# Patient Record
Sex: Female | Born: 1945
Health system: Southern US, Community
[De-identification: ages and names within clinical notes are randomized; demographics above are authoritative.]

## PROBLEM LIST (undated history)

## (undated) DIAGNOSIS — I69391 Dysphagia following cerebral infarction: Secondary | ICD-10-CM

## (undated) DIAGNOSIS — E876 Hypokalemia: Secondary | ICD-10-CM

## (undated) DIAGNOSIS — I10 Essential (primary) hypertension: Secondary | ICD-10-CM

## (undated) DIAGNOSIS — I4891 Unspecified atrial fibrillation: Secondary | ICD-10-CM

## (undated) DIAGNOSIS — M1990 Unspecified osteoarthritis, unspecified site: Secondary | ICD-10-CM

## (undated) DIAGNOSIS — I69398 Other sequelae of cerebral infarction: Secondary | ICD-10-CM

## (undated) DIAGNOSIS — R269 Unspecified abnormalities of gait and mobility: Secondary | ICD-10-CM

## (undated) DIAGNOSIS — G35 Multiple sclerosis: Secondary | ICD-10-CM

## (undated) DIAGNOSIS — Z86718 Personal history of other venous thrombosis and embolism: Secondary | ICD-10-CM

## (undated) DIAGNOSIS — M8580 Other specified disorders of bone density and structure, unspecified site: Secondary | ICD-10-CM

## (undated) DIAGNOSIS — G8191 Hemiplegia, unspecified affecting right dominant side: Secondary | ICD-10-CM

## (undated) DIAGNOSIS — I119 Hypertensive heart disease without heart failure: Secondary | ICD-10-CM

## (undated) DIAGNOSIS — J9601 Acute respiratory failure with hypoxia: Secondary | ICD-10-CM

## (undated) DIAGNOSIS — M5030 Other cervical disc degeneration, unspecified cervical region: Secondary | ICD-10-CM

## (undated) DIAGNOSIS — Z9861 Coronary angioplasty status: Secondary | ICD-10-CM

## (undated) DIAGNOSIS — D649 Anemia, unspecified: Secondary | ICD-10-CM

## (undated) DIAGNOSIS — E7800 Pure hypercholesterolemia, unspecified: Secondary | ICD-10-CM

## (undated) DIAGNOSIS — R0989 Other specified symptoms and signs involving the circulatory and respiratory systems: Secondary | ICD-10-CM

## (undated) DIAGNOSIS — I480 Paroxysmal atrial fibrillation: Secondary | ICD-10-CM

## (undated) DIAGNOSIS — I639 Cerebral infarction, unspecified: Secondary | ICD-10-CM

## (undated) DIAGNOSIS — I2510 Atherosclerotic heart disease of native coronary artery without angina pectoris: Secondary | ICD-10-CM

## (undated) DIAGNOSIS — D72829 Elevated white blood cell count, unspecified: Secondary | ICD-10-CM

## (undated) DIAGNOSIS — K635 Polyp of colon: Secondary | ICD-10-CM

## (undated) HISTORY — DX: Unspecified atrial fibrillation: I48.91

## (undated) HISTORY — PX: WRIST SURGERY: SHX841

## (undated) HISTORY — DX: Personal history of other venous thrombosis and embolism: Z86.718

## (undated) HISTORY — DX: Other specified disorders of bone density and structure, unspecified site: M85.80

## (undated) HISTORY — DX: Polyp of colon: K63.5

## (undated) HISTORY — DX: Essential (primary) hypertension: I10

## (undated) HISTORY — DX: Other cervical disc degeneration, unspecified cervical region: M50.30

## (undated) HISTORY — DX: Other specified symptoms and signs involving the circulatory and respiratory systems: R09.89

## (undated) HISTORY — DX: Elevated white blood cell count, unspecified: D72.829

## (undated) HISTORY — DX: Unspecified osteoarthritis, unspecified site: M19.90

## (undated) HISTORY — DX: Anemia, unspecified: D64.9

## (undated) HISTORY — DX: Acute respiratory failure with hypoxia: J96.01

## (undated) HISTORY — DX: Unspecified abnormalities of gait and mobility: R26.9

## (undated) HISTORY — DX: Other sequelae of cerebral infarction: I69.398

## (undated) HISTORY — DX: Hypokalemia: E87.6

## (undated) HISTORY — PX: NECK SURGERY: SHX720

## (undated) HISTORY — DX: Dysphagia following cerebral infarction: I69.391

## (undated) HISTORY — DX: Hemiplegia, unspecified affecting right dominant side: G81.91

---

## 1998-10-17 ENCOUNTER — Other Ambulatory Visit: Admission: RE | Admit: 1998-10-17 | Discharge: 1998-10-17 | Payer: Self-pay | Admitting: Obstetrics and Gynecology

## 1999-08-03 DIAGNOSIS — I2510 Atherosclerotic heart disease of native coronary artery without angina pectoris: Secondary | ICD-10-CM

## 1999-08-03 HISTORY — DX: Atherosclerotic heart disease of native coronary artery without angina pectoris: I25.10

## 1999-11-01 HISTORY — PX: CORONARY ANGIOPLASTY WITH STENT PLACEMENT: SHX49

## 1999-11-29 ENCOUNTER — Encounter: Payer: Self-pay | Admitting: Emergency Medicine

## 1999-11-29 ENCOUNTER — Emergency Department (HOSPITAL_COMMUNITY): Admission: EM | Admit: 1999-11-29 | Discharge: 1999-11-29 | Payer: Self-pay | Admitting: Emergency Medicine

## 2000-03-17 ENCOUNTER — Other Ambulatory Visit: Admission: RE | Admit: 2000-03-17 | Discharge: 2000-03-17 | Payer: Self-pay | Admitting: Obstetrics and Gynecology

## 2001-01-30 ENCOUNTER — Other Ambulatory Visit: Admission: RE | Admit: 2001-01-30 | Discharge: 2001-01-30 | Payer: Self-pay | Admitting: Obstetrics and Gynecology

## 2001-04-14 ENCOUNTER — Encounter (INDEPENDENT_AMBULATORY_CARE_PROVIDER_SITE_OTHER): Payer: Self-pay | Admitting: Specialist

## 2001-04-14 ENCOUNTER — Ambulatory Visit (HOSPITAL_COMMUNITY): Admission: RE | Admit: 2001-04-14 | Discharge: 2001-04-14 | Payer: Self-pay | Admitting: Gastroenterology

## 2003-12-03 ENCOUNTER — Other Ambulatory Visit: Admission: RE | Admit: 2003-12-03 | Discharge: 2003-12-03 | Payer: Self-pay | Admitting: Internal Medicine

## 2005-02-05 ENCOUNTER — Emergency Department (HOSPITAL_COMMUNITY): Admission: EM | Admit: 2005-02-05 | Discharge: 2005-02-05 | Payer: Self-pay | Admitting: Emergency Medicine

## 2006-01-10 ENCOUNTER — Other Ambulatory Visit: Admission: RE | Admit: 2006-01-10 | Discharge: 2006-01-10 | Payer: Self-pay | Admitting: Internal Medicine

## 2006-07-22 ENCOUNTER — Ambulatory Visit (HOSPITAL_COMMUNITY): Admission: RE | Admit: 2006-07-22 | Discharge: 2006-07-22 | Payer: Self-pay | Admitting: *Deleted

## 2009-03-19 ENCOUNTER — Ambulatory Visit (HOSPITAL_COMMUNITY): Admission: RE | Admit: 2009-03-19 | Discharge: 2009-03-19 | Payer: Self-pay | Admitting: Internal Medicine

## 2012-04-26 ENCOUNTER — Encounter (HOSPITAL_COMMUNITY): Payer: Self-pay | Admitting: Family Medicine

## 2012-04-26 ENCOUNTER — Emergency Department (HOSPITAL_COMMUNITY): Payer: Medicare Other

## 2012-04-26 ENCOUNTER — Emergency Department (HOSPITAL_COMMUNITY)
Admission: EM | Admit: 2012-04-26 | Discharge: 2012-04-26 | Disposition: A | Discharge status: Home / Self Care | Payer: Medicare Other | Attending: Emergency Medicine | Admitting: Emergency Medicine

## 2012-04-26 ENCOUNTER — Encounter (HOSPITAL_COMMUNITY): Payer: Self-pay | Admitting: Emergency Medicine

## 2012-04-26 ENCOUNTER — Emergency Department (INDEPENDENT_AMBULATORY_CARE_PROVIDER_SITE_OTHER)
Admission: EM | Admit: 2012-04-26 | Discharge: 2012-04-26 | Disposition: A | Discharge status: Nursing Home (Includes ICF) | Payer: Medicare Other | Source: Home / Self Care | Attending: Family Medicine | Admitting: Family Medicine

## 2012-04-26 DIAGNOSIS — M5030 Other cervical disc degeneration, unspecified cervical region: Secondary | ICD-10-CM | POA: Insufficient documentation

## 2012-04-26 DIAGNOSIS — R079 Chest pain, unspecified: Secondary | ICD-10-CM | POA: Insufficient documentation

## 2012-04-26 DIAGNOSIS — S301XXA Contusion of abdominal wall, initial encounter: Secondary | ICD-10-CM | POA: Insufficient documentation

## 2012-04-26 DIAGNOSIS — R51 Headache: Secondary | ICD-10-CM | POA: Insufficient documentation

## 2012-04-26 DIAGNOSIS — G35 Multiple sclerosis: Secondary | ICD-10-CM | POA: Insufficient documentation

## 2012-04-26 DIAGNOSIS — Z981 Arthrodesis status: Secondary | ICD-10-CM | POA: Insufficient documentation

## 2012-04-26 DIAGNOSIS — R0602 Shortness of breath: Secondary | ICD-10-CM

## 2012-04-26 DIAGNOSIS — M25569 Pain in unspecified knee: Secondary | ICD-10-CM | POA: Insufficient documentation

## 2012-04-26 DIAGNOSIS — S20219A Contusion of unspecified front wall of thorax, initial encounter: Secondary | ICD-10-CM

## 2012-04-26 DIAGNOSIS — Z79899 Other long term (current) drug therapy: Secondary | ICD-10-CM | POA: Insufficient documentation

## 2012-04-26 HISTORY — DX: Multiple sclerosis: G35

## 2012-04-26 HISTORY — DX: Pure hypercholesterolemia, unspecified: E78.00

## 2012-04-26 LAB — COMPREHENSIVE METABOLIC PANEL
AST: 35 U/L (ref 0–37)
Albumin: 4.3 g/dL (ref 3.5–5.2)
BUN: 19 mg/dL (ref 6–23)
Calcium: 10.3 mg/dL (ref 8.4–10.5)
Chloride: 100 mEq/L (ref 96–112)
Creatinine, Ser: 0.78 mg/dL (ref 0.50–1.10)
Total Bilirubin: 0.3 mg/dL (ref 0.3–1.2)
Total Protein: 7.5 g/dL (ref 6.0–8.3)

## 2012-04-26 LAB — TROPONIN I: Troponin I: <0.3 ng/mL (ref <0.30)

## 2012-04-26 LAB — CBC WITH DIFFERENTIAL/PLATELET
Basophils Absolute: 0 10*3/uL (ref 0.0–0.1)
Basophils Relative: 0 % (ref 0–1)
Eosinophils Absolute: 0.1 10*3/uL (ref 0.0–0.7)
Eosinophils Relative: 1 % (ref 0–5)
HCT: 36.5 % (ref 36.0–46.0)
Hemoglobin: 12.5 g/dL (ref 12.0–15.0)
MCH: 32.2 pg (ref 26.0–34.0)
MCHC: 34.2 g/dL (ref 30.0–36.0)
MCV: 94.1 fL (ref 78.0–100.0)
Monocytes Absolute: 0.6 10*3/uL (ref 0.1–1.0)
Monocytes Relative: 9 % (ref 3–12)
Neutro Abs: 3.8 10*3/uL (ref 1.7–7.7)
RDW: 12.7 % (ref 11.5–15.5)
WBC: 6.4 10*3/uL (ref 4.0–10.5)

## 2012-04-26 MED ORDER — IOHEXOL 300 MG/ML  SOLN
100.0000 mL | Freq: Once | INTRAMUSCULAR | Status: AC | PRN
  Administered 2012-04-26: 100 mL via INTRAVENOUS

## 2012-04-26 MED ORDER — CETIRIZINE-PSEUDOEPHEDRINE ER 5-120 MG PO TB12: 1.0000 | ORAL_TABLET | Freq: Every day | ORAL | Status: DC

## 2012-04-26 MED ORDER — TRAMADOL HCL 50 MG PO TABS: 50.0000 mg | ORAL_TABLET | Freq: Four times a day (QID) | ORAL | Status: DC | PRN
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
## 2012-04-26 NOTE — ED Notes (Signed)
Family at bedside. 

## 2012-04-26 NOTE — ED Notes (Signed)
Brought husband -Kaitlin [name] from lobby to patient's room.
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
## 2012-04-27 NOTE — ED Provider Notes (Signed)
Medical screening examination/treatment/procedure(s) were performed by non-physician practitioner and as supervising physician I was immediately available for consultation/collaboration.   Shah Insley, MD 04/27/12 1419 

## 2013-11-07 DIAGNOSIS — I2510 Atherosclerotic heart disease of native coronary artery without angina pectoris: Secondary | ICD-10-CM | POA: Insufficient documentation

## 2015-11-03 ENCOUNTER — Other Ambulatory Visit: Payer: Self-pay | Admitting: Internal Medicine

## 2015-11-03 DIAGNOSIS — R748 Abnormal levels of other serum enzymes: Secondary | ICD-10-CM

## 2015-11-11 ENCOUNTER — Ambulatory Visit
Admission: RE | Admit: 2015-11-11 | Discharge: 2015-11-11 | Disposition: A | Discharge status: Home / Self Care | Payer: Medicare Other | Source: Ambulatory Visit | Attending: Internal Medicine | Admitting: Internal Medicine

## 2015-11-11 DIAGNOSIS — R748 Abnormal levels of other serum enzymes: Secondary | ICD-10-CM

## 2017-01-30 DIAGNOSIS — I119 Hypertensive heart disease without heart failure: Secondary | ICD-10-CM

## 2017-01-30 DIAGNOSIS — I480 Paroxysmal atrial fibrillation: Secondary | ICD-10-CM

## 2017-01-30 DIAGNOSIS — I639 Cerebral infarction, unspecified: Secondary | ICD-10-CM

## 2017-01-30 HISTORY — DX: Cerebral infarction, unspecified: I63.9

## 2017-01-30 HISTORY — DX: Hypertensive heart disease without heart failure: I11.9

## 2017-01-30 HISTORY — DX: Paroxysmal atrial fibrillation: I48.0

## 2017-02-14 ENCOUNTER — Emergency Department (HOSPITAL_COMMUNITY): Payer: Medicare Other

## 2017-02-14 ENCOUNTER — Encounter (HOSPITAL_COMMUNITY)
Admission: EM | Disposition: A | Discharge status: Rehabilitation Facility | Payer: Self-pay | Source: Home / Self Care | Attending: Neurology

## 2017-02-14 ENCOUNTER — Encounter (HOSPITAL_COMMUNITY): Payer: Self-pay

## 2017-02-14 ENCOUNTER — Emergency Department (HOSPITAL_COMMUNITY): Payer: Medicare Other | Admitting: Anesthesiology

## 2017-02-14 ENCOUNTER — Inpatient Hospital Stay (HOSPITAL_COMMUNITY)
Admission: EM | Admit: 2017-02-14 | Discharge: 2017-02-22 | DRG: 023 | Disposition: A | Discharge status: Rehabilitation Facility | Payer: Medicare Other | Attending: Neurology | Admitting: Neurology

## 2017-02-14 DIAGNOSIS — Z7982 Long term (current) use of aspirin: Secondary | ICD-10-CM

## 2017-02-14 DIAGNOSIS — I639 Cerebral infarction, unspecified: Secondary | ICD-10-CM | POA: Diagnosis present

## 2017-02-14 DIAGNOSIS — R0989 Other specified symptoms and signs involving the circulatory and respiratory systems: Secondary | ICD-10-CM

## 2017-02-14 DIAGNOSIS — Z978 Presence of other specified devices: Secondary | ICD-10-CM

## 2017-02-14 DIAGNOSIS — I480 Paroxysmal atrial fibrillation: Secondary | ICD-10-CM

## 2017-02-14 DIAGNOSIS — I4891 Unspecified atrial fibrillation: Secondary | ICD-10-CM | POA: Diagnosis not present

## 2017-02-14 DIAGNOSIS — I63512 Cerebral infarction due to unspecified occlusion or stenosis of left middle cerebral artery: Secondary | ICD-10-CM | POA: Diagnosis present

## 2017-02-14 DIAGNOSIS — E785 Hyperlipidemia, unspecified: Secondary | ICD-10-CM

## 2017-02-14 DIAGNOSIS — I69353 Hemiplegia and hemiparesis following cerebral infarction affecting right non-dominant side: Secondary | ICD-10-CM | POA: Diagnosis not present

## 2017-02-14 DIAGNOSIS — I2510 Atherosclerotic heart disease of native coronary artery without angina pectoris: Secondary | ICD-10-CM | POA: Diagnosis present

## 2017-02-14 DIAGNOSIS — M7120 Synovial cyst of popliteal space [Baker], unspecified knee: Secondary | ICD-10-CM | POA: Diagnosis present

## 2017-02-14 DIAGNOSIS — R269 Unspecified abnormalities of gait and mobility: Secondary | ICD-10-CM | POA: Diagnosis present

## 2017-02-14 DIAGNOSIS — G4700 Insomnia, unspecified: Secondary | ICD-10-CM | POA: Diagnosis present

## 2017-02-14 DIAGNOSIS — I69391 Dysphagia following cerebral infarction: Secondary | ICD-10-CM | POA: Diagnosis not present

## 2017-02-14 DIAGNOSIS — Z880 Allergy status to penicillin: Secondary | ICD-10-CM

## 2017-02-14 DIAGNOSIS — I119 Hypertensive heart disease without heart failure: Secondary | ICD-10-CM | POA: Diagnosis present

## 2017-02-14 DIAGNOSIS — I63232 Cerebral infarction due to unspecified occlusion or stenosis of left carotid arteries: Secondary | ICD-10-CM | POA: Diagnosis not present

## 2017-02-14 DIAGNOSIS — E782 Mixed hyperlipidemia: Secondary | ICD-10-CM | POA: Diagnosis not present

## 2017-02-14 DIAGNOSIS — Y848 Other medical procedures as the cause of abnormal reaction of the patient, or of later complication, without mention of misadventure at the time of the procedure: Secondary | ICD-10-CM | POA: Diagnosis present

## 2017-02-14 DIAGNOSIS — R7989 Other specified abnormal findings of blood chemistry: Secondary | ICD-10-CM | POA: Diagnosis not present

## 2017-02-14 DIAGNOSIS — Y92230 Patient room in hospital as the place of occurrence of the external cause: Secondary | ICD-10-CM | POA: Diagnosis not present

## 2017-02-14 DIAGNOSIS — J432 Centrilobular emphysema: Secondary | ICD-10-CM | POA: Diagnosis present

## 2017-02-14 DIAGNOSIS — R4789 Other speech disturbances: Secondary | ICD-10-CM | POA: Diagnosis present

## 2017-02-14 DIAGNOSIS — E874 Mixed disorder of acid-base balance: Secondary | ICD-10-CM | POA: Diagnosis present

## 2017-02-14 DIAGNOSIS — R001 Bradycardia, unspecified: Secondary | ICD-10-CM | POA: Diagnosis present

## 2017-02-14 DIAGNOSIS — I959 Hypotension, unspecified: Secondary | ICD-10-CM | POA: Diagnosis not present

## 2017-02-14 DIAGNOSIS — G8191 Hemiplegia, unspecified affecting right dominant side: Secondary | ICD-10-CM | POA: Diagnosis present

## 2017-02-14 DIAGNOSIS — R413 Other amnesia: Secondary | ICD-10-CM | POA: Diagnosis present

## 2017-02-14 DIAGNOSIS — I700 Atherosclerosis of aorta: Secondary | ICD-10-CM | POA: Diagnosis present

## 2017-02-14 DIAGNOSIS — D62 Acute posthemorrhagic anemia: Secondary | ICD-10-CM | POA: Diagnosis not present

## 2017-02-14 DIAGNOSIS — J9601 Acute respiratory failure with hypoxia: Secondary | ICD-10-CM | POA: Diagnosis not present

## 2017-02-14 DIAGNOSIS — I69919 Unspecified symptoms and signs involving cognitive functions following unspecified cerebrovascular disease: Secondary | ICD-10-CM | POA: Diagnosis not present

## 2017-02-14 DIAGNOSIS — I6300 Cerebral infarction due to thrombosis of unspecified precerebral artery: Secondary | ICD-10-CM | POA: Diagnosis not present

## 2017-02-14 DIAGNOSIS — I341 Nonrheumatic mitral (valve) prolapse: Secondary | ICD-10-CM | POA: Diagnosis not present

## 2017-02-14 DIAGNOSIS — E876 Hypokalemia: Secondary | ICD-10-CM | POA: Diagnosis present

## 2017-02-14 DIAGNOSIS — I350 Nonrheumatic aortic (valve) stenosis: Secondary | ICD-10-CM | POA: Diagnosis present

## 2017-02-14 DIAGNOSIS — Z955 Presence of coronary angioplasty implant and graft: Secondary | ICD-10-CM | POA: Diagnosis not present

## 2017-02-14 DIAGNOSIS — R4701 Aphasia: Secondary | ICD-10-CM | POA: Diagnosis present

## 2017-02-14 DIAGNOSIS — I63032 Cerebral infarction due to thrombosis of left carotid artery: Secondary | ICD-10-CM | POA: Diagnosis not present

## 2017-02-14 DIAGNOSIS — R2689 Other abnormalities of gait and mobility: Secondary | ICD-10-CM | POA: Diagnosis not present

## 2017-02-14 DIAGNOSIS — J690 Pneumonitis due to inhalation of food and vomit: Secondary | ICD-10-CM | POA: Diagnosis present

## 2017-02-14 DIAGNOSIS — I63412 Cerebral infarction due to embolism of left middle cerebral artery: Secondary | ICD-10-CM | POA: Diagnosis not present

## 2017-02-14 DIAGNOSIS — I69320 Aphasia following cerebral infarction: Secondary | ICD-10-CM | POA: Diagnosis not present

## 2017-02-14 DIAGNOSIS — D72829 Elevated white blood cell count, unspecified: Secondary | ICD-10-CM | POA: Diagnosis not present

## 2017-02-14 DIAGNOSIS — R29712 NIHSS score 12: Secondary | ICD-10-CM | POA: Diagnosis present

## 2017-02-14 DIAGNOSIS — R471 Dysarthria and anarthria: Secondary | ICD-10-CM | POA: Diagnosis present

## 2017-02-14 DIAGNOSIS — G35 Multiple sclerosis: Secondary | ICD-10-CM | POA: Diagnosis present

## 2017-02-14 DIAGNOSIS — I6522 Occlusion and stenosis of left carotid artery: Secondary | ICD-10-CM | POA: Diagnosis present

## 2017-02-14 DIAGNOSIS — R1312 Dysphagia, oropharyngeal phase: Secondary | ICD-10-CM | POA: Diagnosis present

## 2017-02-14 DIAGNOSIS — Z79899 Other long term (current) drug therapy: Secondary | ICD-10-CM

## 2017-02-14 DIAGNOSIS — I69398 Other sequelae of cerebral infarction: Secondary | ICD-10-CM | POA: Diagnosis not present

## 2017-02-14 DIAGNOSIS — R5383 Other fatigue: Secondary | ICD-10-CM | POA: Diagnosis present

## 2017-02-14 DIAGNOSIS — T7840XA Allergy, unspecified, initial encounter: Secondary | ICD-10-CM | POA: Diagnosis not present

## 2017-02-14 DIAGNOSIS — T45515A Adverse effect of anticoagulants, initial encounter: Secondary | ICD-10-CM | POA: Diagnosis not present

## 2017-02-14 DIAGNOSIS — I69322 Dysarthria following cerebral infarction: Secondary | ICD-10-CM | POA: Diagnosis not present

## 2017-02-14 DIAGNOSIS — J9600 Acute respiratory failure, unspecified whether with hypoxia or hypercapnia: Secondary | ICD-10-CM | POA: Diagnosis not present

## 2017-02-14 DIAGNOSIS — Q251 Coarctation of aorta: Secondary | ICD-10-CM

## 2017-02-14 DIAGNOSIS — I25110 Atherosclerotic heart disease of native coronary artery with unstable angina pectoris: Secondary | ICD-10-CM | POA: Diagnosis not present

## 2017-02-14 HISTORY — PX: RADIOLOGY WITH ANESTHESIA: SHX6223

## 2017-02-14 HISTORY — DX: Coronary angioplasty status: Z98.61

## 2017-02-14 HISTORY — DX: Atherosclerotic heart disease of native coronary artery without angina pectoris: I25.10

## 2017-02-14 HISTORY — DX: Hypertensive heart disease without heart failure: I11.9

## 2017-02-14 HISTORY — DX: Paroxysmal atrial fibrillation: I48.0

## 2017-02-14 HISTORY — DX: Cerebral infarction, unspecified: I63.9

## 2017-02-14 LAB — I-STAT CHEM 8, ED
BUN: 26 mg/dL — ABNORMAL HIGH (ref 6–20)
CALCIUM ION: 1.07 mmol/L — AB (ref 1.15–1.40)
CHLORIDE: 107 mmol/L (ref 101–111)
CREATININE: 1 mg/dL (ref 0.44–1.00)
GLUCOSE: 94 mg/dL (ref 65–99)
HCT: 37 % (ref 36.0–46.0)
Hemoglobin: 12.6 g/dL (ref 12.0–15.0)
Potassium: 4 mmol/L (ref 3.5–5.1)
Sodium: 139 mmol/L (ref 135–145)
TCO2: 20 mmol/L (ref 0–100)

## 2017-02-14 LAB — RAPID URINE DRUG SCREEN, HOSP PERFORMED
Amphetamines: NOT DETECTED
Barbiturates: NOT DETECTED
Benzodiazepines: NOT DETECTED
Cocaine: NOT DETECTED
Opiates: NOT DETECTED
Tetrahydrocannabinol: NOT DETECTED

## 2017-02-14 LAB — URINALYSIS, ROUTINE W REFLEX MICROSCOPIC
BILIRUBIN URINE: NEGATIVE
Glucose, UA: NEGATIVE mg/dL
Hgb urine dipstick: NEGATIVE
Ketones, ur: NEGATIVE mg/dL
Leukocytes, UA: NEGATIVE
NITRITE: NEGATIVE
PROTEIN: NEGATIVE mg/dL
SPECIFIC GRAVITY, URINE: 1.003 — AB (ref 1.005–1.030)
pH: 6 (ref 5.0–8.0)

## 2017-02-14 LAB — CBC
HEMATOCRIT: 37.3 % (ref 36.0–46.0)
HEMOGLOBIN: 12.2 g/dL (ref 12.0–15.0)
MCH: 31.6 pg (ref 26.0–34.0)
MCHC: 32.7 g/dL (ref 30.0–36.0)
MCV: 96.6 fL (ref 78.0–100.0)
Platelets: 203 10*3/uL (ref 150–400)
RBC: 3.86 MIL/uL — AB (ref 3.87–5.11)
RDW: 13.1 % (ref 11.5–15.5)
WBC: 7.6 10*3/uL (ref 4.0–10.5)

## 2017-02-14 LAB — COMPREHENSIVE METABOLIC PANEL
ALK PHOS: 58 U/L (ref 38–126)
ALT: 20 U/L (ref 14–54)
AST: 25 U/L (ref 15–41)
Albumin: 4 g/dL (ref 3.5–5.0)
Anion gap: 11 (ref 5–15)
BILIRUBIN TOTAL: 0.4 mg/dL (ref 0.3–1.2)
BUN: 23 mg/dL — AB (ref 6–20)
CALCIUM: 9.1 mg/dL (ref 8.9–10.3)
CO2: 20 mmol/L — ABNORMAL LOW (ref 22–32)
CREATININE: 0.84 mg/dL (ref 0.44–1.00)
Chloride: 107 mmol/L (ref 101–111)
Glucose, Bld: 96 mg/dL (ref 65–99)
Potassium: 4 mmol/L (ref 3.5–5.1)
Sodium: 138 mmol/L (ref 135–145)
Total Protein: 6.7 g/dL (ref 6.5–8.1)

## 2017-02-14 LAB — DIFFERENTIAL
Basophils Absolute: 0 10*3/uL (ref 0.0–0.1)
Basophils Relative: 0 %
Eosinophils Absolute: 0.2 10*3/uL (ref 0.0–0.7)
Eosinophils Relative: 3 %
LYMPHS ABS: 2.4 10*3/uL (ref 0.7–4.0)
LYMPHS PCT: 31 %
MONO ABS: 0.6 10*3/uL (ref 0.1–1.0)
MONOS PCT: 7 %
NEUTROS ABS: 4.4 10*3/uL (ref 1.7–7.7)
Neutrophils Relative %: 59 %

## 2017-02-14 LAB — PROTIME-INR
INR: 0.94
Prothrombin Time: 12.6 seconds (ref 11.4–15.2)

## 2017-02-14 LAB — I-STAT TROPONIN, ED: TROPONIN I, POC: 0.02 ng/mL (ref 0.00–0.08)

## 2017-02-14 LAB — ETHANOL: ALCOHOL ETHYL (B): 152 mg/dL — AB (ref <5)

## 2017-02-14 LAB — APTT: aPTT: 25 seconds (ref 24–36)

## 2017-02-14 SURGERY — RADIOLOGY WITH ANESTHESIA
Anesthesia: General

## 2017-02-14 MED ORDER — ROCURONIUM BROMIDE 100 MG/10ML IV SOLN
INTRAVENOUS | Status: DC | PRN
  Administered 2017-02-14: 40 mg via INTRAVENOUS
  Administered 2017-02-15: 25 mg via INTRAVENOUS
  Administered 2017-02-15: 10 mg via INTRAVENOUS

## 2017-02-14 MED ORDER — IOPAMIDOL (ISOVUE-370) INJECTION 76%
INTRAVENOUS | Status: AC
  Administered 2017-02-14: 50 mL
  Filled 2017-02-14: qty 50

## 2017-02-14 MED ORDER — IOPAMIDOL (ISOVUE-300) INJECTION 61%
INTRAVENOUS | Status: AC
  Administered 2017-02-15: 150 mL
  Filled 2017-02-14: qty 300

## 2017-02-14 MED ORDER — VENLAFAXINE HCL ER 75 MG PO CP24
75.0000 mg | ORAL_CAPSULE | Freq: Every day | ORAL | Status: DC
  Administered 2017-02-15 – 2017-02-22 (×7): 75 mg via ORAL
  Filled 2017-02-14 (×9): qty 1

## 2017-02-14 MED ORDER — ACETAMINOPHEN 325 MG PO TABS: 650.0000 mg | ORAL_TABLET | Freq: Four times a day (QID) | ORAL | Status: DC | PRN

## 2017-02-14 MED ORDER — GABAPENTIN 300 MG PO CAPS: 300.0000 mg | ORAL_CAPSULE | ORAL | Status: DC | PRN

## 2017-02-14 MED ORDER — PROSIGHT PO TABS
1.0000 | ORAL_TABLET | Freq: Two times a day (BID) | ORAL | Status: DC
  Administered 2017-02-15 – 2017-02-22 (×13): 1 via ORAL
  Filled 2017-02-14 (×15): qty 1

## 2017-02-14 MED ORDER — SODIUM CHLORIDE 0.9 % IV SOLN
INTRAVENOUS | Status: DC | PRN
  Administered 2017-02-14 – 2017-02-15 (×3): via INTRAVENOUS

## 2017-02-14 MED ORDER — EPHEDRINE SULFATE 50 MG/ML IJ SOLN
INTRAMUSCULAR | Status: DC | PRN
  Administered 2017-02-14 (×2): 15 mg via INTRAVENOUS
  Administered 2017-02-15 (×2): 10 mg via INTRAVENOUS

## 2017-02-14 MED ORDER — HEPARIN SODIUM (PORCINE) 1000 UNIT/ML IJ SOLN
INTRAMUSCULAR | Status: DC | PRN
  Administered 2017-02-14: 1000 [IU] via INTRAVENOUS

## 2017-02-14 MED ORDER — LIDOCAINE HCL (CARDIAC) 20 MG/ML IV SOLN
INTRAVENOUS | Status: DC | PRN
  Administered 2017-02-14: 100 mg via INTRATRACHEAL

## 2017-02-14 MED ORDER — CLOPIDOGREL BISULFATE 300 MG PO TABS
ORAL_TABLET | ORAL | Status: AC
  Administered 2017-02-14: 300 mg
  Filled 2017-02-14: qty 1

## 2017-02-14 MED ORDER — FENTANYL CITRATE (PF) 100 MCG/2ML IJ SOLN
INTRAMUSCULAR | Status: DC | PRN
  Administered 2017-02-14 – 2017-02-15 (×2): 100 ug via INTRAVENOUS

## 2017-02-14 MED ORDER — CLOPIDOGREL BISULFATE 300 MG PO TABS
300.0000 mg | ORAL_TABLET | Freq: Once | ORAL | Status: AC
  Administered 2017-02-14: 300 mg

## 2017-02-14 MED ORDER — FLUOXETINE HCL 10 MG PO CAPS
10.0000 mg | ORAL_CAPSULE | Freq: Every day | ORAL | Status: DC
  Administered 2017-02-15 – 2017-02-22 (×6): 10 mg via ORAL
  Filled 2017-02-14 (×9): qty 1

## 2017-02-14 MED ORDER — SENNOSIDES-DOCUSATE SODIUM 8.6-50 MG PO TABS
1.0000 | ORAL_TABLET | Freq: Every evening | ORAL | Status: DC | PRN
  Filled 2017-02-14: qty 1

## 2017-02-14 MED ORDER — ASPIRIN 325 MG PO TABS
325.0000 mg | ORAL_TABLET | Freq: Once | ORAL | Status: AC
  Administered 2017-02-14: 325 mg

## 2017-02-14 MED ORDER — PROPOFOL 10 MG/ML IV BOLUS
INTRAVENOUS | Status: DC | PRN
  Administered 2017-02-14: 160 mg via INTRAVENOUS

## 2017-02-14 MED ORDER — SODIUM CHLORIDE 0.9 % IV SOLN
INTRAVENOUS | Status: AC
  Administered 2017-02-15: 04:00:00 via INTRAVENOUS

## 2017-02-14 MED ORDER — SODIUM CHLORIDE 0.9 % IV SOLN
INTRAVENOUS | Status: DC | PRN
  Administered 2017-02-14: 1000 mg via INTRAVENOUS

## 2017-02-14 MED ORDER — VANCOMYCIN HCL IN DEXTROSE 1-5 GM/200ML-% IV SOLN
INTRAVENOUS | Status: AC
  Filled 2017-02-14: qty 200

## 2017-02-14 MED ORDER — CALCIUM CITRATE-VITAMIN D 315-200 MG-UNIT PO TABS: 1.0000 | ORAL_TABLET | Freq: Every day | ORAL | Status: DC

## 2017-02-14 MED ORDER — ENOXAPARIN SODIUM 40 MG/0.4ML ~~LOC~~ SOLN
40.0000 mg | SUBCUTANEOUS | Status: DC
  Administered 2017-02-16 – 2017-02-19 (×4): 40 mg via SUBCUTANEOUS
  Filled 2017-02-14 (×4): qty 0.4

## 2017-02-14 MED ORDER — STROKE: EARLY STAGES OF RECOVERY BOOK
Freq: Once | Status: AC
  Administered 2017-02-15: 05:00:00
  Filled 2017-02-14: qty 1

## 2017-02-14 MED ORDER — EPTIFIBATIDE 20 MG/10ML IV SOLN
INTRAVENOUS | Status: AC
  Administered 2017-02-14: 1.8 mg
  Administered 2017-02-15: 0.8 ug
  Administered 2017-02-15: 1.8 ug
  Administered 2017-02-15: 1.8 mg
  Administered 2017-02-15: 0.9 mg
  Filled 2017-02-14: qty 10

## 2017-02-14 MED ORDER — SUCCINYLCHOLINE 20MG/ML (10ML) SYRINGE FOR MEDFUSION PUMP - OPTIME
INTRAMUSCULAR | Status: DC | PRN
  Administered 2017-02-14: 120 mg via INTRAVENOUS

## 2017-02-14 MED ORDER — NITROGLYCERIN 1 MG/10 ML FOR IR/CATH LAB
INTRA_ARTERIAL | Status: AC
  Administered 2017-02-15 (×2): 25 ug
  Filled 2017-02-14: qty 10

## 2017-02-14 MED ORDER — PRAVASTATIN SODIUM 10 MG PO TABS: 10.0000 mg | ORAL_TABLET | Freq: Every day | ORAL | Status: DC

## 2017-02-14 MED ORDER — ASPIRIN 325 MG PO TABS
ORAL_TABLET | ORAL | Status: AC
  Administered 2017-02-14: 325 mg
  Filled 2017-02-14: qty 1

## 2017-02-14 MED ORDER — PHENYLEPHRINE HCL 10 MG/ML IJ SOLN
INTRAVENOUS | Status: DC | PRN
  Administered 2017-02-14: 50 ug/min via INTRAVENOUS

## 2017-02-14 NOTE — Anesthesia Preprocedure Evaluation (Signed)
Anesthesia Evaluation  Patient identified by MRN, date of birth, ID band Patient awake    Reviewed: Allergy & Precautions, NPO status , Patient's Chart, lab work & pertinent test results  History of Anesthesia Complications Negative for: history of anesthetic complications  Airway Mallampati: III  TM Distance: >3 FB     Dental  (+) Poor Dentition   Pulmonary neg pulmonary ROS,    Pulmonary exam normal        Cardiovascular + CAD and + Cardiac Stents  Normal cardiovascular exam     Neuro/Psych Multiple Sclerosis CVA negative psych ROS   GI/Hepatic negative GI ROS, Neg liver ROS,   Endo/Other  negative endocrine ROS  Renal/GU negative Renal ROS  negative genitourinary   Musculoskeletal negative musculoskeletal ROS (+)   Abdominal   Peds negative pediatric ROS (+)  Hematology negative hematology ROS (+)   Anesthesia Other Findings   Reproductive/Obstetrics negative OB ROS                             Anesthesia Physical Anesthesia Plan  ASA: IV and emergent  Anesthesia Plan: General   Post-op Pain Management:    Induction: Intravenous, Rapid sequence and Cricoid pressure planned  PONV Risk Score and Plan: 3 and Ondansetron, Dexamethasone and Treatment may vary due to age or medical condition  Airway Management Planned: Oral ETT  Additional Equipment: Arterial line  Intra-op Plan:   Post-operative Plan: Possible Post-op intubation/ventilation  Informed Consent: I have reviewed the patients History and Physical, chart, labs and discussed the procedure including the risks, benefits and alternatives for the proposed anesthesia with the patient or authorized representative who has indicated his/her understanding and acceptance.     Plan Discussed with: CRNA and Anesthesiologist  Anesthesia Plan Comments:         Anesthesia Quick Evaluation

## 2017-02-14 NOTE — ED Provider Notes (Signed)
[facility]-EMERGENCY DEPT Provider Note   CSN: [id number] Arrival date & time: 02/14/17  2024     History   Chief Complaint Chief Complaint  Patient presents with  . Code Stroke    HPI Kaitlin [name] is a 71 y.o. female.   Patient's 71 year old female with past medical history significant for multiple sclerosis and cholesterol. Patient's setting today with stuttering symptoms. Patient started 11 AM had difficulty speaking. This somewhat resolved. Patient had some weakness the right-hand husband helped her into the shower. Patient ate dinner normally, but then started having right sided weakness that was increasing and sporadic difficulty speaking.  HPI  Past Medical History:  Diagnosis Date  . High cholesterol   . Multiple sclerosis (HCC)     There are no active problems to display for this patient.   Past Surgical History:  Procedure Laterality Date  . CORONARY ANGIOPLASTY WITH STENT PLACEMENT    . NECK SURGERY    . WRIST SURGERY      OB History    No data available       Home Medications    Prior to Admission medications   Medication Sig Start Date End Date Taking? Authorizing Provider  aspirin 325 MG tablet Take 325 mg by mouth daily.    [provider]  FLUoxetine (PROZAC) 10 MG capsule Take 10 mg by mouth daily.    [provider]  interferon beta-1b (BETASERON) 0.3 MG injection Inject 0.25 mg into the skin every other day.    [provider]  pravastatin (PRAVACHOL) 20 MG tablet Take 20 mg by mouth daily.    [provider]  traMADol (ULTRAM) 50 MG tablet Take 1 tablet (50 mg total) by mouth every 6 (six) hours as needed for pain. 04/26/12   Ivonne Andrew, PA-C  venlafaxine XR (EFFEXOR-XR) 75 MG 24 hr capsule Take 75 mg by mouth daily.    [provider]    Family History No family history on file.  Social History Social History  Substance Use Topics  . Smoking status: Never Smoker  . Smokeless tobacco: Never  Used  . Alcohol use 1.8 oz/week    3 Glasses of wine per week     Allergies   Penicillins   Review of Systems Review of Systems  Constitutional: Negative for activity change.  Respiratory: Negative for shortness of breath.   Cardiovascular: Negative for chest pain.  Gastrointestinal: Negative for abdominal pain.  Neurological: Positive for speech difficulty, weakness and light-headedness.  All other systems reviewed and are negative.    Physical Exam Updated Vital Signs BP (!) 180/75 (BP Location: Right Arm)   Pulse 77   Temp 98.1 F (36.7 C) (Oral)   Resp 16   Ht 5\' 2"  (1.575 m)   Wt 56.2 kg (124 lb)   SpO2 98%   BMI 22.68 kg/m   Physical Exam  Constitutional: She is oriented to person, place, and time. She appears well-developed and well-nourished.  HENT:  Head: Normocephalic and atraumatic.  Eyes: Right eye exhibits no discharge.  Cardiovascular: Normal rate, regular rhythm and normal heart sounds.   No murmur heard. Pulmonary/Chest: Effort normal and breath sounds normal. She has no wheezes. She has no rales.  Abdominal: Soft. She exhibits no distension. There is no tenderness.  Neurological: She is oriented to person, place, and time.  Some speech slowing. Right pronator drift.  Skin: Skin is warm and dry. She is not diaphoretic.  Psychiatric: She has a normal mood and  affect.  Nursing note and vitals reviewed.    ED Treatments / Results  Labs (all labs ordered are listed, but only abnormal results are displayed) Labs Reviewed  ETHANOL - Abnormal; Notable for the following:       Result Value   Alcohol, Ethyl (B) 152 (*)    All other components within normal limits  CBC - Abnormal; Notable for the following:    RBC 3.86 (*)    All other components within normal limits  COMPREHENSIVE METABOLIC PANEL - Abnormal; Notable for the following:    CO2 20 (*)    BUN 23 (*)    All other components within normal limits  I-STAT CHEM 8, ED - Abnormal; Notable  for the following:    BUN 26 (*)    Calcium, Ion 1.07 (*)    All other components within normal limits  PROTIME-INR  APTT  DIFFERENTIAL  RAPID URINE DRUG SCREEN, HOSP PERFORMED  URINALYSIS, ROUTINE W REFLEX MICROSCOPIC  I-STAT TROPOININ, ED    EKG  EKG Interpretation  Date/Time:  Monday February 14 2017 21:09:45 EDT Ventricular Rate:  80 PR Interval:    QRS Duration: 93 QT Interval:  391 QTC Calculation: 451 R Axis:   31 Text Interpretation:  Sinus rhythm Probable left atrial enlargement RSR' in V1 or V2, right VCD or RVH Repol abnrm suggests ischemia, anterolateral lateral T wave inversions. Confirmed by Bary Castilla ([id number]) on 02/14/2017 9:14:13 PM       Radiology Ct Angio Head W Or Wo Contrast  Result Date: 02/14/2017 CLINICAL DATA:  Code stroke. 71 y/o F; slurred speech and right-sided weakness. EXAM: CT ANGIOGRAPHY HEAD AND NECK TECHNIQUE: Multidetector CT imaging of the head and neck was performed using the standard protocol during bolus administration of intravenous contrast. Multiplanar CT image reconstructions and MIPs were obtained to evaluate the vascular anatomy. Carotid stenosis measurements (when applicable) are obtained utilizing NASCET criteria, using the distal internal carotid diameter as the denominator. CONTRAST:  50 cc Isovue 370 COMPARISON:  04/26/2012 CT head FINDINGS: CT HEAD FINDINGS Brain: Focal loss of gray-white differentiation in the left insula (series 5, image 30) hypoattenuation within left posterior putamen. Stable background of moderate chronic microvascular ischemic changes the brain and mild brain parenchymal volume loss. No evidence for intracranial hemorrhage, hydrocephalus, or mass effect. Vascular: Left distal M1 hyperdensity (series 5, image 28). Skull: Normal. Negative for fracture or focal lesion. Sinuses/Orbits: No acute finding. Other: None. ASPECTS [facility] Stroke Program Early CT Score) - Ganglionic level infarction (caudate, lentiform  nuclei, internal capsule, insula, M1-M3 cortex): 5 - Supraganglionic infarction (M4-M6 cortex): 3 Total score (0-10 with 10 being normal): 8 CTA NECK FINDINGS Aortic arch: Standard branching. Imaged portion shows no evidence of aneurysm or dissection. No significant stenosis of the major arch vessel origins. Moderate calcific atherosclerosis. Right carotid system: No evidence of dissection, stenosis (50% or greater) or occlusion. Mild calcific atherosclerosis of the bifurcation. 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 Patent left external carotid artery, likely via collateralization. Vertebral arteries: Right dominant. No evidence of dissection, stenosis (50% or greater) or occlusion. Skeleton: C4 through C7 anterior fusion and discectomy. Hardware appears intact without apparent hardware related complication. Multilevel prominent facet arthrosis. No high-grade bony canal stenosis. Other neck: Negative. Upper chest: Moderate centrilobular emphysema and mild biapical pleuroparenchymal scarring. Review of the MIP images confirms the above findings CTA HEAD FINDINGS Anterior circulation: Occlusion of the internal carotid artery  to the distal cavernous segment. Paraclinoid and terminal left ICA segments are patent as is the very proximal left M1 likely due to blood flow via a diminutive left posterior communicating artery. Left mid and distal M1 occlusion. Poor left MCA distribution collateralization. Left A1 occlusion. Patent bilateral A2 and symmetric ACA circulation. The left distal ACA is likely perfused via the anterior communicating artery. Patent right MCA. Posterior circulation: No significant stenosis, proximal occlusion, aneurysm, or vascular malformation. The segments of mild stenosis in the left V4 segment. Venous sinuses: As permitted by contrast timing, patent. Anatomic variants: Bilateral posterior and anterior communicating artery  are present. Delayed phase: No abnormal intracranial enhancement. Review of the MIP images confirms the above findings IMPRESSION: CT head: 1. Focal hypodensity in left insula and left posterior putamen compatible with infarction. 2. Hyperdensity and distal left M1. 3. ASPECTS is 8 CTA neck: 1. Occlusion of the left common and internal carotid artery from the arch to the distal cavernous segment. 2. Patent right common carotid system and vertebral arteries without dissection, aneurysm, or significant stenosis. 3. Moderate calcific atherosclerosis of the aortic arch 4. Centrilobular emphysema of lung apices. CTA head: 1. Patent paraclinoid and terminal segments of left internal carotid artery and minimal flow in proximal left M1 likely retrograde via a tiny left posterior communicating artery. 2. Occlusion of mid and distal left M1 with poor left MCA collateralization. 3. Left A1 occlusion. Bilateral A2 are patent with the left likely perfused via the anterior communicating artery. Symmetric distal ACA circulation. 4. Patent right MCA and posterior circulation. 5. No intracranial aneurysm identified. These results were called by telephone at the time of interpretation on 02/14/2017 at 8:56 pm to Dr. Doug Sou ; [facility] , who verbally acknowledged these results. Electronically Signed   By: Mitzi Hansen M.D.   On: 02/14/2017 21:08   Ct Angio Neck W Or Wo Contrast  Result Date: 02/14/2017 CLINICAL DATA:  Code stroke. 71 y/o F; slurred speech and right-sided weakness. EXAM: CT ANGIOGRAPHY HEAD AND NECK TECHNIQUE: Multidetector CT imaging of the head and neck was performed using the standard protocol during bolus administration of intravenous contrast. Multiplanar CT image reconstructions and MIPs were obtained to evaluate the vascular anatomy. Carotid stenosis measurements (when applicable) are obtained utilizing NASCET criteria, using the distal internal carotid diameter as the denominator.  CONTRAST:  50 cc Isovue 370 COMPARISON:  04/26/2012 CT head FINDINGS: CT HEAD FINDINGS Brain: Focal loss of gray-white differentiation in the left insula (series 5, image 30) hypoattenuation within left posterior putamen. Stable background of moderate chronic microvascular ischemic changes the brain and mild brain parenchymal volume loss. No evidence for intracranial hemorrhage, hydrocephalus, or mass effect. Vascular: Left distal M1 hyperdensity (series 5, image 28). Skull: Normal. Negative for fracture or focal lesion. Sinuses/Orbits: No acute finding. Other: None. ASPECTS [facility] Stroke Program Early CT Score) - Ganglionic level infarction (caudate, lentiform nuclei, internal capsule, insula, M1-M3 cortex): 5 - Supraganglionic infarction (M4-M6 cortex): 3 Total score (0-10 with 10 being normal): 8 CTA NECK FINDINGS Aortic arch: Standard branching. Imaged portion shows no evidence of aneurysm or dissection. No significant stenosis of the major arch vessel origins. Moderate calcific atherosclerosis. Right carotid system: No evidence of dissection, stenosis (50% or greater) or occlusion. Mild calcific atherosclerosis of the bifurcation. 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 Patent left external carotid artery, likely via collateralization. Vertebral arteries: Right dominant. No evidence of dissection,  stenosis (50% or greater) or occlusion. Skeleton: C4 through C7 anterior fusion and discectomy. Hardware appears intact without apparent hardware related complication. Multilevel prominent facet arthrosis. No high-grade bony canal stenosis. Other neck: Negative. Upper chest: Moderate centrilobular emphysema and mild biapical pleuroparenchymal scarring. Review of the MIP images confirms the above findings CTA HEAD FINDINGS Anterior circulation: Occlusion of the internal carotid artery to the distal cavernous segment. Paraclinoid and terminal left  ICA segments are patent as is the very proximal left M1 likely due to blood flow via a diminutive left posterior communicating artery. Left mid and distal M1 occlusion. Poor left MCA distribution collateralization. Left A1 occlusion. Patent bilateral A2 and symmetric ACA circulation. The left distal ACA is likely perfused via the anterior communicating artery. Patent right MCA. Posterior circulation: No significant stenosis, proximal occlusion, aneurysm, or vascular malformation. The segments of mild stenosis in the left V4 segment. Venous sinuses: As permitted by contrast timing, patent. Anatomic variants: Bilateral posterior and anterior communicating artery are present. Delayed phase: No abnormal intracranial enhancement. Review of the MIP images confirms the above findings IMPRESSION: CT head: 1. Focal hypodensity in left insula and left posterior putamen compatible with infarction. 2. Hyperdensity and distal left M1. 3. ASPECTS is 8 CTA neck: 1. Occlusion of the left common and internal carotid artery from the arch to the distal cavernous segment. 2. Patent right common carotid system and vertebral arteries without dissection, aneurysm, or significant stenosis. 3. Moderate calcific atherosclerosis of the aortic arch 4. Centrilobular emphysema of lung apices. CTA head: 1. Patent paraclinoid and terminal segments of left internal carotid artery and minimal flow in proximal left M1 likely retrograde via a tiny left posterior communicating artery. 2. Occlusion of mid and distal left M1 with poor left MCA collateralization. 3. Left A1 occlusion. Bilateral A2 are patent with the left likely perfused via the anterior communicating artery. Symmetric distal ACA circulation. 4. Patent right MCA and posterior circulation. 5. No intracranial aneurysm identified. These results were called by telephone at the time of interpretation on 02/14/2017 at 8:56 pm to Dr. Doug Sou ; [facility] , who verbally acknowledged these  results. Electronically Signed   By: Mitzi Hansen M.D.   On: 02/14/2017 21:08   Ct Head Code Stroke W/o Cm  Result Date: 02/14/2017 CLINICAL DATA:  Code stroke. 71 y/o F; slurred speech and right-sided weakness. EXAM: CT ANGIOGRAPHY HEAD AND NECK TECHNIQUE: Multidetector CT imaging of the head and neck was performed using the standard protocol during bolus administration of intravenous contrast. Multiplanar CT image reconstructions and MIPs were obtained to evaluate the vascular anatomy. Carotid stenosis measurements (when applicable) are obtained utilizing NASCET criteria, using the distal internal carotid diameter as the denominator. CONTRAST:  50 cc Isovue 370 COMPARISON:  04/26/2012 CT head FINDINGS: CT HEAD FINDINGS Brain: Focal loss of gray-white differentiation in the left insula (series 5, image 30) hypoattenuation within left posterior putamen. Stable background of moderate chronic microvascular ischemic changes the brain and mild brain parenchymal volume loss. No evidence for intracranial hemorrhage, hydrocephalus, or mass effect. Vascular: Left distal M1 hyperdensity (series 5, image 28). Skull: Normal. Negative for fracture or focal lesion. Sinuses/Orbits: No acute finding. Other: None. ASPECTS [facility] Stroke Program Early CT Score) - Ganglionic level infarction (caudate, lentiform nuclei, internal capsule, insula, M1-M3 cortex): 5 - Supraganglionic infarction (M4-M6 cortex): 3 Total score (0-10 with 10 being normal): 8 CTA NECK FINDINGS Aortic arch: Standard branching. Imaged portion shows no evidence of aneurysm or dissection. No significant  stenosis of the major arch vessel origins. Moderate calcific atherosclerosis. Right carotid system: No evidence of dissection, stenosis (50% or greater) or occlusion. Mild calcific atherosclerosis of the bifurcation. 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 Patent left external  carotid artery, likely via collateralization. Vertebral arteries: Right dominant. No evidence of dissection, stenosis (50% or greater) or occlusion. Skeleton: C4 through C7 anterior fusion and discectomy. Hardware appears intact without apparent hardware related complication. Multilevel prominent facet arthrosis. No high-grade bony canal stenosis. Other neck: Negative. Upper chest: Moderate centrilobular emphysema and mild biapical pleuroparenchymal scarring. Review of the MIP images confirms the above findings CTA HEAD FINDINGS Anterior circulation: Occlusion of the internal carotid artery to the distal cavernous segment. Paraclinoid and terminal left ICA segments are patent as is the very proximal left M1 likely due to blood flow via a diminutive left posterior communicating artery. Left mid and distal M1 occlusion. Poor left MCA distribution collateralization. Left A1 occlusion. Patent bilateral A2 and symmetric ACA circulation. The left distal ACA is likely perfused via the anterior communicating artery. Patent right MCA. Posterior circulation: No significant stenosis, proximal occlusion, aneurysm, or vascular malformation. The segments of mild stenosis in the left V4 segment. Venous sinuses: As permitted by contrast timing, patent. Anatomic variants: Bilateral posterior and anterior communicating artery are present. Delayed phase: No abnormal intracranial enhancement. Review of the MIP images confirms the above findings IMPRESSION: CT head: 1. Focal hypodensity in left insula and left posterior putamen compatible with infarction. 2. Hyperdensity and distal left M1. 3. ASPECTS is 8 CTA neck: 1. Occlusion of the left common and internal carotid artery from the arch to the distal cavernous segment. 2. Patent right common carotid system and vertebral arteries without dissection, aneurysm, or significant stenosis. 3. Moderate calcific atherosclerosis of the aortic arch 4. Centrilobular emphysema of lung apices. CTA  head: 1. Patent paraclinoid and terminal segments of left internal carotid artery and minimal flow in proximal left M1 likely retrograde via a tiny left posterior communicating artery. 2. Occlusion of mid and distal left M1 with poor left MCA collateralization. 3. Left A1 occlusion. Bilateral A2 are patent with the left likely perfused via the anterior communicating artery. Symmetric distal ACA circulation. 4. Patent right MCA and posterior circulation. 5. No intracranial aneurysm identified. These results were called by telephone at the time of interpretation on 02/14/2017 at 8:56 pm to Dr. Doug Sou ; [facility] , who verbally acknowledged these results. Electronically Signed   By: Mitzi Hansen M.D.   On: 02/14/2017 21:08    Procedures Procedures (including critical care time)  Medications Ordered in ED Medications  iopamidol (ISOVUE-370) 76 % injection (50 mLs  Contrast Given 02/14/17 2046)     Initial Impression / Assessment and Plan / ED Course  I have reviewed the triage vital signs and the nursing notes.  Pertinent labs & imaging results that were available during my care of the patient were reviewed by me and considered in my medical decision making (see chart for details).     Patient's 71 year old female with past medical history significant for multiple sclerosis and cholesterol presenting today with strokelike symptoms. CTA shows ICA occlusion exam into M1. We'll do CT perfusion. Neurology involved.  CT perfusion decided that IR will get involved.   CRITICAL CARE Performed by: Arlana Hove Total critical care time: 30 minutes Critical care time was exclusive of separately billable procedures and treating other patients.  Critical care was necessary to treat or prevent imminent or life-threatening deterioration. Critical care was time spent personally by me on the following activities: development of treatment plan with patient and/or surrogate as well as  nursing, discussions with consultants, evaluation of patient's response to treatment, examination of patient, obtaining history from patient or surrogate, ordering and performing treatments and interventions, ordering and review of laboratory studies, ordering and review of radiographic studies, pulse oximetry and re-evaluation of patient's condition.   Final Clinical Impressions(s) / ED Diagnoses   Final diagnoses:  Stroke (cerebrum) [facility])    New Prescriptions New Prescriptions   No medications on file     Abelino Derrick, MD 02/14/17 2302

## 2017-02-14 NOTE — ED Notes (Signed)
Husband at bedside reports they were working in the yard around 11 this morning and that the patient "got very hot and could not talk" he then reports she took a shower by herself and ate lunch and was talking at lunch. He reports she had a nap then around 1700 they had a few glasses of wine and he noticed she couldn't use her well and it "was hard for her to get up" around 1900 with weakness on the right side.

## 2017-02-14 NOTE — Anesthesia Procedure Notes (Signed)
Arterial Line Insertion Start/End7/16/2018 11:00 PM, 02/14/2017 11:08 PM Performed by: Molli Hazard, CRNA  Patient location: OR. Right, radial was placed Catheter size: 20 G Hand hygiene performed  and maximum sterile barriers used   Attempts: 1 Procedure performed without using ultrasound guided technique. Following insertion, dressing applied and Biopatch.

## 2017-02-14 NOTE — ED Notes (Signed)
Neuro at bedside speaking with patient about options for care

## 2017-02-14 NOTE — ED Notes (Signed)
Bilateral groins shaved.

## 2017-02-14 NOTE — Anesthesia Procedure Notes (Addendum)
Procedure Name: Intubation Date/Time: 02/14/2017 10:52 PM Performed by: Molli Hazard Pre-anesthesia Checklist: Patient identified, Emergency Drugs available, Suction available and Patient being monitored Patient Re-evaluated:Patient Re-evaluated prior to induction Oxygen Delivery Method: Circle system utilized Preoxygenation: Pre-oxygenation with 100% oxygen Induction Type: IV induction, Rapid sequence and Cricoid Pressure applied Laryngoscope Size: McGraph Grade View: Grade III Tube type: Subglottic suction tube Tube size: 7.5 mm Number of attempts: 3 Airway Equipment and Method: Stylet,  Bougie stylet and Video-laryngoscopy Placement Confirmation: ETT inserted through vocal cords under direct vision,  positive ETCO2 and breath sounds checked- equal and bilateral Secured at: 22 cm Tube secured with: Tape Dental Injury: Teeth and Oropharynx as per pre-operative assessment  Difficulty Due To: Difficulty was unanticipated and Difficult Airway- due to anterior larynx Future Recommendations: Recommend- induction with short-acting agent, and alternative techniques readily available Comments: DL x 1 by Toney Sang, CRNA with Hyacinth Meeker 2; able to visualize epiglottis only. Mask ventilation (maintaining cricoid pressure throughout). DL x 1 by Dr Krista Blue with Hyacinth Meeker 2 - esophageal intubation. Video laryngoscopy by Arlice Colt, CRNA, with McGraph - epiglottis and arytenoids visible - Bougie stylette used and trachea intubated. Pt stable throughout.

## 2017-02-14 NOTE — ED Notes (Signed)
ED Provider at bedside. 

## 2017-02-14 NOTE — ED Triage Notes (Signed)
Pt BIB [facility] from home where pt husband reports the pt got "hot today around 11 while working in the garden and her speech was off and she had to sit down". Husband reports she was able to take her own shower and her speech started to better.

## 2017-02-14 NOTE — H&P (Signed)
Admission H&P    Chief Complaint: Garbled speech  HPI: Kaitlin [name] is an 71 y.o. female with a 20 year history of MS who presented as a Code Stroke for right sided weakness and garbled speech. Time of symptom onset was 11 AM while working in the yard, at which point she began to feel very hot and could not speak. She took a shower and symptoms seemed almost completely to resolve per husband. Subsequently, she had a nap and then at about 1700 she and her husband had a few glasses of wine, at which point he noticed that she was having some difficulty using her right side and it "was hard for her to get up", the latter symptom noted at about 1900.  CT head showed focal hypodensity in left insula and left posterior putamen compatible with infarction with ASPECTS of 8. Also noted was hyperdensity of the distal left M1.  CTA neck showed the following: 1. Occlusion of the left common and internal carotid artery from the arch to the distal cavernous segment. 2. Patent right common carotid system and vertebral arteries without dissection, aneurysm, or significant stenosis. 3. Moderate calcific atherosclerosis of the aortic arch  CTA head showed the following: 1. Patent paraclinoid and terminal segments of left internal carotid artery and minimal flow in proximal left M1 likely retrograde via a tiny left posterior communicating artery. 2. Occlusion of mid and distal left M1 with poor left MCA collateralization. 3. Left A1 occlusion. Bilateral A2 are patent with the left likely perfused via the anterior communicating artery. Symmetric distal ACA circulation. 4. Patent right MCA and posterior circulation. 5. No intracranial aneurysm identified.  LSN: 11 AM tPA Given: No: Out of time window  Of note, she has not had an MS exacerbation in several years and was recently taken off of Betaseron.   Past Medical History:  Diagnosis Date  . High cholesterol   . Multiple sclerosis ([place])     Past Surgical  History:  Procedure Laterality Date  . CORONARY ANGIOPLASTY WITH STENT PLACEMENT    . NECK SURGERY    . WRIST SURGERY      No family history on file. Social History:  reports that she has never smoked. She has never used smokeless tobacco. She reports that she drinks about 1.8 oz of alcohol per week . She reports that she does not use drugs.  Allergies:  Allergies  Allergen Reactions  . Penicillins    Home medications: Current Meds  Medication Sig  . aspirin EC 81 MG tablet Take 81 mg by mouth daily.  . Calcium Citrate-Vitamin D (CALCIUM CITRATE + D PO) Take 1 tablet by mouth daily.  Marland Kitchen FLUoxetine (PROZAC) 10 MG capsule Take 10 mg by mouth daily.  Marland Kitchen gabapentin (NEURONTIN) 300 MG capsule Take 300 mg by mouth as needed.  . Multiple Vitamins-Minerals (PRESERVISION AREDS PO) Take 1 tablet by mouth 2 (two) times daily.  Marland Kitchen omega-3 acid ethyl esters (LOVAZA) 1 g capsule Take 1 g by mouth 2 (two) times daily. 1200 mg  . pravastatin (PRAVACHOL) 10 MG tablet Take 10 mg by mouth daily.   Marland Kitchen venlafaxine XR (EFFEXOR-XR) 75 MG 24 hr capsule Take 75 mg by mouth daily.    ROS: No headache, vision change, facial droop, chest pain, abdominal pain or limb pain. Other ROS as per HPI.  Physical Examination: Blood pressure (!) 152/56, pulse 73, temperature 98.1 F (36.7 C), temperature source Oral, resp. rate 17, height _0  (1.575 m), weight  56.2 kg (124 lb), SpO2 95 %.  HEENT-  [place]/AT  Lungs - Respirations unlabored Abdomen - Non-distended  Neurologic Examination: Mental Status: Alert and oriented to self, city and state, but not day of week or year. Occasionally with nonsensical speech output, alternating with clear replies to questions. Repetition intact. Naming for common objects intact. No dysarthria. Able to follow all commands.  Cranial Nerves: II:  Visual fields intact. PERRL.   III,IV, VI: No ptosis. EOMI. No nystagmus. Saccadic visual pursuits noted.  V,VII: Smile symmetric. Facial  temperature sensation normal bilaterally VIII: hearing intact to conversation IX,X: No hypophonia XI: bilateral shoulder shrug without asymmetry XII: midline tongue extension Motor: RUE: 4/5 with subtle pronator drift RLE: 4+/5 LUE and LLE 5/5 Sensory: Decreased temperature and FT sensation to RUE and RLE  Deep Tendon Reflexes:  Diffuse hyperreflexia, lower extremities worse than upper extremities. No asymmetry. Toes upgoing bilaterally.  Cerebellar: No ataxia with FNF bilaterally Gait: Deferred  Results for orders placed or performed during the hospital encounter of 02/14/17 (from the past 48 hour(s))  Ethanol     Status: Abnormal   Collection Time: 02/14/17  8:30 PM  Result Value Ref Range   Alcohol, Ethyl (B) 152 (H) <5 mg/dL    Comment:        LOWEST DETECTABLE LIMIT FOR SERUM ALCOHOL IS 5 mg/dL FOR MEDICAL PURPOSES ONLY   Protime-INR     Status: None   Collection Time: 02/14/17  8:30 PM  Result Value Ref Range   Prothrombin Time 12.6 11.4 - 15.2 seconds   INR 0.94   APTT     Status: None   Collection Time: 02/14/17  8:30 PM  Result Value Ref Range   aPTT 25 24 - 36 seconds  CBC     Status: Abnormal   Collection Time: 02/14/17  8:30 PM  Result Value Ref Range   WBC 7.6 4.0 - 10.5 K/uL   RBC 3.86 (L) 3.87 - 5.11 MIL/uL   Hemoglobin 12.2 12.0 - 15.0 g/dL   HCT 37.3 36.0 - 46.0 %   MCV 96.6 78.0 - 100.0 fL   MCH 31.6 26.0 - 34.0 pg   MCHC 32.7 30.0 - 36.0 g/dL   RDW 13.1 11.5 - 15.5 %   Platelets 203 150 - 400 K/uL  Differential     Status: None   Collection Time: 02/14/17  8:30 PM  Result Value Ref Range   Neutrophils Relative % 59 %   Neutro Abs 4.4 1.7 - 7.7 K/uL   Lymphocytes Relative 31 %   Lymphs Abs 2.4 0.7 - 4.0 K/uL   Monocytes Relative 7 %   Monocytes Absolute 0.6 0.1 - 1.0 K/uL   Eosinophils Relative 3 %   Eosinophils Absolute 0.2 0.0 - 0.7 K/uL   Basophils Relative 0 %   Basophils Absolute 0.0 0.0 - 0.1 K/uL  Comprehensive metabolic panel      Status: Abnormal   Collection Time: 02/14/17  8:30 PM  Result Value Ref Range   Sodium 138 135 - 145 mmol/L   Potassium 4.0 3.5 - 5.1 mmol/L   Chloride 107 101 - 111 mmol/L   CO2 20 (L) 22 - 32 mmol/L   Glucose, Bld 96 65 - 99 mg/dL   BUN 23 (H) 6 - 20 mg/dL   Creatinine, Ser 0.84 0.44 - 1.00 mg/dL   Calcium 9.1 8.9 - 10.3 mg/dL   Total Protein 6.7 6.5 - 8.1 g/dL   Albumin 4.0 3.5 - 5.0  g/dL   AST 25 15 - 41 U/L   ALT 20 14 - 54 U/L   Alkaline Phosphatase 58 38 - 126 U/L   Total Bilirubin 0.4 0.3 - 1.2 mg/dL   GFR calc non Af Amer >60 >60 mL/min   GFR calc Af Amer >60 >60 mL/min    Comment: (NOTE) The eGFR has been calculated using the CKD EPI equation. This calculation has not been validated in all clinical situations. eGFR's persistently <60 mL/min signify possible Chronic Kidney Disease.    Anion gap 11 5 - 15  I-stat troponin, ED (not at [facility], [facility])     Status: None   Collection Time: 02/14/17  8:33 PM  Result Value Ref Range   Troponin i, poc 0.02 0.00 - 0.08 ng/mL   Comment 3            Comment: Due to the release kinetics of cTnI, a negative result within the first hours of the onset of symptoms does not rule out myocardial infarction with certainty. If myocardial infarction is still suspected, repeat the test at appropriate intervals.   I-Stat Chem 8, ED  (not at [facility], [facility])     Status: Abnormal   Collection Time: 02/14/17  8:34 PM  Result Value Ref Range   Sodium 139 135 - 145 mmol/L   Potassium 4.0 3.5 - 5.1 mmol/L   Chloride 107 101 - 111 mmol/L   BUN 26 (H) 6 - 20 mg/dL   Creatinine, Ser 1.00 0.44 - 1.00 mg/dL   Glucose, Bld 94 65 - 99 mg/dL   Calcium, Ion 1.07 (L) 1.15 - 1.40 mmol/L   TCO2 20 0 - 100 mmol/L   Hemoglobin 12.6 12.0 - 15.0 g/dL   HCT 37.0 36.0 - 46.0 %  Urine rapid drug screen (hosp performed)not at [facility]     Status: None   Collection Time: 02/14/17  9:24 PM  Result Value Ref Range   Opiates NONE DETECTED NONE DETECTED   Cocaine NONE  DETECTED NONE DETECTED   Benzodiazepines NONE DETECTED NONE DETECTED   Amphetamines NONE DETECTED NONE DETECTED   Tetrahydrocannabinol NONE DETECTED NONE DETECTED   Barbiturates NONE DETECTED NONE DETECTED    Comment:        DRUG SCREEN FOR MEDICAL PURPOSES ONLY.  IF CONFIRMATION IS NEEDED FOR ANY PURPOSE, NOTIFY LAB WITHIN 5 DAYS.        LOWEST DETECTABLE LIMITS FOR URINE DRUG SCREEN Drug Class       Cutoff (ng/mL) Amphetamine      1000 Barbiturate      200 Benzodiazepine   510 Tricyclics       258 Opiates          300 Cocaine          300 THC              50   Urinalysis, Routine w reflex microscopic     Status: Abnormal   Collection Time: 02/14/17  9:24 PM  Result Value Ref Range   Color, Urine COLORLESS (A) YELLOW   APPearance CLEAR CLEAR   Specific Gravity, Urine 1.003 (L) 1.005 - 1.030   pH 6.0 5.0 - 8.0   Glucose, UA NEGATIVE NEGATIVE mg/dL   Hgb urine dipstick NEGATIVE NEGATIVE   Bilirubin Urine NEGATIVE NEGATIVE   Ketones, ur NEGATIVE NEGATIVE mg/dL   Protein, ur NEGATIVE NEGATIVE mg/dL   Nitrite NEGATIVE NEGATIVE   Leukocytes, UA NEGATIVE NEGATIVE   Ct Angio Head W Or Wo  Contrast  Result Date: 02/14/2017 CLINICAL DATA:  Code stroke. 71 y/o F; slurred speech and right-sided weakness. EXAM: CT ANGIOGRAPHY HEAD AND NECK TECHNIQUE: Multidetector CT imaging of the head and neck was performed using the standard protocol during bolus administration of intravenous contrast. Multiplanar CT image reconstructions and MIPs were obtained to evaluate the vascular anatomy. Carotid stenosis measurements (when applicable) are obtained utilizing NASCET criteria, using the distal internal carotid diameter as the denominator. CONTRAST:  50 cc Isovue 370 COMPARISON:  04/26/2012 CT head FINDINGS: CT HEAD FINDINGS Brain: Focal loss of gray-white differentiation in the left insula (series 5, image 30) hypoattenuation within left posterior putamen. Stable background of moderate chronic  microvascular ischemic changes the brain and mild brain parenchymal volume loss. No evidence for intracranial hemorrhage, hydrocephalus, or mass effect. Vascular: Left distal M1 hyperdensity (series 5, image 28). Skull: Normal. Negative for fracture or focal lesion. Sinuses/Orbits: No acute finding. Other: None. ASPECTS [facility] Stroke Program Early CT Score) - Ganglionic level infarction (caudate, lentiform nuclei, internal capsule, insula, M1-M3 cortex): 5 - Supraganglionic infarction (M4-M6 cortex): 3 Total score (0-10 with 10 being normal): 8 CTA NECK FINDINGS Aortic arch: Standard branching. Imaged portion shows no evidence of aneurysm or dissection. No significant stenosis of the major arch vessel origins. Moderate calcific atherosclerosis. Right carotid system: No evidence of dissection, stenosis (50% or greater) or occlusion. Mild calcific atherosclerosis of the bifurcation. 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 Patent left external carotid artery, likely via collateralization. Vertebral arteries: Right dominant. No evidence of dissection, stenosis (50% or greater) or occlusion. Skeleton: C4 through C7 anterior fusion and discectomy. Hardware appears intact without apparent hardware related complication. Multilevel prominent facet arthrosis. No high-grade bony canal stenosis. Other neck: Negative. Upper chest: Moderate centrilobular emphysema and mild biapical pleuroparenchymal scarring. Review of the MIP images confirms the above findings CTA HEAD FINDINGS Anterior circulation: Occlusion of the internal carotid artery to the distal cavernous segment. Paraclinoid and terminal left ICA segments are patent as is the very proximal left M1 likely due to blood flow via a diminutive left posterior communicating artery. Left mid and distal M1 occlusion. Poor left MCA distribution collateralization. Left A1 occlusion. Patent bilateral A2 and  symmetric ACA circulation. The left distal ACA is likely perfused via the anterior communicating artery. Patent right MCA. Posterior circulation: No significant stenosis, proximal occlusion, aneurysm, or vascular malformation. The segments of mild stenosis in the left V4 segment. Venous sinuses: As permitted by contrast timing, patent. Anatomic variants: Bilateral posterior and anterior communicating artery are present. Delayed phase: No abnormal intracranial enhancement. Review of the MIP images confirms the above findings IMPRESSION: CT head: 1. Focal hypodensity in left insula and left posterior putamen compatible with infarction. 2. Hyperdensity and distal left M1. 3. ASPECTS is 8 CTA neck: 1. Occlusion of the left common and internal carotid artery from the arch to the distal cavernous segment. 2. Patent right common carotid system and vertebral arteries without dissection, aneurysm, or significant stenosis. 3. Moderate calcific atherosclerosis of the aortic arch 4. Centrilobular emphysema of lung apices. CTA head: 1. Patent paraclinoid and terminal segments of left internal carotid artery and minimal flow in proximal left M1 likely retrograde via a tiny left posterior communicating artery. 2. Occlusion of mid and distal left M1 with poor left MCA collateralization. 3. Left A1 occlusion. Bilateral A2 are patent with the left likely perfused via the anterior communicating artery. Symmetric distal ACA  circulation. 4. Patent right MCA and posterior circulation. 5. No intracranial aneurysm identified. These results were called by telephone at the time of interpretation on 02/14/2017 at 8:56 pm to Dr. Orlie Dakin ; Yovanna Cogan [facility] , who verbally acknowledged these results. Electronically Signed   By: Kristine Garbe M.D.   On: 02/14/2017 21:08   Ct Angio Neck W Or Wo Contrast  Result Date: 02/14/2017 CLINICAL DATA:  Code stroke. 71 y/o F; slurred speech and right-sided weakness. EXAM: CT ANGIOGRAPHY HEAD  AND NECK TECHNIQUE: Multidetector CT imaging of the head and neck was performed using the standard protocol during bolus administration of intravenous contrast. Multiplanar CT image reconstructions and MIPs were obtained to evaluate the vascular anatomy. Carotid stenosis measurements (when applicable) are obtained utilizing NASCET criteria, using the distal internal carotid diameter as the denominator. CONTRAST:  50 cc Isovue 370 COMPARISON:  04/26/2012 CT head FINDINGS: CT HEAD FINDINGS Brain: Focal loss of gray-white differentiation in the left insula (series 5, image 30) hypoattenuation within left posterior putamen. Stable background of moderate chronic microvascular ischemic changes the brain and mild brain parenchymal volume loss. No evidence for intracranial hemorrhage, hydrocephalus, or mass effect. Vascular: Left distal M1 hyperdensity (series 5, image 28). Skull: Normal. Negative for fracture or focal lesion. Sinuses/Orbits: No acute finding. Other: None. ASPECTS [facility] Stroke Program Early CT Score) - Ganglionic level infarction (caudate, lentiform nuclei, internal capsule, insula, M1-M3 cortex): 5 - Supraganglionic infarction (M4-M6 cortex): 3 Total score (0-10 with 10 being normal): 8 CTA NECK FINDINGS Aortic arch: Standard branching. Imaged portion shows no evidence of aneurysm or dissection. No significant stenosis of the major arch vessel origins. Moderate calcific atherosclerosis. Right carotid system: No evidence of dissection, stenosis (50% or greater) or occlusion. Mild calcific atherosclerosis of the bifurcation. 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 Patent left external carotid artery, likely via collateralization. Vertebral arteries: Right dominant. No evidence of dissection, stenosis (50% or greater) or occlusion. Skeleton: C4 through C7 anterior fusion and discectomy. Hardware appears intact without apparent hardware  related complication. Multilevel prominent facet arthrosis. No high-grade bony canal stenosis. Other neck: Negative. Upper chest: Moderate centrilobular emphysema and mild biapical pleuroparenchymal scarring. Review of the MIP images confirms the above findings CTA HEAD FINDINGS Anterior circulation: Occlusion of the internal carotid artery to the distal cavernous segment. Paraclinoid and terminal left ICA segments are patent as is the very proximal left M1 likely due to blood flow via a diminutive left posterior communicating artery. Left mid and distal M1 occlusion. Poor left MCA distribution collateralization. Left A1 occlusion. Patent bilateral A2 and symmetric ACA circulation. The left distal ACA is likely perfused via the anterior communicating artery. Patent right MCA. Posterior circulation: No significant stenosis, proximal occlusion, aneurysm, or vascular malformation. The segments of mild stenosis in the left V4 segment. Venous sinuses: As permitted by contrast timing, patent. Anatomic variants: Bilateral posterior and anterior communicating artery are present. Delayed phase: No abnormal intracranial enhancement. Review of the MIP images confirms the above findings IMPRESSION: CT head: 1. Focal hypodensity in left insula and left posterior putamen compatible with infarction. 2. Hyperdensity and distal left M1. 3. ASPECTS is 8 CTA neck: 1. Occlusion of the left common and internal carotid artery from the arch to the distal cavernous segment. 2. Patent right common carotid system and vertebral arteries without dissection, aneurysm, or significant stenosis. 3. Moderate calcific atherosclerosis of the aortic arch 4. Centrilobular emphysema of lung  apices. CTA head: 1. Patent paraclinoid and terminal segments of left internal carotid artery and minimal flow in proximal left M1 likely retrograde via a tiny left posterior communicating artery. 2. Occlusion of mid and distal left M1 with poor left MCA  collateralization. 3. Left A1 occlusion. Bilateral A2 are patent with the left likely perfused via the anterior communicating artery. Symmetric distal ACA circulation. 4. Patent right MCA and posterior circulation. 5. No intracranial aneurysm identified. These results were called by telephone at the time of interpretation on 02/14/2017 at 8:56 pm to Dr. Orlie Dakin ; Zani Kyllonen [facility] , who verbally acknowledged these results. Electronically Signed   By: Kristine Garbe M.D.   On: 02/14/2017 21:08   Ct Cerebral Perfusion W Contrast  Result Date: 02/14/2017 CLINICAL DATA:  71 y/o  F; CT perfusion due to stroke. EXAM: CT PERFUSION BRAIN TECHNIQUE: Multiphase CT imaging of the brain was performed following IV bolus contrast injection. Subsequent parametric perfusion maps were calculated using RAPID software. CONTRAST:  50 cc Isovue 370 COMPARISON:  CT angiogram head and neck dated 02/14/2017 FINDINGS: CT Brain Perfusion Findings: CBF (<30%) Volume: 83m Perfusion (Tmax>6.0s) volume: 340mMismatch Volume: 3319mnfarction Location:Left MCA distribution IMPRESSION: 1. Left MCA distribution perfusion anomaly. Ischemic penumbra of 33 cc calculated by mismatch volume. 2. 0 cc infarct core (CBF <30%) by perfusion. This does not included the infarct core volume of the ASPECT 8 infarct on non contrast CT of the head involving the part of the left insula and putamen as visible infarct on noncontrast CT can be pseudo normalized on perfusion. These results were called by telephone at the time of interpretation on 02/14/2017 at 10:01 pm to Dr. ERIKerney Elbewho verbally acknowledged these results. Electronically Signed   By: LanKristine GarbeD.   On: 02/14/2017 22:01   Ct Head Code Stroke W/o Cm  Result Date: 02/14/2017 CLINICAL DATA:  Code stroke. [age] F; slurred speech and right-sided weakness. EXAM: CT ANGIOGRAPHY HEAD AND NECK TECHNIQUE: Multidetector CT imaging of the head and neck was performed using  the standard protocol during bolus administration of intravenous contrast. Multiplanar CT image reconstructions and MIPs were obtained to evaluate the vascular anatomy. Carotid stenosis measurements (when applicable) are obtained utilizing NASCET criteria, using the distal internal carotid diameter as the denominator. CONTRAST:  50 cc Isovue 370 COMPARISON:  04/26/2012 CT head FINDINGS: CT HEAD FINDINGS Brain: Focal loss of gray-white differentiation in the left insula (series 5, image 30) hypoattenuation within left posterior putamen. Stable background of moderate chronic microvascular ischemic changes the brain and mild brain parenchymal volume loss. No evidence for intracranial hemorrhage, hydrocephalus, or mass effect. Vascular: Left distal M1 hyperdensity (series 5, image 28). Skull: Normal. Negative for fracture or focal lesion. Sinuses/Orbits: No acute finding. Other: None. AS[facility]S [facility]) - Ganglionic level infarction (caudate, lentiform nuclei, internal capsule, insula, M1-M3 cortex): 5 - Supraganglionic infarction (M4-M6 cortex): 3 Total score (0-10 with 10 being normal): 8 CTA NECK FINDINGS Aortic arch: Standard branching. Imaged portion shows no evidence of aneurysm or dissection. No significant stenosis of the major arch vessel origins. Moderate calcific atherosclerosis. Right carotid system: No evidence of dissection, stenosis (50% or greater) or occlusion. Mild calcific atherosclerosis of the bifurcation. 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 Patent left external carotid artery, likely via collateralization. Vertebral arteries: Right dominant. No evidence of dissection, stenosis (50% or greater) or occlusion.  Skeleton: C4 through C7 anterior fusion and discectomy. Hardware appears intact without apparent hardware related complication. Multilevel prominent facet arthrosis. No high-grade bony canal  stenosis. Other neck: Negative. Upper chest: Moderate centrilobular emphysema and mild biapical pleuroparenchymal scarring. Review of the MIP images confirms the above findings CTA HEAD FINDINGS Anterior circulation: Occlusion of the internal carotid artery to the distal cavernous segment. Paraclinoid and terminal left ICA segments are patent as is the very proximal left M1 likely due to blood flow via a diminutive left posterior communicating artery. Left mid and distal M1 occlusion. Poor left MCA distribution collateralization. Left A1 occlusion. Patent bilateral A2 and symmetric ACA circulation. The left distal ACA is likely perfused via the anterior communicating artery. Patent right MCA. Posterior circulation: No significant stenosis, proximal occlusion, aneurysm, or vascular malformation. The segments of mild stenosis in the left V4 segment. Venous sinuses: As permitted by contrast timing, patent. Anatomic variants: Bilateral posterior and anterior communicating artery are present. Delayed phase: No abnormal intracranial enhancement. Review of the MIP images confirms the above findings IMPRESSION: CT head: 1. Focal hypodensity in left insula and left posterior putamen compatible with infarction. 2. Hyperdensity and distal left M1. 3. ASPECTS is 8 CTA neck: 1. Occlusion of the left common and internal carotid artery from the arch to the distal cavernous segment. 2. Patent right common carotid system and vertebral arteries without dissection, aneurysm, or significant stenosis. 3. Moderate calcific atherosclerosis of the aortic arch 4. Centrilobular emphysema of lung apices. CTA head: 1. Patent paraclinoid and terminal segments of left internal carotid artery and minimal flow in proximal left M1 likely retrograde via a tiny left posterior communicating artery. 2. Occlusion of mid and distal left M1 with poor left MCA collateralization. 3. Left A1 occlusion. Bilateral A2 are patent with the left likely perfused  via the anterior communicating artery. Symmetric distal ACA circulation. 4. Patent right MCA and posterior circulation. 5. No intracranial aneurysm identified. These results were called by telephone at the time of interpretation on 02/14/2017 at 8:56 pm to Dr. Orlie Dakin ; Phyn[facility] Horton [facility] , who verbally acknowledged these results. Electronically Signed   By: Kristine Garbe M.D.   On: 02/14/2017 21:08    Assessment: 71 y.o. female with acute left MCA stroke secondary to left CCA/ICA and proximal MCA occlusion 1. Not a candidate for tPA as she is out of time window. CT perfusion study shows viable penumbra. ASPECTS is 8. 2. Classifiable as having failed ASA monotherapy 3. History of MS.   Plan: 1. She is an interventional candidate. Discussed with Dr. Estanislado Pandy 2. Will admit to Neurology service after endovascular procedure 3. HgbA1c, fasting lipid panel 4. MRI of the brain without contrast 5. PT consult, OT consult, Speech consult 6. Echocardiogram 7. Possible ASA/Plavix DAPT after procedure 8. Telemetry monitoring 9. Switch pravastatin to atorvastatin 40 mg po qd. Obtain CK level.  10. BP monitoring. Not on an antihypertensive at home.   Electronically signed: Dr. Kerney Elbe 02/14/2017, 10:55 PM

## 2017-02-15 ENCOUNTER — Encounter (HOSPITAL_COMMUNITY): Payer: Self-pay | Admitting: Radiology

## 2017-02-15 ENCOUNTER — Inpatient Hospital Stay (HOSPITAL_COMMUNITY): Payer: Medicare Other

## 2017-02-15 ENCOUNTER — Other Ambulatory Visit (HOSPITAL_COMMUNITY): Payer: Self-pay

## 2017-02-15 DIAGNOSIS — I959 Hypotension, unspecified: Secondary | ICD-10-CM

## 2017-02-15 DIAGNOSIS — E785 Hyperlipidemia, unspecified: Secondary | ICD-10-CM

## 2017-02-15 DIAGNOSIS — G35 Multiple sclerosis: Secondary | ICD-10-CM

## 2017-02-15 HISTORY — PX: IR PERCUTANEOUS ART THROMBECTOMY/INFUSION INTRACRANIAL INC DIAG ANGIO: IMG6087

## 2017-02-15 HISTORY — PX: IR INTRAVSC STENT CERV CAROTID W/O EMB-PROT MOD SED INC ANGIO: IMG2304

## 2017-02-15 HISTORY — PX: IR ANGIO VERTEBRAL SEL SUBCLAVIAN INNOMINATE UNI R MOD SED: IMG5365

## 2017-02-15 HISTORY — PX: IR ANGIO INTRA EXTRACRAN SEL COM CAROTID INNOMINATE UNI R MOD SED: IMG5359

## 2017-02-15 LAB — CBC WITH DIFFERENTIAL/PLATELET
Basophils Absolute: 0 10*3/uL (ref 0.0–0.1)
Basophils Relative: 0 %
EOS PCT: 0 %
Eosinophils Absolute: 0 10*3/uL (ref 0.0–0.7)
HCT: 29.9 % — ABNORMAL LOW (ref 36.0–46.0)
Hemoglobin: 9.8 g/dL — ABNORMAL LOW (ref 12.0–15.0)
LYMPHS ABS: 1.3 10*3/uL (ref 0.7–4.0)
LYMPHS PCT: 12 %
MCH: 31.8 pg (ref 26.0–34.0)
MCHC: 32.8 g/dL (ref 30.0–36.0)
MCV: 97.1 fL (ref 78.0–100.0)
MONO ABS: 0.4 10*3/uL (ref 0.1–1.0)
Monocytes Relative: 4 %
NEUTROS ABS: 8.9 10*3/uL — AB (ref 1.7–7.7)
Neutrophils Relative %: 84 %
PLATELETS: 218 10*3/uL (ref 150–400)
RBC: 3.08 MIL/uL — AB (ref 3.87–5.11)
RDW: 14 % (ref 11.5–15.5)
WBC: 10.6 10*3/uL — ABNORMAL HIGH (ref 4.0–10.5)

## 2017-02-15 LAB — POCT I-STAT 3, ART BLOOD GAS (G3+)
Acid-base deficit: 8 mmol/L — ABNORMAL HIGH (ref 0.0–2.0)
Bicarbonate: 19.3 mmol/L — ABNORMAL LOW (ref 20.0–28.0)
O2 SAT: 99 %
PCO2 ART: 44.1 mmHg (ref 32.0–48.0)
PO2 ART: 192 mmHg — AB (ref 83.0–108.0)
Patient temperature: 36.3
TCO2: 21 mmol/L (ref 0–100)
pH, Arterial: 7.245 — ABNORMAL LOW (ref 7.350–7.450)

## 2017-02-15 LAB — MRSA PCR SCREENING: MRSA by PCR: NEGATIVE

## 2017-02-15 LAB — PLATELET INHIBITION P2Y12: Platelet Function  P2Y12: 336 [PRU] (ref 194–418)

## 2017-02-15 LAB — LIPID PANEL
CHOLESTEROL: 173 mg/dL (ref 0–200)
HDL: 70 mg/dL (ref >40)
LDL Cholesterol: 74 mg/dL (ref 0–99)
TRIGLYCERIDES: 145 mg/dL (ref <150)
Total CHOL/HDL Ratio: 2.5 RATIO
VLDL: 29 mg/dL (ref 0–40)

## 2017-02-15 LAB — TROPONIN I
TROPONIN I: 0.03 ng/mL — AB (ref <0.03)
TROPONIN I: 0.03 ng/mL — AB (ref <0.03)
Troponin I: <0.03 ng/mL (ref <0.03)

## 2017-02-15 LAB — CK: Total CK: 99 U/L (ref 38–234)

## 2017-02-15 MED ORDER — FENTANYL 2500MCG IN NS 250ML (10MCG/ML) PREMIX INFUSION
25.0000 ug/h | INTRAVENOUS | Status: DC
  Administered 2017-02-15: 50 ug/h via INTRAVENOUS
  Filled 2017-02-15: qty 250

## 2017-02-15 MED ORDER — PROPOFOL 500 MG/50ML IV EMUL
INTRAVENOUS | Status: DC | PRN
  Administered 2017-02-15: 25 ug/kg/min via INTRAVENOUS

## 2017-02-15 MED ORDER — MIDAZOLAM HCL 2 MG/2ML IJ SOLN
1.0000 mg | INTRAMUSCULAR | Status: DC | PRN
  Administered 2017-02-17 – 2017-02-20 (×5): 1 mg via INTRAVENOUS
  Filled 2017-02-15 (×4): qty 2

## 2017-02-15 MED ORDER — CLOPIDOGREL BISULFATE 75 MG PO TABS
75.0000 mg | ORAL_TABLET | Freq: Every day | ORAL | Status: DC
  Administered 2017-02-15 – 2017-02-16 (×2): 75 mg via ORAL
  Filled 2017-02-15 (×2): qty 1

## 2017-02-15 MED ORDER — FENTANYL CITRATE (PF) 100 MCG/2ML IJ SOLN: 50.0000 ug | Freq: Once | INTRAMUSCULAR | Status: DC

## 2017-02-15 MED ORDER — EPTIFIBATIDE 20 MG/10ML IV SOLN
INTRAVENOUS | Status: AC
  Filled 2017-02-15: qty 10

## 2017-02-15 MED ORDER — ATORVASTATIN CALCIUM 40 MG PO TABS
40.0000 mg | ORAL_TABLET | Freq: Every day | ORAL | Status: DC
  Administered 2017-02-15 – 2017-02-21 (×6): 40 mg via ORAL
  Filled 2017-02-15 (×6): qty 1

## 2017-02-15 MED ORDER — SODIUM CHLORIDE 0.9 % IV SOLN
INTRAVENOUS | Status: DC
  Administered 2017-02-15 – 2017-02-21 (×4): via INTRAVENOUS

## 2017-02-15 MED ORDER — CHLORHEXIDINE GLUCONATE 0.12% ORAL RINSE (MEDLINE KIT)
15.0000 mL | Freq: Two times a day (BID) | OROMUCOSAL | Status: DC
  Administered 2017-02-15 – 2017-02-22 (×12): 15 mL via OROMUCOSAL

## 2017-02-15 MED ORDER — ORAL CARE MOUTH RINSE
15.0000 mL | Freq: Four times a day (QID) | OROMUCOSAL | Status: DC
  Administered 2017-02-15 – 2017-02-17 (×9): 15 mL via OROMUCOSAL

## 2017-02-15 MED ORDER — ACETAMINOPHEN 650 MG RE SUPP: 650.0000 mg | RECTAL | Status: DC | PRN

## 2017-02-15 MED ORDER — CLEVIDIPINE BUTYRATE 0.5 MG/ML IV EMUL: 0.0000 mg/h | INTRAVENOUS | Status: DC

## 2017-02-15 MED ORDER — ACETAMINOPHEN 160 MG/5ML PO SOLN
650.0000 mg | ORAL | Status: DC | PRN
  Administered 2017-02-15: 650 mg
  Filled 2017-02-15: qty 20.3

## 2017-02-15 MED ORDER — CALCIUM CARBONATE-VITAMIN D 500-200 MG-UNIT PO TABS
1.0000 | ORAL_TABLET | Freq: Every day | ORAL | Status: DC
  Administered 2017-02-15 – 2017-02-22 (×7): 1 via ORAL
  Filled 2017-02-15 (×8): qty 1

## 2017-02-15 MED ORDER — ATROPINE SULFATE 0.4 MG/ML IJ SOLN
INTRAMUSCULAR | Status: DC | PRN
  Administered 2017-02-15: .4 mg via INTRAVENOUS

## 2017-02-15 MED ORDER — ACETAMINOPHEN 325 MG PO TABS: 650.0000 mg | ORAL_TABLET | ORAL | Status: DC | PRN

## 2017-02-15 MED ORDER — PANTOPRAZOLE SODIUM 40 MG IV SOLR
40.0000 mg | Freq: Every day | INTRAVENOUS | Status: DC
  Administered 2017-02-15 – 2017-02-16 (×2): 40 mg via INTRAVENOUS
  Filled 2017-02-15 (×2): qty 40

## 2017-02-15 MED ORDER — MIDAZOLAM HCL 2 MG/2ML IJ SOLN
1.0000 mg | INTRAMUSCULAR | Status: AC | PRN
  Administered 2017-02-17 (×3): 1 mg via INTRAVENOUS
  Filled 2017-02-15 (×5): qty 2

## 2017-02-15 MED ORDER — FENTANYL BOLUS VIA INFUSION
25.0000 ug | INTRAVENOUS | Status: DC | PRN
  Administered 2017-02-15 (×3): 25 ug via INTRAVENOUS
  Filled 2017-02-15: qty 25

## 2017-02-15 MED ORDER — SODIUM CHLORIDE 0.9 % IV SOLN
0.0000 ug/min | INTRAVENOUS | Status: DC
  Administered 2017-02-15: 80 ug/min via INTRAVENOUS
  Administered 2017-02-15: 35 ug/min via INTRAVENOUS
  Administered 2017-02-15: 40 ug/min via INTRAVENOUS
  Administered 2017-02-15: 70 ug/min via INTRAVENOUS
  Administered 2017-02-15: 50 ug/min via INTRAVENOUS
  Administered 2017-02-15: 70 ug/min via INTRAVENOUS
  Administered 2017-02-15: 60 ug/min via INTRAVENOUS
  Administered 2017-02-16: 15 ug/min via INTRAVENOUS
  Filled 2017-02-15 (×2): qty 1
  Filled 2017-02-15: qty 10
  Filled 2017-02-15 (×4): qty 1

## 2017-02-15 MED ORDER — ASPIRIN 325 MG PO TABS
325.0000 mg | ORAL_TABLET | Freq: Every day | ORAL | Status: DC
  Administered 2017-02-15 – 2017-02-16 (×2): 325 mg via ORAL
  Filled 2017-02-15 (×2): qty 1

## 2017-02-15 MED ORDER — ONDANSETRON HCL 4 MG/2ML IJ SOLN: 4.0000 mg | Freq: Four times a day (QID) | INTRAMUSCULAR | Status: DC | PRN

## 2017-02-15 MED ORDER — LABETALOL HCL 5 MG/ML IV SOLN
INTRAVENOUS | Status: DC | PRN
  Administered 2017-02-15 (×2): 10 mg via INTRAVENOUS

## 2017-02-15 NOTE — Consult Note (Signed)
**Note Kaitlin via Obfuscation** PULMONARY / CRITICAL CARE MEDICINE   Name: Kaitlin [name] MRN: [id number] DOB: 1945/09/26    ADMISSION DATE:  02/14/2017 CONSULTATION DATE:  02/15/17  REFERRING MD:  Otelia Limes  CHIEF COMPLAINT:  AMS  HISTORY OF PRESENT ILLNESS:  Pt is encephelopathic; therefore, this HPI is obtained from chart review. Kaitlin [name] is a 71 y.o. F with PMH as below.  She presented to [facility] ED 7/17 with right sided weakness and aphasia.  She was working in the yard with husband around 11 am the morning prior and got very hot and was not talking normally.  She later took a shower and ate lunch and acted normal for the most part.  Later she was unable to get up and had right sided weakness.  She was brought to [facility] where CT demonstrated left MCA occlusion.  She was intubated and was taken to IR where she had b/l common carotid arteriograms and right verterbral arteriogram with complete revascularization of occluded left MCA and stent assisted angioplasty of symptomatic acute occlusion of prox left ICA.  She returned to the ICU on the vent and PCCM was asked to assist with vent management.  PAST MEDICAL HISTORY :  She  has a past medical history of High cholesterol and Multiple sclerosis (HCC).  PAST SURGICAL HISTORY: She  has a past surgical history that includes Coronary angioplasty with stent; Wrist surgery; and Neck surgery.  Allergies  Allergen Reactions  . Penicillins     No current facility-administered medications on file prior to encounter.    Current Outpatient Prescriptions on File Prior to Encounter  Medication Sig  . FLUoxetine (PROZAC) 10 MG capsule Take 10 mg by mouth daily.  . pravastatin (PRAVACHOL) 10 MG tablet Take 10 mg by mouth daily.   Marland Kitchen venlafaxine XR (EFFEXOR-XR) 75 MG 24 hr capsule Take 75 mg by mouth daily.  . traMADol (ULTRAM) 50 MG tablet Take 1 tablet (50 mg total) by mouth every 6 (six) hours as needed for pain. (Patient not taking: Reported on 02/14/2017)    FAMILY HISTORY:   Her has no family status information on file.    SOCIAL HISTORY: She  reports that she has never smoked. She has never used smokeless tobacco. She reports that she drinks about 1.8 oz of alcohol per week . She reports that she does not use drugs.  REVIEW OF SYSTEMS:   Unable to obtain as pt is encephalopathic.  SUBJECTIVE:  On vent, unresponsive.  VITAL SIGNS: BP 137/66   Pulse 70   Temp 98.1 F (36.7 C) (Oral)   Resp 20   Ht 5\' 2"  (1.575 m)   Wt 56.2 kg (124 lb)   SpO2 94%   BMI 22.68 kg/m   HEMODYNAMICS:    VENTILATOR SETTINGS:    INTAKE / OUTPUT: No intake/output data recorded.  PHYSICAL EXAMINATION: General:  Laying in bed on vent, in NAD Neuro:  Sedated, withdraws to painful stimuli HEENT:  [place], AT. ETT in place. Cardiovascular:  RRR, no murmurs Lungs:  CTAB, normal effort on vent Abdomen:  Soft, nontender, nondistended, + bowel sounds Musculoskeletal:  Normal bulk and tone, moving all limbs. No LE edema Skin:  Warm and dry.   LABS:  BMET  Recent Labs Lab 02/14/17 2030 02/14/17 2034  NA 138 139  K 4.0 4.0  CL 107 107  CO2 20*  --   BUN 23* 26*  CREATININE 0.84 1.00  GLUCOSE 96 94    Electrolytes  Recent Labs Lab 02/14/17 2030  CALCIUM 9.1    CBC  Recent Labs Lab 02/14/17 2030 02/14/17 2034  WBC 7.6  --   HGB 12.2 12.6  HCT 37.3 37.0  PLT 203  --     Coag's  Recent Labs Lab 02/14/17 2030  APTT 25  INR 0.94    Sepsis Markers No results for input(s): LATICACIDVEN, PROCALCITON, O2SATVEN in the last 168 hours.  ABG No results for input(s): PHART, PCO2ART, PO2ART in the last 168 hours.  Liver Enzymes  Recent Labs Lab 02/14/17 2030  AST 25  ALT 20  ALKPHOS 58  BILITOT 0.4  ALBUMIN 4.0    Cardiac Enzymes No results for input(s): TROPONINI, PROBNP in the last 168 hours.  Glucose No results for input(s): GLUCAP in the last 168 hours.  Imaging Ct Angio Head W Or Wo Contrast  Result Date: 02/14/2017 CLINICAL  DATA:  Code stroke. 71 y/o F; slurred speech and right-sided weakness. EXAM: CT ANGIOGRAPHY HEAD AND NECK TECHNIQUE: Multidetector CT imaging of the head and neck was performed using the standard protocol during bolus administration of intravenous contrast. Multiplanar CT image reconstructions and MIPs were obtained to evaluate the vascular anatomy. Carotid stenosis measurements (when applicable) are obtained utilizing NASCET criteria, using the distal internal carotid diameter as the denominator. CONTRAST:  50 cc Isovue 370 COMPARISON:  04/26/2012 CT head FINDINGS: CT HEAD FINDINGS Brain: Focal loss of gray-white differentiation in the left insula (series 5, image 30) hypoattenuation within left posterior putamen. Stable background of moderate chronic microvascular ischemic changes the brain and mild brain parenchymal volume loss. No evidence for intracranial hemorrhage, hydrocephalus, or mass effect. Vascular: Left distal M1 hyperdensity (series 5, image 28). Skull: Normal. Negative for fracture or focal lesion. Sinuses/Orbits: No acute finding. Other: None. ASPECTS [facility] Stroke Program Early CT Score) - Ganglionic level infarction (caudate, lentiform nuclei, internal capsule, insula, M1-M3 cortex): 5 - Supraganglionic infarction (M4-M6 cortex): 3 Total score (0-10 with 10 being normal): 8 CTA NECK FINDINGS Aortic arch: Standard branching. Imaged portion shows no evidence of aneurysm or dissection. No significant stenosis of the major arch vessel origins. Moderate calcific atherosclerosis. Right carotid system: No evidence of dissection, stenosis (50% or greater) or occlusion. Mild calcific atherosclerosis of the bifurcation. 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 Patent left external carotid artery, likely via collateralization. Vertebral arteries: Right dominant. No evidence of dissection, stenosis (50% or greater) or occlusion.  Skeleton: C4 through C7 anterior fusion and discectomy. Hardware appears intact without apparent hardware related complication. Multilevel prominent facet arthrosis. No high-grade bony canal stenosis. Other neck: Negative. Upper chest: Moderate centrilobular emphysema and mild biapical pleuroparenchymal scarring. Review of the MIP images confirms the above findings CTA HEAD FINDINGS Anterior circulation: Occlusion of the internal carotid artery to the distal cavernous segment. Paraclinoid and terminal left ICA segments are patent as is the very proximal left M1 likely due to blood flow via a diminutive left posterior communicating artery. Left mid and distal M1 occlusion. Poor left MCA distribution collateralization. Left A1 occlusion. Patent bilateral A2 and symmetric ACA circulation. The left distal ACA is likely perfused via the anterior communicating artery. Patent right MCA. Posterior circulation: No significant stenosis, proximal occlusion, aneurysm, or vascular malformation. The segments of mild stenosis in the left V4 segment. Venous sinuses: As permitted by contrast timing, patent. Anatomic variants: Bilateral posterior and anterior communicating artery are present. Delayed phase: No abnormal intracranial enhancement. Review of the MIP images confirms  the above findings IMPRESSION: CT head: 1. Focal hypodensity in left insula and left posterior putamen compatible with infarction. 2. Hyperdensity and distal left M1. 3. ASPECTS is 8 CTA neck: 1. Occlusion of the left common and internal carotid artery from the arch to the distal cavernous segment. 2. Patent right common carotid system and vertebral arteries without dissection, aneurysm, or significant stenosis. 3. Moderate calcific atherosclerosis of the aortic arch 4. Centrilobular emphysema of lung apices. CTA head: 1. Patent paraclinoid and terminal segments of left internal carotid artery and minimal flow in proximal left M1 likely retrograde via a tiny  left posterior communicating artery. 2. Occlusion of mid and distal left M1 with poor left MCA collateralization. 3. Left A1 occlusion. Bilateral A2 are patent with the left likely perfused via the anterior communicating artery. Symmetric distal ACA circulation. 4. Patent right MCA and posterior circulation. 5. No intracranial aneurysm identified. These results were called by telephone at the time of interpretation on 02/14/2017 at 8:56 pm to Dr. Doug Sou ; [facility] , who verbally acknowledged these results. Electronically Signed   By: Mitzi Hansen M.D.   On: 02/14/2017 21:08   Ct Angio Neck W Or Wo Contrast  Result Date: 02/14/2017 CLINICAL DATA:  Code stroke. 71 y/o F; slurred speech and right-sided weakness. EXAM: CT ANGIOGRAPHY HEAD AND NECK TECHNIQUE: Multidetector CT imaging of the head and neck was performed using the standard protocol during bolus administration of intravenous contrast. Multiplanar CT image reconstructions and MIPs were obtained to evaluate the vascular anatomy. Carotid stenosis measurements (when applicable) are obtained utilizing NASCET criteria, using the distal internal carotid diameter as the denominator. CONTRAST:  50 cc Isovue 370 COMPARISON:  04/26/2012 CT head FINDINGS: CT HEAD FINDINGS Brain: Focal loss of gray-white differentiation in the left insula (series 5, image 30) hypoattenuation within left posterior putamen. Stable background of moderate chronic microvascular ischemic changes the brain and mild brain parenchymal volume loss. No evidence for intracranial hemorrhage, hydrocephalus, or mass effect. Vascular: Left distal M1 hyperdensity (series 5, image 28). Skull: Normal. Negative for fracture or focal lesion. Sinuses/Orbits: No acute finding. Other: None. ASPECTS [facility] Stroke Program Early CT Score) - Ganglionic level infarction (caudate, lentiform nuclei, internal capsule, insula, M1-M3 cortex): 5 - Supraganglionic infarction (M4-M6 cortex): 3  Total score (0-10 with 10 being normal): 8 CTA NECK FINDINGS Aortic arch: Standard branching. Imaged portion shows no evidence of aneurysm or dissection. No significant stenosis of the major arch vessel origins. Moderate calcific atherosclerosis. Right carotid system: No evidence of dissection, stenosis (50% or greater) or occlusion. Mild calcific atherosclerosis of the bifurcation. 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 Patent left external carotid artery, likely via collateralization. Vertebral arteries: Right dominant. No evidence of dissection, stenosis (50% or greater) or occlusion. Skeleton: C4 through C7 anterior fusion and discectomy. Hardware appears intact without apparent hardware related complication. Multilevel prominent facet arthrosis. No high-grade bony canal stenosis. Other neck: Negative. Upper chest: Moderate centrilobular emphysema and mild biapical pleuroparenchymal scarring. Review of the MIP images confirms the above findings CTA HEAD FINDINGS Anterior circulation: Occlusion of the internal carotid artery to the distal cavernous segment. Paraclinoid and terminal left ICA segments are patent as is the very proximal left M1 likely due to blood flow via a diminutive left posterior communicating artery. Left mid and distal M1 occlusion. Poor left MCA distribution collateralization. Left A1 occlusion. Patent bilateral A2 and symmetric ACA circulation. The left  distal ACA is likely perfused via the anterior communicating artery. Patent right MCA. Posterior circulation: No significant stenosis, proximal occlusion, aneurysm, or vascular malformation. The segments of mild stenosis in the left V4 segment. Venous sinuses: As permitted by contrast timing, patent. Anatomic variants: Bilateral posterior and anterior communicating artery are present. Delayed phase: No abnormal intracranial enhancement. Review of the MIP images confirms  the above findings IMPRESSION: CT head: 1. Focal hypodensity in left insula and left posterior putamen compatible with infarction. 2. Hyperdensity and distal left M1. 3. ASPECTS is 8 CTA neck: 1. Occlusion of the left common and internal carotid artery from the arch to the distal cavernous segment. 2. Patent right common carotid system and vertebral arteries without dissection, aneurysm, or significant stenosis. 3. Moderate calcific atherosclerosis of the aortic arch 4. Centrilobular emphysema of lung apices. CTA head: 1. Patent paraclinoid and terminal segments of left internal carotid artery and minimal flow in proximal left M1 likely retrograde via a tiny left posterior communicating artery. 2. Occlusion of mid and distal left M1 with poor left MCA collateralization. 3. Left A1 occlusion. Bilateral A2 are patent with the left likely perfused via the anterior communicating artery. Symmetric distal ACA circulation. 4. Patent right MCA and posterior circulation. 5. No intracranial aneurysm identified. These results were called by telephone at the time of interpretation on 02/14/2017 at 8:56 pm to Dr. Doug Sou ; [facility] , who verbally acknowledged these results. Electronically Signed   By: Mitzi Hansen M.D.   On: 02/14/2017 21:08   Ct Cerebral Perfusion W Contrast  Result Date: 02/14/2017 CLINICAL DATA:  71 y/o  F; CT perfusion due to stroke. EXAM: CT PERFUSION BRAIN TECHNIQUE: Multiphase CT imaging of the brain was performed following IV bolus contrast injection. Subsequent parametric perfusion maps were calculated using RAPID software. CONTRAST:  50 cc Isovue 370 COMPARISON:  CT angiogram head and neck dated 02/14/2017 FINDINGS: CT Brain Perfusion Findings: CBF (<30%) Volume: 0mL Perfusion (Tmax>6.0s) volume: 33mL Mismatch Volume: 33mL Infarction Location:Left MCA distribution IMPRESSION: 1. Left MCA distribution perfusion anomaly. Ischemic penumbra of 33 cc calculated by mismatch volume.  2. 0 cc infarct core (CBF <30%) by perfusion. This does not included the infarct core volume of the ASPECT 8 infarct on non contrast CT of the head involving the part of the left insula and putamen as visible infarct on noncontrast CT can be pseudo normalized on perfusion. These results were called by telephone at the time of interpretation on 02/14/2017 at 10:01 pm to Dr. Caryl Pina , who verbally acknowledged these results. Electronically Signed   By: Mitzi Hansen M.D.   On: 02/14/2017 22:01   Ct Head Code Stroke W/o Cm  Result Date: 02/14/2017 CLINICAL DATA:  Code stroke. 71 y/o F; slurred speech and right-sided weakness. EXAM: CT ANGIOGRAPHY HEAD AND NECK TECHNIQUE: Multidetector CT imaging of the head and neck was performed using the standard protocol during bolus administration of intravenous contrast. Multiplanar CT image reconstructions and MIPs were obtained to evaluate the vascular anatomy. Carotid stenosis measurements (when applicable) are obtained utilizing NASCET criteria, using the distal internal carotid diameter as the denominator. CONTRAST:  50 cc Isovue 370 COMPARISON:  04/26/2012 CT head FINDINGS: CT HEAD FINDINGS Brain: Focal loss of gray-white differentiation in the left insula (series 5, image 30) hypoattenuation within left posterior putamen. Stable background of moderate chronic microvascular ischemic changes the brain and mild brain parenchymal volume loss. No evidence for intracranial hemorrhage, hydrocephalus, or mass effect. Vascular: Left distal  M1 hyperdensity (series 5, image 28). Skull: Normal. Negative for fracture or focal lesion. Sinuses/Orbits: No acute finding. Other: None. ASPECTS [facility] Stroke Program Early CT Score) - Ganglionic level infarction (caudate, lentiform nuclei, internal capsule, insula, M1-M3 cortex): 5 - Supraganglionic infarction (M4-M6 cortex): 3 Total score (0-10 with 10 being normal): 8 CTA NECK FINDINGS Aortic arch: Standard branching.  Imaged portion shows no evidence of aneurysm or dissection. No significant stenosis of the major arch vessel origins. Moderate calcific atherosclerosis. Right carotid system: No evidence of dissection, stenosis (50% or greater) or occlusion. Mild calcific atherosclerosis of the bifurcation. 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 Patent left external carotid artery, likely via collateralization. Vertebral arteries: Right dominant. No evidence of dissection, stenosis (50% or greater) or occlusion. Skeleton: C4 through C7 anterior fusion and discectomy. Hardware appears intact without apparent hardware related complication. Multilevel prominent facet arthrosis. No high-grade bony canal stenosis. Other neck: Negative. Upper chest: Moderate centrilobular emphysema and mild biapical pleuroparenchymal scarring. Review of the MIP images confirms the above findings CTA HEAD FINDINGS Anterior circulation: Occlusion of the internal carotid artery to the distal cavernous segment. Paraclinoid and terminal left ICA segments are patent as is the very proximal left M1 likely due to blood flow via a diminutive left posterior communicating artery. Left mid and distal M1 occlusion. Poor left MCA distribution collateralization. Left A1 occlusion. Patent bilateral A2 and symmetric ACA circulation. The left distal ACA is likely perfused via the anterior communicating artery. Patent right MCA. Posterior circulation: No significant stenosis, proximal occlusion, aneurysm, or vascular malformation. The segments of mild stenosis in the left V4 segment. Venous sinuses: As permitted by contrast timing, patent. Anatomic variants: Bilateral posterior and anterior communicating artery are present. Delayed phase: No abnormal intracranial enhancement. Review of the MIP images confirms the above findings IMPRESSION: CT head: 1. Focal hypodensity in left insula and left posterior  putamen compatible with infarction. 2. Hyperdensity and distal left M1. 3. ASPECTS is 8 CTA neck: 1. Occlusion of the left common and internal carotid artery from the arch to the distal cavernous segment. 2. Patent right common carotid system and vertebral arteries without dissection, aneurysm, or significant stenosis. 3. Moderate calcific atherosclerosis of the aortic arch 4. Centrilobular emphysema of lung apices. CTA head: 1. Patent paraclinoid and terminal segments of left internal carotid artery and minimal flow in proximal left M1 likely retrograde via a tiny left posterior communicating artery. 2. Occlusion of mid and distal left M1 with poor left MCA collateralization. 3. Left A1 occlusion. Bilateral A2 are patent with the left likely perfused via the anterior communicating artery. Symmetric distal ACA circulation. 4. Patent right MCA and posterior circulation. 5. No intracranial aneurysm identified. These results were called by telephone at the time of interpretation on 02/14/2017 at 8:56 pm to Dr. Doug Sou ; [facility] , who verbally acknowledged these results. Electronically Signed   By: Mitzi Hansen M.D.   On: 02/14/2017 21:08     STUDIES:  CTA head 7/17 > focal hypodensity in left insula and left posterior putamen.  Left MCA perfusion anomaly.  CT head 7/17 > contrast staining vs blood products left basal ganglia to left temporal lobe. CT head 7/18 >   CULTURES: None.  ANTIBIOTICS: None.  SIGNIFICANT EVENTS: 7/17 > admit.  Taken to IR for b/l common carotid arteriograms and right verterbral arteriogram with complete revascularization of occluded left MCA and stent assisted angioplasty of  symptomatic acute occlusion of prox left ICA.  LINES/TUBES: ETT 7/17 >   DISCUSSION: 71 y.o. F admitted with left MCA stroke, taken to IR for revascularization then returned to ICU on vent.   ASSESSMENT / PLAN:  PULMONARY A: Acute respiratory failure  P:   Mechanical  ventilation SBT in am Follow CXR  CARDIOVASCULAR A:  S/p arteriogram with L ICA stent placement P:  Telemetry Monitor CBC post procedure  RENAL A:   No acute issues P:   Monitor on BMP   GASTROINTESTINAL A:   GI ppx P:   PPI  HEMATOLOGIC A:   DVT ppx P:  lovenox  INFECTIOUS A:   No acute issues   ENDOCRINE A:   No acute issues     NEUROLOGIC A:   Acute L MCA stroke S/p b/l CCA and R vertebral arteriogram with L ICA stent P:   RASS goal: 0, -1 Fentanyl gtt PRN versed Neurology stroke team following Continue ASA, plavix, atorvastatin  FAMILY  - Updates: family updated at bedside  - Inter-disciplinary family meet or Palliative Care meeting due by:  02/21/17  Leland Her, DO PGY-2, [facility] 02/15/2017 3:04 AM   Attending Addendum: I personally examined this patient and agree with plan as detailed above. Acute L-MCA CVA s/p arteriogram with revascularization and stent. Keep SBP 120-140 per NeuroSurg. Continue phenylephrine as needed. ABG shows mixed respiratory and metabolic acidosis. Increased RR on vent from 16 to 20. Looks pre-renal on labs likely the cause for the metabolic acidosis, should improve with IVF's. Check lactate. CXR on my review shows ETT a little high; will advance 1cm. On my exam, lungs CTA b/l. Heart RRR no m/r/g. 2-PIV's, Aline, Sheath. Patient awakens to voice but falls easily back to sleep. On fentanyl gtt for sedation.   40 min critical care time  Milana Obey, MD Pulmonary & Critical Care Medicine

## 2017-02-15 NOTE — Transfer of Care (Signed)
Immediate Anesthesia Transfer of Care Note  Patient: Kaitlin [name]  Procedure(s) Performed: Procedure(s): RADIOLOGY WITH ANESTHESIA (N/A)  Patient Location: NICU  Anesthesia Type:General  Level of Consciousness: Patient remains intubated per anesthesia plan  Airway & Oxygen Therapy: Patient placed on Ventilator (see vital sign flow sheet for setting)  Post-op Assessment: Report given to RN and Post -op Vital signs reviewed and stable  Post vital signs: Reviewed and stable  Last Vitals:  Vitals:   02/14/17 2200 02/15/17 0227  BP: 115/69 137/66  Pulse: 70   Resp: 20   Temp:      Last Pain:  Vitals:   02/14/17 2230  TempSrc:   PainSc: 0-No pain         Complications: No apparent anesthesia complications

## 2017-02-15 NOTE — Progress Notes (Signed)
SLP Cancellation Note  Patient Details Name: Kaitlin [name] MRN: [id number] DOB: 09/08/1945   Cancelled treatment:       Reason Eval/Treat Not Completed: Patient not medically ready, remains intubated this morning. Will f/u as able.   Maxcine Ham 02/15/2017, 8:38 AM  Maxcine Ham, M.A. CCC-SLP [phone]

## 2017-02-15 NOTE — Progress Notes (Signed)
Pt urine output 225 for 8.5 hours. elink notified. rn told to reassess in 24hrs. Will continue to monitor

## 2017-02-15 NOTE — Progress Notes (Signed)
PCCM INTERVAL PROGRESS NOTE  Called to bedside by staff RN to evaluate bradycardia.   Currently sinus brady on tele. Not on precedex or propofol. No beta blockers on board. HR was 70s on admit.   EKG done. - sinus brady with TWI in anterolateral leads. TWI is consistent with EKG from yesterday, but is new from EKG in 2013.  Plan: Cycle troponin.   Joneen Roach, AGACNP-BC [facility] Pulmonology/Critical Care Pager [phone] or [phone]  02/15/2017 8:30 AM

## 2017-02-15 NOTE — Progress Notes (Addendum)
Removed right 27fr sheath at 16:44.  Manual pressure held and V-Pad applied. Hemostasis achieved at 17:30 .  Groin site reviewed with RN Ashok Cordia).  No complications.

## 2017-02-15 NOTE — Progress Notes (Signed)
Pt. Was transported to MRI & back to 4N21 without any complications.

## 2017-02-15 NOTE — Progress Notes (Signed)
E Link MD notified of troponin 0.03.

## 2017-02-15 NOTE — Progress Notes (Signed)
Referring Physician(s): Dr Jerel Shepherd  Supervising Physician: Julieanne Cotton  Patient Status:  [facility] - In-pt  Chief Complaint:  CVA  Subjective:  bilateral common carotid arteriograms and Rt Vertebral arteriogram followed by complete revascularization of occluded Lt MCA  With x 1 pass with 36mm x 40 mm Solitaire FR retrieval device with TICI 3 restoration. S/P  Stent assisted angioplasty of  symptomatic acute  occlusion of prox Lt ICA And 8.1 mg of IA Integrelin  Intubated Opens eyes to [place] Moving left arm and B toes   Allergies: Penicillins  Medications: Prior to Admission medications   Medication Sig Start Date End Date Taking? Authorizing Provider  aspirin EC 81 MG tablet Take 81 mg by mouth daily.   Yes [provider]  Calcium Citrate-Vitamin D (CALCIUM CITRATE + D PO) Take 1 tablet by mouth daily.   Yes [provider]  FLUoxetine (PROZAC) 10 MG capsule Take 10 mg by mouth daily.   Yes [provider]  gabapentin (NEURONTIN) 300 MG capsule Take 300 mg by mouth as needed. 11/19/16  Yes [provider]  Multiple Vitamins-Minerals (PRESERVISION AREDS PO) Take 1 tablet by mouth 2 (two) times daily.   Yes [provider]  omega-3 acid ethyl esters (LOVAZA) 1 g capsule Take 1 g by mouth 2 (two) times daily. 1200 mg   Yes [provider]  pravastatin (PRAVACHOL) 10 MG tablet Take 10 mg by mouth daily.    Yes [provider]  venlafaxine XR (EFFEXOR-XR) 75 MG 24 hr capsule Take 75 mg by mouth daily.   Yes [provider]  traMADol (ULTRAM) 50 MG tablet Take 1 tablet (50 mg total) by mouth every 6 (six) hours as needed for pain. Patient not taking: Reported on 02/14/2017 04/26/12   Ivonne Andrew, PA-C     Vital Signs: BP (!) 96/36   Pulse (!) 47   Temp 99.1 F (37.3 C)   Resp 20   Ht 5\' 2"  (1.575 m)   Wt 124 lb (56.2 kg)   SpO2 99%   BMI 22.68 kg/m   Physical Exam  Constitutional:  intubated    Cardiovascular: Normal rate.   Pulmonary/Chest:  vent  Abdominal: Soft.  Neurological:  Seems to try to focus on [place] in room  Skin: Skin is warm.  Groin - +sheath intact right foot + pulses  Nursing note and vitals reviewed.   Imaging: Ct Angio Head W Or Wo Contrast  Result Date: 02/14/2017 CLINICAL DATA:  Code stroke. 71 y/o F; slurred speech and right-sided weakness. EXAM: CT ANGIOGRAPHY HEAD AND NECK TECHNIQUE: Multidetector CT imaging of the head and neck was performed using the standard protocol during bolus administration of intravenous contrast. Multiplanar CT image reconstructions and MIPs were obtained to evaluate the vascular anatomy. Carotid stenosis measurements (when applicable) are obtained utilizing NASCET criteria, using the distal internal carotid diameter as the denominator. CONTRAST:  50 cc Isovue 370 COMPARISON:  04/26/2012 CT head FINDINGS: CT HEAD FINDINGS Brain: Focal loss of gray-white differentiation in the left insula (series 5, image 30) hypoattenuation within left posterior putamen. Stable background of moderate chronic microvascular ischemic changes the brain and mild brain parenchymal volume loss. No evidence for intracranial hemorrhage, hydrocephalus, or mass effect. Vascular: Left distal M1 hyperdensity (series 5, image 28). Skull: Normal. Negative for fracture or focal lesion. Sinuses/Orbits: No acute finding. Other: None. ASPECTS [facility] Stroke Program Early CT Score) - Ganglionic level infarction (caudate, lentiform nuclei, internal capsule, insula, M1-M3  cortex): 5 - Supraganglionic infarction (M4-M6 cortex): 3 Total score (0-10 with 10 being normal): 8 CTA NECK FINDINGS Aortic arch: Standard branching. Imaged portion shows no evidence of aneurysm or dissection. No significant stenosis of the major arch vessel origins. Moderate calcific atherosclerosis. Right carotid system: No evidence of dissection, stenosis (50% or greater) or occlusion. Mild calcific  atherosclerosis of the bifurcation. 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 Patent left external carotid artery, likely via collateralization. Vertebral arteries: Right dominant. No evidence of dissection, stenosis (50% or greater) or occlusion. Skeleton: C4 through C7 anterior fusion and discectomy. Hardware appears intact without apparent hardware related complication. Multilevel prominent facet arthrosis. No high-grade bony canal stenosis. Other neck: Negative. Upper chest: Moderate centrilobular emphysema and mild biapical pleuroparenchymal scarring. Review of the MIP images confirms the above findings CTA HEAD FINDINGS Anterior circulation: Occlusion of the internal carotid artery to the distal cavernous segment. Paraclinoid and terminal left ICA segments are patent as is the very proximal left M1 likely due to blood flow via a diminutive left posterior communicating artery. Left mid and distal M1 occlusion. Poor left MCA distribution collateralization. Left A1 occlusion. Patent bilateral A2 and symmetric ACA circulation. The left distal ACA is likely perfused via the anterior communicating artery. Patent right MCA. Posterior circulation: No significant stenosis, proximal occlusion, aneurysm, or vascular malformation. The segments of mild stenosis in the left V4 segment. Venous sinuses: As permitted by contrast timing, patent. Anatomic variants: Bilateral posterior and anterior communicating artery are present. Delayed phase: No abnormal intracranial enhancement. Review of the MIP images confirms the above findings IMPRESSION: CT head: 1. Focal hypodensity in left insula and left posterior putamen compatible with infarction. 2. Hyperdensity and distal left M1. 3. ASPECTS is 8 CTA neck: 1. Occlusion of the left common and internal carotid artery from the arch to the distal cavernous segment. 2. Patent right common carotid system and  vertebral arteries without dissection, aneurysm, or significant stenosis. 3. Moderate calcific atherosclerosis of the aortic arch 4. Centrilobular emphysema of lung apices. CTA head: 1. Patent paraclinoid and terminal segments of left internal carotid artery and minimal flow in proximal left M1 likely retrograde via a tiny left posterior communicating artery. 2. Occlusion of mid and distal left M1 with poor left MCA collateralization. 3. Left A1 occlusion. Bilateral A2 are patent with the left likely perfused via the anterior communicating artery. Symmetric distal ACA circulation. 4. Patent right MCA and posterior circulation. 5. No intracranial aneurysm identified. These results were called by telephone at the time of interpretation on 02/14/2017 at 8:56 pm to Dr. Doug Sou ; [facility] , who verbally acknowledged these results. Electronically Signed   By: Mitzi Hansen M.D.   On: 02/14/2017 21:08   Ct Head Wo Contrast  Addendum Date: 02/15/2017   ADDENDUM REPORT: 02/15/2017 03:18 ADDENDUM: Acute findings discussed with and reconfirmed by Dr.Lindzen, Neurology on 02/15/2017 at 3:15 am. Electronically Signed   By: Awilda Metro M.D.   On: 02/15/2017 03:18   Result Date: 02/15/2017 CLINICAL DATA:  Follow-up code stroke. Status post revascularization of occluded LEFT middle cerebral artery and angioplasty of occluded proximal LEFT internal carotid artery. EXAM: CT HEAD WITHOUT CONTRAST TECHNIQUE: Contiguous axial images were obtained from the base of the skull through the vertex without intravenous contrast. COMPARISON:  CT HEAD February 14, 2017 FINDINGS: BRAIN: Abnormal contiguous density LEFT basal ganglia to anteromedial temporal lobe. No mass effect or midline  shift. Ventricles and sulci are overall normal for patient's age. Confluent supratentorial white matter hypodensities. Relative preservation of gray-white matter differentiation in areas not obscured by new density. No abnormal  extra-axial fluid collections. Basal cisterns are patent. VASCULAR: Moderate calcific atherosclerosis of the carotid siphons. SKULL: No skull fracture. Severe temporomandibular osteoarthrosis. No significant scalp soft tissue swelling. SINUSES/ORBITS: The mastoid air-cells and included paranasal sinuses are well-aerated.The included ocular globes and orbital contents are non-suspicious. Status post bilateral ocular lens implants. OTHER: Life-support lines in place. IMPRESSION: 1. Contrast staining versus blood products LEFT basal ganglia to LEFT temporal lobe. Recommend 6-12 hour follow-up CT. 2. Moderate to severe chronic small vessel ischemic disease. These results will be called to the ordering clinician or representative by the Radiologist Assistant, and communication documented in the [employer] Dashboard. Electronically Signed: By: Awilda Metro M.D. On: 02/15/2017 03:01   Ct Angio Neck W Or Wo Contrast  Result Date: 02/14/2017 CLINICAL DATA:  Code stroke. 71 y/o F; slurred speech and right-sided weakness. EXAM: CT ANGIOGRAPHY HEAD AND NECK TECHNIQUE: Multidetector CT imaging of the head and neck was performed using the standard protocol during bolus administration of intravenous contrast. Multiplanar CT image reconstructions and MIPs were obtained to evaluate the vascular anatomy. Carotid stenosis measurements (when applicable) are obtained utilizing NASCET criteria, using the distal internal carotid diameter as the denominator. CONTRAST:  50 cc Isovue 370 COMPARISON:  04/26/2012 CT head FINDINGS: CT HEAD FINDINGS Brain: Focal loss of gray-white differentiation in the left insula (series 5, image 30) hypoattenuation within left posterior putamen. Stable background of moderate chronic microvascular ischemic changes the brain and mild brain parenchymal volume loss. No evidence for intracranial hemorrhage, hydrocephalus, or mass effect. Vascular: Left distal M1 hyperdensity (series 5, image 28). Skull:  Normal. Negative for fracture or focal lesion. Sinuses/Orbits: No acute finding. Other: None. ASPECTS [facility] Stroke Program Early CT Score) - Ganglionic level infarction (caudate, lentiform nuclei, internal capsule, insula, M1-M3 cortex): 5 - Supraganglionic infarction (M4-M6 cortex): 3 Total score (0-10 with 10 being normal): 8 CTA NECK FINDINGS Aortic arch: Standard branching. Imaged portion shows no evidence of aneurysm or dissection. No significant stenosis of the major arch vessel origins. Moderate calcific atherosclerosis. Right carotid system: No evidence of dissection, stenosis (50% or greater) or occlusion. Mild calcific atherosclerosis of the bifurcation. 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 Patent left external carotid artery, likely via collateralization. Vertebral arteries: Right dominant. No evidence of dissection, stenosis (50% or greater) or occlusion. Skeleton: C4 through C7 anterior fusion and discectomy. Hardware appears intact without apparent hardware related complication. Multilevel prominent facet arthrosis. No high-grade bony canal stenosis. Other neck: Negative. Upper chest: Moderate centrilobular emphysema and mild biapical pleuroparenchymal scarring. Review of the MIP images confirms the above findings CTA HEAD FINDINGS Anterior circulation: Occlusion of the internal carotid artery to the distal cavernous segment. Paraclinoid and terminal left ICA segments are patent as is the very proximal left M1 likely due to blood flow via a diminutive left posterior communicating artery. Left mid and distal M1 occlusion. Poor left MCA distribution collateralization. Left A1 occlusion. Patent bilateral A2 and symmetric ACA circulation. The left distal ACA is likely perfused via the anterior communicating artery. Patent right MCA. Posterior circulation: No significant stenosis, proximal occlusion, aneurysm, or vascular  malformation. The segments of mild stenosis in the left V4 segment. Venous sinuses: As permitted by contrast timing, patent. Anatomic variants: Bilateral posterior and anterior communicating  artery are present. Delayed phase: No abnormal intracranial enhancement. Review of the MIP images confirms the above findings IMPRESSION: CT head: 1. Focal hypodensity in left insula and left posterior putamen compatible with infarction. 2. Hyperdensity and distal left M1. 3. ASPECTS is 8 CTA neck: 1. Occlusion of the left common and internal carotid artery from the arch to the distal cavernous segment. 2. Patent right common carotid system and vertebral arteries without dissection, aneurysm, or significant stenosis. 3. Moderate calcific atherosclerosis of the aortic arch 4. Centrilobular emphysema of lung apices. CTA head: 1. Patent paraclinoid and terminal segments of left internal carotid artery and minimal flow in proximal left M1 likely retrograde via a tiny left posterior communicating artery. 2. Occlusion of mid and distal left M1 with poor left MCA collateralization. 3. Left A1 occlusion. Bilateral A2 are patent with the left likely perfused via the anterior communicating artery. Symmetric distal ACA circulation. 4. Patent right MCA and posterior circulation. 5. No intracranial aneurysm identified. These results were called by telephone at the time of interpretation on 02/14/2017 at 8:56 pm to Dr. Doug Sou ; [facility] , who verbally acknowledged these results. Electronically Signed   By: Mitzi Hansen M.D.   On: 02/14/2017 21:08   Ct Cerebral Perfusion W Contrast  Result Date: 02/14/2017 CLINICAL DATA:  71 y/o  F; CT perfusion due to stroke. EXAM: CT PERFUSION BRAIN TECHNIQUE: Multiphase CT imaging of the brain was performed following IV bolus contrast injection. Subsequent parametric perfusion maps were calculated using RAPID software. CONTRAST:  50 cc Isovue 370 COMPARISON:  CT angiogram head and  neck dated 02/14/2017 FINDINGS: CT Brain Perfusion Findings: CBF (<30%) Volume: 0mL Perfusion (Tmax>6.0s) volume: 33mL Mismatch Volume: 33mL Infarction Location:Left MCA distribution IMPRESSION: 1. Left MCA distribution perfusion anomaly. Ischemic penumbra of 33 cc calculated by mismatch volume. 2. 0 cc infarct core (CBF <30%) by perfusion. This does not included the infarct core volume of the ASPECT 8 infarct on non contrast CT of the head involving the part of the left insula and putamen as visible infarct on noncontrast CT can be pseudo normalized on perfusion. These results were called by telephone at the time of interpretation on 02/14/2017 at 10:01 pm to Dr. Caryl Pina , who verbally acknowledged these results. Electronically Signed   By: Mitzi Hansen M.D.   On: 02/14/2017 22:01   Portable Chest Xray  Result Date: 02/15/2017 CLINICAL DATA:  Acute hypoxemic respiratory failure. Endotracheal tube placement. EXAM: PORTABLE CHEST 1 VIEW COMPARISON:  04/26/2012 CXR FINDINGS: The tip of an endotracheal tube is 5.1 cm above the carina in satisfactory position. A gastric tube is seen extending below the left hemidiaphragm with side port below the GE junction. Heart is top-normal in size with aortic atherosclerosis. Streaky atelectasis at the left lung base medially. No overt pulmonary edema. No pneumonic consolidation. ACDF of the lower cervical spine. IMPRESSION: 1. ACDF of the lower cervical spine. 2. Satisfactory support line and tube positions. 3. Atelectasis and/or scarring at the left lung base. 4. Aortic atherosclerosis. Electronically Signed   By: Tollie Eth M.D.   On: 02/15/2017 03:16   Ct Head Code Stroke W/o Cm  Result Date: 02/14/2017 CLINICAL DATA:  Code stroke. 71 y/o F; slurred speech and right-sided weakness. EXAM: CT ANGIOGRAPHY HEAD AND NECK TECHNIQUE: Multidetector CT imaging of the head and neck was performed using the standard protocol during bolus administration of  intravenous contrast. Multiplanar CT image reconstructions and MIPs were obtained to evaluate the vascular  anatomy. Carotid stenosis measurements (when applicable) are obtained utilizing NASCET criteria, using the distal internal carotid diameter as the denominator. CONTRAST:  50 cc Isovue 370 COMPARISON:  04/26/2012 CT head FINDINGS: CT HEAD FINDINGS Brain: Focal loss of gray-white differentiation in the left insula (series 5, image 30) hypoattenuation within left posterior putamen. Stable background of moderate chronic microvascular ischemic changes the brain and mild brain parenchymal volume loss. No evidence for intracranial hemorrhage, hydrocephalus, or mass effect. Vascular: Left distal M1 hyperdensity (series 5, image 28). Skull: Normal. Negative for fracture or focal lesion. Sinuses/Orbits: No acute finding. Other: None. ASPECTS [facility] Stroke Program Early CT Score) - Ganglionic level infarction (caudate, lentiform nuclei, internal capsule, insula, M1-M3 cortex): 5 - Supraganglionic infarction (M4-M6 cortex): 3 Total score (0-10 with 10 being normal): 8 CTA NECK FINDINGS Aortic arch: Standard branching. Imaged portion shows no evidence of aneurysm or dissection. No significant stenosis of the major arch vessel origins. Moderate calcific atherosclerosis. Right carotid system: No evidence of dissection, stenosis (50% or greater) or occlusion. Mild calcific atherosclerosis of the bifurcation. 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 Patent left external carotid artery, likely via collateralization. Vertebral arteries: Right dominant. No evidence of dissection, stenosis (50% or greater) or occlusion. Skeleton: C4 through C7 anterior fusion and discectomy. Hardware appears intact without apparent hardware related complication. Multilevel prominent facet arthrosis. No high-grade bony canal stenosis. Other neck: Negative. Upper chest: Moderate  centrilobular emphysema and mild biapical pleuroparenchymal scarring. Review of the MIP images confirms the above findings CTA HEAD FINDINGS Anterior circulation: Occlusion of the internal carotid artery to the distal cavernous segment. Paraclinoid and terminal left ICA segments are patent as is the very proximal left M1 likely due to blood flow via a diminutive left posterior communicating artery. Left mid and distal M1 occlusion. Poor left MCA distribution collateralization. Left A1 occlusion. Patent bilateral A2 and symmetric ACA circulation. The left distal ACA is likely perfused via the anterior communicating artery. Patent right MCA. Posterior circulation: No significant stenosis, proximal occlusion, aneurysm, or vascular malformation. The segments of mild stenosis in the left V4 segment. Venous sinuses: As permitted by contrast timing, patent. Anatomic variants: Bilateral posterior and anterior communicating artery are present. Delayed phase: No abnormal intracranial enhancement. Review of the MIP images confirms the above findings IMPRESSION: CT head: 1. Focal hypodensity in left insula and left posterior putamen compatible with infarction. 2. Hyperdensity and distal left M1. 3. ASPECTS is 8 CTA neck: 1. Occlusion of the left common and internal carotid artery from the arch to the distal cavernous segment. 2. Patent right common carotid system and vertebral arteries without dissection, aneurysm, or significant stenosis. 3. Moderate calcific atherosclerosis of the aortic arch 4. Centrilobular emphysema of lung apices. CTA head: 1. Patent paraclinoid and terminal segments of left internal carotid artery and minimal flow in proximal left M1 likely retrograde via a tiny left posterior communicating artery. 2. Occlusion of mid and distal left M1 with poor left MCA collateralization. 3. Left A1 occlusion. Bilateral A2 are patent with the left likely perfused via the anterior communicating artery. Symmetric distal  ACA circulation. 4. Patent right MCA and posterior circulation. 5. No intracranial aneurysm identified. These results were called by telephone at the time of interpretation on 02/14/2017 at 8:56 pm to Dr. Doug Sou ; [facility] , who verbally acknowledged these results. Electronically Signed   By: Mitzi Hansen M.D.   On: 02/14/2017 21:08  Labs:  CBC:  Recent Labs  02/14/17 2030 02/14/17 2034 02/15/17 0558  WBC 7.6  --  10.6*  HGB 12.2 12.6 9.8*  HCT 37.3 37.0 29.9*  PLT 203  --  218    COAGS:  Recent Labs  02/14/17 2030  INR 0.94  APTT 25    BMP:  Recent Labs  02/14/17 2030 02/14/17 2034  NA 138 139  K 4.0 4.0  CL 107 107  CO2 20*  --   GLUCOSE 96 94  BUN 23* 26*  CALCIUM 9.1  --   CREATININE 0.84 1.00  GFRNONAA >60  --   GFRAA >60  --     LIVER FUNCTION TESTS:  Recent Labs  02/14/17 2030  BILITOT 0.4  AST 25  ALT 20  ALKPHOS 58  PROT 6.7  ALBUMIN 4.0    Assessment and Plan:  CVA L MCA revascularization; L ICA angioplasty/stent Plan per Neuro Check P2y12 Right sheath for pull today Will follow   Electronically Signed: Lus Kriegel A, PA-C 02/15/2017, 10:42 AM   I spent a total of 15 Minutes at the the patient's bedside AND on the patient's hospital floor or unit, greater than 50% of which was counseling/coordinating care for CVA

## 2017-02-15 NOTE — Progress Notes (Signed)
PT Cancellation Note  Patient Details Name: Gabrelle Dority MRN: [id number] DOB: 07/22/1946   Cancelled Treatment:    Reason Eval/Treat Not Completed: Patient not medically ready. Pt currently on bedrest. Please update activity orders when appropriate to initiate PT. Thank you,   Marylynn Pearson 02/15/2017, 9:37 AM  Conni Slipper, PT, DPT Acute Rehabilitation Services Pager: [phone]

## 2017-02-15 NOTE — Procedures (Addendum)
S/P bilateral common carotid arteriograms and Rt Vertebral arteriogram followed by complete revascularization of occluded Lt MCA  With x 1 pass with 9mm x 40 mm Solitaire FR retrieval device with TICI 3 restoration. S/P  Stent assisted angioplasty of  symptomatic acute  occlusion of prox Lt ICA And 8.1 mg of IA Integrelin

## 2017-02-15 NOTE — Progress Notes (Signed)
OT Cancellation Note  Patient Details Name: Kaitlin [name] MRN: [id number] DOB: 12/05/1945   Cancelled Treatment:    Reason Eval/Treat Not Completed: Patient not medically ready. Pt on bedrest. Please update activity orders when appropriate to initiate OT. Thanks.   [facility] Clinton Dragone, OT/L  [phone] 02/15/2017 02/15/2017, 7:14 AM

## 2017-02-15 NOTE — Care Management Note (Signed)
Case Management Note  Patient Details  Name: Kaitlin [name] MRN: [id number] Date of Birth: 21-Oct-1945  Subjective/Objective:   Pt admitted on 02/14/17 with Rt MCA occlusion.  PTA, pt resided at home with spouse.                   Action/Plan: Pt currently remains intubated.  Will follow for discharge planning as pt progresses.    Expected Discharge Date:                  Expected Discharge Plan:  IP Rehab Facility  In-House Referral:     Discharge planning Services  CM Consult  Post Acute Care Choice:    Choice offered to:     DME Arranged:    DME Agency:     HH Arranged:    HH Agency:     Status of Service:  In process, will continue to follow  If discussed at Long Length of Stay Meetings, dates discussed:    Additional Comments:  Quintella Baton, RN, BSN  Trauma/Neuro ICU Case Manager [phone]

## 2017-02-15 NOTE — Progress Notes (Signed)
Patient ID: Kaitlin [name], female   DOB: 1945-11-01, 71 y.o.   MRN: [id number] Rt handed female. LSW at about 11 am when she became  Dysarthric and wealk on the Rt side.Improved thoght not completely as per husband.Worsened at 700pm. CT early ischemic changes in the lt perisylvian subcortical area.  CT brain No ICH. ASPECTS of 8.mrs score 0 CT perfusion CBF<30 % vol 19ml,Tmax >6s vol 35ml. Mismatch 71ml mismatch  Ratioinfinite.  findings reviewed with spouse. Option of endovascular revascularization  Under anesthesia to prevent further ischemic injury discussed with patients spouse. Procedure ,risks benefits ,alternatives reviewed. Risk of ICH of 10%,vent dependency,neurologic decline, Death and inability to revascularize all reviewed. Questions answered.Informed consent obtained for endovascular revascularization. S.[name].MD

## 2017-02-15 NOTE — Progress Notes (Signed)
STROKE TEAM PROGRESS NOTE   HISTORY OF PRESENT ILLNESS (per record) HPI: Kaitlin [name] is an 71 y.o. female with a 20 year history of MS, hyperlipidemia, and CAD with stent in 2000, who presented as a Code Stroke for right sided weakness and garbled speech. Time of symptom onset was 11 AM on 02/14/2017 while working in the yard, at which point she began to feel very hot and could not speak. She took a shower and symptoms seemed almost completely to resolve per husband. Subsequently, she had a nap and then at about 1700 she and her husband had a few glasses of wine, at which point he noticed that she was having some difficulty using her right side and it "was hard for her to get up", the latter symptom noted at about 1900.  CT head with focal hypodensity in left insula and left posterior putamen compatible with infarction with ASPECTS of 8. Also noted was hyperdensity of the distal left M1.  CTA neck with occluded left common and internal carotid artery from the arch to the distal cavernous segment.  CTA head with minimal flow in proximal left M1 likely retrograde via a tiny left PCA , occluded distal left M1 with poor left MCA collateralization, and left A1 occlusion with likely perfusion from ACA.  S/p thrombectomy on 02/14/2017 with good recannulization of L ICA and L M1 segment on MRA today.    LSN: 11 AM on 02/14/2017  Patient was not administered IV t-PA secondary to arriving outside of treatment window. She was admitted to the neuro ICU for further evaluation and treatment.   SUBJECTIVE (INTERVAL HISTORY) Her husband is at the bedside.  The patient is intubated and sedated.  The exam was conducted with sedation just turned off.  The patient barely opened her eyes to voice, and follow commands intermittently with frequent prompting.    OBJECTIVE Temp:  [96.3 F (35.7 C)-98.4 F (36.9 C)] 98.4 F (36.9 C) (07/17 0700) Pulse Rate:  [45-78] 45 (07/17 0700) Cardiac Rhythm: Sinus  bradycardia;Normal sinus rhythm (07/17 0300) Resp:  [15-22] 20 (07/17 0700) BP: (84-188)/(37-75) 124/41 (07/17 0700) SpO2:  [94 %-100 %] 100 % (07/17 0700) Arterial Line BP: (90-142)/(26-61) 141/39 (07/17 0700) FiO2 (%):  [40 %-60 %] 40 % (07/17 0325) Weight:  [56.2 kg (124 lb)-59.1 kg (130 lb 4.7 oz)] 56.2 kg (124 lb) (07/16 2100)  CBC:  Recent Labs Lab 02/14/17 2030 02/14/17 2034 02/15/17 0558  WBC 7.6  --  10.6*  NEUTROABS 4.4  --  8.9*  HGB 12.2 12.6 9.8*  HCT 37.3 37.0 29.9*  MCV 96.6  --  97.1  PLT 203  --  218    Basic Metabolic Panel:  Recent Labs Lab 02/14/17 2030 02/14/17 2034  NA 138 139  K 4.0 4.0  CL 107 107  CO2 20*  --   GLUCOSE 96 94  BUN 23* 26*  CREATININE 0.84 1.00  CALCIUM 9.1  --     Lipid Panel:    Component Value Date/Time   CHOL 173 02/15/2017 0420   TRIG 145 02/15/2017 0420   HDL 70 02/15/2017 0420   CHOLHDL 2.5 02/15/2017 0420   VLDL 29 02/15/2017 0420   LDLCALC 74 02/15/2017 0420   HgbA1c: No results found for: HGBA1C Urine Drug Screen:    Component Value Date/Time   LABOPIA NONE DETECTED 02/14/2017 2124   COCAINSCRNUR NONE DETECTED 02/14/2017 2124   LABBENZ NONE DETECTED 02/14/2017 2124   AMPHETMU NONE DETECTED 02/14/2017 2124  THCU NONE DETECTED 02/14/2017 2124   LABBARB NONE DETECTED 02/14/2017 2124    Alcohol Level     Component Value Date/Time   ETH 152 (H) 02/14/2017 2030    IMAGING I have personally reviewed the radiological images below and agree with the radiology interpretations.  Ct Angio Head/Neck W and Wo Contrast 02/14/2017 IMPRESSION: CT head: 1. Focal hypodensity in left insula and left posterior putamen compatible with infarction. 2. Hyperdensity and distal left M1. 3. ASPECTS is 8 CTA neck: 1. Occlusion of the left common and internal carotid artery from the arch to the distal cavernous segment. 2. Patent right common carotid system and vertebral arteries without dissection, aneurysm, or significant  stenosis. 3. Moderate calcific atherosclerosis of the aortic arch 4. Centrilobular emphysema of lung apices. CTA head: 1. Patent paraclinoid and terminal segments of left internal carotid artery and minimal flow in proximal left M1 likely retrograde via a tiny left posterior communicating artery. 2. Occlusion of mid and distal left M1 with poor left MCA collateralization. 3. Left A1 occlusion. Bilateral A2 are patent with the left likely perfused via the anterior communicating artery. Symmetric distal ACA circulation. 4. Patent right MCA and posterior circulation. 5. No intracranial aneurysm identified.   Ct Head Wo Contrast 02/15/2017 IMPRESSION: 1. Contrast staining versus blood products LEFT basal ganglia to LEFT temporal lobe. Recommend 6-12 hour follow-up CT. 2. Moderate to severe chronic small vessel ischemic disease. T  Mr Brain 45 Contrast Mr Maxine Glenn Head/brain Wo Cm 02/15/2017 IMPRESSION: 1. Acute confluent infarct involving the left putamen, caudate body, and corona radiata. 2 tiny cortical infarcts in the left MCA distribution. 2. Extensive chronic white matter disease, some combination of patient's multiple sclerosis and chronic microvascular ischemia. 3. Continued good patency of recently re- cannulized left ICA and M1 segment. 4. Mild left M1 segment irregularity. Moderate left M2 inferior division stenosis. 5. Moderate to advanced stenosis of the left V4 segment.   Ct Cerebral Perfusion W Contrast 02/14/2017 IMPRESSION: 1. Left MCA distribution perfusion anomaly. Ischemic penumbra of 33 cc calculated by mismatch volume. 2. 0 cc infarct core (CBF <30%) by perfusion. This does not included the infarct core volume of the ASPECT 8 infarct on non contrast CT of the head involving the part of the left insula and putamen as visible infarct on noncontrast CT can be pseudo normalized on perfusion.   Portable Chest Xray 02/15/2017 IMPRESSION: 1. ACDF of the lower cervical spine. 2. Satisfactory support  line and tube positions. 3. Atelectasis and/or scarring at the left lung base. 4. Aortic atherosclerosis.  Ct Head Code Stroke W/o Cm 02/14/2017 IMPRESSION: CT head: 1. Focal hypodensity in left insula and left posterior putamen compatible with infarction. 2. Hyperdensity and distal left M1. 3. ASPECTS is 8 CTA neck: 1. Occlusion of the left common and internal carotid artery from the arch to the distal cavernous segment. 2. Patent right common carotid system and vertebral arteries without dissection, aneurysm, or significant stenosis. 3. Moderate calcific atherosclerosis of the aortic arch 4. Centrilobular emphysema of lung apices. CTA head: 1. Patent paraclinoid and terminal segments of left internal carotid artery and minimal flow in proximal left M1 likely retrograde via a tiny left posterior communicating artery. 2. Occlusion of mid and distal left M1 with poor left MCA collateralization. 3. Left A1 occlusion. Bilateral A2 are patent with the left likely perfused via the anterior communicating artery. Symmetric distal ACA circulation. 4. Patent right MCA and posterior circulation. 5. No intracranial aneurysm identified.  2D Echo pending  LE venous doppler pending   PHYSICAL EXAM  Temp:  [96.3 F (35.7 C)-100.4 F (38 C)] 100 F (37.8 C) (07/17 2030) Pulse Rate:  [30-78] 53 (07/17 2030) Resp:  [12-22] 20 (07/17 2030) BP: (75-188)/(29-75) 108/50 (07/17 2030) SpO2:  [88 %-100 %] 100 % (07/17 2030) Arterial Line BP: (78-165)/(26-91) 143/43 (07/17 2030) FiO2 (%):  [40 %-60 %] 40 % (07/17 1946) Weight:  [124 lb (56.2 kg)] 124 lb (56.2 kg) (07/16 2100)  General - Well nourished, well developed, intubated on sedation, just turned off.  Ophthalmologic - Fundi not visualized due to noncooperation.  Cardiovascular - Regular rate and rhythm.  Neuro - intubated just off sedation. Barely opened eyes on voice, follows simple commands intermittently. Left gaze preference, barely cross midline.  Not blinking to visual threat bilaterally. PERRL. Facial symmetry not able to assess due to ET tube. RUE 0/5, RLE not tested due to femoral sheath. LUE follows commands and against gravity and localizing to pain, LLE withdraw to pain and able to do PF strongly. DTR 1+ and right positive babinski. Sensation, coordination and gait not tested.   ASSESSMENT/PLAN [name] is a 71 y.o. female with a  20 year history of MS, hyperlipidemia, and CAD with stent in 2000, who presented as a Code Stroke for right sided weakness and garbled speech. She did not receive IV t-PA due to arriving outside of the treatment window.   Stroke:  Acute MCA infarct involving the left putamen, caudate body, and corona radiata, as well as tiny cortical infarcts, due to left CCA and MCA occlusion, s/p mechanical thrombectomy and left ICA stenting embolic pattern, source unclear  Resultant  Intubated, right hemiplegia  CT head: Contrast staining versus blood products LEFT basal ganglia to LEFT temporal lobe.   CTA head and neck: left CCA origin occlusion, A1 and distal M1 occlusion  MRI head: Acute confluent infarct involving the left putamen, caudate body, and corona radiata, as well as tiny cortical infarcts in the left MCA distribution.    MRA head: Mild left M1 segment irregularity, moderate left M2 inferior division stenosis.   2D Echo  pending  LE venous doppler pending  LDL 74  HgbA1c pending  lovenox for VTE prophylaxis  Diet NPO time specified  aspirin 81 mg daily prior to admission, now on aspirin 325 mg daily and clopidogrel 75 mg daily  Patient counseled to be compliant with her antithrombotic medications  Ongoing aggressive stroke risk factor management  Therapy recommendations:  pending  Disposition:  Pending  Left CCA occlusion  Etiology unclear  Concerning for cardiac thrombus  S/p left ICA stent  TEE and loop after stabilization  Hypotension  Unstable  On  phenylephrine gtt  BP goal 120-140 post IR  Long-term BP goal normotensive  Hyperlipidemia  Home meds: none  LDL 74, goal < 70  Add atorvastatin 40mg  PO daily  Continue statin at discharge  Other Stroke Risk Factors  Advanced age  ETOH use, advised to drink no more than 1 drink(s) a day  Coronary artery disease  Other Active Problems  Sinus bradycardia: average pulse in 50s, T-wave inversion in anterolateral leads, troponin negative, echo pending  Multiple sclerosis: follows neurology in [place], was on Betaseron, no flare up, took off 11/2015 due to elevated LFT  Hospital day # 1  This patient is critically ill due to CCA occlusion, MCA occlusion, s/p mechanical thrombectomy and ICA stent and at significant risk of neurological worsening, death form  recurrent stroke, hemorrhagic conversion, seizure, cerebral edema. This patient's care requires constant monitoring of vital signs, hemodynamics, respiratory and cardiac monitoring, review of multiple databases, neurological assessment, discussion with family, other specialists and medical decision making of high complexity. I spent 45 minutes of neurocritical care time in the care of this patient. I had long discussion with husband at bedside, updated pt current condition, treatment plan and potential prognosis. He expressed understanding and appreciation.   Marvel Plan, MD PhD Stroke Neurology 02/15/2017 9:09 PM   To contact Stroke Continuity provider, please refer to [email or web address]. After hours, contact General Neurology

## 2017-02-15 NOTE — Progress Notes (Signed)
Patient transported on vent from CT to 4N-21 without complication.

## 2017-02-15 NOTE — Anesthesia Postprocedure Evaluation (Signed)
Anesthesia Post Note  Patient: Kaitlin [name]  Procedure(s) Performed: Procedure(s) (LRB): RADIOLOGY WITH ANESTHESIA (N/A)     Patient location during evaluation: SICU Anesthesia Type: General Level of consciousness: sedated Pain management: pain level controlled Vital Signs Assessment: post-procedure vital signs reviewed and stable Respiratory status: patient remains intubated per anesthesia plan Cardiovascular status: stable Anesthetic complications: no    Last Vitals:  Vitals:   02/15/17 0530 02/15/17 0600  BP: (!) 109/37 (!) 119/39  Pulse: (!) 47 (!) 45  Resp: 20 20  Temp: 36.8 C 36.9 C    Last Pain:  Vitals:   02/15/17 0530  TempSrc:   PainSc: 0-No pain    LLE Motor Response: Non-purposeful movement (02/15/17 0600)   RLE Motor Response: No movement to painful stimulus (02/15/17 0600)        Kareema Keitt DANIEL

## 2017-02-16 ENCOUNTER — Inpatient Hospital Stay (HOSPITAL_COMMUNITY): Payer: Medicare Other

## 2017-02-16 ENCOUNTER — Encounter (HOSPITAL_COMMUNITY): Payer: Self-pay | Admitting: Interventional Radiology

## 2017-02-16 DIAGNOSIS — I341 Nonrheumatic mitral (valve) prolapse: Secondary | ICD-10-CM

## 2017-02-16 DIAGNOSIS — J9600 Acute respiratory failure, unspecified whether with hypoxia or hypercapnia: Secondary | ICD-10-CM

## 2017-02-16 DIAGNOSIS — I63232 Cerebral infarction due to unspecified occlusion or stenosis of left carotid arteries: Secondary | ICD-10-CM

## 2017-02-16 LAB — CBC
HEMATOCRIT: 29.2 % — AB (ref 36.0–46.0)
Hemoglobin: 9.2 g/dL — ABNORMAL LOW (ref 12.0–15.0)
MCH: 31.6 pg (ref 26.0–34.0)
MCHC: 31.5 g/dL (ref 30.0–36.0)
MCV: 100.3 fL — AB (ref 78.0–100.0)
Platelets: 162 10*3/uL (ref 150–400)
RBC: 2.91 MIL/uL — AB (ref 3.87–5.11)
RDW: 14 % (ref 11.5–15.5)
WBC: 10 10*3/uL (ref 4.0–10.5)

## 2017-02-16 LAB — BASIC METABOLIC PANEL
Anion gap: 5 (ref 5–15)
BUN: 25 mg/dL — ABNORMAL HIGH (ref 6–20)
CALCIUM: 8.2 mg/dL — AB (ref 8.9–10.3)
CO2: 21 mmol/L — AB (ref 22–32)
CREATININE: 0.65 mg/dL (ref 0.44–1.00)
Chloride: 115 mmol/L — ABNORMAL HIGH (ref 101–111)
GFR calc Af Amer: >60 mL/min (ref >60)
GFR calc non Af Amer: >60 mL/min (ref >60)
GLUCOSE: 114 mg/dL — AB (ref 65–99)
Potassium: 4.3 mmol/L (ref 3.5–5.1)
Sodium: 141 mmol/L (ref 135–145)

## 2017-02-16 LAB — HEMOGLOBIN A1C
Hgb A1c MFr Bld: 5.2 % (ref 4.8–5.6)
Mean Plasma Glucose: 103 mg/dL

## 2017-02-16 LAB — PLATELET INHIBITION P2Y12: Platelet Function  P2Y12: 266 [PRU] (ref 194–418)

## 2017-02-16 LAB — ECHOCARDIOGRAM COMPLETE
Height: 62 in
WEIGHTICAEL: 1984 [oz_av]

## 2017-02-16 LAB — MAGNESIUM: Magnesium: 2.1 mg/dL (ref 1.7–2.4)

## 2017-02-16 MED ORDER — ASPIRIN EC 81 MG PO TBEC
81.0000 mg | DELAYED_RELEASE_TABLET | Freq: Every day | ORAL | Status: DC
  Administered 2017-02-17 – 2017-02-19 (×3): 81 mg via ORAL
  Filled 2017-02-16 (×4): qty 1

## 2017-02-16 MED ORDER — TICAGRELOR 90 MG PO TABS
90.0000 mg | ORAL_TABLET | Freq: Two times a day (BID) | ORAL | Status: DC
  Administered 2017-02-16 – 2017-02-19 (×6): 90 mg via ORAL
  Filled 2017-02-16 (×8): qty 1

## 2017-02-16 NOTE — Progress Notes (Signed)
At about 3:45, pt became severely agitated, pulling leads off, telling husband to take her home. She is actively trying to get out of bed. She is not redirectable. She is currently requiring a hands on safety attendant. Staffing office and MDs notified. Staffingf office unable to provide safety attendant at this time. Helen NT currently in room for pt safety.

## 2017-02-16 NOTE — Consult Note (Signed)
PULMONARY / CRITICAL CARE MEDICINE   Name: Kaitlin [name] MRN: [id number] DOB: Dec 06, 1945    ADMISSION DATE:  02/14/2017 CONSULTATION DATE:  02/15/17  REFERRING MD:  Otelia Limes  CHIEF COMPLAINT:  AMS rt review. BRIEF PATIENT SUMMARY:   Kaitlin [name] is a 71 y.o. F with PMH of  HLD and MS.  She presented to [facility] ED 7/17 with right sided weakness and aphasia.  She was working in the yard with husband around 11 am the morning prior and got very hot and was not talking normally.  She later took a shower and ate lunch and acted normal for the most part.  Later she was unable to get up and had right sided weakness.  She was brought to [facility] where CT demonstrated left MCA occlusion.  She was intubated and was taken to IR where she had b/l common carotid arteriograms and right verterbral arteriogram with complete revascularization of occluded left MCA and stent assisted angioplasty of symptomatic acute occlusion of prox left ICA.  She returned to the ICU on the vent and PCCM was asked to assist with vent management.  SUBJECTIVE:   Fentanyl gtt off at 0700, remains on Neo at 50mcg/min No acute events overnight  VITAL SIGNS: BP (!) 105/40 Comment: Titrating pressors prn; using artline as bp guide  Pulse (!) 47   Temp 99.3 F (37.4 C)   Resp 20   Ht 5\' 2"  (1.575 m)   Wt 124 lb (56.2 kg)   SpO2 100%   BMI 22.68 kg/m   HEMODYNAMICS:   VENTILATOR SETTINGS: Vent Mode: PRVC FiO2 (%):  [40 %] 40 % Set Rate:  [20 bmp] 20 bmp Vt Set:  [400 mL] 400 mL PEEP:  [5 cmH20] 5 cmH20 Plateau Pressure:  [14 cmH20-20 cmH20] 20 cmH20  INTAKE / OUTPUT: I/O last 3 completed shifts: In: 7649.5 [I.V.:7649.5] Out: 3980 [Urine:3330; Emesis/NG output:500; Blood:150]  PHYSICAL EXAMINATION: General:  Adult female lying in bed in NAD HEENT: [place]/AT, ETT/OGT, pupils 2/+/reactive Neuro: sleepy, awakens to voice, follows commands, MAE L> R CV: SR with occ PAC, s1s2 rrr, no m/r/g PULM: even/non-labored on MV,  lungs  bilaterally clear LF:YBOF, non-tender, bsx4 active  Extremities: warm/dry, no edema, right groin site wnl, 2+ dp Skin: no rashes or lesions  LABS:  BMET  Recent Labs Lab 02/14/17 2030 02/14/17 2034  NA 138 139  K 4.0 4.0  CL 107 107  CO2 20*  --   BUN 23* 26*  CREATININE 0.84 1.00  GLUCOSE 96 94    Electrolytes  Recent Labs Lab 02/14/17 2030  CALCIUM 9.1    CBC  Recent Labs Lab 02/14/17 2030 02/14/17 2034 02/15/17 0558  WBC 7.6  --  10.6*  HGB 12.2 12.6 9.8*  HCT 37.3 37.0 29.9*  PLT 203  --  218    Coag's  Recent Labs Lab 02/14/17 2030  APTT 25  INR 0.94    Sepsis Markers No results for input(s): LATICACIDVEN, PROCALCITON, O2SATVEN in the last 168 hours.  ABG  Recent Labs Lab 02/15/17 0322  PHART 7.245*  PCO2ART 44.1  PO2ART 192.0*    Liver Enzymes  Recent Labs Lab 02/14/17 2030  AST 25  ALT 20  ALKPHOS 58  BILITOT 0.4  ALBUMIN 4.0    Cardiac Enzymes  Recent Labs Lab 02/15/17 0800 02/15/17 1458 02/15/17 2015  TROPONINI <0.03 0.03* 0.03*    Glucose No results for input(s): GLUCAP in the last 168 hours.  Imaging Mr Brain Wo Contrast  Result Date: 02/15/2017 CLINICAL DATA:  Slurred speech and right-sided weakness EXAM: MRI HEAD WITHOUT CONTRAST MRA HEAD WITHOUT CONTRAST TECHNIQUE: Multiplanar, multiecho pulse sequences of the brain and surrounding structures were obtained without intravenous contrast. Angiographic images of the head were obtained using MRA technique without contrast. COMPARISON:  Head CT and CTA from earlier today FINDINGS: MRI HEAD FINDINGS Brain: Restricted diffusion in the left putamen, caudate body, and intervening corona radiata. At least 2 punctate cortical infarcts along the posterior left frontal convexity. No hemorrhage by MRI. No hydrocephalus or masslike findings. Extensive FLAIR hyperintensity in the cerebral white matter. Milder changes are seen in the pons. Vascular: Vascular findings described  below. Skull and upper cervical spine: Negative for marrow lesion Sinuses/Orbits: Bilateral cataract resection. MRA HEAD FINDINGS Recannulized left ICA which is smooth and widely patent. Mild vessel irregularity of the left M1 segment, but no flow limiting stenosis. There is a moderate stenosis involving the left inferior division M2 branch. Left MCA branches are less robustly opacified. Aplastic left A1 segment. No suspected superimposed acute A1 occlusion based on prior CTA. Right MCA branches are widely patent. Mildly dominant right vertebral artery. Mid left V4 segment stenosis, at least moderate. Basilar artery is smooth and widely patent. Small posterior communicating arteries are present. Both PCAs have symmetric robust flow. Incidental fenestration at the vertebrobasilar junction. IMPRESSION: 1. Acute confluent infarct involving the left putamen, caudate body, and corona radiata. 2 tiny cortical infarcts in the left MCA distribution. 2. Extensive chronic white matter disease, some combination of patient's multiple sclerosis and chronic microvascular ischemia. 3. Continued good patency of recently re- cannulized left ICA and M1 segment. 4. Mild left M1 segment irregularity. Moderate left M2 inferior division stenosis. 5. Moderate to advanced stenosis of the left V4 segment. Electronically Signed   By: Marnee Spring M.D.   On: 02/15/2017 14:45   Mr Maxine Glenn Head/brain UX Cm  Result Date: 02/15/2017 CLINICAL DATA:  Slurred speech and right-sided weakness EXAM: MRI HEAD WITHOUT CONTRAST MRA HEAD WITHOUT CONTRAST TECHNIQUE: Multiplanar, multiecho pulse sequences of the brain and surrounding structures were obtained without intravenous contrast. Angiographic images of the head were obtained using MRA technique without contrast. COMPARISON:  Head CT and CTA from earlier today FINDINGS: MRI HEAD FINDINGS Brain: Restricted diffusion in the left putamen, caudate body, and intervening corona radiata. At least 2  punctate cortical infarcts along the posterior left frontal convexity. No hemorrhage by MRI. No hydrocephalus or masslike findings. Extensive FLAIR hyperintensity in the cerebral white matter. Milder changes are seen in the pons. Vascular: Vascular findings described below. Skull and upper cervical spine: Negative for marrow lesion Sinuses/Orbits: Bilateral cataract resection. MRA HEAD FINDINGS Recannulized left ICA which is smooth and widely patent. Mild vessel irregularity of the left M1 segment, but no flow limiting stenosis. There is a moderate stenosis involving the left inferior division M2 branch. Left MCA branches are less robustly opacified. Aplastic left A1 segment. No suspected superimposed acute A1 occlusion based on prior CTA. Right MCA branches are widely patent. Mildly dominant right vertebral artery. Mid left V4 segment stenosis, at least moderate. Basilar artery is smooth and widely patent. Small posterior communicating arteries are present. Both PCAs have symmetric robust flow. Incidental fenestration at the vertebrobasilar junction. IMPRESSION: 1. Acute confluent infarct involving the left putamen, caudate body, and corona radiata. 2 tiny cortical infarcts in the left MCA distribution. 2. Extensive chronic white matter disease, some combination of patient's multiple sclerosis and chronic microvascular ischemia. 3. Continued good  patency of recently re- cannulized left ICA and M1 segment. 4. Mild left M1 segment irregularity. Moderate left M2 inferior division stenosis. 5. Moderate to advanced stenosis of the left V4 segment. Electronically Signed   By: Marnee Spring M.D.   On: 02/15/2017 14:45     STUDIES:  CTA head 7/17 > focal hypodensity in left insula and left posterior putamen.  Left MCA perfusion anomaly.  CT head 7/17 > contrast staining vs blood products left basal ganglia to left temporal lobe. MRI/MRA 7/17> acute confluent infarct involving left putamen, caudate body, and corona  radiata; 2 tiny cortical infarcts in the left MCA distribution; extensive chronic white matter disease, combination of patient's MS and chronic microvascular ischemia; continued good patency of recently re-cannulized left ICA and M1 segment; mild left M1 segment irregularity.  Moderate left M2 inferior division stenosis; mod to advanced stenosis of left V4 segment  CULTURES: None.  ANTIBIOTICS: None.  SIGNIFICANT EVENTS: 7/17 > admit.  Taken to IR for b/l common carotid arteriograms and right verterbral arteriogram with complete revascularization of occluded left MCA and stent assisted angioplasty of symptomatic acute occlusion of prox left ICA.  LINES/TUBES: ETT 7/17 >  OGT 7/17 > R groin sheath 7/17 >7/17 at 1644 Right Aline 7/17 >  DISCUSSION: 71 y.o. F admitted with left MCA stroke, taken to IR for revascularization and L ICA stent, then returned to ICU on vent.  ASSESSMENT / PLAN: NEUROLOGIC A:   Acute L MCA stroke S/p b/l CCA and R vertebral arteriogram with L ICA stent Hx MS P:   RASS goal: 0, -1 Fentanyl prn  PRN versed Daily WUA Management and further imaging per Neurology  Continue ASA, plavix, atorvastatin Continue Neurontin, prozac, effexor-XR  PULMONARY A: Intubated for airway protection  P:   PRVC 8cc/kg Wean FiO2/Peep for sats > 94% VAP measures CXR prn  SBT daily Still quite sleepy, hopeful to extubate later today  CARDIOVASCULAR A:  Bradycardia Prolonged QTc Hypotension- ?related to sedation Hx HLD - troponins neg, TWI in anterolateral leads 7/17 P:  Telemetry Neo for SBP goal 120-140 TTE pending  Avoid QTc prolonging meds - zofran d/c, may need to d/c prozac Monitor QTc  RENAL A:   No acute issues P:   NS at 75 ml/hr Trend BMP / urinary output Replace electrolytes as indicated Avoid nephrotoxic agents, ensure adequate renal perfusion  GASTROINTESTINAL A:   GI ppx P:   PPI for SUP If not extubated today, start  TF  HEMATOLOGIC A:   DVT ppx P:  lovenox and SCDs CBC today and trend CBC  INFECTIOUS A:   No acute issues P:  Monitor fever curve/ Trend CBC  ENDOCRINE A:   No acute issues   P:  Monitor on BMET   FAMILY  - Updates: Husband updated at bedside.    - Inter-disciplinary family meet or Palliative Care meeting due by:  02/21/17  CCT 40 mins  Posey Boyer, AGACNP-BC [facility] Pgr: [phone] or if no answer [phone] 02/16/2017, 7:53 AM  Attending Note:  I have examined patient, reviewed labs, studies and notes. I have discussed the case with B Simpson, and I agree with the data and plans as amended above. 71 year old woman admitted with a left MCA stroke. She went to interventional radiology for mechanical thrombectomy and left ICA stenting. She is currently tolerating pressure support and is comfortable with good lung volumes. She is awake and interacts, follows commands. She moves her left upper  extremity with good strength, right upper ext less so. Her is regular without murmur area lungs are clear to auscultation bilaterally. Abdomen is soft, nontender, positive bowel sounds. Right groin site is benign. We will plan to extubate this am, push pulm hygiene. Assess for ability to take PO. BP goal is SBP < 180. Independent critical care time is 35 minutes.   Levy Pupa, MD, PhD 02/16/2017, 12:41 PM [facility] [phone] or if no answer [phone]

## 2017-02-16 NOTE — Progress Notes (Signed)
PT Cancellation Note  Patient Details Name: Kaitlin [name] MRN: [id number] DOB: Feb 23, 1946   Cancelled Treatment:    Reason Eval/Treat Not Completed: Patient not medically ready. Pt currently on bedrest. Please update activity orders when appropriate to initiate PT.    Marylynn Pearson 02/16/2017, 7:41 AM   Conni Slipper, PT, DPT Acute Rehabilitation Services Pager: [phone]

## 2017-02-16 NOTE — Evaluation (Signed)
Speech Language Pathology Evaluation Patient Details Name: Kaitlin [name] MRN: [id number] DOB: 03-18-1946 Today's Date: 02/16/2017 Time: 4098-1191 SLP Time Calculation (min) (ACUTE ONLY): 15 min  Problem List:  Patient Active Problem List   Diagnosis Date Noted  . Stroke (cerebrum) (HCC) 02/14/2017   Past Medical History:  Past Medical History:  Diagnosis Date  . High cholesterol   . Multiple sclerosis (HCC)    Past Surgical History:  Past Surgical History:  Procedure Laterality Date  . CORONARY ANGIOPLASTY WITH STENT PLACEMENT    . IR ANGIO INTRA EXTRACRAN SEL COM CAROTID INNOMINATE UNI R MOD SED  02/15/2017  . IR ANGIO VERTEBRAL SEL SUBCLAVIAN INNOMINATE UNI R MOD SED  02/15/2017  . IR INTRAVSC STENT CERV CAROTID W/O EMB-PROT MOD SED INC ANGIO  02/15/2017  . IR PERCUTANEOUS ART THROMBECTOMY/INFUSION INTRACRANIAL INC DIAG ANGIO  02/15/2017  . NECK SURGERY    . RADIOLOGY WITH ANESTHESIA N/A 02/14/2017   Procedure: RADIOLOGY WITH ANESTHESIA;  Surgeon: Radiologist, Medication, MD;  Location: [facility] OR;  Service: Radiology;  Laterality: N/A;  . WRIST SURGERY     HPI:  Patient's 71 year old female with past medical history significant for multiple sclerosis and high cholesterol admitted with stuttering symptoms, somewhat resolved and right hand weakness. MRI showed acute confluent infarct involving the left putamen, caudate body, and corona radiata, tiny cortical infarcts in the left MCA distribution, extensive chronic white matter disease, some combination of patient's multiple sclerosis and chronic microvascular ischemia. Pt underwent revascularization and left ICA angioplasty stent 7/17. RN reports intubated total of 30 hours.    Assessment / Plan / Recommendation Clinical Impression  Pt demonstrates characteristics primarily of a transcortical motor aphasia with dysfluent speech, comprehension for basic and moderate level information mostly intact and repetition preserved for shorter  sentences. The bedside Western Aphasia battery incomplete due to pt's lethargy and difficulty remaining alert. She appeared to have several neologisms and dysnomia in combination with dysarthria marked by decreased intensity. Motor apraxia present during command following. Pt is an excellent candidate for inpatient rehab.          SLP Assessment  SLP Recommendation/Assessment: Patient needs continued Speech Lanaguage Pathology Services SLP Visit Diagnosis: Dysphagia, unspecified (R13.10)    Follow Up Recommendations  Inpatient Rehab    Frequency and Duration min 2x/week  2 weeks      SLP Evaluation Cognition  Overall Cognitive Status: Impaired/Different from baseline Arousal/Alertness: Lethargic Orientation Level:  (Will assess further) Attention: Sustained Sustained Attention: Impaired Sustained Attention Impairment: Verbal basic Memory:  (TBA) Awareness: Impaired Awareness Impairment: Anticipatory impairment;Emergent impairment Problem Solving: Impaired Problem Solving Impairment: Functional basic Executive Function: Self Monitoring;Self Correcting Safety/Judgment: Impaired       Comprehension  Auditory Comprehension Overall Auditory Comprehension: Impaired Yes/No Questions: Impaired Basic Immediate Environment Questions: 75-100% accurate (80%) Commands: Impaired Two Step Basic Commands: 50-74% accurate Interfering Components: Attention Visual Recognition/Discrimination Discrimination: Not tested Reading Comprehension Reading Status:  (TBA)    Expression Expression Primary Mode of Expression: Verbal Verbal Expression Overall Verbal Expression: Impaired Initiation: Impaired Level of Generative/Spontaneous Verbalization: Phrase Repetition:  (5/6 accurate on repetition subtest bedside WAB) Naming: Impairment Responsive: Not tested Confrontation: Impaired Convergent: 25-49% accurate Divergent: Not tested Verbal Errors: Not aware of errors;Neologisms Pragmatics:  Impairment Impairments: Eye contact Interfering Components: Attention Written Expression Dominant Hand: Left Written Expression:  (TBA)   Oral / Motor  Oral Motor/Sensory Function Overall Oral Motor/Sensory Function:  (evidence of apraxia- will assess further) Motor Speech Overall Motor Speech: Impaired Respiration: Impaired Level  of Impairment: Sentence Phonation: Breathy;Low vocal intensity Resonance: Within functional limits Articulation: Within functional limitis Intelligibility: Intelligibility reduced Word: 75-100% accurate Phrase: 50-74% accurate Conversation: 25-49% accurate Motor Planning: Witnin functional limits   GO                    Kaitlin [name] 02/16/2017, 3:57 PM  Kaitlin [name] Kaitlin [name] M.Ed [employer] [phone]

## 2017-02-16 NOTE — Evaluation (Signed)
Clinical/Bedside Swallow Evaluation Patient Details  Name: Kaitlin [name] MRN: [id number] Date of Birth: 06/25/1946  Today's Date: 02/16/2017 Time: SLP Start Time (ACUTE ONLY): 1440 SLP Stop Time (ACUTE ONLY): 1455 SLP Time Calculation (min) (ACUTE ONLY): 15 min  Past Medical History:  Past Medical History:  Diagnosis Date  . High cholesterol   . Multiple sclerosis (HCC)    Past Surgical History:  Past Surgical History:  Procedure Laterality Date  . CORONARY ANGIOPLASTY WITH STENT PLACEMENT    . NECK SURGERY    . RADIOLOGY WITH ANESTHESIA N/A 02/14/2017   Procedure: RADIOLOGY WITH ANESTHESIA;  Surgeon: Radiologist, Medication, MD;  Location: [facility] OR;  Service: Radiology;  Laterality: N/A;  . WRIST SURGERY     HPI:  Patient's 71 year old female with past medical history significant for multiple sclerosis and high cholesterol admitted with stuttering symptoms, somewhat resolved and right hand weakness. MRI showed acute confluent infarct involving the left putamen, caudate body, and corona radiata, tiny cortical infarcts in the left MCA distribution, extensive chronic white matter disease, some combination of patient's multiple sclerosis and chronic microvascular ischemia. Pt underwent revascularization and left ICA angioplasty stent 7/17. RN reports intubated total of 30 hours.    Assessment / Plan / Recommendation Clinical Impression  Pt exhibits indications of decreased airway protection, pharyngeal residue and delayed swallow initiation is suspected. Cup sip thin water resulted in immediate and strong cough x 2 trials. No cough with puree however recommend remain NPO. Vocal quality is breathy with decreased intensity most likely due to intubation however she has a left infarct also increasing aspiration risk. MBS plan for tomorrow.  SLP Visit Diagnosis: Dysphagia, unspecified (R13.10)    Aspiration Risk  Moderate aspiration risk    Diet Recommendation NPO        Other  Recommendations  Oral Care Recommendations: Oral care QID   Follow up Recommendations Inpatient Rehab      Frequency and Duration            Prognosis        Swallow Study   General HPI: Patient's 71 year old female with past medical history significant for multiple sclerosis and high cholesterol admitted with stuttering symptoms, somewhat resolved and right hand weakness. MRI showed acute confluent infarct involving the left putamen, caudate body, and corona radiata, tiny cortical infarcts in the left MCA distribution, extensive chronic white matter disease, some combination of patient's multiple sclerosis and chronic microvascular ischemia. Pt underwent revascularization and left ICA angioplasty stent 7/17. RN reports intubated total of 30 hours.  Type of Study: Bedside Swallow Evaluation Previous Swallow Assessment:  (none) Diet Prior to this Study: NPO Temperature Spikes Noted: Yes Respiratory Status: Room air History of Recent Intubation: Yes Length of Intubations (days):  (approx 1.5 days) Date extubated: 02/16/17 (around 11) Behavior/Cognition: Cooperative;Pleasant mood;Lethargic/Drowsy Oral Cavity Assessment: Dry Oral Care Completed by SLP: Yes Oral Cavity - Dentition: Adequate natural dentition Vision:  (?) Self-Feeding Abilities: Able to feed self;Needs assist;Needs set up Patient Positioning: Upright in chair Baseline Vocal Quality: Breathy;Low vocal intensity Volitional Cough:  (moderately strong)    Oral/Motor/Sensory Function Overall Oral Motor/Sensory Function:  (apraxia and sleepy-difficulty to fully assess)   Ice Chips Ice chips: Not tested   Thin Liquid Thin Liquid: Impaired Presentation: Cup Oral Phase Impairments:  (none) Oral Phase Functional Implications:  (none) Pharyngeal  Phase Impairments: Suspected delayed Swallow;Decreased hyoid-laryngeal movement;Cough - Immediate    Nectar Thick Nectar Thick Liquid: Not tested   Honey Thick Honey Thick Liquid: Not tested  Puree  Puree: Impaired Pharyngeal Phase Impairments: Multiple swallows;Suspected delayed Swallow   Solid   GO   Solid: Not tested        Royce Macadamia 02/16/2017,3:28 PM  Breck Coons Lonell Face.Ed [employer] [phone]

## 2017-02-16 NOTE — Progress Notes (Signed)
Rehab Admissions Coordinator Note:  Patient was screened by Trish Mage for appropriateness for an Inpatient Acute Rehab Consult.   Will await PT/OT evaluations and recommendations to determine functional levels and most appropriate venue of care.  Trish Mage 02/16/2017, 4:22 PM  I can be reached at [phone].

## 2017-02-16 NOTE — Progress Notes (Signed)
eLink Physician-Brief Progress Note Patient Name: Kaitlin [name] DOB: 08/16/1945 MRN: [id number]   Date of Service  02/16/2017  HPI/Events of Note  Dilirium - Patient trying to get out of bed. Bedside nurse requests a bedside sitter and [facility] restraint.   eICU Interventions  Will order:  1. Bedside sitter. 2. P[place]y [place].     Intervention Category Major Interventions: Delirium, psychosis, severe agitation - evaluation and management  Arcadia Gorgas Eugene 02/16/2017, 3:42 PM

## 2017-02-16 NOTE — Progress Notes (Signed)
OT Cancellation Note  Patient Details Name: Kaitlin [name] MRN: [id number] DOB: 05-21-1946   Cancelled Treatment:    Reason Eval/Treat Not Completed: Patient not medically ready. Pt on bedrest. Please update activity orders when appropriate to begin OT. thanks  [facility], OT/L  [phone] 02/16/2017 02/16/2017, 7:06 AM

## 2017-02-16 NOTE — Progress Notes (Signed)
Referring Physician(s): DR Jerel Shepherd  Supervising Physician: Julieanne Cotton  Patient Status:  [facility] - In-pt  Chief Complaint:  CVA  Subjective:  L MCA revascularization and L ICA angioplasty/stent 7/17 am Extubated; following most commands Mouthing some words--- no speech   Allergies: Penicillins  Medications: Prior to Admission medications   Medication Sig Start Date End Date Taking? Authorizing Provider  aspirin EC 81 MG tablet Take 81 mg by mouth daily.   Yes [provider]  Calcium Citrate-Vitamin D (CALCIUM CITRATE + D PO) Take 1 tablet by mouth daily.   Yes [provider]  FLUoxetine (PROZAC) 10 MG capsule Take 10 mg by mouth daily.   Yes [provider]  gabapentin (NEURONTIN) 300 MG capsule Take 300 mg by mouth as needed. 11/19/16  Yes [provider]  Multiple Vitamins-Minerals (PRESERVISION AREDS PO) Take 1 tablet by mouth 2 (two) times daily.   Yes [provider]  omega-3 acid ethyl esters (LOVAZA) 1 g capsule Take 1 g by mouth 2 (two) times daily. 1200 mg   Yes [provider]  pravastatin (PRAVACHOL) 10 MG tablet Take 10 mg by mouth daily.    Yes [provider]  venlafaxine XR (EFFEXOR-XR) 75 MG 24 hr capsule Take 75 mg by mouth daily.   Yes [provider]  traMADol (ULTRAM) 50 MG tablet Take 1 tablet (50 mg total) by mouth every 6 (six) hours as needed for pain. Patient not taking: Reported on 02/14/2017 04/26/12   Ivonne Andrew, PA-C     Vital Signs: BP (!) 105/40 Comment: Titrating pressors prn; using artline as bp guide  Pulse (!) 47   Temp 99.3 F (37.4 C)   Resp 20   Ht 5\' 2"  (1.575 m)   Wt 124 lb (56.2 kg)   SpO2 99%   BMI 22.68 kg/m   Physical Exam  Abdominal: Soft.  Musculoskeletal: Normal range of motion.  Neurological: She is alert.  Mouthing words- no audible speech Moves all 4s to command Right is weaker  Skin: Skin is warm and dry.  Psychiatric: She has a  normal mood and affect.  Nursing note and vitals reviewed. Rt groin is clean and dry; NT no bleeding  no hematoma Rt foot 2+ pulses  Imaging: Ct Angio Head W Or Wo Contrast  Result Date: 02/14/2017 CLINICAL DATA:  Code stroke. 71 y/o F; slurred speech and right-sided weakness. EXAM: CT ANGIOGRAPHY HEAD AND NECK TECHNIQUE: Multidetector CT imaging of the head and neck was performed using the standard protocol during bolus administration of intravenous contrast. Multiplanar CT image reconstructions and MIPs were obtained to evaluate the vascular anatomy. Carotid stenosis measurements (when applicable) are obtained utilizing NASCET criteria, using the distal internal carotid diameter as the denominator. CONTRAST:  50 cc Isovue 370 COMPARISON:  04/26/2012 CT head FINDINGS: CT HEAD FINDINGS Brain: Focal loss of gray-white differentiation in the left insula (series 5, image 30) hypoattenuation within left posterior putamen. Stable background of moderate chronic microvascular ischemic changes the brain and mild brain parenchymal volume loss. No evidence for intracranial hemorrhage, hydrocephalus, or mass effect. Vascular: Left distal M1 hyperdensity (series 5, image 28). Skull: Normal. Negative for fracture or focal lesion. Sinuses/Orbits: No acute finding. Other: None. ASPECTS [facility] Stroke Program Early CT Score) - Ganglionic level infarction (caudate, lentiform nuclei, internal capsule, insula, M1-M3 cortex): 5 - Supraganglionic infarction (M4-M6 cortex): 3 Total score (0-10 with 10 being normal): 8 CTA NECK FINDINGS Aortic arch: Standard branching. Imaged portion  shows no evidence of aneurysm or dissection. No significant stenosis of the major arch vessel origins. Moderate calcific atherosclerosis. Right carotid system: No evidence of dissection, stenosis (50% or greater) or occlusion. Mild calcific atherosclerosis of the bifurcation. Left carotid system: Complete occlusion of the left common and internal  carotid artery is from the aortic arch. Patent left external carotid artery, likely via collateralization. Vertebral arteries: Right dominant. No evidence of dissection, stenosis (50% or greater) or occlusion. Skeleton: C4 through C7 anterior fusion and discectomy. Hardware appears intact without apparent hardware related complication. Multilevel prominent facet arthrosis. No high-grade bony canal stenosis. Other neck: Negative. Upper chest: Moderate centrilobular emphysema and mild biapical pleuroparenchymal scarring. Review of the MIP images confirms the above findings CTA HEAD FINDINGS Anterior circulation: Occlusion of the internal carotid artery to the distal cavernous segment. Paraclinoid and terminal left ICA segments are patent as is the very proximal left M1 likely due to blood flow via a diminutive left posterior communicating artery. Left mid and distal M1 occlusion. Poor left MCA distribution collateralization. Left A1 occlusion. Patent bilateral A2 and symmetric ACA circulation. The left distal ACA is likely perfused via the anterior communicating artery. Patent right MCA. Posterior circulation: No significant stenosis, proximal occlusion, aneurysm, or vascular malformation. The segments of mild stenosis in the left V4 segment. Venous sinuses: As permitted by contrast timing, patent. Anatomic variants: Bilateral posterior and anterior communicating artery are present. Delayed phase: No abnormal intracranial enhancement. Review of the MIP images confirms the above findings IMPRESSION: CT head: 1. Focal hypodensity in left insula and left posterior putamen compatible with infarction. 2. Hyperdensity and distal left M1. 3. ASPECTS is 8 CTA neck: 1. Occlusion of the left common and internal carotid artery from the arch to the distal cavernous segment. 2. Patent right common carotid system and vertebral arteries without dissection, aneurysm, or significant stenosis. 3. Moderate calcific atherosclerosis of  the aortic arch 4. Centrilobular emphysema of lung apices. CTA head: 1. Patent paraclinoid and terminal segments of left internal carotid artery and minimal flow in proximal left M1 likely retrograde via a tiny left posterior communicating artery. 2. Occlusion of mid and distal left M1 with poor left MCA collateralization. 3. Left A1 occlusion. Bilateral A2 are patent with the left likely perfused via the anterior communicating artery. Symmetric distal ACA circulation. 4. Patent right MCA and posterior circulation. 5. No intracranial aneurysm identified. These results were called by telephone at the time of interpretation on 02/14/2017 at 8:56 pm to Dr. Doug Sou ; [facility] , who verbally acknowledged these results. Electronically Signed   By: Mitzi Hansen M.D.   On: 02/14/2017 21:08   Ct Head Wo Contrast  Addendum Date: 02/15/2017   ADDENDUM REPORT: 02/15/2017 03:18 ADDENDUM: Acute findings discussed with and reconfirmed by Dr.Lindzen, Neurology on 02/15/2017 at 3:15 am. Electronically Signed   By: Awilda Metro M.D.   On: 02/15/2017 03:18   Result Date: 02/15/2017 CLINICAL DATA:  Follow-up code stroke. Status post revascularization of occluded LEFT middle cerebral artery and angioplasty of occluded proximal LEFT internal carotid artery. EXAM: CT HEAD WITHOUT CONTRAST TECHNIQUE: Contiguous axial images were obtained from the base of the skull through the vertex without intravenous contrast. COMPARISON:  CT HEAD February 14, 2017 FINDINGS: BRAIN: Abnormal contiguous density LEFT basal ganglia to anteromedial temporal lobe. No mass effect or midline shift. Ventricles and sulci are overall normal for patient's age. Confluent supratentorial white matter hypodensities. Relative preservation of gray-white matter differentiation in areas not obscured  by new density. No abnormal extra-axial fluid collections. Basal cisterns are patent. VASCULAR: Moderate calcific atherosclerosis of the carotid  siphons. SKULL: No skull fracture. Severe temporomandibular osteoarthrosis. No significant scalp soft tissue swelling. SINUSES/ORBITS: The mastoid air-cells and included paranasal sinuses are well-aerated.The included ocular globes and orbital contents are non-suspicious. Status post bilateral ocular lens implants. OTHER: Life-support lines in place. IMPRESSION: 1. Contrast staining versus blood products LEFT basal ganglia to LEFT temporal lobe. Recommend 6-12 hour follow-up CT. 2. Moderate to severe chronic small vessel ischemic disease. These results will be called to the ordering clinician or representative by the Radiologist Assistant, and communication documented in the [employer] Dashboard. Electronically Signed: By: Awilda Metro M.D. On: 02/15/2017 03:01   Ct Angio Neck W Or Wo Contrast  Result Date: 02/14/2017 CLINICAL DATA:  Code stroke. 71 y/o F; slurred speech and right-sided weakness. EXAM: CT ANGIOGRAPHY HEAD AND NECK TECHNIQUE: Multidetector CT imaging of the head and neck was performed using the standard protocol during bolus administration of intravenous contrast. Multiplanar CT image reconstructions and MIPs were obtained to evaluate the vascular anatomy. Carotid stenosis measurements (when applicable) are obtained utilizing NASCET criteria, using the distal internal carotid diameter as the denominator. CONTRAST:  50 cc Isovue 370 COMPARISON:  04/26/2012 CT head FINDINGS: CT HEAD FINDINGS Brain: Focal loss of gray-white differentiation in the left insula (series 5, image 30) hypoattenuation within left posterior putamen. Stable background of moderate chronic microvascular ischemic changes the brain and mild brain parenchymal volume loss. No evidence for intracranial hemorrhage, hydrocephalus, or mass effect. Vascular: Left distal M1 hyperdensity (series 5, image 28). Skull: Normal. Negative for fracture or focal lesion. Sinuses/Orbits: No acute finding. Other: None. ASPECTS [facility] Stroke  Program Early CT Score) - Ganglionic level infarction (caudate, lentiform nuclei, internal capsule, insula, M1-M3 cortex): 5 - Supraganglionic infarction (M4-M6 cortex): 3 Total score (0-10 with 10 being normal): 8 CTA NECK FINDINGS Aortic arch: Standard branching. Imaged portion shows no evidence of aneurysm or dissection. No significant stenosis of the major arch vessel origins. Moderate calcific atherosclerosis. Right carotid system: No evidence of dissection, stenosis (50% or greater) or occlusion. Mild calcific atherosclerosis of the bifurcation. 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 Patent left external carotid artery, likely via collateralization. Vertebral arteries: Right dominant. No evidence of dissection, stenosis (50% or greater) or occlusion. Skeleton: C4 through C7 anterior fusion and discectomy. Hardware appears intact without apparent hardware related complication. Multilevel prominent facet arthrosis. No high-grade bony canal stenosis. Other neck: Negative. Upper chest: Moderate centrilobular emphysema and mild biapical pleuroparenchymal scarring. Review of the MIP images confirms the above findings CTA HEAD FINDINGS Anterior circulation: Occlusion of the internal carotid artery to the distal cavernous segment. Paraclinoid and terminal left ICA segments are patent as is the very proximal left M1 likely due to blood flow via a diminutive left posterior communicating artery. Left mid and distal M1 occlusion. Poor left MCA distribution collateralization. Left A1 occlusion. Patent bilateral A2 and symmetric ACA circulation. The left distal ACA is likely perfused via the anterior communicating artery. Patent right MCA. Posterior circulation: No significant stenosis, proximal occlusion, aneurysm, or vascular malformation. The segments of mild stenosis in the left V4 segment. Venous sinuses: As permitted by contrast timing, patent.  Anatomic variants: Bilateral posterior and anterior communicating artery are present. Delayed phase: No abnormal intracranial enhancement. Review of the MIP images confirms the above findings IMPRESSION: CT head: 1. Focal hypodensity in  left insula and left posterior putamen compatible with infarction. 2. Hyperdensity and distal left M1. 3. ASPECTS is 8 CTA neck: 1. Occlusion of the left common and internal carotid artery from the arch to the distal cavernous segment. 2. Patent right common carotid system and vertebral arteries without dissection, aneurysm, or significant stenosis. 3. Moderate calcific atherosclerosis of the aortic arch 4. Centrilobular emphysema of lung apices. CTA head: 1. Patent paraclinoid and terminal segments of left internal carotid artery and minimal flow in proximal left M1 likely retrograde via a tiny left posterior communicating artery. 2. Occlusion of mid and distal left M1 with poor left MCA collateralization. 3. Left A1 occlusion. Bilateral A2 are patent with the left likely perfused via the anterior communicating artery. Symmetric distal ACA circulation. 4. Patent right MCA and posterior circulation. 5. No intracranial aneurysm identified. These results were called by telephone at the time of interpretation on 02/14/2017 at 8:56 pm to Dr. Doug Sou ; [facility] , who verbally acknowledged these results. Electronically Signed   By: Mitzi Hansen M.D.   On: 02/14/2017 21:08   Mr Brain Wo Contrast  Result Date: 02/15/2017 CLINICAL DATA:  Slurred speech and right-sided weakness EXAM: MRI HEAD WITHOUT CONTRAST MRA HEAD WITHOUT CONTRAST TECHNIQUE: Multiplanar, multiecho pulse sequences of the brain and surrounding structures were obtained without intravenous contrast. Angiographic images of the head were obtained using MRA technique without contrast. COMPARISON:  Head CT and CTA from earlier today FINDINGS: MRI HEAD FINDINGS Brain: Restricted diffusion in the left  putamen, caudate body, and intervening corona radiata. At least 2 punctate cortical infarcts along the posterior left frontal convexity. No hemorrhage by MRI. No hydrocephalus or masslike findings. Extensive FLAIR hyperintensity in the cerebral white matter. Milder changes are seen in the pons. Vascular: Vascular findings described below. Skull and upper cervical spine: Negative for marrow lesion Sinuses/Orbits: Bilateral cataract resection. MRA HEAD FINDINGS Recannulized left ICA which is smooth and widely patent. Mild vessel irregularity of the left M1 segment, but no flow limiting stenosis. There is a moderate stenosis involving the left inferior division M2 branch. Left MCA branches are less robustly opacified. Aplastic left A1 segment. No suspected superimposed acute A1 occlusion based on prior CTA. Right MCA branches are widely patent. Mildly dominant right vertebral artery. Mid left V4 segment stenosis, at least moderate. Basilar artery is smooth and widely patent. Small posterior communicating arteries are present. Both PCAs have symmetric robust flow. Incidental fenestration at the vertebrobasilar junction. IMPRESSION: 1. Acute confluent infarct involving the left putamen, caudate body, and corona radiata. 2 tiny cortical infarcts in the left MCA distribution. 2. Extensive chronic white matter disease, some combination of patient's multiple sclerosis and chronic microvascular ischemia. 3. Continued good patency of recently re- cannulized left ICA and M1 segment. 4. Mild left M1 segment irregularity. Moderate left M2 inferior division stenosis. 5. Moderate to advanced stenosis of the left V4 segment. Electronically Signed   By: Marnee Spring M.D.   On: 02/15/2017 14:45   Ct Cerebral Perfusion W Contrast  Result Date: 02/14/2017 CLINICAL DATA:  71 y/o  F; CT perfusion due to stroke. EXAM: CT PERFUSION BRAIN TECHNIQUE: Multiphase CT imaging of the brain was performed following IV bolus contrast  injection. Subsequent parametric perfusion maps were calculated using RAPID software. CONTRAST:  50 cc Isovue 370 COMPARISON:  CT angiogram head and neck dated 02/14/2017 FINDINGS: CT Brain Perfusion Findings: CBF (<30%) Volume: 0mL Perfusion (Tmax>6.0s) volume: 33mL Mismatch Volume: 33mL Infarction Location:Left MCA distribution IMPRESSION:  1. Left MCA distribution perfusion anomaly. Ischemic penumbra of 33 cc calculated by mismatch volume. 2. 0 cc infarct core (CBF <30%) by perfusion. This does not included the infarct core volume of the ASPECT 8 infarct on non contrast CT of the head involving the part of the left insula and putamen as visible infarct on noncontrast CT can be pseudo normalized on perfusion. These results were called by telephone at the time of interpretation on 02/14/2017 at 10:01 pm to Dr. Caryl Pina , who verbally acknowledged these results. Electronically Signed   By: Mitzi Hansen M.D.   On: 02/14/2017 22:01   Dg Chest Port 1 View  Result Date: 02/16/2017 CLINICAL DATA:  Intubation . EXAM: PORTABLE CHEST 1 VIEW COMPARISON:  Prior exam same date.  Prior exam 02/15/2017. FINDINGS: Endotracheal tube and NG tube in stable position. Heart size stable. Mild basilar subsegmental atelectasis. No focal alveolar infiltrate. No pleural effusion or pneumothorax . Cervicothoracic spine fusion . IMPRESSION: 1. Lines and tubes in stable position. 2. Mild bibasilar subsegmental atelectasis.  No focal infiltrate. Electronically Signed   By: Maisie Fus  Register   On: 02/16/2017 09:38   Portable Chest Xray  Result Date: 02/16/2017 CLINICAL DATA:  Stroke. EXAM: PORTABLE CHEST 1 VIEW COMPARISON:  02/15/2017 FINDINGS: Endotracheal tube, NG tube in stable position. Heart size stable. Interim improvement of left base subsegmental atelectasis. No focal infiltrate. No pleural effusion or pneumothorax. Prior cervicothoracic spine fusion . IMPRESSION: 1. Lines and tubes in stable position. 2. Interim  improved aeration left lung base. No acute cardiopulmonary disease identified. Electronically Signed   By: Maisie Fus  Register   On: 02/16/2017 08:11   Portable Chest Xray  Result Date: 02/15/2017 CLINICAL DATA:  Acute hypoxemic respiratory failure. Endotracheal tube placement. EXAM: PORTABLE CHEST 1 VIEW COMPARISON:  04/26/2012 CXR FINDINGS: The tip of an endotracheal tube is 5.1 cm above the carina in satisfactory position. A gastric tube is seen extending below the left hemidiaphragm with side port below the GE junction. Heart is top-normal in size with aortic atherosclerosis. Streaky atelectasis at the left lung base medially. No overt pulmonary edema. No pneumonic consolidation. ACDF of the lower cervical spine. IMPRESSION: 1. ACDF of the lower cervical spine. 2. Satisfactory support line and tube positions. 3. Atelectasis and/or scarring at the left lung base. 4. Aortic atherosclerosis. Electronically Signed   By: Tollie Eth M.D.   On: 02/15/2017 03:16   Mr Maxine Glenn Head/brain ZO Cm  Result Date: 02/15/2017 CLINICAL DATA:  Slurred speech and right-sided weakness EXAM: MRI HEAD WITHOUT CONTRAST MRA HEAD WITHOUT CONTRAST TECHNIQUE: Multiplanar, multiecho pulse sequences of the brain and surrounding structures were obtained without intravenous contrast. Angiographic images of the head were obtained using MRA technique without contrast. COMPARISON:  Head CT and CTA from earlier today FINDINGS: MRI HEAD FINDINGS Brain: Restricted diffusion in the left putamen, caudate body, and intervening corona radiata. At least 2 punctate cortical infarcts along the posterior left frontal convexity. No hemorrhage by MRI. No hydrocephalus or masslike findings. Extensive FLAIR hyperintensity in the cerebral white matter. Milder changes are seen in the pons. Vascular: Vascular findings described below. Skull and upper cervical spine: Negative for marrow lesion Sinuses/Orbits: Bilateral cataract resection. MRA HEAD FINDINGS  Recannulized left ICA which is smooth and widely patent. Mild vessel irregularity of the left M1 segment, but no flow limiting stenosis. There is a moderate stenosis involving the left inferior division M2 branch. Left MCA branches are less robustly opacified. Aplastic left A1 segment. No suspected  superimposed acute A1 occlusion based on prior CTA. Right MCA branches are widely patent. Mildly dominant right vertebral artery. Mid left V4 segment stenosis, at least moderate. Basilar artery is smooth and widely patent. Small posterior communicating arteries are present. Both PCAs have symmetric robust flow. Incidental fenestration at the vertebrobasilar junction. IMPRESSION: 1. Acute confluent infarct involving the left putamen, caudate body, and corona radiata. 2 tiny cortical infarcts in the left MCA distribution. 2. Extensive chronic white matter disease, some combination of patient's multiple sclerosis and chronic microvascular ischemia. 3. Continued good patency of recently re- cannulized left ICA and M1 segment. 4. Mild left M1 segment irregularity. Moderate left M2 inferior division stenosis. 5. Moderate to advanced stenosis of the left V4 segment. Electronically Signed   By: Marnee Spring M.D.   On: 02/15/2017 14:45   Ct Head Code Stroke W/o Cm  Result Date: 02/14/2017 CLINICAL DATA:  Code stroke. 71 y/o F; slurred speech and right-sided weakness. EXAM: CT ANGIOGRAPHY HEAD AND NECK TECHNIQUE: Multidetector CT imaging of the head and neck was performed using the standard protocol during bolus administration of intravenous contrast. Multiplanar CT image reconstructions and MIPs were obtained to evaluate the vascular anatomy. Carotid stenosis measurements (when applicable) are obtained utilizing NASCET criteria, using the distal internal carotid diameter as the denominator. CONTRAST:  50 cc Isovue 370 COMPARISON:  04/26/2012 CT head FINDINGS: CT HEAD FINDINGS Brain: Focal loss of gray-white differentiation  in the left insula (series 5, image 30) hypoattenuation within left posterior putamen. Stable background of moderate chronic microvascular ischemic changes the brain and mild brain parenchymal volume loss. No evidence for intracranial hemorrhage, hydrocephalus, or mass effect. Vascular: Left distal M1 hyperdensity (series 5, image 28). Skull: Normal. Negative for fracture or focal lesion. Sinuses/Orbits: No acute finding. Other: None. ASPECTS [facility] Stroke Program Early CT Score) - Ganglionic level infarction (caudate, lentiform nuclei, internal capsule, insula, M1-M3 cortex): 5 - Supraganglionic infarction (M4-M6 cortex): 3 Total score (0-10 with 10 being normal): 8 CTA NECK FINDINGS Aortic arch: Standard branching. Imaged portion shows no evidence of aneurysm or dissection. No significant stenosis of the major arch vessel origins. Moderate calcific atherosclerosis. Right carotid system: No evidence of dissection, stenosis (50% or greater) or occlusion. Mild calcific atherosclerosis of the bifurcation. 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 Patent left external carotid artery, likely via collateralization. Vertebral arteries: Right dominant. No evidence of dissection, stenosis (50% or greater) or occlusion. Skeleton: C4 through C7 anterior fusion and discectomy. Hardware appears intact without apparent hardware related complication. Multilevel prominent facet arthrosis. No high-grade bony canal stenosis. Other neck: Negative. Upper chest: Moderate centrilobular emphysema and mild biapical pleuroparenchymal scarring. Review of the MIP images confirms the above findings CTA HEAD FINDINGS Anterior circulation: Occlusion of the internal carotid artery to the distal cavernous segment. Paraclinoid and terminal left ICA segments are patent as is the very proximal left M1 likely due to blood flow via a diminutive left posterior communicating artery.  Left mid and distal M1 occlusion. Poor left MCA distribution collateralization. Left A1 occlusion. Patent bilateral A2 and symmetric ACA circulation. The left distal ACA is likely perfused via the anterior communicating artery. Patent right MCA. Posterior circulation: No significant stenosis, proximal occlusion, aneurysm, or vascular malformation. The segments of mild stenosis in the left V4 segment. Venous sinuses: As permitted by contrast timing, patent. Anatomic variants: Bilateral posterior and anterior communicating artery are present. Delayed phase: No abnormal intracranial enhancement.  Review of the MIP images confirms the above findings IMPRESSION: CT head: 1. Focal hypodensity in left insula and left posterior putamen compatible with infarction. 2. Hyperdensity and distal left M1. 3. ASPECTS is 8 CTA neck: 1. Occlusion of the left common and internal carotid artery from the arch to the distal cavernous segment. 2. Patent right common carotid system and vertebral arteries without dissection, aneurysm, or significant stenosis. 3. Moderate calcific atherosclerosis of the aortic arch 4. Centrilobular emphysema of lung apices. CTA head: 1. Patent paraclinoid and terminal segments of left internal carotid artery and minimal flow in proximal left M1 likely retrograde via a tiny left posterior communicating artery. 2. Occlusion of mid and distal left M1 with poor left MCA collateralization. 3. Left A1 occlusion. Bilateral A2 are patent with the left likely perfused via the anterior communicating artery. Symmetric distal ACA circulation. 4. Patent right MCA and posterior circulation. 5. No intracranial aneurysm identified. These results were called by telephone at the time of interpretation on 02/14/2017 at 8:56 pm to Dr. Doug Sou ; [facility] , who verbally acknowledged these results. Electronically Signed   By: Mitzi Hansen M.D.   On: 02/14/2017 21:08    Labs:  CBC:  Recent Labs   02/14/17 2030 02/14/17 2034 02/15/17 0558 02/16/17 0830  WBC 7.6  --  10.6* 10.0  HGB 12.2 12.6 9.8* 9.2*  HCT 37.3 37.0 29.9* 29.2*  PLT 203  --  218 162    COAGS:  Recent Labs  02/14/17 2030  INR 0.94  APTT 25    BMP:  Recent Labs  02/14/17 2030 02/14/17 2034 02/16/17 0830  NA 138 139 141  K 4.0 4.0 4.3  CL 107 107 115*  CO2 20*  --  21*  GLUCOSE 96 94 114*  BUN 23* 26* 25*  CALCIUM 9.1  --  8.2*  CREATININE 0.84 1.00 0.65  GFRNONAA >60  --  >60  GFRAA >60  --  >60    LIVER FUNCTION TESTS:  Recent Labs  02/14/17 2030  BILITOT 0.4  AST 25  ALT 20  ALKPHOS 58  PROT 6.7  ALBUMIN 4.0    Assessment and Plan:  CVA  L MCA revasc and L ICA angioplasty/stent 7/17 Better daily Weaker on Rt Will follow Will need follow up with Dr Corliss Skains as OP IR will arrange and call pt with time and date  Electronically Signed: Otoniel Myhand A, PA-C 02/16/2017, 12:07 PM   I spent a total of 15 Minutes at the the patient's bedside AND on the patient's hospital floor or unit, greater than 50% of which was counseling/coordinating care for CVA; L MCA; L ICA pta/stent

## 2017-02-16 NOTE — Progress Notes (Signed)
  Echocardiogram 2D Echocardiogram has been performed.  Kaitlin [name] 02/16/2017, 12:29 PM

## 2017-02-16 NOTE — Progress Notes (Signed)
After discussion with SLP, Dr Corliss Skains and Dr Roda Shutters, will give Brillinta po crushed in puree tonight, re-evaluate swallow with MBS in AM.

## 2017-02-16 NOTE — Procedures (Signed)
Extubation Procedure Note  Patient Details:   Name: Kaitlin [name] DOB: [birth year] MRN: [id number]   Airway Documentation:     Evaluation  O2 sats: stable throughout Complications: No apparent complications Patient did tolerate procedure well. Bilateral Breath Sounds: Clear, Diminished, Rhonchi   Yes  Placed on 4l/min [place] Incentive spirometer instructed, will need assistance 529ml's  Newt Lukes 02/16/2017, 11:01 AM

## 2017-02-16 NOTE — Progress Notes (Signed)
STROKE TEAM PROGRESS NOTE   SUBJECTIVE (INTERVAL HISTORY) Her husband is at the bedside.  The patient is still intubated but off sedated.  Eyes widely open, follows commands. Plan to extubate today. patient blood pressure much improved, currently off Neo.     OBJECTIVE Temp:  [99.1 F (37.3 C)-100.4 F (38 C)] 100 F (37.8 C) (07/18 1200) Pulse Rate:  [30-86] 86 (07/18 1712) Cardiac Rhythm: Sinus bradycardia (07/18 0400) Resp:  [11-21] 14 (07/18 1712) BP: (76-149)/(34-66) 137/55 (07/18 1712) SpO2:  [88 %-100 %] 92 % (07/18 1712) Arterial Line BP: (66-154)/(36-137) 88/82 (07/18 1200) FiO2 (%):  [40 %] 40 % (07/18 0832)  CBC:  Recent Labs Lab 02/14/17 2030  02/15/17 0558 02/16/17 0830  WBC 7.6  --  10.6* 10.0  NEUTROABS 4.4  --  8.9*  --   HGB 12.2  < > 9.8* 9.2*  HCT 37.3  < > 29.9* 29.2*  MCV 96.6  --  97.1 100.3*  PLT 203  --  218 162  < > = values in this interval not displayed.  Basic Metabolic Panel:   Recent Labs Lab 02/14/17 2030 02/14/17 2034 02/16/17 0830  NA 138 139 141  K 4.0 4.0 4.3  CL 107 107 115*  CO2 20*  --  21*  GLUCOSE 96 94 114*  BUN 23* 26* 25*  CREATININE 0.84 1.00 0.65  CALCIUM 9.1  --  8.2*  MG  --   --  2.1    Lipid Panel:     Component Value Date/Time   CHOL 173 02/15/2017 0420   TRIG 145 02/15/2017 0420   HDL 70 02/15/2017 0420   CHOLHDL 2.5 02/15/2017 0420   VLDL 29 02/15/2017 0420   LDLCALC 74 02/15/2017 0420   HgbA1c:  Lab Results  Component Value Date   HGBA1C 5.2 02/15/2017   Urine Drug Screen:     Component Value Date/Time   LABOPIA NONE DETECTED 02/14/2017 2124   COCAINSCRNUR NONE DETECTED 02/14/2017 2124   LABBENZ NONE DETECTED 02/14/2017 2124   AMPHETMU NONE DETECTED 02/14/2017 2124   THCU NONE DETECTED 02/14/2017 2124   LABBARB NONE DETECTED 02/14/2017 2124    Alcohol Level     Component Value Date/Time   ETH 152 (H) 02/14/2017 2030    IMAGING I have personally reviewed the radiological images below  and agree with the radiology interpretations.  Ct Angio Head/Neck W and Wo Contrast 02/14/2017 IMPRESSION: CT head: 1. Focal hypodensity in left insula and left posterior putamen compatible with infarction. 2. Hyperdensity and distal left M1. 3. ASPECTS is 8 CTA neck: 1. Occlusion of the left common and internal carotid artery from the arch to the distal cavernous segment. 2. Patent right common carotid system and vertebral arteries without dissection, aneurysm, or significant stenosis. 3. Moderate calcific atherosclerosis of the aortic arch 4. Centrilobular emphysema of lung apices. CTA head: 1. Patent paraclinoid and terminal segments of left internal carotid artery and minimal flow in proximal left M1 likely retrograde via a tiny left posterior communicating artery. 2. Occlusion of mid and distal left M1 with poor left MCA collateralization. 3. Left A1 occlusion. Bilateral A2 are patent with the left likely perfused via the anterior communicating artery. Symmetric distal ACA circulation. 4. Patent right MCA and posterior circulation. 5. No intracranial aneurysm identified.   Ct Head Wo Contrast 02/15/2017 IMPRESSION: 1. Contrast staining versus blood products LEFT basal ganglia to LEFT temporal lobe. Recommend 6-12 hour follow-up CT. 2. Moderate to severe chronic small vessel  ischemic disease. T  Mr Brain 41 Contrast Mr Maxine Glenn Head/brain Wo Cm 02/15/2017 IMPRESSION: 1. Acute confluent infarct involving the left putamen, caudate body, and corona radiata. 2 tiny cortical infarcts in the left MCA distribution. 2. Extensive chronic white matter disease, some combination of patient's multiple sclerosis and chronic microvascular ischemia. 3. Continued good patency of recently re- cannulized left ICA and M1 segment. 4. Mild left M1 segment irregularity. Moderate left M2 inferior division stenosis. 5. Moderate to advanced stenosis of the left V4 segment.   Ct Cerebral Perfusion W Contrast 02/14/2017 IMPRESSION:  1. Left MCA distribution perfusion anomaly. Ischemic penumbra of 33 cc calculated by mismatch volume. 2. 0 cc infarct core (CBF <30%) by perfusion. This does not included the infarct core volume of the ASPECT 8 infarct on non contrast CT of the head involving the part of the left insula and putamen as visible infarct on noncontrast CT can be pseudo normalized on perfusion.   Portable Chest Xray 02/15/2017 IMPRESSION: 1. ACDF of the lower cervical spine. 2. Satisfactory support line and tube positions. 3. Atelectasis and/or scarring at the left lung base. 4. Aortic atherosclerosis.  Ct Head Code Stroke W/o Cm 02/14/2017 IMPRESSION: CT head: 1. Focal hypodensity in left insula and left posterior putamen compatible with infarction. 2. Hyperdensity and distal left M1. 3. ASPECTS is 8 CTA neck: 1. Occlusion of the left common and internal carotid artery from the arch to the distal cavernous segment. 2. Patent right common carotid system and vertebral arteries without dissection, aneurysm, or significant stenosis. 3. Moderate calcific atherosclerosis of the aortic arch 4. Centrilobular emphysema of lung apices. CTA head: 1. Patent paraclinoid and terminal segments of left internal carotid artery and minimal flow in proximal left M1 likely retrograde via a tiny left posterior communicating artery. 2. Occlusion of mid and distal left M1 with poor left MCA collateralization. 3. Left A1 occlusion. Bilateral A2 are patent with the left likely perfused via the anterior communicating artery. Symmetric distal ACA circulation. 4. Patent right MCA and posterior circulation. 5. No intracranial aneurysm identified.    TTE - Left ventricle: The cavity size was normal. There was moderate   concentric hypertrophy. Systolic function was vigorous. The   estimated ejection fraction was in the range of 65% to 70%. Wall   motion was normal; there were no regional wall motion   abnormalities. Doppler parameters are consistent with  abnormal   left ventricular relaxation (grade 1 diastolic dysfunction).   Doppler parameters are consistent with elevated ventricular   end-diastolic filling pressure. - Aortic valve: Trileaflet; mildly thickened, mildly calcified   leaflets. There was mild stenosis. Mean gradient (S): 11 mm Hg.   Peak gradient (S): 17 mm Hg. Valve area (VTI): 1.45 cm^2. Valve   area (Vmax): 1.73 cm^2. Valve area (Vmean): 1.44 cm^2. - Mitral valve: Calcified annulus. Mildly thickened leaflets .   There was trivial regurgitation. - Left atrium: The atrium was normal in size. - Right ventricle: The cavity size was normal. Wall thickness was   normal. Systolic function was normal. - Right atrium: The atrium was normal in size. - Tricuspid valve: There was trivial regurgitation. - Pulmonary arteries: Systolic pressure was within the normal   range. - Inferior vena cava: The vessel was normal in size. - Pericardium, extracardiac: There was no pericardial effusion. Impressions: - No cardiac source of emboli was indentified.  LE venous doppler pending   PHYSICAL EXAM  Temp:  [99.1 F (37.3 C)-100.4 F (38 C)] 100  F (37.8 C) (07/18 1200) Pulse Rate:  [30-86] 86 (07/18 1712) Resp:  [11-21] 14 (07/18 1712) BP: (76-149)/(34-66) 137/55 (07/18 1712) SpO2:  [88 %-100 %] 92 % (07/18 1712) Arterial Line BP: (66-154)/(36-137) 88/82 (07/18 1200) FiO2 (%):  [40 %] 40 % (07/18 0832)  General - Well nourished, well developed, intubated off sedation.  Ophthalmologic - Fundi not visualized due to noncooperation.  Cardiovascular - Regular rate and rhythm.  Neuro - intubated off sedation. Eyes open, follows simple commands centrally and peripherally. Left gaze preference, but able to cross midline. Blinking to visual threat bilaterally. PERRL. Facial symmetry not able to assess due to ET tube. RUE 0/5, RLE 2/5 on pain stimulation. LUE at least 4/5, LLE at least 3/5. DTR 1+ and right positive babinski. Sensation,  coordination and gait not tested.   ASSESSMENT/PLAN [name] is a 71 y.o. female with a  20 year history of MS, hyperlipidemia, and CAD with stent in 2000, who presented as a Code Stroke for right sided weakness and garbled speech. She did not receive IV t-PA due to arriving outside of the treatment window.   Stroke:  Acute MCA infarct involving the left putamen, caudate body, and corona radiata, as well as tiny cortical infarcts, due to left CCA and MCA occlusion, s/p mechanical thrombectomy and left ICA stenting embolic pattern, source unclear  Resultant  Intubated, right hemiplegia  CT head: Contrast staining versus blood products LEFT basal ganglia to LEFT temporal lobe.   CTA head and neck: left CCA origin occlusion, A1 and distal M1 occlusion  MRI head: Acute confluent infarct involving the left putamen, caudate body, and corona radiata, as well as tiny cortical infarcts in the left MCA distribution.    MRA head: Mild left M1 segment irregularity, moderate left M2 inferior division stenosis.   2D Echo  EF 65-70%  LE venous doppler pending  Consider TEE and loop recorder once patient stabilized  LDL 74  HgbA1c 5.2  lovenox for VTE prophylaxis Diet NPO time specified Except for: Ice Chips  aspirin 81 mg daily prior to admission, now on aspirin 325 mg daily and clopidogrel 75 mg daily. Due to high P2Y12 level, switch to aspirin 81 and brilinta 90 mg bid.  Patient counseled to be compliant with her antithrombotic medications  Ongoing aggressive stroke risk factor management  Therapy recommendations:  pending  Disposition:  Pending  Left CCA occlusion s/p thrombectomy  Etiology unclear  Concerning for cardiac thrombus  S/p left ICA stent  TEE and loop after stabilization  Hypotension  Improved  Off phenylephrine gtt Long-term BP goal normotensive  Hyperlipidemia  Home meds: none  LDL 74, goal < 70  Add atorvastatin 40mg  PO daily  Continue  statin at discharge  Other Stroke Risk Factors  Advanced age  ETOH use, advised to drink no more than 1 drink(s) a day  Coronary artery disease  Other Active Problems  Sinus bradycardia - resolved  Multiple sclerosis: follows neurology in [place], was on Betaseron, no flare up, took off 11/2015 due to elevated LFT  Hospital day # 2  This patient is critically ill due to CCA occlusion, MCA occlusion, s/p mechanical thrombectomy and ICA stent and at significant risk of neurological worsening, death form recurrent stroke, hemorrhagic conversion, seizure, cerebral edema. This patient's care requires constant monitoring of vital signs, hemodynamics, respiratory and cardiac monitoring, review of multiple databases, neurological assessment, discussion with family, other specialists and medical decision making of high complexity. I spent 35 minutes  of neurocritical care time in the care of this patient. I had long discussion with husband at bedside, updated pt current condition, treatment plan and potential prognosis. He expressed understanding and appreciation.   Marvel Plan, MD PhD Stroke Neurology 02/16/2017 5:47 PM   To contact Stroke Continuity provider, please refer to [email or web address]. After hours, contact General Neurology

## 2017-02-16 NOTE — Progress Notes (Signed)
Attempt to place waist belt caused increased in agitation at 1600. Was unable to place. Currently have bedside sitter for safety, discussed with oncoming RN Tammy Sours who will continue to monitor.

## 2017-02-17 ENCOUNTER — Inpatient Hospital Stay (HOSPITAL_COMMUNITY): Payer: Medicare Other

## 2017-02-17 DIAGNOSIS — E785 Hyperlipidemia, unspecified: Secondary | ICD-10-CM

## 2017-02-17 DIAGNOSIS — R1312 Dysphagia, oropharyngeal phase: Secondary | ICD-10-CM

## 2017-02-17 DIAGNOSIS — I480 Paroxysmal atrial fibrillation: Secondary | ICD-10-CM

## 2017-02-17 DIAGNOSIS — I4891 Unspecified atrial fibrillation: Secondary | ICD-10-CM

## 2017-02-17 DIAGNOSIS — E782 Mixed hyperlipidemia: Secondary | ICD-10-CM

## 2017-02-17 DIAGNOSIS — J9601 Acute respiratory failure with hypoxia: Secondary | ICD-10-CM

## 2017-02-17 DIAGNOSIS — I639 Cerebral infarction, unspecified: Secondary | ICD-10-CM

## 2017-02-17 HISTORY — DX: Unspecified atrial fibrillation: I48.91

## 2017-02-17 LAB — BASIC METABOLIC PANEL
ANION GAP: 8 (ref 5–15)
BUN: 17 mg/dL (ref 6–20)
CALCIUM: 8.5 mg/dL — AB (ref 8.9–10.3)
CO2: 21 mmol/L — AB (ref 22–32)
Chloride: 112 mmol/L — ABNORMAL HIGH (ref 101–111)
Creatinine, Ser: 0.62 mg/dL (ref 0.44–1.00)
GFR calc non Af Amer: >60 mL/min (ref >60)
Glucose, Bld: 107 mg/dL — ABNORMAL HIGH (ref 65–99)
Potassium: 3.3 mmol/L — ABNORMAL LOW (ref 3.5–5.1)
Sodium: 141 mmol/L (ref 135–145)

## 2017-02-17 LAB — CBC
HCT: 33.3 % — ABNORMAL LOW (ref 36.0–46.0)
HEMOGLOBIN: 10.4 g/dL — AB (ref 12.0–15.0)
MCH: 30.9 pg (ref 26.0–34.0)
MCHC: 31.2 g/dL (ref 30.0–36.0)
MCV: 98.8 fL (ref 78.0–100.0)
Platelets: 156 10*3/uL (ref 150–400)
RBC: 3.37 MIL/uL — AB (ref 3.87–5.11)
RDW: 13.6 % (ref 11.5–15.5)
WBC: 10.5 10*3/uL (ref 4.0–10.5)

## 2017-02-17 MED ORDER — AMIODARONE HCL IN DEXTROSE 360-4.14 MG/200ML-% IV SOLN: 30.0000 mg/h | INTRAVENOUS | Status: DC

## 2017-02-17 MED ORDER — ESMOLOL HCL-SODIUM CHLORIDE 2000 MG/100ML IV SOLN
25.0000 ug/kg/min | INTRAVENOUS | Status: DC
  Administered 2017-02-17: 25 ug/kg/min via INTRAVENOUS
  Administered 2017-02-17: 50 ug/kg/min via INTRAVENOUS
  Administered 2017-02-18 (×2): 100 ug/kg/min via INTRAVENOUS
  Filled 2017-02-17 (×4): qty 100

## 2017-02-17 MED ORDER — POTASSIUM CHLORIDE 20 MEQ PO PACK
20.0000 meq | PACK | Freq: Two times a day (BID) | ORAL | Status: AC
  Administered 2017-02-17 (×2): 20 meq via ORAL
  Filled 2017-02-17 (×2): qty 1

## 2017-02-17 MED ORDER — AMIODARONE HCL IN DEXTROSE 360-4.14 MG/200ML-% IV SOLN
60.0000 mg/h | INTRAVENOUS | Status: AC
  Administered 2017-02-17 (×2): 60 mg/h via INTRAVENOUS
  Filled 2017-02-17 (×2): qty 200

## 2017-02-17 MED ORDER — METOPROLOL TARTRATE 50 MG PO TABS
50.0000 mg | ORAL_TABLET | Freq: Two times a day (BID) | ORAL | Status: DC
  Administered 2017-02-17 – 2017-02-22 (×10): 50 mg via ORAL
  Filled 2017-02-17 (×10): qty 1

## 2017-02-17 MED ORDER — PANTOPRAZOLE SODIUM 40 MG PO TBEC
40.0000 mg | DELAYED_RELEASE_TABLET | Freq: Every day | ORAL | Status: DC
  Administered 2017-02-17 – 2017-02-22 (×6): 40 mg via ORAL
  Filled 2017-02-17 (×6): qty 1

## 2017-02-17 MED ORDER — POTASSIUM CHLORIDE 10 MEQ/50ML IV SOLN: 10.0000 meq | INTRAVENOUS | Status: DC

## 2017-02-17 MED ORDER — HYDRALAZINE HCL 20 MG/ML IJ SOLN
10.0000 mg | INTRAMUSCULAR | Status: DC | PRN
  Administered 2017-02-17 – 2017-02-21 (×4): 10 mg via INTRAVENOUS
  Filled 2017-02-17 (×4): qty 1

## 2017-02-17 NOTE — Progress Notes (Signed)
eLink Physician-Brief Progress Note Patient Name: Kaitlin [name] DOB: [birth year] MRN: [id number]   Date of Service  02/17/2017  HPI/Events of Note  afib with RVR 140's  eICU Interventions  Start amiodarone infusion     Intervention Category Major Interventions: Arrhythmia - evaluation and management  [name] 02/17/2017, 4:05 AM

## 2017-02-17 NOTE — Progress Notes (Signed)
Modified Barium Swallow Progress Note  Patient Details  Name: Kaitlin [name] MRN: [id number] Date of Birth: [birth year]  Today's Date: 02/17/2017  Modified Barium Swallow completed.  Full report located under Chart Review in the Imaging Section.  Brief recommendations include the following:  Clinical Impression  Pt exhibits min-mild oropharyngeal dysphagia. Decreased oral cohesion resulting in premature spill x 1 into the laryngeal vestibule with trace remaining on upper anterior wall post swallow. Flash laryngeal penetration with straw sip thin likely due to increased velocity and volume. Mild intermittent vallecular stasis cleared with spontaneous additional swalllow. Recommend Dys 2 texture given oral deficits/deconditioning, thin liquids, no straws, pills whole in puree, sit upright, small bites/sips. Will continue to follow for safety with recommendations.     Swallow Evaluation Recommendations       SLP Diet Recommendations: Dysphagia 2 (Fine chop) solids;Thin liquid   Liquid Administration via: Cup;No straw   Medication Administration: Whole meds with puree   Supervision: Patient able to self feed;Full supervision/cueing for compensatory strategies   Compensations: Slow rate;Small sips/bites;Minimize environmental distractions;Multiple dry swallows after each bite/sip   Postural Changes: Seated upright at 90 degrees   Oral Care Recommendations: Oral care BID        Royce Macadamia 02/17/2017,1:27 PM   Breck Coons Lonell Face.Ed [employer] [phone]

## 2017-02-17 NOTE — Progress Notes (Addendum)
**  Preliminary report by tech**  Bilateral lower extremity venous duplex completed. There is no evidence of deep or superficial vein thrombosis involving the right and left lower extremities. All visualized vessels appear patent and compressible.  Incidental findings are consistent with a Baker's Cyst measuring 1.2 cm high by 3.5 cm wide by 3.1 cm long on the right.  02/17/17 11:52 AM [name] RVT

## 2017-02-17 NOTE — Progress Notes (Signed)
PULMONARY / CRITICAL CARE MEDICINE   Name: Yuktha Kerchner MRN: [id number] DOB: Jan 14, 1946    ADMISSION DATE:  02/14/2017 CONSULTATION DATE:  02/15/17  REFERRING MD:  Otelia Limes  CHIEF COMPLAINT:  AMS  BRIEF PATIENT SUMMARY:   Kaitlin [name] is a 71 y.o. F with PMH of  HLD and MS.  She presented to [facility] ED 7/17 with right sided weakness and aphasia.  She was working in the yard with husband around 11 am the morning prior and got very hot and was not talking normally.  She later took a shower and ate lunch and acted normal for the most part.  Later she was unable to get up and had right sided weakness.  She was brought to [facility] where CT demonstrated left MCA occlusion.  She was intubated and was taken to IR where she had b/l common carotid arteriograms and right verterbral arteriogram with complete revascularization of occluded left MCA and stent assisted angioplasty of symptomatic acute occlusion of prox left ICA.  She returned to the ICU on the vent and PCCM was asked to assist with vent management.  SUBJECTIVE:   Extubated 7/18, off neo Per RN, Afib w/RVR early this morning with rate in 150's-amio and esmolol started Currently SR on amio at 60 mg/hr  and esmolol 40mcg/kg/min  Neuro status status, going for MBS now  VITAL SIGNS: BP 119/70   Pulse 81   Temp 98.6 F (37 C) (Oral)   Resp (!) 31   Ht 5\' 2"  (1.575 m)   Wt 131 lb 6.3 oz (59.6 kg)   SpO2 95%   BMI 24.03 kg/m   HEMODYNAMICS:   VENTILATOR SETTINGS:   INTAKE / OUTPUT: I/O last 3 completed shifts: In: 4177.2 [I.V.:4177.2] Out: 905 [Urine:405; Emesis/NG output:500]  PHYSICAL EXAMINATION: General:  Adult elderly female lying in bed in NAD HEENT: MM pink/moist, slight right facial droop, pupils 3/= Neuro: Alert, slow mentation, oriented to person, follows commands, minimally holds RUE and RLE to gravity, normal left strength CV: SR 73, rrr, no murmur PULM: even/non-labored, lungs bilaterally clear, diminished in bases, on 3L  [place] at 95% GI: soft, non-tender, bs active  Extremities: warm/dry, no edema, right groin site wnl Skin: no rashes or lesions, scattered bruising to BUEs   LABS:  BMET  Recent Labs Lab 02/14/17 2030 02/14/17 2034 02/16/17 0830 02/17/17 0232  NA 138 139 141 141  K 4.0 4.0 4.3 3.3*  CL 107 107 115* 112*  CO2 20*  --  21* 21*  BUN 23* 26* 25* 17  CREATININE 0.84 1.00 0.65 0.62  GLUCOSE 96 94 114* 107*    Electrolytes  Recent Labs Lab 02/14/17 2030 02/16/17 0830 02/17/17 0232  CALCIUM 9.1 8.2* 8.5*  MG  --  2.1  --     CBC  Recent Labs Lab 02/15/17 0558 02/16/17 0830 02/17/17 0232  WBC 10.6* 10.0 10.5  HGB 9.8* 9.2* 10.4*  HCT 29.9* 29.2* 33.3*  PLT 218 162 156    Coag's  Recent Labs Lab 02/14/17 2030  APTT 25  INR 0.94    Sepsis Markers No results for input(s): LATICACIDVEN, PROCALCITON, O2SATVEN in the last 168 hours.  ABG  Recent Labs Lab 02/15/17 0322  PHART 7.245*  PCO2ART 44.1  PO2ART 192.0*    Liver Enzymes  Recent Labs Lab 02/14/17 2030  AST 25  ALT 20  ALKPHOS 58  BILITOT 0.4  ALBUMIN 4.0    Cardiac Enzymes  Recent Labs Lab 02/15/17 0800 02/15/17 1458 02/15/17  2015  TROPONINI <0.03 0.03* 0.03*    Glucose No results for input(s): GLUCAP in the last 168 hours.  Imaging Dg Chest Port 1 View  Result Date: 02/16/2017 CLINICAL DATA:  Intubation . EXAM: PORTABLE CHEST 1 VIEW COMPARISON:  Prior exam same date.  Prior exam 02/15/2017. FINDINGS: Endotracheal tube and NG tube in stable position. Heart size stable. Mild basilar subsegmental atelectasis. No focal alveolar infiltrate. No pleural effusion or pneumothorax . Cervicothoracic spine fusion . IMPRESSION: 1. Lines and tubes in stable position. 2. Mild bibasilar subsegmental atelectasis.  No focal infiltrate. Electronically Signed   By: Maisie Fus  Register   On: 02/16/2017 09:38     STUDIES:  CTA head 7/17 > focal hypodensity in left insula and left posterior putamen.   Left MCA perfusion anomaly.  CT head 7/17 > contrast staining vs blood products left basal ganglia to left temporal lobe. MRI/MRA 7/17> acute confluent infarct involving left putamen, caudate body, and corona radiata; 2 tiny cortical infarcts in the left MCA distribution; extensive chronic white matter disease, combination of patient's MS and chronic microvascular ischemia; continued good patency of recently re-cannulized left ICA and M1 segment; mild left M1 segment irregularity.  Moderate left M2 inferior division stenosis; mod to advanced stenosis of left V4 segment TTE 7/18 >> LVEF 65-70%, moderate concentric hypertrophy, no RWI, G1DD, AV mildly thickened and calcified w/mild stenosis, MV calcified and thickened with trivial regurgitation, trivial TR, no cardiac source of emboli identified BLE venous duplex >>>  CULTURES: None.  ANTIBIOTICS: None.  SIGNIFICANT EVENTS: 7/17 > admit.  Taken to IR for b/l common carotid arteriograms and right verterbral arteriogram with complete revascularization of occluded left MCA and stent assisted angioplasty of symptomatic acute occlusion of prox left ICA.  LINES/TUBES: ETT 7/17 > 7/18 OGT 7/17 > 7/18 R groin sheath 7/17 >7/17 at 1644 Right Aline 7/17 > 7/18  DISCUSSION: 71 y.o. F admitted with left MCA stroke, taken to IR for revascularization and L ICA stent, then returned to ICU on vent.  ASSESSMENT / PLAN: NEUROLOGIC A:   Acute L MCA stroke S/p b/l CCA and R vertebral arteriogram with L ICA stent Hx MS Delirium  P:   Management and further imaging per Neurology  Continue ASA, plavix, atorvastatin Continue Neurontin, prozac, effexor-XR LE venous doppler pending  PULMONARY A: Intubated for airway protection - resolved, extubated 7/18 At risk for aspiration  P:   O2 prn for sats > 94% Pulmonary hygiene with IS, and PT when able MBS pending today   CARDIOVASCULAR A:  Afib w/RVR - now SR Bradycardia- resolved Prolonged  QTc Hypotension- resolved Hx HLD - neg TTE 7/18 for source of emboli, LVEF 65-70, mild AS P:  Telemetry SBP goal 120-140 Continue Amio Wean Esmolol Cardiology consulted Avoid QTc prolonging meds Monitor QTc  RENAL A:   Hypokalemia - voiding post foley removal  P:   NS KVO KCL 20 meq x 2  Trend BMP / urinary output Replace electrolytes as indicated Avoid nephrotoxic agents, ensure adequate renal perfusion  GASTROINTESTINAL A:   GI ppx P:   PPI for SUP MBS pending   HEMATOLOGIC A:   DVT ppx P:  lovenox and SCDs CBC today and trend CBC  INFECTIOUS A:   No acute issues P:  Monitor fever curve/ Trend CBC  ENDOCRINE A:   No acute issues   P:  Monitor on BMET   FAMILY  - Updates: Husband updated at bedside 7/19  - Inter-disciplinary family meet or  Palliative Care meeting due by:  02/21/17  CCT 40 mins  Posey Boyer, AGACNP-BC [facility] Pgr: [phone] or if no answer [phone] 02/17/2017, 9:23 AM   Attending Note:  I have examined patient, reviewed labs, studies and notes. I have discussed the case with B Simpson, and I agree with the data and plans as amended above. 71 yo woman with hx MS, CAD, hyperlipidemia. Admitted after L MCA stroke and IR revascularization L MCA, stent proximal L ICA. She was able to be extubated on 7/18, has tolerated well. She has had some delirium - suspect due to her neuro injury, ICU delirium, possibly also recent sedating meds. She developed A fib + RVR overnight last night, started amiodarone and then esmolol for rate management. She converted back to NSR and is in sinus now. Per her husband this would be new A fib. On my eval she is awake, has word finding difficulty, appears confused, is clearly disoriented. She moves the left, can barely move her R hand. Able to lift R leg. We will wean esmolol off quickly now that back in NSR. Continue the amiodarone for now and ask cardiology to see her. She had  been seen by Dr Dorena Cookey at [facility] but they will want to transition her care to [facility]. Also, she has been followed by a neurologist [place] for her [place]. Will want her subsequent neuro f/u in [place] as well. Independent critical care time is 35 minutes.   PCCM will sign off. Please call if we can assist.   Levy Pupa, MD, PhD 02/17/2017, 11:19 AM [facility] [phone] or if no answer [phone]

## 2017-02-17 NOTE — Progress Notes (Signed)
Referring Physician(s): Dr. Marvel Plan  Supervising Physician: Julieanne Cotton  Patient Status: [facility] - In-pt  Chief Complaint: CVA  Subjective: Patient awake and extubated, but still unable to do much of anything with her right side.  Allergies: Penicillins  Medications: Prior to Admission medications   Medication Sig Start Date End Date Taking? Authorizing Provider  aspirin EC 81 MG tablet Take 81 mg by mouth daily.   Yes [provider]  Calcium Citrate-Vitamin D (CALCIUM CITRATE + D PO) Take 1 tablet by mouth daily.   Yes [provider]  FLUoxetine (PROZAC) 10 MG capsule Take 10 mg by mouth daily.   Yes [provider]  gabapentin (NEURONTIN) 300 MG capsule Take 300 mg by mouth as needed. 11/19/16  Yes [provider]  Multiple Vitamins-Minerals (PRESERVISION AREDS PO) Take 1 tablet by mouth 2 (two) times daily.   Yes [provider]  omega-3 acid ethyl esters (LOVAZA) 1 g capsule Take 1 g by mouth 2 (two) times daily. 1200 mg   Yes [provider]  pravastatin (PRAVACHOL) 10 MG tablet Take 10 mg by mouth daily.    Yes [provider]  venlafaxine XR (EFFEXOR-XR) 75 MG 24 hr capsule Take 75 mg by mouth daily.   Yes [provider]  traMADol (ULTRAM) 50 MG tablet Take 1 tablet (50 mg total) by mouth every 6 (six) hours as needed for pain. Patient not taking: Reported on 02/14/2017 04/26/12   Ivonne Andrew, PA-C    Vital Signs: BP 119/70   Pulse 81   Temp 98.6 F (37 C) (Oral)   Resp (!) 31   Ht 5\' 2"  (1.575 m)   Wt 131 lb 6.3 oz (59.6 kg)   SpO2 95%   BMI 24.03 kg/m   Physical Exam: Neuro: good strength and coordination with left side.  Can not move her RUE and can barely lift her RLE.  Imaging: Ct Angio Head W Or Wo Contrast  Result Date: 02/14/2017 CLINICAL DATA:  Code stroke. 71 y/o F; slurred speech and right-sided weakness. EXAM: CT ANGIOGRAPHY HEAD AND NECK TECHNIQUE: Multidetector  CT imaging of the head and neck was performed using the standard protocol during bolus administration of intravenous contrast. Multiplanar CT image reconstructions and MIPs were obtained to evaluate the vascular anatomy. Carotid stenosis measurements (when applicable) are obtained utilizing NASCET criteria, using the distal internal carotid diameter as the denominator. CONTRAST:  50 cc Isovue 370 COMPARISON:  04/26/2012 CT head FINDINGS: CT HEAD FINDINGS Brain: Focal loss of gray-white differentiation in the left insula (series 5, image 30) hypoattenuation within left posterior putamen. Stable background of moderate chronic microvascular ischemic changes the brain and mild brain parenchymal volume loss. No evidence for intracranial hemorrhage, hydrocephalus, or mass effect. Vascular: Left distal M1 hyperdensity (series 5, image 28). Skull: Normal. Negative for fracture or focal lesion. Sinuses/Orbits: No acute finding. Other: None. ASPECTS [facility] Stroke Program Early CT Score) - Ganglionic level infarction (caudate, lentiform nuclei, internal capsule, insula, M1-M3 cortex): 5 - Supraganglionic infarction (M4-M6 cortex): 3 Total score (0-10 with 10 being normal): 8 CTA NECK FINDINGS Aortic arch: Standard branching. Imaged portion shows no evidence of aneurysm or dissection. No significant stenosis of the major arch vessel origins. Moderate calcific atherosclerosis. Right carotid system: No evidence of dissection, stenosis (50% or greater) or occlusion. Mild calcific atherosclerosis of the bifurcation. 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 Patent left external  carotid artery, likely via collateralization. Vertebral arteries: Right dominant. No evidence of dissection, stenosis (50% or greater) or occlusion. Skeleton: C4 through C7 anterior fusion and discectomy. Hardware appears intact without apparent hardware related complication. Multilevel  prominent facet arthrosis. No high-grade bony canal stenosis. Other neck: Negative. Upper chest: Moderate centrilobular emphysema and mild biapical pleuroparenchymal scarring. Review of the MIP images confirms the above findings CTA HEAD FINDINGS Anterior circulation: Occlusion of the internal carotid artery to the distal cavernous segment. Paraclinoid and terminal left ICA segments are patent as is the very proximal left M1 likely due to blood flow via a diminutive left posterior communicating artery. Left mid and distal M1 occlusion. Poor left MCA distribution collateralization. Left A1 occlusion. Patent bilateral A2 and symmetric ACA circulation. The left distal ACA is likely perfused via the anterior communicating artery. Patent right MCA. Posterior circulation: No significant stenosis, proximal occlusion, aneurysm, or vascular malformation. The segments of mild stenosis in the left V4 segment. Venous sinuses: As permitted by contrast timing, patent. Anatomic variants: Bilateral posterior and anterior communicating artery are present. Delayed phase: No abnormal intracranial enhancement. Review of the MIP images confirms the above findings IMPRESSION: CT head: 1. Focal hypodensity in left insula and left posterior putamen compatible with infarction. 2. Hyperdensity and distal left M1. 3. ASPECTS is 8 CTA neck: 1. Occlusion of the left common and internal carotid artery from the arch to the distal cavernous segment. 2. Patent right common carotid system and vertebral arteries without dissection, aneurysm, or significant stenosis. 3. Moderate calcific atherosclerosis of the aortic arch 4. Centrilobular emphysema of lung apices. CTA head: 1. Patent paraclinoid and terminal segments of left internal carotid artery and minimal flow in proximal left M1 likely retrograde via a tiny left posterior communicating artery. 2. Occlusion of mid and distal left M1 with poor left MCA collateralization. 3. Left A1 occlusion.  Bilateral A2 are patent with the left likely perfused via the anterior communicating artery. Symmetric distal ACA circulation. 4. Patent right MCA and posterior circulation. 5. No intracranial aneurysm identified. These results were called by telephone at the time of interpretation on 02/14/2017 at 8:56 pm to Dr. Doug Sou ; [facility] , who verbally acknowledged these results. Electronically Signed   By: Mitzi Hansen M.D.   On: 02/14/2017 21:08   Ct Head Wo Contrast  Addendum Date: 02/15/2017   ADDENDUM REPORT: 02/15/2017 03:18 ADDENDUM: Acute findings discussed with and reconfirmed by Dr.Lindzen, Neurology on 02/15/2017 at 3:15 am. Electronically Signed   By: Awilda Metro M.D.   On: 02/15/2017 03:18   Result Date: 02/15/2017 CLINICAL DATA:  Follow-up code stroke. Status post revascularization of occluded LEFT middle cerebral artery and angioplasty of occluded proximal LEFT internal carotid artery. EXAM: CT HEAD WITHOUT CONTRAST TECHNIQUE: Contiguous axial images were obtained from the base of the skull through the vertex without intravenous contrast. COMPARISON:  CT HEAD February 14, 2017 FINDINGS: BRAIN: Abnormal contiguous density LEFT basal ganglia to anteromedial temporal lobe. No mass effect or midline shift. Ventricles and sulci are overall normal for patient's age. Confluent supratentorial white matter hypodensities. Relative preservation of gray-white matter differentiation in areas not obscured by new density. No abnormal extra-axial fluid collections. Basal cisterns are patent. VASCULAR: Moderate calcific atherosclerosis of the carotid siphons. SKULL: No skull fracture. Severe temporomandibular osteoarthrosis. No significant scalp soft tissue swelling. SINUSES/ORBITS: The mastoid air-cells and included paranasal sinuses are well-aerated.The included ocular globes and orbital contents are non-suspicious. Status post bilateral ocular lens implants. OTHER: Life-support  lines in  place. IMPRESSION: 1. Contrast staining versus blood products LEFT basal ganglia to LEFT temporal lobe. Recommend 6-12 hour follow-up CT. 2. Moderate to severe chronic small vessel ischemic disease. These results will be called to the ordering clinician or representative by the Radiologist Assistant, and communication documented in the [employer] Dashboard. Electronically Signed: By: Awilda Metro M.D. On: 02/15/2017 03:01   Ct Angio Neck W Or Wo Contrast  Result Date: 02/14/2017 CLINICAL DATA:  Code stroke. 71 y/o F; slurred speech and right-sided weakness. EXAM: CT ANGIOGRAPHY HEAD AND NECK TECHNIQUE: Multidetector CT imaging of the head and neck was performed using the standard protocol during bolus administration of intravenous contrast. Multiplanar CT image reconstructions and MIPs were obtained to evaluate the vascular anatomy. Carotid stenosis measurements (when applicable) are obtained utilizing NASCET criteria, using the distal internal carotid diameter as the denominator. CONTRAST:  50 cc Isovue 370 COMPARISON:  04/26/2012 CT head FINDINGS: CT HEAD FINDINGS Brain: Focal loss of gray-white differentiation in the left insula (series 5, image 30) hypoattenuation within left posterior putamen. Stable background of moderate chronic microvascular ischemic changes the brain and mild brain parenchymal volume loss. No evidence for intracranial hemorrhage, hydrocephalus, or mass effect. Vascular: Left distal M1 hyperdensity (series 5, image 28). Skull: Normal. Negative for fracture or focal lesion. Sinuses/Orbits: No acute finding. Other: None. ASPECTS [facility] Stroke Program Early CT Score) - Ganglionic level infarction (caudate, lentiform nuclei, internal capsule, insula, M1-M3 cortex): 5 - Supraganglionic infarction (M4-M6 cortex): 3 Total score (0-10 with 10 being normal): 8 CTA NECK FINDINGS Aortic arch: Standard branching. Imaged portion shows no evidence of aneurysm or dissection. No significant stenosis  of the major arch vessel origins. Moderate calcific atherosclerosis. Right carotid system: No evidence of dissection, stenosis (50% or greater) or occlusion. Mild calcific atherosclerosis of the bifurcation. 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 Patent left external carotid artery, likely via collateralization. Vertebral arteries: Right dominant. No evidence of dissection, stenosis (50% or greater) or occlusion. Skeleton: C4 through C7 anterior fusion and discectomy. Hardware appears intact without apparent hardware related complication. Multilevel prominent facet arthrosis. No high-grade bony canal stenosis. Other neck: Negative. Upper chest: Moderate centrilobular emphysema and mild biapical pleuroparenchymal scarring. Review of the MIP images confirms the above findings CTA HEAD FINDINGS Anterior circulation: Occlusion of the internal carotid artery to the distal cavernous segment. Paraclinoid and terminal left ICA segments are patent as is the very proximal left M1 likely due to blood flow via a diminutive left posterior communicating artery. Left mid and distal M1 occlusion. Poor left MCA distribution collateralization. Left A1 occlusion. Patent bilateral A2 and symmetric ACA circulation. The left distal ACA is likely perfused via the anterior communicating artery. Patent right MCA. Posterior circulation: No significant stenosis, proximal occlusion, aneurysm, or vascular malformation. The segments of mild stenosis in the left V4 segment. Venous sinuses: As permitted by contrast timing, patent. Anatomic variants: Bilateral posterior and anterior communicating artery are present. Delayed phase: No abnormal intracranial enhancement. Review of the MIP images confirms the above findings IMPRESSION: CT head: 1. Focal hypodensity in left insula and left posterior putamen compatible with infarction. 2. Hyperdensity and distal left M1. 3. ASPECTS  is 8 CTA neck: 1. Occlusion of the left common and internal carotid artery from the arch to the distal cavernous segment. 2. Patent right common carotid system and vertebral arteries without dissection, aneurysm, or significant stenosis. 3. Moderate calcific atherosclerosis of  the aortic arch 4. Centrilobular emphysema of lung apices. CTA head: 1. Patent paraclinoid and terminal segments of left internal carotid artery and minimal flow in proximal left M1 likely retrograde via a tiny left posterior communicating artery. 2. Occlusion of mid and distal left M1 with poor left MCA collateralization. 3. Left A1 occlusion. Bilateral A2 are patent with the left likely perfused via the anterior communicating artery. Symmetric distal ACA circulation. 4. Patent right MCA and posterior circulation. 5. No intracranial aneurysm identified. These results were called by telephone at the time of interpretation on 02/14/2017 at 8:56 pm to Dr. Doug Sou ; [facility] , who verbally acknowledged these results. Electronically Signed   By: Mitzi Hansen M.D.   On: 02/14/2017 21:08   Mr Brain Wo Contrast  Result Date: 02/15/2017 CLINICAL DATA:  Slurred speech and right-sided weakness EXAM: MRI HEAD WITHOUT CONTRAST MRA HEAD WITHOUT CONTRAST TECHNIQUE: Multiplanar, multiecho pulse sequences of the brain and surrounding structures were obtained without intravenous contrast. Angiographic images of the head were obtained using MRA technique without contrast. COMPARISON:  Head CT and CTA from earlier today FINDINGS: MRI HEAD FINDINGS Brain: Restricted diffusion in the left putamen, caudate body, and intervening corona radiata. At least 2 punctate cortical infarcts along the posterior left frontal convexity. No hemorrhage by MRI. No hydrocephalus or masslike findings. Extensive FLAIR hyperintensity in the cerebral white matter. Milder changes are seen in the pons. Vascular: Vascular findings described below. Skull and upper  cervical spine: Negative for marrow lesion Sinuses/Orbits: Bilateral cataract resection. MRA HEAD FINDINGS Recannulized left ICA which is smooth and widely patent. Mild vessel irregularity of the left M1 segment, but no flow limiting stenosis. There is a moderate stenosis involving the left inferior division M2 branch. Left MCA branches are less robustly opacified. Aplastic left A1 segment. No suspected superimposed acute A1 occlusion based on prior CTA. Right MCA branches are widely patent. Mildly dominant right vertebral artery. Mid left V4 segment stenosis, at least moderate. Basilar artery is smooth and widely patent. Small posterior communicating arteries are present. Both PCAs have symmetric robust flow. Incidental fenestration at the vertebrobasilar junction. IMPRESSION: 1. Acute confluent infarct involving the left putamen, caudate body, and corona radiata. 2 tiny cortical infarcts in the left MCA distribution. 2. Extensive chronic white matter disease, some combination of patient's multiple sclerosis and chronic microvascular ischemia. 3. Continued good patency of recently re- cannulized left ICA and M1 segment. 4. Mild left M1 segment irregularity. Moderate left M2 inferior division stenosis. 5. Moderate to advanced stenosis of the left V4 segment. Electronically Signed   By: Marnee Spring M.D.   On: 02/15/2017 14:45   Ir Delsa Sale Stent Cerv Carotid W/o Emb-prot Mod Sed  Result Date: 02/16/2017 INDICATION: Stuttering speech progressing to expressive difficulty, mild confusion, and mild right-sided weakness. CT angiogram of the head and neck revealing occluded left common carotid artery, left internal carotid artery, supraclinoid left ICA and left middle cerebral artery. Hyperdense left MCA sign. EXAM: 1. EMERGENT LARGE VESSEL OCCLUSION THROMBOLYSIS (anterior CIRCULATION) COMPARISON:  CT angiogram of the head and neck of 02/14/2017. MEDICATIONS: Vancomycin 1 g IV was administered within 1 hour of the  procedure. ANESTHESIA/SEDATION: General anesthesia. CONTRAST:  Isovue 300 approximately 120 mL. FLUOROSCOPY TIME:  Fluoroscopy Time: 96 minutes 54 seconds (2646 mGy). COMPLICATIONS: None immediate. TECHNIQUE: Following a full explanation of the procedure along with the potential associated complications, an informed witnessed consent was obtained from the patient's husband. The risks of intracranial hemorrhage of 10%,  worsening neurological deficit, ventilator dependency, death and inability to revascularize were all reviewed in detail with the patient's husband. The patient was then put under general anesthesia by the Department of Anesthesiology at [facility]. The right groin was prepped and draped in the usual sterile fashion. Thereafter using modified Seldinger technique, transfemoral access into the right common femoral artery was obtained without difficulty. Over a 0.035 inch guidewire a 5 French Pinnacle sheath was inserted. Through this, and also over a 0.035 inch guidewire a 5 [place] JB 1 catheter was advanced to the aortic arch region and selectively positioned in the innominate artery, right common carotid artery and the leftCommon carotid artery. FINDINGS: The innominate artery injection demonstrates a focal approximately 50% stenosis of the right subclavian artery at its origin. Distal to this wide patency is seen of the right vertebral artery with moderate tortuosity. The right vertebral artery is seen to opacify normally to the cranial skull base to opacify the right vertebrobasilar junction proximally, and also the right posterior-inferior cerebral artery. The right common carotid arteriogram demonstrates common carotid artery to have mild caliber irregularity in its mid 1/3. The right external carotid artery at its origin and branches is seen to opacify normally. The right internal carotid artery at the bulb to the cranial skull base opacifies widely. The petrous, cavernous and supraclinoid  segments demonstrate wide patency. The right middle cerebral artery and the right anterior cerebral artery opacify into the capillary and venous phases. Also demonstrated is prompt opacification via the anterior communicating artery of the left anterior cerebral A2 segment with filling defect noted in the left anterior cerebral A1 segment distally. The delayed arterial phase demonstrates excellent collaterals from the pericallosal, and the callosal marginal branches of the left anterior cerebral arteries retrogradely opacifying the frontal parietal, parietooccipital regions and the perisylvian branches of the perisylvian triangle. A prominent area of hyperperfusion is seen involving the left posterior frontal parietal and frontotemporal subcortical regions. The left common carotid arteriogram demonstrates extremely slow ascent of contrast to a heavily calcified plaque extending from the left common carotid artery distally into the left external and internal carotid arteries. No distal reconstitution is noted beyond the thick calcified plaque. PROCEDURE: ENDOVASCULAR REVASCULARIZATION OF ACUTELY OCCLUDED SYMPTOMATIC LEFT COMMON CAROTID ARTERY DISTALLY EXTENDING INTO THE LEFT INTERNAL CAROTID ARTERY, THE LEFT INTERNAL CAROTID ARTERY SUPRACLINOID SEGMENT AND THE LEFT INTERNAL CAROTID ARTERY TERMINUS, AND OF THE LEFT MIDDLE CEREBRAL ARTERY PROXIMALLY WITH SOLITAIRE 4 MM X 40 MM FR RETRIEVAL DEVICE. The diagnostic JB 1 catheter in the left common carotid artery was exchanged over a 0.035 inch 300 cm Rosen exchange guidewire for a 55 cm 8 French Brite tip neurovascular sheath using biplane roadmap technique and constant fluoroscopic guidance. Good aspiration obtained from the hub of the 8 French neurovascular sheath which had its origin in the left common carotid artery. A gentle contrast injection demonstrated no evidence of spasms or dissections. This was then connected to continuous heparinized saline infusion. Over  the [employer] guidewire, an 8 [place] 85 cm FlowGate balloon guide catheter which had been prepped with 50% contrast and 50% heparinized saline infusion was advanced and positioned just proximal to the heavily calcified plaque in the left common carotid bifurcation. The guidewire was removed. Good aspiration obtained from the hub of the [facility] guide catheter. Again a gentle contrast injection demonstrated no evidence of spasms, dissections or of intraluminal filling defects. Approximately 1.8 mg of intra-arterial Integrilin was infused through the [facility] guide catheter at the  site of the proximal left internal carotid artery, at the site of the heavily calcified plaque. A rapid transit 2 tip microcatheter was then advanced over a 0.014 inch Softip Transend EX micro guidewire to the distal end of the [facility] guide catheter. The micro guidewire had a slight delay in the configuration to avoid dissections or inducing spasm. Thereafter, a torque device was then used to probe the occluded left internal carotid artery at the bulb with the micro guidewire with the help of the microcatheter. Multiple attempts were made using different wires without success. The micro catheter micro guidewire combination was removed. At this time, a 5 [place] JB 1 catheter was advanced over a 0.035 inch Roadrunner guidewire to the distal end of the [facility] guide catheter. With gentle manipulation, free access was obtained with the 0.035 inch Roadrunner guidewire. This was advanced to the distal vertical segment of the left internal carotid artery. The [place] JB 1 catheter was advanced distal to the occlusion of the left internal carotid artery. Aspiration of blood was obtained from the hub of the [facility] guide catheter. A gentle contrast injection demonstrated antegrade flow albeit slowly to the vertical segment without distal revascularization of the right internal carotid artery. At this time, the Roadrunner guidewire was  removed. The 5 [place] JB 1 catheter was then exchanged for a 0.014 inch Softip Transend 300 cm exchange micro guidewire the tip of which was positioned in the petrous segment of the left internal carotid artery. The distal tip of the exchange guidewire had a J configuration to avoid dissections or inducing spasm. The diagnostic JB 1 catheter was retrieved and removed. A control arteriogram performed through the 8 [place] FlowGate guide catheter in the left internal carotid artery origin now demonstrated a weak string sign angiographically in the proximal 1/3 of the left internal carotid artery. A 4 mm x 30 mm Viatrac 14 angioplasty balloon guide catheter was retrogradely flushed with heparinized saline infusion. Using the rapid exchange technique this was then advanced and positioned such that the proximal and the distal markers were adequate distance from the site of the acute occlusion of the left internal carotid artery proximally. At this time a slow inflation was then performed using a micro inflation syringe device via micro tubing. The balloon was inflated to 13 atmospheres achieving a 4.16 mm diameter. This was maintained for approximately 20 seconds. The balloon was then gently deflated and retrieved proximally ensuring no change in the position of the distal exchange micro guidewire in the petrous segment of the left internal carotid artery. A control arteriogram performed through the 8 [place] FlowGate guide catheter in the left internal carotid artery bulb now demonstrated antegrade flow into a decompressed left internal carotid artery and cervical segment and also distally. Control angiograms centered intracranially demonstrated opacification of the petrous, the cavernous segments and the proximal supraclinoid segments. There was no opacification of the left middle cerebral artery or the left anterior cerebral artery. Over the exchange micro guidewire, a Trevo ProVue microcatheter was then advanced to the  horizontal petrous segment of the left internal carotid artery. The exchange micro guidewire was then retrieved and removed. Good aspiration obtained from the hub of the microcatheter. Over a 0.014 inch Softip Synchro micro guidewire, the Trevo ProVue microcatheter was now advanced to the supraclinoid left ICA. The micro guidewire was then gently advanced into the occluded left middle cerebral artery to the M2 M3 region of the inferior division of the left middle cerebral artery followed by the  microcatheter. The guidewire was removed. Slow aspiration obtained from the hub of the microcatheter. This was retrieved gently slightly until free aspiration was obtained. A very gentle constant injection through the microcatheter demonstrated slow flow into the left MCA inferior division parietal branch. At this time, a 4 mm x 40 mm FR retrieval device was advanced in a coaxial manner and with constant heparinized saline infusion using biplane roadmap technique to the distal end of the microcatheter in the M2 M3 region of the inferior division of the left middle cerebral artery. The O ring on the delivery microcatheter was now loosened. With slight forward traction with the right hand on the delivery micro guidewire, with the left hand the delivery microcatheter was gently retrieved unsheathing the distal and the proximal portion of the retrieval device. The distal tip of the microcatheter was just proximal to the proximal portion of the retrieval device. A control arteriogram performed through the 8 [place] FlowGate guide catheter in the left internal carotid artery at the bulb demonstrates slow antegrade flow to the cranial skull base with achievement of a TICI 2b reperfusion. The balloon of the [facility] guide catheter was then inflated at the origin of the left internal carotid artery for proximal flow arrest. The proximal portion of the retrieval device was captured into the microcatheter. Thereafter the combination of  the retrieval device in the microcatheter was gently retrieved as constant aspiration was applied at the hub of the [facility] guide catheter with a 60 mL syringe. Aspiration was continued as the combination was retrieved and removed. The retrieval device was seen to have captured dark clot within its interstices. This was then retrieved and removed. Aspiration was continued as the balloon was deflated in the left internal carotid artery bulb region. Despite the deflated balloon, there continued to be lack of free aspiration. The FlowGate guide catheter was then retrieved gently more proximally until there was free aspiration of blood. Free back bleed was noted at the hub of the [employer] [place]. A control arteriogram performed through the [facility] guide catheter just proximal to the origin of the left internal carotid artery demonstrated near complete occlusion of the previously angioplastied site with trickle flow noted into and now the large left internal carotid artery proximally. However, slow flow was noted into the distal left internal carotid at the cranial skull base. Again axis through this near complete reocclusion was attempted again with the combination of the microcatheter and the micro guidewire without success. A 5 [place] JB 1 catheter was then advanced over a 0.035 inch Roadrunner guidewire to the distal end of the 8 [place] FlowGate guide catheter. Using gentle control of the 0.035 inch Roadrunner guidewire, access was obtained without difficulty through the re-occluded left internal carotid artery at the bulb with the JB 1 catheter being advanced distal to this. The 0.035 inch Roadrunner guidewire was removed. Good aspiration obtained from the hub of the JB 1 catheter. This in turn was then exchanged for a 0.014 inch Softip Transend 300 cm exchange micro guidewire to the distal end of which was now in the petrous portion of the left internal carotid artery. The 5 [place] JB 1 catheter was retrieved and  removed. A gentle control arteriogram performed through the 8 [place] FlowGate guide catheter demonstrated very slow flow through the nearly reoccluded left internal carotid artery at the bulb. This prompted the advancement of a 4 mm x 30 mm Viatrac 14 balloon angioplasty micro microcatheter which had been flushed retrogradely using the rapid  exchange technique and positioned at the site of the re- occlusion of the proximal left internal carotid artery at the bulb. A slow control inflation was now performed using the inflation syringe device via micro tubing. The balloon was inflated to 12 atmospheres achieving a diameter of over .014 atmospheres where it was maintained for approximately 20 seconds. The balloon was then gently deflated and retrieved and removed now with the distal end of the exchange wire maintained in the petrous segment. A control arteriogram performed through the 8 [place] FlowGate guide catheter in the left internal carotid artery at the bulb demonstrated now antegrade flow noted distally into the left internal carotid artery. Measurements were performed of the left internal carotid artery distally and proximal to the high-grade stenosis secondary to the thick heavily calcified plaque. A 6 mm-8 mm x 40 mm Xact stent delivery system was retrogradely prepped with heparinized saline infusion. Again using the rapid exchange technique, this was advanced to the distal end of the [facility] guide catheter. The stent delivery system was then advanced such that the distal and the proximal markers were optimally positioned. This stent was then deployed without difficulty. The delivery catheter was retrieved and removed as the distal end of the wire was maintained in the petrous segment. A control arteriogram performed through the 8 [place] FlowGate guide catheter now demonstrated plentiful distal flow of contrast through the stented segment and intracranially. Intracranially this demonstrated complete  revascularization of the left middle cerebral artery distribution. The hypoplastic left anterior cerebral A1 segment was noted. A post stent placement balloon angioplasty was then performed using a 5 mm x 30 mm Viatrac 14 balloon angioplasty catheter which was again advanced using the rapid exchange technique to the waist of the intra stent region. Again a slow inflation was performed to 13 atmospheres achieving a 5.34 mm diameter. This was done with a micro inflation syringe device via micro tubing. The balloon was left inflated for approximately 20 seconds. Upon deflation and proximal retrieval and removal, a control arteriogram performed through the [facility] guide catheter demonstrated excellent revascularization of the previously occluded left internal carotid artery at the bulb at the site of the heavily calcified plaque. There was spasm noted just distal to the device which responded to 20 mics of intra-arterial nitroglycerin. A control arteriogram performed at 15 and 30 minutes extra cranially and intracranially continued to demonstrate excellent flow through the stented segment and intracranially. Because of the mild filling defects noted at the site of the angioplasty of the left internal carotid artery, the patient was given additional intra-arterial Integrilin through the 8 [place] FlowGate guide catheter and another 6.3 mg were given with gradual clearance of any tiny filling defects at the site of the angioplasty within stent. Intracranially there continued to be excellent flow through the petrous, cavernous and supraclinoid segments. The left MCA continued to be widely patent. Only flash filling of the hypoplastic left anterior cerebral A1 segment was noted. There was no angiographic evidence of extravasation or mass-effect noted. The patient's hemodynamic status and neurological status remained stable throughout the procedure. No significant bradycardia was noted at the time of the angioplasty within  the stented segment of the left internal carotid artery proximally. The patient had been pretreated. Following the final control arteriogram, the exchange micro guidewire was then gently retrieved and removed. A final control arteriogram again continued to demonstrate excellent flow through the stented segment and also intracranially. The 8 [place] FlowGate guide catheter and the 8 [place] neurovascular sheath  were then retrieved into the abdominal aorta and exchanged over a J-tip guidewire for a 9 French Pinnacle sheath. This in turn was then connected to continuous heparinized saline infusion. The patient's right groin site continued be soft without evidence of a hematoma. The distal pulses remained 2+ in the dorsalis pedis bilaterally, and 1+ in the posterior tibials bilaterally postprocedure. The patient was then transferred to the CT scanner for postprocedural CT scan of the brain. IMPRESSION: Status post endovascular complete revascularization of occluded left middle cerebral artery with 1 pass with 4 mm x 40 mm Solitaire FR retrieval device achieving a TICI 3 reperfusion. 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 Patient loaded with the 650 mg of aspirin, and 300 mg of Plavix approximately 20 minutes prior to angioplasty of the left internal carotid artery proximally. PLAN: CT scan of the brain postprocedure. Electronically Signed   By: Julieanne Cotton M.D.   On: 02/15/2017 23:12   Ct Cerebral Perfusion W Contrast  Result Date: 02/14/2017 CLINICAL DATA:  71 y/o  F; CT perfusion due to stroke. EXAM: CT PERFUSION BRAIN TECHNIQUE: Multiphase CT imaging of the brain was performed following IV bolus contrast injection. Subsequent parametric perfusion maps were calculated using  RAPID software. CONTRAST:  50 cc Isovue 370 COMPARISON:  CT angiogram head and neck dated 02/14/2017 FINDINGS: CT Brain Perfusion Findings: CBF (<30%) Volume: 0mL Perfusion (Tmax>6.0s) volume: 33mL Mismatch Volume: 33mL Infarction Location:Left MCA distribution IMPRESSION: 1. Left MCA distribution perfusion anomaly. Ischemic penumbra of 33 cc calculated by mismatch volume. 2. 0 cc infarct core (CBF <30%) by perfusion. This does not included the infarct core volume of the ASPECT 8 infarct on non contrast CT of the head involving the part of the left insula and putamen as visible infarct on noncontrast CT can be pseudo normalized on perfusion. These results were called by telephone at the time of interpretation on 02/14/2017 at 10:01 pm to Dr. Caryl Pina , who verbally acknowledged these results. Electronically Signed   By: Mitzi Hansen M.D.   On: 02/14/2017 22:01   Dg Chest Port 1 View  Result Date: 02/16/2017 CLINICAL DATA:  Intubation . EXAM: PORTABLE CHEST 1 VIEW COMPARISON:  Prior exam same date.  Prior exam 02/15/2017. FINDINGS: Endotracheal tube and NG tube in stable position. Heart size stable. Mild basilar subsegmental atelectasis. No focal alveolar infiltrate. No pleural effusion or pneumothorax . Cervicothoracic spine fusion . IMPRESSION: 1. Lines and tubes in stable position. 2. Mild bibasilar subsegmental atelectasis.  No focal infiltrate. Electronically Signed   By: Maisie Fus  Register   On: 02/16/2017 09:38   Portable Chest Xray  Result Date: 02/16/2017 CLINICAL DATA:  Stroke. EXAM: PORTABLE CHEST 1 VIEW COMPARISON:  02/15/2017 FINDINGS: Endotracheal tube, NG tube in stable position. Heart size stable. Interim improvement of left base subsegmental atelectasis. No focal infiltrate. No pleural effusion or pneumothorax. Prior cervicothoracic spine fusion . IMPRESSION: 1. Lines and tubes in stable position. 2. Interim improved aeration left lung base. No acute cardiopulmonary disease  identified. Electronically Signed   By: Maisie Fus  Register   On: 02/16/2017 08:11   Portable Chest Xray  Result Date: 02/15/2017 CLINICAL DATA:  Acute hypoxemic respiratory failure. Endotracheal tube placement. EXAM: PORTABLE CHEST 1 VIEW COMPARISON:  04/26/2012 CXR FINDINGS: The tip of an endotracheal tube is 5.1  cm above the carina in satisfactory position. A gastric tube is seen extending below the left hemidiaphragm with side port below the GE junction. Heart is top-normal in size with aortic atherosclerosis. Streaky atelectasis at the left lung base medially. No overt pulmonary edema. No pneumonic consolidation. ACDF of the lower cervical spine. IMPRESSION: 1. ACDF of the lower cervical spine. 2. Satisfactory support line and tube positions. 3. Atelectasis and/or scarring at the left lung base. 4. Aortic atherosclerosis. Electronically Signed   By: Tollie Eth M.D.   On: 02/15/2017 03:16   Mr Maxine Glenn Head/brain ZO Cm  Result Date: 02/15/2017 CLINICAL DATA:  Slurred speech and right-sided weakness EXAM: MRI HEAD WITHOUT CONTRAST MRA HEAD WITHOUT CONTRAST TECHNIQUE: Multiplanar, multiecho pulse sequences of the brain and surrounding structures were obtained without intravenous contrast. Angiographic images of the head were obtained using MRA technique without contrast. COMPARISON:  Head CT and CTA from earlier today FINDINGS: MRI HEAD FINDINGS Brain: Restricted diffusion in the left putamen, caudate body, and intervening corona radiata. At least 2 punctate cortical infarcts along the posterior left frontal convexity. No hemorrhage by MRI. No hydrocephalus or masslike findings. Extensive FLAIR hyperintensity in the cerebral white matter. Milder changes are seen in the pons. Vascular: Vascular findings described below. Skull and upper cervical spine: Negative for marrow lesion Sinuses/Orbits: Bilateral cataract resection. MRA HEAD FINDINGS Recannulized left ICA which is smooth and widely patent. Mild vessel  irregularity of the left M1 segment, but no flow limiting stenosis. There is a moderate stenosis involving the left inferior division M2 branch. Left MCA branches are less robustly opacified. Aplastic left A1 segment. No suspected superimposed acute A1 occlusion based on prior CTA. Right MCA branches are widely patent. Mildly dominant right vertebral artery. Mid left V4 segment stenosis, at least moderate. Basilar artery is smooth and widely patent. Small posterior communicating arteries are present. Both PCAs have symmetric robust flow. Incidental fenestration at the vertebrobasilar junction. IMPRESSION: 1. Acute confluent infarct involving the left putamen, caudate body, and corona radiata. 2 tiny cortical infarcts in the left MCA distribution. 2. Extensive chronic white matter disease, some combination of patient's multiple sclerosis and chronic microvascular ischemia. 3. Continued good patency of recently re- cannulized left ICA and M1 segment. 4. Mild left M1 segment irregularity. Moderate left M2 inferior division stenosis. 5. Moderate to advanced stenosis of the left V4 segment. Electronically Signed   By: Marnee Spring M.D.   On: 02/15/2017 14:45   Ir Percutaneous Art Thrombectomy/infusion Intracranial Inc Diag Angio  Result Date: 02/16/2017 INDICATION: Stuttering speech progressing to expressive difficulty, mild confusion, and mild right-sided weakness. CT angiogram of the head and neck revealing occluded left common carotid artery, left internal carotid artery, supraclinoid left ICA and left middle cerebral artery. Hyperdense left MCA sign. EXAM: 1. EMERGENT LARGE VESSEL OCCLUSION THROMBOLYSIS (anterior CIRCULATION) COMPARISON:  CT angiogram of the head and neck of 02/14/2017. MEDICATIONS: Vancomycin 1 g IV was administered within 1 hour of the procedure. ANESTHESIA/SEDATION: General anesthesia. CONTRAST:  Isovue 300 approximately 120 mL. FLUOROSCOPY TIME:  Fluoroscopy Time: 96 minutes 54 seconds  (2646 mGy). COMPLICATIONS: None immediate. TECHNIQUE: Following a full explanation of the procedure along with the potential associated complications, an informed witnessed consent was obtained from the patient's husband. The risks of intracranial hemorrhage of 10%, worsening neurological deficit, ventilator dependency, death and inability to revascularize were all reviewed in detail with the patient's husband. The patient was then put under general anesthesia by the Department of Anesthesiology at [facility]. The right groin was prepped and draped in the usual sterile fashion. Thereafter using modified Seldinger technique, transfemoral access into the right common femoral artery was obtained without difficulty. Over a 0.035 inch guidewire a 5 French Pinnacle sheath was inserted. Through this, and also over a 0.035 inch guidewire a 5 [place] JB 1 catheter was advanced to the aortic arch region and selectively positioned in the innominate artery, right common carotid artery and the leftCommon carotid artery. FINDINGS: The innominate artery injection demonstrates a focal approximately 50% stenosis of the right subclavian artery at its origin. Distal to this wide patency is seen of the right vertebral artery with moderate tortuosity. The right vertebral artery is seen to opacify normally to the cranial skull base to opacify the right vertebrobasilar junction proximally, and also the right posterior-inferior cerebral artery. The right common carotid arteriogram demonstrates common carotid artery to have mild caliber irregularity in its mid 1/3. The right external carotid artery at its origin and branches is seen to opacify normally. The right internal carotid artery at the bulb to the cranial skull base opacifies widely. The petrous, cavernous and supraclinoid segments demonstrate wide patency. The right middle cerebral artery and the right anterior cerebral artery opacify into the capillary and venous phases.  Also demonstrated is prompt opacification via the anterior communicating artery of the left anterior cerebral A2 segment with filling defect noted in the left anterior cerebral A1 segment distally. The delayed arterial phase demonstrates excellent collaterals from the pericallosal, and the callosal marginal branches of the left anterior cerebral arteries retrogradely opacifying the frontal parietal, parietooccipital regions and the perisylvian branches of the perisylvian triangle. A prominent area of hyperperfusion is seen involving the left posterior frontal parietal and frontotemporal subcortical regions. The left common carotid arteriogram demonstrates extremely slow ascent of contrast to a heavily calcified plaque extending from the left common carotid artery distally into the left external and internal carotid arteries. No distal reconstitution is noted beyond the thick calcified plaque. PROCEDURE: ENDOVASCULAR REVASCULARIZATION OF ACUTELY OCCLUDED SYMPTOMATIC LEFT COMMON CAROTID ARTERY DISTALLY EXTENDING INTO THE LEFT INTERNAL CAROTID ARTERY, THE LEFT INTERNAL CAROTID ARTERY SUPRACLINOID SEGMENT AND THE LEFT INTERNAL CAROTID ARTERY TERMINUS, AND OF THE LEFT MIDDLE CEREBRAL ARTERY PROXIMALLY WITH SOLITAIRE 4 MM X 40 MM FR RETRIEVAL DEVICE. The diagnostic JB 1 catheter in the left common carotid artery was exchanged over a 0.035 inch 300 cm Rosen exchange guidewire for a 55 cm 8 [place] Brite tip neurovascular sheath using biplane roadmap technique and constant fluoroscopic guidance. Good aspiration obtained from the hub of the 8 [place] neurovascular sheath which had its origin in the left common carotid artery. A gentle contrast injection demonstrated no evidence of spasms or dissections. This was then connected to continuous heparinized saline infusion. Over the [employer] guidewire, an 8 [place] 85 cm FlowGate balloon guide catheter which had been prepped with 50% contrast and 50% heparinized saline  infusion was advanced and positioned just proximal to the heavily calcified plaque in the left common carotid bifurcation. The guidewire was removed. Good aspiration obtained from the hub of the [facility] guide catheter. Again a gentle contrast injection demonstrated no evidence of spasms, dissections or of intraluminal filling defects. Approximately 1.8 mg of intra-arterial Integrilin was infused through the [facility] guide catheter at the site of the proximal left internal carotid artery, at the site of the heavily calcified plaque. A rapid transit 2 tip microcatheter was then advanced over a 0.014 inch Softip Transend EX micro guidewire  to the distal end of the [facility] guide catheter. The micro guidewire had a slight delay in the configuration to avoid dissections or inducing spasm. Thereafter, a torque device was then used to probe the occluded left internal carotid artery at the bulb with the micro guidewire with the help of the microcatheter. Multiple attempts were made using different wires without success. The micro catheter micro guidewire combination was removed. At this time, a 5 [place] JB 1 catheter was advanced over a 0.035 inch Roadrunner guidewire to the distal end of the [facility] guide catheter. With gentle manipulation, free access was obtained with the 0.035 inch Roadrunner guidewire. This was advanced to the distal vertical segment of the left internal carotid artery. The [place] JB 1 catheter was advanced distal to the occlusion of the left internal carotid artery. Aspiration of blood was obtained from the hub of the [facility] guide catheter. A gentle contrast injection demonstrated antegrade flow albeit slowly to the vertical segment without distal revascularization of the right internal carotid artery. At this time, the Roadrunner guidewire was removed. The 5 [place] JB 1 catheter was then exchanged for a 0.014 inch Softip Transend 300 cm exchange micro guidewire the tip of which was positioned in  the petrous segment of the left internal carotid artery. The distal tip of the exchange guidewire had a J configuration to avoid dissections or inducing spasm. The diagnostic JB 1 catheter was retrieved and removed. A control arteriogram performed through the 8 [place] FlowGate guide catheter in the left internal carotid artery origin now demonstrated a weak string sign angiographically in the proximal 1/3 of the left internal carotid artery. A 4 mm x 30 mm Viatrac 14 angioplasty balloon guide catheter was retrogradely flushed with heparinized saline infusion. Using the rapid exchange technique this was then advanced and positioned such that the proximal and the distal markers were adequate distance from the site of the acute occlusion of the left internal carotid artery proximally. At this time a slow inflation was then performed using a micro inflation syringe device via micro tubing. The balloon was inflated to 13 atmospheres achieving a 4.16 mm diameter. This was maintained for approximately 20 seconds. The balloon was then gently deflated and retrieved proximally ensuring no change in the position of the distal exchange micro guidewire in the petrous segment of the left internal carotid artery. A control arteriogram performed through the 8 [place] FlowGate guide catheter in the left internal carotid artery bulb now demonstrated antegrade flow into a decompressed left internal carotid artery and cervical segment and also distally. Control angiograms centered intracranially demonstrated opacification of the petrous, the cavernous segments and the proximal supraclinoid segments. There was no opacification of the left middle cerebral artery or the left anterior cerebral artery. Over the exchange micro guidewire, a Trevo ProVue microcatheter was then advanced to the horizontal petrous segment of the left internal carotid artery. The exchange micro guidewire was then retrieved and removed. Good aspiration obtained from  the hub of the microcatheter. Over a 0.014 inch Softip Synchro micro guidewire, the Trevo ProVue microcatheter was now advanced to the supraclinoid left ICA. The micro guidewire was then gently advanced into the occluded left middle cerebral artery to the M2 M3 region of the inferior division of the left middle cerebral artery followed by the microcatheter. The guidewire was removed. Slow aspiration obtained from the hub of the microcatheter. This was retrieved gently slightly until free aspiration was obtained. A very gentle constant injection through the microcatheter demonstrated slow  flow into the left MCA inferior division parietal branch. At this time, a 4 mm x 40 mm FR retrieval device was advanced in a coaxial manner and with constant heparinized saline infusion using biplane roadmap technique to the distal end of the microcatheter in the M2 M3 region of the inferior division of the left middle cerebral artery. The O ring on the delivery microcatheter was now loosened. With slight forward traction with the right hand on the delivery micro guidewire, with the left hand the delivery microcatheter was gently retrieved unsheathing the distal and the proximal portion of the retrieval device. The distal tip of the microcatheter was just proximal to the proximal portion of the retrieval device. A control arteriogram performed through the 8 [place] FlowGate guide catheter in the left internal carotid artery at the bulb demonstrates slow antegrade flow to the cranial skull base with achievement of a TICI 2b reperfusion. The balloon of the [facility] guide catheter was then inflated at the origin of the left internal carotid artery for proximal flow arrest. The proximal portion of the retrieval device was captured into the microcatheter. Thereafter the combination of the retrieval device in the microcatheter was gently retrieved as constant aspiration was applied at the hub of the [facility] guide catheter with a 60 mL  syringe. Aspiration was continued as the combination was retrieved and removed. The retrieval device was seen to have captured dark clot within its interstices. This was then retrieved and removed. Aspiration was continued as the balloon was deflated in the left internal carotid artery bulb region. Despite the deflated balloon, there continued to be lack of free aspiration. The FlowGate guide catheter was then retrieved gently more proximally until there was free aspiration of blood. Free back bleed was noted at the hub of the [facility] [place]. A control arteriogram performed through the [facility] guide catheter just proximal to the origin of the left internal carotid artery demonstrated near complete occlusion of the previously angioplastied site with trickle flow noted into and now the large left internal carotid artery proximally. However, slow flow was noted into the distal left internal carotid at the cranial skull base. Again axis through this near complete reocclusion was attempted again with the combination of the microcatheter and the micro guidewire without success. A 5 [place] JB 1 catheter was then advanced over a 0.035 inch Roadrunner guidewire to the distal end of the 8 [place] FlowGate guide catheter. Using gentle control of the 0.035 inch Roadrunner guidewire, access was obtained without difficulty through the re-occluded left internal carotid artery at the bulb with the JB 1 catheter being advanced distal to this. The 0.035 inch Roadrunner guidewire was removed. Good aspiration obtained from the hub of the JB 1 catheter. This in turn was then exchanged for a 0.014 inch Softip Transend 300 cm exchange micro guidewire to the distal end of which was now in the petrous portion of the left internal carotid artery. The 5 [place] JB 1 catheter was retrieved and removed. A gentle control arteriogram performed through the 8 [place] FlowGate guide catheter demonstrated very slow flow through the nearly reoccluded left  internal carotid artery at the bulb. This prompted the advancement of a 4 mm x 30 mm Viatrac 14 balloon angioplasty micro microcatheter which had been flushed retrogradely using the rapid exchange technique and positioned at the site of the re- occlusion of the proximal left internal carotid artery at the bulb. A slow control inflation was now performed using the inflation syringe device via  micro tubing. The balloon was inflated to 12 atmospheres achieving a diameter of over .014 atmospheres where it was maintained for approximately 20 seconds. The balloon was then gently deflated and retrieved and removed now with the distal end of the exchange wire maintained in the petrous segment. A control arteriogram performed through the 8 [place] FlowGate guide catheter in the left internal carotid artery at the bulb demonstrated now antegrade flow noted distally into the left internal carotid artery. Measurements were performed of the left internal carotid artery distally and proximal to the high-grade stenosis secondary to the thick heavily calcified plaque. A 6 mm-8 mm x 40 mm Xact stent delivery system was retrogradely prepped with heparinized saline infusion. Again using the rapid exchange technique, this was advanced to the distal end of the [facility] guide catheter. The stent delivery system was then advanced such that the distal and the proximal markers were optimally positioned. This stent was then deployed without difficulty. The delivery catheter was retrieved and removed as the distal end of the wire was maintained in the petrous segment. A control arteriogram performed through the 8 [place] FlowGate guide catheter now demonstrated plentiful distal flow of contrast through the stented segment and intracranially. Intracranially this demonstrated complete revascularization of the left middle cerebral artery distribution. The hypoplastic left anterior cerebral A1 segment was noted. A post stent placement balloon  angioplasty was then performed using a 5 mm x 30 mm Viatrac 14 balloon angioplasty catheter which was again advanced using the rapid exchange technique to the waist of the intra stent region. Again a slow inflation was performed to 13 atmospheres achieving a 5.34 mm diameter. This was done with a micro inflation syringe device via micro tubing. The balloon was left inflated for approximately 20 seconds. Upon deflation and proximal retrieval and removal, a control arteriogram performed through the [facility] guide catheter demonstrated excellent revascularization of the previously occluded left internal carotid artery at the bulb at the site of the heavily calcified plaque. There was spasm noted just distal to the device which responded to 20 mics of intra-arterial nitroglycerin. A control arteriogram performed at 15 and 30 minutes extra cranially and intracranially continued to demonstrate excellent flow through the stented segment and intracranially. Because of the mild filling defects noted at the site of the angioplasty of the left internal carotid artery, the patient was given additional intra-arterial Integrilin through the 8 [place] FlowGate guide catheter and another 6.3 mg were given with gradual clearance of any tiny filling defects at the site of the angioplasty within stent. Intracranially there continued to be excellent flow through the petrous, cavernous and supraclinoid segments. The left MCA continued to be widely patent. Only flash filling of the hypoplastic left anterior cerebral A1 segment was noted. There was no angiographic evidence of extravasation or mass-effect noted. The patient's hemodynamic status and neurological status remained stable throughout the procedure. No significant bradycardia was noted at the time of the angioplasty within the stented segment of the left internal carotid artery proximally. The patient had been pretreated. Following the final control arteriogram, the exchange micro  guidewire was then gently retrieved and removed. A final control arteriogram again continued to demonstrate excellent flow through the stented segment and also intracranially. The 8 [place] FlowGate guide catheter and the 8 [place] neurovascular sheath were then retrieved into the abdominal aorta and exchanged over a J-tip guidewire for a 9 [place] Pinnacle sheath. This in turn was then connected to continuous heparinized saline infusion. The patient's right groin  site continued be soft without evidence of a hematoma. The distal pulses remained 2+ in the dorsalis pedis bilaterally, and 1+ in the posterior tibials bilaterally postprocedure. The patient was then transferred to the CT scanner for postprocedural CT scan of the brain. IMPRESSION: Status post endovascular complete revascularization of occluded left middle cerebral artery with 1 pass with 4 mm x 40 mm Solitaire FR retrieval device achieving a TICI 3 reperfusion. 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 Patient loaded with the 650 mg of aspirin, and 300 mg of Plavix approximately 20 minutes prior to angioplasty of the left internal carotid artery proximally. PLAN: CT scan of the brain postprocedure. Electronically Signed   By: Julieanne Cotton M.D.   On: 02/15/2017 23:12   Ct Head Code Stroke W/o Cm  Result Date: 02/14/2017 CLINICAL DATA:  Code stroke. 71 y/o F; slurred speech and right-sided weakness. EXAM: CT ANGIOGRAPHY HEAD AND NECK TECHNIQUE: Multidetector CT imaging of the head and neck was performed using the standard protocol during bolus administration of intravenous contrast. Multiplanar CT image reconstructions and MIPs were obtained to evaluate the vascular anatomy. Carotid stenosis measurements (when  applicable) are obtained utilizing NASCET criteria, using the distal internal carotid diameter as the denominator. CONTRAST:  50 cc Isovue 370 COMPARISON:  04/26/2012 CT head FINDINGS: CT HEAD FINDINGS Brain: Focal loss of gray-white differentiation in the left insula (series 5, image 30) hypoattenuation within left posterior putamen. Stable background of moderate chronic microvascular ischemic changes the brain and mild brain parenchymal volume loss. No evidence for intracranial hemorrhage, hydrocephalus, or mass effect. Vascular: Left distal M1 hyperdensity (series 5, image 28). Skull: Normal. Negative for fracture or focal lesion. Sinuses/Orbits: No acute finding. Other: None. ASPECTS [facility] Stroke Program Early CT Score) - Ganglionic level infarction (caudate, lentiform nuclei, internal capsule, insula, M1-M3 cortex): 5 - Supraganglionic infarction (M4-M6 cortex): 3 Total score (0-10 with 10 being normal): 8 CTA NECK FINDINGS Aortic arch: Standard branching. Imaged portion shows no evidence of aneurysm or dissection. No significant stenosis of the major arch vessel origins. Moderate calcific atherosclerosis. Right carotid system: No evidence of dissection, stenosis (50% or greater) or occlusion. Mild calcific atherosclerosis of the bifurcation. 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 Patent left external carotid artery, likely via collateralization. Vertebral arteries: Right dominant. No evidence of dissection, stenosis (50% or greater) or occlusion. Skeleton: C4 through C7 anterior fusion and discectomy. Hardware appears intact without apparent hardware related complication. Multilevel prominent facet arthrosis. No high-grade bony canal stenosis. Other neck: Negative. Upper chest: Moderate centrilobular emphysema and mild biapical pleuroparenchymal scarring. Review of the MIP images confirms the above findings CTA HEAD FINDINGS Anterior  circulation: Occlusion of the internal carotid artery to the distal cavernous segment. Paraclinoid and terminal left ICA segments are patent as is the very proximal left M1 likely due to blood flow via a diminutive left posterior communicating artery. Left mid and distal M1 occlusion. Poor left MCA distribution collateralization. Left A1 occlusion. Patent bilateral A2 and symmetric ACA circulation. The left distal ACA is likely perfused via the anterior communicating artery. Patent right MCA. Posterior circulation: No significant stenosis, proximal occlusion, aneurysm, or vascular malformation. The segments of mild stenosis in the left V4 segment. Venous sinuses: As permitted  by contrast timing, patent. Anatomic variants: Bilateral posterior and anterior communicating artery are present. Delayed phase: No abnormal intracranial enhancement. Review of the MIP images confirms the above findings IMPRESSION: CT head: 1. Focal hypodensity in left insula and left posterior putamen compatible with infarction. 2. Hyperdensity and distal left M1. 3. ASPECTS is 8 CTA neck: 1. Occlusion of the left common and internal carotid artery from the arch to the distal cavernous segment. 2. Patent right common carotid system and vertebral arteries without dissection, aneurysm, or significant stenosis. 3. Moderate calcific atherosclerosis of the aortic arch 4. Centrilobular emphysema of lung apices. CTA head: 1. Patent paraclinoid and terminal segments of left internal carotid artery and minimal flow in proximal left M1 likely retrograde via a tiny left posterior communicating artery. 2. Occlusion of mid and distal left M1 with poor left MCA collateralization. 3. Left A1 occlusion. Bilateral A2 are patent with the left likely perfused via the anterior communicating artery. Symmetric distal ACA circulation. 4. Patent right MCA and posterior circulation. 5. No intracranial aneurysm identified. These results were called by telephone at the  time of interpretation on 02/14/2017 at 8:56 pm to Dr. Doug Sou ; [facility] , who verbally acknowledged these results. Electronically Signed   By: Mitzi Hansen M.D.   On: 02/14/2017 21:08   Ir Angio Intra Extracran Sel Com Carotid Innominate Uni R Mod Sed  Result Date: 02/16/2017 INDICATION: Stuttering speech progressing to expressive difficulty, mild confusion, and mild right-sided weakness. CT angiogram of the head and neck revealing occluded left common carotid artery, left internal carotid artery, supraclinoid left ICA and left middle cerebral artery. Hyperdense left MCA sign. EXAM: 1. EMERGENT LARGE VESSEL OCCLUSION THROMBOLYSIS (anterior CIRCULATION) COMPARISON:  CT angiogram of the head and neck of 02/14/2017. MEDICATIONS: Vancomycin 1 g IV was administered within 1 hour of the procedure. ANESTHESIA/SEDATION: General anesthesia. CONTRAST:  Isovue 300 approximately 120 mL. FLUOROSCOPY TIME:  Fluoroscopy Time: 96 minutes 54 seconds (2646 mGy). COMPLICATIONS: None immediate. TECHNIQUE: Following a full explanation of the procedure along with the potential associated complications, an informed witnessed consent was obtained from the patient's husband. The risks of intracranial hemorrhage of 10%, worsening neurological deficit, ventilator dependency, death and inability to revascularize were all reviewed in detail with the patient's husband. The patient was then put under general anesthesia by the Department of Anesthesiology at [facility]. The right groin was prepped and draped in the usual sterile fashion. Thereafter using modified Seldinger technique, transfemoral access into the right common femoral artery was obtained without difficulty. Over a 0.035 inch guidewire a 5 [place] Pinnacle sheath was inserted. Through this, and also over a 0.035 inch guidewire a 5 [place] JB 1 catheter was advanced to the aortic arch region and selectively positioned in the innominate artery, right  common carotid artery and the leftCommon carotid artery. FINDINGS: The innominate artery injection demonstrates a focal approximately 50% stenosis of the right subclavian artery at its origin. Distal to this wide patency is seen of the right vertebral artery with moderate tortuosity. The right vertebral artery is seen to opacify normally to the cranial skull base to opacify the right vertebrobasilar junction proximally, and also the right posterior-inferior cerebral artery. The right common carotid arteriogram demonstrates common carotid artery to have mild caliber irregularity in its mid 1/3. The right external carotid artery at its origin and branches is seen to opacify normally. The right internal carotid artery at the bulb to the cranial skull base opacifies widely. The petrous,  cavernous and supraclinoid segments demonstrate wide patency. The right middle cerebral artery and the right anterior cerebral artery opacify into the capillary and venous phases. Also demonstrated is prompt opacification via the anterior communicating artery of the left anterior cerebral A2 segment with filling defect noted in the left anterior cerebral A1 segment distally. The delayed arterial phase demonstrates excellent collaterals from the pericallosal, and the callosal marginal branches of the left anterior cerebral arteries retrogradely opacifying the frontal parietal, parietooccipital regions and the perisylvian branches of the perisylvian triangle. A prominent area of hyperperfusion is seen involving the left posterior frontal parietal and frontotemporal subcortical regions. The left common carotid arteriogram demonstrates extremely slow ascent of contrast to a heavily calcified plaque extending from the left common carotid artery distally into the left external and internal carotid arteries. No distal reconstitution is noted beyond the thick calcified plaque. PROCEDURE: ENDOVASCULAR REVASCULARIZATION OF ACUTELY OCCLUDED  SYMPTOMATIC LEFT COMMON CAROTID ARTERY DISTALLY EXTENDING INTO THE LEFT INTERNAL CAROTID ARTERY, THE LEFT INTERNAL CAROTID ARTERY SUPRACLINOID SEGMENT AND THE LEFT INTERNAL CAROTID ARTERY TERMINUS, AND OF THE LEFT MIDDLE CEREBRAL ARTERY PROXIMALLY WITH SOLITAIRE 4 MM X 40 MM FR RETRIEVAL DEVICE. The diagnostic JB 1 catheter in the left common carotid artery was exchanged over a 0.035 inch 300 cm Rosen exchange guidewire for a 55 cm 8 [place] Brite tip neurovascular sheath using biplane roadmap technique and constant fluoroscopic guidance. Good aspiration obtained from the hub of the 8 [place] neurovascular sheath which had its origin in the left common carotid artery. A gentle contrast injection demonstrated no evidence of spasms or dissections. This was then connected to continuous heparinized saline infusion. Over the [employer] guidewire, an 8 [place] 85 cm FlowGate balloon guide catheter which had been prepped with 50% contrast and 50% heparinized saline infusion was advanced and positioned just proximal to the heavily calcified plaque in the left common carotid bifurcation. The guidewire was removed. Good aspiration obtained from the hub of the [facility] guide catheter. Again a gentle contrast injection demonstrated no evidence of spasms, dissections or of intraluminal filling defects. Approximately 1.8 mg of intra-arterial Integrilin was infused through the [facility] guide catheter at the site of the proximal left internal carotid artery, at the site of the heavily calcified plaque. A rapid transit 2 tip microcatheter was then advanced over a 0.014 inch Softip Transend EX micro guidewire to the distal end of the [facility] guide catheter. The micro guidewire had a slight delay in the configuration to avoid dissections or inducing spasm. Thereafter, a torque device was then used to probe the occluded left internal carotid artery at the bulb with the micro guidewire with the help of the microcatheter. Multiple  attempts were made using different wires without success. The micro catheter micro guidewire combination was removed. At this time, a 5 [place] JB 1 catheter was advanced over a 0.035 inch Roadrunner guidewire to the distal end of the [facility] guide catheter. With gentle manipulation, free access was obtained with the 0.035 inch Roadrunner guidewire. This was advanced to the distal vertical segment of the left internal carotid artery. The [place] JB 1 catheter was advanced distal to the occlusion of the left internal carotid artery. Aspiration of blood was obtained from the hub of the [facility] guide catheter. A gentle contrast injection demonstrated antegrade flow albeit slowly to the vertical segment without distal revascularization of the right internal carotid artery. At this time, the Roadrunner guidewire was removed. The 5 [place] JB 1 catheter was then exchanged for  a 0.014 inch Softip Transend 300 cm exchange micro guidewire the tip of which was positioned in the petrous segment of the left internal carotid artery. The distal tip of the exchange guidewire had a J configuration to avoid dissections or inducing spasm. The diagnostic JB 1 catheter was retrieved and removed. A control arteriogram performed through the 8 [place] FlowGate guide catheter in the left internal carotid artery origin now demonstrated a weak string sign angiographically in the proximal 1/3 of the left internal carotid artery. A 4 mm x 30 mm Viatrac 14 angioplasty balloon guide catheter was retrogradely flushed with heparinized saline infusion. Using the rapid exchange technique this was then advanced and positioned such that the proximal and the distal markers were adequate distance from the site of the acute occlusion of the left internal carotid artery proximally. At this time a slow inflation was then performed using a micro inflation syringe device via micro tubing. The balloon was inflated to 13 atmospheres achieving a 4.16 mm diameter.  This was maintained for approximately 20 seconds. The balloon was then gently deflated and retrieved proximally ensuring no change in the position of the distal exchange micro guidewire in the petrous segment of the left internal carotid artery. A control arteriogram performed through the 8 [place] FlowGate guide catheter in the left internal carotid artery bulb now demonstrated antegrade flow into a decompressed left internal carotid artery and cervical segment and also distally. Control angiograms centered intracranially demonstrated opacification of the petrous, the cavernous segments and the proximal supraclinoid segments. There was no opacification of the left middle cerebral artery or the left anterior cerebral artery. Over the exchange micro guidewire, a Trevo ProVue microcatheter was then advanced to the horizontal petrous segment of the left internal carotid artery. The exchange micro guidewire was then retrieved and removed. Good aspiration obtained from the hub of the microcatheter. Over a 0.014 inch Softip Synchro micro guidewire, the Trevo ProVue microcatheter was now advanced to the supraclinoid left ICA. The micro guidewire was then gently advanced into the occluded left middle cerebral artery to the M2 M3 region of the inferior division of the left middle cerebral artery followed by the microcatheter. The guidewire was removed. Slow aspiration obtained from the hub of the microcatheter. This was retrieved gently slightly until free aspiration was obtained. A very gentle constant injection through the microcatheter demonstrated slow flow into the left MCA inferior division parietal branch. At this time, a 4 mm x 40 mm FR retrieval device was advanced in a coaxial manner and with constant heparinized saline infusion using biplane roadmap technique to the distal end of the microcatheter in the M2 M3 region of the inferior division of the left middle cerebral artery. The O ring on the delivery  microcatheter was now loosened. With slight forward traction with the right hand on the delivery micro guidewire, with the left hand the delivery microcatheter was gently retrieved unsheathing the distal and the proximal portion of the retrieval device. The distal tip of the microcatheter was just proximal to the proximal portion of the retrieval device. A control arteriogram performed through the 8 [place] FlowGate guide catheter in the left internal carotid artery at the bulb demonstrates slow antegrade flow to the cranial skull base with achievement of a TICI 2b reperfusion. The balloon of the [employer] guide catheter was then inflated at the origin of the left internal carotid artery for proximal flow arrest. The proximal portion of the retrieval device was captured into the microcatheter. Thereafter the  combination of the retrieval device in the microcatheter was gently retrieved as constant aspiration was applied at the hub of the [facility] guide catheter with a 60 mL syringe. Aspiration was continued as the combination was retrieved and removed. The retrieval device was seen to have captured dark clot within its interstices. This was then retrieved and removed. Aspiration was continued as the balloon was deflated in the left internal carotid artery bulb region. Despite the deflated balloon, there continued to be lack of free aspiration. The FlowGate guide catheter was then retrieved gently more proximally until there was free aspiration of blood. Free back bleed was noted at the hub of the Tuohy [place]. A control arteriogram performed through the [facility] guide catheter just proximal to the origin of the left internal carotid artery demonstrated near complete occlusion of the previously angioplastied site with trickle flow noted into and now the large left internal carotid artery proximally. However, slow flow was noted into the distal left internal carotid at the cranial skull base. Again axis through this near  complete reocclusion was attempted again with the combination of the microcatheter and the micro guidewire without success. A 5 [place] JB 1 catheter was then advanced over a 0.035 inch Roadrunner guidewire to the distal end of the 8 [place] FlowGate guide catheter. Using gentle control of the 0.035 inch Roadrunner guidewire, access was obtained without difficulty through the re-occluded left internal carotid artery at the bulb with the JB 1 catheter being advanced distal to this. The 0.035 inch Roadrunner guidewire was removed. Good aspiration obtained from the hub of the JB 1 catheter. This in turn was then exchanged for a 0.014 inch Softip Transend 300 cm exchange micro guidewire to the distal end of which was now in the petrous portion of the left internal carotid artery. The 5 [place] JB 1 catheter was retrieved and removed. A gentle control arteriogram performed through the 8 [place] FlowGate guide catheter demonstrated very slow flow through the nearly reoccluded left internal carotid artery at the bulb. This prompted the advancement of a 4 mm x 30 mm Viatrac 14 balloon angioplasty micro microcatheter which had been flushed retrogradely using the rapid exchange technique and positioned at the site of the re- occlusion of the proximal left internal carotid artery at the bulb. A slow control inflation was now performed using the inflation syringe device via micro tubing. The balloon was inflated to 12 atmospheres achieving a diameter of over .014 atmospheres where it was maintained for approximately 20 seconds. The balloon was then gently deflated and retrieved and removed now with the distal end of the exchange wire maintained in the petrous segment. A control arteriogram performed through the 8 [place] FlowGate guide catheter in the left internal carotid artery at the bulb demonstrated now antegrade flow noted distally into the left internal carotid artery. Measurements were performed of the left internal carotid  artery distally and proximal to the high-grade stenosis secondary to the thick heavily calcified plaque. A 6 mm-8 mm x 40 mm Xact stent delivery system was retrogradely prepped with heparinized saline infusion. Again using the rapid exchange technique, this was advanced to the distal end of the [facility] guide catheter. The stent delivery system was then advanced such that the distal and the proximal markers were optimally positioned. This stent was then deployed without difficulty. The delivery catheter was retrieved and removed as the distal end of the wire was maintained in the petrous segment. A control arteriogram performed through the 8 [place] FlowGate  guide catheter now demonstrated plentiful distal flow of contrast through the stented segment and intracranially. Intracranially this demonstrated complete revascularization of the left middle cerebral artery distribution. The hypoplastic left anterior cerebral A1 segment was noted. A post stent placement balloon angioplasty was then performed using a 5 mm x 30 mm Viatrac 14 balloon angioplasty catheter which was again advanced using the rapid exchange technique to the waist of the intra stent region. Again a slow inflation was performed to 13 atmospheres achieving a 5.34 mm diameter. This was done with a micro inflation syringe device via micro tubing. The balloon was left inflated for approximately 20 seconds. Upon deflation and proximal retrieval and removal, a control arteriogram performed through the [facility] guide catheter demonstrated excellent revascularization of the previously occluded left internal carotid artery at the bulb at the site of the heavily calcified plaque. There was spasm noted just distal to the device which responded to 20 mics of intra-arterial nitroglycerin. A control arteriogram performed at 15 and 30 minutes extra cranially and intracranially continued to demonstrate excellent flow through the stented segment and intracranially.  Because of the mild filling defects noted at the site of the angioplasty of the left internal carotid artery, the patient was given additional intra-arterial Integrilin through the 8 [place] FlowGate guide catheter and another 6.3 mg were given with gradual clearance of any tiny filling defects at the site of the angioplasty within stent. Intracranially there continued to be excellent flow through the petrous, cavernous and supraclinoid segments. The left MCA continued to be widely patent. Only flash filling of the hypoplastic left anterior cerebral A1 segment was noted. There was no angiographic evidence of extravasation or mass-effect noted. The patient's hemodynamic status and neurological status remained stable throughout the procedure. No significant bradycardia was noted at the time of the angioplasty within the stented segment of the left internal carotid artery proximally. The patient had been pretreated. Following the final control arteriogram, the exchange micro guidewire was then gently retrieved and removed. A final control arteriogram again continued to demonstrate excellent flow through the stented segment and also intracranially. The 8 [place] FlowGate guide catheter and the 8 [place] neurovascular sheath were then retrieved into the abdominal aorta and exchanged over a J-tip guidewire for a 9 [place] Pinnacle sheath. This in turn was then connected to continuous heparinized saline infusion. The patient's right groin site continued be soft without evidence of a hematoma. The distal pulses remained 2+ in the dorsalis pedis bilaterally, and 1+ in the posterior tibials bilaterally postprocedure. The patient was then transferred to the CT scanner for postprocedural CT scan of the brain. IMPRESSION: Status post endovascular complete revascularization of occluded left middle cerebral artery with 1 pass with 4 mm x 40 mm Solitaire FR retrieval device achieving a TICI 3 reperfusion. 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 Patient loaded with the 650 mg of aspirin, and 300 mg of Plavix approximately 20 minutes prior to angioplasty of the left internal carotid artery proximally. PLAN: CT scan of the brain postprocedure. Electronically Signed   By: Julieanne Cotton M.D.   On: 02/15/2017 23:12   Ir Angio Vertebral Sel Subclavian Innominate Uni R Mod Sed  Result Date: 02/16/2017 INDICATION: Stuttering speech progressing to expressive difficulty, mild confusion, and mild right-sided weakness. CT angiogram of the  head and neck revealing occluded left common carotid artery, left internal carotid artery, supraclinoid left ICA and left middle cerebral artery. Hyperdense left MCA sign. EXAM: 1. EMERGENT LARGE VESSEL OCCLUSION THROMBOLYSIS (anterior CIRCULATION) COMPARISON:  CT angiogram of the head and neck of 02/14/2017. MEDICATIONS: Vancomycin 1 g IV was administered within 1 hour of the procedure. ANESTHESIA/SEDATION: General anesthesia. CONTRAST:  Isovue 300 approximately 120 mL. FLUOROSCOPY TIME:  Fluoroscopy Time: 96 minutes 54 seconds (2646 mGy). COMPLICATIONS: None immediate. TECHNIQUE: Following a full explanation of the procedure along with the potential associated complications, an informed witnessed consent was obtained from the patient's husband. The risks of intracranial hemorrhage of 10%, worsening neurological deficit, ventilator dependency, death and inability to revascularize were all reviewed in detail with the patient's husband. The patient was then put under general anesthesia by the Department of Anesthesiology at [facility]. The right groin was prepped and draped in the usual sterile fashion. Thereafter using modified Seldinger technique, transfemoral access  into the right common femoral artery was obtained without difficulty. Over a 0.035 inch guidewire a 5 [place] Pinnacle sheath was inserted. Through this, and also over a 0.035 inch guidewire a 5 [place] JB 1 catheter was advanced to the aortic arch region and selectively positioned in the innominate artery, right common carotid artery and the leftCommon carotid artery. FINDINGS: The innominate artery injection demonstrates a focal approximately 50% stenosis of the right subclavian artery at its origin. Distal to this wide patency is seen of the right vertebral artery with moderate tortuosity. The right vertebral artery is seen to opacify normally to the cranial skull base to opacify the right vertebrobasilar junction proximally, and also the right posterior-inferior cerebral artery. The right common carotid arteriogram demonstrates common carotid artery to have mild caliber irregularity in its mid 1/3. The right external carotid artery at its origin and branches is seen to opacify normally. The right internal carotid artery at the bulb to the cranial skull base opacifies widely. The petrous, cavernous and supraclinoid segments demonstrate wide patency. The right middle cerebral artery and the right anterior cerebral artery opacify into the capillary and venous phases. Also demonstrated is prompt opacification via the anterior communicating artery of the left anterior cerebral A2 segment with filling defect noted in the left anterior cerebral A1 segment distally. The delayed arterial phase demonstrates excellent collaterals from the pericallosal, and the callosal marginal branches of the left anterior cerebral arteries retrogradely opacifying the frontal parietal, parietooccipital regions and the perisylvian branches of the perisylvian triangle. A prominent area of hyperperfusion is seen involving the left posterior frontal parietal and frontotemporal subcortical regions. The left common carotid arteriogram  demonstrates extremely slow ascent of contrast to a heavily calcified plaque extending from the left common carotid artery distally into the left external and internal carotid arteries. No distal reconstitution is noted beyond the thick calcified plaque. PROCEDURE: ENDOVASCULAR REVASCULARIZATION OF ACUTELY OCCLUDED SYMPTOMATIC LEFT COMMON CAROTID ARTERY DISTALLY EXTENDING INTO THE LEFT INTERNAL CAROTID ARTERY, THE LEFT INTERNAL CAROTID ARTERY SUPRACLINOID SEGMENT AND THE LEFT INTERNAL CAROTID ARTERY TERMINUS, AND OF THE LEFT MIDDLE CEREBRAL ARTERY PROXIMALLY WITH SOLITAIRE 4 MM X 40 MM FR RETRIEVAL DEVICE. The diagnostic JB 1 catheter in the left common carotid artery was exchanged over a 0.035 inch 300 cm Rosen exchange guidewire for a 55 cm 8 French Brite tip neurovascular sheath using biplane roadmap technique and constant fluoroscopic guidance. Good aspiration obtained from the hub of the 8 [place] neurovascular sheath which had its origin in the  left common carotid artery. A gentle contrast injection demonstrated no evidence of spasms or dissections. This was then connected to continuous heparinized saline infusion. Over the [employer] guidewire, an 8 [place] 85 cm FlowGate balloon guide catheter which had been prepped with 50% contrast and 50% heparinized saline infusion was advanced and positioned just proximal to the heavily calcified plaque in the left common carotid bifurcation. The guidewire was removed. Good aspiration obtained from the hub of the [place] guide catheter. Again a gentle contrast injection demonstrated no evidence of spasms, dissections or of intraluminal filling defects. Approximately 1.8 mg of intra-arterial Integrilin was infused through the [facility] guide catheter at the site of the proximal left internal carotid artery, at the site of the heavily calcified plaque. A rapid transit 2 tip microcatheter was then advanced over a 0.014 inch Softip Transend EX micro guidewire to the  distal end of the [facility] guide catheter. The micro guidewire had a slight delay in the configuration to avoid dissections or inducing spasm. Thereafter, a torque device was then used to probe the occluded left internal carotid artery at the bulb with the micro guidewire with the help of the microcatheter. Multiple attempts were made using different wires without success. The micro catheter micro guidewire combination was removed. At this time, a 5 [place] JB 1 catheter was advanced over a 0.035 inch Roadrunner guidewire to the distal end of the [facility] guide catheter. With gentle manipulation, free access was obtained with the 0.035 inch Roadrunner guidewire. This was advanced to the distal vertical segment of the left internal carotid artery. The [place] JB 1 catheter was advanced distal to the occlusion of the left internal carotid artery. Aspiration of blood was obtained from the hub of the [facility] guide catheter. A gentle contrast injection demonstrated antegrade flow albeit slowly to the vertical segment without distal revascularization of the right internal carotid artery. At this time, the Roadrunner guidewire was removed. The 5 [place] JB 1 catheter was then exchanged for a 0.014 inch Softip Transend 300 cm exchange micro guidewire the tip of which was positioned in the petrous segment of the left internal carotid artery. The distal tip of the exchange guidewire had a J configuration to avoid dissections or inducing spasm. The diagnostic JB 1 catheter was retrieved and removed. A control arteriogram performed through the 8 [place] FlowGate guide catheter in the left internal carotid artery origin now demonstrated a weak string sign angiographically in the proximal 1/3 of the left internal carotid artery. A 4 mm x 30 mm Viatrac 14 angioplasty balloon guide catheter was retrogradely flushed with heparinized saline infusion. Using the rapid exchange technique this was then advanced and positioned such that the  proximal and the distal markers were adequate distance from the site of the acute occlusion of the left internal carotid artery proximally. At this time a slow inflation was then performed using a micro inflation syringe device via micro tubing. The balloon was inflated to 13 atmospheres achieving a 4.16 mm diameter. This was maintained for approximately 20 seconds. The balloon was then gently deflated and retrieved proximally ensuring no change in the position of the distal exchange micro guidewire in the petrous segment of the left internal carotid artery. A control arteriogram performed through the 8 [place] FlowGate guide catheter in the left internal carotid artery bulb now demonstrated antegrade flow into a decompressed left internal carotid artery and cervical segment and also distally. Control angiograms centered intracranially demonstrated opacification of the petrous, the cavernous segments and  the proximal supraclinoid segments. There was no opacification of the left middle cerebral artery or the left anterior cerebral artery. Over the exchange micro guidewire, a Trevo ProVue microcatheter was then advanced to the horizontal petrous segment of the left internal carotid artery. The exchange micro guidewire was then retrieved and removed. Good aspiration obtained from the hub of the microcatheter. Over a 0.014 inch Softip Synchro micro guidewire, the Trevo ProVue microcatheter was now advanced to the supraclinoid left ICA. The micro guidewire was then gently advanced into the occluded left middle cerebral artery to the M2 M3 region of the inferior division of the left middle cerebral artery followed by the microcatheter. The guidewire was removed. Slow aspiration obtained from the hub of the microcatheter. This was retrieved gently slightly until free aspiration was obtained. A very gentle constant injection through the microcatheter demonstrated slow flow into the left MCA inferior division parietal branch.  At this time, a 4 mm x 40 mm FR retrieval device was advanced in a coaxial manner and with constant heparinized saline infusion using biplane roadmap technique to the distal end of the microcatheter in the M2 M3 region of the inferior division of the left middle cerebral artery. The O ring on the delivery microcatheter was now loosened. With slight forward traction with the right hand on the delivery micro guidewire, with the left hand the delivery microcatheter was gently retrieved unsheathing the distal and the proximal portion of the retrieval device. The distal tip of the microcatheter was just proximal to the proximal portion of the retrieval device. A control arteriogram performed through the 8 [place] FlowGate guide catheter in the left internal carotid artery at the bulb demonstrates slow antegrade flow to the cranial skull base with achievement of a TICI 2b reperfusion. The balloon of the [facility] guide catheter was then inflated at the origin of the left internal carotid artery for proximal flow arrest. The proximal portion of the retrieval device was captured into the microcatheter. Thereafter the combination of the retrieval device in the microcatheter was gently retrieved as constant aspiration was applied at the hub of the [facility] guide catheter with a 60 mL syringe. Aspiration was continued as the combination was retrieved and removed. The retrieval device was seen to have captured dark clot within its interstices. This was then retrieved and removed. Aspiration was continued as the balloon was deflated in the left internal carotid artery bulb region. Despite the deflated balloon, there continued to be lack of free aspiration. The FlowGate guide catheter was then retrieved gently more proximally until there was free aspiration of blood. Free back bleed was noted at the hub of the Tuohy [place]. A control arteriogram performed through the [facility] guide catheter just proximal to the origin of the left  internal carotid artery demonstrated near complete occlusion of the previously angioplastied site with trickle flow noted into and now the large left internal carotid artery proximally. However, slow flow was noted into the distal left internal carotid at the cranial skull base. Again axis through this near complete reocclusion was attempted again with the combination of the microcatheter and the micro guidewire without success. A 5 [place] JB 1 catheter was then advanced over a 0.035 inch Roadrunner guidewire to the distal end of the 8 [place] FlowGate guide catheter. Using gentle control of the 0.035 inch Roadrunner guidewire, access was obtained without difficulty through the re-occluded left internal carotid artery at the bulb with the JB 1 catheter being advanced distal to this. The 0.035  inch Roadrunner guidewire was removed. Good aspiration obtained from the hub of the JB 1 catheter. This in turn was then exchanged for a 0.014 inch Softip Transend 300 cm exchange micro guidewire to the distal end of which was now in the petrous portion of the left internal carotid artery. The 5 [place] JB 1 catheter was retrieved and removed. A gentle control arteriogram performed through the 8 [place] FlowGate guide catheter demonstrated very slow flow through the nearly reoccluded left internal carotid artery at the bulb. This prompted the advancement of a 4 mm x 30 mm Viatrac 14 balloon angioplasty micro microcatheter which had been flushed retrogradely using the rapid exchange technique and positioned at the site of the re- occlusion of the proximal left internal carotid artery at the bulb. A slow control inflation was now performed using the inflation syringe device via micro tubing. The balloon was inflated to 12 atmospheres achieving a diameter of over .014 atmospheres where it was maintained for approximately 20 seconds. The balloon was then gently deflated and retrieved and removed now with the distal end of the exchange  wire maintained in the petrous segment. A control arteriogram performed through the 8 [place] FlowGate guide catheter in the left internal carotid artery at the bulb demonstrated now antegrade flow noted distally into the left internal carotid artery. Measurements were performed of the left internal carotid artery distally and proximal to the high-grade stenosis secondary to the thick heavily calcified plaque. A 6 mm-8 mm x 40 mm Xact stent delivery system was retrogradely prepped with heparinized saline infusion. Again using the rapid exchange technique, this was advanced to the distal end of the [facility] guide catheter. The stent delivery system was then advanced such that the distal and the proximal markers were optimally positioned. This stent was then deployed without difficulty. The delivery catheter was retrieved and removed as the distal end of the wire was maintained in the petrous segment. A control arteriogram performed through the 8 [place] FlowGate guide catheter now demonstrated plentiful distal flow of contrast through the stented segment and intracranially. Intracranially this demonstrated complete revascularization of the left middle cerebral artery distribution. The hypoplastic left anterior cerebral A1 segment was noted. A post stent placement balloon angioplasty was then performed using a 5 mm x 30 mm Viatrac 14 balloon angioplasty catheter which was again advanced using the rapid exchange technique to the waist of the intra stent region. Again a slow inflation was performed to 13 atmospheres achieving a 5.34 mm diameter. This was done with a micro inflation syringe device via micro tubing. The balloon was left inflated for approximately 20 seconds. Upon deflation and proximal retrieval and removal, a control arteriogram performed through the [facility] guide catheter demonstrated excellent revascularization of the previously occluded left internal carotid artery at the bulb at the site of the  heavily calcified plaque. There was spasm noted just distal to the device which responded to 20 mics of intra-arterial nitroglycerin. A control arteriogram performed at 15 and 30 minutes extra cranially and intracranially continued to demonstrate excellent flow through the stented segment and intracranially. Because of the mild filling defects noted at the site of the angioplasty of the left internal carotid artery, the patient was given additional intra-arterial Integrilin through the 8 [place] FlowGate guide catheter and another 6.3 mg were given with gradual clearance of any tiny filling defects at the site of the angioplasty within stent. Intracranially there continued to be excellent flow through the petrous, cavernous and supraclinoid segments. The  left MCA continued to be widely patent. Only flash filling of the hypoplastic left anterior cerebral A1 segment was noted. There was no angiographic evidence of extravasation or mass-effect noted. The patient's hemodynamic status and neurological status remained stable throughout the procedure. No significant bradycardia was noted at the time of the angioplasty within the stented segment of the left internal carotid artery proximally. The patient had been pretreated. Following the final control arteriogram, the exchange micro guidewire was then gently retrieved and removed. A final control arteriogram again continued to demonstrate excellent flow through the stented segment and also intracranially. The 8 [place] FlowGate guide catheter and the 8 [place] neurovascular sheath were then retrieved into the abdominal aorta and exchanged over a J-tip guidewire for a 9 [place] Pinnacle sheath. This in turn was then connected to continuous heparinized saline infusion. The patient's right groin site continued be soft without evidence of a hematoma. The distal pulses remained 2+ in the dorsalis pedis bilaterally, and 1+ in the posterior tibials bilaterally postprocedure. The  patient was then transferred to the CT scanner for postprocedural CT scan of the brain. IMPRESSION: Status post endovascular complete revascularization of occluded left middle cerebral artery with 1 pass with 4 mm x 40 mm Solitaire FR retrieval device achieving a TICI 3 reperfusion. 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 Patient loaded with the 650 mg of aspirin, and 300 mg of Plavix approximately 20 minutes prior to angioplasty of the left internal carotid artery proximally. PLAN: CT scan of the brain postprocedure. Electronically Signed   By: Julieanne Cotton M.D.   On: 02/15/2017 23:12    Labs:  CBC:  Recent Labs  02/14/17 2030 02/14/17 2034 02/15/17 0558 02/16/17 0830 02/17/17 0232  WBC 7.6  --  10.6* 10.0 10.5  HGB 12.2 12.6 9.8* 9.2* 10.4*  HCT 37.3 37.0 29.9* 29.2* 33.3*  PLT 203  --  218 162 156    COAGS:  Recent Labs  02/14/17 2030  INR 0.94  APTT 25    BMP:  Recent Labs  02/14/17 2030 02/14/17 2034 02/16/17 0830 02/17/17 0232  NA 138 139 141 141  K 4.0 4.0 4.3 3.3*  CL 107 107 115* 112*  CO2 20*  --  21* 21*  GLUCOSE 96 94 114* 107*  BUN 23* 26* 25* 17  CALCIUM 9.1  --  8.2* 8.5*  CREATININE 0.84 1.00 0.65 0.62  GFRNONAA >60  --  >60 >60  GFRAA >60  --  >60 >60    LIVER FUNCTION TESTS:  Recent Labs  02/14/17 2030  BILITOT 0.4  AST 25  ALT 20  ALKPHOS 58  PROT 6.7  ALBUMIN 4.0    Assessment and Plan: 1. L MCA revasc and L ICA angioplasty/stent 7/17  Patient still with minimal movement of right side of body. Pt was not responsive to plavix, so she has been switched to Brilinta which is being crushed and given to her right now.  She has an MBS ordered this am to further assess her swallow function.   She  will need to follow up with Dr. Corliss Skains a couple of weeks after discharge from the hospital. Will defer further care at this time to neurology.  Electronically Signed: Letha Cape 02/17/2017, 9:45 AM   I spent a total of 15 Minutes  at the the patient's bedside AND on the patient's hospital floor or unit, greater than 50% of which was counseling/coordinating care for CVA

## 2017-02-17 NOTE — Progress Notes (Signed)
RN attempted to give patient all oral meds. Patient spit out first and second medications and refused to take any further medications, even when crushed in applesauce. Patient's husband and RN encouraged pt but pt still refused.   13:30 RN attempted to offer med again but patient still refused  Will continue to assess. MD paged to make aware.

## 2017-02-17 NOTE — Progress Notes (Signed)
STROKE TEAM PROGRESS NOTE   SUBJECTIVE (INTERVAL HISTORY) Her husband is at the bedside. The pt was extubated yesterday and tolerating well. However, pt had afib RVR overnight and put on amiodarone and esmolol drip. Currently HR in control.     OBJECTIVE Temp:  [98.1 F (36.7 C)-99.5 F (37.5 C)] 99 F (37.2 C) (07/19 1108) Pulse Rate:  [34-201] 73 (07/19 1500) Cardiac Rhythm: Normal sinus rhythm (07/19 0800) Resp:  [14-41] 30 (07/19 1500) BP: (90-175)/(43-142) 122/67 (07/19 1500) SpO2:  [87 %-100 %] 98 % (07/19 1500) Weight:  [131 lb 6.3 oz (59.6 kg)] 131 lb 6.3 oz (59.6 kg) (07/18 2230)  CBC:  Recent Labs Lab 02/14/17 2030  02/15/17 0558 02/16/17 0830 02/17/17 0232  WBC 7.6  --  10.6* 10.0 10.5  NEUTROABS 4.4  --  8.9*  --   --   HGB 12.2  < > 9.8* 9.2* 10.4*  HCT 37.3  < > 29.9* 29.2* 33.3*  MCV 96.6  --  97.1 100.3* 98.8  PLT 203  --  218 162 156  < > = values in this interval not displayed.  Basic Metabolic Panel:   Recent Labs Lab 02/16/17 0830 02/17/17 0232  NA 141 141  K 4.3 3.3*  CL 115* 112*  CO2 21* 21*  GLUCOSE 114* 107*  BUN 25* 17  CREATININE 0.65 0.62  CALCIUM 8.2* 8.5*  MG 2.1  --     Lipid Panel:     Component Value Date/Time   CHOL 173 02/15/2017 0420   TRIG 145 02/15/2017 0420   HDL 70 02/15/2017 0420   CHOLHDL 2.5 02/15/2017 0420   VLDL 29 02/15/2017 0420   LDLCALC 74 02/15/2017 0420   HgbA1c:  Lab Results  Component Value Date   HGBA1C 5.2 02/15/2017   Urine Drug Screen:     Component Value Date/Time   LABOPIA NONE DETECTED 02/14/2017 2124   COCAINSCRNUR NONE DETECTED 02/14/2017 2124   LABBENZ NONE DETECTED 02/14/2017 2124   AMPHETMU NONE DETECTED 02/14/2017 2124   THCU NONE DETECTED 02/14/2017 2124   LABBARB NONE DETECTED 02/14/2017 2124    Alcohol Level     Component Value Date/Time   ETH 152 (H) 02/14/2017 2030    IMAGING I have personally reviewed the radiological images below and agree with the radiology  interpretations.  Ct Angio Head/Neck W and Wo Contrast 02/14/2017 IMPRESSION: CT head: 1. Focal hypodensity in left insula and left posterior putamen compatible with infarction. 2. Hyperdensity and distal left M1. 3. ASPECTS is 8 CTA neck: 1. Occlusion of the left common and internal carotid artery from the arch to the distal cavernous segment. 2. Patent right common carotid system and vertebral arteries without dissection, aneurysm, or significant stenosis. 3. Moderate calcific atherosclerosis of the aortic arch 4. Centrilobular emphysema of lung apices. CTA head: 1. Patent paraclinoid and terminal segments of left internal carotid artery and minimal flow in proximal left M1 likely retrograde via a tiny left posterior communicating artery. 2. Occlusion of mid and distal left M1 with poor left MCA collateralization. 3. Left A1 occlusion. Bilateral A2 are patent with the left likely perfused via the anterior communicating artery. Symmetric distal ACA circulation. 4. Patent right MCA and posterior circulation. 5. No intracranial aneurysm identified.   Ct Head Wo Contrast 02/15/2017 IMPRESSION: 1. Contrast staining versus blood products LEFT basal ganglia to LEFT temporal lobe. Recommend 6-12 hour follow-up CT. 2. Moderate to severe chronic small vessel ischemic disease. T  Mr Brain Wo Contrast  Mr Maxine Glenn Head/brain Wo Cm 02/15/2017 IMPRESSION: 1. Acute confluent infarct involving the left putamen, caudate body, and corona radiata. 2 tiny cortical infarcts in the left MCA distribution. 2. Extensive chronic white matter disease, some combination of patient's multiple sclerosis and chronic microvascular ischemia. 3. Continued good patency of recently re- cannulized left ICA and M1 segment. 4. Mild left M1 segment irregularity. Moderate left M2 inferior division stenosis. 5. Moderate to advanced stenosis of the left V4 segment.   Ct Cerebral Perfusion W Contrast 02/14/2017 IMPRESSION: 1. Left MCA distribution  perfusion anomaly. Ischemic penumbra of 33 cc calculated by mismatch volume. 2. 0 cc infarct core (CBF <30%) by perfusion. This does not included the infarct core volume of the ASPECT 8 infarct on non contrast CT of the head involving the part of the left insula and putamen as visible infarct on noncontrast CT can be pseudo normalized on perfusion.   Portable Chest Xray 02/15/2017 IMPRESSION: 1. ACDF of the lower cervical spine. 2. Satisfactory support line and tube positions. 3. Atelectasis and/or scarring at the left lung base. 4. Aortic atherosclerosis.  Ct Head Code Stroke W/o Cm 02/14/2017 IMPRESSION: CT head: 1. Focal hypodensity in left insula and left posterior putamen compatible with infarction. 2. Hyperdensity and distal left M1. 3. ASPECTS is 8 CTA neck: 1. Occlusion of the left common and internal carotid artery from the arch to the distal cavernous segment. 2. Patent right common carotid system and vertebral arteries without dissection, aneurysm, or significant stenosis. 3. Moderate calcific atherosclerosis of the aortic arch 4. Centrilobular emphysema of lung apices. CTA head: 1. Patent paraclinoid and terminal segments of left internal carotid artery and minimal flow in proximal left M1 likely retrograde via a tiny left posterior communicating artery. 2. Occlusion of mid and distal left M1 with poor left MCA collateralization. 3. Left A1 occlusion. Bilateral A2 are patent with the left likely perfused via the anterior communicating artery. Symmetric distal ACA circulation. 4. Patent right MCA and posterior circulation. 5. No intracranial aneurysm identified.    TTE - Left ventricle: The cavity size was normal. There was moderate   concentric hypertrophy. Systolic function was vigorous. The   estimated ejection fraction was in the range of 65% to 70%. Wall   motion was normal; there were no regional wall motion   abnormalities. Doppler parameters are consistent with abnormal   left  ventricular relaxation (grade 1 diastolic dysfunction).   Doppler parameters are consistent with elevated ventricular   end-diastolic filling pressure. - Aortic valve: Trileaflet; mildly thickened, mildly calcified   leaflets. There was mild stenosis. Mean gradient (S): 11 mm Hg.   Peak gradient (S): 17 mm Hg. Valve area (VTI): 1.45 cm^2. Valve   area (Vmax): 1.73 cm^2. Valve area (Vmean): 1.44 cm^2. - Mitral valve: Calcified annulus. Mildly thickened leaflets .   There was trivial regurgitation. - Left atrium: The atrium was normal in size. - Right ventricle: The cavity size was normal. Wall thickness was   normal. Systolic function was normal. - Right atrium: The atrium was normal in size. - Tricuspid valve: There was trivial regurgitation. - Pulmonary arteries: Systolic pressure was within the normal   range. - Inferior vena cava: The vessel was normal in size. - Pericardium, extracardiac: There was no pericardial effusion. Impressions: - No cardiac source of emboli was indentified.  LE venous doppler There is no evidence of deep or superficial vein thrombosis involving the right and left lower extremities. All visualized vessels appear patent and compressible.  Incidental findings are consistent with a Baker's Cyst measuring 1.2 cm high by 3.5 cm wide by 3.1 cm long on the right.   PHYSICAL EXAM  Temp:  [98.1 F (36.7 C)-99.5 F (37.5 C)] 99 F (37.2 C) (07/19 1108) Pulse Rate:  [34-201] 73 (07/19 1500) Resp:  [14-41] 30 (07/19 1500) BP: (90-175)/(43-142) 122/67 (07/19 1500) SpO2:  [87 %-100 %] 98 % (07/19 1500) Weight:  [131 lb 6.3 oz (59.6 kg)] 131 lb 6.3 oz (59.6 kg) (07/18 2230)  General - Well nourished, well developed, intubated off sedation.  Ophthalmologic - Fundi not visualized due to noncooperation.  Cardiovascular - Regular rate and rhythm.  Neuro - intubated off sedation. Eyes open, follows simple commands centrally and peripherally. Left gaze preference,  but able to cross midline. Blinking to visual threat bilaterally. PERRL. Facial symmetry not able to assess due to ET tube. RUE 0/5, RLE 2/5 on pain stimulation. LUE at least 4/5, LLE at least 3/5. DTR 1+ and right positive babinski. Sensation, coordination and gait not tested.   ASSESSMENT/PLAN [name] is a 71 y.o. female with a  20 year history of MS, hyperlipidemia, and CAD with stent in 2000, who presented as a Code Stroke for right sided weakness and garbled speech. She did not receive IV t-PA due to arriving outside of the treatment window.   Stroke:  Acute MCA infarct due to left CCA and MCA occlusion, s/p mechanical thrombectomy and left ICA stenting in the setting of newly diagnosed afib  Resultant  Intubated, right hemiplegia  CT head: Contrast staining versus blood products LEFT basal ganglia to LEFT temporal lobe.   CTA head and neck: left CCA origin occlusion, A1 and distal M1 occlusion  MRI head: Acute confluent infarct involving the left putamen, caudate body, and corona radiata, as well as tiny cortical infarcts in the left MCA distribution.    MRA head: Mild left M1 segment irregularity, moderate left M2 inferior division stenosis.   2D Echo  EF 65-70%  LE venous doppler no PFO but baker's cyst  LDL 74  HgbA1c 5.2  lovenox for VTE prophylaxis DIET DYS 2 Room service appropriate? Yes; Fluid consistency: Thin  aspirin 81 mg daily prior to admission, now on aspirin 81 and brilinta 90 mg bid.  Patient counseled to be compliant with her antithrombotic medications  Ongoing aggressive stroke risk factor management  Therapy recommendations:  pending  Disposition:  Pending  PAF with RVR  new diagnosis likely the cause for stroke  On amiodarone drip and esmolol drip  Cardiology consulted   Continue DAPT for now and will need to start anticoagulation in 5 days (over the weekend).   Left CCA occlusion s/p thrombectomy  Likely due to undiagnosed afib  PTA  S/p left ICA stent  Hypotension  Improved  Off phenylephrine gtt Long-term BP goal normotensive  Hyperlipidemia  Home meds: none  LDL 74, goal < 70  Add atorvastatin 40mg  PO daily  Continue statin at discharge  Dysphagia   dys 2 diet with thin liquid  Speech is following  Other Stroke Risk Factors  Advanced age  ETOH use, advised to drink no more than 1 drink(s) a day  Coronary artery disease  Other Active Problems  Sinus bradycardia - resolved  Multiple sclerosis: follows neurology in [place], was on Betaseron, no flare up, took off 11/2015 due to elevated LFT  Hospital day # 3  This patient is critically ill due to CCA occlusion, MCA occlusion, s/p mechanical thrombectomy  and ICA stent, afib RVR, dysphagia and at significant risk of neurological worsening, death form recurrent stroke, hemorrhagic conversion, seizure, cerebral edema, heart failure, aspiration pneumonia. This patient's care requires constant monitoring of vital signs, hemodynamics, respiratory and cardiac monitoring, review of multiple databases, neurological assessment, discussion with family, other specialists and medical decision making of high complexity. I spent 40 minutes of neurocritical care time in the care of this patient. I had long discussion with husband at bedside, updated pt current condition, treatment plan and potential prognosis. He expressed understanding and appreciation.   Marvel Plan, MD PhD Stroke Neurology 02/17/2017 3:22 PM   To contact Stroke Continuity provider, please refer to [email or web address]. After hours, contact General Neurology

## 2017-02-17 NOTE — Evaluation (Signed)
Physical Therapy Evaluation Patient Details Name: Kaitlin [name] MRN: [id number] DOB: 05-25-1946 Today's Date: 02/17/2017   History of Present Illness  Pt is a 71 y/o female with a PMH significant for multiple sclerosis. She presented to [facility] 7/17 with R-sided weakness and aphasia. CT revealed L MCA occlusion. She is now s/p R vertebral arteriogram with complete revascularization of occluded L MCA and stent assisted angioplasty of proximal L ICA occlusion. She was intubated 7/17-7/18.  Clinical Impression  Pt admitted with above diagnosis. Pt currently with functional limitations due to the deficits listed below (see PT Problem List). At the time of PT eval pt was able to tolerate transition to recliner chair with +2 assist for balance support and safety. Bed pad used for increased support under hips for transfer. Pt answering questions throughout with moderate accuracy. Pt's husband, Gabriel Rung entered after session began and provided home living and PLOF information. As pt was independent PTA feel CIR would be beneficial to maximize functional independence and decrease burden of care on family at eventual return home. Pt will benefit from skilled PT to increase their independence and safety with mobility to allow discharge to the venue listed below.       Follow Up Recommendations CIR;Supervision/Assistance - 24 hour    Equipment Recommendations   (TBD by next venue of care)    Recommendations for Other Services Rehab consult     Precautions / Restrictions Precautions Precautions: Fall Precaution Comments: R side inattention Restrictions Weight Bearing Restrictions: No      Mobility  Bed Mobility Overal bed mobility: Needs Assistance Bed Mobility: Supine to Sit     Supine to sit: Max assist;+2 for physical assistance;+2 for safety/equipment;HOB elevated     General bed mobility comments: +2 assist for all aspects of bed mobility. Bed pad used for assist. Psoterior support required to  maintain sitting balance.   Transfers Overall transfer level: Needs assistance Equipment used: 2 person hand held assist Transfers: Sit to/from [employer] Sit to Stand: Max assist;+2 physical assistance;+2 safety/equipment   Squat pivot transfers: Max assist;+2 physical assistance;+2 safety/equipment     General transfer comment: Bed pad used to assist in squat pivot bed>chair. R knee blocked for safety.   Ambulation/Gait             General Gait Details: Unable at this time  Stairs            Wheelchair Mobility    Modified Rankin (Stroke Patients Only) Modified Rankin (Stroke Patients Only) Pre-Morbid Rankin Score: No symptoms Modified Rankin: Severe disability     Balance Overall balance assessment: Needs assistance Sitting-balance support: Feet supported;No upper extremity supported Sitting balance-Leahy Scale: Zero Sitting balance - Comments: Max assist at times to maintain sitting balance Postural control: Posterior lean                                   Pertinent Vitals/Pain Pain Assessment: Faces Faces Pain Scale: Hurts little more Pain Location: General grimacing with movement Pain Descriptors / Indicators: Discomfort Pain Intervention(s): Limited activity within patient's tolerance;Monitored during session;Repositioned    Home Living Family/patient expects to be discharged to:: Inpatient rehab Living Arrangements: Spouse/significant other Available Help at Discharge: Family;Available 24 hours/day Type of Home: House Home Access: Stairs to enter Entrance Stairs-Rails: Right Entrance Stairs-Number of Steps: 4 Home Layout: Multi-level Home Equipment: Shower seat - built in      Prior Function Level  of Independence: Independent         Comments: Pt gardening the morning she was admitted     Hand Dominance   Dominant Hand: Left    Extremity/Trunk Assessment   Upper Extremity Assessment Upper  Extremity Assessment: RUE deficits/detail;Defer to OT evaluation    Lower Extremity Assessment Lower Extremity Assessment: RLE deficits/detail RLE Deficits / Details: Significant RLE weakness noted. Appeared that pt was attempting to move it but was not able to. Not able to participate in MMT or sensation testing.     Cervical / Trunk Assessment Cervical / Trunk Assessment: Other exceptions Cervical / Trunk Exceptions: forward head/rounded shoulder posture  Communication   Communication: Expressive difficulties  Cognition Arousal/Alertness: Awake/alert Behavior During Therapy: Flat affect Overall Cognitive Status: Impaired/Different from baseline Area of Impairment: Orientation;Attention;Memory;Following commands;Safety/judgement;Awareness;Problem solving                 Orientation Level: Disoriented to;Place;Time;Situation Current Attention Level: Sustained Memory: Decreased short-term memory;Decreased recall of precautions Following Commands: Follows one step commands inconsistently;Follows one step commands with increased time Safety/Judgement: Decreased awareness of safety;Decreased awareness of deficits Awareness: Intellectual Problem Solving: Slow processing;Decreased initiation;Difficulty sequencing;Requires verbal cues;Requires tactile cues        General Comments      Exercises     Assessment/Plan    PT Assessment Patient needs continued PT services  PT Problem List Decreased strength;Decreased range of motion;Decreased activity tolerance;Decreased balance;Decreased mobility;Decreased coordination;Decreased cognition;Decreased knowledge of use of DME;Decreased safety awareness;Decreased knowledge of precautions;Impaired tone       PT Treatment Interventions DME instruction;Gait training;Stair training;Functional mobility training;Therapeutic activities;Therapeutic exercise;Neuromuscular re-education;Patient/family education    PT Goals (Current goals can be  found in the Care Plan section)  Acute Rehab PT Goals Patient Stated Goal: Family goal is to return to PLOF PT Goal Formulation: With family Time For Goal Achievement: 03/03/17 Potential to Achieve Goals: Good    Frequency Min 3X/week   Barriers to discharge        Co-evaluation               AM-PAC PT "6 Clicks" Daily Activity  Outcome Measure Difficulty turning over in bed (including adjusting bedclothes, sheets and blankets)?: Total Difficulty moving from lying on back to sitting on the side of the bed? : Total Difficulty sitting down on and standing up from a chair with arms (e.g., wheelchair, bedside commode, etc,.)?: Total Help needed moving to and from a bed to chair (including a wheelchair)?: Total Help needed walking in hospital room?: Total Help needed climbing 3-5 steps with a railing? : Total 6 Click Score: 6    End of Session Equipment Utilized During Treatment: Gait belt;Oxygen Activity Tolerance: Patient limited by fatigue (weakness; cognition) Patient left: in chair;with call bell/phone within reach;with chair alarm set;with family/visitor present;with nursing/sitter in room Nurse Communication: Mobility status PT Visit Diagnosis: Unsteadiness on feet (R26.81);Hemiplegia and hemiparesis Hemiplegia - Right/Left: Right Hemiplegia - dominant/non-dominant: Non-dominant Hemiplegia - caused by: Cerebral infarction    Time: 1610-9604 PT Time Calculation (min) (ACUTE ONLY): 28 min   Charges:   PT Evaluation $PT Eval Moderate Complexity: 1 Procedure PT Treatments $Therapeutic Activity: 8-22 mins   PT G Codes:        Conni Slipper, PT, DPT Acute Rehabilitation Services Pager: [phone]   Marylynn Pearson 02/17/2017, 3:06 PM

## 2017-02-17 NOTE — Progress Notes (Signed)
OT Cancellation Note  Patient Details Name: Kaitlin [name] MRN: [id number] DOB: [birth year]   Cancelled Treatment:    Reason Eval/Treat Not Completed: Patient not medically ready (on bedrest)  [facility] [name], OT/L  [phone] 02/17/2017 02/17/2017, 7:08 AM

## 2017-02-17 NOTE — Consult Note (Signed)
Cardiology Consultation:   Patient ID: Kaitlin [name]; [id number]; 01-15-1946   Admit date: 02/14/2017 Date of Consult: 02/17/2017  Primary Care Provider: Prince Solian, MD Primary Cardiologist: Clovia Cuff, MD ([place]) Primary Electrophysiologist:  none   Patient Profile:   Kaitlin [name] is a 71 y.o. female with a hx of CAD, cardiac cath/PCI (Dr. Zadie Rhine, 4/12,01): 90% prox RCA tx with BX velocity stent, high cholesterol and multiple sclerosis.  The patient is being seen today for the evaluation of atrial fibrillation at the request of Dr. Erlinda Hong, J.   (neurology).  History of Present Illness:   Kaitlin [name] last saw her cardiologist in 2016 at [facility] for results of her echocardiogram. Her exercise stress echo demonstrated normal heart muscle function - no evidence of coronary ischemia. Her chest x-ray did not show any heart failure or infection.  Patient brought to the ER (7/16) with symptoms of stuttering and weakness in right hand that progressed into right sided weakness. The symptoms started at 11am. CTA brain shows ICA occlusion into M1 she underwent an endovascular procedure resulting in complete revascularization of the occluded Lt MCA. Troponin mildly elevated at 0.03. Started on Brilinta (7/18) by Dr. Tommie Raymond,   Today around 4 am the patient developed atrial fibrillation with RVR rates 140s, eLink Dr. Mortimer Fries started Amiodarone infusion. She was extubated today and esmolol was also started later in the morning for continued rates in 150s. She converted to sinus rhythm with rates at 60. The patient has had some agitation and has also intermittently been refusing medications and spitting them out.  EKG shows rapid atrial fibrillation from 2am-7am with high rates. Reviewing prior EKGs she had bradycardia rates 40-50s on 7/17.  Blood pressure 122/67, pulse 73, temperature 99 F (37.2 C), temperature source Oral, resp. rate (!) 30, height 5' 2"  (1.575 m), weight  131 lb 6.3 oz (59.6 kg), SpO2 98 %.   Past Medical History:  Diagnosis Date  . High cholesterol   . Multiple sclerosis ([place])     Past Surgical History:  Procedure Laterality Date  . CORONARY ANGIOPLASTY WITH STENT PLACEMENT    . IR ANGIO INTRA EXTRACRAN SEL COM CAROTID INNOMINATE UNI R MOD SED  02/15/2017  . IR ANGIO VERTEBRAL SEL SUBCLAVIAN INNOMINATE UNI R MOD SED  02/15/2017  . IR INTRAVSC STENT CERV CAROTID W/O EMB-PROT MOD SED INC ANGIO  02/15/2017  . IR PERCUTANEOUS ART THROMBECTOMY/INFUSION INTRACRANIAL INC DIAG ANGIO  02/15/2017  . NECK SURGERY    . RADIOLOGY WITH ANESTHESIA N/A 02/14/2017   Procedure: RADIOLOGY WITH ANESTHESIA;  Surgeon: Radiologist, Medication, MD;  Location: [facility];  Service: Radiology;  Laterality: N/A;  . WRIST SURGERY       Inpatient Medications: Scheduled Meds: . aspirin EC  81 mg Oral Q breakfast  . atorvastatin  40 mg Oral q1800  . calcium-vitamin D  1 tablet Oral Daily  . chlorhexidine gluconate (MEDLINE KIT)  15 mL Mouth Rinse BID  . enoxaparin (LOVENOX) injection  40 mg Subcutaneous Q24H  . fentaNYL (SUBLIMAZE) injection  50 mcg Intravenous Once  . FLUoxetine  10 mg Oral Daily  . mouth rinse  15 mL Mouth Rinse QID  . multivitamin  1 tablet Oral BID  . pantoprazole  40 mg Oral Daily  . potassium chloride  20 mEq Oral BID  . ticagrelor  90 mg Oral BID  . venlafaxine XR  75 mg Oral Daily   Continuous Infusions: . sodium chloride 50 mL/hr at 02/17/17 1700  .  amiodarone 30 mg/hr (02/17/17 1700)  . clevidipine    . esmolol 25.168 mcg/kg/min (02/17/17 1700)  . fentaNYL infusion INTRAVENOUS Stopped (02/16/17 0800)  . phenylephrine (NEO-SYNEPHRINE) Adult infusion Stopped (02/16/17 0800)   PRN Meds: acetaminophen **OR** acetaminophen (TYLENOL) oral liquid 160 mg/5 mL **OR** acetaminophen, fentaNYL, gabapentin, midazolam, senna-docusate  Allergies:    Allergies  Allergen Reactions  . Penicillins     Social History:   Social History    Social History  . Marital status: Married    Spouse name: N/A  . Number of children: N/A  . Years of education: N/A   Occupational History  . Not on file.   Social History Main Topics  . Smoking status: Never Smoker  . Smokeless tobacco: Never Used  . Alcohol use 1.8 oz/week    3 Glasses of wine per week  . Drug use: No  . Sexual activity: Not on file   Other Topics Concern  . Not on file   Social History Narrative  . No narrative on file      Family History:   Unable to assess family history, the patient is awake but answering questions inappropriately    ROS:  Please see the history of present illness.  Unable to obtain full ROS due to altered mental status     Physical Exam/Data:   Vitals:   02/17/17 1600 02/17/17 1603 02/17/17 1630 02/17/17 1700  BP: (!) 171/77  (!) 176/74 (!) 163/78  Pulse: 74  73 79  Resp: (!) 21  (!) 25 (!) 24  Temp:  99 F (37.2 C)    TempSrc:  Axillary    SpO2: 98%  97% 94%  Weight:      Height:        Intake/Output Summary (Last 24 hours) at 02/17/17 1722 Last data filed at 02/17/17 1700  Gross per 24 hour  Intake          2559.93 ml  Output              125 ml  Net          2434.93 ml   Filed Weights   02/14/17 2000 02/14/17 2100 02/16/17 2230  Weight: 130 lb 4.7 oz (59.1 kg) 124 lb (56.2 kg) 131 lb 6.3 oz (59.6 kg)   Body mass index is 24.03 kg/m.  General: Well developed, appears flushed in the face Head: Normocephalic, atraumatic, Neck:  JVD not elevated. Lungs: Breathing is unlabored. Heart: RRR with S1 S2. No murmurs, rubs, or gallops appreciated. Abdomen: Soft, non-tender, non-distended with normoactive bowel sounds. Extremities: No clubbing or cyanosis. No edema.  Neuro: Alert, not oriented. She has a stutter with marked right side arm and facial weakness. Psych:  Flat affect, has been agitated-- requiring sitter  EKG:  The EKG was personally reviewed and demonstrates atrial fibrillation with RVR, rates  130s  Relevant CV Studies: TTE  02/16/17   - Left ventricle: The cavity size was normal. There was moderate concentric hypertrophy. Systolic function was vigorous. The estimated ejection fraction was in the range of 65% to 70%. Wall motion was normal; there were no regional wall motion abnormalities. Doppler parameters are consistent with abnormal left ventricular relaxation (grade 1 diastolic dysfunction). Doppler parameters are consistent with elevated ventricular end-diastolic filling pressure. - Aortic valve: Trileaflet; mildly thickened, mildly calcified leaflets. There was mild stenosis. Mean gradient (S): 11 mm Hg. Peak gradient (S): 17 mm Hg. Valve area (VTI): 1.45 cm^2. Valve area (Vmax): 1.73 cm^2. Valve  area (Vmean): 1.44 cm^2. - Mitral valve: Calcified annulus. Mildly thickened leaflets . There was trivial regurgitation. - Left atrium: The atrium was normal in size. - Right ventricle: The cavity size was normal. Wall thickness was normal. Systolic function was normal. - Right atrium: The atrium was normal in size. - Tricuspid valve: There was trivial regurgitation. - Pulmonary arteries: Systolic pressure was within the normal range. - Inferior vena cava: The vessel was normal in size. - Pericardium, extracardiac: There was no pericardial effusion. Impressions: - No cardiac source of emboli was indentified.  Laboratory Data:  Chemistry  Recent Labs Lab 02/14/17 2030 02/14/17 2034 02/16/17 0830 02/17/17 0232  NA 138 139 141 141  K 4.0 4.0 4.3 3.3*  CL 107 107 115* 112*  CO2 20*  --  21* 21*  GLUCOSE 96 94 114* 107*  BUN 23* 26* 25* 17  CREATININE 0.84 1.00 0.65 0.62  CALCIUM 9.1  --  8.2* 8.5*  GFRNONAA >60  --  >60 >60  GFRAA >60  --  >60 >60  ANIONGAP 11  --  5 8     Recent Labs Lab 02/14/17 2030  PROT 6.7  ALBUMIN 4.0  AST 25  ALT 20  ALKPHOS 58  BILITOT 0.4   Hematology  Recent Labs Lab 02/15/17 0558  02/16/17 0830 02/17/17 0232  WBC 10.6* 10.0 10.5  RBC 3.08* 2.91* 3.37*  HGB 9.8* 9.2* 10.4*  HCT 29.9* 29.2* 33.3*  MCV 97.1 100.3* 98.8  MCH 31.8 31.6 30.9  MCHC 32.8 31.5 31.2  RDW 14.0 14.0 13.6  PLT 218 162 156   Cardiac Enzymes  Recent Labs Lab 02/15/17 0800 02/15/17 1458 02/15/17 2015  TROPONINI <0.03 0.03* 0.03*     Recent Labs Lab 02/14/17 2033  TROPIPOC 0.02    BNPNo results for input(s): BNP, PROBNP in the last 168 hours.  DDimer No results for input(s): DDIMER in the last 168 hours.  Radiology/Studies:  Ct Angio Head W Or Wo Contrast Result Date: 02/14/2017  IMPRESSION: CT head: 1. Focal hypodensity in left insula and left posterior putamen compatible with infarction. 2. Hyperdensity and distal left M1. 3. ASPECTS is 8 CTA neck: 1. Occlusion of the left common and internal carotid artery from the arch to the distal cavernous segment. 2. Patent right common carotid system and vertebral arteries without dissection, aneurysm, or significant stenosis. 3. Moderate calcific atherosclerosis of the aortic arch 4. Centrilobular emphysema of lung apices. CTA head: 1. Patent paraclinoid and terminal segments of left internal carotid artery and minimal flow in proximal left M1 likely retrograde via a tiny left posterior communicating artery. 2. Occlusion of mid and distal left M1 with poor left MCA collateralization. 3. Left A1 occlusion. Bilateral A2 are patent with the left likely perfused via the anterior communicating artery. Symmetric distal ACA circulation. 4. Patent right MCA and posterior circulation. 5. No intracranial aneurysm identified. These results were called by telephone at the time of interpretation on 02/14/2017 at 8:56 pm to Dr. Orlie Dakin ; [facility] , who verbally acknowledged these results. Electronically Signed   By: Kristine Garbe M.D.   On: 02/14/2017 21:08   Ct Head Wo Contrast Addendum Date: 02/15/2017   ADDENDUM REPORT: 02/15/2017 03:18  ADDENDUM: Acute findings discussed with and reconfirmed by Dr.Lindzen, Neurology on 02/15/2017 at 3:15 am. Electronically Signed   By: Elon Alas M.D.   On: 02/15/2017 03:18  Result Date: 02/15/2017 IMPRESSION: 1. Contrast staining versus blood products LEFT basal ganglia to LEFT  temporal lobe. Recommend 6-12 hour follow-up CT. 2. Moderate to severe chronic small vessel ischemic disease. These results will be called to the ordering clinician or representative by the Radiologist Assistant, and communication documented in the [facility] Dashboard. Electronically Signed: By: Elon Alas M.D. On: 02/15/2017 03:01   Ct Angio Neck W Or Wo Contrast Result Date: 02/14/2017 IMPRESSION: CT head: 1. Focal hypodensity in left insula and left posterior putamen compatible with infarction. 2. Hyperdensity and distal left M1. 3. ASPECTS is 8 CTA neck: 1. Occlusion of the left common and internal carotid artery from the arch to the distal cavernous segment. 2. Patent right common carotid system and vertebral arteries without dissection, aneurysm, or significant stenosis. 3. Moderate calcific atherosclerosis of the aortic arch 4. Centrilobular emphysema of lung apices. CTA head: 1. Patent paraclinoid and terminal segments of left internal carotid artery and minimal flow in proximal left M1 likely retrograde via a tiny left posterior communicating artery. 2. Occlusion of mid and distal left M1 with poor left MCA collateralization. 3. Left A1 occlusion. Bilateral A2 are patent with the left likely perfused via the anterior communicating artery. Symmetric distal ACA circulation. 4. Patent right MCA and posterior circulation. 5. No intracranial aneurysm identified. These results were called by telephone at the time of interpretation on 02/14/2017 at 8:56 pm to Dr. Orlie Dakin ; [facility] , who verbally acknowledged these results. Electronically Signed   By: Kristine Garbe M.D.   On: 02/14/2017 21:08   Mr  Brain Wo Contrast Result Date: 02/15/2017 IMPRESSION: 1. Acute confluent infarct involving the left putamen, caudate body, and corona radiata. 2 tiny cortical infarcts in the left MCA distribution. 2. Extensive chronic white matter disease, some combination of patient's multiple sclerosis and chronic microvascular ischemia. 3. Continued good patency of recently re- cannulized left ICA and M1 segment. 4. Mild left M1 segment irregularity. Moderate left M2 inferior division stenosis. 5. Moderate to advanced stenosis of the left V4 segment. Electronically Signed   By: Monte Fantasia M.D.   On: 02/15/2017 14:45   Ir Sherre Lain Stent Cerv Carotid W/o Emb-prot Mod Sed Result Date: 02/16/2017 IIMPRESSION: Status post endovascular complete revascularization of occluded left middle cerebral artery with 1 pass with 4 mm x 40 mm Solitaire FR retrieval device achieving a TICI 3 reperfusion. 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 Patient loaded with the 650 mg of aspirin, and 300 mg of Plavix approximately 20 minutes prior to angioplasty of the left internal carotid artery proximally. PLAN: CT scan of the brain postprocedure. Electronically Signed   By: Luanne Bras M.D.   On: 02/15/2017 23:12   Ct Cerebral Perfusion W Contrast Result Date: 02/14/2017  IMPRESSION: 1. Left MCA distribution perfusion anomaly. Ischemic penumbra of 33 cc calculated by mismatch volume. 2. 0 cc infarct core (CBF <30%) by perfusion. This does not included the infarct core volume of the ASPECT 8 infarct on non contrast CT of the head involving the part of the left insula and putamen as visible infarct on noncontrast CT can be pseudo normalized on perfusion. These results were called by telephone at the  time of interpretation on 02/14/2017 at 10:01 pm to Dr. Kerney Elbe , who verbally acknowledged these results. Electronically Signed   By: Kristine Garbe M.D.   On: 02/14/2017  22:01   Dg Chest Port 1 View Result Date: 02/16/2017  IMPRESSION: 1. Lines and tubes in stable position. 2. Mild bibasilar subsegmental atelectasis.  No focal infiltrate. Electronically Signed   By: Marcello Moores  Register   On: 02/16/2017 09:38   Portable Chest Xray Result Date: 02/16/2017  IMPRESSION: 1. Lines and tubes in stable position. 2. Interim improved aeration left lung base. No acute cardiopulmonary disease identified. Electronically Signed   By: Marcello Moores  Register   On: 02/16/2017 08:11   Portable Chest Xray Result Date: 02/15/2017 MPRESSION: 1. ACDF of the lower cervical spine. 2. Satisfactory support line and tube positions. 3. Atelectasis and/or scarring at the left lung base. 4. Aortic atherosclerosis. Electronically Signed   By: Ashley Royalty M.D.   On: 02/15/2017 03:16   Dg Swallowing Func-speech Pathology Result Date: 02/17/2017 CLINICAL IMPRESSIONS 02/17/2017 Clinical Impression Pt exhibits min-mild oropharyngeal dysphagia. Decreased oral cohesion resulting in premature spill x 1 into the laryngeal vestibule with trace remaining on upper anterior wall post swallow. Flash laryngeal penetration with straw sip thin likely due to increased velocity and volume. Mild intermittent vallecular stasis cleared with spontaneous additional swalllow. Recommend Dys 2 texture given oral deficits/deconditioning, thin liquids, no straws, pills whole in puree, sit upright, small bites/sips. Will continue to follow for safety with recommendations.     Mr Jodene Nam Head/brain BV Cm Result Date: 02/15/2017  IMPRESSION: 1. Acute confluent infarct involving the left putamen, caudate body, and corona radiata. 2 tiny cortical infarcts in the left MCA distribution. 2. Extensive chronic white matter disease, some combination of patient's  multiple sclerosis and chronic microvascular ischemia. 3. Continued good patency of recently re- cannulized left ICA and M1 segment. 4. Mild left M1 segment irregularity. Moderate left M2 inferior division stenosis. 5. Moderate to advanced stenosis of the left V4 segment. Electronically Signed   By: Monte Fantasia M.D.   On: 02/15/2017 14:45   Ir Percutaneous Art Thrombectomy/infusion Intracranial Inc Diag Angio Result Date: 02/16/2017  IMPRESSION: Status post endovascular complete revascularization of occluded left middle cerebral artery with 1 pass with 4 mm x 40 mm Solitaire FR retrieval device achieving a TICI 3 reperfusion. 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 Patient loaded with the 650 mg of aspirin, and 300 mg of Plavix approximately 20 minutes prior to angioplasty of the left internal carotid artery proximally. PLAN: CT scan of the brain postprocedure. Electronically Signed   By: Luanne Bras M.D.   On: 02/15/2017 23:12   Ct Head Code Stroke W/o Cm Result Date: 02/14/2017  IMPRESSION: CT head: 1. Focal hypodensity in left insula and left posterior putamen compatible with infarction. 2. Hyperdensity and distal left M1. 3. ASPECTS is 8 CTA neck: 1. Occlusion of the left common and internal carotid artery from the arch to the distal cavernous segment. 2. Patent right common carotid system and vertebral arteries without dissection, aneurysm, or significant stenosis. 3. Moderate calcific atherosclerosis of the aortic arch 4. Centrilobular emphysema of lung apices. CTA head: 1. Patent paraclinoid and terminal segments of left internal carotid artery and minimal flow in proximal left M1 likely retrograde via a tiny left posterior communicating artery. 2. Occlusion  of mid and distal left M1 with poor left MCA collateralization. 3. Left A1 occlusion. Bilateral A2 are patent with the left likely perfused  via the anterior communicating artery. Symmetric distal ACA circulation. 4. Patent right MCA and posterior circulation. 5. No intracranial aneurysm identified. These results were called by telephone at the time of interpretation on 02/14/2017 at 8:56 pm to Dr. Orlie Dakin ; [facility] , who verbally acknowledged these results. Electronically Signed   By: Kristine Garbe M.D.   On: 02/14/2017 21:08   Ir Angio Intra Extracran Sel Com Carotid Innominate Uni R Mod Sed Result Date: 02/16/2017 IMPRESSION: Status post endovascular complete revascularization of occluded left middle cerebral artery with 1 pass with 4 mm x 40 mm Solitaire FR retrieval device achieving a TICI 3 reperfusion. 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 Patient loaded with the 650 mg of aspirin, and 300 mg of Plavix approximately 20 minutes prior to angioplasty of the left internal carotid artery proximally. PLAN: CT scan of the brain postprocedure. Electronically Signed   By: Luanne Bras M.D.   On: 02/15/2017 23:12   Ir Angio Vertebral Sel Subclavian Innominate Uni R Mod Sed Result Date: 02/16/2017  IMPRESSION: Status post endovascular complete revascularization of occluded left middle cerebral artery with 1 pass with 4 mm x 40 mm Solitaire FR retrieval device achieving a TICI 3 reperfusion. Status post endovascular complete revascularization of symptomatic acute occlusion of the left internal carotid artery at the bulb, the left internal carotid artery in the supraclinoid segment, with stent assisted angioplasty, and a total of 8.1 mg of  intra-arterial Integrilin. Patient loaded with the 650 mg of aspirin, and 300 mg of Plavix approximately 20 minutes prior to angioplasty of the left internal carotid artery proximally. PLAN: CT scan of the brain postprocedure. Electronically Signed   By: Luanne Bras M.D.   On: 02/15/2017 23:12    Assessment and Plan:   1. Acute Stroke: Neurosurgery and neurology stroke team to manage  2.  Atrial fibrillation with RVR: She had bradycardia rates mid 40s two days ago with this morning developing rapid atrial fibrillation. Her rhythm converted with amiodarone and esmolol. She is now rate controlled and in sinus rhythm. She is on Brilinta by Neurosurgery after intervention, unresponsive to Plavix. -- CHADVASC 4  ( CHF/1, female/1, stroke/2), pt will eventually need anticoagulation when safe from a neurosurgical standpoint.  -- Reports discuss that patient has been somewhat agitated and spitting out medication, I feel it is safest to keep her Amiodarone IV for now until improved mental status. -- Recommend TEE  3. Hyperlipidemia: LDL 74, statin added Atorva 40 mg PO daily  Will discuss with attending for further recommendations.  Kristopher Glee, PA-C  02/17/2017 5:22 PM   The patient was seen, examined and discussed with Delos Haring, PA-C and I agree with the above.   Kaitlin [name] is a 71 y.o. female with a hx of CAD, cardiac cath/PCI (Dr. Zadie Rhine, 4/12,01): 90% prox RCA tx with BX velocity stent, high cholesterol and multiple sclerosis who was admitted on 7/16  with an acute right sided ischemic stroke, CTA brain shows ICA occlusion into M1 she underwent an endovascular procedure resulting in complete revascularization of the occluded Lt MCA. Troponin mildly elevated at 0.03. Started on Brilinta (7/18) by Dr. Tommie Raymond. She developed atrial fibrillation with RVR at 2 am ventricular rates up to 180 BPM, she was started on amiodarone and esmolol  drip and cardioverted to SR by 6:30  am (total 4.5 hrs). Previous ECGs shows SB 40-50 BPM, currently 80'.  Echo shows LVEF 65-70%, moderate concentric LVH, grade 1 diastolic dysfunction, trivial MR, normal left atrial size.   Plan: I would complete infusions of amiodarone and esmolol. Start metoprolol 50 mg po BID. Start Eliquis 5 mg po BID for a-fib with stroke CHA2DS2/VAS Stroke Risk Points: 5. Start Eliquis when acceptable from neurology standpoint with regards to postischemic hemorrhagic conversion.  I am concerned that combination of Eliquis and Brilinta might result in higher risk of bleeding as Eliquis and Plavix. If ok from neurology standpoint please consider switching Brilinta to Plavix. Discontinue aspirin.     Kaitlin Dawley, MD 02/17/2017

## 2017-02-17 NOTE — Progress Notes (Signed)
PT Cancellation Note  Patient Details Name: Kaitlin [name] MRN: [id number] DOB: 07-28-1946   Cancelled Treatment:    Reason Eval/Treat Not Completed: Patient not medically ready. Pt currently on bedrest. Please update activity orders when appropriate to initiate PT.    Marylynn Pearson 02/17/2017, 7:23 AM   Conni Slipper, PT, DPT Acute Rehabilitation Services Pager: [phone]

## 2017-02-17 NOTE — Progress Notes (Signed)
eLink Physician-Brief Progress Note Patient Name: Kaitlin [name] DOB: [birth year] MRN: [id number]   Date of Service  02/17/2017  HPI/Events of Note  amiodaron einfusion started, afib with RVR in 150's  eICU Interventions  Start esmolol, primary team to assess and consult cardiology     Intervention Category Evaluation Type: Other  Erin Fulling 02/17/2017, 5:38 AM

## 2017-02-18 ENCOUNTER — Inpatient Hospital Stay (HOSPITAL_COMMUNITY): Payer: Medicare Other

## 2017-02-18 DIAGNOSIS — I69322 Dysarthria following cerebral infarction: Secondary | ICD-10-CM

## 2017-02-18 DIAGNOSIS — J690 Pneumonitis due to inhalation of food and vomit: Secondary | ICD-10-CM

## 2017-02-18 DIAGNOSIS — G8191 Hemiplegia, unspecified affecting right dominant side: Secondary | ICD-10-CM

## 2017-02-18 DIAGNOSIS — J9601 Acute respiratory failure with hypoxia: Secondary | ICD-10-CM

## 2017-02-18 DIAGNOSIS — I63032 Cerebral infarction due to thrombosis of left carotid artery: Secondary | ICD-10-CM

## 2017-02-18 DIAGNOSIS — I4891 Unspecified atrial fibrillation: Secondary | ICD-10-CM

## 2017-02-18 LAB — CBC
HCT: 31.5 % — ABNORMAL LOW (ref 36.0–46.0)
HEMOGLOBIN: 10.5 g/dL — AB (ref 12.0–15.0)
MCH: 31.5 pg (ref 26.0–34.0)
MCHC: 33.3 g/dL (ref 30.0–36.0)
MCV: 94.6 fL (ref 78.0–100.0)
Platelets: 210 10*3/uL (ref 150–400)
RBC: 3.33 MIL/uL — ABNORMAL LOW (ref 3.87–5.11)
RDW: 13.5 % (ref 11.5–15.5)
WBC: 15.9 10*3/uL — AB (ref 4.0–10.5)

## 2017-02-18 LAB — BASIC METABOLIC PANEL
ANION GAP: 9 (ref 5–15)
BUN: 16 mg/dL (ref 6–20)
CALCIUM: 8.2 mg/dL — AB (ref 8.9–10.3)
CO2: 21 mmol/L — ABNORMAL LOW (ref 22–32)
Chloride: 109 mmol/L (ref 101–111)
Creatinine, Ser: 0.59 mg/dL (ref 0.44–1.00)
GLUCOSE: 114 mg/dL — AB (ref 65–99)
POTASSIUM: 3.6 mmol/L (ref 3.5–5.1)
SODIUM: 139 mmol/L (ref 135–145)

## 2017-02-18 LAB — MAGNESIUM: MAGNESIUM: 2.1 mg/dL (ref 1.7–2.4)

## 2017-02-18 MED ORDER — LEVOFLOXACIN IN D5W 750 MG/150ML IV SOLN
750.0000 mg | INTRAVENOUS | Status: DC
  Administered 2017-02-18 – 2017-02-21 (×4): 750 mg via INTRAVENOUS
  Filled 2017-02-18 (×6): qty 150

## 2017-02-18 MED ORDER — FUROSEMIDE 10 MG/ML IJ SOLN
20.0000 mg | Freq: Once | INTRAMUSCULAR | Status: AC
  Administered 2017-02-18: 20 mg via INTRAVENOUS
  Filled 2017-02-18: qty 2

## 2017-02-18 NOTE — Progress Notes (Signed)
PULMONARY / CRITICAL CARE MEDICINE   Name: Kaitlin [name] MRN: [id number] DOB: [birth year]    ADMISSION DATE:  02/14/2017 CONSULTATION DATE:  02/15/17  REFERRING MD:  Otelia Limes  CHIEF COMPLAINT:  AMS  BRIEF PATIENT SUMMARY:   Kaitlin [name] is a 71 y.o. F with PMH of  HLD and MS.  She presented to [facility] ED 7/17 with right sided weakness and aphasia.  She was working in the yard with husband around 11 am the morning prior and got very hot and was not talking normally.  She later took a shower and ate lunch and acted normal for the most part.  Later she was unable to get up and had right sided weakness.  She was brought to [facility] where CT demonstrated left MCA occlusion.  She was intubated and was taken to IR where she had b/l common carotid arteriograms and right verterbral arteriogram with complete revascularization of occluded left MCA and stent assisted angioplasty of symptomatic acute occlusion of prox left ICA.  She returned to the ICU on the vent and PCCM was asked to assist with vent management.  SUBJECTIVE:   MBS with dysphagia 2 diet Per RN, agitated overnight w/hypoxia that required NRB, was on VM but desated again while eating, now on HFLNC 8L and weaning Remains on esmolol for hr and bp control  VITAL SIGNS: BP (!) 170/70   Pulse 68   Temp 98.3 F (36.8 C)   Resp (!) 28   Ht 5\' 2"  (1.575 m)   Wt 131 lb 6.3 oz (59.6 kg)   SpO2 98%   BMI 24.03 kg/m   HEMODYNAMICS:   VENTILATOR SETTINGS: FiO2 (%):  [50 %-55 %] 50 % INTAKE / OUTPUT: I/O last 3 completed shifts: In: 2688.1 [P.O.:120; I.V.:2568.1] Out: 125 [Urine:125]  PHYSICAL EXAMINATION: General:  Weak appearing elderly female lying in bed in NAD HEENT: MM pink/dry, slight right facial droop, pupils 3/=, slurred speech Neuro: Alert, slow mentation, oriented to person, follows commands, minimally holds RUE and RLE to gravity, normal left strength CV:  SR 69, rrr, no murmur PULM: even/non-labored, faint bibasilar rales,  right anterior rhonchi GI: soft, non-tender, bs active  Extremities: warm/dry, trace edema Skin: no rashes or lesions  LABS:  BMET  Recent Labs Lab 02/16/17 0830 02/17/17 0232 02/18/17 0215  NA 141 141 139  K 4.3 3.3* 3.6  CL 115* 112* 109  CO2 21* 21* 21*  BUN 25* 17 16  CREATININE 0.65 0.62 0.59  GLUCOSE 114* 107* 114*    Electrolytes  Recent Labs Lab 02/16/17 0830 02/17/17 0232 02/18/17 0215  CALCIUM 8.2* 8.5* 8.2*  MG 2.1  --  2.1    CBC  Recent Labs Lab 02/16/17 0830 02/17/17 0232 02/18/17 0215  WBC 10.0 10.5 15.9*  HGB 9.2* 10.4* 10.5*  HCT 29.2* 33.3* 31.5*  PLT 162 156 210    Coag's  Recent Labs Lab 02/14/17 2030  APTT 25  INR 0.94    Sepsis Markers No results for input(s): LATICACIDVEN, PROCALCITON, O2SATVEN in the last 168 hours.  ABG  Recent Labs Lab 02/15/17 0322  PHART 7.245*  PCO2ART 44.1  PO2ART 192.0*    Liver Enzymes  Recent Labs Lab 02/14/17 2030  AST 25  ALT 20  ALKPHOS 58  BILITOT 0.4  ALBUMIN 4.0    Cardiac Enzymes  Recent Labs Lab 02/15/17 0800 02/15/17 1458 02/15/17 2015  TROPONINI <0.03 0.03* 0.03*    Glucose No results for input(s): GLUCAP in the last 168 hours.  Imaging No results found.   STUDIES:  CTA head 7/17 > focal hypodensity in left insula and left posterior putamen.  Left MCA perfusion anomaly.  CT head 7/17 > contrast staining vs blood products left basal ganglia to left temporal lobe. MRI/MRA 7/17> acute confluent infarct involving left putamen, caudate body, and corona radiata; 2 tiny cortical infarcts in the left MCA distribution; extensive chronic white matter disease, combination of patient's MS and chronic microvascular ischemia; continued good patency of recently re-cannulized left ICA and M1 segment; mild left M1 segment irregularity.  Moderate left M2 inferior division stenosis; mod to advanced stenosis of left V4 segment TTE 7/18 >> LVEF 65-70%, moderate concentric  hypertrophy, no RWI, G1DD, AV mildly thickened and calcified w/mild stenosis, MV calcified and thickened with trivial regurgitation, trivial TR, no cardiac source of emboli identified BLE venous duplex 7/19 >> neg  CULTURES: None.  ANTIBIOTICS: None.  SIGNIFICANT EVENTS: 7/17 > admit.  Taken to IR for b/l common carotid arteriograms and right verterbral arteriogram with complete revascularization of occluded left MCA and stent assisted angioplasty of symptomatic acute occlusion of prox left ICA.  LINES/TUBES: ETT 7/17 > 7/18 OGT 7/17 > 7/18 R groin sheath 7/17 >7/17 at 1644 Right Aline 7/17 > 7/18  DISCUSSION: 71 y.o. F admitted with left MCA stroke, taken to IR for revascularization and L ICA stent, then returned to ICU on vent.  ASSESSMENT / PLAN: NEUROLOGIC A:   Acute L MCA stroke S/p b/l CCA and R vertebral arteriogram with L ICA stent Hx MS Delirium  P:   Management and further imaging per Neurology  Antiplatelet/anticoag per neuro/cards Continue atorvastatin, Neurontin, prozac, effexor-XR LE venous doppler negative  PULMONARY A: Intubated for airway protection - resolved, extubated 7/18 At risk for aspiration  Hypoxic respiratory failure- possibly mild pulmonary edema vs aspiration vs atelectasis  P:   Wean HFNC for sats > 94% CXR pending Aggressive pulmonary hygiene with IS, and PT Continue Dysphagia 2 diet She is net +7L (wt change on 1lb), lasix 20mg  x 1   CARDIOVASCULAR A:  Afib w/RVR - now SR Bradycardia- resolved Prolonged QTc Hypotension- resolved Hx HLD - neg TTE 7/18 for source of emboli, LVEF 65-70, mild AS P:  Telemetry Management per Cards Metoprolol BID Defer anticoagulation to Cards/Neuro Esmolol to finish then start metoprolol 50mg  BID  RENAL A:   Hypokalemia - voiding post foley removal  P:   NS KVO KCL 20 meq x 2  Trend BMP / urinary output Replace electrolytes as indicated Avoid nephrotoxic agents, ensure adequate renal  perfusion  GASTROINTESTINAL A:   GI ppx Dysphagia  P:   PPI for SUP Dysphagia 2 diet Aspiration precautions  HEMATOLOGIC A:   DVT ppx P:  lovenox and SCDs Trend CBC  INFECTIOUS A:   Leukocytosis  -new 7/20, afebrile P:  Monitor fever curve/ Trend CBC CXR pending  ENDOCRINE A:   No acute issues   P:  Monitor on BMET   FAMILY  Updates: no family at bedside  - Inter-disciplinary family meet or Palliative Care meeting due by:  ongoing  CCT 30 mins  Posey Boyer, AGACNP-BC [facility] Pgr: [phone] or if no answer [phone] 02/18/2017, 11:42 AM  Attending Note:  I have examined patient, reviewed labs, studies and notes. I have discussed the case with B Simpson, and I agree with the data and plans as amended above. 71 yo woman w MS, CAD, now s/p IR revascularization and L ICA stent  for L MCA stroke. She was extubated 7/18. Post op course c/b by delirium, brief period rapid A fib, hypertension. She has been treated with amiodarone and esmolol, is back in NSR. Over the last 24h has continued to have confusion, developed hypoxemia. Her FiO2 has been titrated up overnight, currently on 8L/min high flow cannula. On my exam she attempts to answer questions but is confused. She has decreased basilar BS, B insp crackles. Moves her L UE but weakly. Barely moves her RUE. I suspect some degree of volume overload and pulm edema due to HTN and dCHF. We will perform CXR, empirically diurese. Add metoprolol and wean esmolol to off. Push pulm hygiene. Try to minimize sedating meds, frequent reorientation.  Independent critical care time is 35 minutes  Levy Pupa, MD, PhD 02/18/2017, 12:31 PM [facility] [phone] or if no answer [phone]

## 2017-02-18 NOTE — NC FL2 (Signed)
[place] LEVEL OF CARE SCREENING TOOL     IDENTIFICATION  Patient Name: Kaitlin [name] Birthdate: 1945/08/12 Sex: female Admission Date (Current Location): 02/14/2017  [facility] and [place] Number:  Herbalist and Address:  The [place]. [facility], [place] [address] [place], [place] [address]      Provider Number: [id number]  Attending Physician Name and Address:  Rosalin Hawking, MD  Relative Name and Phone Number:  Wille Glaser, spouse    Current Level of Care: Hospital Recommended Level of Care: [place] Prior Approval Number:    Date Approved/Denied:   PASRR Number: [id number]  Discharge Plan: SNF    Current Diagnoses: Patient Active Problem List   Diagnosis Date Noted  . Paroxysmal A-fib ([place]) 02/17/2017  . Atrial fibrillation with RVR ([place]) 02/17/2017  . HLD (hyperlipidemia) 02/17/2017  . Acute hypoxemic respiratory failure ([place])   . Stroke (cerebrum) ([place]) 02/14/2017    Orientation RESPIRATION BLADDER Height & Weight     Self  O2 (HFNC 6L) Continent, External catheter Weight: 59.6 kg (131 lb 6.3 oz) Height:  5' 2"  (157.5 cm)  BEHAVIORAL SYMPTOMS/MOOD NEUROLOGICAL BOWEL NUTRITION STATUS      Continent Diet (Please see DC Summary)  AMBULATORY STATUS COMMUNICATION OF NEEDS Skin   Extensive Assist Verbally Normal                       Personal Care Assistance Level of Assistance  Bathing, Feeding, Dressing Bathing Assistance: Maximum assistance Feeding assistance: Limited assistance Dressing Assistance: Maximum assistance     Functional Limitations Info             SPECIAL CARE FACTORS FREQUENCY  PT (By licensed PT), OT (By licensed OT)     PT Frequency: 5x/week OT Frequency: 3x/week            Contractures      Additional Factors Info  Allergies, Code Status, Psychotropic Code Status Info: Full Allergies Info: Penicillins Psychotropic Info: Prozac; Effexor         Current Medications  (02/18/2017):  This is the current hospital active medication list Current Facility-Administered Medications  Medication Dose Route Frequency Provider Last Rate Last Dose  . 0.9 %  sodium chloride infusion   Intravenous Continuous Rosalin Hawking, MD 50 mL/hr at 02/18/17 1600    . acetaminophen (TYLENOL) tablet 650 mg  650 mg Oral Q4H PRN Deveshwar, Willaim Rayas, MD       Or  . acetaminophen (TYLENOL) solution 650 mg  650 mg Per Tube Q4H PRN Luanne Bras, MD   650 mg at 02/15/17 2048   Or  . acetaminophen (TYLENOL) suppository 650 mg  650 mg Rectal Q4H PRN Deveshwar, Willaim Rayas, MD      . amiodarone (NEXTERONE PREMIX) 360-4.14 MG/200ML-% (1.8 mg/mL) IV infusion  30 mg/hr Intravenous Continuous Flora Lipps, MD   Stopped at 02/17/17 2217  . aspirin EC tablet 81 mg  81 mg Oral Q breakfast Rosalin Hawking, MD   81 mg at 02/18/17 0953  . atorvastatin (LIPITOR) tablet 40 mg  40 mg Oral q1800 Kerney Elbe, MD   40 mg at 02/17/17 1816  . calcium-vitamin D (OSCAL WITH D) 500-200 MG-UNIT per tablet 1 tablet  1 tablet Oral Daily Honor Loh, [place]   1 tablet at 02/18/17 1000  . chlorhexidine gluconate (MEDLINE KIT) (PERIDEX) 0.12 % solution 15 mL  15 mL Mouth Rinse BID Kerney Elbe, MD   15 mL at 02/18/17 0818  .  clevidipine (CLEVIPREX) infusion 0.5 mg/mL  0-21 mg/hr Intravenous Continuous Deveshwar, Sanjeev, MD      . enoxaparin (LOVENOX) injection 40 mg  40 mg Subcutaneous Q24H Kerney Elbe, MD   40 mg at 02/18/17 1002  . esmolol (BREVIBLOC) 2000 mg / 100 mL (20 mg/mL) infusion  25-300 mcg/kg/min Intravenous Titrated Flora Lipps, MD 4.5 mL/hr at 02/18/17 1600 25.168 mcg/kg/min at 02/18/17 1600  . fentaNYL (SUBLIMAZE) bolus via infusion 25 mcg  25 mcg Intravenous Q1H PRN Desai, Rahul P, PA-C   25 mcg at 02/15/17 1800  . fentaNYL (SUBLIMAZE) injection 50 mcg  50 mcg Intravenous Once Desai, Rahul P, PA-C      . fentaNYL 2552mg in NS 2569m(1031mml) infusion-PREMIX  25-400 mcg/hr Intravenous Continuous Desai,  Rahul P, PA-C   Stopped at 02/16/17 0800  . FLUoxetine (PROZAC) capsule 10 mg  10 mg Oral Daily LinKerney ElbeD   10 mg at 02/18/17 0958  . gabapentin (NEURONTIN) capsule 300 mg  300 mg Oral PRN LinKerney ElbeD      . hydrALAZINE (APRESOLINE) injection 10 mg  10 mg Intravenous Q4H PRN LinKerney ElbeD   10 mg at 02/17/17 2307  . levofloxacin (LEVAQUIN) IVPB 750 mg  750 mg Intravenous Q24H Xu,Rosalin HawkingD 100 mL/hr at 02/18/17 1510 750 mg at 02/18/17 1510  . metoprolol tartrate (LOPRESSOR) tablet 50 mg  50 mg Oral BID NelDorothy SparkD   50 mg at 02/18/17 [id number] midazolam (VERSED) injection 1 mg  1 mg Intravenous Q2H PRN DesShearon Stallsahul P, PA-C   1 mg at 02/18/17 0007  . multivitamin (PROSIGHT) tablet 1 tablet  1 tablet Oral BID LinKerney ElbeD   1 tablet at 02/18/17 1000  . pantoprazole (PROTONIX) EC tablet 40 mg  40 mg Oral Daily DurAlvira PhilipsPHNorth Carolina40 mg at 02/18/17 [id number] phenylephrine (NEO-SYNEPHRINE) 10 mg in sodium chloride 0.9 % 250 mL (0.04 mg/mL) infusion  0-400 mcg/min Intravenous Titrated Desai, Rahul P, PA-C   Stopped at 02/16/17 0800  . senna-docusate (Senokot-S) tablet 1 tablet  1 tablet Oral QHS PRN LinKerney ElbeD      . ticagrelor (BRGrand View Hospitalablet 90 mg  90 mg Oral BID BlaArdis RowanA-C   90 mg at 02/18/17 [id number] venlafaxine XR (EFFEXOR-XR) 24 hr capsule 75 mg  75 mg Oral Daily LinKerney ElbeD   75 mg at 02/18/17 [id number]  Discharge Medications: Please see discharge summary for a list of discharge medications.  Relevant Imaging Results:  Relevant Lab Results:   Additional Information SSN: [place]

## 2017-02-18 NOTE — Consult Note (Signed)
Physical Medicine and Rehabilitation Consult Reason for Consult: Right sided weakness and aphasia with history of multiple sclerosis Referring Physician: Dr.Xu   HPI: Kaitlin [name] is a 71 y.o. right handed female with a 20 year history of multiple sclerosis, CAD with stenting. Per chart review patient lives with spouse. Independent prior to admission and enjoys gardening. Multilevel home with 4 steps to entry. Presented 02/14/2017 with right-sided weakness and garbled speech. CT showed focal hypodensity and left insula and left posterior putamen. Patient did not receive TPA. CT angiogram head and neck showed occlusion of the left common and internal carotid artery from the arch to the distal cavernous segment. CT cerebral perfusion scan showed left MCA distribution infarct. EKG showed atrial fibrillation. Echocardiogram with ejection fraction of 70% grade 1 diastolic dysfunction. Underwent mechanical thrombectomy left ICA stenting per interventional radiology.Marland Kitchen MRI 02/15/2017 showed acute confluent infarct left putamen, caudate head and corona radiata as well as tiny cortical infarcts in the left MCA distribution. Patient did require intubation for a short time. Bilateral lower extremity Dopplers negative. Cardiology follow-up for atrial fibrillation initially placed on amiodarone with rate control. Patient presently on aspirin as well as Brilinta for CVA prophylaxis in light of atrial fibrillation. Dysphagia #2 thin liquid diet. Physical therapy evaluation completed 02/17/2017 with recommendations of physical medicine rehabilitation consult.  Patient is severely dysarthric, however, she is able to state that she was able to ambulate without assisted device prior to CVA Has a sitter, according to sitter, patient has not been trying to get out of bed or out of her chair  Review of Systems  Constitutional: Negative for chills and fever.  HENT: Negative for hearing loss.   Eyes: Negative for  blurred vision and double vision.  Respiratory: Negative for cough and shortness of breath.   Cardiovascular: Negative for chest pain, palpitations and leg swelling.  Gastrointestinal: Positive for constipation. Negative for nausea and vomiting.  Genitourinary: Negative for dysuria, flank pain and hematuria.  Musculoskeletal: Positive for myalgias.  Skin: Negative for rash.  Neurological: Positive for speech change and weakness. Negative for seizures.  Psychiatric/Behavioral: Positive for depression.  All other systems reviewed and are negative.  Past Medical History:  Diagnosis Date  . High cholesterol   . Multiple sclerosis (HCC)    Past Surgical History:  Procedure Laterality Date  . CORONARY ANGIOPLASTY WITH STENT PLACEMENT    . IR ANGIO INTRA EXTRACRAN SEL COM CAROTID INNOMINATE UNI R MOD SED  02/15/2017  . IR ANGIO VERTEBRAL SEL SUBCLAVIAN INNOMINATE UNI R MOD SED  02/15/2017  . IR INTRAVSC STENT CERV CAROTID W/O EMB-PROT MOD SED INC ANGIO  02/15/2017  . IR PERCUTANEOUS ART THROMBECTOMY/INFUSION INTRACRANIAL INC DIAG ANGIO  02/15/2017  . NECK SURGERY    . RADIOLOGY WITH ANESTHESIA N/A 02/14/2017   Procedure: RADIOLOGY WITH ANESTHESIA;  Surgeon: Radiologist, Medication, MD;  Location: [facility] OR;  Service: Radiology;  Laterality: N/A;  . WRIST SURGERY     No family history on file. Social History:  reports that she has never smoked. She has never used smokeless tobacco. She reports that she drinks about 1.8 oz of alcohol per week . She reports that she does not use drugs. Allergies:  Allergies  Allergen Reactions  . Penicillins    Medications Prior to Admission  Medication Sig Dispense Refill  . aspirin EC 81 MG tablet Take 81 mg by mouth daily.    . Calcium Citrate-Vitamin D (CALCIUM CITRATE + D PO) Take 1 tablet  by mouth daily.    Marland Kitchen FLUoxetine (PROZAC) 10 MG capsule Take 10 mg by mouth daily.    Marland Kitchen gabapentin (NEURONTIN) 300 MG capsule Take 300 mg by mouth as needed.  0  .  Multiple Vitamins-Minerals (PRESERVISION AREDS PO) Take 1 tablet by mouth 2 (two) times daily.    Marland Kitchen omega-3 acid ethyl esters (LOVAZA) 1 g capsule Take 1 g by mouth 2 (two) times daily. 1200 mg    . pravastatin (PRAVACHOL) 10 MG tablet Take 10 mg by mouth daily.     Marland Kitchen venlafaxine XR (EFFEXOR-XR) 75 MG 24 hr capsule Take 75 mg by mouth daily.    . traMADol (ULTRAM) 50 MG tablet Take 1 tablet (50 mg total) by mouth every 6 (six) hours as needed for pain. (Patient not taking: Reported on 02/14/2017) 15 tablet 0    Home: Home Living Family/patient expects to be discharged to:: Inpatient rehab Living Arrangements: Spouse/significant other Available Help at Discharge: Family, Available 24 hours/day Type of Home: House Home Access: Stairs to enter [employer] of Steps: 4 Entrance Stairs-Rails: Right Home Layout: Multi-level Alternate Level Stairs-Number of Steps: 1 (to bedroom; none to bathroom) Bathroom Shower/Tub: Tub only, Health visitor: Standard Bathroom Accessibility: Yes Home Equipment: Shower seat - built in  Lives With: Spouse  Functional History: Prior Function Level of Independence: Independent Comments: Pt gardening the morning she was admitted Functional Status:  Mobility: Bed Mobility Overal bed mobility: Needs Assistance Bed Mobility: Supine to Sit Supine to sit: Mod assist, +2 for physical assistance, +2 for safety/equipment, HOB elevated General bed mobility comments: Pt with increased initiation of movement towards EOB with cues. Assist provided for RLE advancement and general trunk support with raise to sitting. Bed pad used to assist in scooting fully to EOB.  Transfers Overall transfer level: Needs assistance Equipment used: 2 person hand held assist Transfers: Sit to/from Stand, [employer] Transfers Sit to Stand: Mod assist, +2 physical assistance, +2 safety/equipment Squat pivot transfers: Mod assist, +2 physical assistance, +2  safety/equipment General transfer comment: Bed pad used to assist in squat pivot bed>chair. Pt able to advance LE's without assist, however noted continued significant weakness in RLE.  Ambulation/Gait General Gait Details: Unable at this time    ADL:    Cognition: Cognition Overall Cognitive Status: Impaired/Different from baseline Arousal/Alertness: Lethargic Orientation Level: Oriented to person, Oriented to time, Disoriented to place, Disoriented to situation Attention: Sustained Sustained Attention: Impaired Sustained Attention Impairment: Verbal basic Memory:  (TBA) Awareness: Impaired Awareness Impairment: Anticipatory impairment, Emergent impairment Problem Solving: Impaired Problem Solving Impairment: Functional basic Executive Function: Self Monitoring, Self Correcting Safety/Judgment: Impaired Cognition Arousal/Alertness: Awake/alert Behavior During Therapy: Flat affect Overall Cognitive Status: Impaired/Different from baseline Area of Impairment: Orientation, Attention, Memory, Following commands, Safety/judgement, Awareness, Problem solving Orientation Level: Disoriented to, Place, Time, Situation Current Attention Level: Sustained Memory: Decreased short-term memory, Decreased recall of precautions Following Commands: Follows one step commands inconsistently, Follows one step commands with increased time Safety/Judgement: Decreased awareness of safety, Decreased awareness of deficits Awareness: Intellectual Problem Solving: Slow processing, Decreased initiation, Difficulty sequencing, Requires verbal cues, Requires tactile cues  Blood pressure (!) 147/58, pulse 65, temperature 98.3 F (36.8 C), resp. rate (!) 23, height 5\' 2"  (1.575 m), weight 59.6 kg (131 lb 6.3 oz), SpO2 97 %. Physical Exam  HENT:  Head: Normocephalic.  Eyes: EOM are normal.  Neck: Normal range of motion. Neck supple. No thyromegaly present.  Cardiovascular: Normal rate and regular rhythm.  Respiratory: Effort normal and breath sounds normal. No respiratory distress.  GI: Soft. Bowel sounds are normal. She exhibits no distension.  Neurological: She is alert.  Speech is a bit slurred. Follows simple commands. Provides name and age. Fair awareness of deficits  Skin: Skin is dry.  Motor strength is 2 minus in the right deltoid, bicep, triceps, finger flexors and trace extensors. 3 minus, right hip flexor, knee extensors, 0 at the ankle dorsiflexor, plantar flexor toe extensors and flexors. 4 plus in the left deltoid, bicep, tricep, grip, hip flexor, knee extensor, ankle dorsiflexor and plantar flexor. Sensation is reported as equal bilateral upper limbs, as well as bilateral lower limbs, although dysarthria does make sensory testing difficult  Results for orders placed or performed during the hospital encounter of 02/14/17 (from the past 24 hour(s))  Basic metabolic panel     Status: Abnormal   Collection Time: 02/18/17  2:15 AM  Result Value Ref Range   Sodium 139 135 - 145 mmol/L   Potassium 3.6 3.5 - 5.1 mmol/L   Chloride 109 101 - 111 mmol/L   CO2 21 (L) 22 - 32 mmol/L   Glucose, Bld 114 (H) 65 - 99 mg/dL   BUN 16 6 - 20 mg/dL   Creatinine, Ser 9.14 0.44 - 1.00 mg/dL   Calcium 8.2 (L) 8.9 - 10.3 mg/dL   GFR calc non Af Amer >60 >60 mL/min   GFR calc Af Amer >60 >60 mL/min   Anion gap 9 5 - 15  CBC     Status: Abnormal   Collection Time: 02/18/17  2:15 AM  Result Value Ref Range   WBC 15.9 (H) 4.0 - 10.5 K/uL   RBC 3.33 (L) 3.87 - 5.11 MIL/uL   Hemoglobin 10.5 (L) 12.0 - 15.0 g/dL   HCT 78.2 (L) 95.6 - 21.3 %   MCV 94.6 78.0 - 100.0 fL   MCH 31.5 26.0 - 34.0 pg   MCHC 33.3 30.0 - 36.0 g/dL   RDW 08.6 57.8 - 46.9 %   Platelets 210 150 - 400 K/uL  Magnesium     Status: None   Collection Time: 02/18/17  2:15 AM  Result Value Ref Range   Magnesium 2.1 1.7 - 2.4 mg/dL   Dg Chest Port 1 View  Result Date: 02/18/2017 CLINICAL DATA:  Crackles in both lungs this  morning on physical examination. EXAM: PORTABLE CHEST 1 VIEW COMPARISON:  Single-view of the chest 02/16/2017. PA and lateral chest and CT chest 04/26/2012. FINDINGS: There is a small to moderate right pleural effusion. Airspace disease is seen in the right lower and right upper lobes. There is also some airspace disease in the medial left lower lobe. No pneumothorax. IMPRESSION: Right much worse than left airspace disease has an appearance most worrisome for pneumonia. Associated small to moderate right pleural effusion is noted. Electronically Signed   By: Drusilla Kanner M.D.   On: 02/18/2017 12:20   Dg Swallowing Func-speech Pathology  Result Date: 02/17/2017 Objective Swallowing Evaluation: Type of Study: MBS-Modified Barium Swallow Study Patient Details Name: Bambie Pizzolato MRN: [id number] Date of Birth: 01/19/1946 Today's Date: 02/17/2017 Time: SLP Start Time (ACUTE ONLY): 1007-SLP Stop Time (ACUTE ONLY): 1022 SLP Time Calculation (min) (ACUTE ONLY): 15 min Past Medical History: Past Medical History: Diagnosis Date . High cholesterol  . Multiple sclerosis (HCC)  Past Surgical History: Past Surgical History: Procedure Laterality Date . CORONARY ANGIOPLASTY WITH STENT PLACEMENT   . IR ANGIO INTRA EXTRACRAN SEL COM CAROTID INNOMINATE  UNI R MOD SED  02/15/2017 . IR ANGIO VERTEBRAL SEL SUBCLAVIAN INNOMINATE UNI R MOD SED  02/15/2017 . IR INTRAVSC STENT CERV CAROTID W/O EMB-PROT MOD SED INC ANGIO  02/15/2017 . IR PERCUTANEOUS ART THROMBECTOMY/INFUSION INTRACRANIAL INC DIAG ANGIO  02/15/2017 . NECK SURGERY   . RADIOLOGY WITH ANESTHESIA N/A 02/14/2017  Procedure: RADIOLOGY WITH ANESTHESIA;  Surgeon: Radiologist, Medication, MD;  Location: [facility] OR;  Service: Radiology;  Laterality: N/A; . WRIST SURGERY   HPI: Patient's 71 year old female with past medical history significant for multiple sclerosis, high cholesterol and ACDF (age indeterminate) admitted with stuttering symptoms, somewhat resolved and right hand weakness. MRI  showed acute confluent infarct involving the left putamen, caudate body, and corona radiata, tiny cortical infarcts in the left MCA distribution, extensive chronic white matter disease, some combination of patient's multiple sclerosis and chronic microvascular ischemia. Pt underwent revascularization and left ICA angioplasty stent 7/17. RN reports intubated total of 30 hours.  No Data Recorded Assessment / Plan / Recommendation CHL IP CLINICAL IMPRESSIONS 02/17/2017 Clinical Impression Pt exhibits min-mild oropharyngeal dysphagia. Decreased oral cohesion resulting in premature spill x 1 into the laryngeal vestibule with trace remaining on upper anterior wall post swallow. Flash laryngeal penetration with straw sip thin likely due to increased velocity and volume. Mild intermittent vallecular stasis cleared with spontaneous additional swalllow. Recommend Dys 2 texture given oral deficits/deconditioning, thin liquids, no straws, pills whole in puree, sit upright, small bites/sips. Will continue to follow for safety with recommendations.   SLP Visit Diagnosis Dysphagia, oropharyngeal phase (R13.12) Attention and concentration deficit following -- Frontal lobe and executive function deficit following -- Impact on safety and function Moderate aspiration risk   CHL IP TREATMENT RECOMMENDATION 02/17/2017 Treatment Recommendations Therapy as outlined in treatment plan below   Prognosis 02/17/2017 Prognosis for Safe Diet Advancement Good Barriers to Reach Goals -- Barriers/Prognosis Comment -- CHL IP DIET RECOMMENDATION 02/17/2017 SLP Diet Recommendations Dysphagia 2 (Fine chop) solids;Thin liquid Liquid Administration via Cup;No straw Medication Administration Whole meds with puree Compensations Slow rate;Small sips/bites;Minimize environmental distractions;Multiple dry swallows after each bite/sip Postural Changes Seated upright at 90 degrees   CHL IP OTHER RECOMMENDATIONS 02/17/2017 Recommended Consults -- Oral Care  Recommendations Oral care BID Other Recommendations --   CHL IP FOLLOW UP RECOMMENDATIONS 02/17/2017 Follow up Recommendations Inpatient Rehab   CHL IP FREQUENCY AND DURATION 02/17/2017 Speech Therapy Frequency (ACUTE ONLY) min 2x/week Treatment Duration 2 weeks      CHL IP ORAL PHASE 02/17/2017 Oral Phase Impaired Oral - Pudding Teaspoon -- Oral - Pudding Cup -- Oral - Honey Teaspoon -- Oral - Honey Cup -- Oral - Nectar Teaspoon -- Oral - Nectar Cup Premature spillage Oral - Nectar Straw -- Oral - Thin Teaspoon -- Oral - Thin Cup Holding of bolus Oral - Thin Straw WFL;Weak lingual manipulation Oral - Puree -- Oral - Mech Soft -- Oral - Regular Delayed oral transit Oral - Multi-Consistency WFL Oral - Pill -- Oral Phase - Comment --  CHL IP PHARYNGEAL PHASE 02/17/2017 Pharyngeal Phase Impaired Pharyngeal- Pudding Teaspoon -- Pharyngeal -- Pharyngeal- Pudding Cup -- Pharyngeal -- Pharyngeal- Honey Teaspoon -- Pharyngeal -- Pharyngeal- Honey Cup -- Pharyngeal -- Pharyngeal- Nectar Teaspoon -- Pharyngeal -- Pharyngeal- Nectar Cup Penetration/Aspiration before swallow;Reduced airway/laryngeal closure Pharyngeal Material enters airway, remains ABOVE vocal cords and not ejected out Pharyngeal- Nectar Straw -- Pharyngeal -- Pharyngeal- Thin Teaspoon -- Pharyngeal -- Pharyngeal- Thin Cup Penetration/Aspiration during swallow;Pharyngeal residue - valleculae Pharyngeal Material enters airway, remains ABOVE vocal cords then  ejected out Pharyngeal- Thin Straw Penetration/Aspiration during swallow;Pharyngeal residue - valleculae Pharyngeal -- Pharyngeal- Puree -- Pharyngeal -- Pharyngeal- Mechanical Soft -- Pharyngeal -- Pharyngeal- Regular WFL Pharyngeal -- Pharyngeal- Multi-consistency WFL Pharyngeal -- Pharyngeal- Pill -- Pharyngeal -- Pharyngeal Comment --  CHL IP CERVICAL ESOPHAGEAL PHASE 02/17/2017 Cervical Esophageal Phase WFL Pudding Teaspoon -- Pudding Cup -- Honey Teaspoon -- Honey Cup -- Nectar Teaspoon -- Nectar Cup --  Nectar Straw -- Thin Teaspoon -- Thin Cup -- Thin Straw -- Puree -- Mechanical Soft -- Regular -- Multi-consistency -- Pill -- Cervical Esophageal Comment -- No flowsheet data found. Royce Macadamia 02/17/2017, 1:26 PM Breck Coons Lonell Face.Ed CCC-SLP Pager [phone]               Assessment/Plan: Diagnosis: Left MCA infarct with right hemiparesis, dysphagia, dysarthria 1. Does the need for close, 24 hr/day medical supervision in concert with the patient's rehab needs make it unreasonable for this patient to be served in a less intensive setting? Yes 2. Co-Morbidities requiring supervision/potential complications: Multiple sclerosis, coronary artery disease 3. Due to bladder management, bowel management, safety, skin/wound care, disease management, medication administration, pain management and patient education, does the patient require 24 hr/day rehab nursing? Yes 4. Does the patient require coordinated care of a physician, rehab nurse, PT (1-2 hrs/day, 5 days/week), OT (1-2 hrs/day, 5 days/week) and SLP (.5-1 hrs/day, 5 days/week) to address physical and functional deficits in the context of the above medical diagnosis(es)? Yes Addressing deficits in the following areas: balance, endurance, locomotion, strength, transferring, bowel/bladder control, bathing, dressing, feeding, grooming, toileting, cognition, speech, language, swallowing and psychosocial support 5. Can the patient actively participate in an intensive therapy program of at least 3 hrs of therapy per day at least 5 days per week? Yes 6. The potential for patient to make measurable gains while on inpatient rehab is excellent 7. Anticipated functional outcomes upon discharge from inpatient rehab are supervision  with PT, supervision and min assist with OT, supervision and min assist with SLP. 8. Estimated rehab length of stay to reach the above functional goals is: 18-21d 9. Anticipated D/C setting: Home 10. Anticipated post D/C  treatments: HH therapy 11. Overall Rehab/Functional Prognosis: excellent  RECOMMENDATIONS: This patient's condition is appropriate for continued rehabilitative care in the following setting: CIR Patient has agreed to participate in recommended program. Yes Note that insurance prior authorization may be required for reimbursement for recommended care.  Comment:   Erick Colace M.D. [facility] FAAPM&R (Sports Med, Neuromuscular Med) Diplomate Am Board of Electrodiagnostic Med  Charlton Amor., PA-C 02/18/2017

## 2017-02-18 NOTE — Progress Notes (Signed)
Progress Note  Patient Name: Ximena Todaro Date of Encounter: 02/18/2017  Primary Cardiologist: Dr. Clovia Cuff ([place])  Subjective   Pt sitting in chair eating lunch. She denies chest pain, shortness of breath, palpitations or lightheadedness. She is minimally conversant, but seems to understand simple questions. Her husband is at the bedside.  Inpatient Medications    Scheduled Meds: . aspirin EC  81 mg Oral Q breakfast  . atorvastatin  40 mg Oral q1800  . calcium-vitamin D  1 tablet Oral Daily  . chlorhexidine gluconate (MEDLINE KIT)  15 mL Mouth Rinse BID  . enoxaparin (LOVENOX) injection  40 mg Subcutaneous Q24H  . fentaNYL (SUBLIMAZE) injection  50 mcg Intravenous Once  . FLUoxetine  10 mg Oral Daily  . furosemide  20 mg Intravenous Once  . metoprolol tartrate  50 mg Oral BID  . multivitamin  1 tablet Oral BID  . pantoprazole  40 mg Oral Daily  . ticagrelor  90 mg Oral BID  . venlafaxine XR  75 mg Oral Daily   Continuous Infusions: . sodium chloride 50 mL/hr at 02/18/17 1200  . amiodarone Stopped (02/17/17 2217)  . clevidipine    . esmolol 75 mcg/kg/min (02/18/17 1245)  . fentaNYL infusion INTRAVENOUS Stopped (02/16/17 0800)  . phenylephrine (NEO-SYNEPHRINE) Adult infusion Stopped (02/16/17 0800)   PRN Meds: acetaminophen **OR** acetaminophen (TYLENOL) oral liquid 160 mg/5 mL **OR** acetaminophen, fentaNYL, gabapentin, hydrALAZINE, midazolam, senna-docusate   Vital Signs    Vitals:   02/18/17 0801 02/18/17 0825 02/18/17 0950 02/18/17 1251  BP:    (!) 147/69  Pulse: 72 68  64  Resp: (!) 30 (!) 28  (!) 28  Temp:      TempSrc:      SpO2: 94% 97% 98% 98%  Weight:      Height:        Intake/Output Summary (Last 24 hours) at 02/18/17 1308 Last data filed at 02/18/17 1200  Gross per 24 hour  Intake          1702.54 ml  Output              200 ml  Net          1502.54 ml   Filed Weights   02/14/17 2000 02/14/17 2100 02/16/17 2230  Weight: 130  lb 4.7 oz (59.1 kg) 124 lb (56.2 kg) 131 lb 6.3 oz (59.6 kg)    Telemetry    Sinus rhythm with rates in the 60s to 70s - Personally Reviewed  ECG    No new tracings  Physical Exam   GEN: No acute distress.   Neck: No JVD Cardiac: RRR, no murmurs, rubs, or gallops. Left carotid bruit Respiratory: Clear to auscultation bilaterally, except diminished in the right posterior base GI: Soft, nontender, non-distended  MS: No edema; No deformity. Neuro:   right-sided weakness and garbled speech Psych: Flat affect affect   Labs    Chemistry Recent Labs Lab 02/14/17 2030  02/16/17 0830 02/17/17 0232 02/18/17 0215  NA 138  < > 141 141 139  K 4.0  < > 4.3 3.3* 3.6  CL 107  < > 115* 112* 109  CO2 20*  --  21* 21* 21*  GLUCOSE 96  < > 114* 107* 114*  BUN 23*  < > 25* 17 16  CREATININE 0.84  < > 0.65 0.62 0.59  CALCIUM 9.1  --  8.2* 8.5* 8.2*  PROT 6.7  --   --   --   --  ALBUMIN 4.0  --   --   --   --   AST 25  --   --   --   --   ALT 20  --   --   --   --   ALKPHOS 58  --   --   --   --   BILITOT 0.4  --   --   --   --   GFRNONAA >60  --  >60 >60 >60  GFRAA >60  --  >60 >60 >60  ANIONGAP 11  --  _0 < > = values in this interval not displayed.   Hematology Recent Labs Lab 02/16/17 0830 02/17/17 0232 02/18/17 0215  WBC 10.0 10.5 15.9*  RBC 2.91* 3.37* 3.33*  HGB 9.2* 10.4* 10.5*  HCT 29.2* 33.3* 31.5*  MCV 100.3* 98.8 94.6  MCH 31.6 30.9 31.5  MCHC 31.5 31.2 33.3  RDW 14.0 13.6 13.5  PLT 162 156 210    Cardiac Enzymes Recent Labs Lab 02/15/17 0800 02/15/17 1458 02/15/17 2015  TROPONINI <0.03 0.03* 0.03*    Recent Labs Lab 02/14/17 2033  TROPIPOC 0.02     BNPNo results for input(s): BNP, PROBNP in the last 168 hours.   DDimer No results for input(s): DDIMER in the last 168 hours.   Radiology    Dg Chest Port 1 View  Result Date: 02/18/2017 CLINICAL DATA:  Crackles in both lungs this morning on physical examination. EXAM: PORTABLE CHEST 1  VIEW COMPARISON:  Single-view of the chest 02/16/2017. PA and lateral chest and CT chest 04/26/2012. FINDINGS: There is a small to moderate right pleural effusion. Airspace disease is seen in the right lower and right upper lobes. There is also some airspace disease in the medial left lower lobe. No pneumothorax. IMPRESSION: Right much worse than left airspace disease has an appearance most worrisome for pneumonia. Associated small to moderate right pleural effusion is noted. Electronically Signed   By: Inge Rise M.D.   On: 02/18/2017 12:20   Dg Swallowing Func-speech Pathology  Result Date: 02/17/2017 Objective Swallowing Evaluation: Type of Study: MBS-Modified Barium Swallow Study Patient Details Name: Tayona Sarnowski MRN: [id number] Date of Birth: October 10, 1945 Today's Date: 02/17/2017 Time: SLP Start Time (ACUTE ONLY): 1007-SLP Stop Time (ACUTE ONLY): 1022 SLP Time Calculation (min) (ACUTE ONLY): 15 min Past Medical History: Past Medical History: Diagnosis Date . High cholesterol  . Multiple sclerosis ([place])  Past Surgical History: Past Surgical History: Procedure Laterality Date . CORONARY ANGIOPLASTY WITH STENT PLACEMENT   . IR ANGIO INTRA EXTRACRAN SEL COM CAROTID INNOMINATE UNI R MOD SED  02/15/2017 . IR ANGIO VERTEBRAL SEL SUBCLAVIAN INNOMINATE UNI R MOD SED  02/15/2017 . IR INTRAVSC STENT CERV CAROTID W/O EMB-PROT MOD SED INC ANGIO  02/15/2017 . IR PERCUTANEOUS ART THROMBECTOMY/INFUSION INTRACRANIAL INC DIAG ANGIO  02/15/2017 . NECK SURGERY   . RADIOLOGY WITH ANESTHESIA N/A 02/14/2017  Procedure: RADIOLOGY WITH ANESTHESIA;  Surgeon: Radiologist, Medication, MD;  Location: [facility];  Service: Radiology;  Laterality: N/A; . WRIST SURGERY   HPI: Patient's 71 year old female with past medical history significant for multiple sclerosis, high cholesterol and ACDF (age indeterminate) admitted with stuttering symptoms, somewhat resolved and right hand weakness. MRI showed acute confluent infarct involving the left  putamen, caudate body, and corona radiata, tiny cortical infarcts in the left MCA distribution, extensive chronic white matter disease, some combination of patient's multiple sclerosis and chronic microvascular ischemia. Pt underwent revascularization and left ICA angioplasty stent 7/17.  RN reports intubated total of 30 hours.  No Data Recorded Assessment / Plan / Recommendation CHL IP CLINICAL IMPRESSIONS 02/17/2017 Clinical Impression Pt exhibits min-mild oropharyngeal dysphagia. Decreased oral cohesion resulting in premature spill x 1 into the laryngeal vestibule with trace remaining on upper anterior wall post swallow. Flash laryngeal penetration with straw sip thin likely due to increased velocity and volume. Mild intermittent vallecular stasis cleared with spontaneous additional swalllow. Recommend Dys 2 texture given oral deficits/deconditioning, thin liquids, no straws, pills whole in puree, sit upright, small bites/sips. Will continue to follow for safety with recommendations.   SLP Visit Diagnosis Dysphagia, oropharyngeal phase (R13.12) Attention and concentration deficit following -- Frontal lobe and executive function deficit following -- Impact on safety and function Moderate aspiration risk   CHL IP TREATMENT RECOMMENDATION 02/17/2017 Treatment Recommendations Therapy as outlined in treatment plan below   Prognosis 02/17/2017 Prognosis for Safe Diet Advancement Good Barriers to Reach Goals -- Barriers/Prognosis Comment -- CHL IP DIET RECOMMENDATION 02/17/2017 SLP Diet Recommendations Dysphagia 2 (Fine chop) solids;Thin liquid Liquid Administration via Cup;No straw Medication Administration Whole meds with puree Compensations Slow rate;Small sips/bites;Minimize environmental distractions;Multiple dry swallows after each bite/sip Postural Changes Seated upright at 90 degrees   CHL IP OTHER RECOMMENDATIONS 02/17/2017 Recommended Consults -- Oral Care Recommendations Oral care BID Other Recommendations --   CHL  IP FOLLOW UP RECOMMENDATIONS 02/17/2017 Follow up Recommendations Inpatient Rehab   CHL IP FREQUENCY AND DURATION 02/17/2017 Speech Therapy Frequency (ACUTE ONLY) min 2x/week Treatment Duration 2 weeks      CHL IP ORAL PHASE 02/17/2017 Oral Phase Impaired Oral - Pudding Teaspoon -- Oral - Pudding Cup -- Oral - Honey Teaspoon -- Oral - Honey Cup -- Oral - Nectar Teaspoon -- Oral - Nectar Cup Premature spillage Oral - Nectar Straw -- Oral - Thin Teaspoon -- Oral - Thin Cup Holding of bolus Oral - Thin Straw WFL;Weak lingual manipulation Oral - Puree -- Oral - Mech Soft -- Oral - Regular Delayed oral transit Oral - Multi-Consistency WFL Oral - Pill -- Oral Phase - Comment --  CHL IP PHARYNGEAL PHASE 02/17/2017 Pharyngeal Phase Impaired Pharyngeal- Pudding Teaspoon -- Pharyngeal -- Pharyngeal- Pudding Cup -- Pharyngeal -- Pharyngeal- Honey Teaspoon -- Pharyngeal -- Pharyngeal- Honey Cup -- Pharyngeal -- Pharyngeal- Nectar Teaspoon -- Pharyngeal -- Pharyngeal- Nectar Cup Penetration/Aspiration before swallow;Reduced airway/laryngeal closure Pharyngeal Material enters airway, remains ABOVE vocal cords and not ejected out Pharyngeal- Nectar Straw -- Pharyngeal -- Pharyngeal- Thin Teaspoon -- Pharyngeal -- Pharyngeal- Thin Cup Penetration/Aspiration during swallow;Pharyngeal residue - valleculae Pharyngeal Material enters airway, remains ABOVE vocal cords then ejected out Pharyngeal- Thin Straw Penetration/Aspiration during swallow;Pharyngeal residue - valleculae Pharyngeal -- Pharyngeal- Puree -- Pharyngeal -- Pharyngeal- Mechanical Soft -- Pharyngeal -- Pharyngeal- Regular WFL Pharyngeal -- Pharyngeal- Multi-consistency WFL Pharyngeal -- Pharyngeal- Pill -- Pharyngeal -- Pharyngeal Comment --  CHL IP CERVICAL ESOPHAGEAL PHASE 02/17/2017 Cervical Esophageal Phase WFL Pudding Teaspoon -- Pudding Cup -- Honey Teaspoon -- Honey Cup -- Nectar Teaspoon -- Nectar Cup -- Nectar Straw -- Thin Teaspoon -- Thin Cup -- Thin Straw -- Puree  -- Mechanical Soft -- Regular -- Multi-consistency -- Pill -- Cervical Esophageal Comment -- No flowsheet data found. [place] Siren 02/17/2017, 1:26 PM Orbie Pyo Colvin Caroli.Ed CCC-SLP Pager [phone]               Cardiac Studies   TTE  02/16/17  - Left ventricle: The cavity size was normal. There was moderate concentric hypertrophy. Systolic function was vigorous.  The estimated ejection fraction was in the range of 65% to 70%. Wall motion was normal; there were no regional wall motion abnormalities. Doppler parameters are consistent with abnormal left ventricular relaxation (grade 1 diastolic dysfunction). Doppler parameters are consistent with elevated ventricular end-diastolic filling pressure. - Aortic valve: Trileaflet; mildly thickened, mildly calcified leaflets. There was mild stenosis. Mean gradient (S): 11 mm Hg. Peak gradient (S): 17 mm Hg. Valve area (VTI): 1.45 cm^2. Valve area (Vmax): 1.73 cm^2. Valve area (Vmean): 1.44 cm^2. - Mitral valve: Calcified annulus. Mildly thickened leaflets . There was trivial regurgitation. - Left atrium: The atrium was normal in size. - Right ventricle: The cavity size was normal. Wall thickness was normal. Systolic function was normal. - Right atrium: The atrium was normal in size. - Tricuspid valve: There was trivial regurgitation. - Pulmonary arteries: Systolic pressure was within the normal range. - Inferior vena cava: The vessel was normal in size. - Pericardium, extracardiac: There was no pericardial effusion. Impressions: - No cardiac source of emboli was indentified.   Patient Profile     71 y.o. female with history of CAD, cardiac cath/PCI (Dr. Zadie Rhine 2001), hyperlipidemia and multiple sclerosis who was admitted on 02/14/17 for stroke with symptoms of some stuttering and weakness in the right hand that progressed to right-sided weakness. She subsequently developed atrial fibrillation with rapid  ventricular response with rates in the 140s and cardiology was asked to consult.   Assessment & Plan     Acute Stroke -Acute MCA infarct due to left CCA and MCA occlusion, status post mechanical thrombectomy and left ICA stenting which resulted in incomplete revascularization of the occluded left MCA. -Likely due to undiagnosed atrial fibrillation prior to arrival -Management by neurosurgery and neurology stroke team. -Nephrology recommends to continue DAPT for now and can start anticoagulation 5 days post event which would be over the weekend. The patient was placed on Brilinta by Dr. Estanislado Pandy due to high P2Y12 level.   Atrial fibrillation  -No previous diagnosis, however it is suspected that unrecognized atrial fibrillation made contributed to her stroke -Was on IV amiodarone. Now off. Patient is now on metoprolol 50 mg orally twice a day and esmolol drip is being weaned off. -Currently maintaining sinus rhythm in the 60's-70's. Last atrial fib was at 6:30 yesterday morning. -CHA2DS2/VAS Stroke Risk Score is at least 5 (vascular dz, age, stroke (2), female). The patient will need anticoagulation for future stroke risk reduction related to atrial fibrillation when okay with neurology. Recommend Eliquis 5 mg by mouth twice a day. Recommend discontinuation of aspirin in setting of increased bleeding risk with use of Brilinta and anticoagulation.  - Echo showed LVEF 65-70%, moderate concentric LVH, grade 1 diastolic dysfunction, trivial MR   Hyperlipidemia -LDL74 on 02/15/17. LDL goal < 70. Changed from pravastatin 10 mg to Lipitor 40 mg daily   Signed, Daune Perch, NP  02/18/2017, 1:08 PM    I have seen and examined the patient along with Daune Perch, NP.  I have reviewed the chart, notes and new data.  I agree with NP's note.  Key new complaints: denies CV complaints Key examination changes: normal CV exam except elevated BP, now off esmolol Key new findings / data: arrhythmia free  for almost 24 hours  PLAN: Initiate anticoagulation as soon as safe per neurology. Agree with Dr. Francesca Oman concern regarding DOAC and Brilinta simultaneously. When anticoagulation is started, should stop ASA.  Sanda Klein, MD, [place] [phone] 02/18/2017, 5:44 PM

## 2017-02-18 NOTE — Clinical Social Work Note (Signed)
Clinical Social Work Assessment  Patient Details  Name: Kaitlin [name] MRN: [id number] Date of Birth: 02-16-1946  Date of referral:  02/18/17               Reason for consult:  Facility Placement                Permission sought to share information with:  Facility Medical sales representative, Family Supports Permission granted to share information::  No  Name::     Mining engineer::  SNFs  Relationship::  Spouse  Contact Information:     Housing/Transportation Living arrangements for the past 2 months:  Single Family Home Source of Information:  Spouse Patient Interpreter Needed:  None Criminal Activity/Legal Involvement Pertinent to Current Situation/Hospitalization:  No - Comment as needed Significant Relationships:  Spouse Lives with:  Spouse Do you feel safe going back to the place where you live?  No Need for family participation in patient care:  Yes (Comment)  Care giving concerns:  CSW received consult for possible SNF placement at time of discharge. CSW spoke with patient regarding PT recommendation of SNF placement at time of discharge if [facility] is unable to accept. Patient's spouse reported that patient's spouse is currently unable to care for patient at their home given patient's current physical needs and fall risk. Patient's spouse expressed understanding of PT recommendation and is agreeable to SNF placement at time of discharge if [facility] is unable to accept patient. CSW to continue to follow and assist with discharge planning needs.   Social Worker assessment / plan:  CSW spoke with patient's son concerning possibility of rehab at [facility] before returning home.  Employment status:  Retired Database administrator PT Recommendations:  Inpatient Rehab Consult Information / Referral to community resources:  Skilled Nursing Facility  Patient/Family's Response to care:  Patient's spouse recognizes need for rehab before returning home and is willing to consider skilled  nursing.   Patient/Family's Understanding of and Emotional Response to Diagnosis, Current Treatment, and Prognosis:  Patient/family is realistic regarding therapy needs and expressed being hopeful for SNF placement. Patient's spouse expressed understanding of CSW role and discharge process. No questions/concerns about plan or treatment.    Emotional Assessment Appearance:  Appears stated age Attitude/Demeanor/Rapport:  Unable to Assess Affect (typically observed):  Unable to Assess Orientation:  Oriented to Self Alcohol / Substance use:  Alcohol Use Psych involvement (Current and /or in the community):  No (Comment)  Discharge Needs  Concerns to be addressed:  Care Coordination Readmission within the last 30 days:  No Current discharge risk:  None Barriers to Discharge:  Continued Medical Work up   [employer], LCSWA 02/18/2017, 4:20 PM

## 2017-02-18 NOTE — Progress Notes (Addendum)
STROKE TEAM PROGRESS NOTE   SUBJECTIVE (INTERVAL HISTORY) Her husband is at the bedside. The pt has some Respiratory distress over night, was put on a nonrebreather. However, patient not able to tolerate, the nonrebreather was changed to high flow oxygen mask. Patient is working still has shortness of breath, and not able to speak one sentence with one breath.     OBJECTIVE Temp:  [97.9 F (36.6 C)-98.7 F (37.1 C)] 98.7 F (37.1 C) (07/20 1600) Pulse Rate:  [62-79] 68 (07/20 1630) Cardiac Rhythm: Normal sinus rhythm (07/20 0800) Resp:  [20-40] 26 (07/20 1630) BP: (129-194)/(54-123) 156/68 (07/20 1630) SpO2:  [83 %-100 %] 94 % (07/20 1630) FiO2 (%):  [50 %-55 %] 50 % (07/20 0825)  CBC:  Recent Labs Lab 02/14/17 2030  02/15/17 0558  02/17/17 0232 02/18/17 0215  WBC 7.6  --  10.6*  < > 10.5 15.9*  NEUTROABS 4.4  --  8.9*  --   --   --   HGB 12.2  < > 9.8*  < > 10.4* 10.5*  HCT 37.3  < > 29.9*  < > 33.3* 31.5*  MCV 96.6  --  97.1  < > 98.8 94.6  PLT 203  --  218  < > 156 210  < > = values in this interval not displayed.  Basic Metabolic Panel:   Recent Labs Lab 02/16/17 0830 02/17/17 0232 02/18/17 0215  NA 141 141 139  K 4.3 3.3* 3.6  CL 115* 112* 109  CO2 21* 21* 21*  GLUCOSE 114* 107* 114*  BUN 25* 17 16  CREATININE 0.65 0.62 0.59  CALCIUM 8.2* 8.5* 8.2*  MG 2.1  --  2.1    Lipid Panel:     Component Value Date/Time   CHOL 173 02/15/2017 0420   TRIG 145 02/15/2017 0420   HDL 70 02/15/2017 0420   CHOLHDL 2.5 02/15/2017 0420   VLDL 29 02/15/2017 0420   LDLCALC 74 02/15/2017 0420   HgbA1c:  Lab Results  Component Value Date   HGBA1C 5.2 02/15/2017   Urine Drug Screen:     Component Value Date/Time   LABOPIA NONE DETECTED 02/14/2017 2124   COCAINSCRNUR NONE DETECTED 02/14/2017 2124   LABBENZ NONE DETECTED 02/14/2017 2124   AMPHETMU NONE DETECTED 02/14/2017 2124   THCU NONE DETECTED 02/14/2017 2124   LABBARB NONE DETECTED 02/14/2017 2124     Alcohol Level     Component Value Date/Time   ETH 152 (H) 02/14/2017 2030    IMAGING I have personally reviewed the radiological images below and agree with the radiology interpretations.  Ct Angio Head/Neck W and Wo Contrast 02/14/2017 IMPRESSION: CT head: 1. Focal hypodensity in left insula and left posterior putamen compatible with infarction. 2. Hyperdensity and distal left M1. 3. ASPECTS is 8 CTA neck: 1. Occlusion of the left common and internal carotid artery from the arch to the distal cavernous segment. 2. Patent right common carotid system and vertebral arteries without dissection, aneurysm, or significant stenosis. 3. Moderate calcific atherosclerosis of the aortic arch 4. Centrilobular emphysema of lung apices. CTA head: 1. Patent paraclinoid and terminal segments of left internal carotid artery and minimal flow in proximal left M1 likely retrograde via a tiny left posterior communicating artery. 2. Occlusion of mid and distal left M1 with poor left MCA collateralization. 3. Left A1 occlusion. Bilateral A2 are patent with the left likely perfused via the anterior communicating artery. Symmetric distal ACA circulation. 4. Patent right MCA and posterior circulation. 5.  No intracranial aneurysm identified.   Ct Head Wo Contrast 02/15/2017 IMPRESSION: 1. Contrast staining versus blood products LEFT basal ganglia to LEFT temporal lobe. Recommend 6-12 hour follow-up CT. 2. Moderate to severe chronic small vessel ischemic disease. T  Mr Brain 13 Contrast Mr Maxine Glenn Head/brain Wo Cm 02/15/2017 IMPRESSION: 1. Acute confluent infarct involving the left putamen, caudate body, and corona radiata. 2 tiny cortical infarcts in the left MCA distribution. 2. Extensive chronic white matter disease, some combination of patient's multiple sclerosis and chronic microvascular ischemia. 3. Continued good patency of recently re- cannulized left ICA and M1 segment. 4. Mild left M1 segment irregularity. Moderate  left M2 inferior division stenosis. 5. Moderate to advanced stenosis of the left V4 segment.   Ct Cerebral Perfusion W Contrast 02/14/2017 IMPRESSION: 1. Left MCA distribution perfusion anomaly. Ischemic penumbra of 33 cc calculated by mismatch volume. 2. 0 cc infarct core (CBF <30%) by perfusion. This does not included the infarct core volume of the ASPECT 8 infarct on non contrast CT of the head involving the part of the left insula and putamen as visible infarct on noncontrast CT can be pseudo normalized on perfusion.   Portable Chest Xray 02/15/2017 IMPRESSION: 1. ACDF of the lower cervical spine. 2. Satisfactory support line and tube positions. 3. Atelectasis and/or scarring at the left lung base. 4. Aortic atherosclerosis.  Ct Head Code Stroke W/o Cm 02/14/2017 IMPRESSION: CT head: 1. Focal hypodensity in left insula and left posterior putamen compatible with infarction. 2. Hyperdensity and distal left M1. 3. ASPECTS is 8 CTA neck: 1. Occlusion of the left common and internal carotid artery from the arch to the distal cavernous segment. 2. Patent right common carotid system and vertebral arteries without dissection, aneurysm, or significant stenosis. 3. Moderate calcific atherosclerosis of the aortic arch 4. Centrilobular emphysema of lung apices. CTA head: 1. Patent paraclinoid and terminal segments of left internal carotid artery and minimal flow in proximal left M1 likely retrograde via a tiny left posterior communicating artery. 2. Occlusion of mid and distal left M1 with poor left MCA collateralization. 3. Left A1 occlusion. Bilateral A2 are patent with the left likely perfused via the anterior communicating artery. Symmetric distal ACA circulation. 4. Patent right MCA and posterior circulation. 5. No intracranial aneurysm identified.    TTE - Left ventricle: The cavity size was normal. There was moderate   concentric hypertrophy. Systolic function was vigorous. The   estimated ejection  fraction was in the range of 65% to 70%. Wall   motion was normal; there were no regional wall motion   abnormalities. Doppler parameters are consistent with abnormal   left ventricular relaxation (grade 1 diastolic dysfunction).   Doppler parameters are consistent with elevated ventricular   end-diastolic filling pressure. - Aortic valve: Trileaflet; mildly thickened, mildly calcified   leaflets. There was mild stenosis. Mean gradient (S): 11 mm Hg.   Peak gradient (S): 17 mm Hg. Valve area (VTI): 1.45 cm^2. Valve   area (Vmax): 1.73 cm^2. Valve area (Vmean): 1.44 cm^2. - Mitral valve: Calcified annulus. Mildly thickened leaflets .   There was trivial regurgitation. - Left atrium: The atrium was normal in size. - Right ventricle: The cavity size was normal. Wall thickness was   normal. Systolic function was normal. - Right atrium: The atrium was normal in size. - Tricuspid valve: There was trivial regurgitation. - Pulmonary arteries: Systolic pressure was within the normal   range. - Inferior vena cava: The vessel was normal in size. -  Pericardium, extracardiac: There was no pericardial effusion. Impressions: - No cardiac source of emboli was indentified.  LE venous doppler There is no evidence of deep or superficial vein thrombosis involving the right and left lower extremities. All visualized vessels appear patent and compressible.  Incidental findings are consistent with a Baker's Cyst measuring 1.2 cm high by 3.5 cm wide by 3.1 cm long on the right.  Dg Chest Port 1 View 02/18/2017 IMPRESSION: Right much worse than left airspace disease has an appearance most worrisome for pneumonia. Associated small to moderate right pleural effusion is noted.    PHYSICAL EXAM  Temp:  [97.9 F (36.6 C)-98.7 F (37.1 C)] 98.7 F (37.1 C) (07/20 1600) Pulse Rate:  [62-79] 68 (07/20 1630) Resp:  [20-40] 26 (07/20 1630) BP: (129-194)/(54-123) 156/68 (07/20 1630) SpO2:  [83 %-100 %] 94 %  (07/20 1630) FiO2 (%):  [50 %-55 %] 50 % (07/20 0825)  General - Well nourished, well developed, intubated off sedation.  Ophthalmologic - Fundi not visualized due to noncooperation.  Cardiovascular - Regular rate and rhythm.  Neuro - intubated off sedation. Eyes open, follows simple commands centrally and peripherally. Left gaze preference, but able to cross midline. Blinking to visual threat bilaterally. PERRL. Facial symmetry not able to assess due to ET tube. RUE 0/5, RLE 3/5. LUE at least 4/5, LLE 4/5. DTR 1+ and right positive babinski. Sensation, coordination and gait not tested.   ASSESSMENT/PLAN [name] is a 71 y.o. female with a  20 year history of MS, hyperlipidemia, and CAD with stent in 2000, who presented as a Code Stroke for right sided weakness and garbled speech. She did not receive IV t-PA due to arriving outside of the treatment window.   Stroke:  Acute MCA infarct due to left CCA/ICA and MCA occlusion, s/p mechanical thrombectomy and left ICA stenting in the setting of newly diagnosed afib  Resultant  Intubated, right hemiplegia  CT head: Contrast staining versus blood products LEFT basal ganglia to LEFT temporal lobe.   CTA head and neck: left CCA origin occlusion, A1 and distal M1 occlusion  MRI head: Acute confluent infarct involving the left putamen, caudate body, and corona radiata, as well as tiny cortical infarcts in the left MCA distribution.    MRA head: Mild left M1 segment irregularity, moderate left M2 inferior division stenosis.   2D Echo  EF 65-70%  LE venous doppler no PFO but baker's cyst  LDL 74  HgbA1c 5.2  lovenox for VTE prophylaxis DIET DYS 2 Room service appropriate? Yes; Fluid consistency: Thin  aspirin 81 mg daily prior to admission, now on aspirin 81 and brilinta 90 mg bid due to high P2Y12 level. Cardiology recommend eliquis with plavix once pt stable for anticoagulation. I discussed with Dr. Corliss Skains and he is in  agreement.   Patient counseled to be compliant with her antithrombotic medications  Ongoing aggressive stroke risk factor management  Therapy recommendations:  pending  Disposition:  Pending  PAF with RVR  new diagnosis likely the cause for stroke  Off amiodarone drip and still on esmolol drip  Cardiology on board  Put on metoprolol for rate control  Continue DAPT for now and will need to start anticoagulation in 5 days (over the weekend). Due to high risk of bleeding with eliquis and brilinta, cardiology recommended eliquis with plavix. Dr. Corliss Skains is in agreement.  Left CCA/ICA occlusion s/p thrombectomy  Likely due to undiagnosed afib PTA  S/p left ICA stent  Respiratory distress  Likely due to aspiration pneumonia  CXR concerning for pneumonia  CCM on board  Put on Levaquin  On high flow oxygen mask  Continue ICU monitoring  Hypertension  Stable  Permissive hypertension (OK if <180/105) and gradually normalized within 5-7 days.  Long-term blood pressure goal normotensive  Hypotension on admission and postprocedure resolved  Hyperlipidemia  Home meds: none  LDL 74, goal < 70  Add atorvastatin 40mg  PO daily  Continue statin at discharge  Dysphagia   dys 2 diet with thin liquid  Speech is following  Other Stroke Risk Factors  Advanced age  ETOH use, advised to drink no more than 1 drink(s) a day  Coronary artery disease  Other Active Problems  Sinus bradycardia - resolved  Multiple sclerosis: follows neurology in [place], was on Betaseron, no flare up, took off 11/2015 due to elevated LFT  Right Baker's cyst  Hospital day # 4  This patient is critically ill due to CCA occlusion, MCA occlusion, s/p mechanical thrombectomy and ICA stent, afib RVR, dysphagia, respiratory distress and at significant risk of neurological worsening, death form recurrent stroke, hemorrhagic conversion, seizure, cerebral edema, heart failure,  aspiration pneumonia, and respiratory failure. This patient's care requires constant monitoring of vital signs, hemodynamics, respiratory and cardiac monitoring, review of multiple databases, neurological assessment, discussion with family, other specialists and medical decision making of high complexity. I spent 45 minutes of neurocritical care time in the care of this patient. I had long discussion with husband at bedside, updated pt current condition, treatment plan and potential prognosis. He expressed understanding and appreciation.   Marvel Plan, MD PhD Stroke Neurology 02/18/2017 5:16 PM   To contact Stroke Continuity provider, please refer to [email or web address]. After hours, contact General Neurology

## 2017-02-18 NOTE — Progress Notes (Signed)
CCM NP aware pt has not produced sputum to be collected for culture. Levaquin infusing.

## 2017-02-18 NOTE — Progress Notes (Signed)
Physical Therapy Treatment Patient Details Name: Kaitlin [name] MRN: [id number] DOB: Jun 29, 1946 Today's Date: 02/18/2017    History of Present Illness Pt is a 71 y/o female with a PMH significant for multiple sclerosis. She presented to [facility] 7/17 with R-sided weakness and aphasia. CT revealed L MCA occlusion. She is now s/p R vertebral arteriogram with complete revascularization of occluded L MCA and stent assisted angioplasty of proximal L ICA occlusion. She was intubated 7/17-7/18.    PT Comments    Pt progressing towards physical therapy goals. She was more engaged in session today, and demonstrated increased initiation of movement overall. Will attempt to progress ambulation next session. Will continue to follow.    Follow Up Recommendations  CIR;Supervision/Assistance - 24 hour     Equipment Recommendations   (TBD by next venue of care)    Recommendations for Other Services Rehab consult     Precautions / Restrictions Precautions Precautions: Fall Precaution Comments: R side inattention Restrictions Weight Bearing Restrictions: No    Mobility  Bed Mobility Overal bed mobility: Needs Assistance Bed Mobility: Supine to Sit     Supine to sit: Mod assist;+2 for physical assistance;+2 for safety/equipment;HOB elevated     General bed mobility comments: Pt with increased initiation of movement towards EOB with cues. Assist provided for RLE advancement and general trunk support with raise to sitting. Bed pad used to assist in scooting fully to EOB.   Transfers Overall transfer level: Needs assistance Equipment used: 2 person hand held assist Transfers: Sit to/from [employer] Sit to Stand: Mod assist;+2 physical assistance;+2 safety/equipment   Squat pivot transfers: Mod assist;+2 physical assistance;+2 safety/equipment     General transfer comment: Bed pad used to assist in squat pivot bed>chair. Pt able to advance LE's without assist, however noted  continued significant weakness in RLE.   Ambulation/Gait             General Gait Details: Unable at this time   Stairs            Wheelchair Mobility    Modified Rankin (Stroke Patients Only) Modified Rankin (Stroke Patients Only) Pre-Morbid Rankin Score: No symptoms Modified Rankin: Severe disability     Balance Overall balance assessment: Needs assistance Sitting-balance support: Feet supported;No upper extremity supported Sitting balance-Leahy Scale: Zero Sitting balance - Comments: Max assist at times to maintain sitting balance Postural control: Posterior lean                                  Cognition Arousal/Alertness: Awake/alert Behavior During Therapy: Flat affect Overall Cognitive Status: Impaired/Different from baseline Area of Impairment: Orientation;Attention;Memory;Following commands;Safety/judgement;Awareness;Problem solving                 Orientation Level: Disoriented to;Place;Time;Situation Current Attention Level: Sustained Memory: Decreased short-term memory;Decreased recall of precautions Following Commands: Follows one step commands inconsistently;Follows one step commands with increased time Safety/Judgement: Decreased awareness of safety;Decreased awareness of deficits Awareness: Intellectual Problem Solving: Slow processing;Decreased initiation;Difficulty sequencing;Requires verbal cues;Requires tactile cues        Exercises      General Comments        Pertinent Vitals/Pain Pain Assessment: Faces Faces Pain Scale: Hurts a little bit Pain Location: General grimacing with movement Pain Descriptors / Indicators: Discomfort    Home Living                      Prior Function  PT Goals (current goals can now be found in the care plan section) Acute Rehab PT Goals Patient Stated Goal: Family goal is to return to PLOF PT Goal Formulation: With family Time For Goal Achievement:  03/03/17 Potential to Achieve Goals: Good Progress towards PT goals: Progressing toward goals    Frequency    Min 3X/week      PT Plan Current plan remains appropriate    Co-evaluation              AM-PAC PT "6 Clicks" Daily Activity  Outcome Measure  Difficulty turning over in bed (including adjusting bedclothes, sheets and blankets)?: Total Difficulty moving from lying on back to sitting on the side of the bed? : Total Difficulty sitting down on and standing up from a chair with arms (e.g., wheelchair, bedside commode, etc,.)?: Total Help needed moving to and from a bed to chair (including a wheelchair)?: Total Help needed walking in hospital room?: Total Help needed climbing 3-5 steps with a railing? : Total 6 Click Score: 6    End of Session Equipment Utilized During Treatment: Gait belt;Oxygen Activity Tolerance: Patient limited by fatigue (weakness; cognition) Patient left: in chair;with call bell/phone within reach;with chair alarm set;with family/visitor present;with nursing/sitter in room Nurse Communication: Mobility status PT Visit Diagnosis: Unsteadiness on feet (R26.81);Hemiplegia and hemiparesis Hemiplegia - Right/Left: Right Hemiplegia - dominant/non-dominant: Non-dominant Hemiplegia - caused by: Cerebral infarction     Time: 1610-9604 PT Time Calculation (min) (ACUTE ONLY): 18 min  Charges:  $Therapeutic Activity: 8-22 mins                    G Codes:       Conni Slipper, PT, DPT Acute Rehabilitation Services Pager: [phone]    Marylynn Pearson 02/18/2017, 1:45 PM

## 2017-02-18 NOTE — Progress Notes (Signed)
\  Pharmacy Antibiotic Note Kaitlin [name] is a 71 y.o. female admitted on 02/14/2017. Now with concern for PNA.   Pharmacy has been consulted for Levaquin dosing in setting of PCN allergy.  Plan: 1. Levaquin 750 mg IV every 24 hours 2. Will follow peripherally   Height: 5\' 2"  (157.5 cm) Weight: 131 lb 6.3 oz (59.6 kg) IBW/kg (Calculated) : 50.1  Temp (24hrs), Avg:98.5 F (36.9 C), Min:97.9 F (36.6 C), Max:99 F (37.2 C)   Recent Labs Lab 02/14/17 2030 02/14/17 2034 02/15/17 0558 02/16/17 0830 02/17/17 0232 02/18/17 0215  WBC 7.6  --  10.6* 10.0 10.5 15.9*  CREATININE 0.84 1.00  --  0.65 0.62 0.59    Estimated Creatinine Clearance: 49.5 mL/min (by C-G formula based on SCr of 0.59 mg/dL).    Allergies  Allergen Reactions  . Penicillins      Thank you for allowing pharmacy to be a part of this patient's care.  Sheron Nightingale 02/18/2017 2:00 PM

## 2017-02-19 DIAGNOSIS — I639 Cerebral infarction, unspecified: Secondary | ICD-10-CM

## 2017-02-19 DIAGNOSIS — I63412 Cerebral infarction due to embolism of left middle cerebral artery: Secondary | ICD-10-CM

## 2017-02-19 LAB — BASIC METABOLIC PANEL
ANION GAP: 9 (ref 5–15)
BUN: 14 mg/dL (ref 6–20)
CHLORIDE: 104 mmol/L (ref 101–111)
CO2: 21 mmol/L — AB (ref 22–32)
Calcium: 8.1 mg/dL — ABNORMAL LOW (ref 8.9–10.3)
Creatinine, Ser: 0.48 mg/dL (ref 0.44–1.00)
GFR calc Af Amer: >60 mL/min (ref >60)
GLUCOSE: 111 mg/dL — AB (ref 65–99)
POTASSIUM: 2.8 mmol/L — AB (ref 3.5–5.1)
Sodium: 134 mmol/L — ABNORMAL LOW (ref 135–145)

## 2017-02-19 LAB — CBC
HEMATOCRIT: 31 % — AB (ref 36.0–46.0)
HEMOGLOBIN: 10.3 g/dL — AB (ref 12.0–15.0)
MCH: 31.2 pg (ref 26.0–34.0)
MCHC: 33.2 g/dL (ref 30.0–36.0)
MCV: 93.9 fL (ref 78.0–100.0)
Platelets: 191 10*3/uL (ref 150–400)
RBC: 3.3 MIL/uL — ABNORMAL LOW (ref 3.87–5.11)
RDW: 13.1 % (ref 11.5–15.5)
WBC: 12 10*3/uL — ABNORMAL HIGH (ref 4.0–10.5)

## 2017-02-19 MED ORDER — DIPHENHYDRAMINE HCL 50 MG/ML IJ SOLN
25.0000 mg | Freq: Once | INTRAMUSCULAR | Status: AC
  Administered 2017-02-19: 25 mg via INTRAVENOUS

## 2017-02-19 MED ORDER — DIPHENHYDRAMINE HCL 25 MG PO CAPS: 25.0000 mg | ORAL_CAPSULE | Freq: Once | ORAL | Status: DC

## 2017-02-19 MED ORDER — DIPHENHYDRAMINE HCL 50 MG/ML IJ SOLN
INTRAMUSCULAR | Status: AC
  Filled 2017-02-19: qty 1

## 2017-02-19 MED ORDER — AMIODARONE HCL 200 MG PO TABS
200.0000 mg | ORAL_TABLET | Freq: Every day | ORAL | Status: DC
  Administered 2017-02-19 – 2017-02-22 (×4): 200 mg via ORAL
  Filled 2017-02-19 (×4): qty 1

## 2017-02-19 MED ORDER — QUETIAPINE FUMARATE 25 MG PO TABS
50.0000 mg | ORAL_TABLET | Freq: Every evening | ORAL | Status: DC | PRN
  Administered 2017-02-19 – 2017-02-20 (×2): 50 mg via ORAL
  Filled 2017-02-19 (×2): qty 2

## 2017-02-19 MED ORDER — APIXABAN 5 MG PO TABS
5.0000 mg | ORAL_TABLET | Freq: Two times a day (BID) | ORAL | Status: DC
  Administered 2017-02-19: 5 mg via ORAL
  Filled 2017-02-19 (×2): qty 1

## 2017-02-19 MED ORDER — ENOXAPARIN SODIUM 60 MG/0.6ML ~~LOC~~ SOLN
1.0000 mg/kg | Freq: Two times a day (BID) | SUBCUTANEOUS | Status: DC
  Administered 2017-02-20: 60 mg via SUBCUTANEOUS
  Filled 2017-02-19 (×2): qty 0.6

## 2017-02-19 MED ORDER — HALOPERIDOL LACTATE 5 MG/ML IJ SOLN: 1.0000 mg | Freq: Every evening | INTRAMUSCULAR | Status: DC | PRN

## 2017-02-19 MED ORDER — CLOPIDOGREL BISULFATE 75 MG PO TABS
75.0000 mg | ORAL_TABLET | Freq: Every day | ORAL | Status: DC
  Administered 2017-02-20 – 2017-02-22 (×3): 75 mg via ORAL
  Filled 2017-02-19 (×4): qty 1

## 2017-02-19 MED ORDER — DIPHENHYDRAMINE HCL 50 MG/ML IJ SOLN
25.0000 mg | Freq: Once | INTRAMUSCULAR | Status: AC
  Administered 2017-02-19: 25 mg via INTRAVENOUS
  Filled 2017-02-19: qty 1

## 2017-02-19 NOTE — Progress Notes (Signed)
ANTICOAGULATION CONSULT NOTE - Initial Consult  Pharmacy Consult for apixaban Indication: stroke  Allergies  Allergen Reactions  . Penicillins     Patient Measurements: Height: 5\' 2"  (157.5 cm) Weight: 131 lb 6.3 oz (59.6 kg) IBW/kg (Calculated) : 50.1  Vital Signs: Temp: 98.7 F (37.1 C) (07/21 0726) Temp Source: Oral (07/21 0726) BP: 144/68 (07/21 0800) Pulse Rate: 73 (07/21 0800)  Labs:  Recent Labs  02/17/17 0232 02/18/17 0215 02/19/17 0228  HGB 10.4* 10.5* 10.3*  HCT 33.3* 31.5* 31.0*  PLT 156 210 191  CREATININE 0.62 0.59 0.48    Estimated Creatinine Clearance: 49.5 mL/min (by C-G formula based on SCr of 0.48 mg/dL).  Assessment: Acute MCA infarct due to left CCA/ICA and MCA occlusion, s/p mechanical thrombectomy and left ICA stenting in the setting of newly diagnosed afib   On sq enox for ppx  Stroke ok full dose ac  Plan:  Dc enox Start apixaban at 1500 5 mg bid Monitor for bleeding  Isaac Bliss, PharmD, BCPS, BCCCP Clinical Pharmacist Clinical phone for 02/19/2017 from 7a-3:30p: [phone] If after 3:30p, please call main pharmacy at: [phone] 02/19/2017 10:47 AM

## 2017-02-19 NOTE — Progress Notes (Signed)
Progress Note  Patient Name: Kaitlin [name] Date of Encounter: 02/19/2017  Primary Cardiologist: Dr. Clovia Cuff  Subjective   Pt with CAD - s/p stenting on Monday . Presented with a CVA,  Found to have PAF  .  Inpatient Medications    Scheduled Meds: . apixaban  5 mg Oral BID  . atorvastatin  40 mg Oral q1800  . calcium-vitamin D  1 tablet Oral Daily  . chlorhexidine gluconate (MEDLINE KIT)  15 mL Mouth Rinse BID  . clopidogrel  75 mg Oral Daily  . fentaNYL (SUBLIMAZE) injection  50 mcg Intravenous Once  . FLUoxetine  10 mg Oral Daily  . metoprolol tartrate  50 mg Oral BID  . multivitamin  1 tablet Oral BID  . pantoprazole  40 mg Oral Daily  . venlafaxine XR  75 mg Oral Daily   Continuous Infusions: . sodium chloride 50 mL/hr at 02/19/17 0700  . amiodarone Stopped (02/17/17 2217)  . clevidipine    . esmolol Stopped (02/18/17 1634)  . fentaNYL infusion INTRAVENOUS Stopped (02/16/17 0800)  . levofloxacin (LEVAQUIN) IV Stopped (02/18/17 1640)  . phenylephrine (NEO-SYNEPHRINE) Adult infusion Stopped (02/16/17 0800)   PRN Meds: acetaminophen **OR** acetaminophen (TYLENOL) oral liquid 160 mg/5 mL **OR** acetaminophen, fentaNYL, gabapentin, haloperidol lactate, hydrALAZINE, midazolam, senna-docusate   Vital Signs    Vitals:   02/19/17 0726 02/19/17 0755 02/19/17 0800 02/19/17 1131  BP:  (!) 161/60 (!) 144/68 (!) 144/61  Pulse:  73 73 62  Resp:   18 (!) 21  Temp: 98.7 F (37.1 C)   97.9 F (36.6 C)  TempSrc: Oral   Oral  SpO2:   97% 98%  Weight:      Height:        Intake/Output Summary (Last 24 hours) at 02/19/17 1204 Last data filed at 02/19/17 0853  Gross per 24 hour  Intake           1888.6 ml  Output             1530 ml  Net            358.6 ml   Filed Weights   02/14/17 2000 02/14/17 2100 02/16/17 2230  Weight: 130 lb 4.7 oz (59.1 kg) 124 lb (56.2 kg) 131 lb 6.3 oz (59.6 kg)    Telemetry    NSR  - Personally Reviewed  ECG     NSR  - Personally  Reviewed  Physical Exam   GEN: No acute distress.  , s/p CVA  Neck: No JVD Cardiac: RRR, no murmurs, rubs, or gallops.  Respiratory: Clear to auscultation bilaterally. GI: Soft, nontender, non-distended  MS: No edema; No deformity. Neuro:  Nonfocal  Psych: Normal affect   Labs    Chemistry Recent Labs Lab 02/14/17 2030  02/17/17 0232 02/18/17 0215 02/19/17 0228  NA 138  < > 141 139 134*  K 4.0  < > 3.3* 3.6 2.8*  CL 107  < > 112* 109 104  CO2 20*  < > 21* 21* 21*  GLUCOSE 96  < > 107* 114* 111*  BUN 23*  < > 17 16 14   CREATININE 0.84  < > 0.62 0.59 0.48  CALCIUM 9.1  < > 8.5* 8.2* 8.1*  PROT 6.7  --   --   --   --   ALBUMIN 4.0  --   --   --   --   AST 25  --   --   --   --  ALT 20  --   --   --   --   ALKPHOS 58  --   --   --   --   BILITOT 0.4  --   --   --   --   GFRNONAA >60  < > >60 >60 >60  GFRAA >60  < > >60 >60 >60  ANIONGAP 11  < > 8 9 9   < > = values in this interval not displayed.   Hematology Recent Labs Lab 02/17/17 0232 02/18/17 0215 02/19/17 0228  WBC 10.5 15.9* 12.0*  RBC 3.37* 3.33* 3.30*  HGB 10.4* 10.5* 10.3*  HCT 33.3* 31.5* 31.0*  MCV 98.8 94.6 93.9  MCH 30.9 31.5 31.2  MCHC 31.2 33.3 33.2  RDW 13.6 13.5 13.1  PLT 156 210 191    Cardiac Enzymes Recent Labs Lab 02/15/17 0800 02/15/17 1458 02/15/17 2015  TROPONINI <0.03 0.03* 0.03*    Recent Labs Lab 02/14/17 2033  TROPIPOC 0.02     BNPNo results for input(s): BNP, PROBNP in the last 168 hours.   DDimer No results for input(s): DDIMER in the last 168 hours.   Radiology    Dg Chest Port 1 View  Result Date: 02/18/2017 CLINICAL DATA:  Crackles in both lungs this morning on physical examination. EXAM: PORTABLE CHEST 1 VIEW COMPARISON:  Single-view of the chest 02/16/2017. PA and lateral chest and CT chest 04/26/2012. FINDINGS: There is a small to moderate right pleural effusion. Airspace disease is seen in the right lower and right upper lobes. There is also some  airspace disease in the medial left lower lobe. No pneumothorax. IMPRESSION: Right much worse than left airspace disease has an appearance most worrisome for pneumonia. Associated small to moderate right pleural effusion is noted. Electronically Signed   By: Inge Rise M.D.   On: 02/18/2017 12:20    Cardiac Studies     Patient Profile     71 y.o. female with CVA , found to have PAF .   Assessment & Plan    1. Paroxysmal atrial fib:   Is in NSR currently . Neuro has started Eliquis tonight. Will transition amio to PO   2. CAD :   Continue plavix, metoprolol    Signed, Mertie Moores, MD  02/19/2017, 12:04 PM

## 2017-02-19 NOTE — Progress Notes (Signed)
Called regarding malar rash following dose of amiodarone, apixaban, statin. No signs of oral involvement or airway compromise per nursing. She had been on both atorvastatin and amiodarone prior without reaction, so I suspect drug reaction to eliquis. Given this, I would hold this and change to lovenox pending stroke team evaluation. Will give benadryl 25mg  IV x 1.  Ritta Slot, MD [facility] [phone]  If 7pm- 7am, please page neurology on call as listed in AMION.

## 2017-02-19 NOTE — Progress Notes (Signed)
STROKE TEAM PROGRESS NOTE   SUBJECTIVE (INTERVAL HISTORY) Her husband is at the bedside. The pt  Is tolerating high flow oxygen mask. Patient is working still has shortness of breath, and not able to speak one sentence with one breath.  Was agitated last night and has a sitter at bedside   OBJECTIVE Temp:  [97.9 F (36.6 C)-99.1 F (37.3 C)] 97.9 F (36.6 C) (07/21 1131) Pulse Rate:  [58-86] 65 (07/21 1200) Cardiac Rhythm: Normal sinus rhythm (07/21 0400) Resp:  [18-37] 23 (07/21 1200) BP: (133-184)/(51-95) 145/61 (07/21 1200) SpO2:  [93 %-99 %] 97 % (07/21 1200)  CBC:  Recent Labs Lab 02/14/17 2030  02/15/17 0558  02/18/17 0215 02/19/17 0228  WBC 7.6  --  10.6*  < > 15.9* 12.0*  NEUTROABS 4.4  --  8.9*  --   --   --   HGB 12.2  < > 9.8*  < > 10.5* 10.3*  HCT 37.3  < > 29.9*  < > 31.5* 31.0*  MCV 96.6  --  97.1  < > 94.6 93.9  PLT 203  --  218  < > 210 191  < > = values in this interval not displayed.  Basic Metabolic Panel:   Recent Labs Lab 02/16/17 0830  02/18/17 0215 02/19/17 0228  NA 141  < > 139 134*  K 4.3  < > 3.6 2.8*  CL 115*  < > 109 104  CO2 21*  < > 21* 21*  GLUCOSE 114*  < > 114* 111*  BUN 25*  < > 16 14  CREATININE 0.65  < > 0.59 0.48  CALCIUM 8.2*  < > 8.2* 8.1*  MG 2.1  --  2.1  --   < > = values in this interval not displayed.  Lipid Panel:     Component Value Date/Time   CHOL 173 02/15/2017 0420   TRIG 145 02/15/2017 0420   HDL 70 02/15/2017 0420   CHOLHDL 2.5 02/15/2017 0420   VLDL 29 02/15/2017 0420   LDLCALC 74 02/15/2017 0420   HgbA1c:  Lab Results  Component Value Date   HGBA1C 5.2 02/15/2017   Urine Drug Screen:     Component Value Date/Time   LABOPIA NONE DETECTED 02/14/2017 2124   COCAINSCRNUR NONE DETECTED 02/14/2017 2124   LABBENZ NONE DETECTED 02/14/2017 2124   AMPHETMU NONE DETECTED 02/14/2017 2124   THCU NONE DETECTED 02/14/2017 2124   LABBARB NONE DETECTED 02/14/2017 2124    Alcohol Level     Component Value  Date/Time   ETH 152 (H) 02/14/2017 2030    IMAGING I have personally reviewed the radiological images below and agree with the radiology interpretations.  Ct Angio Head/Neck W and Wo Contrast 02/14/2017 IMPRESSION: CT head: 1. Focal hypodensity in left insula and left posterior putamen compatible with infarction. 2. Hyperdensity and distal left M1. 3. ASPECTS is 8 CTA neck: 1. Occlusion of the left common and internal carotid artery from the arch to the distal cavernous segment. 2. Patent right common carotid system and vertebral arteries without dissection, aneurysm, or significant stenosis. 3. Moderate calcific atherosclerosis of the aortic arch 4. Centrilobular emphysema of lung apices. CTA head: 1. Patent paraclinoid and terminal segments of left internal carotid artery and minimal flow in proximal left M1 likely retrograde via a tiny left posterior communicating artery. 2. Occlusion of mid and distal left M1 with poor left MCA collateralization. 3. Left A1 occlusion. Bilateral A2 are patent with the left likely perfused via  the anterior communicating artery. Symmetric distal ACA circulation. 4. Patent right MCA and posterior circulation. 5. No intracranial aneurysm identified.   Ct Head Wo Contrast 02/15/2017 IMPRESSION: 1. Contrast staining versus blood products LEFT basal ganglia to LEFT temporal lobe. Recommend 6-12 hour follow-up CT. 2. Moderate to severe chronic small vessel ischemic disease. T  Mr Brain 83 Contrast Mr Maxine Glenn Head/brain Wo Cm 02/15/2017 IMPRESSION: 1. Acute confluent infarct involving the left putamen, caudate body, and corona radiata. 2 tiny cortical infarcts in the left MCA distribution. 2. Extensive chronic white matter disease, some combination of patient's multiple sclerosis and chronic microvascular ischemia. 3. Continued good patency of recently re- cannulized left ICA and M1 segment. 4. Mild left M1 segment irregularity. Moderate left M2 inferior division stenosis. 5.  Moderate to advanced stenosis of the left V4 segment.   Ct Cerebral Perfusion W Contrast 02/14/2017 IMPRESSION: 1. Left MCA distribution perfusion anomaly. Ischemic penumbra of 33 cc calculated by mismatch volume. 2. 0 cc infarct core (CBF <30%) by perfusion. This does not included the infarct core volume of the ASPECT 8 infarct on non contrast CT of the head involving the part of the left insula and putamen as visible infarct on noncontrast CT can be pseudo normalized on perfusion.   Portable Chest Xray 02/15/2017 IMPRESSION: 1. ACDF of the lower cervical spine. 2. Satisfactory support line and tube positions. 3. Atelectasis and/or scarring at the left lung base. 4. Aortic atherosclerosis.  Ct Head Code Stroke W/o Cm 02/14/2017 IMPRESSION: CT head: 1. Focal hypodensity in left insula and left posterior putamen compatible with infarction. 2. Hyperdensity and distal left M1. 3. ASPECTS is 8 CTA neck: 1. Occlusion of the left common and internal carotid artery from the arch to the distal cavernous segment. 2. Patent right common carotid system and vertebral arteries without dissection, aneurysm, or significant stenosis. 3. Moderate calcific atherosclerosis of the aortic arch 4. Centrilobular emphysema of lung apices. CTA head: 1. Patent paraclinoid and terminal segments of left internal carotid artery and minimal flow in proximal left M1 likely retrograde via a tiny left posterior communicating artery. 2. Occlusion of mid and distal left M1 with poor left MCA collateralization. 3. Left A1 occlusion. Bilateral A2 are patent with the left likely perfused via the anterior communicating artery. Symmetric distal ACA circulation. 4. Patent right MCA and posterior circulation. 5. No intracranial aneurysm identified.    TTE - Left ventricle: The cavity size was normal. There was moderate   concentric hypertrophy. Systolic function was vigorous. The   estimated ejection fraction was in the range of 65% to 70%.  Wall   motion was normal; there were no regional wall motion   abnormalities. Doppler parameters are consistent with abnormal   left ventricular relaxation (grade 1 diastolic dysfunction).   Doppler parameters are consistent with elevated ventricular   end-diastolic filling pressure. - Aortic valve: Trileaflet; mildly thickened, mildly calcified   leaflets. There was mild stenosis. Mean gradient (S): 11 mm Hg.   Peak gradient (S): 17 mm Hg. Valve area (VTI): 1.45 cm^2. Valve   area (Vmax): 1.73 cm^2. Valve area (Vmean): 1.44 cm^2. - Mitral valve: Calcified annulus. Mildly thickened leaflets .   There was trivial regurgitation. - Left atrium: The atrium was normal in size. - Right ventricle: The cavity size was normal. Wall thickness was   normal. Systolic function was normal. - Right atrium: The atrium was normal in size. - Tricuspid valve: There was trivial regurgitation. - Pulmonary arteries: Systolic pressure was  within the normal   range. - Inferior vena cava: The vessel was normal in size. - Pericardium, extracardiac: There was no pericardial effusion. Impressions: - No cardiac source of emboli was indentified.  LE venous doppler There is no evidence of deep or superficial vein thrombosis involving the right and left lower extremities. All visualized vessels appear patent and compressible.  Incidental findings are consistent with a Baker's Cyst measuring 1.2 cm high by 3.5 cm wide by 3.1 cm long on the right.  Dg Chest Port 1 View 02/18/2017 IMPRESSION: Right much worse than left airspace disease has an appearance most worrisome for pneumonia. Associated small to moderate right pleural effusion is noted.    PHYSICAL EXAM  Temp:  [97.9 F (36.6 C)-99.1 F (37.3 C)] 97.9 F (36.6 C) (07/21 1131) Pulse Rate:  [58-86] 65 (07/21 1200) Resp:  [18-37] 23 (07/21 1200) BP: (133-184)/(51-95) 145/61 (07/21 1200) SpO2:  [93 %-99 %] 97 % (07/21 1200)  General - Well nourished, well  developed, elderly Caucasian lady Ophthalmologic - Fundi not visualized due to noncooperation.  Cardiovascular - Regular rate and rhythm.  Neuro - drowsy but arouses easily Eyes open, follows simple commands centrally and peripherally.mild dysarthria and expressive aphasia Left gaze preference, but able to cross midline. Blinking to visual threat bilaterally. PERRL. Facial symmetry not able to assess due to ET tube. RUE 0/5, RLE 3/5. LUE at least 4/5, LLE 4/5. DTR 1+ and right positive babinski. Sensation, coordination and gait not tested.   ASSESSMENT/PLAN [name] is a 71 y.o. female with a  20 year history of MS, hyperlipidemia, and CAD with stent in 2000, who presented as a Code Stroke for right sided weakness and garbled speech. She did not receive IV t-PA due to arriving outside of the treatment window.   Stroke:  Acute MCA infarct due to left CCA/ICA and MCA occlusion, s/p mechanical thrombectomy and left ICA stenting in the setting of newly diagnosed afib  Resultant  Intubated, right hemiplegia  CT head: Contrast staining versus blood products LEFT basal ganglia to LEFT temporal lobe.   CTA head and neck: left CCA origin occlusion, A1 and distal M1 occlusion  MRI head: Acute confluent infarct involving the left putamen, caudate body, and corona radiata, as well as tiny cortical infarcts in the left MCA distribution.    MRA head: Mild left M1 segment irregularity, moderate left M2 inferior division stenosis.   2D Echo  EF 65-70%  LE venous doppler no PFO but baker's cyst  LDL 74  HgbA1c 5.2  lovenox for VTE prophylaxis DIET DYS 2 Room service appropriate? Yes; Fluid consistency: Thin  aspirin 81 mg daily prior to admission, now on aspirin 81 and brilinta 90 mg bid due to high P2Y12 level. Cardiology recommend eliquis with plavix once pt stable for anticoagulation. I discussed with Dr. Corliss Skains and he is in agreement.   Patient counseled to be compliant with her  antithrombotic medications  Ongoing aggressive stroke risk factor management  Therapy recommendations:  pending  Disposition:  Pending  PAF with RVR  new diagnosis likely the cause for stroke  Off amiodarone drip and still on esmolol drip  Cardiology on board  Put on metoprolol for rate control   Eliquis for anticoagulation and Plavix for stent  Left CCA/ICA occlusion s/p thrombectomy  Likely due to undiagnosed afib PTA  S/p left ICA stent  Respiratory distress  Likely due to aspiration pneumonia  CXR concerning for pneumonia  CCM on board  Put  on Levaquin  On high flow oxygen mask  Continue ICU monitoring  Hypertension  Stable  Permissive hypertension (OK if <180/105) and gradually normalized within 5-7 days.  Long-term blood pressure goal normotensive  Hypotension on admission and postprocedure resolved  Hyperlipidemia  Home meds: none  LDL 74, goal < 70  Add atorvastatin 40mg  PO daily  Continue statin at discharge  Dysphagia   dys 2 diet with thin liquid  Speech is following  Other Stroke Risk Factors  Advanced age  ETOH use, advised to drink no more than 1 drink(s) a day  Coronary artery disease  Other Active Problems  Sinus bradycardia - resolved  Multiple sclerosis: follows neurology in [place], was on Betaseron, no flare up, took off 11/2015 due to elevated LFT  Right Baker's cyst  Hospital day # 5 Long d/w husband at bedside. Plan to start eliquis and Plavix and stop brillinta and aspirin. Explained increase bleeding risk but she needs Plavix for stent and eliquis for her afib for stroke prevention.. Use haldol prn for agitationContinue to wean off oxygen and transfer to floor if stable tomorrow  This patient is critically ill due to CCA occlusion, MCA occlusion, s/p mechanical thrombectomy and ICA stent, afib RVR, dysphagia, respiratory distress and at significant risk of neurological worsening, death form recurrent  stroke, hemorrhagic conversion, seizure, cerebral edema, heart failure, aspiration pneumonia, and respiratory failure. This patient's care requires constant monitoring of vital signs, hemodynamics, respiratory and cardiac monitoring, review of multiple databases, neurological assessment, discussion with family, other specialists and medical decision making of high complexity. I spent 35 minutes of neurocritical care time in the care of this patient. I had long discussion with husband at bedside, updated pt current condition, treatment plan and potential prognosis. He expressed understanding and appreciation.   Delia Heady,, MD   Stroke Neurology 02/19/2017 1:34 PM   To contact Stroke Continuity provider, please refer to [email or web address]. After hours, contact General Neurology

## 2017-02-19 NOTE — Progress Notes (Addendum)
[facility]  Brief:  Admitted 7/17 with Acute ischemic stroke, post admission course has been notable for hypertension, afib and hypoxemia likely due to aspiration pneumonia.  S: Feels OK Eating well No breathing complaints  Past Medical History:  Diagnosis Date  . High cholesterol   . Multiple sclerosis (HCC)    Gen: Denies fever, chills, weight change, fatigue, night sweats HEENT: Denies blurred vision, double vision, hearing loss, tinnitus, sinus congestion, rhinorrhea, sore throat, neck stiffness, dysphagia PULM: Denies shortness of breath, cough, sputum production, hemoptysis, wheezing CV: Denies chest pain, edema, orthopnea, paroxysmal nocturnal dyspnea, palpitations    O:  Vitals:   02/19/17 1000 02/19/17 1100 02/19/17 1131 02/19/17 1200  BP: (!) 140/51 (!) 144/61 (!) 144/61 (!) 145/61  Pulse: (!) 58 63 62 65  Resp: 19 (!) 26 (!) 21 (!) 23  Temp:   97.9 F (36.6 C)   TempSrc:   Oral   SpO2: 98% 97% 98% 97%  Weight:      Height:       4L O2 [place]  General:  Resting comfortably in bed HENT: NCAT OP clear PULM: CTA B, normal effort CV: RRR, no mgr GI: BS+, soft, nontender MSK: normal bulk and tone Neuro: awake, alert, no distress, speech coherent  This week's CXR images independently reviewed showing an infiltrate in the Right lung  CBC    Component Value Date/Time   WBC 12.0 (H) 02/19/2017 0228   RBC 3.30 (L) 02/19/2017 0228   HGB 10.3 (L) 02/19/2017 0228   HCT 31.0 (L) 02/19/2017 0228   PLT 191 02/19/2017 0228   MCV 93.9 02/19/2017 0228   MCH 31.2 02/19/2017 0228   MCHC 33.2 02/19/2017 0228   RDW 13.1 02/19/2017 0228   LYMPHSABS 1.3 02/15/2017 0558   MONOABS 0.4 02/15/2017 0558   EOSABS 0.0 02/15/2017 0558   BASOSABS 0.0 02/15/2017 0558   BMET    Component Value Date/Time   NA 134 (L) 02/19/2017 0228   K 2.8 (L) 02/19/2017 0228   CL 104 02/19/2017 0228   CO2 21 (L) 02/19/2017 0228   GLUCOSE 111 (H) 02/19/2017 0228   BUN 14 02/19/2017 0228   CREATININE 0.48 02/19/2017 0228   CALCIUM 8.1 (L) 02/19/2017 0228   GFRNONAA >60 02/19/2017 0228   GFRAA >60 02/19/2017 0228   Impression/Plan:  Afib: rate controlled, starting eliquis per cardiology, continue amiodarone, metoprolol Aspiration pneumonia: continue aspiration precautions, continue levaquin for 5 day course total Stroke: per neurology Acute hypoxemia: wean off O2 as needed to maintain O2 saturation > 90%  PCCM will sign off  Heber Gerster, MD [facility] Pager: [phone] Cell: [phone] After 3pm or if no response, call [phone]

## 2017-02-19 NOTE — Progress Notes (Deleted)
STROKE TEAM PROGRESS NOTE   SUBJECTIVE (INTERVAL HISTORY) Her husband is at the bedside. The pt  Is breathing better and is neurologically stable. He developed bilateral  Malar rash yesterday which was thought to be related to eliquis since that was on the new medication and hence it was stopped and changed to Xarelto  OBJECTIVE Temp:  [97.4 F (36.3 C)-99.5 F (37.5 C)] 98.3 F (36.8 C) (07/22 0736) Pulse Rate:  [52-82] 59 (07/22 1300) Cardiac Rhythm: Normal sinus rhythm (07/22 1200) Resp:  [13-28] 23 (07/22 1300) BP: (109-191)/(52-108) 153/69 (07/22 1300) SpO2:  [95 %-100 %] 98 % (07/22 1300)  CBC:  Recent Labs Lab 02/14/17 2030  02/15/17 0558  02/19/17 0228 02/20/17 0443  WBC 7.6  --  10.6*  < > 12.0* 7.5  NEUTROABS 4.4  --  8.9*  --   --   --   HGB 12.2  < > 9.8*  < > 10.3* 9.8*  HCT 37.3  < > 29.9*  < > 31.0* 29.4*  MCV 96.6  --  97.1  < > 93.9 93.6  PLT 203  --  218  < > 191 195  < > = values in this interval not displayed.  Basic Metabolic Panel:   Recent Labs Lab 02/16/17 0830  02/18/17 0215 02/19/17 0228 02/20/17 0443  NA 141  < > 139 134* 135  K 4.3  < > 3.6 2.8* 2.8*  CL 115*  < > 109 104 104  CO2 21*  < > 21* 21* 22  GLUCOSE 114*  < > 114* 111* 97  BUN 25*  < > 16 14 12   CREATININE 0.65  < > 0.59 0.48 0.62  CALCIUM 8.2*  < > 8.2* 8.1* 7.9*  MG 2.1  --  2.1  --   --   < > = values in this interval not displayed.  Lipid Panel:     Component Value Date/Time   CHOL 173 02/15/2017 0420   TRIG 145 02/15/2017 0420   HDL 70 02/15/2017 0420   CHOLHDL 2.5 02/15/2017 0420   VLDL 29 02/15/2017 0420   LDLCALC 74 02/15/2017 0420   HgbA1c:  Lab Results  Component Value Date   HGBA1C 5.2 02/15/2017   Urine Drug Screen:     Component Value Date/Time   LABOPIA NONE DETECTED 02/14/2017 2124   COCAINSCRNUR NONE DETECTED 02/14/2017 2124   LABBENZ NONE DETECTED 02/14/2017 2124   AMPHETMU NONE DETECTED 02/14/2017 2124   THCU NONE DETECTED 02/14/2017 2124    LABBARB NONE DETECTED 02/14/2017 2124    Alcohol Level     Component Value Date/Time   ETH 152 (H) 02/14/2017 2030    IMAGING I have personally reviewed the radiological images below and agree with the radiology interpretations.  Ct Angio Head/Neck W and Wo Contrast 02/14/2017 IMPRESSION: CT head: 1. Focal hypodensity in left insula and left posterior putamen compatible with infarction. 2. Hyperdensity and distal left M1. 3. ASPECTS is 8 CTA neck: 1. Occlusion of the left common and internal carotid artery from the arch to the distal cavernous segment. 2. Patent right common carotid system and vertebral arteries without dissection, aneurysm, or significant stenosis. 3. Moderate calcific atherosclerosis of the aortic arch 4. Centrilobular emphysema of lung apices. CTA head: 1. Patent paraclinoid and terminal segments of left internal carotid artery and minimal flow in proximal left M1 likely retrograde via a tiny left posterior communicating artery. 2. Occlusion of mid and distal left M1 with poor left MCA  collateralization. 3. Left A1 occlusion. Bilateral A2 are patent with the left likely perfused via the anterior communicating artery. Symmetric distal ACA circulation. 4. Patent right MCA and posterior circulation. 5. No intracranial aneurysm identified.   Ct Head Wo Contrast 02/15/2017 IMPRESSION: 1. Contrast staining versus blood products LEFT basal ganglia to LEFT temporal lobe. Recommend 6-12 hour follow-up CT. 2. Moderate to severe chronic small vessel ischemic disease. T  Mr Brain 84 Contrast Mr Maxine Glenn Head/brain Wo Cm 02/15/2017 IMPRESSION: 1. Acute confluent infarct involving the left putamen, caudate body, and corona radiata. 2 tiny cortical infarcts in the left MCA distribution. 2. Extensive chronic white matter disease, some combination of patient's multiple sclerosis and chronic microvascular ischemia. 3. Continued good patency of recently re- cannulized left ICA and M1 segment. 4.  Mild left M1 segment irregularity. Moderate left M2 inferior division stenosis. 5. Moderate to advanced stenosis of the left V4 segment.   Ct Cerebral Perfusion W Contrast 02/14/2017 IMPRESSION: 1. Left MCA distribution perfusion anomaly. Ischemic penumbra of 33 cc calculated by mismatch volume. 2. 0 cc infarct core (CBF <30%) by perfusion. This does not included the infarct core volume of the ASPECT 8 infarct on non contrast CT of the head involving the part of the left insula and putamen as visible infarct on noncontrast CT can be pseudo normalized on perfusion.   Portable Chest Xray 02/15/2017 IMPRESSION: 1. ACDF of the lower cervical spine. 2. Satisfactory support line and tube positions. 3. Atelectasis and/or scarring at the left lung base. 4. Aortic atherosclerosis.  Ct Head Code Stroke W/o Cm 02/14/2017 IMPRESSION: CT head: 1. Focal hypodensity in left insula and left posterior putamen compatible with infarction. 2. Hyperdensity and distal left M1. 3. ASPECTS is 8 CTA neck: 1. Occlusion of the left common and internal carotid artery from the arch to the distal cavernous segment. 2. Patent right common carotid system and vertebral arteries without dissection, aneurysm, or significant stenosis. 3. Moderate calcific atherosclerosis of the aortic arch 4. Centrilobular emphysema of lung apices. CTA head: 1. Patent paraclinoid and terminal segments of left internal carotid artery and minimal flow in proximal left M1 likely retrograde via a tiny left posterior communicating artery. 2. Occlusion of mid and distal left M1 with poor left MCA collateralization. 3. Left A1 occlusion. Bilateral A2 are patent with the left likely perfused via the anterior communicating artery. Symmetric distal ACA circulation. 4. Patent right MCA and posterior circulation. 5. No intracranial aneurysm identified.    TTE - Left ventricle: The cavity size was normal. There was moderate   concentric hypertrophy. Systolic function  was vigorous. The   estimated ejection fraction was in the range of 65% to 70%. Wall   motion was normal; there were no regional wall motion   abnormalities. Doppler parameters are consistent with abnormal   left ventricular relaxation (grade 1 diastolic dysfunction).   Doppler parameters are consistent with elevated ventricular   end-diastolic filling pressure. - Aortic valve: Trileaflet; mildly thickened, mildly calcified   leaflets. There was mild stenosis. Mean gradient (S): 11 mm Hg.   Peak gradient (S): 17 mm Hg. Valve area (VTI): 1.45 cm^2. Valve   area (Vmax): 1.73 cm^2. Valve area (Vmean): 1.44 cm^2. - Mitral valve: Calcified annulus. Mildly thickened leaflets .   There was trivial regurgitation. - Left atrium: The atrium was normal in size. - Right ventricle: The cavity size was normal. Wall thickness was   normal. Systolic function was normal. - Right atrium: The atrium was normal  in size. - Tricuspid valve: There was trivial regurgitation. - Pulmonary arteries: Systolic pressure was within the normal   range. - Inferior vena cava: The vessel was normal in size. - Pericardium, extracardiac: There was no pericardial effusion. Impressions: - No cardiac source of emboli was indentified.  LE venous doppler There is no evidence of deep or superficial vein thrombosis involving the right and left lower extremities. All visualized vessels appear patent and compressible.  Incidental findings are consistent with a Baker's Cyst measuring 1.2 cm high by 3.5 cm wide by 3.1 cm long on the right.  Dg Chest Port 1 View 02/18/2017 IMPRESSION: Right much worse than left airspace disease has an appearance most worrisome for pneumonia. Associated small to moderate right pleural effusion is noted.    PHYSICAL EXAM  Temp:  [97.4 F (36.3 C)-99.5 F (37.5 C)] 98.3 F (36.8 C) (07/22 0736) Pulse Rate:  [52-82] 59 (07/22 1300) Resp:  [13-28] 23 (07/22 1300) BP: (109-191)/(52-108) 153/69  (07/22 1300) SpO2:  [95 %-100 %] 98 % (07/22 1300)  General - Well nourished, well developed,  elderly Caucasian lady. Ophthalmologic - Fundi not visualized due to noncooperation. Skin bilateral mild malar rash on the cheeks Cardiovascular - Regular rate and rhythm.  Neuro -  Awake alert. Eyes open, mild expressive aphasia with slow hesitant speech. Mildly impaired comprehension follows simple commands centrally and peripherally. Left gaze preference, but able to cross midline. Blinking to visual threat bilaterally. PERRL. Facial symmetry not able to assess due to ET tube. RUE 0/5, RLE 3/5. LUE at least 4/5, LLE 4/5. DTR 1+ and right positive babinski. Sensation, coordination and gait not tested.   ASSESSMENT/PLAN Ms. Lorilyn Laitinen is a 71 y.o. female with a  20 year history of MS, hyperlipidemia, and CAD with stent in 2000, who presented as a Code Stroke for right sided weakness and garbled speech. She did not receive IV t-PA due to arriving outside of the treatment window.   Stroke:  Acute MCA infarct due to left CCA/ICA and MCA occlusion, s/p mechanical thrombectomy and left ICA stenting in the setting of newly diagnosed afib  Resultant  mild aphasia, right hemiplegia  CT head: Contrast staining versus blood products LEFT basal ganglia to LEFT temporal lobe.   CTA head and neck: left CCA origin occlusion, A1 and distal M1 occlusion  MRI head: Acute confluent infarct involving the left putamen, caudate body, and corona radiata, as well as tiny cortical infarcts in the left MCA distribution.    MRA head: Mild left M1 segment irregularity, moderate left M2 inferior division stenosis.   2D Echo  EF 65-70%  LE venous doppler no PFO but baker's cyst  LDL 74  HgbA1c 5.2  lovenox for VTE prophylaxis DIET DYS 2 Room service appropriate? Yes; Fluid consistency: Thin  aspirin 81 mg daily prior to admission, now on  Plavix and Xarelto Patient counseled to be compliant with her  antithrombotic medications  Ongoing aggressive stroke risk factor management  Therapy recommendations: Rehabilitation  Disposition:  CLR  PAF with RVR  new diagnosis likely the cause for stroke  Off amiodarone drip and still on esmolol drip  Cardiology on board  Put on metoprolol for rate control  Continue DAPT for now and will need to start anticoagulation in 5 days (over the weekend). Due to high risk of bleeding with eliquis and brilinta, cardiology recommended eliquis with plavix. Dr. Corliss Skains is in agreement.  Left CCA/ICA occlusion s/p thrombectomy  Likely due to undiagnosed afib  PTA  S/p left ICA stent  Respiratory distress  Likely due to aspiration pneumonia  CXR concerning for pneumonia  CCM on board  Put on Levaquin  On high flow oxygen mask  Continue ICU monitoring  Hypertension  Stable  Permissive hypertension (OK if <180/105) and gradually normalized within 5-7 days.  Long-term blood pressure goal normotensive  Hypotension on admission and postprocedure resolved  Hyperlipidemia  Home meds: none  LDL 74, goal < 70  Add atorvastatin 40mg  PO daily  Continue statin at discharge  Dysphagia   dys 2 diet with thin liquid  Speech is following  Other Stroke Risk Factors  Advanced age  ETOH use, advised to drink no more than 1 drink(s) a day  Coronary artery disease  Other Active Problems  Sinus bradycardia - resolved  Multiple sclerosis: follows neurology in [place], was on Betaseron, no flare up, took off 11/2015 due to elevated LFT  Right Baker's cyst  Hospital day # 6 Plan agree with changing eliquis to Xarelto though I doubt the rash was related to eliquis. Mobilize out of bed. Therapy consults. Transfer to floor bed when available. No family available at bedside for discussion today This patient is critically ill due to CCA occlusion, MCA occlusion, s/p mechanical thrombectomy and ICA stent, afib RVR, dysphagia,  respiratory distress and at significant risk of neurological worsening, death form recurrent stroke, hemorrhagic conversion, seizure, cerebral edema, heart failure, aspiration pneumonia, and respiratory failure. This patient's care requires constant monitoring of vital signs, hemodynamics, respiratory and cardiac monitoring, review of multiple databases, neurological assessment, discussion with family, other specialists and medical decision making of high complexity. I spent 35 minutes of neurocritical care time in the care of this patient. I had long discussion with husband at bedside, updated pt current condition, treatment plan and potential prognosis. He expressed understanding and appreciation.  Delia Heady, MD  [facility] [address] [place], [place] [address]  Phone [phone] Fax [phone]   To contact Stroke Continuity provider, please refer to [email or web address]. After hours, contact General Neurology

## 2017-02-20 DIAGNOSIS — I6300 Cerebral infarction due to thrombosis of unspecified precerebral artery: Secondary | ICD-10-CM

## 2017-02-20 DIAGNOSIS — I25110 Atherosclerotic heart disease of native coronary artery with unstable angina pectoris: Secondary | ICD-10-CM

## 2017-02-20 LAB — CBC
HCT: 29.4 % — ABNORMAL LOW (ref 36.0–46.0)
Hemoglobin: 9.8 g/dL — ABNORMAL LOW (ref 12.0–15.0)
MCH: 31.2 pg (ref 26.0–34.0)
MCHC: 33.3 g/dL (ref 30.0–36.0)
MCV: 93.6 fL (ref 78.0–100.0)
Platelets: 195 10*3/uL (ref 150–400)
RBC: 3.14 MIL/uL — ABNORMAL LOW (ref 3.87–5.11)
RDW: 13.4 % (ref 11.5–15.5)
WBC: 7.5 10*3/uL (ref 4.0–10.5)

## 2017-02-20 LAB — BASIC METABOLIC PANEL
ANION GAP: 9 (ref 5–15)
BUN: 12 mg/dL (ref 6–20)
CO2: 22 mmol/L (ref 22–32)
Calcium: 7.9 mg/dL — ABNORMAL LOW (ref 8.9–10.3)
Chloride: 104 mmol/L (ref 101–111)
Creatinine, Ser: 0.62 mg/dL (ref 0.44–1.00)
GFR calc Af Amer: >60 mL/min (ref >60)
GLUCOSE: 97 mg/dL (ref 65–99)
Potassium: 2.8 mmol/L — ABNORMAL LOW (ref 3.5–5.1)
Sodium: 135 mmol/L (ref 135–145)

## 2017-02-20 MED ORDER — RIVAROXABAN 20 MG PO TABS
20.0000 mg | ORAL_TABLET | Freq: Every day | ORAL | Status: DC
  Administered 2017-02-20 – 2017-02-21 (×2): 20 mg via ORAL
  Filled 2017-02-20 (×3): qty 1

## 2017-02-20 MED ORDER — RIVAROXABAN (XARELTO) EDUCATION KIT FOR AFIB PATIENTS
PACK | Freq: Once | Status: AC
  Administered 2017-02-20: 14:00:00
  Filled 2017-02-20: qty 1

## 2017-02-20 MED ORDER — POTASSIUM CHLORIDE 20 MEQ PO PACK
20.0000 meq | PACK | Freq: Three times a day (TID) | ORAL | Status: AC
  Administered 2017-02-20 – 2017-02-22 (×6): 20 meq via ORAL
  Filled 2017-02-20 (×7): qty 1

## 2017-02-20 NOTE — Progress Notes (Signed)
STROKE TEAM PROGRESS NOTE   SUBJECTIVE (INTERVAL HISTORY) Her husband is not at the bedside.  She Is breathing better and is neurologically stable. He developed bilateral  Malar rash yesterday which was thought to be related to eliquis since that was on the new medication and hence it was stopped and changed to Xarelto   OBJECTIVE Temp:  [97.4 F (36.3 C)-99.5 F (37.5 C)] 98.3 F (36.8 C) (07/22 0736) Pulse Rate:  [52-82] 59 (07/22 1300) Cardiac Rhythm: Normal sinus rhythm (07/22 1200) Resp:  [13-28] 23 (07/22 1300) BP: (109-191)/(52-108) 153/69 (07/22 1300) SpO2:  [95 %-100 %] 98 % (07/22 1300)  CBC:  Recent Labs Lab 02/14/17 2030  02/15/17 0558  02/19/17 0228 02/20/17 0443  WBC 7.6  --  10.6*  < > 12.0* 7.5  NEUTROABS 4.4  --  8.9*  --   --   --   HGB 12.2  < > 9.8*  < > 10.3* 9.8*  HCT 37.3  < > 29.9*  < > 31.0* 29.4*  MCV 96.6  --  97.1  < > 93.9 93.6  PLT 203  --  218  < > 191 195  < > = values in this interval not displayed.  Basic Metabolic Panel:   Recent Labs Lab 02/16/17 0830  02/18/17 0215 02/19/17 0228 02/20/17 0443  NA 141  < > 139 134* 135  K 4.3  < > 3.6 2.8* 2.8*  CL 115*  < > 109 104 104  CO2 21*  < > 21* 21* 22  GLUCOSE 114*  < > 114* 111* 97  BUN 25*  < > 16 14 12   CREATININE 0.65  < > 0.59 0.48 0.62  CALCIUM 8.2*  < > 8.2* 8.1* 7.9*  MG 2.1  --  2.1  --   --   < > = values in this interval not displayed.  Lipid Panel:     Component Value Date/Time   CHOL 173 02/15/2017 0420   TRIG 145 02/15/2017 0420   HDL 70 02/15/2017 0420   CHOLHDL 2.5 02/15/2017 0420   VLDL 29 02/15/2017 0420   LDLCALC 74 02/15/2017 0420   HgbA1c:  Lab Results  Component Value Date   HGBA1C 5.2 02/15/2017   Urine Drug Screen:     Component Value Date/Time   LABOPIA NONE DETECTED 02/14/2017 2124   COCAINSCRNUR NONE DETECTED 02/14/2017 2124   LABBENZ NONE DETECTED 02/14/2017 2124   AMPHETMU NONE DETECTED 02/14/2017 2124   THCU NONE DETECTED 02/14/2017  2124   LABBARB NONE DETECTED 02/14/2017 2124    Alcohol Level     Component Value Date/Time   ETH 152 (H) 02/14/2017 2030    IMAGING I have personally reviewed the radiological images below and agree with the radiology interpretations.  Ct Angio Head/Neck W and Wo Contrast 02/14/2017 IMPRESSION: CT head: 1. Focal hypodensity in left insula and left posterior putamen compatible with infarction. 2. Hyperdensity and distal left M1. 3. ASPECTS is 8 CTA neck: 1. Occlusion of the left common and internal carotid artery from the arch to the distal cavernous segment. 2. Patent right common carotid system and vertebral arteries without dissection, aneurysm, or significant stenosis. 3. Moderate calcific atherosclerosis of the aortic arch 4. Centrilobular emphysema of lung apices. CTA head: 1. Patent paraclinoid and terminal segments of left internal carotid artery and minimal flow in proximal left M1 likely retrograde via a tiny left posterior communicating artery. 2. Occlusion of mid and distal left M1 with poor left  MCA collateralization. 3. Left A1 occlusion. Bilateral A2 are patent with the left likely perfused via the anterior communicating artery. Symmetric distal ACA circulation. 4. Patent right MCA and posterior circulation. 5. No intracranial aneurysm identified.   Ct Head Wo Contrast 02/15/2017 IMPRESSION: 1. Contrast staining versus blood products LEFT basal ganglia to LEFT temporal lobe. Recommend 6-12 hour follow-up CT. 2. Moderate to severe chronic small vessel ischemic disease. T  Mr Brain 86 Contrast Mr Maxine Glenn Head/brain Wo Cm 02/15/2017 IMPRESSION: 1. Acute confluent infarct involving the left putamen, caudate body, and corona radiata. 2 tiny cortical infarcts in the left MCA distribution. 2. Extensive chronic white matter disease, some combination of patient's multiple sclerosis and chronic microvascular ischemia. 3. Continued good patency of recently re- cannulized left ICA and M1 segment.  4. Mild left M1 segment irregularity. Moderate left M2 inferior division stenosis. 5. Moderate to advanced stenosis of the left V4 segment.   Ct Cerebral Perfusion W Contrast 02/14/2017 IMPRESSION: 1. Left MCA distribution perfusion anomaly. Ischemic penumbra of 33 cc calculated by mismatch volume. 2. 0 cc infarct core (CBF <30%) by perfusion. This does not included the infarct core volume of the ASPECT 8 infarct on non contrast CT of the head involving the part of the left insula and putamen as visible infarct on noncontrast CT can be pseudo normalized on perfusion.   Portable Chest Xray 02/15/2017 IMPRESSION: 1. ACDF of the lower cervical spine. 2. Satisfactory support line and tube positions. 3. Atelectasis and/or scarring at the left lung base. 4. Aortic atherosclerosis.  Ct Head Code Stroke W/o Cm 02/14/2017 IMPRESSION: CT head: 1. Focal hypodensity in left insula and left posterior putamen compatible with infarction. 2. Hyperdensity and distal left M1. 3. ASPECTS is 8 CTA neck: 1. Occlusion of the left common and internal carotid artery from the arch to the distal cavernous segment. 2. Patent right common carotid system and vertebral arteries without dissection, aneurysm, or significant stenosis. 3. Moderate calcific atherosclerosis of the aortic arch 4. Centrilobular emphysema of lung apices. CTA head: 1. Patent paraclinoid and terminal segments of left internal carotid artery and minimal flow in proximal left M1 likely retrograde via a tiny left posterior communicating artery. 2. Occlusion of mid and distal left M1 with poor left MCA collateralization. 3. Left A1 occlusion. Bilateral A2 are patent with the left likely perfused via the anterior communicating artery. Symmetric distal ACA circulation. 4. Patent right MCA and posterior circulation. 5. No intracranial aneurysm identified.    TTE - Left ventricle: The cavity size was normal. There was moderate   concentric hypertrophy. Systolic  function was vigorous. The   estimated ejection fraction was in the range of 65% to 70%. Wall   motion was normal; there were no regional wall motion   abnormalities. Doppler parameters are consistent with abnormal   left ventricular relaxation (grade 1 diastolic dysfunction).   Doppler parameters are consistent with elevated ventricular   end-diastolic filling pressure. - Aortic valve: Trileaflet; mildly thickened, mildly calcified   leaflets. There was mild stenosis. Mean gradient (S): 11 mm Hg.   Peak gradient (S): 17 mm Hg. Valve area (VTI): 1.45 cm^2. Valve   area (Vmax): 1.73 cm^2. Valve area (Vmean): 1.44 cm^2. - Mitral valve: Calcified annulus. Mildly thickened leaflets .   There was trivial regurgitation. - Left atrium: The atrium was normal in size. - Right ventricle: The cavity size was normal. Wall thickness was   normal. Systolic function was normal. - Right atrium: The atrium was  normal in size. - Tricuspid valve: There was trivial regurgitation. - Pulmonary arteries: Systolic pressure was within the normal   range. - Inferior vena cava: The vessel was normal in size. - Pericardium, extracardiac: There was no pericardial effusion. Impressions: - No cardiac source of emboli was indentified.  LE venous doppler There is no evidence of deep or superficial vein thrombosis involving the right and left lower extremities. All visualized vessels appear patent and compressible.  Incidental findings are consistent with a Baker's Cyst measuring 1.2 cm high by 3.5 cm wide by 3.1 cm long on the right.  Dg Chest Port 1 View 02/18/2017 IMPRESSION: Right much worse than left airspace disease has an appearance most worrisome for pneumonia. Associated small to moderate right pleural effusion is noted.    PHYSICAL EXAM  Temp:  [97.4 F (36.3 C)-99.5 F (37.5 C)] 98.3 F (36.8 C) (07/22 0736) Pulse Rate:  [52-82] 59 (07/22 1300) Resp:  [13-28] 23 (07/22 1300) BP: (109-191)/(52-108)  153/69 (07/22 1300) SpO2:  [95 %-100 %] 98 % (07/22 1300)  General - Well nourished, well developed, elderly Caucasian lady Ophthalmologic - Fundi not visualized due to noncooperation.  Cardiovascular - Regular rate and rhythm.  Neuro - drowsy but arouses easily Eyes open, follows simple commands centrally and peripherally.mild dysarthria and expressive aphasia Left gaze preference, but able to cross midline. Blinking to visual threat bilaterally. PERRL. Facial symmetry not able to assess due to ET tube. RUE 0/5, RLE 3/5. LUE at least 4/5, LLE 4/5. DTR 1+ and right positive babinski. Sensation, coordination and gait not tested.   ASSESSMENT/PLAN Ms. Katheleen Stella is a 71 y.o. female with a  20 year history of MS, hyperlipidemia, and CAD with stent in 2000, who presented as a Code Stroke for right sided weakness and garbled speech. She did not receive IV t-PA due to arriving outside of the treatment window.   Stroke:  Acute MCA infarct due to left CCA/ICA and MCA occlusion, s/p mechanical thrombectomy and left ICA stenting in the setting of newly diagnosed afib  Resultant  Intubated, right hemiplegia  CT head: Contrast staining versus blood products LEFT basal ganglia to LEFT temporal lobe.   CTA head and neck: left CCA origin occlusion, A1 and distal M1 occlusion  MRI head: Acute confluent infarct involving the left putamen, caudate body, and corona radiata, as well as tiny cortical infarcts in the left MCA distribution.    MRA head: Mild left M1 segment irregularity, moderate left M2 inferior division stenosis.   2D Echo  EF 65-70%  LE venous doppler no PFO but baker's cyst  LDL 74  HgbA1c 5.2  lovenox for VTE prophylaxis DIET DYS 2 Room service appropriate? Yes; Fluid consistency: Thin  aspirin 81 mg daily prior to admission, now on aspirin 81 and brilinta 90 mg bid due to high P2Y12 level. Cardiology recommend eliquis with plavix once pt stable for anticoagulation. I discussed  with Dr. Corliss Skains and he is in agreement.   Patient counseled to be compliant with her antithrombotic medications  Ongoing aggressive stroke risk factor management  Therapy recommendations:  pending  Disposition:  Pending  PAF with RVR  new diagnosis likely the cause for stroke  Off amiodarone drip and still on esmolol drip  Cardiology on board  Put on metoprolol for rate control   Eliquis for anticoagulation and Plavix for stent  Left CCA/ICA occlusion s/p thrombectomy  Likely due to undiagnosed afib PTA  S/p left ICA stent  Respiratory distress  Likely due to aspiration pneumonia  CXR concerning for pneumonia  CCM on board  Put on Levaquin  On high flow oxygen mask  Continue ICU monitoring  Hypertension  Stable  Permissive hypertension (OK if <180/105) and gradually normalized within 5-7 days.  Long-term blood pressure goal normotensive  Hypotension on admission and postprocedure resolved  Hyperlipidemia  Home meds: none  LDL 74, goal < 70  Add atorvastatin 40mg  PO daily  Continue statin at discharge  Dysphagia   dys 2 diet with thin liquid  Speech is following  Other Stroke Risk Factors  Advanced age  ETOH use, advised to drink no more than 1 drink(s) a day  Coronary artery disease  Other Active Problems  Sinus bradycardia - resolved  Multiple sclerosis: follows neurology in [place], was on Betaseron, no flare up, took off 11/2015 due to elevated LFT  Right Baker's cyst  Hospital day # 6  Plan  agree with changing eliquis to Xarelto though I doubt the rash was related to eliquis. Mobilize out of bed. Therapy consults. Transfer to floor bed when available. No family available at bedside for discussion today Replace potassium This patient is critically ill due to CCA occlusion, MCA occlusion, s/p mechanical thrombectomy and ICA stent, afib RVR, dysphagia, respiratory distress and at significant risk of neurological worsening,  death form recurrent stroke, hemorrhagic conversion, seizure, cerebral edema, heart failure, aspiration pneumonia, and respiratory failure. This patient's care requires constant monitoring of vital signs, hemodynamics, respiratory and cardiac monitoring, review of multiple databases, neurological assessment, discussion with family, other specialists and medical decision making of high complexity. I spent 35 minutes of neurocritical care time in the care of this patient. I had long discussion with husband at bedside, updated pt current condition, treatment plan and potential prognosis. He expressed understanding and appreciation.   Delia Heady,, MD   Stroke Neurology 02/20/2017 1:18 PM   To contact Stroke Continuity provider, please refer to [email or web address]. After hours, contact General Neurology

## 2017-02-20 NOTE — Progress Notes (Signed)
ANTICOAGULATION CONSULT NOTE - Initial Consult  Pharmacy Consult for apixaban Indication: stroke  Allergies  Allergen Reactions  . Penicillins   . Eliquis [Apixaban] Rash    Patient Measurements: Height: 5\' 2"  (157.5 cm) Weight: 131 lb 6.3 oz (59.6 kg) IBW/kg (Calculated) : 50.1  Vital Signs: Temp: 98.3 F (36.8 C) (07/22 0736) Temp Source: Oral (07/22 0736) BP: 153/69 (07/22 1300) Pulse Rate: 59 (07/22 1300)  Labs:  Recent Labs  02/18/17 0215 02/19/17 0228 02/20/17 0443  HGB 10.5* 10.3* 9.8*  HCT 31.5* 31.0* 29.4*  PLT 210 191 195  CREATININE 0.59 0.48 0.62    Estimated Creatinine Clearance: 49.5 mL/min (by C-G formula based on SCr of 0.62 mg/dL).  Assessment: Acute MCA infarct due to left CCA/ICA and MCA occlusion, s/p mechanical thrombectomy and left ICA stenting in the setting of newly diagnosed afib   On sq enox for ppx  Stroke ok full dose ac  apixaban started last night and pt developed facial rash Stopped and converted to enox 1mg /kg overnight  Plan:  Dc enox Xarelto 20 mg daily Watch for allergic reactions  Isaac Bliss, PharmD, BCPS, BCCCP Clinical Pharmacist Clinical phone for 02/20/2017 from 7a-3:30p: [phone] If after 3:30p, please call main pharmacy at: [phone] 02/20/2017 1:12 PM

## 2017-02-20 NOTE — Progress Notes (Signed)
Physical Therapy Treatment Patient Details Name: Kaitlin [name] MRN: [id number] DOB: Feb 06, 1946 Today's Date: 02/20/2017    History of Present Illness Pt is a 71 y/o female with a PMH significant for multiple sclerosis. She presented to [facility] 7/17 with R-sided weakness and aphasia. CT revealed L MCA occlusion. She is now s/p R vertebral arteriogram with complete revascularization of occluded L MCA and stent assisted angioplasty of proximal L ICA occlusion. She was intubated 7/17-7/18.    PT Comments    Continuing work on functional mobility and activity tolerance; Noting more movement RLE today, and able to initiate pre-gait and taking steps; Continue to recommend comprehensive inpatient rehab (CIR) for post-acute therapy needs.    Follow Up Recommendations  CIR;Supervision/Assistance - 24 hour     Equipment Recommendations   (to be determined)    Recommendations for Other Services Rehab consult     Precautions / Restrictions Precautions Precautions: Fall Precaution Comments: R side inattention Restrictions Weight Bearing Restrictions: No    Mobility  Bed Mobility Overal bed mobility: Needs Assistance Bed Mobility: Supine to Sit     Supine to sit: Mod assist;Max assist     General bed mobility comments: Pt able to initiate B LE off of bed and requiring mod assist to pull trunk into upright position.   Transfers Overall transfer level: Needs assistance Equipment used: 2 person hand held assist Transfers: Sit to/from [employer] Sit to Stand: Mod assist;Min assist;+2 safety/equipment Stand pivot transfers: Mod assist;+2 physical assistance;+2 safety/equipment       General transfer comment: Pt able to rise to standing with R knee blocked with heavy min +2 assistance. Mod +2 assist for taking pivotal steps with VC's to maintain upright positioning and assistance to placement of R LE.  Ambulation/Gait Ambulation/Gait assistance: +2 physical  assistance;Mod assist Ambulation Distance (Feet): 4 Feet (and worked some on marching in place) Assistive device: 2 person hand held assist Gait Pattern/deviations: Decreased stance time - right;Decreased step length - right;Decreased step length - left     General Gait Details: Multimodal cues for upright posture and stability in R stance; assist to advance R foot in swing; noted improving ability to organize center of mass over feet   Stairs            Wheelchair Mobility    Modified Rankin (Stroke Patients Only) Modified Rankin (Stroke Patients Only) Pre-Morbid Rankin Score: No symptoms Modified Rankin: Severe disability     Balance Overall balance assessment: Needs assistance Sitting-balance support: Feet supported;No upper extremity supported Sitting balance-Leahy Scale: Fair Sitting balance - Comments: Able to maintain sitting balance with min guard to supervision for safety while brushing hair. Postural control: Posterior lean (in standing) Standing balance support: Bilateral upper extremity supported;During functional activity Standing balance-Leahy Scale: Poor Standing balance comment: Able to maintain standing with mod-max +2 fluctuating assistance.                             Cognition Arousal/Alertness: Awake/alert Behavior During Therapy: Flat affect Overall Cognitive Status: Impaired/Different from baseline Area of Impairment: Orientation;Attention;Memory;Following commands;Safety/judgement;Awareness;Problem solving                 Orientation Level: Disoriented to;Situation Current Attention Level: Sustained (approaching selective ) Memory: Decreased short-term memory;Decreased recall of precautions Following Commands: Follows one step commands with increased time Safety/Judgement: Decreased awareness of safety;Decreased awareness of deficits Awareness: Intellectual Problem Solving: Slow processing;Decreased initiation;Difficulty  sequencing;Requires verbal cues;Requires tactile cues  General Comments: Pt able to follow one-step commands during session and requiring increased time and multimodal cues as session progressed likely due to fatigue.       Exercises Other Exercises Other Exercises: Facilitated weight bearing through R UE for functional reaching tasks crossing midline with L UE.    General Comments General comments (skin integrity, edema, etc.): O2 removed for majority of session with O2 desaturation to 88% following short distance ambulation. Returned O2 and RN adjusted to 2L (pt had been on 4L prior to therapy session)      Pertinent Vitals/Pain Pain Assessment: Faces Faces Pain Scale: No hurt Pain Intervention(s): Monitored during session    Home Living Family/patient expects to be discharged to:: Inpatient rehab Living Arrangements: Spouse/significant other Available Help at Discharge: Family;Available 24 hours/day Type of Home: House Home Access: Stairs to enter Entrance Stairs-Rails: Right Home Layout: Multi-level Home Equipment: Shower seat - built in      Prior Function Level of Independence: Independent      Comments: Pt gardening the morning she was admitted   PT Goals (current goals can now be found in the care plan section) Acute Rehab PT Goals Patient Stated Goal: Family goal is to return to PLOF PT Goal Formulation: With family Time For Goal Achievement: 03/03/17 Potential to Achieve Goals: Good Progress towards PT goals: Progressing toward goals    Frequency    Min 3X/week      PT Plan Current plan remains appropriate    Co-evaluation PT/OT/SLP Co-Evaluation/Treatment: Yes Reason for Co-Treatment: For patient/therapist safety PT goals addressed during session: Mobility/safety with mobility OT goals addressed during session: ADL's and self-care      AM-PAC PT "6 Clicks" Daily Activity  Outcome Measure  Difficulty turning over in bed (including adjusting  bedclothes, sheets and blankets)?: Total Difficulty moving from lying on back to sitting on the side of the bed? : Total Difficulty sitting down on and standing up from a chair with arms (e.g., wheelchair, bedside commode, etc,.)?: Total Help needed moving to and from a bed to chair (including a wheelchair)?: A Lot Help needed walking in hospital room?: A Lot Help needed climbing 3-5 steps with a railing? : Total 6 Click Score: 8    End of Session Equipment Utilized During Treatment: Gait belt Activity Tolerance: Patient tolerated treatment well Patient left: in chair;with call bell/phone within reach;with chair alarm set Nurse Communication: Mobility status PT Visit Diagnosis: Unsteadiness on feet (R26.81);Hemiplegia and hemiparesis Hemiplegia - Right/Left: Right Hemiplegia - dominant/non-dominant: Non-dominant Hemiplegia - caused by: Cerebral infarction     Time: 1308-6578 PT Time Calculation (min) (ACUTE ONLY): 28 min  Charges:  $Gait Training: 8-22 mins                    G Codes:       Van Clines, PT  Acute Rehabilitation Services Pager [phone] Office [phone]    Levi Aland 02/20/2017, 1:40 PM

## 2017-02-20 NOTE — Evaluation (Addendum)
Occupational Therapy Evaluation Patient Details Name: Kaitlin [name] MRN: [id number] DOB: 22-Apr-1946 Today's Date: 02/20/2017    History of Present Illness Pt is [name] 71 y/o female with [name] PMH significant for multiple sclerosis. She presented to [facility] 7/17 with R-sided weakness and aphasia. CT revealed L MCA occlusion. She is now s/p R vertebral arteriogram with complete revascularization of occluded L MCA and stent assisted angioplasty of proximal L ICA occlusion. She was intubated 7/17-7/18.   Clinical Impression   PTA, pt was independent with ADL and functional mobility and specifically enjoys gardening. She currently requires mod assist for grooming and UB ADL, max assist +2 for LB ADL, and mod assist +2 for toilet transfers. She presents with decreased attention to the R side of her body and environment, R sided weakness, decreased attention and problem solving skills impacting her ability to participate in ADL at [facility]. She was able to sit at EOB to participate in hair brushing tasks demonstrating sustained attention and follow simple one-step commands with increased time during these tasks. Facilitated weight bearing through R UE during functional reaching tasks in preparation for improved functional use of R UE during self-care tasks. Feel pt would best benefit from CIR level therapies post-acute D/C in order to maximize return to independence prior to D/C home with her husband. OT will continue to follow while admitted.    Follow Up Recommendations  CIR;Supervision/Assistance - 24 hour    Equipment Recommendations  Other (comment) (TBD at next venue of care)    Recommendations for Other Services Rehab consult     Precautions / Restrictions Precautions Precautions: Fall Precaution Comments: R side inattention Restrictions Weight Bearing Restrictions: No      Mobility Bed Mobility Overal bed mobility: Needs Assistance Bed Mobility: Supine to Sit     Supine to sit: Mod assist;Max  assist     General bed mobility comments: Pt able to initiate B LE off of bed and requiring mod assist to pull trunk into upright position.   Transfers Overall transfer level: Needs assistance Equipment used: 2 person hand held assist Transfers: Sit to/from [employer] Sit to Stand: +2 safety/equipment;Min assist Stand pivot transfers: Mod assist;+2 physical assistance;+2 safety/equipment       General transfer comment: Pt able to rise to standing with R knee blocked with heavy min +2 assistance. Mod +2 assist for taking pivotal steps with VC's to maintain upright positioning and assistance to placement of R LE.    Balance Overall balance assessment: Needs assistance Sitting-balance support: Feet supported;No upper extremity supported Sitting balance-Leahy Scale: Fair Sitting balance - Comments: Able to maintain sitting balance with min guard to supervision for safety while brushing hair. Postural control: Posterior lean (in standing) Standing balance support: Bilateral upper extremity supported;During functional activity Standing balance-Leahy Scale: Poor Standing balance comment: Able to maintain standing with mod-max +2 fluctuating assistance.                            ADL either performed or assessed with clinical judgement   ADL Overall ADL's : Needs assistance/impaired Eating/Feeding: Moderate assistance;Sitting   Grooming: Minimal assistance;Sitting;Cueing for safety;Cueing for sequencing;Moderate assistance Grooming Details (indicate cue type and reason): Min assist for single handed tasks after set-up; mod assist for bimanual tasks Upper Body Bathing: Moderate assistance;Sitting   Lower Body Bathing: Maximal assistance;Sit to/from stand   Upper Body Dressing : Moderate assistance;Sitting   Lower Body Dressing: Maximal assistance;Sit to/from stand  Toilet Transfer: Moderate assistance;+2 for physical assistance;+2 for  safety/equipment;Stand-pivot   Toileting- Clothing Manipulation and Hygiene: Maximal assistance;Sit to/from stand       Functional mobility during ADLs: Moderate assistance;+2 for physical assistance General ADL Comments: Pt able to sit at EOB to brush her hair with VC's for sequence.      Vision   Vision Assessment?: Vision impaired- to be further tested in functional context Additional Comments: Pt able to cross midline with eyes but difficulty sustaining. Able to maintain midline gaze throughout tasks.      Perception     Praxis      Pertinent Vitals/Pain Pain Assessment: Faces Faces Pain Scale: No hurt Pain Intervention(s): Monitored during session     Hand Dominance Left   Extremity/Trunk Assessment Upper Extremity Assessment Upper Extremity Assessment: RUE deficits/detail RUE Deficits / Details: Noted inattention to R UE at times. Noted 2-/5 strength at elbow this session and slight movement in digits.    Lower Extremity Assessment Lower Extremity Assessment: Defer to PT evaluation   Cervical / Trunk Assessment Cervical / Trunk Assessment: Other exceptions Cervical / Trunk Exceptions: forward head/rounded shoulder posture   Communication Communication Communication: Expressive difficulties   Cognition Arousal/Alertness: Awake/alert Behavior During Therapy: Flat affect Overall Cognitive Status: Impaired/Different from baseline Area of Impairment: Orientation;Attention;Memory;Following commands;Safety/judgement;Awareness;Problem solving                 Orientation Level: Disoriented to;Situation Current Attention Level: Sustained (approaching selective ) Memory: Decreased short-term memory;Decreased recall of precautions Following Commands: Follows one step commands with increased time Safety/Judgement: Decreased awareness of safety;Decreased awareness of deficits Awareness: Intellectual Problem Solving: Slow processing;Decreased initiation;Difficulty  sequencing;Requires verbal cues;Requires tactile cues General Comments: Pt able to follow one-step commands during session and requiring increased time and multimodal cues as session progressed likely due to fatigue.    General Comments  O2 removed for majority of session with O2 desaturation to 88% following short distance ambulation. Returned O2 and RN adjusted to 2L (pt had been on 4L prior to therapy session)    Exercises Exercises: Other exercises Other Exercises Other Exercises: Facilitated weight bearing through R UE for functional reaching tasks crossing midline with L UE.   Shoulder Instructions      Home Living Family/patient expects to be discharged to:: Inpatient rehab Living Arrangements: Spouse/significant other Available Help at Discharge: Family;Available 24 hours/day Type of Home: House Home Access: Stairs to enter [employer] of Steps: 4 Entrance Stairs-Rails: Right Home Layout: Multi-level Alternate Level Stairs-Number of Steps: 1 (to bedroom; none to bathroom)   Bathroom Shower/Tub: Tub only;Walk-in shower   Bathroom Toilet: Standard Bathroom Accessibility: Yes   Home Equipment: Shower seat - built in          Prior Functioning/Environment Level of Independence: Independent        Comments: Pt gardening the morning she was admitted        OT Problem List: Decreased strength;Decreased range of motion;Decreased activity tolerance;Impaired balance (sitting and/or standing);Decreased safety awareness;Decreased knowledge of use of DME or AE;Decreased knowledge of precautions;Impaired UE functional use;Decreased cognition;Impaired vision/perception      OT Treatment/Interventions: Self-care/ADL training;Therapeutic exercise;Neuromuscular education;Energy conservation;DME and/or AE instruction;Splinting;Therapeutic activities;Patient/family education;Cognitive remediation/compensation;Visual/perceptual remediation/compensation;Balance training     OT Goals(Current goals can be found in the care plan section) Acute Rehab OT Goals Patient Stated Goal: Family goal is to return to PLOF OT Goal Formulation: With patient Time For Goal Achievement: 03/06/17 Potential to Achieve Goals: Good ADL Goals Pt Will Perform Grooming:  with min guard assist;sitting Pt Will Transfer to Toilet: with min guard assist;stand pivot transfer;bedside commode Pt Will Perform Toileting - Clothing Manipulation and hygiene: with min guard assist;sit to/from stand Pt/caregiver will Perform Home Exercise Program: Right Upper extremity;With minimal assist;With written HEP provided;Increased ROM;Increased strength Additional ADL Goal #1: Pt will identify 4/5 objects on L side of tray during self-feeding tasks.  Additional ADL Goal #2: Pt will demonstrate selective attention during seated grooming tasks in [name] minimally distracting environment.   OT Frequency: Min 3X/week   Barriers to D/C:            Co-evaluation PT/OT/SLP Co-Evaluation/Treatment: Yes Reason for Co-Treatment: Complexity of the patient's impairments (multi-system involvement);For patient/therapist safety   OT goals addressed during session: ADL's and self-care      AM-PAC PT "6 Clicks" Daily Activity     Outcome Measure Help from another person eating meals?: [name] Lot Help from another person taking care of personal grooming?: [name] Lot Help from another person toileting, which includes using toliet, bedpan, or urinal?: [name] Lot Help from another person bathing (including washing, rinsing, drying)?: [name] Lot Help from another person to put on and taking off regular upper body clothing?: [name] Lot Help from another person to put on and taking off regular lower body clothing?: [name] Lot 6 Click Score: 12   End of Session Equipment Utilized During Treatment: Gait belt;Oxygen Nurse Communication: Mobility status  Activity Tolerance: Patient tolerated treatment well Patient left: in chair;with call bell/phone  within reach;with nursing/sitter in room;with chair alarm set  OT Visit Diagnosis: Hemiplegia and hemiparesis;Other symptoms and signs involving cognitive function;Other abnormalities of gait and mobility (R26.89) Hemiplegia - Right/Left: Right Hemiplegia - dominant/non-dominant: Non-Dominant Hemiplegia - caused by: Cerebral infarction                Time: 1610-9604 OT Time Calculation (min): 28 min Charges:  OT General Charges $OT Visit: 1 Procedure OT Evaluation $OT Eval Moderate Complexity: 1 Procedure G-Codes:     Doristine Section, MS OTR/L  Pager: [phone]   Kaitlin [name] Ismelda Weatherman 02/20/2017, 10:31 AM

## 2017-02-20 NOTE — Progress Notes (Signed)
Progress Note  Patient Name: Kaitlin [name] Date of Encounter: 02/20/2017  Primary Cardiologist: Dr. Clovia Cuff  Subjective   Pt with CAD - s/p stenting on Monday . Presented with a CVA,  Found to have PAF  .   Inpatient Medications    Scheduled Meds: . amiodarone  200 mg Oral Daily  . atorvastatin  40 mg Oral q1800  . calcium-vitamin D  1 tablet Oral Daily  . chlorhexidine gluconate (MEDLINE KIT)  15 mL Mouth Rinse BID  . clopidogrel  75 mg Oral Daily  . enoxaparin (LOVENOX) injection  1 mg/kg Subcutaneous Q12H  . fentaNYL (SUBLIMAZE) injection  50 mcg Intravenous Once  . FLUoxetine  10 mg Oral Daily  . metoprolol tartrate  50 mg Oral BID  . multivitamin  1 tablet Oral BID  . pantoprazole  40 mg Oral Daily  . venlafaxine XR  75 mg Oral Daily   Continuous Infusions: . sodium chloride 50 mL/hr at 02/20/17 1000  . clevidipine    . esmolol Stopped (02/18/17 1634)  . fentaNYL infusion INTRAVENOUS Stopped (02/16/17 0800)  . levofloxacin (LEVAQUIN) IV Stopped (02/19/17 1642)  . phenylephrine (NEO-SYNEPHRINE) Adult infusion Stopped (02/16/17 0800)   PRN Meds: acetaminophen **OR** acetaminophen (TYLENOL) oral liquid 160 mg/5 mL **OR** acetaminophen, fentaNYL, gabapentin, hydrALAZINE, midazolam, QUEtiapine, senna-docusate   Vital Signs    Vitals:   02/20/17 0736 02/20/17 0800 02/20/17 0900 02/20/17 1000  BP:  (!) 147/59 (!) 159/65 (!) 150/58  Pulse:  60 66 71  Resp:  17 (!) 23 17  Temp: 98.3 F (36.8 C)     TempSrc: Oral     SpO2:  100% 100% 96%  Weight:      Height:        Intake/Output Summary (Last 24 hours) at 02/20/17 1052 Last data filed at 02/20/17 1000  Gross per 24 hour  Intake             2030 ml  Output             2500 ml  Net             -470 ml   Filed Weights   02/14/17 2000 02/14/17 2100 02/16/17 2230  Weight: 130 lb 4.7 oz (59.1 kg) 124 lb (56.2 kg) 131 lb 6.3 oz (59.6 kg)    Telemetry    NSR  - Personally Reviewed  ECG     NSR  -  Personally Reviewed  Physical Exam   GEN: No acute distress.  , s/p CVA ,  No facial rash this am  Neck: No JVD Cardiac: RRR, no murmurs, rubs, or gallops.  Respiratory: Clear to auscultation bilaterally. GI: Soft, nontender, non-distended  MS: No edema; No deformity. Neuro:  Nonfocal  Psych: Normal affect   Labs    Chemistry Recent Labs Lab 02/14/17 2030  02/18/17 0215 02/19/17 0228 02/20/17 0443  NA 138  < > 139 134* 135  K 4.0  < > 3.6 2.8* 2.8*  CL 107  < > 109 104 104  CO2 20*  < > 21* 21* 22  GLUCOSE 96  < > 114* 111* 97  BUN 23*  < > '16 14 12  '$ CREATININE 0.84  < > 0.59 0.48 0.62  CALCIUM 9.1  < > 8.2* 8.1* 7.9*  PROT 6.7  --   --   --   --   ALBUMIN 4.0  --   --   --   --   AST 25  --   --   --   --  ALT 20  --   --   --   --   ALKPHOS 58  --   --   --   --   BILITOT 0.4  --   --   --   --   GFRNONAA >60  < > >60 >60 >60  GFRAA >60  < > >60 >60 >60  ANIONGAP 11  < > _0 < > = values in this interval not displayed.   Hematology  Recent Labs Lab 02/18/17 0215 02/19/17 0228 02/20/17 0443  WBC 15.9* 12.0* 7.5  RBC 3.33* 3.30* 3.14*  HGB 10.5* 10.3* 9.8*  HCT 31.5* 31.0* 29.4*  MCV 94.6 93.9 93.6  MCH 31.5 31.2 31.2  MCHC 33.3 33.2 33.3  RDW 13.5 13.1 13.4  PLT 210 191 195    Cardiac Enzymes  Recent Labs Lab 02/15/17 0800 02/15/17 1458 02/15/17 2015  TROPONINI <0.03 0.03* 0.03*     Recent Labs Lab 02/14/17 2033  TROPIPOC 0.02     BNPNo results for input(s): BNP, PROBNP in the last 168 hours.   DDimer No results for input(s): DDIMER in the last 168 hours.   Radiology    Dg Chest Port 1 View  Result Date: 02/18/2017 CLINICAL DATA:  Crackles in both lungs this morning on physical examination. EXAM: PORTABLE CHEST 1 VIEW COMPARISON:  Single-view of the chest 02/16/2017. PA and lateral chest and CT chest 04/26/2012. FINDINGS: There is a small to moderate right pleural effusion. Airspace disease is seen in the right lower and right  upper lobes. There is also some airspace disease in the medial left lower lobe. No pneumothorax. IMPRESSION: Right much worse than left airspace disease has an appearance most worrisome for pneumonia. Associated small to moderate right pleural effusion is noted. Electronically Signed   By: Inge Rise M.D.   On: 02/18/2017 12:20    Cardiac Studies     Patient Profile     71 y.o. female with CVA , found to have PAF .   Assessment & Plan    1. Paroxysmal atrial fib:   Is in NSR currently . Was started on Eliquis but last night apparently developed an allergic reaction. Eliquis was stopped. Will start Xarelto 20 mg daily with meals.   2. CAD :   Continue plavix, metoprolol  No angina   Signed, Mertie Moores, MD  02/20/2017, 10:52 AM

## 2017-02-20 NOTE — Plan of Care (Signed)
Problem: Self-Care: Goal: Ability to participate in self-care as condition permits will improve Outcome: Progressing Patient working with PT this morning. Able to take steps and to reach and grab with left hand. Improved function in right leg, able to step in place. Sitting up in chair, both patient and husband understand the importance of participation in order to progress and improve. Both seem eager and willing when new information is provided.

## 2017-02-21 ENCOUNTER — Encounter (HOSPITAL_COMMUNITY): Payer: Self-pay | Admitting: Cardiology

## 2017-02-21 LAB — BASIC METABOLIC PANEL
ANION GAP: 6 (ref 5–15)
BUN: 11 mg/dL (ref 6–20)
CHLORIDE: 105 mmol/L (ref 101–111)
CO2: 25 mmol/L (ref 22–32)
Calcium: 8.3 mg/dL — ABNORMAL LOW (ref 8.9–10.3)
Creatinine, Ser: 0.66 mg/dL (ref 0.44–1.00)
GFR calc non Af Amer: >60 mL/min (ref >60)
Glucose, Bld: 108 mg/dL — ABNORMAL HIGH (ref 65–99)
POTASSIUM: 3.2 mmol/L — AB (ref 3.5–5.1)
SODIUM: 136 mmol/L (ref 135–145)

## 2017-02-21 LAB — CBC
HCT: 32.7 % — ABNORMAL LOW (ref 36.0–46.0)
HEMOGLOBIN: 10.7 g/dL — AB (ref 12.0–15.0)
MCH: 30.3 pg (ref 26.0–34.0)
MCHC: 32.7 g/dL (ref 30.0–36.0)
MCV: 92.6 fL (ref 78.0–100.0)
Platelets: 233 10*3/uL (ref 150–400)
RBC: 3.53 MIL/uL — AB (ref 3.87–5.11)
RDW: 13.1 % (ref 11.5–15.5)
WBC: 8.9 10*3/uL (ref 4.0–10.5)

## 2017-02-21 MED ORDER — ACETAMINOPHEN 325 MG PO TABS: 650.0000 mg | ORAL_TABLET | ORAL | Status: DC | PRN

## 2017-02-21 MED ORDER — WHITE PETROLATUM GEL
Status: AC
  Administered 2017-02-21: 18:00:00
  Filled 2017-02-21: qty 1

## 2017-02-21 MED ORDER — TRAZODONE HCL 50 MG PO TABS
50.0000 mg | ORAL_TABLET | Freq: Every day | ORAL | Status: DC
  Administered 2017-02-21: 50 mg via ORAL
  Filled 2017-02-21: qty 1

## 2017-02-21 NOTE — Progress Notes (Signed)
I met with pt , her husband, and sitter at the bedside. We discussed an inpt rehab admit pending insurance approval and bed availability. They are in agreement. To plan. I will begin insurance authorization and follow up tomorrow. [phone]

## 2017-02-21 NOTE — Progress Notes (Addendum)
Progress Note  Patient Name: Kaitlin [name] Date of Encounter: 02/21/2017  Primary Cardiologist: Dr Curly Rim [facility])  Subjective   Pt out of bed in chair, she walked with help this am. Waiting to see if hematuria clears before transfer  Inpatient Medications    Scheduled Meds: . amiodarone  200 mg Oral Daily  . atorvastatin  40 mg Oral q1800  . calcium-vitamin D  1 tablet Oral Daily  . chlorhexidine gluconate (MEDLINE KIT)  15 mL Mouth Rinse BID  . clopidogrel  75 mg Oral Daily  . fentaNYL (SUBLIMAZE) injection  50 mcg Intravenous Once  . FLUoxetine  10 mg Oral Daily  . metoprolol tartrate  50 mg Oral BID  . multivitamin  1 tablet Oral BID  . pantoprazole  40 mg Oral Daily  . potassium chloride  20 mEq Oral TID  . rivaroxaban  20 mg Oral Q supper  . traZODone  50 mg Oral QHS  . venlafaxine XR  75 mg Oral Daily   Continuous Infusions: . sodium chloride 50 mL/hr at 02/21/17 1100  . levofloxacin (LEVAQUIN) IV Stopped (02/20/17 1554)   PRN Meds: acetaminophen **OR** acetaminophen (TYLENOL) oral liquid 160 mg/5 mL **OR** acetaminophen, gabapentin, hydrALAZINE, midazolam, QUEtiapine, senna-docusate   Vital Signs    Vitals:   02/21/17 0900 02/21/17 1000 02/21/17 1100 02/21/17 1110  BP: (!) 123/42 (!) 130/43 (!) 145/64   Pulse: 79 74 70   Resp: (!) 29 (!) 26 (!) 21   Temp:    98.9 F (37.2 C)  TempSrc:    Oral  SpO2: 96% 95% 94%   Weight:      Height:        Intake/Output Summary (Last 24 hours) at 02/21/17 1152 Last data filed at 02/21/17 1100  Gross per 24 hour  Intake             1870 ml  Output             2750 ml  Net             -880 ml   Filed Weights   02/14/17 2000 02/14/17 2100 02/16/17 2230  Weight: 130 lb 4.7 oz (59.1 kg) 124 lb (56.2 kg) 131 lb 6.3 oz (59.6 kg)    Telemetry    NSR  ECG    02/17/17- AF with [id number]-156, diffuse ST changes- Personally Reviewed - Personally Reviewed  Physical Exam   GEN: No acute distress.   Neck: No  JVD Cardiac: RRR, no murmurs, rubs, or gallops.  Respiratory: decreased at bases GI: Soft, nontender, non-distended  MS: No edema; No deformity. Neuro:  Rt sided weakness, slight facial droop Psych: Normal affect   Labs    Chemistry Recent Labs Lab 02/14/17 2030  02/19/17 0228 02/20/17 0443 02/21/17 0235  NA 138  < > 134* 135 136  K 4.0  < > 2.8* 2.8* 3.2*  CL 107  < > 104 104 105  CO2 20*  < > 21* 22 25  GLUCOSE 96  < > 111* 97 108*  BUN 23*  < > 14 12 11   CREATININE 0.84  < > 0.48 0.62 0.66  CALCIUM 9.1  < > 8.1* 7.9* 8.3*  PROT 6.7  --   --   --   --   ALBUMIN 4.0  --   --   --   --   AST 25  --   --   --   --   ALT 20  --   --   --   --  ALKPHOS 58  --   --   --   --   BILITOT 0.4  --   --   --   --   GFRNONAA >60  < > >60 >60 >60  GFRAA >60  < > >60 >60 >60  ANIONGAP 11  < > 9 9 6   < > = values in this interval not displayed.   Hematology Recent Labs Lab 02/19/17 0228 02/20/17 0443 02/21/17 0235  WBC 12.0* 7.5 8.9  RBC 3.30* 3.14* 3.53*  HGB 10.3* 9.8* 10.7*  HCT 31.0* 29.4* 32.7*  MCV 93.9 93.6 92.6  MCH 31.2 31.2 30.3  MCHC 33.2 33.3 32.7  RDW 13.1 13.4 13.1  PLT 191 195 233    Cardiac Enzymes Recent Labs Lab 02/15/17 0800 02/15/17 1458 02/15/17 2015  TROPONINI <0.03 0.03* 0.03*    Recent Labs Lab 02/14/17 2033  TROPIPOC 0.02     BNPNo results for input(s): BNP, PROBNP in the last 168 hours.   DDimer No results for input(s): DDIMER in the last 168 hours.   Radiology    PCXR 02/18/17- FINDINGS: There is a small to moderate right pleural effusion. Airspace disease is seen in the right lower and right upper lobes. There is also some airspace disease in the medial left lower lobe. No pneumothorax.  IMPRESSION: Right much worse than left airspace disease has an appearance most worrisome for pneumonia. Associated small to moderate right pleural effusion is noted.   Cardiac Studies   Echo 02/16/17- Study Conclusions  - Left  ventricle: The cavity size was normal. There was moderate   concentric hypertrophy. Systolic function was vigorous. The   estimated ejection fraction was in the range of 65% to 70%. Wall   motion was normal; there were no regional wall motion   abnormalities. Doppler parameters are consistent with abnormal   left ventricular relaxation (grade 1 diastolic dysfunction).   Doppler parameters are consistent with elevated ventricular   end-diastolic filling pressure. - Aortic valve: Trileaflet; mildly thickened, mildly calcified   leaflets. There was mild stenosis. Mean gradient (S): 11 mm Hg.   Peak gradient (S): 17 mm Hg. Valve area (VTI): 1.45 cm^2. Valve   area (Vmax): 1.73 cm^2. Valve area (Vmean): 1.44 cm^2. - Mitral valve: Calcified annulus. Mildly thickened leaflets .   There was trivial regurgitation. - Left atrium: The atrium was normal in size. - Right ventricle: The cavity size was normal. Wall thickness was   normal. Systolic function was normal. - Right atrium: The atrium was normal in size. - Tricuspid valve: There was trivial regurgitation. - Pulmonary arteries: Systolic pressure was within the normal   range. - Inferior vena cava: The vessel was normal in size. - Pericardium, extracardiac: There was no pericardial effusion.  Impressions:  - No cardiac source of emboli was indentified.   Patient Profile     71 y.o. female with a PMH significant for CAD, s/p remote RCA PCI 2001, HTN, HLD, and stable multiple sclerosis. She presented to [facility] 02/15/17 with a L MCA occlusion. She is now s/p R vertebral arteriogram with complete revascularization of occluded L MCA and stent assisted angioplasty of proximal L ICA occlusion. While on telemetry she had documented PAF and anticoagulation was initiated.   Assessment & Plan    CVA- embolic Lt brain CVA treated with LMCA  PCI with retrieval device and Lt ICA angioplasty and stent. 02/15/17.   CAD- h/o RCA PCI 2001. An exercise  stress echo in 2016 demonstrated normal  heart muscle function - no evidence of coronary ischemia.  PAF- new diagnosis this admission. CHA2DS2/VAS Stroke Risk Score is 5 (vascular dz, age, stroke (2), female).  Eliquis was started but the pt had a reaction (rash) and this was changed to Xarelto.  HCVD- echo showed preserved LVF with moderate LVH and grade 1 DD.   HLD- on low dose Pravachol- LDL 74  Multiple sclerosis- stable  Plan:  Pt currently on Amiodarone PO, Lopressor, Lipitor 40 mg, Plavix and Xarelto. The pt and her husband request cardiology f/u with our group after discharge (Dr Meda Coffee). We will follow.   Angelena Form, PA-C  02/21/2017, 11:52 AM     Patient seen and examined. Agree with assessment and plan. Suspect embolic etiology to acute MCA infarct due to left CCA/ICA and MCA occlusion, s/p mechanical thrombectomy and left ICA stenting in the setting of newly diagnosed afib. Developed ?allergic rash  rxn to eliquis now on xarelto with hematuria noted. Maintaining NSR.   Troy Sine, MD, [facility] Inpatient 02/21/2017 1:32 PM

## 2017-02-21 NOTE — Progress Notes (Signed)
Pt arrived to 5C10, alert to self, husband at bedside.  No complaints of pain, in no apparent distress.  Telemetry applied and CCMD notified.  Will continue to monitor.  Sondra Come, RN

## 2017-02-21 NOTE — Progress Notes (Signed)
Report called to [facility] to [facility] RN. Patient going to room 10. Husband at bedside, all personal items accounted for.

## 2017-02-21 NOTE — Significant Event (Signed)
Called regarding new onset of hematuria. Collection device was changed with persistence of bright red urine. No visible clots per nursing. Next dose of Xarelto tomorrow at 5 PM. Will continue to monitor. May need to be switched back to Lovenox by AM team. If bleeding continues, will obtain CBC and urology consult.   Electronically signed: Dr. Caryl Pina

## 2017-02-21 NOTE — Progress Notes (Signed)
STROKE TEAM PROGRESS NOTE   SUBJECTIVE (INTERVAL HISTORY) Her husband is   at the bedside.  She  is neurologically stable. .She had transient hematuria which appears to be clearing up. Hematocrit appears to be stable. OBJECTIVE Temp:  [97.6 F (36.4 C)-99 F (37.2 C)] 98.6 F (37 C) (07/23 1451) Pulse Rate:  [61-86] 64 (07/23 1451) Cardiac Rhythm: Normal sinus rhythm (07/23 1200) Resp:  [15-34] 20 (07/23 1451) BP: (119-187)/(41-146) 180/59 (07/23 1451) SpO2:  [90 %-98 %] 98 % (07/23 1451)  CBC:  Recent Labs Lab 02/14/17 2030  02/15/17 0558  02/20/17 0443 02/21/17 0235  WBC 7.6  --  10.6*  < > 7.5 8.9  NEUTROABS 4.4  --  8.9*  --   --   --   HGB 12.2  < > 9.8*  < > 9.8* 10.7*  HCT 37.3  < > 29.9*  < > 29.4* 32.7*  MCV 96.6  --  97.1  < > 93.6 92.6  PLT 203  --  218  < > 195 233  < > = values in this interval not displayed.  Basic Metabolic Panel:   Recent Labs Lab 02/16/17 0830  02/18/17 0215  02/20/17 0443 02/21/17 0235  NA 141  < > 139  < > 135 136  K 4.3  < > 3.6  < > 2.8* 3.2*  CL 115*  < > 109  < > 104 105  CO2 21*  < > 21*  < > 22 25  GLUCOSE 114*  < > 114*  < > 97 108*  BUN 25*  < > 16  < > 12 11  CREATININE 0.65  < > 0.59  < > 0.62 0.66  CALCIUM 8.2*  < > 8.2*  < > 7.9* 8.3*  MG 2.1  --  2.1  --   --   --   < > = values in this interval not displayed.  Lipid Panel:     Component Value Date/Time   CHOL 173 02/15/2017 0420   TRIG 145 02/15/2017 0420   HDL 70 02/15/2017 0420   CHOLHDL 2.5 02/15/2017 0420   VLDL 29 02/15/2017 0420   LDLCALC 74 02/15/2017 0420   HgbA1c:  Lab Results  Component Value Date   HGBA1C 5.2 02/15/2017   Urine Drug Screen:     Component Value Date/Time   LABOPIA NONE DETECTED 02/14/2017 2124   COCAINSCRNUR NONE DETECTED 02/14/2017 2124   LABBENZ NONE DETECTED 02/14/2017 2124   AMPHETMU NONE DETECTED 02/14/2017 2124   THCU NONE DETECTED 02/14/2017 2124   LABBARB NONE DETECTED 02/14/2017 2124    Alcohol Level      Component Value Date/Time   ETH 152 (H) 02/14/2017 2030    IMAGING I have personally reviewed the radiological images below and agree with the radiology interpretations.  Ct Angio Head/Neck W and Wo Contrast 02/14/2017 IMPRESSION: CT head: 1. Focal hypodensity in left insula and left posterior putamen compatible with infarction. 2. Hyperdensity and distal left M1. 3. ASPECTS is 8 CTA neck: 1. Occlusion of the left common and internal carotid artery from the arch to the distal cavernous segment. 2. Patent right common carotid system and vertebral arteries without dissection, aneurysm, or significant stenosis. 3. Moderate calcific atherosclerosis of the aortic arch 4. Centrilobular emphysema of lung apices. CTA head: 1. Patent paraclinoid and terminal segments of left internal carotid artery and minimal flow in proximal left M1 likely retrograde via a tiny left posterior communicating artery. 2. Occlusion of  mid and distal left M1 with poor left MCA collateralization. 3. Left A1 occlusion. Bilateral A2 are patent with the left likely perfused via the anterior communicating artery. Symmetric distal ACA circulation. 4. Patent right MCA and posterior circulation. 5. No intracranial aneurysm identified.   Ct Head Wo Contrast 02/15/2017 IMPRESSION: 1. Contrast staining versus blood products LEFT basal ganglia to LEFT temporal lobe. Recommend 6-12 hour follow-up CT. 2. Moderate to severe chronic small vessel ischemic disease. T  Mr Brain 19 Contrast Mr Maxine Glenn Head/brain Wo Cm 02/15/2017 IMPRESSION: 1. Acute confluent infarct involving the left putamen, caudate body, and corona radiata. 2 tiny cortical infarcts in the left MCA distribution. 2. Extensive chronic white matter disease, some combination of patient's multiple sclerosis and chronic microvascular ischemia. 3. Continued good patency of recently re- cannulized left ICA and M1 segment. 4. Mild left M1 segment irregularity. Moderate left M2 inferior  division stenosis. 5. Moderate to advanced stenosis of the left V4 segment.   Ct Cerebral Perfusion W Contrast 02/14/2017 IMPRESSION: 1. Left MCA distribution perfusion anomaly. Ischemic penumbra of 33 cc calculated by mismatch volume. 2. 0 cc infarct core (CBF <30%) by perfusion. This does not included the infarct core volume of the ASPECT 8 infarct on non contrast CT of the head involving the part of the left insula and putamen as visible infarct on noncontrast CT can be pseudo normalized on perfusion.   Portable Chest Xray 02/15/2017 IMPRESSION: 1. ACDF of the lower cervical spine. 2. Satisfactory support line and tube positions. 3. Atelectasis and/or scarring at the left lung base. 4. Aortic atherosclerosis.  Ct Head Code Stroke W/o Cm 02/14/2017 IMPRESSION: CT head: 1. Focal hypodensity in left insula and left posterior putamen compatible with infarction. 2. Hyperdensity and distal left M1. 3. ASPECTS is 8 CTA neck: 1. Occlusion of the left common and internal carotid artery from the arch to the distal cavernous segment. 2. Patent right common carotid system and vertebral arteries without dissection, aneurysm, or significant stenosis. 3. Moderate calcific atherosclerosis of the aortic arch 4. Centrilobular emphysema of lung apices. CTA head: 1. Patent paraclinoid and terminal segments of left internal carotid artery and minimal flow in proximal left M1 likely retrograde via a tiny left posterior communicating artery. 2. Occlusion of mid and distal left M1 with poor left MCA collateralization. 3. Left A1 occlusion. Bilateral A2 are patent with the left likely perfused via the anterior communicating artery. Symmetric distal ACA circulation. 4. Patent right MCA and posterior circulation. 5. No intracranial aneurysm identified.    TTE - Left ventricle: The cavity size was normal. There was moderate   concentric hypertrophy. Systolic function was vigorous. The   estimated ejection fraction was in the  range of 65% to 70%. Wall   motion was normal; there were no regional wall motion   abnormalities. Doppler parameters are consistent with abnormal   left ventricular relaxation (grade 1 diastolic dysfunction).   Doppler parameters are consistent with elevated ventricular   end-diastolic filling pressure. - Aortic valve: Trileaflet; mildly thickened, mildly calcified   leaflets. There was mild stenosis. Mean gradient (S): 11 mm Hg.   Peak gradient (S): 17 mm Hg. Valve area (VTI): 1.45 cm^2. Valve   area (Vmax): 1.73 cm^2. Valve area (Vmean): 1.44 cm^2. - Mitral valve: Calcified annulus. Mildly thickened leaflets .   There was trivial regurgitation. - Left atrium: The atrium was normal in size. - Right ventricle: The cavity size was normal. Wall thickness was   normal. Systolic function  was normal. - Right atrium: The atrium was normal in size. - Tricuspid valve: There was trivial regurgitation. - Pulmonary arteries: Systolic pressure was within the normal   range. - Inferior vena cava: The vessel was normal in size. - Pericardium, extracardiac: There was no pericardial effusion. Impressions: - No cardiac source of emboli was indentified.  LE venous doppler There is no evidence of deep or superficial vein thrombosis involving the right and left lower extremities. All visualized vessels appear patent and compressible.  Incidental findings are consistent with a Baker's Cyst measuring 1.2 cm high by 3.5 cm wide by 3.1 cm long on the right.  Dg Chest Port 1 View 02/18/2017 IMPRESSION: Right much worse than left airspace disease has an appearance most worrisome for pneumonia. Associated small to moderate right pleural effusion is noted.    PHYSICAL EXAM  Temp:  [97.6 F (36.4 C)-99 F (37.2 C)] 98.6 F (37 C) (07/23 1451) Pulse Rate:  [61-86] 64 (07/23 1451) Resp:  [15-34] 20 (07/23 1451) BP: (119-187)/(41-146) 180/59 (07/23 1451) SpO2:  [90 %-98 %] 98 % (07/23 1451)  General -  Well nourished, well developed, elderly Caucasian lady Ophthalmologic - Fundi not visualized due to noncooperation.  Cardiovascular - Regular rate and rhythm.  Neuro - awake alert and interactive Eyes open, follows simple commands centrally and peripherally.mild dysarthria and expressive aphasia eye movements full range. Blinking to visual threat bilaterally. PERRL. Facial symmetry not able to assess due to ET tube. RUE 1/5, RLE 3/5. LUE at least 4/5, LLE 4/5. DTR 1+ and right positive babinski. Sensation, coordination and gait not tested.   ASSESSMENT/PLAN Kaitlin [name] is a 71 y.o. female with a  20 year history of MS, hyperlipidemia, and CAD with stent in 2000, who presented as a Code Stroke for right sided weakness and garbled speech. She did not receive IV t-PA due to arriving outside of the treatment window.   Stroke:  Acute MCA infarct due to left CCA/ICA and MCA occlusion, s/p mechanical thrombectomy and left ICA stenting in the setting of newly diagnosed afib  Resultant  mild aphasia, right hemiplegia  CT head: Contrast staining versus blood products LEFT basal ganglia to LEFT temporal lobe.   CTA head and neck: left CCA origin occlusion, A1 and distal M1 occlusion  MRI head: Acute confluent infarct involving the left putamen, caudate body, and corona radiata, as well as tiny cortical infarcts in the left MCA distribution.    MRA head: Mild left M1 segment irregularity, moderate left M2 inferior division stenosis.   2D Echo  EF 65-70%  LE venous doppler no PFO but baker's cyst  LDL 74  HgbA1c 5.2  lovenox for VTE prophylaxis DIET DYS 2 Room service appropriate? Yes; Fluid consistency: Thin  aspirin 81 mg daily prior to admission, now on aspirin 81 and brilinta 90 mg bid due to high P2Y12 level. Cardiology recommend eliquis with plavix once pt stable for anticoagulation. I discussed with Dr. Corliss Skains and he is in agreement.   Patient counseled to be compliant with her  antithrombotic medications  Ongoing aggressive stroke risk factor management  Therapy recommendations:  pending  Disposition:  Pending  PAF with RVR  new diagnosis likely the cause for stroke  Off amiodarone drip and still on esmolol drip  Cardiology on board  Put on metoprolol for rate control   Eliquis for anticoagulation and Plavix for stent  Left CCA/ICA occlusion s/p thrombectomy  Likely due to undiagnosed afib PTA  S/p left ICA  stent  Respiratory distress  Likely due to aspiration pneumonia  CXR concerning for pneumonia  CCM on board  Put on Levaquin  On high flow oxygen mask  Continue ICU monitoring  Hypertension  Stable  Permissive hypertension (OK if <180/105) and gradually normalized within 5-7 days.  Long-term blood pressure goal normotensive  Hypotension on admission and postprocedure resolved  Hyperlipidemia  Home meds: none  LDL 74, goal < 70  Add atorvastatin 40mg  PO daily  Continue statin at discharge  Dysphagia   dys 2 diet with thin liquid  Speech is following  Other Stroke Risk Factors  Advanced age  ETOH use, advised to drink no more than 1 drink(s) a day  Coronary artery disease  Other Active Problems  Sinus bradycardia - resolved  Multiple sclerosis: follows neurology in [place], was on Betaseron, no flare up, took off 11/2015 due to elevated LFT  Right Baker's cyst  Hospital day # 7  Plan   continue Xarelto. Monitor hematuria. Long discussion at the bedside with the patient's husband and answered questions. Transfer to floor bed when available.  Appreciate cardiology help Replace potassium This patient is critically ill due to CCA occlusion, MCA occlusion, s/p mechanical thrombectomy and ICA stent, afib RVR, dysphagia, respiratory distress and at significant risk of neurological worsening, death form recurrent stroke, hemorrhagic conversion, seizure, cerebral edema, heart failure, aspiration pneumonia, and  respiratory failure. This patient's care requires constant monitoring of vital signs, hemodynamics, respiratory and cardiac monitoring, review of multiple databases, neurological assessment, discussion with family, other specialists and medical decision making of high complexity. I spent 30 minutes of neurocritical care time in the care of this patient. I had long discussion with husband at bedside, updated pt current condition, treatment plan and potential prognosis. He expressed understanding and appreciation.   Kaitlin [name],, MD   Stroke Neurology 02/21/2017 2:59 PM   To contact Stroke Continuity provider, please refer to [email or web address]. After hours, contact General Neurology

## 2017-02-21 NOTE — Progress Notes (Signed)
  Speech Language Pathology Treatment: Dysphagia;Cognitive-Linquistic  Patient Details Name: Kaitlin [name] MRN: [id number] DOB: 07/13/1946 Today's Date: 02/21/2017 Time: 6812-7517 SLP Time Calculation (min) (ACUTE ONLY): 18 min  Assessment / Plan / Recommendation Clinical Impression  Pt sleepy but agreeable for swallow and language therapy. Sitter reported she coughs sometimes with meals and pot roast was hard to masticate today. Mildly inefficient and effortful mastication with regular texture. No s/s aspiration with thin via cup. Pt is not ready to progress textures and will continue Dys 2 for now.   Fluency is improving at sentence level. Continues with neologisms intermittently. Named common objects 3/6 independently (stimulable with phonemic cues). Decreased awareness of accuracy of language (ex., Husband and therapist discussing pt's invisiline braces earlier and when a pt was asked to describe something she did today she responded "I went to the dentist today"). Received phone call from friend during session expression a mixture of accurate and linguistically correct phrases mixed with inaccuracies/decreased semantics.    HPI HPI: Patient's 71 year old female with past medical history significant for multiple sclerosis, high cholesterol and ACDF (age indeterminate) admitted with stuttering symptoms, somewhat resolved and right hand weakness. MRI showed acute confluent infarct involving the left putamen, caudate body, and corona radiata, tiny cortical infarcts in the left MCA distribution, extensive chronic white matter disease, some combination of patient's multiple sclerosis and chronic microvascular ischemia. Pt underwent revascularization and left ICA angioplasty stent 7/17. RN reports intubated total of 30 hours.       SLP Plan  Continue with current plan of care       Recommendations  Diet recommendations: Dysphagia 2 (fine chop);Thin liquid Liquids provided via: Cup;No  straw Medication Administration: Crushed with puree Supervision: Patient able to self feed;Full supervision/cueing for compensatory strategies Compensations: Slow rate;Small sips/bites;Minimize environmental distractions;Multiple dry swallows after each bite/sip Postural Changes and/or Swallow Maneuvers: Seated upright 90 degrees                General recommendations: Rehab consult Oral Care Recommendations: Oral care BID Follow up Recommendations: Inpatient Rehab SLP Visit Diagnosis: Dysphagia, oropharyngeal phase (R13.12) Plan: Continue with current plan of care       GO                Royce Macadamia 02/21/2017, 3:30 PM  Breck Coons Lonell Face[employer] [phone]

## 2017-02-21 NOTE — Plan of Care (Signed)
Problem: Coping: Goal: Ability to verbalize positive feelings about self will improve Outcome: Progressing Patient continues to improve and is better able to verbalize and communicate. She is able to recognize when she has accomplished a new goal and is aware that she needs to continue to work with therapy to achieve maximum recovery. Husband is at bedside and is very encouraging.

## 2017-02-21 NOTE — Progress Notes (Signed)
Physical Therapy Treatment Patient Details Name: Kaitlin [name] MRN: [id number] DOB: June 07, 1946 Today's Date: 02/21/2017    History of Present Illness Pt is a 71 y/o female with a PMH significant for multiple sclerosis. She [facility]17 with R-sided weakness and aphasia. CT revealed L MCA occlusion. She is now s/p R vertebral arteriogram with complete revascularization of occluded L MCA and stent assisted angioplasty of proximal L ICA occlusion. She was intubated 7/17-7/18.    PT Comments    Pt progressing towards physical therapy goals. Was able to ambulate in the hall today with +2 assist for balance support, sequencing, and general safety. 1 seated rest required 2 fatigue. Husband present this session and very happy and tearful with pt's progress. Continue to recommend CIR level therapies at d/c to maximize functional independence and safety with mobility.    Follow Up Recommendations  CIR;Supervision/Assistance - 24 hour     Equipment Recommendations   (TBD by next venue of care)    Recommendations for Other Services Rehab consult     Precautions / Restrictions Precautions Precautions: Fall Precaution Comments: R side inattention Restrictions Weight Bearing Restrictions: No    Mobility  Bed Mobility Overal bed mobility: Needs Assistance Bed Mobility: Supine to Sit     Supine to sit: Mod assist;+2 for safety/equipment     General bed mobility comments: Pt able to initiate B LE off of bed and requiring mod assist to pull trunk into upright position.   Transfers Overall transfer level: Needs assistance Equipment used: 2 person hand held assist Transfers: Sit to/from Stand Sit to Stand: Mod assist;+2 safety/equipment         General transfer comment: VC's for hand placement on seated surface for safety. Pt initiated rise to stand without assist, however required therapist assist for completion of power-up to full standing position. Once standing, pt  immediately initiated ambulation. Required multimodal cues to slow pt and reposition feet before initiation of ambulation.   Ambulation/Gait Ambulation/Gait assistance: Mod assist;+2 physical assistance;+2 safety/equipment Ambulation Distance (Feet): 75 Feet Assistive device: 2 person hand held assist Gait Pattern/deviations: Decreased stance time - right;Decreased step length - right;Decreased step length - left Gait velocity: Decreased Gait velocity interpretation: Below normal speed for age/gender General Gait Details: Multimodal cues for upright posture and stability in R stance; assist to advance R foot in swing; noted improving ability to organize center of mass over feet   Stairs            Wheelchair Mobility    Modified Rankin (Stroke Patients Only) Modified Rankin (Stroke Patients Only) Pre-Morbid Rankin Score: No symptoms Modified Rankin: Moderately severe disability     Balance Overall balance assessment: Needs assistance Sitting-balance support: Feet supported;No upper extremity supported Sitting balance-Leahy Scale: Fair     Standing balance support: Bilateral upper extremity supported;During functional activity Standing balance-Leahy Scale: Poor Standing balance comment: +2 assist required                             Cognition Arousal/Alertness: Awake/alert Behavior During Therapy: Flat affect Overall Cognitive Status: Impaired/Different from baseline Area of Impairment: Orientation;Attention;Memory;Following commands;Safety/judgement;Awareness;Problem solving                 Orientation Level: Disoriented to;Situation Current Attention Level: Selective Memory: Decreased short-term memory;Decreased recall of precautions Following Commands: Follows one step commands with increased time Safety/Judgement: Decreased awareness of safety;Decreased awareness of deficits Awareness: Emergent Problem Solving: Slow processing;Decreased  initiation;Difficulty sequencing;Requires verbal cues;Requires tactile cues General Comments: Pt able to follow one-step commands during session and requiring increased time and multimodal cues as session progressed likely due to fatigue.       Exercises      General Comments        Pertinent Vitals/Pain Pain Assessment: Faces Faces Pain Scale: No hurt Pain Intervention(s): Monitored during session    Home Living                      Prior Function            PT Goals (current goals can now be found in the care plan section) Acute Rehab PT Goals Patient Stated Goal: Family goal is to return to PLOF PT Goal Formulation: With family Time For Goal Achievement: 03/03/17 Potential to Achieve Goals: Good Progress towards PT goals: Progressing toward goals    Frequency    Min 3X/week      PT Plan Current plan remains appropriate    Co-evaluation              AM-PAC PT "6 Clicks" Daily Activity  Outcome Measure  Difficulty turning over in bed (including adjusting bedclothes, sheets and blankets)?: Total Difficulty moving from lying on back to sitting on the side of the bed? : Total Difficulty sitting down on and standing up from a chair with arms (e.g., wheelchair, bedside commode, etc,.)?: Total Help needed moving to and from a bed to chair (including a wheelchair)?: A Lot Help needed walking in hospital room?: A Lot Help needed climbing 3-5 steps with a railing? : Total 6 Click Score: 8    End of Session Equipment Utilized During Treatment: Gait belt Activity Tolerance: Patient tolerated treatment well Patient left: in chair;with call bell/phone within reach;with chair alarm set Nurse Communication: Mobility status PT Visit Diagnosis: Unsteadiness on feet (R26.81);Hemiplegia and hemiparesis Hemiplegia - Right/Left: Right Hemiplegia - dominant/non-dominant: Non-dominant Hemiplegia - caused by: Cerebral infarction     Time: 1610-9604 PT Time  Calculation (min) (ACUTE ONLY): 25 min  Charges:  $Gait Training: 23-37 mins                    G Codes:       Conni Slipper, PT, DPT Acute Rehabilitation Services Pager: [phone]    Marylynn Pearson 02/21/2017, 10:48 AM

## 2017-02-22 ENCOUNTER — Encounter (HOSPITAL_COMMUNITY): Payer: Self-pay | Admitting: Physical Medicine and Rehabilitation

## 2017-02-22 ENCOUNTER — Inpatient Hospital Stay (HOSPITAL_COMMUNITY)
Admission: RE | Admit: 2017-02-22 | Discharge: 2017-03-19 | DRG: 056 | Disposition: A | Discharge status: Home Health | Payer: Medicare Other | Source: Intra-hospital | Attending: Physical Medicine & Rehabilitation | Admitting: Physical Medicine & Rehabilitation

## 2017-02-22 ENCOUNTER — Encounter (HOSPITAL_COMMUNITY): Payer: Self-pay | Admitting: *Deleted

## 2017-02-22 DIAGNOSIS — Z820 Family history of epilepsy and other diseases of the nervous system: Secondary | ICD-10-CM

## 2017-02-22 DIAGNOSIS — D72829 Elevated white blood cell count, unspecified: Secondary | ICD-10-CM | POA: Diagnosis not present

## 2017-02-22 DIAGNOSIS — R0989 Other specified symptoms and signs involving the circulatory and respiratory systems: Secondary | ICD-10-CM

## 2017-02-22 DIAGNOSIS — I63512 Cerebral infarction due to unspecified occlusion or stenosis of left middle cerebral artery: Secondary | ICD-10-CM | POA: Diagnosis present

## 2017-02-22 DIAGNOSIS — I69391 Dysphagia following cerebral infarction: Secondary | ICD-10-CM

## 2017-02-22 DIAGNOSIS — E7800 Pure hypercholesterolemia, unspecified: Secondary | ICD-10-CM | POA: Diagnosis present

## 2017-02-22 DIAGNOSIS — R269 Unspecified abnormalities of gait and mobility: Secondary | ICD-10-CM | POA: Diagnosis not present

## 2017-02-22 DIAGNOSIS — G8191 Hemiplegia, unspecified affecting right dominant side: Secondary | ICD-10-CM | POA: Diagnosis not present

## 2017-02-22 DIAGNOSIS — J439 Emphysema, unspecified: Secondary | ICD-10-CM | POA: Diagnosis present

## 2017-02-22 DIAGNOSIS — I2510 Atherosclerotic heart disease of native coronary artery without angina pectoris: Secondary | ICD-10-CM | POA: Diagnosis present

## 2017-02-22 DIAGNOSIS — Z888 Allergy status to other drugs, medicaments and biological substances status: Secondary | ICD-10-CM

## 2017-02-22 DIAGNOSIS — I69353 Hemiplegia and hemiparesis following cerebral infarction affecting right non-dominant side: Secondary | ICD-10-CM | POA: Diagnosis not present

## 2017-02-22 DIAGNOSIS — R000 Tachycardia, unspecified: Secondary | ICD-10-CM | POA: Diagnosis not present

## 2017-02-22 DIAGNOSIS — Z79891 Long term (current) use of opiate analgesic: Secondary | ICD-10-CM

## 2017-02-22 DIAGNOSIS — D62 Acute posthemorrhagic anemia: Secondary | ICD-10-CM | POA: Diagnosis present

## 2017-02-22 DIAGNOSIS — I69320 Aphasia following cerebral infarction: Secondary | ICD-10-CM

## 2017-02-22 DIAGNOSIS — I119 Hypertensive heart disease without heart failure: Secondary | ICD-10-CM | POA: Diagnosis present

## 2017-02-22 DIAGNOSIS — D72828 Other elevated white blood cell count: Secondary | ICD-10-CM | POA: Diagnosis present

## 2017-02-22 DIAGNOSIS — G4700 Insomnia, unspecified: Secondary | ICD-10-CM | POA: Diagnosis present

## 2017-02-22 DIAGNOSIS — E876 Hypokalemia: Secondary | ICD-10-CM | POA: Diagnosis present

## 2017-02-22 DIAGNOSIS — I350 Nonrheumatic aortic (valve) stenosis: Secondary | ICD-10-CM | POA: Diagnosis present

## 2017-02-22 DIAGNOSIS — I480 Paroxysmal atrial fibrillation: Secondary | ICD-10-CM | POA: Diagnosis present

## 2017-02-22 DIAGNOSIS — I69322 Dysarthria following cerebral infarction: Secondary | ICD-10-CM

## 2017-02-22 DIAGNOSIS — R32 Unspecified urinary incontinence: Secondary | ICD-10-CM | POA: Diagnosis present

## 2017-02-22 DIAGNOSIS — I69398 Other sequelae of cerebral infarction: Secondary | ICD-10-CM

## 2017-02-22 DIAGNOSIS — R1312 Dysphagia, oropharyngeal phase: Secondary | ICD-10-CM | POA: Diagnosis present

## 2017-02-22 DIAGNOSIS — G35 Multiple sclerosis: Secondary | ICD-10-CM | POA: Diagnosis present

## 2017-02-22 DIAGNOSIS — Z8249 Family history of ischemic heart disease and other diseases of the circulatory system: Secondary | ICD-10-CM | POA: Diagnosis not present

## 2017-02-22 DIAGNOSIS — J690 Pneumonitis due to inhalation of food and vomit: Secondary | ICD-10-CM | POA: Diagnosis present

## 2017-02-22 DIAGNOSIS — Z880 Allergy status to penicillin: Secondary | ICD-10-CM

## 2017-02-22 DIAGNOSIS — R2689 Other abnormalities of gait and mobility: Secondary | ICD-10-CM | POA: Diagnosis present

## 2017-02-22 DIAGNOSIS — Z955 Presence of coronary angioplasty implant and graft: Secondary | ICD-10-CM | POA: Diagnosis not present

## 2017-02-22 DIAGNOSIS — R001 Bradycardia, unspecified: Secondary | ICD-10-CM | POA: Diagnosis not present

## 2017-02-22 DIAGNOSIS — Z7982 Long term (current) use of aspirin: Secondary | ICD-10-CM

## 2017-02-22 DIAGNOSIS — I69919 Unspecified symptoms and signs involving cognitive functions following unspecified cerebrovascular disease: Secondary | ICD-10-CM | POA: Diagnosis not present

## 2017-02-22 DIAGNOSIS — Z79899 Other long term (current) drug therapy: Secondary | ICD-10-CM

## 2017-02-22 DIAGNOSIS — E8809 Other disorders of plasma-protein metabolism, not elsewhere classified: Secondary | ICD-10-CM | POA: Diagnosis present

## 2017-02-22 DIAGNOSIS — Z09 Encounter for follow-up examination after completed treatment for conditions other than malignant neoplasm: Secondary | ICD-10-CM

## 2017-02-22 MED ORDER — POTASSIUM CHLORIDE CRYS ER 20 MEQ PO TBCR
20.0000 meq | EXTENDED_RELEASE_TABLET | Freq: Two times a day (BID) | ORAL | Status: DC
  Administered 2017-02-22 – 2017-03-08 (×28): 20 meq via ORAL
  Filled 2017-02-22 (×28): qty 1

## 2017-02-22 MED ORDER — METOPROLOL TARTRATE 50 MG PO TABS
50.0000 mg | ORAL_TABLET | Freq: Two times a day (BID) | ORAL | Status: DC
  Administered 2017-02-22 – 2017-03-19 (×43): 50 mg via ORAL
  Filled 2017-02-22 (×50): qty 1

## 2017-02-22 MED ORDER — PROSIGHT PO TABS
1.0000 | ORAL_TABLET | Freq: Two times a day (BID) | ORAL | Status: DC
  Administered 2017-02-22 – 2017-03-19 (×50): 1 via ORAL
  Filled 2017-02-22 (×50): qty 1

## 2017-02-22 MED ORDER — FLUOXETINE HCL 10 MG PO CAPS
10.0000 mg | ORAL_CAPSULE | Freq: Every day | ORAL | Status: DC
  Administered 2017-02-23 – 2017-03-19 (×25): 10 mg via ORAL
  Filled 2017-02-22 (×25): qty 1

## 2017-02-22 MED ORDER — POLYETHYLENE GLYCOL 3350 17 G PO PACK: 17.0000 g | PACK | Freq: Every day | ORAL | Status: DC | PRN

## 2017-02-22 MED ORDER — CLOPIDOGREL BISULFATE 75 MG PO TABS
75.0000 mg | ORAL_TABLET | Freq: Every day | ORAL | Status: DC
  Administered 2017-02-23 – 2017-03-19 (×25): 75 mg via ORAL
  Filled 2017-02-22 (×25): qty 1

## 2017-02-22 MED ORDER — PANTOPRAZOLE SODIUM 40 MG PO TBEC
40.0000 mg | DELAYED_RELEASE_TABLET | Freq: Every day | ORAL | Status: DC
  Administered 2017-02-23 – 2017-03-19 (×25): 40 mg via ORAL
  Filled 2017-02-22 (×25): qty 1

## 2017-02-22 MED ORDER — ATORVASTATIN CALCIUM 40 MG PO TABS
40.0000 mg | ORAL_TABLET | Freq: Every day | ORAL | Status: DC
  Administered 2017-02-22 – 2017-03-18 (×24): 40 mg via ORAL
  Filled 2017-02-22 (×25): qty 1

## 2017-02-22 MED ORDER — PROCHLORPERAZINE EDISYLATE 5 MG/ML IJ SOLN: 5.0000 mg | Freq: Four times a day (QID) | INTRAMUSCULAR | Status: DC | PRN

## 2017-02-22 MED ORDER — DIPHENHYDRAMINE HCL 12.5 MG/5ML PO ELIX
12.5000 mg | ORAL_SOLUTION | Freq: Four times a day (QID) | ORAL | Status: DC | PRN
  Administered 2017-02-23: 25 mg via ORAL
  Filled 2017-02-22: qty 10

## 2017-02-22 MED ORDER — GUAIFENESIN-DM 100-10 MG/5ML PO SYRP: 5.0000 mL | ORAL_SOLUTION | Freq: Four times a day (QID) | ORAL | Status: DC | PRN

## 2017-02-22 MED ORDER — AMIODARONE HCL 200 MG PO TABS
200.0000 mg | ORAL_TABLET | Freq: Every day | ORAL | Status: DC
  Administered 2017-02-23 – 2017-03-19 (×25): 200 mg via ORAL
  Filled 2017-02-22 (×25): qty 1

## 2017-02-22 MED ORDER — PROCHLORPERAZINE 25 MG RE SUPP: 12.5000 mg | Freq: Four times a day (QID) | RECTAL | Status: DC | PRN

## 2017-02-22 MED ORDER — VENLAFAXINE HCL ER 75 MG PO CP24
75.0000 mg | ORAL_CAPSULE | Freq: Every day | ORAL | Status: DC
  Administered 2017-02-23 – 2017-03-19 (×25): 75 mg via ORAL
  Filled 2017-02-22 (×25): qty 1

## 2017-02-22 MED ORDER — FLEET ENEMA 7-19 GM/118ML RE ENEM: 1.0000 | ENEMA | Freq: Once | RECTAL | Status: DC | PRN

## 2017-02-22 MED ORDER — ALUM & MAG HYDROXIDE-SIMETH 200-200-20 MG/5ML PO SUSP: 30.0000 mL | ORAL | Status: DC | PRN

## 2017-02-22 MED ORDER — BISACODYL 10 MG RE SUPP
10.0000 mg | Freq: Every day | RECTAL | Status: DC | PRN
  Administered 2017-03-01: 10 mg via RECTAL
  Filled 2017-02-22: qty 1

## 2017-02-22 MED ORDER — LEVOFLOXACIN 750 MG PO TABS
750.0000 mg | ORAL_TABLET | Freq: Every day | ORAL | Status: DC
  Filled 2017-02-22: qty 1

## 2017-02-22 MED ORDER — LEVOFLOXACIN 500 MG PO TABS
750.0000 mg | ORAL_TABLET | Freq: Every day | ORAL | Status: DC
  Administered 2017-02-22 – 2017-02-26 (×5): 750 mg via ORAL
  Filled 2017-02-22 (×6): qty 2

## 2017-02-22 MED ORDER — ACETAMINOPHEN 325 MG PO TABS: 650.0000 mg | ORAL_TABLET | ORAL | Status: DC | PRN

## 2017-02-22 MED ORDER — PROCHLORPERAZINE MALEATE 5 MG PO TABS: 5.0000 mg | ORAL_TABLET | Freq: Four times a day (QID) | ORAL | Status: DC | PRN

## 2017-02-22 MED ORDER — CALCIUM CARBONATE-VITAMIN D 500-200 MG-UNIT PO TABS
1.0000 | ORAL_TABLET | Freq: Every day | ORAL | Status: DC
  Administered 2017-02-23 – 2017-03-19 (×25): 1 via ORAL
  Filled 2017-02-22 (×25): qty 1

## 2017-02-22 MED ORDER — TRAZODONE HCL 50 MG PO TABS
50.0000 mg | ORAL_TABLET | Freq: Every day | ORAL | Status: DC
  Administered 2017-02-22 – 2017-03-18 (×25): 50 mg via ORAL
  Filled 2017-02-22 (×25): qty 1

## 2017-02-22 MED ORDER — POTASSIUM CHLORIDE CRYS ER 20 MEQ PO TBCR
20.0000 meq | EXTENDED_RELEASE_TABLET | Freq: Once | ORAL | Status: AC
  Administered 2017-02-22: 20 meq via ORAL
  Filled 2017-02-22: qty 1

## 2017-02-22 MED ORDER — RIVAROXABAN 20 MG PO TABS
20.0000 mg | ORAL_TABLET | Freq: Every day | ORAL | Status: DC
  Administered 2017-02-22 – 2017-02-28 (×7): 20 mg via ORAL
  Filled 2017-02-22 (×7): qty 1

## 2017-02-22 MED ORDER — ACETAMINOPHEN 325 MG PO TABS
325.0000 mg | ORAL_TABLET | ORAL | Status: DC | PRN
  Filled 2017-02-22 (×2): qty 2

## 2017-02-22 MED ORDER — TRAZODONE HCL 50 MG PO TABS: 25.0000 mg | ORAL_TABLET | Freq: Every evening | ORAL | Status: DC | PRN

## 2017-02-22 MED ORDER — CHLORHEXIDINE GLUCONATE 0.12% ORAL RINSE (MEDLINE KIT)
15.0000 mL | Freq: Two times a day (BID) | OROMUCOSAL | Status: DC
  Administered 2017-02-22 – 2017-03-10 (×23): 15 mL via OROMUCOSAL

## 2017-02-22 NOTE — H&P (Signed)
Physical Medicine and Rehabilitation Admission H&P    Chief Complaint  Patient presents with  . Right sided weakness and difficulty speaking.     HPI:  Kaitlin [name] is a 71 y.o. female with history of CAD s/p PTCA, MS with gait disorder/memory loss, PAF who was admitted on 02/14/17 with reports of speech difficulty progressing to right sided weakness later that day. CTA  head showed evidence of infarct left insular and left posterior putamen with  occlusion of L-CCA and L-ICA from arch to distal cavernous segment and incidental finding of centrilobar emphysema in lung apices. She underwent cerebral angio with complete revascularization of occluded L-MCA with one pass Solitaire and stent assisted angioplasty of proximal L-ICA with IA integrelin.  Follow up MRI brain done revealing acute confluent infarct involving the left putamen, caudate body, and corona radiata,  2 tiny cortical infarcts in the left MCA distribution and extensive white matter disease due to MS and chronic microvascular ischemia.  She tolerated extubation on 7/18 but was found to develop A fib with RVR requiring amiodarone and esmolol. Dr. Meda Coffee recommended Eliquis bid for A fib (as likely cause of stroke)  and changing Brilinta to Plavix.   BLE dopplers negative for DVT. 2 D echo revealed EF 65-70/5 with grade 1 diastolic dysfunction, mild aortic stenosis, mildly thickened MV and no wall abnormality.  She was started on dysphagia 2, thin liquids due to mild oropharyngeal dysphagia with stasis.  She developed respiratory distress requiring NRB on 7/20 and CXR revealed airspace disease RUL/RLL felt to be due to aspiration. She was started on Levaquin for treatment and    Review of Systems  Unable to perform ROS: Mental acuity      Past Medical History:  Diagnosis Date  . CAD S/P percutaneous coronary angioplasty 2001  . CVA (cerebral vascular accident) ([place]) 01/2017  . HCVD (hypertensive cardiovascular disease) 01/2017     normal LVF with moderate LVH, grade 1 DD  . High cholesterol   . Multiple sclerosis ([place])   . PAF (paroxysmal atrial fibrillation) ([place]) 01/2017    Past Surgical History:  Procedure Laterality Date  . CORONARY ANGIOPLASTY WITH STENT PLACEMENT  11/1999   [facility] DES[facility]  . IR ANGIO INTRA EXTRACRAN SEL COM CAROTID INNOMINATE UNI R MOD SED  02/15/2017  . IR ANGIO VERTEBRAL SEL SUBCLAVIAN INNOMINATE UNI R MOD SED  02/15/2017  . IR INTRAVSC STENT CERV CAROTID W/O EMB-PROT MOD SED INC ANGIO  02/15/2017  . IR PERCUTANEOUS ART THROMBECTOMY/INFUSION INTRACRANIAL INC DIAG ANGIO  02/15/2017  . NECK SURGERY    . RADIOLOGY WITH ANESTHESIA N/A 02/14/2017   Procedure: RADIOLOGY WITH ANESTHESIA;  Surgeon: Radiologist, Medication, MD;  Location: [place];  Service: Radiology;  Laterality: N/A;  . WRIST SURGERY      Family History  Problem Relation Age of Onset  . Heart disease Father   . Cancer Father   . Multiple sclerosis Sister   . Cancer Mother      Social History:  Married. Disabled Pharmacist, hospital. She  reports that she has never smoked. She has never used smokeless tobacco. She reports that she drinks about 1.8 oz of alcohol per week . She reports that she does not use drugs.    Allergies  Allergen Reactions  . Eliquis [Apixaban] Rash  . Penicillins     Medications Prior to Admission  Medication Sig Dispense Refill  . aspirin EC 81 MG tablet Take 81 mg by mouth daily.    Marland Kitchen  Calcium Citrate-Vitamin D (CALCIUM CITRATE + D PO) Take 1 tablet by mouth daily.    Marland Kitchen FLUoxetine (PROZAC) 10 MG capsule Take 10 mg by mouth daily.    Marland Kitchen gabapentin (NEURONTIN) 300 MG capsule Take 300 mg by mouth as needed.  0  . Multiple Vitamins-Minerals (PRESERVISION AREDS PO) Take 1 tablet by mouth 2 (two) times daily.    Marland Kitchen omega-3 acid ethyl esters (LOVAZA) 1 g capsule Take 1 g by mouth 2 (two) times daily. 1200 mg    . pravastatin (PRAVACHOL) 10 MG tablet Take 10 mg by mouth daily.     Marland Kitchen venlafaxine XR (EFFEXOR-XR) 75 MG  24 hr capsule Take 75 mg by mouth daily.    . traMADol (ULTRAM) 50 MG tablet Take 1 tablet (50 mg total) by mouth every 6 (six) hours as needed for pain. (Patient not taking: Reported on 02/14/2017) 15 tablet 0    Home: Home Living Family/patient expects to be discharged to:: Inpatient rehab Living Arrangements: Spouse/significant other Available Help at Discharge: Family, Available 24 hours/day (no children) Type of Home: House Home Access: Stairs to enter [employer] of Steps: 4 Entrance Stairs-Rails: Right Home Layout: Multi-level Alternate Level Stairs-Number of Steps: 1 Bathroom Shower/Tub: Tub only, Multimedia programmer: Standard Bathroom Accessibility: Yes Home Equipment: Shower seat - built in  Lives With: Spouse   Functional History: Prior Function Level of Independence: Independent Comments: Pt gardening the morning she was admitted  Functional Status:  Mobility: Bed Mobility Overal bed mobility: Needs Assistance Bed Mobility: Supine to Sit, Sit to Supine Supine to sit: Mod assist Sit to supine: Mod assist General bed mobility comments: Patient able to move LEs to EOb, assist for UE pull to sit, moderate assist to rotate trunk and elevate to upright. Assist to elevated LEs back to bed Transfers Overall transfer level: Needs assistance Equipment used: 2 person hand held assist Transfers: Sit to/from Stand Sit to Stand: Mod assist, +2 safety/equipment Stand pivot transfers: Mod assist, +2 physical assistance, +2 safety/equipment Squat pivot transfers: Mod assist, +2 physical assistance, +2 safety/equipment General transfer comment: VCs for hand placement. Manual faciliatation of push up throughout R UE,  moderate assist to power up and maintain stablity in standing. Performed several times during session Ambulation/Gait Ambulation/Gait assistance: Mod assist, +2 physical assistance, +2 safety/equipment Ambulation Distance (Feet): 30 Feet (3  trials) Assistive device: 2 person hand held assist Gait Pattern/deviations: Decreased stance time - right, Decreased step length - right, Decreased step length - left General Gait Details: multimodal cues for facilitation of upright posture, weight shift, stride and pacing. Patient fatigues quickly without UE support.  Gait velocity: Decreased Gait velocity interpretation: Below normal speed for age/gender    ADL: ADL Overall ADL's : Needs assistance/impaired Eating/Feeding: Moderate assistance, Sitting Grooming: Minimal assistance, Sitting, Cueing for safety, Cueing for sequencing, Moderate assistance Grooming Details (indicate cue type and reason): Min assist for single handed tasks after set-up; mod assist for bimanual tasks Upper Body Bathing: Moderate assistance, Sitting Lower Body Bathing: Maximal assistance, Sit to/from stand Upper Body Dressing : Moderate assistance, Sitting Lower Body Dressing: Maximal assistance, Sit to/from stand Toilet Transfer: Moderate assistance, +2 for physical assistance, +2 for safety/equipment, Stand-pivot Toileting- Clothing Manipulation and Hygiene: Maximal assistance, Sit to/from stand Functional mobility during ADLs: Moderate assistance, +2 for physical assistance General ADL Comments: Pt able to sit at EOB to brush her hair with VC's for sequence.   Cognition: Cognition Overall Cognitive Status: Impaired/Different from baseline Arousal/Alertness: Lethargic Orientation  Level: Oriented to person, Disoriented to situation, Disoriented to time, Disoriented to place Attention: Sustained Sustained Attention: Impaired Sustained Attention Impairment: Verbal basic Memory:  (TBA) Awareness: Impaired Awareness Impairment: Anticipatory impairment, Emergent impairment Problem Solving: Impaired Problem Solving Impairment: Functional basic Executive Function: Self Monitoring, Self Correcting Safety/Judgment: Impaired Cognition Arousal/Alertness:  Awake/alert Behavior During Therapy: Flat affect Overall Cognitive Status: Impaired/Different from baseline Area of Impairment: Orientation, Attention, Memory, Following commands, Safety/judgement, Awareness, Problem solving Orientation Level: Disoriented to, Situation Current Attention Level: Selective Memory: Decreased short-term memory, Decreased recall of precautions Following Commands: Follows one step commands with increased time Safety/Judgement: Decreased awareness of safety, Decreased awareness of deficits Awareness: Emergent Problem Solving: Slow processing, Decreased initiation, Difficulty sequencing, Requires verbal cues, Requires tactile cues General Comments: following commands throughout session more consistently today; increased FOF throughout activity   Blood pressure (!) 128/39, pulse (!) 58, temperature 98.9 F (37.2 C), temperature source Oral, resp. rate 16, height 5' 2"  (1.575 m), weight 59.6 kg (131 lb 6.3 oz), SpO2 98 %. Physical Exam  Constitutional: She appears well-developed and well-nourished.  HENT:  Head: Normocephalic and atraumatic.  Eyes: Pupils are equal, round, and reactive to light. Right eye exhibits no discharge. Left eye exhibits discharge. No scleral icterus.  Neck: No tracheal deviation present. No thyromegaly present.  Cardiovascular: Normal rate and regular rhythm.  Exam reveals no gallop and no friction rub.   No murmur heard. Respiratory: Effort normal. No respiratory distress. She has no rales.  GI: Soft. She exhibits no distension. There is no tenderness. There is no rebound.  Musculoskeletal: She exhibits no edema.  Neurological: She displays normal reflexes.  Lethargic but awakens fairly easily. Right central 7. Decreased sensation around right face and to a lesser extent right arm/leg.LUE and LLE 4/5. RUE: 2 to 2+/5 prox to distal. RLE: 3- to 3/5 prox to distal---inconsistent effort. Delayed processing and some expressive language  deficits. Comprehension fair.   Psychiatric:  Generally pleasant and appropriate    Results for orders placed or performed during the hospital encounter of 02/14/17 (from the past 48 hour(s))  Basic metabolic panel     Status: Abnormal   Collection Time: 02/21/17  2:35 AM  Result Value Ref Range   Sodium 136 135 - 145 mmol/L   Potassium 3.2 (L) 3.5 - 5.1 mmol/L   Chloride 105 101 - 111 mmol/L   CO2 25 22 - 32 mmol/L   Glucose, Bld 108 (H) 65 - 99 mg/dL   BUN 11 6 - 20 mg/dL   Creatinine, Ser 0.66 0.44 - 1.00 mg/dL   Calcium 8.3 (L) 8.9 - 10.3 mg/dL   GFR calc non Af Amer >60 >60 mL/min   GFR calc Af Amer >60 >60 mL/min    Comment: (NOTE) The eGFR has been calculated using the CKD EPI equation. This calculation has not been validated in all clinical situations. eGFR's persistently <60 mL/min signify possible Chronic Kidney Disease.    Anion gap 6 5 - 15  CBC     Status: Abnormal   Collection Time: 02/21/17  2:35 AM  Result Value Ref Range   WBC 8.9 4.0 - 10.5 K/uL   RBC 3.53 (L) 3.87 - 5.11 MIL/uL   Hemoglobin 10.7 (L) 12.0 - 15.0 g/dL   HCT 32.7 (L) 36.0 - 46.0 %   MCV 92.6 78.0 - 100.0 fL   MCH 30.3 26.0 - 34.0 pg   MCHC 32.7 30.0 - 36.0 g/dL   RDW 13.1 11.5 - 15.5 %  Platelets 233 150 - 400 K/uL   No results found.     Medical Problem List and Plan: 1.  Right hemiparesis and language deficits secondary to left MCA infarct  -admit to inpatient rehab.  2.  DVT Prophylaxis/Anticoagulation: Pharmaceutical: Xarelto 3. Pain Management: tylenol prn 4. Mood: LCSW to follow for evaluation and support.  5. Neuropsych: This patient is not capable of making decisions on her own behalf. 6. Skin/Wound Care: Routine pressure relief measures.  7. Fluids/Electrolytes/Nutrition: Monitor I/O. Check lytes in am. Offer supplements between meals if intake poor.  8. A fib: Monitor HR bid. Continue amiodarone and metoprolol for HR control.   9. Aspiration PNA: Continue Levaquin Day #  4. Continue to encourage IS. Aspiration precautions.  10 Leukocytosis: Reactive to PNA and resolving with treatment. Recheck in am 11. Hypokalemia: Improving with supplement--2.8-->2.8-->3.2. . Will continue to supplement for now. Recheck in am.  12. ABLA: Will recheck CBC in am. Monitor for signs of bleeding.  13. Multiple Sclerosis: No medications? 14. H/o Depression: On Effexor and prozac.  62. H/o CAD s/p PTCA: On Lipitor  16. Lethargy: likely multifactorial due to altered sleep cycle,  Stroke, MS, pneumonia, sedative given last night  -check sleep chart  -limit neuro-sedating meds  -labs ordered for tomorrow  -trazodone prn tonight for insomnia   Post Admission Physician Evaluation: 1. Functional deficits secondary  to left MCA infarct. 2. Patient is admitted to receive collaborative, interdisciplinary care between the physiatrist, rehab nursing staff, and therapy team. 3. Patient's level of medical complexity and substantial therapy needs in context of that medical necessity cannot be provided at a lesser intensity of care such as a SNF. 4. Patient has experienced substantial functional loss from his/her baseline which was documented above under the "Functional History" and "Functional Status" headings.  Judging by the patient's diagnosis, physical exam, and functional history, the patient has potential for functional progress which will result in measurable gains while on inpatient rehab.  These gains will be of substantial and practical use upon discharge  in facilitating mobility and self-care at the household level. 5. Physiatrist will provide 24 hour management of medical needs as well as oversight of the therapy plan/treatment and provide guidance as appropriate regarding the interaction of the two. 6. The Preadmission Screening has been reviewed and patient status is unchanged unless otherwise stated above. 7. 24 hour rehab nursing will assist with bladder management, bowel  management, safety, skin/wound care, disease management, medication administration, pain management and patient education  and help integrate therapy concepts, techniques,education, etc. 8. PT will assess and treat for/with: Lower extremity strength, range of motion, stamina, balance, functional mobility, safety, adaptive techniques and equipment, NMR, family education, orthotics.   Goals are: supervision to min assist. 9. OT will assess and treat for/with: ADL's, functional mobility, safety, upper extremity strength, adaptive techniques and equipment, NMR, family education, orthotics.   Goals are: supervision to min assist . Therapy may proceed with showering this patient. 10. SLP will assess and treat for/with: cognition, communication, language, swallowing, family ed.  Goals are: supervision to min assist. 11. Case Management and Social Worker will assess and treat for psychological issues and discharge planning. 12. Team conference will be held weekly to assess progress toward goals and to determine barriers to discharge. 13. Patient will receive at least 3 hours of therapy per day at least 5 days per week. 14. ELOS: 18-22 days       15. Prognosis:  excellent  Meredith Staggers, MD, [facility] 02/22/2017  Bary Leriche, Hershal Coria 02/22/2017

## 2017-02-22 NOTE — Progress Notes (Signed)
Patient received approximately 1710 alert and oriented to self with husband at bedside. Patient and husband oriented to room and call bell system. Patient and husband verbalized understanding of admission process. Continue with plan of care.  Kaitlin [name]

## 2017-02-22 NOTE — Progress Notes (Signed)
Pt discharge education and instructions completed. Pt discharge to CIR and reported called to nurse Angie by RN Bard Herbert. Pt IV and external catheter removed. Pt transported off unit via bed with family and belongings to the side. Dionne Bucy RN

## 2017-02-22 NOTE — Progress Notes (Signed)
Physical Therapy Treatment Patient Details Name: Kaitlin [name] MRN: [id number] DOB: 1945-11-19 Today's Date: 02/22/2017    History of Present Illness Pt is a 71 y/o female with a PMH significant for multiple sclerosis. She presented to [facility] 7/17 with R-sided weakness and aphasia. CT revealed L MCA occlusion. She is now s/p R vertebral arteriogram with complete revascularization of occluded L MCA and stent assisted angioplasty of proximal L ICA occlusion. She was intubated 7/17-7/18.    PT Comments    Patient seen for gait retraining and activity progression. Tolerated well, continues to require increased physical assist and multimodal cues. Some cognitive improvements noted this session. Patient pleasant and alert throughout session. Current POC remains appropriate.   Follow Up Recommendations  CIR;Supervision/Assistance - 24 hour     Equipment Recommendations   (TBD by next venue of care)    Recommendations for Other Services Rehab consult     Precautions / Restrictions Precautions Precautions: Fall Precaution Comments: R side inattention Restrictions Weight Bearing Restrictions: No    Mobility  Bed Mobility Overal bed mobility: Needs Assistance Bed Mobility: Supine to Sit;Sit to Supine     Supine to sit: Mod assist Sit to supine: Mod assist   General bed mobility comments: Patient able to move LEs to EOb, assist for UE pull to sit, moderate assist to rotate trunk and elevate to upright. Assist to elevated LEs back to bed  Transfers Overall transfer level: Needs assistance Equipment used: 2 person hand held assist Transfers: Sit to/from Stand Sit to Stand: Mod assist;+2 safety/equipment         General transfer comment: VCs for hand placement. Manual faciliatation of push up throughout R UE,  moderate assist to power up and maintain stablity in standing. Performed several times during session  Ambulation/Gait Ambulation/Gait assistance: Mod assist;+2 physical  assistance;+2 safety/equipment Ambulation Distance (Feet): 30 Feet (3 trials) Assistive device: 2 person hand held assist Gait Pattern/deviations: Decreased stance time - right;Decreased step length - right;Decreased step length - left Gait velocity: Decreased Gait velocity interpretation: Below normal speed for age/gender General Gait Details: multimodal cues for facilitation of upright posture, weight shift, stride and pacing. Patient fatigues quickly without UE support.    Stairs            Wheelchair Mobility    Modified Rankin (Stroke Patients Only) Modified Rankin (Stroke Patients Only) Pre-Morbid Rankin Score: No symptoms Modified Rankin: Moderately severe disability     Balance Overall balance assessment: Needs assistance Sitting-balance support: Feet supported;No upper extremity supported Sitting balance-Leahy Scale: Fair Sitting balance - Comments: Able to maintain sitting balance with min guard to supervision for safety while brushing hair. Postural control: Posterior lean (in standing) Standing balance support: Bilateral upper extremity supported;During functional activity Standing balance-Leahy Scale: Poor Standing balance comment: Assist for stability in standing, Postural fatigue noted                            Cognition Arousal/Alertness: Awake/alert Behavior During Therapy: Flat affect Overall Cognitive Status: Impaired/Different from baseline Area of Impairment: Orientation;Attention;Memory;Following commands;Safety/judgement;Awareness;Problem solving                 Orientation Level: Disoriented to;Situation Current Attention Level: Selective Memory: Decreased short-term memory;Decreased recall of precautions Following Commands: Follows one step commands with increased time Safety/Judgement: Decreased awareness of safety;Decreased awareness of deficits Awareness: Emergent Problem Solving: Slow processing;Decreased  initiation;Difficulty sequencing;Requires verbal cues;Requires tactile cues General Comments: following commands throughout session more  consistently today; increased FOF throughout activity      Exercises      General Comments        Pertinent Vitals/Pain Faces Pain Scale: No hurt Pain Location: General grimacing with movement Pain Descriptors / Indicators: Discomfort    Home Living     Available Help at Discharge: Family;Available 24 hours/day (no children)                Prior Function            PT Goals (current goals can now be found in the care plan section) Acute Rehab PT Goals Patient Stated Goal: Family goal is to return to PLOF PT Goal Formulation: With family Time For Goal Achievement: 03/03/17 Potential to Achieve Goals: Good Progress towards PT goals: Progressing toward goals    Frequency    Min 3X/week      PT Plan Current plan remains appropriate    Co-evaluation              AM-PAC PT "6 Clicks" Daily Activity  Outcome Measure  Difficulty turning over in bed (including adjusting bedclothes, sheets and blankets)?: Total Difficulty moving from lying on back to sitting on the side of the bed? : Total Difficulty sitting down on and standing up from a chair with arms (e.g., wheelchair, bedside commode, etc,.)?: Total Help needed moving to and from a bed to chair (including a wheelchair)?: A Lot Help needed walking in hospital room?: A Lot Help needed climbing 3-5 steps with a railing? : Total 6 Click Score: 8    End of Session Equipment Utilized During Treatment: Gait belt Activity Tolerance: Patient tolerated treatment well Patient left: in bed;with call bell/phone within reach;with bed alarm set;with nursing/sitter in room;with family/visitor present Nurse Communication: Mobility status PT Visit Diagnosis: Unsteadiness on feet (R26.81);Hemiplegia and hemiparesis Hemiplegia - Right/Left: Right Hemiplegia - dominant/non-dominant:  Non-dominant Hemiplegia - caused by: Cerebral infarction     Time: 2956-2130 PT Time Calculation (min) (ACUTE ONLY): 18 min  Charges:  $Gait Training: 8-22 mins                    G Codes:       Charlotte Crumb, PT DPT  Board Certified Neurologic Specialist [phone]    Fabio Asa 02/22/2017, 1:57 PM

## 2017-02-22 NOTE — Discharge Summary (Addendum)
Stroke Discharge Summary  Patient ID: Kaitlin [name]   MRN: [id number]      DOB: [birth year]  Date of Admission: 02/14/2017 Date of Discharge: 02/22/2017  Attending Physician:  Garvin Fila, MD, Stroke MD Consultant(s):  Treatment Team:  Lbcardiology, Rounding, MD Patient's PCP:  Prince Solian, MD  Discharge Diagnoses: Principal Problem:   Stroke (cerebrum) ([place]) - Acute MCA infarct due to left CCA/ICA and MCA occlusion, s/p mechanical thrombectomy and left ICA stenting in the setting of newly diagnosed afib Active Problems:   Paroxysmal A-fib ([place])   Atrial fibrillation with RVR ([place])   HLD (hyperlipidemia)   Acute hypoxemic respiratory failure ([place])  Past Medical History:  Diagnosis Date  . CAD S/P percutaneous coronary angioplasty 2001  . CVA (cerebral vascular accident) ([place]) 01/2017  . HCVD (hypertensive cardiovascular disease) 01/2017   normal LVF with moderate LVH, grade 1 DD  . High cholesterol   . Multiple sclerosis ([place])   . PAF (paroxysmal atrial fibrillation) ([place]) 01/2017   Past Surgical History:  Procedure Laterality Date  . CORONARY ANGIOPLASTY WITH STENT PLACEMENT  11/1999   RCA DES-[facility]  . IR ANGIO INTRA EXTRACRAN SEL COM CAROTID INNOMINATE UNI R MOD SED  02/15/2017  . IR ANGIO VERTEBRAL SEL SUBCLAVIAN INNOMINATE UNI R MOD SED  02/15/2017  . IR INTRAVSC STENT CERV CAROTID W/O EMB-PROT MOD SED INC ANGIO  02/15/2017  . IR PERCUTANEOUS ART THROMBECTOMY/INFUSION INTRACRANIAL INC DIAG ANGIO  02/15/2017  . NECK SURGERY    . RADIOLOGY WITH ANESTHESIA N/A 02/14/2017   Procedure: RADIOLOGY WITH ANESTHESIA;  Surgeon: Radiologist, Medication, MD;  Location: [facility];  Service: Radiology;  Laterality: N/A;  . WRIST SURGERY      Medications to be continued on Rehab . amiodarone  200 mg Oral Daily  . atorvastatin  40 mg Oral q1800  . calcium-vitamin D  1 tablet Oral Daily  . chlorhexidine gluconate (MEDLINE KIT)  15 mL Mouth Rinse BID  . clopidogrel  75 mg Oral  Daily  . FLUoxetine  10 mg Oral Daily  . levofloxacin  750 mg Oral q1800  . metoprolol tartrate  50 mg Oral BID  . multivitamin  1 tablet Oral BID  . pantoprazole  40 mg Oral Daily  . rivaroxaban  20 mg Oral Q supper  . traZODone  50 mg Oral QHS  . venlafaxine XR  75 mg Oral Daily    LABORATORY STUDIES CBC    Component Value Date/Time   WBC 8.9 02/21/2017 0235   RBC 3.53 (L) 02/21/2017 0235   HGB 10.7 (L) 02/21/2017 0235   HCT 32.7 (L) 02/21/2017 0235   PLT 233 02/21/2017 0235   MCV 92.6 02/21/2017 0235   MCH 30.3 02/21/2017 0235   MCHC 32.7 02/21/2017 0235   RDW 13.1 02/21/2017 0235   LYMPHSABS 1.3 02/15/2017 0558   MONOABS 0.4 02/15/2017 0558   EOSABS 0.0 02/15/2017 0558   BASOSABS 0.0 02/15/2017 0558   CMP    Component Value Date/Time   NA 136 02/21/2017 0235   K 3.2 (L) 02/21/2017 0235   CL 105 02/21/2017 0235   CO2 25 02/21/2017 0235   GLUCOSE 108 (H) 02/21/2017 0235   BUN 11 02/21/2017 0235   CREATININE 0.66 02/21/2017 0235   CALCIUM 8.3 (L) 02/21/2017 0235   PROT 6.7 02/14/2017 2030   ALBUMIN 4.0 02/14/2017 2030   AST 25 02/14/2017 2030   ALT 20 02/14/2017 2030   ALKPHOS 58 02/14/2017 2030  BILITOT 0.4 02/14/2017 2030   GFRNONAA >60 02/21/2017 0235   GFRAA >60 02/21/2017 0235   COAGS Lab Results  Component Value Date   INR 0.94 02/14/2017   Lipid Panel    Component Value Date/Time   CHOL 173 02/15/2017 0420   TRIG 145 02/15/2017 0420   HDL 70 02/15/2017 0420   CHOLHDL 2.5 02/15/2017 0420   VLDL 29 02/15/2017 0420   LDLCALC 74 02/15/2017 0420   HgbA1C  Lab Results  Component Value Date   HGBA1C 5.2 02/15/2017   Urinalysis    Component Value Date/Time   COLORURINE COLORLESS (A) 02/14/2017 2124   APPEARANCEUR CLEAR 02/14/2017 2124   LABSPEC 1.003 (L) 02/14/2017 2124   PHURINE 6.0 02/14/2017 2124   GLUCOSEU NEGATIVE 02/14/2017 2124   HGBUR NEGATIVE 02/14/2017 2124   BILIRUBINUR NEGATIVE 02/14/2017 2124   KETONESUR NEGATIVE 02/14/2017  2124   PROTEINUR NEGATIVE 02/14/2017 2124   NITRITE NEGATIVE 02/14/2017 2124   LEUKOCYTESUR NEGATIVE 02/14/2017 2124   Urine Drug Screen     Component Value Date/Time   LABOPIA NONE DETECTED 02/14/2017 2124   COCAINSCRNUR NONE DETECTED 02/14/2017 2124   LABBENZ NONE DETECTED 02/14/2017 2124   AMPHETMU NONE DETECTED 02/14/2017 2124   THCU NONE DETECTED 02/14/2017 2124   LABBARB NONE DETECTED 02/14/2017 2124    Alcohol Level    Component Value Date/Time   ETH 152 (H) 02/14/2017 2030     SIGNIFICANT DIAGNOSTIC STUDIES Ct Angio Head/Neck W and Wo Contrast 02/14/2017 IMPRESSION: CT head: 1. Focal hypodensity in left insula and left posterior putamen compatible with infarction. 2. Hyperdensity and distal left M1. 3. ASPECTS is 8 CTA neck: 1. Occlusion of the left common and internal carotid artery from the arch to the distal cavernous segment. 2. Patent right common carotid system and vertebral arteries without dissection, aneurysm, or significant stenosis. 3. Moderate calcific atherosclerosis of the aortic arch 4. Centrilobular emphysema of lung apices. CTA head: 1. Patent paraclinoid and terminal segments of left internal carotid artery and minimal flow in proximal left M1 likely retrograde via a tiny left posterior communicating artery. 2. Occlusion of mid and distal left M1 with poor left MCA collateralization. 3. Left A1 occlusion. Bilateral A2 are patent with the left likely perfused via the anterior communicating artery. Symmetric distal ACA circulation. 4. Patent right MCA and posterior circulation. 5. No intracranial aneurysm identified.   Ct Head Wo Contrast 02/15/2017 IMPRESSION: 1. Contrast staining versus blood products LEFT basal ganglia to LEFT temporal lobe. Recommend 6-12 hour follow-up CT. 2. Moderate to severe chronic small vessel ischemic disease. T  Mr Brain 86 Contrast Mr Jodene Nam Head/brain Wo Cm 02/15/2017 IMPRESSION: 1. Acute confluent infarct involving the left  putamen, caudate body, and corona radiata. 2 tiny cortical infarcts in the left MCA distribution. 2. Extensive chronic white matter disease, some combination of patient's multiple sclerosis and chronic microvascular ischemia. 3. Continued good patency of recently re- cannulized left ICA and M1 segment. 4. Mild left M1 segment irregularity. Moderate left M2 inferior division stenosis. 5. Moderate to advanced stenosis of the left V4 segment.   Ct Cerebral Perfusion W Contrast 02/14/2017 IMPRESSION: 1. Left MCA distribution perfusion anomaly. Ischemic penumbra of 33 cc calculated by mismatch volume. 2. 0 cc infarct core (CBF <30%) by perfusion. This does not included the infarct core volume of the ASPECT 8 infarct on non contrast CT of the head involving the part of the left insula and putamen as visible infarct on noncontrast CT can be pseudo  normalized on perfusion.   Portable Chest Xray 02/15/2017 IMPRESSION: 1. ACDF of the lower cervical spine. 2. Satisfactory support line and tube positions. 3. Atelectasis and/or scarring at the left lung base. 4. Aortic atherosclerosis.  Ct Head Code Stroke W/o Cm 02/14/2017 IMPRESSION: CT head: 1. Focal hypodensity in left insula and left posterior putamen compatible with infarction. 2. Hyperdensity and distal left M1. 3. ASPECTS is 8 CTA neck: 1. Occlusion of the left common and internal carotid artery from the arch to the distal cavernous segment. 2. Patent right common carotid system and vertebral arteries without dissection, aneurysm, or significant stenosis. 3. Moderate calcific atherosclerosis of the aortic arch 4. Centrilobular emphysema of lung apices. CTA head: 1. Patent paraclinoid and terminal segments of left internal carotid artery and minimal flow in proximal left M1 likely retrograde via a tiny left posterior communicating artery. 2. Occlusion of mid and distal left M1 with poor left MCA collateralization. 3. Left A1 occlusion. Bilateral A2 are patent  with the left likely perfused via the anterior communicating artery. Symmetric distal ACA circulation. 4. Patent right MCA and posterior circulation. 5. No intracranial aneurysm identified.    TTE - Left ventricle: The cavity size was normal. There was moderate concentric hypertrophy. Systolic function was vigorous. Theestimated ejection fraction was in the range of 65% to 70%. Wallmotion was normal; there were no regional wall motionabnormalities. Doppler parameters are consistent with abnormalleft ventricular relaxation (grade 1 diastolic dysfunction).Doppler parameters are consistent with elevated ventricular end-diastolic filling pressure. - Aortic valve: Trileaflet; mildly thickened, mildly calcified leaflets. There was mild stenosis. Mean gradient (S): 11 mm Hg.  Peak gradient (S): 17 mm Hg. Valve area (VTI): 1.45 cm^2. Valvearea (Vmax): 1.73 cm^2. Valve area (Vmean): 1.44 cm^2. - Mitral valve: Calcified annulus. Mildly thickened leaflets.There was trivial regurgitation. - Left atrium: The atrium was normal in size. - Right ventricle: The cavity size was normal. Wall thickness wasnormal. Systolic function was normal. - Right atrium: The atrium was normal in size. - Tricuspid valve: There was trivial regurgitation. - Pulmonary arteries: Systolic pressure was within the normal range. - Inferior vena cava: The vessel was normal in size. - Pericardium, extracardiac: There was no pericardial effusion. Impressions: - No cardiac source of emboli was indentified.  LE venous doppler There is no evidence of deep or superficial vein thrombosis involving the right and left lower extremities. All visualized vessels appear patent and compressible.  Incidental findings are consistent with a Baker's Cyst measuring 1.2 cm high by 3.5 cm wide by 3.1 cm long on the right.  Dg Chest Port 1 View 02/18/2017 IMPRESSION: Right much worse than left airspace disease has an appearance most worrisome for  pneumonia. Associated small to moderate right pleural effusion is noted.     HISTORY OF PRESENT ILLNESS Kaitlin [name] is an 71 y.o. female with a 20 year history of MS who presented as a Code Stroke for right sided weakness and garbled speech. Time of symptom onset was 11 AM while working in the yard, at which point she began to feel very hot and could not speak. She took a shower and symptoms seemed almost completely to resolve per husband. Subsequently, she had a nap and then at about 1700 she and her husband had a few glasses of wine, at which point he noticed that she was having some difficulty using her right side and it "was hard for her to get up", the latter symptom noted at about 1900.  CT head showed focal hypodensity  in left insula and left posterior putamen compatible with infarction with ASPECTS of 8. Also noted was hyperdensity of the distal left M1.  CTA neck showed the following: 1. Occlusion of the left common and internal carotid artery from the arch to the distal cavernous segment. 2. Patent right common carotid system and vertebral arteries without dissection, aneurysm, or significant stenosis. 3. Moderate calcific atherosclerosis of the aortic arch  CTA head showed the following: 1. Patent paraclinoid and terminal segments of left internal carotid artery and minimal flow in proximal left M1 likely retrograde via a tiny left posterior communicating artery. 2. Occlusion of mid and distal left M1 with poor left MCA collateralization. 3. Left A1 occlusion. Bilateral A2 are patent with the left likely perfused via the anterior communicating artery. Symmetric distal ACA circulation. 4. Patent right MCA and posterior circulation. 5. No intracranial aneurysm identified.  LSN: 11 AM tPA Given: No: Out of time window  Of note, she has not had an MS exacerbation in several years and was recently taken off of Betaseron. Patient was admitted to the intensive care unit where blood  pressure was tightly controlled. She was extubated and did well. She was seen by physical speech and occupational therapy. She was started on initially aspirin and Brilinta by Dr. Estanislado Pandy for carotid stent but subsequently switched to Plavix and eliquis when anticoagulation was started 5 days after her stroke and it was felt to be safe. She developed mild rash on both cheeks which was felt to be related to eliquis hence it was discontinued and she was started on Xarelto instead .She had some mild hematuria but hematocrit remained stable. She had mild expressive language difficulties and mild residual right-sided hemiparesis at the time of discharge. She was transferred to inpatient rehabilitation.  HOSPITAL COURSE Kaitlin [name] is a 71 y.o. female with a  36 year history of MS, hyperlipidemia, and CAD with stent in 2000, who presented as a Code Stroke for right sided weakness and garbled speech. She did not receive IV t-PA due to arriving outside of the treatment window.   Stroke:  Acute MCA infarct due to left CCA/ICA and MCA occlusion, s/p mechanical thrombectomy and left ICA stenting in the setting of newly diagnosed afib  Resultant  mild aphasia, right hemiplegia  CT head: Contrast staining versus blood products LEFT basal ganglia to LEFT temporal lobe.   CTA head and neck: left CCA origin occlusion, A1 and distal M1 occlusion  MRI head: Acute confluent infarct involving the left putamen, caudate body, and corona radiata, as well as tiny cortical infarcts in the left MCA distribution.    MRA head: Mild left M1 segment irregularity, moderate left M2 inferior division stenosis.   2D Echo  EF 65-70%  LE venous doppler no clot but baker's cyst  LDL 74  HgbA1c 5.2  lovenox for VTE prophylaxis  DIET DYS 2 Room service appropriate? Yes; Fluid consistency: Thin  aspirin 81 mg daily prior to admission, now on aspirin 81 and brilinta 90 mg bid due to high P2Y12 level. Cardiology  recommend eliquis with plavix once pt stable for anticoagulation. I discussed with Dr. Estanislado Pandy and he is in agreement.   Patient counseled to be compliant with her antithrombotic medications  Ongoing aggressive stroke risk factor management  Therapy recommendations:  pending  Disposition:  Pending  PAF with RVR  new diagnosis likely the cause for stroke  Off amiodarone drip and still on esmolol drip  Cardiology on board  Put  on metoprolol for rate control  Xarelto for anticoagulation and Plavix for stent  Left CCA/ICA occlusion s/p thrombectomy  Likely due to undiagnosed afib PTA  S/p left ICA stent  Respiratory distress  Likely due to aspiration pneumonia  CXR concerning for pneumonia  CCM on board  Put on Levaquin  On high flow oxygen mask  Continue ICU monitoring  Hypertension  Stable  Permissive hypertension (OK if <180/105) and gradually normalized within 5-7 days.  Long-term blood pressure goal normotensive  Hypotension on admission and postprocedure resolved  Hyperlipidemia  Home meds: none  LDL 74, goal < 70  Add atorvastatin 23m PO daily  Continue statin at discharge  Dysphagia   dys 2 diet with thin liquid  Speech is following  Other Stroke Risk Factors  Advanced age  ETOH use, advised to drink no more than 1 drink(s) a day  Coronary artery disease  Other Active Problems  Sinus bradycardia - resolved  [place] Betaser[place], no flare up, took off 11/2015 due to elevated LFT  Right Baker's cyst  DISCHARGE EXAM Blood pressure (!) 128/39, pulse (!) 58, temperature 98.9 F (37.2 C), temperature source Oral, resp. rate 16, height 5' 2"  (1.575 m), weight 59.6 kg (131 lb 6.3 oz), SpO2 98 %.  General - Well nourished, well developed, elderly Caucasian lady Ophthalmologic - Fundi not visualized due to noncooperation.  Cardiovascular - Regular rate and  rhythm.  Neuro - awake alert and interactive Eyes open, follows simple commands centrally and peripherally.mild dysarthria and expressive aphasia eye movements full range. Blinking to visual threat bilaterally. PERRL. Facial symmetry not able to assess due to ET tube. RUE 1/5, RLE 3/5. LUE at least 4/5, LLE 4/5. DTR 1+ and right positive babinski. Sensation, coordination and gait not tested.  Discharge Diet  DIET DYS 2 Room service appropriate? Yes; Fluid consistency: Thin liquids  DISCHARGE PLAN  [place]  Xarelto (rivaroxaban) daily for secondary stroke prevention.  Recommend ongoing risk factor control by Primary Care Physician at time of discharge from inpatient rehabilitation.  Follow-up Avva, Ravisankar, MD in 2 weeks following discharge from rehab.  [facility] in 6 weeks, office to schedule an appointment.   45 minutes were spent preparing discharge.  I have personally examined this patient, reviewed notes, independently viewed imaging studies, participated in medical decision making and plan of care.ROS completed by me personally and pertinent positives fully documented  I have made any additions or clarifications directly to the above note. Agree with note above.   [facility] MD Medical Director [facility] Pager: [phone]/24/2018 4:38 PM

## 2017-02-22 NOTE — Progress Notes (Signed)
Kaitlin Gong, RN Rehab Admission Coordinator Signed Physical Medicine and Rehabilitation  PMR Pre-admission Date of Service: 02/22/2017 11:14 AM  Related encounter: ED to Hosp-Admission (Current) from 02/14/2017 in [place]       _0 Hide copied text PMR Admission Coordinator Pre-Admission Assessment  Patient: Kaitlin [name] is an 71 y.o., female MRN: [id number] DOB: April 13, 1946 Height: _1  (157.5 cm) Weight: 59.6 kg (131 lb 6.3 oz)                                                                                                                                                  Insurance Information HMO: yes    PPO:      PCP:      IPA:      80/20:      OTHER: medicare advantage plan PRIMARY: united health care Medicare      Policy#: [id number]      Subscriber: pt CM Name: Fulton Mole      Phone#: [phone]     Fax#: [phone] Pre-Cert#: [id number] approved for 7 days. F/u CM Vevelyn Royals phone [phone] fax [phone]      Employer: retired Benefits:  Phone #: [phone]     Name: 02/22/2017 Eff. Date: 09/30/2008     Deduct: none      Out of Pocket Max: $4400      Life Max: none CIR: $345 co pay per day days 1-5      SNF: no co pay days 1-20: $160 co pay per day days 21-48; no co pay days 49-100 Outpatient: $40 co pay per visit     Co-Pay: visits per medical neccesity Home Health: 100%      Co-Pay: visits per medical necessity DME: 80%     Co-Pay: 20% Providers: in network  SECONDARY: none      \ Medicaid Application Date:       Case Manager:  Disability Application Date:       Case Worker:   Emergency Contact Information        Contact Information    Name Relation Home Work Mobile   Tax,[name] Spouse [phone]       Current Medical History  Patient Admitting Diagnosis: left MCA infarct, history of MS  History of Present Illness: HPI: Kaitlin [name] a 71 y.o.right handed femalewith a 20 year history of  multiple sclerosis, CAD with stenting. Presented 02/14/2017 with right-sided weakness and garbled speech. CT showed focal hypodensity and left insula and left posterior putamen. Patient did not receive TPA. CT angiogram head and neck showed occlusion of the left common and internal carotid artery from the arch to the distal cavernous segment. CT cerebral perfusion scan showed left MCA distribution infarct. EKG showed atrial fibrillation. Echocardiogram with ejection fraction of 01% grade 1 diastolic dysfunction. Underwent mechanical thrombectomy left ICA  stenting per interventional radiology.Marland Kitchen MRI 02/15/2017 showed acute confluent infarct left putamen, caudate head and corona radiata as well as tiny cortical infarcts in the left MCA distribution. Patient did require intubation for a short time. Bilateral lower extremity Dopplers negative. Cardiology follow-up for atrial fibrillation initially placed on amiodarone with rate control. Patient presently on aspirin as well as Brilintafor CVA prophylaxis in light of atrial fibrillation. Dysphagia #2 thin liquid diet.   Pt had transient hematuria 02/20/2017 which appears to be stable per neurology. Hematocrit stable.Now on aspirin and brilinta due to high P2Y12 level. Cardiology recommend eliquis with plavix once pt stable for anticoagulation. Dr. Estanislado Pandy in agreement Eliquis for anticoagulation and Plavix for Stent.  Left CCA/ICA occlusion s/p thrombectomy and likely due to undiagnosed afib pta.  Total: 10 NIHSS  Past Medical History      Past Medical History:  Diagnosis Date  . CAD S/P percutaneous coronary angioplasty 2001  . CVA (cerebral vascular accident) ([place]) 01/2017  . HCVD (hypertensive cardiovascular disease) 01/2017   normal LVF with moderate LVH, grade 1 DD  . High cholesterol   . Multiple sclerosis ([place])   . PAF (paroxysmal atrial fibrillation) ([place]) 01/2017    Family History  family history is not on file.  Prior  Rehab/Hospitalizations:  Has the patient had major surgery during 100 days prior to admission? No  Current Medications   Current Facility-Administered Medications:  .  0.9 %  sodium chloride infusion, , Intravenous, Continuous, Rosalin Hawking, MD, Last Rate: 50 mL/hr at 02/21/17 2102 .  acetaminophen (TYLENOL) tablet 650 mg, 650 mg, Oral, Q4H PRN **OR** [DISCONTINUED] acetaminophen (TYLENOL) solution 650 mg, 650 mg, Per Tube, Q4H PRN, 650 mg at 02/15/17 2048 **OR** [DISCONTINUED] acetaminophen (TYLENOL) suppository 650 mg, 650 mg, Rectal, Q4H PRN, Deveshwar, Sanjeev, MD .  amiodarone (PACERONE) tablet 200 mg, 200 mg, Oral, Daily, Nahser, Wonda Cheng, MD, 200 mg at 02/22/17 0927 .  atorvastatin (LIPITOR) tablet 40 mg, 40 mg, Oral, q1800, Kerney Elbe, MD, 40 mg at 02/21/17 1700 .  calcium-vitamin D (OSCAL WITH D) 500-200 MG-UNIT per tablet 1 tablet, 1 tablet, Oral, Daily, Honor Loh, [place], 1 tablet at 02/22/17 8366 .  chlorhexidine gluconate (MEDLINE KIT) (PERIDEX) 0.12 % solution 15 mL, 15 mL, Mouth Rinse, BID, Kerney Elbe, MD, 15 mL at 02/22/17 0815 .  clopidogrel (PLAVIX) tablet 75 mg, 75 mg, Oral, Daily, Rinehuls, David L, PA-C, 75 mg at 02/22/17 0926 .  FLUoxetine (PROZAC) capsule 10 mg, 10 mg, Oral, Daily, Kerney Elbe, MD, 10 mg at 02/22/17 0926 .  gabapentin (NEURONTIN) capsule 300 mg, 300 mg, Oral, PRN, Kerney Elbe, MD .  levofloxacin (LEVAQUIN) tablet 750 mg, 750 mg, Oral, q1800, Antony Contras S, MD .  metoprolol tartrate (LOPRESSOR) tablet 50 mg, 50 mg, Oral, BID, Dorothy Spark, MD, 50 mg at 02/22/17 0926 .  multivitamin (PROSIGHT) tablet 1 tablet, 1 tablet, Oral, BID, Kerney Elbe, MD, 1 tablet at 02/22/17 0925 .  pantoprazole (PROTONIX) EC tablet 40 mg, 40 mg, Oral, Daily, Alvira Philips, [place], 40 mg at 02/22/17 2947 .  rivaroxaban (XARELTO) tablet 20 mg, 20 mg, Oral, Q supper, Wynell Balloon, RPH, 20 mg at 02/21/17 1659 .  traZODone (DESYREL) tablet 50 mg, 50 mg,  Oral, QHS, Patteson, Samuel A, NP, 50 mg at 02/21/17 2149 .  venlafaxine XR (EFFEXOR-XR) 24 hr capsule 75 mg, 75 mg, Oral, Daily, Kerney Elbe, MD, 75 mg at 02/22/17 6546  Patients Current Diet: DIET DYS 2 Room service  appropriate? Yes; Fluid consistency: Thin meds crushed with puree  Precautions / Restrictions Precautions Precautions: Fall Precaution Comments: R side inattention Restrictions Weight Bearing Restrictions: No   Has the patient had 2 or more falls or a fall with injury in the past year?No  Prior Activity Level Community (5-7x/wk): pt independent , driving without AD pta. loved to garden. 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 None of the above  Prior Functional Level Prior Function Level of Independence: Independent Comments: Pt gardening the morning she was admitted  Self Care: Did the patient need help bathing, dressing, using the toilet or eating?  Independent  Indoor Mobility: Did the patient need assistance with walking from room to room (with or without device)? Independent  Stairs: Did the patient need assistance with internal or external stairs (with or without device)? Independent  Functional Cognition: Did the patient need help planning regular tasks such as shopping or remembering to take medications? Independent  Current Functional Level Cognition  Arousal/Alertness: Lethargic Overall Cognitive Status: Impaired/Different from baseline Current Attention Level: Selective Orientation Level: Oriented to person, Disoriented to situation, Disoriented to time, Disoriented to place Following Commands: Follows one step commands with increased time Safety/Judgement: Decreased awareness of safety, Decreased awareness of  deficits General Comments: Pt able to follow one-step commands during session and requiring increased time and multimodal cues as session progressed likely due to fatigue.  Attention: Sustained Sustained Attention: Impaired Sustained Attention Impairment: Verbal basic Memory:  (TBA) Awareness: Impaired Awareness Impairment: Anticipatory impairment, Emergent impairment Problem Solving: Impaired Problem Solving Impairment: Functional basic Executive Function: Self Monitoring, Self Correcting Safety/Judgment: Impaired    Extremity Assessment (includes Sensation/Coordination)  Upper Extremity Assessment: RUE deficits/detail RUE Deficits / Details: Noted inattention to R UE at times. Noted 2-/5 strength at elbow this session and slight movement in digits.   Lower Extremity Assessment: Defer to PT evaluation RLE Deficits / Details: Significant RLE weakness noted. Appeared that pt was attempting to move it but was not able to. Not able to participate in MMT or sensation testing.     ADLs  Overall ADL's : Needs assistance/impaired Eating/Feeding: Moderate assistance, Sitting Grooming: Minimal assistance, Sitting, Cueing for safety, Cueing for sequencing, Moderate assistance Grooming Details (indicate cue type and reason): Min assist for single handed tasks after set-up; mod assist for bimanual tasks Upper Body Bathing: Moderate assistance, Sitting Lower Body Bathing: Maximal assistance, Sit to/from stand Upper Body Dressing : Moderate assistance, Sitting Lower Body Dressing: Maximal assistance, Sit to/from stand Toilet Transfer: Moderate assistance, +2 for physical assistance, +2 for safety/equipment, Stand-pivot Toileting- Clothing Manipulation and Hygiene: Maximal assistance, Sit to/from stand Functional mobility during ADLs: Moderate assistance, +2 for physical assistance General ADL Comments: Pt able to sit at EOB to brush her hair with VC's for sequence.     Mobility  Overal bed  mobility: Needs Assistance Bed Mobility: Supine to Sit Supine to sit: Mod assist, +2 for safety/equipment General bed mobility comments: Pt able to initiate B LE off of bed and requiring mod assist to pull trunk into upright position.     Transfers  Overall transfer level: Needs assistance Equipment used: 2 person hand held assist Transfers: Sit to/from Stand Sit to Stand: Mod assist, +2 safety/equipment Stand pivot transfers: Mod assist, +2 physical assistance, +2 safety/equipment Squat  pivot transfers: Mod assist, +2 physical assistance, +2 safety/equipment General transfer comment: VC's for hand placement on seated surface for safety. Pt initiated rise to stand without assist, however required therapist assist for completion of power-up to full standing position. Once standing, pt immediately initiated ambulation. Required multimodal cues to slow pt and reposition feet before initiation of ambulation.     Ambulation / Gait / Stairs / Wheelchair Mobility  Ambulation/Gait Ambulation/Gait assistance: Mod assist, +2 physical assistance, +2 safety/equipment Ambulation Distance (Feet): 75 Feet Assistive device: 2 person hand held assist Gait Pattern/deviations: Decreased stance time - right, Decreased step length - right, Decreased step length - left General Gait Details: Multimodal cues for upright posture and stability in R stance; assist to advance R foot in swing; noted improving ability to organize center of mass over feet Gait velocity: Decreased Gait velocity interpretation: Below normal speed for age/gender    Posture / Balance Dynamic Sitting Balance Sitting balance - Comments: Able to maintain sitting balance with min guard to supervision for safety while brushing hair. Balance Overall balance assessment: Needs assistance Sitting-balance support: Feet supported, No upper extremity supported Sitting balance-Leahy Scale: Fair Sitting balance - Comments: Able to maintain  sitting balance with min guard to supervision for safety while brushing hair. Postural control: Posterior lean (in standing) Standing balance support: Bilateral upper extremity supported, During functional activity Standing balance-Leahy Scale: Poor Standing balance comment: +2 assist required     Special needs/care consideration BiPAP/CPAP  N/a CPM  N/a Continuous Drip IV  N/a Dialysis n/a Life Vest  N/a Oxygen  N/a Special Bed  N/a Trach Size  N/a Wound Vac n/a Skin ecchymosis BUE; r side of upper and lower lip noted with small amount blood 02/23/17 Bowel mgmt:  No LBM documented Bladder mgmt: external catheter Diabetic mgmt Hgb A1c 5.2 New diagnosis PAF this admission Decreased safety awareness/has bedside sitter on acute   Previous Home Environment Living Arrangements: Spouse/significant other  Lives With: Spouse Available Help at Discharge: Family, Available 24 hours/day (no children) Type of Home: House Home Layout: Multi-level Alternate Level Stairs-Number of Steps: 1 Home Access: Stairs to enter Entrance Stairs-Rails: Right Entrance Stairs-Number of Steps: 4 Bathroom Shower/Tub: Tub only, Multimedia programmer: Standard Bathroom Accessibility: Yes How Accessible: Accessible via walker Home Care Services: No  Discharge Living Setting Plans for Discharge Living Setting: Patient's home, Lives with (comment) (spouse) Type of Home at Discharge: House Discharge Home Layout: Multi-level Discharge Home Access: Stairs to enter Entrance Stairs-Rails: Right Entrance Stairs-Number of Steps: 4 Discharge Bathroom Shower/Tub: Tub only, Walk-in shower Discharge Bathroom Toilet: Standard Discharge Bathroom Accessibility: Yes How Accessible: Accessible via walker Does the patient have any problems obtaining your medications?: No  Social/Family/Support Systems Patient Roles: Spouse Contact Information: [name], spouse Anticipated Caregiver: spouse Anticipated  Ambulance person Information: see above Ability/Limitations of Caregiver: none Caregiver Availability: 24/7 Discharge Plan Discussed with Primary Caregiver: Yes Is Caregiver In Agreement with Plan?: Yes Does Caregiver/Family have Issues with Lodging/Transportation while Pt is in Rehab?: No  Goals/Additional Needs Patient/Family Goal for Rehab: supervision to min assist with PT, OT, and SLP Expected length of stay: ELOS 18-21 days Dietary Needs: D2 with thin liquids; pills crushed in puree Pt/Family Agrees to Admission and willing to participate: Yes  Barriers to Discharge: Incontinence, Behavior (using bedside sitter on acute hospital due to decreased safe)  Barriers to Discharge Comments: recommended injury prevention bundle and telesitter  Decrease burden of Care through IP rehab admission: n/a  Possible need for  SNF placement upon discharge: not anticipated  Patient Condition: This patient's medical and functional status has changed since the consult dated: 7/22/20918 in which the Rehabilitation Physician determined and documented that the patient's condition is appropriate for intensive rehabilitative care in an inpatient rehabilitation facility. See "History of Present Illness" (above) for medical update. Functional changes are: mod assist. Patient's medical and functional status update has been discussed with the Rehabilitation physician and patient remains appropriate for inpatient rehabilitation. Will admit to inpatient rehab today.  Preadmission Screen Completed By:  Cleatrice Burke, 02/22/2017 11:29 AM ______________________________________________________________________   Discussed status with Dr. Naaman Plummer on 02/22/2017 at  1128 and received telephone approval for admission today.  Admission Coordinator:  Cleatrice Burke, time 7409 Date 02/22/2017.       Cosigned by: Meredith Staggers, MD at 02/22/2017 12:52 PM  Revision History

## 2017-02-22 NOTE — Discharge Instructions (Signed)

## 2017-02-22 NOTE — Progress Notes (Signed)
Progress Note  Patient Name: Kaitlin [name] Date of Encounter: 02/22/2017  Primary Cardiologist: Liane Comber, MD (prev followed @ [phone] but wishes to f/u locally)  Subjective   No chest pain, sob, or palpitations.  Feels that strength is improving.  Husband @ bedside.  Maintaining sinus rhythm.  Inpatient Medications    Scheduled Meds: . amiodarone  200 mg Oral Daily  . atorvastatin  40 mg Oral q1800  . calcium-vitamin D  1 tablet Oral Daily  . chlorhexidine gluconate (MEDLINE KIT)  15 mL Mouth Rinse BID  . clopidogrel  75 mg Oral Daily  . FLUoxetine  10 mg Oral Daily  . levofloxacin  750 mg Oral q1800  . metoprolol tartrate  50 mg Oral BID  . multivitamin  1 tablet Oral BID  . pantoprazole  40 mg Oral Daily  . rivaroxaban  20 mg Oral Q supper  . traZODone  50 mg Oral QHS  . venlafaxine XR  75 mg Oral Daily   Continuous Infusions:  PRN Meds: acetaminophen **OR** [DISCONTINUED] acetaminophen (TYLENOL) oral liquid 160 mg/5 mL **OR** [DISCONTINUED] acetaminophen, gabapentin   Vital Signs    Vitals:   02/21/17 2108 02/22/17 0130 02/22/17 0601 02/22/17 0952  BP: (!) 176/65 (!) 171/61 (!) 176/67 (!) 138/46  Pulse: 71 73 66 69  Resp: 20 20 20 20   Temp: 98.8 F (37.1 C) 99.3 F (37.4 C) 98.9 F (37.2 C) 98.1 F (36.7 C)  TempSrc: Oral Oral Oral Oral  SpO2: 95% 96% 98% 100%  Weight:      Height:        Intake/Output Summary (Last 24 hours) at 02/22/17 1231 Last data filed at 02/22/17 0830  Gross per 24 hour  Intake              540 ml  Output             3400 ml  Net            -2860 ml   Filed Weights   02/14/17 2000 02/14/17 2100 02/16/17 2230  Weight: 130 lb 4.7 oz (59.1 kg) 124 lb (56.2 kg) 131 lb 6.3 oz (59.6 kg)    Physical Exam   GEN: Well nourished, well developed, in no acute distress.  HEENT: R facial droop Neck: Supple, no JVD, carotid bruits, or masses. Cardiac: RRR, 2/6 SEM @ USB, no rubs, or gallops. No clubbing, cyanosis, edema.  Radials/DP/PT  2+ and equal bilaterally.  Respiratory:  Respirations regular and unlabored, clear to auscultation bilaterally. GI: Soft, nontender, nondistended, BS + x 4. MS: no deformity or atrophy. Skin: warm and dry, no rash. Neuro:  Right sided wkns. Psych: AAOx3.  Normal affect.  Labs    Chemistry Recent Labs Lab 02/19/17 0228 02/20/17 0443 02/21/17 0235  NA 134* 135 136  K 2.8* 2.8* 3.2*  CL 104 104 105  CO2 21* 22 25  GLUCOSE 111* 97 108*  BUN 14 12 11   CREATININE 0.48 0.62 0.66  CALCIUM 8.1* 7.9* 8.3*  GFRNONAA >60 >60 >60  GFRAA >60 >60 >60  ANIONGAP 9 9 6      Hematology Recent Labs Lab 02/19/17 0228 02/20/17 0443 02/21/17 0235  WBC 12.0* 7.5 8.9  RBC 3.30* 3.14* 3.53*  HGB 10.3* 9.8* 10.7*  HCT 31.0* 29.4* 32.7*  MCV 93.9 93.6 92.6  MCH 31.2 31.2 30.3  MCHC 33.2 33.3 32.7  RDW 13.1 13.4 13.1  PLT 191 195 233     Radiology  No results found.  Telemetry    RSR, PAC's - Personally Reviewed  Cardiac Studies   Echo 02/16/17- Study Conclusions   - Left ventricle: The cavity size was normal. There was moderate   concentric hypertrophy. Systolic function was vigorous. The   estimated ejection fraction was in the range of 65% to 70%. Wall   motion was normal; there were no regional wall motion   abnormalities. Doppler parameters are consistent with abnormal   left ventricular relaxation (grade 1 diastolic dysfunction).   Doppler parameters are consistent with elevated ventricular   end-diastolic filling pressure. - Aortic valve: Trileaflet; mildly thickened, mildly calcified   leaflets. There was mild stenosis. Mean gradient (S): 11 mm Hg.   Peak gradient (S): 17 mm Hg. Valve area (VTI): 1.45 cm^2. Valve   area (Vmax): 1.73 cm^2. Valve area (Vmean): 1.44 cm^2. - Mitral valve: Calcified annulus. Mildly thickened leaflets .   There was trivial regurgitation. - Left atrium: The atrium was normal in size. - Right ventricle: The cavity size was normal. Wall  thickness was   normal. Systolic function was normal. - Right atrium: The atrium was normal in size. - Tricuspid valve: There was trivial regurgitation. - Pulmonary arteries: Systolic pressure was within the normal   range. - Inferior vena cava: The vessel was normal in size. - Pericardium, extracardiac: There was no pericardial effusion.   Impressions:   - No cardiac source of emboli was indentified.     Patient Profile     71 y.o. female with a PMH significant for CAD, s/p remote RCA PCI 2001, HTN, HLD, and stable multiple sclerosis. She presented to [facility] 02/15/17 with a L MCA occlusion. She is now s/p R vertebral arteriogram with complete revascularization of occluded L MCA and stent assisted angioplasty of proximal L ICA occlusion. While on telemetry she had documented PAF and anticoagulation was initiated.   Assessment & Plan    1.  Acute LMCA infarct:  S/p LMCA PTA and LICA PTA/stenting on 7/17.  She remains on plavix and xarelto (in the setting of afib).  Plan for inpt rehab.  Strength improving slowly.  2.  PAF:  New dx this admission.  Started on Eliquis initially but developed rash and this was changed to xarelto on 7/23.  CHA2DS2VASc = 5.  Maintaining sinus on amio 200 daily and  blocker.  We will follow @ a distance once she is in rehab.  Call if necessary.  OTW plan to f/u in our office within 2 wks of discharge.  3.  CAD:  S/p PCI of the RCA in 2001. Non-ischemic st echo in 2016.  No c/p or sob.  Cont  blocker and statin.  No ASA in setting of plavix/eliquis.  4.  HL:  On pravachol.  LDL 74.  5.  Multiple Sclerosis:  Stable.  6.  Hypokalemia:  F/u and supp if necessary.  Signed, Murray Hodgkins, NP  02/22/2017, 12:31 PM     Patient seen and examined. Agree with assessment and plan. No recurrent AF. Maintaining sinus rhythm. Now on plavix/xarelto. EF 65-70%; mild AS.  K 3.2 yesterday; replete to 4.0; Mg 2.1. To be transferred to rehab today.   Troy Sine,  MD, [facility] 02/22/2017 3:19 PM

## 2017-02-22 NOTE — H&P (Signed)
Physical Medicine and Rehabilitation Admission H&P       Chief Complaint  Patient presents with  . Right sided weakness and difficulty speaking.     HPI:  Kaitlin [name] a 71 y.o.female with history of CAD s/p PTCA, MS with gait disorder/memory loss, PAF who was admitted on 02/14/17 with reports of speech difficulty progressing to right sided weakness later that day. CTA  head showed evidence of infarct left insular and left posterior putamen with  occlusion of L-CCA and L-ICA from arch to distal cavernous segment and incidental finding of centrilobar emphysema in lung apices. She underwent cerebral angio with complete revascularization of occluded L-MCA with one pass Solitaire and stent assisted angioplasty of proximal L-ICA with IA integrelin.  Follow up MRI brain done revealing acute confluent infarct involving the left putamen, caudate body, and corona radiata,  2 tiny cortical infarcts in the left MCA distribution and extensive white matter disease due to MS and chronic microvascular ischemia.  She tolerated extubation on 7/18 but was found to develop A fib with RVR requiring amiodarone and esmolol. Dr. Meda Coffee recommended Eliquis bid for A fib (as likely cause of stroke)  and changing Brilinta to Plavix.   BLE dopplers negative for DVT. 2 D echo revealed EF 65-70/5 with grade 1 diastolic dysfunction, mild aortic stenosis, mildly thickened MV and no wall abnormality.  She was started on dysphagia 2, thin liquids due to mild oropharyngeal dysphagia with stasis.  She developed respiratory distress requiring NRB on 7/20 and CXR revealed airspace disease RUL/RLL felt to be due to aspiration. She was started on Levaquin for treatment and    Review of Systems  Unable to perform ROS: Mental acuity          Past Medical History:  Diagnosis Date  . CAD S/P percutaneous coronary angioplasty 2001  . CVA (cerebral vascular accident) ([place]) 01/2017  . HCVD (hypertensive cardiovascular  disease) 01/2017   normal LVF with moderate LVH, grade 1 DD  . High cholesterol   . Multiple sclerosis ([place])   . PAF (paroxysmal atrial fibrillation) ([place]) 01/2017         Past Surgical History:  Procedure Laterality Date  . CORONARY ANGIOPLASTY WITH STENT PLACEMENT  11/1999   RCA DES-[facility]  . IR ANGIO INTRA EXTRACRAN SEL COM CAROTID INNOMINATE UNI R MOD SED  02/15/2017  . IR ANGIO VERTEBRAL SEL SUBCLAVIAN INNOMINATE UNI R MOD SED  02/15/2017  . IR INTRAVSC STENT CERV CAROTID W/O EMB-PROT MOD SED INC ANGIO  02/15/2017  . IR PERCUTANEOUS ART THROMBECTOMY/INFUSION INTRACRANIAL INC DIAG ANGIO  02/15/2017  . NECK SURGERY    . RADIOLOGY WITH ANESTHESIA N/A 02/14/2017   Procedure: RADIOLOGY WITH ANESTHESIA;  Surgeon: Radiologist, Medication, MD;  Location: [facility];  Service: Radiology;  Laterality: N/A;  . WRIST SURGERY           Family History  Problem Relation Age of Onset  . Heart disease Father   . Cancer Father   . Multiple sclerosis Sister   . Cancer Mother      Social History:  Married. Disabled Pharmacist, hospital. She  reports that she has never smoked. She has never used smokeless tobacco. She reports that she drinks about 1.8 oz of alcohol per week . She reports that she does not use drugs.        Allergies  Allergen Reactions  . Eliquis [Apixaban] Rash  . Penicillins           Medications Prior  to Admission  Medication Sig Dispense Refill  . aspirin EC 81 MG tablet Take 81 mg by mouth daily.    . Calcium Citrate-Vitamin D (CALCIUM CITRATE + D PO) Take 1 tablet by mouth daily.    Marland Kitchen FLUoxetine (PROZAC) 10 MG capsule Take 10 mg by mouth daily.    Marland Kitchen gabapentin (NEURONTIN) 300 MG capsule Take 300 mg by mouth as needed.  0  . Multiple Vitamins-Minerals (PRESERVISION AREDS PO) Take 1 tablet by mouth 2 (two) times daily.    Marland Kitchen omega-3 acid ethyl esters (LOVAZA) 1 g capsule Take 1 g by mouth 2 (two) times daily. 1200 mg    . pravastatin (PRAVACHOL)  10 MG tablet Take 10 mg by mouth daily.     Marland Kitchen venlafaxine XR (EFFEXOR-XR) 75 MG 24 hr capsule Take 75 mg by mouth daily.    . traMADol (ULTRAM) 50 MG tablet Take 1 tablet (50 mg total) by mouth every 6 (six) hours as needed for pain. (Patient not taking: Reported on 02/14/2017) 15 tablet 0    Home: Home Living Family/patient expects to be discharged to:: Inpatient rehab Living Arrangements: Spouse/significant other Available Help at Discharge: Family, Available 24 hours/day (no children) Type of Home: House Home Access: Stairs to enter [employer] of Steps: 4 Entrance Stairs-Rails: Right Home Layout: Multi-level Alternate Level Stairs-Number of Steps: 1 Bathroom Shower/Tub: Tub only, Multimedia programmer: Standard Bathroom Accessibility: Yes Home Equipment: Shower seat - built in  Lives With: Spouse   Functional History: Prior Function Level of Independence: Independent Comments: Pt gardening the morning she was admitted  Functional Status:  Mobility: Bed Mobility Overal bed mobility: Needs Assistance Bed Mobility: Supine to Sit, Sit to Supine Supine to sit: Mod assist Sit to supine: Mod assist General bed mobility comments: Patient able to move LEs to EOb, assist for UE pull to sit, moderate assist to rotate trunk and elevate to upright. Assist to elevated LEs back to bed Transfers Overall transfer level: Needs assistance Equipment used: 2 person hand held assist Transfers: Sit to/from Stand Sit to Stand: Mod assist, +2 safety/equipment Stand pivot transfers: Mod assist, +2 physical assistance, +2 safety/equipment Squat pivot transfers: Mod assist, +2 physical assistance, +2 safety/equipment General transfer comment: VCs for hand placement. Manual faciliatation of push up throughout R UE,  moderate assist to power up and maintain stablity in standing. Performed several times during session Ambulation/Gait Ambulation/Gait assistance: Mod assist,  +2 physical assistance, +2 safety/equipment Ambulation Distance (Feet): 30 Feet (3 trials) Assistive device: 2 person hand held assist Gait Pattern/deviations: Decreased stance time - right, Decreased step length - right, Decreased step length - left General Gait Details: multimodal cues for facilitation of upright posture, weight shift, stride and pacing. Patient fatigues quickly without UE support.  Gait velocity: Decreased Gait velocity interpretation: Below normal speed for age/gender  ADL: ADL Overall ADL's : Needs assistance/impaired Eating/Feeding: Moderate assistance, Sitting Grooming: Minimal assistance, Sitting, Cueing for safety, Cueing for sequencing, Moderate assistance Grooming Details (indicate cue type and reason): Min assist for single handed tasks after set-up; mod assist for bimanual tasks Upper Body Bathing: Moderate assistance, Sitting Lower Body Bathing: Maximal assistance, Sit to/from stand Upper Body Dressing : Moderate assistance, Sitting Lower Body Dressing: Maximal assistance, Sit to/from stand Toilet Transfer: Moderate assistance, +2 for physical assistance, +2 for safety/equipment, Stand-pivot Toileting- Clothing Manipulation and Hygiene: Maximal assistance, Sit to/from stand Functional mobility during ADLs: Moderate assistance, +2 for physical assistance General ADL Comments: Pt able to sit at  EOB to brush her hair with VC's for sequence.   Cognition: Cognition Overall Cognitive Status: Impaired/Different from baseline Arousal/Alertness: Lethargic Orientation Level: Oriented to person, Disoriented to situation, Disoriented to time, Disoriented to place Attention: Sustained Sustained Attention: Impaired Sustained Attention Impairment: Verbal basic Memory:  (TBA) Awareness: Impaired Awareness Impairment: Anticipatory impairment, Emergent impairment Problem Solving: Impaired Problem Solving Impairment: Functional basic Executive Function: Self  Monitoring, Self Correcting Safety/Judgment: Impaired Cognition Arousal/Alertness: Awake/alert Behavior During Therapy: Flat affect Overall Cognitive Status: Impaired/Different from baseline Area of Impairment: Orientation, Attention, Memory, Following commands, Safety/judgement, Awareness, Problem solving Orientation Level: Disoriented to, Situation Current Attention Level: Selective Memory: Decreased short-term memory, Decreased recall of precautions Following Commands: Follows one step commands with increased time Safety/Judgement: Decreased awareness of safety, Decreased awareness of deficits Awareness: Emergent Problem Solving: Slow processing, Decreased initiation, Difficulty sequencing, Requires verbal cues, Requires tactile cues General Comments: following commands throughout session more consistently today; increased FOF throughout activity   Blood pressure (!) 128/39, pulse (!) 58, temperature 98.9 F (37.2 C), temperature source Oral, resp. rate 16, height _0  (1.575 m), weight 59.6 kg (131 lb 6.3 oz), SpO2 98 %. Physical Exam  Constitutional: She appears well-developed and well-nourished.  HENT:  Head: Normocephalic and atraumatic.  Eyes: Pupils are equal, round, and reactive to light. Right eye exhibits no discharge. Left eye exhibits discharge. No scleral icterus.  Neck: No tracheal deviation present. No thyromegaly present.  Cardiovascular: Normal rate and regular rhythm.  Exam reveals no gallop and no friction rub.   No murmur heard. Respiratory: Effort normal. No respiratory distress. She has no rales.  GI: Soft. She exhibits no distension. There is no tenderness. There is no rebound.  Musculoskeletal: She exhibits no edema.  Neurological: She displays normal reflexes.  Lethargic but awakens fairly easily. Right central 7. Decreased sensation around right face and to a lesser extent right arm/leg.LUE and LLE 4/5. RUE: 2 to 2+/5 prox to distal. RLE: 3- to 3/5 prox  to distal---inconsistent effort. Delayed processing and some expressive language deficits. Comprehension fair.   Psychiatric:  Generally pleasant and appropriate    Lab Results Last 48 Hours        Results for orders placed or performed during the hospital encounter of 02/14/17 (from the past 48 hour(s))  Basic metabolic panel     Status: Abnormal   Collection Time: 02/21/17  2:35 AM  Result Value Ref Range   Sodium 136 135 - 145 mmol/L   Potassium 3.2 (L) 3.5 - 5.1 mmol/L   Chloride 105 101 - 111 mmol/L   CO2 25 22 - 32 mmol/L   Glucose, Bld 108 (H) 65 - 99 mg/dL   BUN 11 6 - 20 mg/dL   Creatinine, Ser 0.66 0.44 - 1.00 mg/dL   Calcium 8.3 (L) 8.9 - 10.3 mg/dL   GFR calc non Af Amer >60 >60 mL/min   GFR calc Af Amer >60 >60 mL/min    Comment: (NOTE) The eGFR has been calculated using the CKD EPI equation. This calculation has not been validated in all clinical situations. eGFR's persistently <60 mL/min signify possible Chronic Kidney Disease.    Anion gap 6 5 - 15  CBC     Status: Abnormal   Collection Time: 02/21/17  2:35 AM  Result Value Ref Range   WBC 8.9 4.0 - 10.5 K/uL   RBC 3.53 (L) 3.87 - 5.11 MIL/uL   Hemoglobin 10.7 (L) 12.0 - 15.0 g/dL   HCT 32.7 (L) 36.0 -  46.0 %   MCV 92.6 78.0 - 100.0 fL   MCH 30.3 26.0 - 34.0 pg   MCHC 32.7 30.0 - 36.0 g/dL   RDW 13.1 11.5 - 15.5 %   Platelets 233 150 - 400 K/uL     Imaging Results (Last 48 hours)  No results found.       Medical Problem List and Plan: 1.  Right hemiparesis and language deficits secondary to left MCA infarct             -admit to inpatient rehab.  2.  DVT Prophylaxis/Anticoagulation: Pharmaceutical: Xarelto 3. Pain Management: tylenol prn 4. Mood: LCSW to follow for evaluation and support.  5. Neuropsych: This patient is not capable of making decisions on her own behalf. 6. Skin/Wound Care: Routine pressure relief measures.  7. Fluids/Electrolytes/Nutrition:  Monitor I/O. Check lytes in am. Offer supplements between meals if intake poor.  8. A fib: Monitor HR bid. Continue amiodarone and metoprolol for HR control.   9. Aspiration PNA: Continue Levaquin Day # 4. Continue to encourage IS. Aspiration precautions.  10 Leukocytosis: Reactive to PNA and resolving with treatment. Recheck in am 11. Hypokalemia: Improving with supplement--2.8-->2.8-->3.2. . Will continue to supplement for now. Recheck in am.  12. ABLA: Will recheck CBC in am. Monitor for signs of bleeding.  13. Multiple Sclerosis: No medications? 14. H/o Depression: On Effexor and prozac.  68. H/o CAD s/p PTCA: On Lipitor  16. Lethargy: likely multifactorial due to altered sleep cycle,  Stroke, MS, pneumonia, sedative given last night             -check sleep chart             -limit neuro-sedating meds             -labs ordered for tomorrow             -trazodone prn tonight for insomnia   Post Admission Physician Evaluation: 1. Functional deficits secondary  to left MCA infarct. 2. Patient is admitted to receive collaborative, interdisciplinary care between the physiatrist, rehab nursing staff, and therapy team. 3. Patient's level of medical complexity and substantial therapy needs in context of that medical necessity cannot be provided at a lesser intensity of care such as a SNF. 4. Patient has experienced substantial functional loss from his/her baseline which was documented above under the "Functional History" and "Functional Status" headings.  Judging by the patient's diagnosis, physical exam, and functional history, the patient has potential for functional progress which will result in measurable gains while on inpatient rehab.  These gains will be of substantial and practical use upon discharge  in facilitating mobility and self-care at the household level. 5. Physiatrist will provide 24 hour management of medical needs as well as oversight of the therapy plan/treatment and provide  guidance as appropriate regarding the interaction of the two. 6. The Preadmission Screening has been reviewed and patient status is unchanged unless otherwise stated above. 7. 24 hour rehab nursing will assist with bladder management, bowel management, safety, skin/wound care, disease management, medication administration, pain management and patient education  and help integrate therapy concepts, techniques,education, etc. 8. PT will assess and treat for/with: Lower extremity strength, range of motion, stamina, balance, functional mobility, safety, adaptive techniques and equipment, NMR, family education, orthotics.   Goals are: supervision to min assist. 9. OT will assess and treat for/with: ADL's, functional mobility, safety, upper extremity strength, adaptive techniques and equipment, NMR, family education, orthotics.   Goals are: supervision  to min assist . Therapy may proceed with showering this patient. 10. SLP will assess and treat for/with: cognition, communication, language, swallowing, family ed.  Goals are: supervision to min assist. 11. Case Management and Social Worker will assess and treat for psychological issues and discharge planning. 12. Team conference will be held weekly to assess progress toward goals and to determine barriers to discharge. 13. Patient will receive at least 3 hours of therapy per day at least 5 days per week. 14. ELOS: 18-22 days       15. Prognosis:  excellent     Meredith Staggers, MD, [facility] 02/22/2017  Bary Leriche, Hershal Coria 02/22/2017

## 2017-02-22 NOTE — Progress Notes (Signed)
I met with pt and her spouse at bedside with Dr. Leonie Man. I have insurance approval and bed available to admit pt today. All are in agreement. I will notify RN CM and SW I will make the arrangements. [phone]

## 2017-02-22 NOTE — PMR Pre-admission (Signed)
PMR Admission Coordinator Pre-Admission Assessment  Patient: Kaitlin [name] is an 71 y.o., female MRN: [id number] DOB: [birth year] Height: 5' 2"  (157.5 cm) Weight: 59.6 kg (131 lb 6.3 oz)              Insurance Information HMO: yes    PPO:      PCP:      IPA:      80/20:      OTHER: medicare advantage plan PRIMARY: united health care Medicare      Policy#: [id number]      Subscriber: pt CM Name: Fulton Mole      Phone#: [phone]     Fax#: [phone] Pre-Cert#: [id number] approved for 7 days. F/u CM Vevelyn Royals phone [phone] fax [phone]      Employer: retired Benefits:  Phone #: [phone]     Name: 02/22/2017 Eff. Date: 09/30/2008     Deduct: none      Out of Pocket Max: $4400      Life Max: none CIR: $345 co pay per day days 1-5      SNF: no co pay days 1-20: $160 co pay per day days 21-48; no co pay days 49-100 Outpatient: $40 co pay per visit     Co-Pay: visits per medical neccesity Home Health: 100%      Co-Pay: visits per medical necessity DME: 80%     Co-Pay: 20% Providers: in network  SECONDARY: none      \ Medicaid Application Date:       Case Manager:  Disability Application Date:       Case Worker:   Emergency Contact Information Contact Information    Name Relation Home Work Mobile   Dunleavy,[name] Spouse [phone]       Current Medical History  Patient Admitting Diagnosis: left MCA infarct, history of MS  History of Present Illness: HPI: Kaitlin [name] is a 40 y.o. right handed female with a 20 year history of multiple sclerosis, CAD with stenting. Presented 02/14/2017 with right-sided weakness and garbled speech. CT showed focal hypodensity and left insula and left posterior putamen. Patient did not receive TPA. CT angiogram head and neck showed occlusion of the left common and internal carotid artery from the arch to the distal cavernous segment. CT cerebral perfusion scan showed left MCA distribution infarct. EKG showed atrial fibrillation. Echocardiogram  with ejection fraction of 60% grade 1 diastolic dysfunction. Underwent mechanical thrombectomy left ICA stenting per interventional radiology.Marland Kitchen MRI 02/15/2017 showed acute confluent infarct left putamen, caudate head and corona radiata as well as tiny cortical infarcts in the left MCA distribution. Patient did require intubation for a short time. Bilateral lower extremity Dopplers negative. Cardiology follow-up for atrial fibrillation initially placed on amiodarone with rate control. Patient presently on aspirin as well as Brilinta for CVA prophylaxis in light of atrial fibrillation. Dysphagia #2 thin liquid diet.   Pt had transient hematuria 02/20/2017 which appears to be stable per neurology. Hematocrit stable.Now on aspirin and brilinta due to high P2Y12 level. Cardiology recommend eliquis with plavix once pt stable for anticoagulation. Dr. Estanislado Pandy in agreement Eliquis for anticoagulation and Plavix for Stent.  Left CCA/ICA occlusion s/p thrombectomy and likely due to undiagnosed afib pta.  Total: 10 NIHSS  Past Medical History  Past Medical History:  Diagnosis Date  . CAD S/P percutaneous coronary angioplasty 2001  . CVA (cerebral vascular accident) ([place]) 01/2017  . HCVD (hypertensive cardiovascular disease) 01/2017   normal LVF with moderate LVH, grade 1 DD  . High  cholesterol   . Multiple sclerosis ([place])   . PAF (paroxysmal atrial fibrillation) ([place]) 01/2017    Family History  family history is not on file.  Prior Rehab/Hospitalizations:  Has the patient had major surgery during 100 days prior to admission? No  Current Medications   Current Facility-Administered Medications:  .  0.9 %  sodium chloride infusion, , Intravenous, Continuous, Rosalin Hawking, MD, Last Rate: 50 mL/hr at 02/21/17 2102 .  acetaminophen (TYLENOL) tablet 650 mg, 650 mg, Oral, Q4H PRN **OR** [DISCONTINUED] acetaminophen (TYLENOL) solution 650 mg, 650 mg, Per Tube, Q4H PRN, 650 mg at 02/15/17 2048 **OR**  [DISCONTINUED] acetaminophen (TYLENOL) suppository 650 mg, 650 mg, Rectal, Q4H PRN, Deveshwar, Sanjeev, MD .  amiodarone (PACERONE) tablet 200 mg, 200 mg, Oral, Daily, Nahser, Wonda Cheng, MD, 200 mg at 02/22/17 0927 .  atorvastatin (LIPITOR) tablet 40 mg, 40 mg, Oral, q1800, Kerney Elbe, MD, 40 mg at 02/21/17 1700 .  calcium-vitamin D (OSCAL WITH D) 500-200 MG-UNIT per tablet 1 tablet, 1 tablet, Oral, Daily, Honor Loh, [place], 1 tablet at 02/22/17 7672 .  chlorhexidine gluconate (MEDLINE KIT) (PERIDEX) 0.12 % solution 15 mL, 15 mL, Mouth Rinse, BID, Kerney Elbe, MD, 15 mL at 02/22/17 0815 .  clopidogrel (PLAVIX) tablet 75 mg, 75 mg, Oral, Daily, Rinehuls, David L, PA-C, 75 mg at 02/22/17 0926 .  FLUoxetine (PROZAC) capsule 10 mg, 10 mg, Oral, Daily, Kerney Elbe, MD, 10 mg at 02/22/17 0926 .  gabapentin (NEURONTIN) capsule 300 mg, 300 mg, Oral, PRN, Kerney Elbe, MD .  levofloxacin (LEVAQUIN) tablet 750 mg, 750 mg, Oral, q1800, Antony Contras S, MD .  metoprolol tartrate (LOPRESSOR) tablet 50 mg, 50 mg, Oral, BID, Dorothy Spark, MD, 50 mg at 02/22/17 0926 .  multivitamin (PROSIGHT) tablet 1 tablet, 1 tablet, Oral, BID, Kerney Elbe, MD, 1 tablet at 02/22/17 0925 .  pantoprazole (PROTONIX) EC tablet 40 mg, 40 mg, Oral, Daily, Alvira Philips, [place], 40 mg at 02/22/17 0947 .  rivaroxaban (XARELTO) tablet 20 mg, 20 mg, Oral, Q supper, Wynell Balloon, RPH, 20 mg at 02/21/17 1659 .  traZODone (DESYREL) tablet 50 mg, 50 mg, Oral, QHS, Patteson, Samuel A, NP, 50 mg at 02/21/17 2149 .  venlafaxine XR (EFFEXOR-XR) 24 hr capsule 75 mg, 75 mg, Oral, Daily, Kerney Elbe, MD, 75 mg at 02/22/17 0962  Patients Current Diet: DIET DYS 2 Room service appropriate? Yes; Fluid consistency: Thin meds crushed with puree  Precautions / Restrictions Precautions Precautions: Fall Precaution Comments: R side inattention Restrictions Weight Bearing Restrictions: No   Has the patient had 2 or more  falls or a fall with injury in the past year?No  Prior Activity Level Community (5-7x/wk): pt independent , driving without AD pta. loved to garden. 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 None of the above  Prior Functional Level Prior Function Level of Independence: Independent Comments: Pt gardening the morning she was admitted  Self Care: Did the patient need help bathing, dressing, using the toilet or eating?  Independent  Indoor Mobility: Did the patient need assistance with walking from room to room (with or without device)? Independent  Stairs: Did the patient need assistance with internal or external stairs (with or without device)? Independent  Functional Cognition: Did the patient need help  planning regular tasks such as shopping or remembering to take medications? Independent  Current Functional Level Cognition  Arousal/Alertness: Lethargic Overall Cognitive Status: Impaired/Different from baseline Current Attention Level: Selective Orientation Level: Oriented to person, Disoriented to situation, Disoriented to time, Disoriented to place Following Commands: Follows one step commands with increased time Safety/Judgement: Decreased awareness of safety, Decreased awareness of deficits General Comments: Pt able to follow one-step commands during session and requiring increased time and multimodal cues as session progressed likely due to fatigue.  Attention: Sustained Sustained Attention: Impaired Sustained Attention Impairment: Verbal basic Memory:  (TBA) Awareness: Impaired Awareness Impairment: Anticipatory impairment, Emergent impairment Problem Solving: Impaired Problem Solving Impairment: Functional basic Executive Function: Self  Monitoring, Self Correcting Safety/Judgment: Impaired    Extremity Assessment (includes Sensation/Coordination)  Upper Extremity Assessment: RUE deficits/detail RUE Deficits / Details: Noted inattention to R UE at times. Noted 2-/5 strength at elbow this session and slight movement in digits.   Lower Extremity Assessment: Defer to PT evaluation RLE Deficits / Details: Significant RLE weakness noted. Appeared that pt was attempting to move it but was not able to. Not able to participate in MMT or sensation testing.     ADLs  Overall ADL's : Needs assistance/impaired Eating/Feeding: Moderate assistance, Sitting Grooming: Minimal assistance, Sitting, Cueing for safety, Cueing for sequencing, Moderate assistance Grooming Details (indicate cue type and reason): Min assist for single handed tasks after set-up; mod assist for bimanual tasks Upper Body Bathing: Moderate assistance, Sitting Lower Body Bathing: Maximal assistance, Sit to/from stand Upper Body Dressing : Moderate assistance, Sitting Lower Body Dressing: Maximal assistance, Sit to/from stand Toilet Transfer: Moderate assistance, +2 for physical assistance, +2 for safety/equipment, Stand-pivot Toileting- Clothing Manipulation and Hygiene: Maximal assistance, Sit to/from stand Functional mobility during ADLs: Moderate assistance, +2 for physical assistance General ADL Comments: Pt able to sit at EOB to brush her hair with VC's for sequence.     Mobility  Overal bed mobility: Needs Assistance Bed Mobility: Supine to Sit Supine to sit: Mod assist, +2 for safety/equipment General bed mobility comments: Pt able to initiate B LE off of bed and requiring mod assist to pull trunk into upright position.     Transfers  Overall transfer level: Needs assistance Equipment used: 2 person hand held assist Transfers: Sit to/from Stand Sit to Stand: Mod assist, +2 safety/equipment Stand pivot transfers: Mod assist, +2 physical assistance, +2  safety/equipment Squat pivot transfers: Mod assist, +2 physical assistance, +2 safety/equipment General transfer comment: VC's for hand placement on seated surface for safety. Pt initiated rise to stand without assist, however required therapist assist for completion of power-up to full standing position. Once standing, pt immediately initiated ambulation. Required multimodal cues to slow pt and reposition feet before initiation of ambulation.     Ambulation / Gait / Stairs / Wheelchair Mobility  Ambulation/Gait Ambulation/Gait assistance: Mod assist, +2 physical assistance, +2 safety/equipment Ambulation Distance (Feet): 75 Feet Assistive device: 2 person hand held assist Gait Pattern/deviations: Decreased stance time - right, Decreased step length - right, Decreased step length - left General Gait Details: Multimodal cues for upright posture and stability in R stance; assist to advance R foot in swing; noted improving ability to organize center of mass over feet Gait velocity: Decreased Gait velocity interpretation: Below normal speed for age/gender    Posture / Balance Dynamic Sitting Balance Sitting balance - Comments: Able to maintain sitting balance with min guard to supervision for safety while brushing hair. Balance Overall balance assessment: Needs assistance Sitting-balance  support: Feet supported, No upper extremity supported Sitting balance-Leahy Scale: Fair Sitting balance - Comments: Able to maintain sitting balance with min guard to supervision for safety while brushing hair. Postural control: Posterior lean (in standing) Standing balance support: Bilateral upper extremity supported, During functional activity Standing balance-Leahy Scale: Poor Standing balance comment: +2 assist required     Special needs/care consideration BiPAP/CPAP  N/a CPM  N/a Continuous Drip IV  N/a Dialysis n/a Life Vest  N/a Oxygen  N/a Special Bed  N/a Trach Size  N/a Wound Vac n/a Skin  ecchymosis BUE; r side of upper and lower lip noted with small amount blood 02/23/17 Bowel mgmt:  No LBM documented Bladder mgmt: external catheter Diabetic mgmt Hgb A1c 5.2 New diagnosis PAF this admission Decreased safety awareness/has bedside sitter on acute   Previous Home Environment Living Arrangements: Spouse/significant other  Lives With: Spouse Available Help at Discharge: Family, Available 24 hours/day (no children) Type of Home: House Home Layout: Multi-level Alternate Level Stairs-Number of Steps: 1 Home Access: Stairs to enter Entrance Stairs-Rails: Right Entrance Stairs-Number of Steps: 4 Bathroom Shower/Tub: Tub only, Multimedia programmer: Standard Bathroom Accessibility: Yes How Accessible: Accessible via walker Home Care Services: No  Discharge Living Setting Plans for Discharge Living Setting: Patient's home, Lives with (comment) (spouse) Type of Home at Discharge: House Discharge Home Layout: Multi-level Discharge Home Access: Stairs to enter Entrance Stairs-Rails: Right Entrance Stairs-Number of Steps: 4 Discharge Bathroom Shower/Tub: Tub only, Walk-in shower Discharge Bathroom Toilet: Standard Discharge Bathroom Accessibility: Yes How Accessible: Accessible via walker Does the patient have any problems obtaining your medications?: No  Social/Family/Support Systems Patient Roles: Spouse Contact Information: [name], spouse Anticipated Caregiver: spouse Anticipated Ambulance person Information: see above Ability/Limitations of Caregiver: none Caregiver Availability: 24/7 Discharge Plan Discussed with Primary Caregiver: Yes Is Caregiver In Agreement with Plan?: Yes Does Caregiver/Family have Issues with Lodging/Transportation while Pt is in Rehab?: No  Goals/Additional Needs Patient/Family Goal for Rehab: supervision to min assist with PT, OT, and SLP Expected length of stay: [facility] 18-21 days Dietary Needs: D2 with thin liquids; pills crushed  in puree Pt/Family Agrees to Admission and willing to participate: Yes  Barriers to Discharge: Incontinence, Behavior (using bedside sitter on acute hospital due to decreased safe)  Barriers to Discharge Comments: recommended injury prevention bundle and telesitter  Decrease burden of Care through IP rehab admission: n/a  Possible need for SNF placement upon discharge: not anticipated  Patient Condition: This patient's medical and functional status has changed since the consult dated: 7/22/20918 in which the Rehabilitation Physician determined and documented that the patient's condition is appropriate for intensive rehabilitative care in an inpatient rehabilitation facility. See "History of Present Illness" (above) for medical update. Functional changes are: mod assist. Patient's medical and functional status update has been discussed with the Rehabilitation physician and patient remains appropriate for inpatient rehabilitation. Will admit to inpatient rehab today.  Preadmission Screen Completed By:  Cleatrice Burke, 02/22/2017 11:29 AM ______________________________________________________________________   Discussed status with Dr. Naaman Plummer on 02/22/2017 at  1128 and received telephone approval for admission today.  Admission Coordinator:  Cleatrice Burke, time 6160 Date 02/22/2017.

## 2017-02-22 NOTE — Care Management Note (Signed)
Case Management Note  Patient Details  Name: Kaitlin [name] MRN: [id number] Date of Birth: 01-09-1946  Subjective/Objective:                    Action/Plan: Pt discharging to CIR today. No further needs per CM.   Expected Discharge Date:                  Expected Discharge Plan:  IP Rehab Facility  In-House Referral:     Discharge planning Services  CM Consult  Post Acute Care Choice:    Choice offered to:     DME Arranged:    DME Agency:     HH Arranged:    HH Agency:     Status of Service:  Completed, signed off  If discussed at [employer] of Stay Meetings, dates discussed:    Additional Comments:  Kermit Balo, RN 02/22/2017, 1:23 PM

## 2017-02-22 NOTE — Progress Notes (Signed)
  Speech Language Pathology Treatment: Dysphagia;Cognitive-Linquistic  Patient Details Name: Kaitlin [name] MRN: [id number] DOB: [birth year] Today's Date: 02/22/2017 Time: 7829-5621 SLP Time Calculation (min) (ACUTE ONLY): 49 min  Assessment / Plan / Recommendation Clinical Impression  SLP continued intervention for dysphagia and cognitive linguistics with spouse at bedside. Treatment occurred just after lunch meal. Pt lethargic, though arousable with moderate to maximal cues. Pt with oral residuals and pocketting noted which improved with oral care. However coating remained on lingual area which was reported to RN. Reinforced education regarding no straw usage as deficits observed on MBS 7/19, though straws at bedside table in pts drinks. Straws removed, safe swallow visual aid posted at bedside, education also reinforced to staff and family. Pt with overt cough following thin liquids by cup sip, likely impacted by fatigue this date. Continue dysphagia 2 (chopped) and thin liquids by cup sip with full supervision with all PO.  Language facilitation attempted at bedside with spontaneous speech and use of visual aids as well as functional bedside objects. Pt named 3/4 familiar objects. Vocal intensity was signficantly reduced this date impairing speech intelligibility. Attempted use of incentive spirometer for respiratory strength however pt with poor coordination for usage and suspected break down with cognitive abilities for following multi step commands for implementation. Spouse reports pts expressive and language abilities have fluctuated since CVA onset. Expressive speech errors persisted including dysfluency, neologisms, and paraphasias. Recommend continue ST intervention for cognitive linguistics and dysphagia.     HPI HPI: Patient's 71 year old female with past medical history significant for multiple sclerosis, high cholesterol and ACDF (age indeterminate) admitted with stuttering symptoms, somewhat  resolved and right hand weakness. MRI showed acute confluent infarct involving the left putamen, caudate body, and corona radiata, tiny cortical infarcts in the left MCA distribution, extensive chronic white matter disease, some combination of patient's multiple sclerosis and chronic microvascular ischemia. Pt underwent revascularization and left ICA angioplasty stent 7/17. RN reports intubated total of 30 hours.       SLP Plan          Recommendations  Diet recommendations: Dysphagia 2 (fine chop);Thin liquid Liquids provided via: Cup;No straw Medication Administration: Whole meds with puree Supervision: Staff to assist with self feeding;Full supervision/cueing for compensatory strategies Compensations: Slow rate;Small sips/bites;Minimize environmental distractions;Multiple dry swallows after each bite/sip Postural Changes and/or Swallow Maneuvers: Seated upright 90 degrees                Oral Care Recommendations: Oral care BID Follow up Recommendations: Inpatient Rehab SLP Visit Diagnosis: Dysphagia, oropharyngeal phase (R13.12);Aphasia (R47.01)       GO               Marcene Duos MA, CCC-SLP Acute Care Speech Language Pathologist    Kennieth Rad 02/22/2017, 2:18 PM

## 2017-02-22 NOTE — Progress Notes (Signed)
Kirsteins, Victorino Sparrow, MD Physician Signed Physical Medicine and Rehabilitation  Consult Note Date of Service: 02/18/2017 2:16 PM  Related encounter: ED to Hosp-Admission (Current) from 02/14/2017 in [facility]     Expand All Collapse All   [] Hide copied text [] Hover for attribution information      Physical Medicine and Rehabilitation Consult Reason for Consult: Right sided weakness and aphasia with history of multiple sclerosis Referring Physician: Dr.Xu   HPI: Kaitlin [name] is a 71 y.o. right handed female with a 20 year history of multiple sclerosis, CAD with stenting. Per chart review patient lives with spouse. Independent prior to admission and enjoys gardening. Multilevel home with 4 steps to entry. Presented 02/14/2017 with right-sided weakness and garbled speech. CT showed focal hypodensity and left insula and left posterior putamen. Patient did not receive TPA. CT angiogram head and neck showed occlusion of the left common and internal carotid artery from the arch to the distal cavernous segment. CT cerebral perfusion scan showed left MCA distribution infarct. EKG showed atrial fibrillation. Echocardiogram with ejection fraction of 70% grade 1 diastolic dysfunction. Underwent mechanical thrombectomy left ICA stenting per interventional radiology.Marland Kitchen MRI 02/15/2017 showed acute confluent infarct left putamen, caudate head and corona radiata as well as tiny cortical infarcts in the left MCA distribution. Patient did require intubation for a short time. Bilateral lower extremity Dopplers negative. Cardiology follow-up for atrial fibrillation initially placed on amiodarone with rate control. Patient presently on aspirin as well as Brilinta for CVA prophylaxis in light of atrial fibrillation. Dysphagia #2 thin liquid diet. Physical therapy evaluation completed 02/17/2017 with recommendations of physical medicine rehabilitation consult.  Patient is  severely dysarthric, however, she is able to state that she was able to ambulate without assisted device prior to CVA Has a sitter, according to sitter, patient has not been trying to get out of bed or out of her chair  Review of Systems  Constitutional: Negative for chills and fever.  HENT: Negative for hearing loss.   Eyes: Negative for blurred vision and double vision.  Respiratory: Negative for cough and shortness of breath.   Cardiovascular: Negative for chest pain, palpitations and leg swelling.  Gastrointestinal: Positive for constipation. Negative for nausea and vomiting.  Genitourinary: Negative for dysuria, flank pain and hematuria.  Musculoskeletal: Positive for myalgias.  Skin: Negative for rash.  Neurological: Positive for speech change and weakness. Negative for seizures.  Psychiatric/Behavioral: Positive for depression.  All other systems reviewed and are negative.      Past Medical History:  Diagnosis Date  . High cholesterol   . Multiple sclerosis (HCC)         Past Surgical History:  Procedure Laterality Date  . CORONARY ANGIOPLASTY WITH STENT PLACEMENT    . IR ANGIO INTRA EXTRACRAN SEL COM CAROTID INNOMINATE UNI R MOD SED  02/15/2017  . IR ANGIO VERTEBRAL SEL SUBCLAVIAN INNOMINATE UNI R MOD SED  02/15/2017  . IR INTRAVSC STENT CERV CAROTID W/O EMB-PROT MOD SED INC ANGIO  02/15/2017  . IR PERCUTANEOUS ART THROMBECTOMY/INFUSION INTRACRANIAL INC DIAG ANGIO  02/15/2017  . NECK SURGERY    . RADIOLOGY WITH ANESTHESIA N/A 02/14/2017   Procedure: RADIOLOGY WITH ANESTHESIA;  Surgeon: Radiologist, Medication, MD;  Location: [facility] OR;  Service: Radiology;  Laterality: N/A;  . WRIST SURGERY     No family history on file. Social History:  reports that she has never smoked. She has never used smokeless tobacco. She reports that she drinks about  1.8 oz of alcohol per week . She reports that she does not use drugs. Allergies:      Allergies  Allergen Reactions  .  Penicillins          Medications Prior to Admission  Medication Sig Dispense Refill  . aspirin EC 81 MG tablet Take 81 mg by mouth daily.    . Calcium Citrate-Vitamin D (CALCIUM CITRATE + D PO) Take 1 tablet by mouth daily.    Marland Kitchen FLUoxetine (PROZAC) 10 MG capsule Take 10 mg by mouth daily.    Marland Kitchen gabapentin (NEURONTIN) 300 MG capsule Take 300 mg by mouth as needed.  0  . Multiple Vitamins-Minerals (PRESERVISION AREDS PO) Take 1 tablet by mouth 2 (two) times daily.    Marland Kitchen omega-3 acid ethyl esters (LOVAZA) 1 g capsule Take 1 g by mouth 2 (two) times daily. 1200 mg    . pravastatin (PRAVACHOL) 10 MG tablet Take 10 mg by mouth daily.     Marland Kitchen venlafaxine XR (EFFEXOR-XR) 75 MG 24 hr capsule Take 75 mg by mouth daily.    . traMADol (ULTRAM) 50 MG tablet Take 1 tablet (50 mg total) by mouth every 6 (six) hours as needed for pain. (Patient not taking: Reported on 02/14/2017) 15 tablet 0    Home: Home Living Family/patient expects to be discharged to:: Inpatient rehab Living Arrangements: Spouse/significant other Available Help at Discharge: Family, Available 24 hours/day Type of Home: House Home Access: Stairs to enter [employer] of Steps: 4 Entrance Stairs-Rails: Right Home Layout: Multi-level Alternate Level Stairs-Number of Steps: 1 (to bedroom; none to bathroom) Bathroom Shower/Tub: Tub only, Health visitor: Standard Bathroom Accessibility: Yes Home Equipment: Shower seat - built in  Lives With: Spouse  Functional History: Prior Function Level of Independence: Independent Comments: Pt gardening the morning she was admitted Functional Status:  Mobility: Bed Mobility Overal bed mobility: Needs Assistance Bed Mobility: Supine to Sit Supine to sit: Mod assist, +2 for physical assistance, +2 for safety/equipment, HOB elevated General bed mobility comments: Pt with increased initiation of movement towards EOB with cues. Assist provided for RLE  advancement and general trunk support with raise to sitting. Bed pad used to assist in scooting fully to EOB.  Transfers Overall transfer level: Needs assistance Equipment used: 2 person hand held assist Transfers: Sit to/from Stand, [employer] Transfers Sit to Stand: Mod assist, +2 physical assistance, +2 safety/equipment Squat pivot transfers: Mod assist, +2 physical assistance, +2 safety/equipment General transfer comment: Bed pad used to assist in squat pivot bed>chair. Pt able to advance LE's without assist, however noted continued significant weakness in RLE.  Ambulation/Gait General Gait Details: Unable at this time  ADL:  Cognition: Cognition Overall Cognitive Status: Impaired/Different from baseline Arousal/Alertness: Lethargic Orientation Level: Oriented to person, Oriented to time, Disoriented to place, Disoriented to situation Attention: Sustained Sustained Attention: Impaired Sustained Attention Impairment: Verbal basic Memory:  (TBA) Awareness: Impaired Awareness Impairment: Anticipatory impairment, Emergent impairment Problem Solving: Impaired Problem Solving Impairment: Functional basic Executive Function: Self Monitoring, Self Correcting Safety/Judgment: Impaired Cognition Arousal/Alertness: Awake/alert Behavior During Therapy: Flat affect Overall Cognitive Status: Impaired/Different from baseline Area of Impairment: Orientation, Attention, Memory, Following commands, Safety/judgement, Awareness, Problem solving Orientation Level: Disoriented to, Place, Time, Situation Current Attention Level: Sustained Memory: Decreased short-term memory, Decreased recall of precautions Following Commands: Follows one step commands inconsistently, Follows one step commands with increased time Safety/Judgement: Decreased awareness of safety, Decreased awareness of deficits Awareness: Intellectual Problem Solving: Slow processing, Decreased initiation, Difficulty  sequencing,  Requires verbal cues, Requires tactile cues  Blood pressure (!) 147/58, pulse 65, temperature 98.3 F (36.8 C), resp. rate (!) 23, height 5\' 2"  (1.575 m), weight 59.6 kg (131 lb 6.3 oz), SpO2 97 %. Physical Exam  HENT:  Head: Normocephalic.  Eyes: EOM are normal.  Neck: Normal range of motion. Neck supple. No thyromegaly present.  Cardiovascular: Normal rate and regular rhythm.   Respiratory: Effort normal and breath sounds normal. No respiratory distress.  GI: Soft. Bowel sounds are normal. She exhibits no distension.  Neurological: She is alert.  Speech is a bit slurred. Follows simple commands. Provides name and age. Fair awareness of deficits  Skin: Skin is dry.  Motor strength is 2 minus in the right deltoid, bicep, triceps, finger flexors and trace extensors. 3 minus, right hip flexor, knee extensors, 0 at the ankle dorsiflexor, plantar flexor toe extensors and flexors. 4 plus in the left deltoid, bicep, tricep, grip, hip flexor, knee extensor, ankle dorsiflexor and plantar flexor. Sensation is reported as equal bilateral upper limbs, as well as bilateral lower limbs, although dysarthria does make sensory testing difficult  Lab Results Last 24 Hours       Results for orders placed or performed during the hospital encounter of 02/14/17 (from the past 24 hour(s))  Basic metabolic panel     Status: Abnormal   Collection Time: 02/18/17  2:15 AM  Result Value Ref Range   Sodium 139 135 - 145 mmol/L   Potassium 3.6 3.5 - 5.1 mmol/L   Chloride 109 101 - 111 mmol/L   CO2 21 (L) 22 - 32 mmol/L   Glucose, Bld 114 (H) 65 - 99 mg/dL   BUN 16 6 - 20 mg/dL   Creatinine, Ser 1.61 0.44 - 1.00 mg/dL   Calcium 8.2 (L) 8.9 - 10.3 mg/dL   GFR calc non Af Amer >60 >60 mL/min   GFR calc Af Amer >60 >60 mL/min   Anion gap 9 5 - 15  CBC     Status: Abnormal   Collection Time: 02/18/17  2:15 AM  Result Value Ref Range   WBC 15.9 (H) 4.0 - 10.5 K/uL   RBC 3.33 (L) 3.87 - 5.11  MIL/uL   Hemoglobin 10.5 (L) 12.0 - 15.0 g/dL   HCT 09.6 (L) 04.5 - 40.9 %   MCV 94.6 78.0 - 100.0 fL   MCH 31.5 26.0 - 34.0 pg   MCHC 33.3 30.0 - 36.0 g/dL   RDW 81.1 91.4 - 78.2 %   Platelets 210 150 - 400 K/uL  Magnesium     Status: None   Collection Time: 02/18/17  2:15 AM  Result Value Ref Range   Magnesium 2.1 1.7 - 2.4 mg/dL      Imaging Results (Last 48 hours)  Dg Chest Port 1 View  Result Date: 02/18/2017 CLINICAL DATA:  Crackles in both lungs this morning on physical examination. EXAM: PORTABLE CHEST 1 VIEW COMPARISON:  Single-view of the chest 02/16/2017. PA and lateral chest and CT chest 04/26/2012. FINDINGS: There is a small to moderate right pleural effusion. Airspace disease is seen in the right lower and right upper lobes. There is also some airspace disease in the medial left lower lobe. No pneumothorax. IMPRESSION: Right much worse than left airspace disease has an appearance most worrisome for pneumonia. Associated small to moderate right pleural effusion is noted. Electronically Signed   By: Drusilla Kanner M.D.   On: 02/18/2017 12:20   Dg Swallowing Func-speech Pathology  Result Date: 02/17/2017 Objective Swallowing Evaluation: Type of Study: MBS-Modified Barium Swallow Study Patient Details Name: Alesi Zachery MRN: [id number] Date of Birth: [birth year] Today's Date: 02/17/2017 Time: SLP Start Time (ACUTE ONLY): 1007-SLP Stop Time (ACUTE ONLY): 1022 SLP Time Calculation (min) (ACUTE ONLY): 15 min Past Medical History: Past Medical History: Diagnosis Date . High cholesterol  . Multiple sclerosis (HCC)  Past Surgical History: Past Surgical History: Procedure Laterality Date . CORONARY ANGIOPLASTY WITH STENT PLACEMENT   . IR ANGIO INTRA EXTRACRAN SEL COM CAROTID INNOMINATE UNI R MOD SED  02/15/2017 . IR ANGIO VERTEBRAL SEL SUBCLAVIAN INNOMINATE UNI R MOD SED  02/15/2017 . IR INTRAVSC STENT CERV CAROTID W/O EMB-PROT MOD SED INC ANGIO  02/15/2017 . IR PERCUTANEOUS ART  THROMBECTOMY/INFUSION INTRACRANIAL INC DIAG ANGIO  02/15/2017 . NECK SURGERY   . RADIOLOGY WITH ANESTHESIA N/A 02/14/2017  Procedure: RADIOLOGY WITH ANESTHESIA;  Surgeon: Radiologist, Medication, MD;  Location: [facility] OR;  Service: Radiology;  Laterality: N/A; . WRIST SURGERY   HPI: Patient's 71 year old female with past medical history significant for multiple sclerosis, high cholesterol and ACDF (age indeterminate) admitted with stuttering symptoms, somewhat resolved and right hand weakness. MRI showed acute confluent infarct involving the left putamen, caudate body, and corona radiata, tiny cortical infarcts in the left MCA distribution, extensive chronic white matter disease, some combination of patient's multiple sclerosis and chronic microvascular ischemia. Pt underwent revascularization and left ICA angioplasty stent 7/17. RN reports intubated total of 30 hours.  No Data Recorded Assessment / Plan / Recommendation CHL IP CLINICAL IMPRESSIONS 02/17/2017 Clinical Impression Pt exhibits min-mild oropharyngeal dysphagia. Decreased oral cohesion resulting in premature spill x 1 into the laryngeal vestibule with trace remaining on upper anterior wall post swallow. Flash laryngeal penetration with straw sip thin likely due to increased velocity and volume. Mild intermittent vallecular stasis cleared with spontaneous additional swalllow. Recommend Dys 2 texture given oral deficits/deconditioning, thin liquids, no straws, pills whole in puree, sit upright, small bites/sips. Will continue to follow for safety with recommendations.   SLP Visit Diagnosis Dysphagia, oropharyngeal phase (R13.12) Attention and concentration deficit following -- Frontal lobe and executive function deficit following -- Impact on safety and function Moderate aspiration risk   CHL IP TREATMENT RECOMMENDATION 02/17/2017 Treatment Recommendations Therapy as outlined in treatment plan below   Prognosis 02/17/2017 Prognosis for Safe Diet Advancement Good  Barriers to Reach Goals -- Barriers/Prognosis Comment -- CHL IP DIET RECOMMENDATION 02/17/2017 SLP Diet Recommendations Dysphagia 2 (Fine chop) solids;Thin liquid Liquid Administration via Cup;No straw Medication Administration Whole meds with puree Compensations Slow rate;Small sips/bites;Minimize environmental distractions;Multiple dry swallows after each bite/sip Postural Changes Seated upright at 90 degrees   CHL IP OTHER RECOMMENDATIONS 02/17/2017 Recommended Consults -- Oral Care Recommendations Oral care BID Other Recommendations --   CHL IP FOLLOW UP RECOMMENDATIONS 02/17/2017 Follow up Recommendations Inpatient Rehab   CHL IP FREQUENCY AND DURATION 02/17/2017 Speech Therapy Frequency (ACUTE ONLY) min 2x/week Treatment Duration 2 weeks      CHL IP ORAL PHASE 02/17/2017 Oral Phase Impaired Oral - Pudding Teaspoon -- Oral - Pudding Cup -- Oral - Honey Teaspoon -- Oral - Honey Cup -- Oral - Nectar Teaspoon -- Oral - Nectar Cup Premature spillage Oral - Nectar Straw -- Oral - Thin Teaspoon -- Oral - Thin Cup Holding of bolus Oral - Thin Straw WFL;Weak lingual manipulation Oral - Puree -- Oral - Mech Soft -- Oral - Regular Delayed oral transit Oral - Multi-Consistency WFL Oral - Pill -- Oral Phase - Comment --  CHL IP PHARYNGEAL PHASE 02/17/2017 Pharyngeal Phase Impaired Pharyngeal- Pudding Teaspoon -- Pharyngeal -- Pharyngeal- Pudding Cup -- Pharyngeal -- Pharyngeal- Honey Teaspoon -- Pharyngeal -- Pharyngeal- Honey Cup -- Pharyngeal -- Pharyngeal- Nectar Teaspoon -- Pharyngeal -- Pharyngeal- Nectar Cup Penetration/Aspiration before swallow;Reduced airway/laryngeal closure Pharyngeal Material enters airway, remains ABOVE vocal cords and not ejected out Pharyngeal- Nectar Straw -- Pharyngeal -- Pharyngeal- Thin Teaspoon -- Pharyngeal -- Pharyngeal- Thin Cup Penetration/Aspiration during swallow;Pharyngeal residue - valleculae Pharyngeal Material enters airway, remains ABOVE vocal cords then ejected out Pharyngeal- Thin  Straw Penetration/Aspiration during swallow;Pharyngeal residue - valleculae Pharyngeal -- Pharyngeal- Puree -- Pharyngeal -- Pharyngeal- Mechanical Soft -- Pharyngeal -- Pharyngeal- Regular WFL Pharyngeal -- Pharyngeal- Multi-consistency WFL Pharyngeal -- Pharyngeal- Pill -- Pharyngeal -- Pharyngeal Comment --  CHL IP CERVICAL ESOPHAGEAL PHASE 02/17/2017 Cervical Esophageal Phase WFL Pudding Teaspoon -- Pudding Cup -- Honey Teaspoon -- Honey Cup -- Nectar Teaspoon -- Nectar Cup -- Nectar Straw -- Thin Teaspoon -- Thin Cup -- Thin Straw -- Puree -- Mechanical Soft -- Regular -- Multi-consistency -- Pill -- Cervical Esophageal Comment -- No flowsheet data found. Royce Macadamia 02/17/2017, 1:26 PM Breck Coons Lonell Face.Ed CCC-SLP Pager [phone]                Assessment/Plan: Diagnosis: Left MCA infarct with right hemiparesis, dysphagia, dysarthria 1. Does the need for close, 24 hr/day medical supervision in concert with the patient's rehab needs make it unreasonable for this patient to be served in a less intensive setting? Yes 2. Co-Morbidities requiring supervision/potential complications: Multiple sclerosis, coronary artery disease 3. Due to bladder management, bowel management, safety, skin/wound care, disease management, medication administration, pain management and patient education, does the patient require 24 hr/day rehab nursing? Yes 4. Does the patient require coordinated care of a physician, rehab nurse, PT (1-2 hrs/day, 5 days/week), OT (1-2 hrs/day, 5 days/week) and SLP (.5-1 hrs/day, 5 days/week) to address physical and functional deficits in the context of the above medical diagnosis(es)? Yes Addressing deficits in the following areas: balance, endurance, locomotion, strength, transferring, bowel/bladder control, bathing, dressing, feeding, grooming, toileting, cognition, speech, language, swallowing and psychosocial support 5. Can the patient actively participate in an intensive  therapy program of at least 3 hrs of therapy per day at least 5 days per week? Yes 6. The potential for patient to make measurable gains while on inpatient rehab is excellent 7. Anticipated functional outcomes upon discharge from inpatient rehab are supervision  with PT, supervision and min assist with OT, supervision and min assist with SLP. 8. Estimated rehab length of stay to reach the above functional goals is: 18-21d 9. Anticipated D/C setting: Home 10. Anticipated post D/C treatments: HH therapy 11. Overall Rehab/Functional Prognosis: excellent  RECOMMENDATIONS: This patient's condition is appropriate for continued rehabilitative care in the following setting: CIR Patient has agreed to participate in recommended program. Yes Note that insurance prior authorization may be required for reimbursement for recommended care.  Comment:   Erick Colace M.D. [facility] FAAPM&R (Sports Med, Neuromuscular Med) Diplomate Am Board of Electrodiagnostic Med  Charlton Amor., PA-C 02/18/2017    Revision History                        Routing History

## 2017-02-23 ENCOUNTER — Inpatient Hospital Stay (HOSPITAL_COMMUNITY): Payer: Self-pay | Admitting: Occupational Therapy

## 2017-02-23 ENCOUNTER — Inpatient Hospital Stay (HOSPITAL_COMMUNITY): Payer: Self-pay | Admitting: Physical Therapy

## 2017-02-23 ENCOUNTER — Inpatient Hospital Stay (HOSPITAL_COMMUNITY): Payer: Self-pay | Admitting: Speech Pathology

## 2017-02-23 DIAGNOSIS — I69398 Other sequelae of cerebral infarction: Secondary | ICD-10-CM

## 2017-02-23 DIAGNOSIS — R269 Unspecified abnormalities of gait and mobility: Secondary | ICD-10-CM

## 2017-02-23 DIAGNOSIS — I63512 Cerebral infarction due to unspecified occlusion or stenosis of left middle cerebral artery: Secondary | ICD-10-CM

## 2017-02-23 DIAGNOSIS — I69322 Dysarthria following cerebral infarction: Secondary | ICD-10-CM

## 2017-02-23 DIAGNOSIS — I69391 Dysphagia following cerebral infarction: Secondary | ICD-10-CM

## 2017-02-23 DIAGNOSIS — G8191 Hemiplegia, unspecified affecting right dominant side: Secondary | ICD-10-CM

## 2017-02-23 HISTORY — DX: Dysphagia following cerebral infarction: I69.391

## 2017-02-23 HISTORY — DX: Other sequelae of cerebral infarction: I69.398

## 2017-02-23 HISTORY — DX: Unspecified abnormalities of gait and mobility: R26.9

## 2017-02-23 LAB — COMPREHENSIVE METABOLIC PANEL
ALT: 81 U/L — ABNORMAL HIGH (ref 14–54)
ANION GAP: 6 (ref 5–15)
AST: 60 U/L — ABNORMAL HIGH (ref 15–41)
Albumin: 2.7 g/dL — ABNORMAL LOW (ref 3.5–5.0)
Alkaline Phosphatase: 70 U/L (ref 38–126)
BUN: 11 mg/dL (ref 6–20)
CHLORIDE: 106 mmol/L (ref 101–111)
CO2: 24 mmol/L (ref 22–32)
Calcium: 8.4 mg/dL — ABNORMAL LOW (ref 8.9–10.3)
Creatinine, Ser: 0.72 mg/dL (ref 0.44–1.00)
Glucose, Bld: 104 mg/dL — ABNORMAL HIGH (ref 65–99)
POTASSIUM: 3.9 mmol/L (ref 3.5–5.1)
SODIUM: 136 mmol/L (ref 135–145)
Total Bilirubin: 0.5 mg/dL (ref 0.3–1.2)
Total Protein: 6.2 g/dL — ABNORMAL LOW (ref 6.5–8.1)

## 2017-02-23 LAB — CBC WITH DIFFERENTIAL/PLATELET
BASOS ABS: 0 10*3/uL (ref 0.0–0.1)
Basophils Relative: 0 %
EOS PCT: 3 %
Eosinophils Absolute: 0.4 10*3/uL (ref 0.0–0.7)
HCT: 34.6 % — ABNORMAL LOW (ref 36.0–46.0)
Hemoglobin: 11.7 g/dL — ABNORMAL LOW (ref 12.0–15.0)
LYMPHS PCT: 15 %
Lymphs Abs: 2 10*3/uL (ref 0.7–4.0)
MCH: 31.5 pg (ref 26.0–34.0)
MCHC: 33.8 g/dL (ref 30.0–36.0)
MCV: 93 fL (ref 78.0–100.0)
MONOS PCT: 7 %
Monocytes Absolute: 0.9 10*3/uL (ref 0.1–1.0)
Neutro Abs: 9.8 10*3/uL — ABNORMAL HIGH (ref 1.7–7.7)
Neutrophils Relative %: 75 %
PLATELETS: 288 10*3/uL (ref 150–400)
RBC: 3.72 MIL/uL — AB (ref 3.87–5.11)
RDW: 13.3 % (ref 11.5–15.5)
WBC: 13.1 10*3/uL — AB (ref 4.0–10.5)

## 2017-02-23 LAB — URINALYSIS, ROUTINE W REFLEX MICROSCOPIC
Bilirubin Urine: NEGATIVE
GLUCOSE, UA: NEGATIVE mg/dL
KETONES UR: NEGATIVE mg/dL
LEUKOCYTES UA: NEGATIVE
NITRITE: NEGATIVE
PH: 6 (ref 5.0–8.0)
PROTEIN: 30 mg/dL — AB
Specific Gravity, Urine: 1.014 (ref 1.005–1.030)
Squamous Epithelial / LPF: NONE SEEN

## 2017-02-23 MED ORDER — POLYETHYLENE GLYCOL 3350 17 G PO PACK
17.0000 g | PACK | Freq: Two times a day (BID) | ORAL | Status: AC
  Administered 2017-02-23 (×2): 17 g via ORAL
  Filled 2017-02-23 (×2): qty 1

## 2017-02-23 MED ORDER — POLYETHYLENE GLYCOL 3350 17 G PO PACK: 17.0000 g | PACK | Freq: Every day | ORAL | Status: DC

## 2017-02-23 NOTE — Progress Notes (Signed)
No void for 8 hours.  PRN I/O cath and sent to lab to complete UA w/ C&S.  Urine contained frank blood , no odor at this time.

## 2017-02-23 NOTE — Progress Notes (Signed)
During VS pt noted to deSAT on RA.  Hands cold, added warm pack and rechecked SAT fluctuated between high 80s and low 90s.  Notified PA

## 2017-02-23 NOTE — Progress Notes (Signed)
Subjective/Complaints:  No issues overnite, except poor sleep . Less coughing per husband  ROS: no CP, SOB, N/V/D  Objective: Vital Signs: Blood pressure (!) 163/62, pulse (!) 53, temperature 98.5 F (36.9 C), temperature source Oral, resp. rate 16, weight 53.1 kg (117 lb), SpO2 96 %. No results found. Results for orders placed or performed during the hospital encounter of 02/22/17 (from the past 72 hour(s))  CBC WITH DIFFERENTIAL     Status: Abnormal   Collection Time: 02/23/17  5:56 AM  Result Value Ref Range   WBC 13.1 (H) 4.0 - 10.5 K/uL   RBC 3.72 (L) 3.87 - 5.11 MIL/uL   Hemoglobin 11.7 (L) 12.0 - 15.0 g/dL   HCT 34.6 (L) 36.0 - 46.0 %   MCV 93.0 78.0 - 100.0 fL   MCH 31.5 26.0 - 34.0 pg   MCHC 33.8 30.0 - 36.0 g/dL   RDW 13.3 11.5 - 15.5 %   Platelets 288 150 - 400 K/uL   Neutrophils Relative % 75 %   Lymphocytes Relative 15 %   Monocytes Relative 7 %   Eosinophils Relative 3 %   Basophils Relative 0 %   Neutro Abs 9.8 (H) 1.7 - 7.7 K/uL   Lymphs Abs 2.0 0.7 - 4.0 K/uL   Monocytes Absolute 0.9 0.1 - 1.0 K/uL   Eosinophils Absolute 0.4 0.0 - 0.7 K/uL   Basophils Absolute 0.0 0.0 - 0.1 K/uL   WBC Morphology MILD LEFT SHIFT (1-5% METAS, OCC MYELO, OCC BANDS)   Comprehensive metabolic panel     Status: Abnormal   Collection Time: 02/23/17  5:56 AM  Result Value Ref Range   Sodium 136 135 - 145 mmol/L   Potassium 3.9 3.5 - 5.1 mmol/L   Chloride 106 101 - 111 mmol/L   CO2 24 22 - 32 mmol/L   Glucose, Bld 104 (H) 65 - 99 mg/dL   BUN 11 6 - 20 mg/dL   Creatinine, Ser 0.72 0.44 - 1.00 mg/dL   Calcium 8.4 (L) 8.9 - 10.3 mg/dL   Total Protein 6.2 (L) 6.5 - 8.1 g/dL   Albumin 2.7 (L) 3.5 - 5.0 g/dL   AST 60 (H) 15 - 41 U/L   ALT 81 (H) 14 - 54 U/L   Alkaline Phosphatase 70 38 - 126 U/L   Total Bilirubin 0.5 0.3 - 1.2 mg/dL   GFR calc non Af Amer >60 >60 mL/min   GFR calc Af Amer >60 >60 mL/min    Comment: (NOTE) The eGFR has been calculated using the CKD EPI  equation. This calculation has not been validated in all clinical situations. eGFR's persistently <60 mL/min signify possible Chronic Kidney Disease.    Anion gap 6 5 - 15     HEENT: normal Cardio: RRR and no murmur Resp: CTA B/L and unlabored GI: BS positive and NT, ND Extremity:  Pulses positive and No Edema Skin:   Intact Neuro: Lethargic, Confused, Abnormal Sensory Reduced sensation to pinch RUE, Abnormal Motor trace delt, 2- bi, tri, trace finger ext, 2- flexion, 2- hip/kne ext R 0/5 at ankle, Abnormal FMC Ataxic/ dec FMC and Dysarthric Musc/Skel:  Swelling RIght dorsal hand edema Gen NAD   Assessment/Plan: 1. Functional deficits secondary to Left MCA infarct which require 3+ hours per day of interdisciplinary therapy in a comprehensive inpatient rehab setting. Physiatrist is providing close team supervision and 24 hour management of active medical problems listed below. Physiatrist and rehab team continue to assess barriers to discharge/monitor patient progress  toward functional and medical goals. FIM:                                  Medical Problem List and Plan: 1.  Right hemiparesis and language deficits secondary to left MCA infarct             -CIR PT OT, SLP evals today 2.  DVT Prophylaxis/Anticoagulation: Pharmaceutical: Xarelto and Plavix, high risk of bleeding, monitor 3. Pain Management: tylenol prn 4. Mood: LCSW to follow for evaluation and support. Sleep/wake cycle  5. Neuropsych: This patient is not capable of making decisions on her own behalf. 6. Skin/Wound Care: Routine pressure relief measures.  7. Fluids/Electrolytes/Nutrition: Monitor I/O. Check lytes in am. Offer supplements between meals if intake poor.  8. A fib: Monitor HR bid. Continue amiodarone and metoprolol for HR control.   9. Aspiration PNA: Continue Levaquin Day # 4. Continue to encourage IS. Aspiration precautions.  10 Leukocytosis: Reactive to PNA and resolving with  treatment. Increased to 13.1K afebrile , monitor  11. Hypokalemia: Improving with supplement--2.8-->2.8-->3.2. 3.9 on . Will continue to supplement for now. Recheck in am.  12. ABLA: Will recheck CBC in am. Monitor for signs of bleeding. Hgb mildly reduced at 11.7 no rx needed 13. Multiple Sclerosis: No medications? 14. H/o Depression: On Effexor and prozac.  46. H/o CAD s/p PTCA: On Lipitor  16. Lethargy: likely multifactorial due to altered sleep cycle,  Stroke, MS, pneumonia, sedative given last night             -check sleep chart             -limit neuro-sedating meds             -labs ordered for tomorrow             -trazodone prn tonight for insomnia   LOS (Days) 1 A FACE TO FACE EVALUATION WAS PERFORMED  KIRSTEINS,ANDREW E 02/23/2017, 9:09 AM

## 2017-02-23 NOTE — Progress Notes (Signed)
Patient safety maintained. No needs/concerns voiced at this time. 

## 2017-02-23 NOTE — Progress Notes (Signed)
SLP Cancellation Note  Patient Details Name: Kaitlin [name] MRN: [id number] DOB: 1945-10-07   Cancelled treatment:        Attempted to see pt for ST evaluation.  Pt somnolent upon therapist's arrival.  Could not keep her eyes open for more than 30 seconds to focus attention on therapist. Environmental modifications and multisensory stimulation were ineffective for improving alertness.  Pt recently had completed shower with OT and had been up in wheelchair since 9 AM.  Pt returned back to bed with assistance from RN.  Will plan to complete ST evaluation at next available appointment.                                                                                                  Keisa Blow, Melanee Spry 02/23/2017, 3:13 PM

## 2017-02-23 NOTE — Progress Notes (Signed)
Patient information reviewed and entered into eRehab system by Tesla Keeler, RN, CRRN, PPS Coordinator.  Information including medical coding and functional independence measure will be reviewed and updated through discharge.     Per nursing patient was given "Data Collection Information Summary for Patients in Inpatient Rehabilitation Facilities with attached "Privacy Act Statement-Health Care Records" upon admission.  

## 2017-02-23 NOTE — Progress Notes (Signed)
Orthopedic Tech Progress Note Patient Details:  Kaitlin [name] 08/05/1945 [id number]  Patient ID: [name], female   DOB: [birth year], 71 y.o.   MRN: [id number]   Nikki Dom 02/23/2017, 9:50 AM Called in bio-tech brace order; spoke with Wylene Men

## 2017-02-23 NOTE — Evaluation (Signed)
Occupational Therapy Assessment and Plan  Patient Details  Name: Kaitlin [name] MRN: [id number] Date of Birth: 1946-02-01  OT Diagnosis: abnormal posture, apraxia, ataxia, cognitive deficits, hemiplegia affecting non-dominant side and muscle weakness (generalized) Rehab Potential: Rehab Potential (ACUTE ONLY): Good ELOS: 3-3.5 weeks   Today's Date: 02/23/2017 OT Individual Time: 1300-1400 OT Individual Time Calculation (min): 60 min     Problem List:  Patient Active Problem List   Diagnosis Date Noted  . Abnormality of gait as late effect of stroke 02/23/2017  . Dysarthria as late effect of stroke 02/23/2017  . Dysphagia as late effect of stroke 02/23/2017  . Acute ischemic left middle cerebral artery (MCA) stroke ([place]) 02/22/2017  . Paroxysmal A-fib ([place]) 02/17/2017  . Atrial fibrillation with RVR ([place]) 02/17/2017  . HLD (hyperlipidemia) 02/17/2017  . Acute hypoxemic respiratory failure ([place])   . Stroke (cerebrum) ([facility]) - Acute MCA infarct due to left CCA/ICA and MCA occlusion, s/p mechanical thrombectomy and left ICA stenting in the setting of newly diagnosed afib 02/14/2017    Past Medical History:  Past Medical History:  Diagnosis Date  . CAD S/P percutaneous coronary angioplasty 2001  . CVA (cerebral vascular accident) ([place]) 01/2017  . HCVD (hypertensive cardiovascular disease) 01/2017   normal LVF with moderate LVH, grade 1 DD  . High cholesterol   . Multiple sclerosis ([place])   . PAF (paroxysmal atrial fibrillation) ([place]) 01/2017   Past Surgical History:  Past Surgical History:  Procedure Laterality Date  . CORONARY ANGIOPLASTY WITH STENT PLACEMENT  11/1999   RCA DES-[facility]  . IR ANGIO INTRA EXTRACRAN SEL COM CAROTID INNOMINATE UNI R MOD SED  02/15/2017  . IR ANGIO VERTEBRAL SEL SUBCLAVIAN INNOMINATE UNI R MOD SED  02/15/2017  . IR INTRAVSC STENT CERV CAROTID W/O EMB-PROT MOD SED INC ANGIO  02/15/2017  . IR PERCUTANEOUS ART THROMBECTOMY/INFUSION INTRACRANIAL INC DIAG  ANGIO  02/15/2017  . NECK SURGERY    . RADIOLOGY WITH ANESTHESIA N/A 02/14/2017   Procedure: RADIOLOGY WITH ANESTHESIA;  Surgeon: Radiologist, Medication, MD;  Location: [facility];  Service: Radiology;  Laterality: N/A;  . WRIST SURGERY      Assessment & Plan Clinical Impression: Kaitlin [name] a 70 y.o.female with history of CAD s/p PTCA, MS with gait disorder/memory loss, PAF who was admitted on 02/14/17 with reports of speech difficulty progressing to right sided weakness later that day. CTA head showed evidence of infarct left insular and left posterior putamen with occlusion of L-CCA and L-ICA from arch to distal cavernous segment and incidental finding of centrilobar emphysema in lung apices. She underwent cerebral angio with complete revascularization of occluded L-MCA with one pass Solitaire and stent assisted angioplasty of proximal L-ICA with IA integrelin. Follow up MRI brain done revealing acute confluent infarct involving the left putamen, caudate body, and corona radiata, 2 tiny cortical infarcts in the left MCA distribution and extensive white matter disease due to MS and chronic microvascular ischemia. She tolerated extubation on 7/18 but was found to develop A fib with RVR requiring amiodarone and esmolol. Dr. Meda Coffee recommended Eliquis bid for A fib (as likely cause of stroke) and changing Brilinta to Plavix.   BLE dopplers negative for DVT. 2 D echo revealed EF 65-70/5 with grade 1 diastolic dysfunction, mild aortic stenosis, mildly thickened MV and no wall abnormality. She was started on dysphagia 2, thin liquids due to mild oropharyngeal dysphagia with stasis. She developed respiratory distress requiring NRB on 7/20 and CXR revealed airspace disease RUL/RLL felt to be  due to aspiration.  Patient transferred to CIR on 02/22/2017 .    Patient currently requires max with basic self-care skills secondary to muscle weakness, decreased cardiorespiratoy endurance, unbalanced muscle  activation, motor apraxia, ataxia, decreased coordination and decreased motor planning, decreased attention to right and decreased motor planning, decreased attention, decreased awareness, decreased problem solving, decreased safety awareness, decreased memory and delayed processing and decreased sitting balance, decreased standing balance, decreased postural control, hemiplegia and decreased balance strategies.  Prior to hospitalization, patient could complete ADLs/IADLs with independent .  Patient will benefit from skilled intervention to decrease level of assist with basic self-care skills and increase independence with basic self-care skills prior to discharge home with care partner.  Anticipate patient will require minimal physical assistance and follow up home health.  OT - End of Session Activity Tolerance: Tolerates 10 - 20 min activity with multiple rests Endurance Deficit: Yes OT Assessment Rehab Potential (ACUTE ONLY): Good OT Patient demonstrates impairments in the following area(s): Balance;Cognition;Endurance;Safety;Perception;Pain;Motor;Sensory OT Basic ADL's Functional Problem(s): Eating;Grooming;Bathing;Dressing;Toileting OT Transfers Functional Problem(s): Toilet;Tub/Shower OT Additional Impairment(s): Fuctional Use of Upper Extremity OT Plan OT Intensity: Minimum of 1-2 x/day, 45 to 90 minutes OT Frequency: 5 out of 7 days OT Duration/Estimated Length of Stay: 3-3.5 weeks OT Treatment/Interventions: Balance/vestibular training;Cognitive remediation/compensation;Community reintegration;Discharge planning;Disease mangement/prevention;DME/adaptive equipment instruction;Functional electrical stimulation;Functional mobility training;Neuromuscular re-education;Pain management;Patient/family education;Psychosocial support;Self Care/advanced ADL retraining;Skin care/wound managment;Therapeutic Activities;Therapeutic Exercise;UE/LE Strength taining/ROM;UE/LE Coordination activities OT  Self Feeding Anticipated Outcome(s): Supervision/ set-up OT Basic Self-Care Anticipated Outcome(s): Min A OT Toileting Anticipated Outcome(s): Min A OT Bathroom Transfers Anticipated Outcome(s): Min A OT Recommendation Patient destination: Home Follow Up Recommendations: Home health OT Equipment Recommended: To be determined   Skilled Therapeutic Intervention Pt seen for OT eval and ADL bathing/dressing session. Pt sitting up in w/c upon arrival with husband present. Pt denying pain and agreeable to tx session. Completed max A stand pivot transfers throughout session with assist to advance R LE. Pt very impulsive with mobility with decreased insight into deficits and poor safety awareness. She bathed seated in shower on BSC. Required multimodal cuing to attend to wash R side of body though did initiate attempt to use R UE to bathe, requiring hand over hand assist to use in functional manner.  She dressed seated on toilet, unable to follow commands for hemi dressing technique, mod-max A standing balance to pull pants up. Pt with increased fatigue at end of session, unable to keep eyes open to don socks. Pt left asleep in w/c at end of session with lap tray and QRB donned. Pt with SLP session in 30 minutes and therefore left pt seated in chair. Noted cut to pt's L heel. RN made aware and bandage applied.  Pt's husband educated throughout session regarding role of OT, POC, OT goals, IPR, DME, and d/c planning.   OT Evaluation Precautions/Restrictions  Precautions Precautions: Fall Precaution Comments: R side inattention; impulsive Restrictions Weight Bearing Restrictions: No General Chart Reviewed: Yes Additional Pertinent History: dx of MS Pain   No/denies pain Home Living/Prior Functioning Home Living Available Help at Discharge: Family, Available 24 hours/day Type of Home: House Home Access: Stairs to enter [employer] of Steps: 4 Entrance Stairs-Rails: Right Home  Layout: Multi-level Alternate Level Stairs-Number of Steps: Bedroom on second level Bathroom Shower/Tub: Tub only, Multimedia programmer: Associate Professor Accessibility: Yes  Lives With: Spouse IADL History Occupation: Retired Type of Occupation: Retired Automotive engineer Prior Function Level of Independence: Independent with basic ADLs, Independent with homemaking  with ambulation, Independent with gait, Independent with transfers  Able to Take Stairs?: Yes Driving: Yes Vocation: Retired Comments: Pt gardening the morning she was admitted.  Vision Baseline Vision/History: Wears glasses Wears Glasses: At all times Patient Visual Report: No change from baseline;Other (comment) (Pt reports no change in vision, will need to assess in functional context) Vision Assessment?: Yes Ocular Range of Motion: Restricted on the right Tracking/Visual Pursuits: Decreased smoothness of horizontal tracking Visual Fields: Right visual field deficit Perception  Inattention/Neglect: Does not attend to right visual field;Does not attend to right side of body Praxis Praxis: Impaired Praxis Impairment Details: Perseveration Cognition Overall Cognitive Status: Impaired/Different from baseline Arousal/Alertness: Lethargic Orientation Level:  (Not oriented to place or situaiton) Year:  (Unable to complete BIMS due to lethargy at end of session) Memory: Impaired Memory Impairment: Decreased recall of new information;Decreased short term memory;Decreased long term memory Decreased Long Term Memory: Verbal basic;Functional basic Decreased Short Term Memory: Verbal basic;Functional basic Attention: Sustained Sustained Attention: Impaired Sustained Attention Impairment: Verbal basic;Functional basic Awareness: Impaired Awareness Impairment:  ( ) Problem Solving: Impaired Problem Solving Impairment: Verbal basic;Functional basic Executive Function:  (All impaired due to lower level  deficits) Behaviors: Impulsive Safety/Judgment: Impaired Comments: decreaced awareness of deficits  Sensation Sensation Light Touch: Impaired Detail Light Touch Impaired Details: Impaired RLE;Impaired RUE Proprioception: Impaired Detail Proprioception Impaired Details: Impaired RUE;Impaired RLE Coordination Gross Motor Movements are Fluid and Coordinated: No Fine Motor Movements are Fluid and Coordinated: No Coordination and Movement Description: R hemiplegia Finger Nose Finger Test: WFL L UE, unable to complete on R Motor  Motor Motor: Hemiplegia Motor - Skilled Clinical Observations: RLE and RUE hemiplegia   Trunk/Postural Assessment  Cervical Assessment Cervical Assessment: Exceptions to [facility] (Forward head) Thoracic Assessment Thoracic Assessment: Exceptions to [facility] (Kyphotic) Lumbar Assessment Lumbar Assessment: Exceptions to [facility] (Posterior pelvic tilt) Postural Control Postural Control: Deficits on evaluation (R posterior lean)  Balance Balance Balance Assessed: Yes Static Sitting Balance Static Sitting - Balance Support: Feet supported;Right upper extremity supported Static Sitting - Level of Assistance: 5: Stand by assistance Static Sitting - Comment/# of Minutes: Sitting on BSC Dynamic Sitting Balance Dynamic Sitting - Balance Support: During functional activity;Feet unsupported;Right upper extremity supported Dynamic Sitting - Level of Assistance: 4: Min assist;3: Mod assist Sitting balance - Comments: Sitting on BSC while showering Static Standing Balance Static Standing - Balance Support: During functional activity Static Standing - Level of Assistance: 2: Max assist Static Standing - Comment/# of Minutes: Standing for LB dressing/toileting tasks Dynamic Standing Balance Dynamic Standing - Balance Support: During functional activity Dynamic Standing - Level of Assistance: 2: Max assist Dynamic Standing - Comments: Standing for LB dressing/toileting  tasks Extremity/Trunk Assessment RUE Assessment RUE Assessment: Exceptions to [facility] (Stage 3 Brunnstrum; attempted to use in functional manner. Unable to formally assess due to cognition) LUE Assessment LUE Assessment: Within Functional Limits   See Function Navigator for Current Functional Status.   Refer to Care Plan for Long Term Goals  Recommendations for other services: None    Discharge Criteria: Patient will be discharged from OT if patient refuses treatment 3 consecutive times without medical reason, if treatment goals not met, if there is a change in medical status, if patient makes no progress towards goals or if patient is discharged from hospital.  The above assessment, treatment plan, treatment alternatives and goals were discussed and mutually agreed upon: by patient and by family  Ernestina Patches 02/23/2017, 3:45 PM

## 2017-02-23 NOTE — Evaluation (Signed)
Physical Therapy Assessment and Plan  Patient Details  Name: Kaitlin [name] MRN: [id number] Date of Birth: 03-03-1946  PT Diagnosis: Abnormal posture, Abnormality of gait, Hemiplegia non-dominant, Impaired cognition, Impaired sensation and Muscle weakness Rehab Potential: Good ELOS: 3-3.5 weeks    Today's Date: 02/23/2017 PT Individual Time: 0802-0915 PT Individual Time Calculation (min): 73 min    Problem List:  Patient Active Problem List   Diagnosis Date Noted  . Acute ischemic left middle cerebral artery (MCA) stroke ([place]) 02/22/2017  . Paroxysmal A-fib ([place]) 02/17/2017  . Atrial fibrillation with RVR ([place]) 02/17/2017  . HLD (hyperlipidemia) 02/17/2017  . Acute hypoxemic respiratory failure ([place])   . Stroke (cerebrum) ([facility]) - Acute MCA infarct due to left CCA/ICA and MCA occlusion, s/p mechanical thrombectomy and left ICA stenting in the setting of newly diagnosed afib 02/14/2017    Past Medical History:  Past Medical History:  Diagnosis Date  . CAD S/P percutaneous coronary angioplasty 2001  . CVA (cerebral vascular accident) ([place]) 01/2017  . HCVD (hypertensive cardiovascular disease) 01/2017   normal LVF with moderate LVH, grade 1 DD  . High cholesterol   . Multiple sclerosis ([place])   . PAF (paroxysmal atrial fibrillation) ([place]) 01/2017   Past Surgical History:  Past Surgical History:  Procedure Laterality Date  . CORONARY ANGIOPLASTY WITH STENT PLACEMENT  11/1999   RCA DES-[facility]  . IR ANGIO INTRA EXTRACRAN SEL COM CAROTID INNOMINATE UNI R MOD SED  02/15/2017  . IR ANGIO VERTEBRAL SEL SUBCLAVIAN INNOMINATE UNI R MOD SED  02/15/2017  . IR INTRAVSC STENT CERV CAROTID W/O EMB-PROT MOD SED INC ANGIO  02/15/2017  . IR PERCUTANEOUS ART THROMBECTOMY/INFUSION INTRACRANIAL INC DIAG ANGIO  02/15/2017  . NECK SURGERY    . RADIOLOGY WITH ANESTHESIA N/A 02/14/2017   Procedure: RADIOLOGY WITH ANESTHESIA;  Surgeon: Radiologist, Medication, MD;  Location: [place];  Service: Radiology;   Laterality: N/A;  . WRIST SURGERY      Assessment & Plan Clinical Impression: Patient is a 71 y.o.female with history of CAD s/p PTCA, MS with gait disorder/memory loss, PAF who was admitted on 02/14/17 with reports of speech difficulty progressing to right sided weakness later that day. CTA head showed evidence of infarct left insular and left posterior putamen with occlusion of L-CCA and L-ICA from arch to distal cavernous segment and incidental finding of centrilobar emphysema in lung apices. She underwent cerebral angio with complete revascularization of occluded L-MCA with one pass Solitaire and stent assisted angioplasty of proximal L-ICA with IA integrelin. Follow up MRI brain done revealing acute confluent infarct involving the left putamen, caudate body, and corona radiata, 2 tiny cortical infarcts in the left MCA distribution and extensive white matter disease due to MS and chronic microvascular ischemia. She tolerated extubation on 7/18 but was found to develop A fib with RVR requiring amiodarone and esmolol. Dr. Meda Coffee recommended Eliquis bid for A fib (as likely cause of stroke) and changing Brilinta to Plavix.   BLE dopplers negative for DVT. 2 D echo revealed EF 65-70/5 with grade 1 diastolic dysfunction, mild aortic stenosis, mildly thickened MV and no wall abnormality. She was started on dysphagia 2, thin liquids due to mild oropharyngeal dysphagia with stasis. She developed respiratory distress requiring NRB on 7/20 and CXR revealed airspace disease RUL/RLL felt to be due to aspiration  Patient transferred to [place] [facility] on 02/22/2017 .   Patient currently requires max with mobility secondary to muscle weakness and muscle joint tightness, impaired timing and sequencing, abnormal tone,  unbalanced muscle activation and decreased motor planning, field cut, decreased attention to right and right side neglect, decreased attention, decreased awareness, decreased problem solving, decreased safety  awareness, decreased memory and delayed processing and decreased standing balance, decreased postural control, hemiplegia and decreased balance strategies.  Prior to hospitalization, patient was independent  with mobility and lived with   in a House home.  Home access is 4Stairs to enter (has second entrance to house with 1 step. ).  Patient will benefit from skilled PT intervention to maximize safe functional mobility, minimize fall risk and decrease caregiver burden for planned discharge home with 24 hour assist.  Anticipate patient will benefit from follow up [facility] at discharge.     Skilled Therapeutic Intervention PT instructed patient in PT Evaluation and initiated treatment intervention; see below for results. PT educated patient in [place], rehab potential, rehab goals, and discharge recommendations. Pt note to incontinent of bladder and performed toilet transfer with max assist and rail in bathroom. PT required to perform clothing management. WC mobility performed with Hemi technique; poor use of the LLE to control steering. Patient returned too room and left sitting in [facility] with call bell in reach and all needs met.      PT Evaluation Precautions/Restrictions Precautions Precautions: Fall Precaution Comments: R side inattention; impulsive Restrictions Weight Bearing Restrictions: No General   Vital Signs Pain   none.  Home Living/Prior Functioning Home Living Available Help at Discharge: Family;Available 24 hours/day Type of Home: House Home Access: Stairs to enter (has second entrance to house with 1 step. ) Entrance Stairs-Number of Steps: 4 Entrance Stairs-Rails: Right Home Layout: Multi-level Alternate Level Stairs-Number of Steps: 1 (to bedroom; none to bathroom) Bathroom Shower/Tub: Tub only;Walk-in shower Bathroom Toilet: Standard Bathroom Accessibility: Yes Prior Function Level of Independence: Independent with basic ADLs;Independent with homemaking with ambulation  Able  to Take Stairs?: Yes Driving: Yes Vocation: Retired Comments: Pt gardening the morning she was admitted. Likes to hike and  Vision/Perception  Vision - Assessment Ocular Range of Motion: Restricted on the right Tracking/Visual Pursuits: Decreased smoothness of horizontal tracking Perception Perception: Impaired Inattention/Neglect: Does not attend to right visual field;Does not attend to right side of body Praxis Praxis: Impaired Praxis Impairment Details: Perseveration  Cognition Overall Cognitive Status: Impaired/Different from baseline Arousal/Alertness: Lethargic Orientation Level: Oriented to person;Disoriented to time;Disoriented to place;Disoriented to situation Attention: Sustained Sustained Attention: Impaired Memory: Impaired Memory Impairment: Decreased recall of new information Awareness: Impaired Awareness Impairment: Intellectual impairment;Emergent impairment;Anticipatory impairment Problem Solving: Impaired Problem Solving Impairment: Verbal basic;Functional basic Behaviors: Perseveration Safety/Judgment: Impaired Comments: decreaced awareness of deficits  Sensation Sensation Light Touch: Impaired Detail Light Touch Impaired Details: Impaired RLE;Impaired RUE Proprioception: Impaired Detail Proprioception Impaired Details: Impaired RUE;Impaired RLE Coordination Gross Motor Movements are Fluid and Coordinated: No Fine Motor Movements are Fluid and Coordinated: No Coordination and Movement Description: decreased coordination  Motor  Motor Motor: Hemiplegia Motor - Skilled Clinical Observations: RLE anf RUE hemiplegia   Mobility Bed Mobility Bed Mobility: Rolling Right;Rolling Left;Sit to Supine;Supine to Sit Rolling Right: 3: Mod assist Rolling Right Details: Verbal cues for precautions/safety;Verbal cues for technique;Verbal cues for sequencing;Manual facilitation for placement Rolling Left: 3: Mod assist Rolling Left Details: Verbal cues for  technique;Verbal cues for precautions/safety;Manual facilitation for placement;Verbal cues for sequencing;Verbal cues for safe use of DME/AE Supine to Sit: 3: Mod assist Supine to Sit Details: Verbal cues for precautions/safety;Verbal cues for technique;Verbal cues for safe use of DME/AE;Verbal cues for sequencing;Manual facilitation for placement Sit to  Supine: 3: Mod assist Sit to Supine - Details: Verbal cues for precautions/safety;Manual facilitation for placement;Verbal cues for sequencing;Verbal cues for technique;Verbal cues for safe use of DME/AE Transfers Transfers: Yes Sit to Stand: 2: Max assist Sit to Stand Details: Verbal cues for sequencing;Verbal cues for technique;Manual facilitation for placement;Verbal cues for precautions/safety Stand Pivot Transfers: 2: Max assist Stand Pivot Transfer Details: Verbal cues for sequencing;Verbal cues for technique;Manual facilitation for weight shifting;Verbal cues for safe use of DME/AE;Verbal cues for precautions/safety Squat Pivot Transfers: 2: Max assist Squat Pivot Transfer Details: Verbal cues for technique;Verbal cues for precautions/safety;Manual facilitation for placement;Verbal cues for safe use of DME/AE;Verbal cues for sequencing Locomotion  Ambulation Ambulation: Yes Ambulation/Gait Assistance: 2: Max assist Assistive device: 1 person hand held assist;None Gait Gait: Yes Gait Pattern: Decreased stance time - right;Decreased step length - right;Right steppage;Right flexed knee in stance Stairs / Additional Locomotion Stairs: Yes Stairs Assistance: 2: Max Industrial/product designer Assistance Details: Verbal cues for safe use of DME/AE;Verbal cues for precautions/safety Stair Management Technique: One rail Left Height of Stairs: 4 Wheelchair Mobility Wheelchair Mobility: Yes Wheelchair Assistance: 3: Mod assist;2: Max Technical sales engineer Details: Verbal cues for precautions/safety;Verbal cues for safe use of DME/AE;Manual  facilitation for placement;Manual facilitation for weight shifting;Verbal cues for technique Wheelchair Propulsion: Left lower extremity;Left upper extremity Wheelchair Parts Management: Needs assistance  Trunk/Postural Assessment  Cervical Assessment Cervical Assessment: Exceptions to [facility] (Forward head) Thoracic Assessment Thoracic Assessment: Exceptions to [facility] (Kyphotic) Lumbar Assessment Lumbar Assessment: Exceptions to [facility] (Posterior pelvic tilt) Postural Control Postural Control: Deficits on evaluation (R posterior lean)  Balance Balance Balance Assessed: Yes Static Sitting Balance Static Sitting - Balance Support: Feet supported;Right upper extremity supported Static Sitting - Level of Assistance: 5: Stand by assistance Static Sitting - Comment/# of Minutes: Sitting on BSC Dynamic Sitting Balance Dynamic Sitting - Balance Support: During functional activity;Feet unsupported;Right upper extremity supported Dynamic Sitting - Level of Assistance: 4: Min assist Dynamic Sitting - Balance Activities: Forward lean/weight shifting;Lateral lean/weight shifting Sitting balance - Comments: Sitting on BSC while showering Static Standing Balance Static Standing - Balance Support: During functional activity Static Standing - Level of Assistance: 2: Max assist Static Standing - Comment/# of Minutes: Standing for LB dressing/toileting tasks Dynamic Standing Balance Dynamic Standing - Balance Support: During functional activity Dynamic Standing - Level of Assistance: 2: Max assist Dynamic Standing - Comments: Standing for LB dressing/toileting tasks Extremity Assessment      RLE Assessment RLE Assessment: Exceptions to [facility] RLE AROM (degrees) RLE Overall AROM Comments: decreased knee flexion and hip flexion. absent ankle DF.  RLE PROM (degrees) RLE Overall PROM Comments: ~ lacking 10 degrees of neutral into DF.  RLE Strength RLE Overall Strength Comments: 3+/5 proximal to distal except  knee extension 4-/5.  ankle DF 0/5 LLE Assessment LLE Assessment: Within Functional Limits (grossly 4/5 proximal to distal. )   See Function Navigator for Current Functional Status.   Refer to Care Plan for Long Term Goals  Recommendations for other services: None   Discharge Criteria: Patient will be discharged from PT if patient refuses treatment 3 consecutive times without medical reason, if treatment goals not met, if there is a change in medical status, if patient makes no progress towards goals or if patient is discharged from hospital.  The above assessment, treatment plan, treatment alternatives and goals were discussed and mutually agreed upon: by patient and by family  Lorie Phenix 02/23/2017, 9:25 AM

## 2017-02-24 ENCOUNTER — Inpatient Hospital Stay (HOSPITAL_COMMUNITY): Payer: Self-pay | Admitting: Occupational Therapy

## 2017-02-24 ENCOUNTER — Inpatient Hospital Stay (HOSPITAL_COMMUNITY): Payer: Medicare Other | Admitting: Speech Pathology

## 2017-02-24 ENCOUNTER — Inpatient Hospital Stay (HOSPITAL_COMMUNITY): Payer: Medicare Other

## 2017-02-24 ENCOUNTER — Inpatient Hospital Stay (HOSPITAL_COMMUNITY): Payer: Self-pay | Admitting: Physical Therapy

## 2017-02-24 DIAGNOSIS — I69398 Other sequelae of cerebral infarction: Secondary | ICD-10-CM

## 2017-02-24 DIAGNOSIS — R269 Unspecified abnormalities of gait and mobility: Secondary | ICD-10-CM

## 2017-02-24 NOTE — Progress Notes (Signed)
Physical Therapy Session Note  Patient Details  Name: Kaitlin [name] MRN: [id number] Date of Birth: 11/17/1945  Today's Date: 02/24/2017 PT Individual Time: 0901-1015 PT Individual Time Calculation (min): 74 min   Short Term Goals: Week 1:  PT Short Term Goal 1 (Week 1): Pt will perform bed mobility with min assist 75% of trials PT Short Term Goal 2 (Week 1): Pt will perform squat pivot transfer with mod assist  PT Short Term Goal 3 (Week 1): Pt will ambulate 73f with mod assist and LRAD  PT Short Term Goal 4 (Week 1): Pt will propell WC 1024fwith min assist   Skilled Therapeutic Interventions/Progress Updates:   Pt received supine in bed and agreeable to PT. Supine>sit transfer with mod assist and max cues for posture.   Squat pivot transfer with max assist to the L and Mod assist to the R. Max cues to proper UE placement and sequencing.   Sit<>stand with mod assist from PT due to poor use of the RLE and RUE.   Standing tolerance x 1 min with cues for midline orientation. Pt able to correct with BUE support on RW.   Gait training x 1279fith max assist and RW. Pt noted to have increased pushing tendencies to the R with LUE and LLE with increased distance and fatigue.   Sitting balance and midline orientation with lateral and cross body reaches to the L to touch various targets. . Pt noted to have significant difficulty weight shifting beyond midline with pushing tendencies to the R with LLE and trunk.   PT fit pt for 16x16 WC for improved trunk support and improve midline orientation while sitting in WC.   Patient returned too room and left sitting in WC [facility] call bell in reach and all needs met.           Therapy Documentation Precautions:  Precautions Precautions: Fall Precaution Comments: R side inattention; impulsive Restrictions Weight Bearing Restrictions: No   Pain: Pain Assessment Pain Assessment: No/denies pain   See Function Navigator for Current  Functional Status.   Therapy/Group: Individual Therapy  AusLorie Phenix26/2018, 10:20 AM

## 2017-02-24 NOTE — Progress Notes (Signed)
Patient rested quietly throughout HS.  Patient safety maintained and no needs/concerns voiced at this time.

## 2017-02-24 NOTE — Evaluation (Signed)
Speech Language Pathology Assessment and Plan  Patient Details  Name: Kaitlin [name] MRN: [id number] Date of Birth: 05/10/1946  SLP Diagnosis: Aphasia;Dysarthria;Cognitive Impairments;Speech and Language deficits;Dysphagia;Apraxia  Rehab Potential: Fair ELOS: 3 weeks    Today's Date: 02/24/2017 SLP Individual Time: 1100-1200 SLP Individual Time Calculation (min): 60 min   Problem List:  Patient Active Problem List   Diagnosis Date Noted  . Abnormality of gait as late effect of stroke 02/23/2017  . Dysarthria as late effect of stroke 02/23/2017  . Dysphagia as late effect of stroke 02/23/2017  . Acute ischemic left middle cerebral artery (MCA) stroke ([facility]) 02/22/2017  . Paroxysmal A-fib ([facility]) 02/17/2017  . Atrial fibrillation with RVR ([facility]) 02/17/2017  . HLD (hyperlipidemia) 02/17/2017  . Acute hypoxemic respiratory failure ([facility])   . Stroke (cerebrum) ([facility]) - Acute MCA infarct due to left CCA/ICA and MCA occlusion, s/p mechanical thrombectomy and left ICA stenting in the setting of newly diagnosed afib 02/14/2017   Past Medical History:  Past Medical History:  Diagnosis Date  . CAD S/P percutaneous coronary angioplasty 2001  . CVA (cerebral vascular accident) ([facility]) 01/2017  . HCVD (hypertensive cardiovascular disease) 01/2017   normal LVF with moderate LVH, grade 1 DD  . High cholesterol   . Multiple sclerosis ([facility])   . PAF (paroxysmal atrial fibrillation) ([facility]) 01/2017   Past Surgical History:  Past Surgical History:  Procedure Laterality Date  . CORONARY ANGIOPLASTY WITH STENT PLACEMENT  11/1999   [facility]CA DES-[facility]  . IR ANGIO INTRA EXTRACRAN SEL COM CAROTID INNOMINATE UNI R MOD SED  02/15/2017  . IR ANGIO VERTEBRAL SEL SUBCLAVIAN INNOMINATE UNI R MOD SED  02/15/2017  . IR INTRAVSC STENT CERV CAROTID W/O EMB-PROT MOD SED INC ANGIO  02/15/2017  . IR PERCUTANEOUS ART THROMBECTOMY/INFUSION INTRACRANIAL INC DIAG ANGIO  02/15/2017  . NECK SURGERY    . RADIOLOGY WITH ANESTHESIA  N/A 02/14/2017   Procedure: RADIOLOGY WITH ANESTHESIA;  Surgeon: Radiologist, Medication, MD;  Location: [facility];  Service: Radiology;  Laterality: N/A;  . WRIST SURGERY      Assessment / Plan / Recommendation Clinical Impression Kaitlin [name] a 71 y.o.female with history of CAD s/p PTCA, MS with gait disorder/memory loss, PAF who was admitted on 02/14/17 with reports of speech difficulty progressing to right sided weakness later that day. CTA head showed evidence of infarct left insular and left posterior putamen with occlusion of L-CCA and L-ICA from arch to distal cavernous segment and incidental finding of centrilobar emphysema in lung apices. She underwent cerebral angio with complete revascularization of occluded L-MCA with one pass Solitaire and stent assisted angioplasty of proximal L-ICA with IA integrelin. Follow up MRI brain done revealing acute confluent infarct involving the left putamen, caudate body, and corona radiata, 2 tiny cortical infarcts in the left MCA distribution and extensive white matter disease due to MS and chronic microvascular ischemia. She tolerated extubation on 7/18. She was started on dysphagia 2, thin liquids due to mild oropharyngeal dysphagia with stasis. She developed respiratory distress requiring NRB on 7/20 and CXR revealed airspace disease RUL/RLL felt to be due to aspiration.   She was admited to CIR on 02/22/17. Speech-language evaluation attempted on 02/23/17 but unable to complete d/t pt lethergy. Bedside swallow and speech-language evaluations successfully completed on 02/24/17. Pt presents with moderate oral phase dysphagia c/b decreased attention and management of bolus resulting in dysphagia 2. Pt with flash laryngeal penetration of thin liquids via straw, therefore strict adherence to NO STRAWS is impairative with   thin liquids. Pt also demonstrates Moderate to severe expressive > receptive aphasia c/b sentence length disfluent utterances comprised of  neologisms and semantic paraphasias. Pt's aphasia also impacts for ability to read and write. Cognitive assessment will continue but pt appears to have moderate to severe cognitive deficits with deficits in orientation, recall of any information, alertness, sustained attention, task initiation and basic problem solving that are further complication by 4 year history of STM deficits. In addition to the above mentioned deficits, pt's speech communication is c/b moderate dysarthria with right facial flaccidity decreased intelligibility at the word-phrase level during known words d/t decreased vocal intensity and imprecise articulation. Skilled ST is required to address the above mentioned deficits to increase functional independence and reduce caregiver burden. Anticipate that pt will require 24 hour supervision and follow-up HHST at discharge.    Skilled Therapeutic Interventions          Skilled treatment session focused on completed of bedside swallow evaluation and speech-language evaluations, see above. Extensive education provided to husband on areas of deficits, impact on function, goals and estimated POC. All questions answered to pt and husband's satisfaction.    SLP Assessment  Patient will need skilled Speech Lanaguage Pathology Services during CIR admission    Recommendations  SLP Diet Recommendations: Dysphagia 2 (Fine chop);Thin Liquid Administration via: Cup Medication Administration: Whole meds with puree Supervision: Staff to assist with self feeding;Full supervision/cueing for compensatory strategies Compensations: Slow rate;Small sips/bites;Minimize environmental distractions;Multiple dry swallows after each bite/sip Postural Changes and/or Swallow Maneuvers: Seated upright 90 degrees Oral Care Recommendations: Oral care BID Patient destination: Home Follow up Recommendations: Home Health SLP;Outpatient SLP;24 hour supervision/assistance;Skilled Nursing facility Equipment  Recommended: None recommended by SLP    SLP Frequency 3 to 5 out of 7 days   SLP Duration  SLP Intensity  SLP Treatment/Interventions 3 weeks  Minumum of 1-2 x/day, 30 to 90 minutes  Cognitive remediation/compensation;Cueing hierarchy;DME/adaptive equipment instruction;Dysphagia/aspiration precaution training;Environmental controls;Functional tasks;Internal/external aids;Patient/family education;Therapeutic Activities;Speech/Language facilitation    Pain    Prior Functioning Cognitive/Linguistic Baseline: Within functional limits Type of Home: House  Lives With: Spouse Available Help at Discharge: Family;Available 24 hours/day Vocation: Retired  Function:  Eating Eating   Modified Consistency Diet: Yes Eating Assist Level: Set up assist for;Help with picking up utensils;Help managing cup/glass;Helper feeds patient   Eating Set Up Assist For: Opening containers       Cognition Comprehension Comprehension assist level: Understands basic less than 25% of the time/ requires cueing >75% of the time  Expression   Expression assist level: Expresses basis less than 25% of the time/requires cueing >75% of the time.  Social Interaction Social Interaction assist level: Interacts appropriately less than 25% of the time. May be withdrawn or combative.  Problem Solving Problem solving assist level: Solves basic less than 25% of the time - needs direction nearly all the time or does not effectively solve problems and may need a restraint for safety  Memory Memory assist level: Recognizes or recalls less than 25% of the time/requires cueing greater than 75% of the time   Short Term Goals: Week 1: SLP Short Term Goal 1 (Week 1): Pt will utilize word finding strategies to name common objects with Mod A cues.  SLP Short Term Goal 2 (Week 1): Pt will utilize external memory aids to recall dialy information (to include orientation information) with Mod A cues.  SLP Short Term Goal 3 (Week 1):  Pt will sustain attention to basic functional task for ~ 15  minutes with Mod A. SLP Short Term Goal 4 (Week 1): Pt will complete basic familiar task during ADLs with Mod A cues.  SLP Short Term Goal 5 (Week 1): Pt will consume trials of dysphagia 3 without overt s/s of oral phase dysphagia with Min A cues for use of compensatory swallow strategies.  SLP Short Term Goal 6 (Week 1): Pt will utilize speech intelligibility strategies to achieve ~ 75% intelligibility at the word level with Mod A cues.   Refer to Care Plan for Long Term Goals  Recommendations for other services: Neuropsych  Discharge Criteria: Patient will be discharged from SLP if patient refuses treatment 3 consecutive times without medical reason, if treatment goals not met, if there is a change in medical status, if patient makes no progress towards goals or if patient is discharged from hospital.  The above assessment, treatment plan, treatment alternatives and goals were discussed and mutually agreed upon: by patient and by family    02/24/2017, 2:35 PM  

## 2017-02-24 NOTE — Progress Notes (Signed)
Occupational Therapy Session Note  Patient Details  Name: Kaitlin [name] MRN: [id number] Date of Birth: 1946/04/19  Today's Date: 02/24/2017 OT Individual Time: 1300-1400 OT Individual Time Calculation (min): 60 min    Short Term Goals: Week 1:  OT Short Term Goal 1 (Week 1): Pt will recall hemi dressing techniques with mod multimodal cuing OT Short Term Goal 2 (Week 1): Pt will completed 1/3 toileting tasks with steadying assist OT Short Term Goal 3 (Week 1): Pt will attend to R side of body during bathing task with min cuing.  OT Short Term Goal 4 (Week 1): Pt will don pants with mod A  Skilled Therapeutic Interventions/Progress Updates:    Pt seen for OT ADL bathing/dressing session. Pt sitting up in w/c upon arrival with husband present assisting with lunch. Pt pleasant and agreeable to tx session. While pt ate lunch, worked on item identification and word finding, pt required to name items on plate, required mod-max A for correctly identifying items.  She denied bathing task this afternoon, opting just to put on clothes. She did not recall therapy session with this therapist from yesterday and therefore was unable to recall hemi dressing techniques. She required max multi-modal cuing for attending to R UE/LE during dressing task. She stood at sink with mod A to stand and max A standing balance while pt attempting to pull pants up despite verbal directions for static standing. Pt with strong R lean in standing, unable to attend to directions for midline and upright posture. She completed stand pivot transfer to toilet with use of grab bars. Total A for toileting task. Pt with blood in toilet bowl following urine and BM. RN made aware and assessed. Pt returned to sink to completed grooming tasks. Assist to initiate use of R UE in hand hygiene, however, pt able to follow through completion of task attending to R hand. She required max A to locate grooming items R of midline. Provided education to  pt's husband regarding R inattention and provided with examples to encourage R attention outside of tx sessions.  Pt left seated in w/c at end of session, all needs in reach, husband present and QRB donned.   Therapy Documentation Precautions:  Precautions Precautions: Fall Precaution Comments: R side inattention; impulsive Restrictions Weight Bearing Restrictions: No Pain:   No/ denies pain  See Function Navigator for Current Functional Status.   Therapy/Group: Individual Therapy  Lewis, [name] 02/24/2017, 7:18 AM

## 2017-02-24 NOTE — Progress Notes (Signed)
Subjective/Complaints:  Slept well. Per nursing, received trazodone 50 mg last night. Appears more awake this morning. Ready for breakfast. O2 placed yesterday evening, no documented desaturation or respiratory distress. Patient remains confused. She is oriented to person, not to place, time or situation.  ROS: Difficult to obtain secondary to mental status, does not appear to be in respiratory distress and no complaints of pain or breathing difficulties  Objective: Vital Signs: Blood pressure (!) 141/69, pulse 62, temperature 98 F (36.7 C), temperature source Oral, resp. rate 17, weight 53.1 kg (117 lb), SpO2 100 %. Dg Chest 2 View  Result Date: 02/24/2017 CLINICAL DATA:  CVA, weakness. EXAM: CHEST  2 VIEW COMPARISON:  Portable chest x-ray of February 18, 2017 FINDINGS: The lungs are well-expanded. The interstitial infiltrate observed in the right upper lobe has nearly resolved. Elsewhere the lungs are clear. There are small pleural effusions greatest on the right which later posteriorly. The heart and pulmonary vascularity are normal. There is calcification in the wall of the aortic arch. The bony thorax exhibits no acute abnormality. IMPRESSION: Improving appearance of infiltrate in the right upper lobe consistent with resolving pneumonia. Persistent small bilateral pleural effusions. Electronically Signed   By: David  Martinique M.D.   On: 02/24/2017 07:55   Results for orders placed or performed during the hospital encounter of 02/22/17 (from the past 72 hour(s))  CBC WITH DIFFERENTIAL     Status: Abnormal   Collection Time: 02/23/17  5:56 AM  Result Value Ref Range   WBC 13.1 (H) 4.0 - 10.5 K/uL   RBC 3.72 (L) 3.87 - 5.11 MIL/uL   Hemoglobin 11.7 (L) 12.0 - 15.0 g/dL   HCT 34.6 (L) 36.0 - 46.0 %   MCV 93.0 78.0 - 100.0 fL   MCH 31.5 26.0 - 34.0 pg   MCHC 33.8 30.0 - 36.0 g/dL   RDW 13.3 11.5 - 15.5 %   Platelets 288 150 - 400 K/uL   Neutrophils Relative % 75 %   Lymphocytes Relative 15  %   Monocytes Relative 7 %   Eosinophils Relative 3 %   Basophils Relative 0 %   Neutro Abs 9.8 (H) 1.7 - 7.7 K/uL   Lymphs Abs 2.0 0.7 - 4.0 K/uL   Monocytes Absolute 0.9 0.1 - 1.0 K/uL   Eosinophils Absolute 0.4 0.0 - 0.7 K/uL   Basophils Absolute 0.0 0.0 - 0.1 K/uL   WBC Morphology MILD LEFT SHIFT (1-5% METAS, OCC MYELO, OCC BANDS)   Comprehensive metabolic panel     Status: Abnormal   Collection Time: 02/23/17  5:56 AM  Result Value Ref Range   Sodium 136 135 - 145 mmol/L   Potassium 3.9 3.5 - 5.1 mmol/L   Chloride 106 101 - 111 mmol/L   CO2 24 22 - 32 mmol/L   Glucose, Bld 104 (H) 65 - 99 mg/dL   BUN 11 6 - 20 mg/dL   Creatinine, Ser 0.72 0.44 - 1.00 mg/dL   Calcium 8.4 (L) 8.9 - 10.3 mg/dL   Total Protein 6.2 (L) 6.5 - 8.1 g/dL   Albumin 2.7 (L) 3.5 - 5.0 g/dL   AST 60 (H) 15 - 41 U/L   ALT 81 (H) 14 - 54 U/L   Alkaline Phosphatase 70 38 - 126 U/L   Total Bilirubin 0.5 0.3 - 1.2 mg/dL   GFR calc non Af Amer >60 >60 mL/min   GFR calc Af Amer >60 >60 mL/min    Comment: (NOTE) The eGFR has been  calculated using the CKD EPI equation. This calculation has not been validated in all clinical situations. eGFR's persistently <60 mL/min signify possible Chronic Kidney Disease.    Anion gap 6 5 - 15  Urinalysis, Routine w reflex microscopic     Status: Abnormal   Collection Time: 02/23/17  5:27 PM  Result Value Ref Range   Color, Urine YELLOW YELLOW   APPearance HAZY (A) CLEAR   Specific Gravity, Urine 1.014 1.005 - 1.030   pH 6.0 5.0 - 8.0   Glucose, UA NEGATIVE NEGATIVE mg/dL   Hgb urine dipstick LARGE (A) NEGATIVE   Bilirubin Urine NEGATIVE NEGATIVE   Ketones, ur NEGATIVE NEGATIVE mg/dL   Protein, ur 30 (A) NEGATIVE mg/dL   Nitrite NEGATIVE NEGATIVE   Leukocytes, UA NEGATIVE NEGATIVE   RBC / HPF TOO NUMEROUS TO COUNT 0 - 5 RBC/hpf   WBC, UA 6-30 0 - 5 WBC/hpf   Bacteria, UA RARE (A) NONE SEEN   Squamous Epithelial / LPF NONE SEEN NONE SEEN   Mucous PRESENT     Hyaline Casts, UA PRESENT      HEENT: normal Cardio: RRR and no murmur Resp: CTA B/L and unlabored GI: BS positive and NT, ND Extremity:  Pulses positive and No Edema Skin:   Intact Neuro: Lethargic, Confused, Abnormal Sensory Reduced sensation to pinch RUE, Abnormal Motor trace delt, 2- bi, tri, trace finger ext, 2- flexion, 2- hip/kne ext R Trace at ankle, Abnormal FMC Ataxic/ dec FMC and Dysarthric Musc/Skel:  Swelling RIght dorsal hand edema Gen NAD   Assessment/Plan: 1. Functional deficits secondary to Left MCA infarct which require 3+ hours per day of interdisciplinary therapy in a comprehensive inpatient rehab setting. Physiatrist is providing close team supervision and 24 hour management of active medical problems listed below. Physiatrist and rehab team continue to assess barriers to discharge/monitor patient progress toward functional and medical goals. FIM: Function - Bathing Position: Shower Body parts bathed by patient: Chest, Abdomen, Left upper leg Body parts bathed by helper: Right arm, Left arm, Right upper leg, Front perineal area, Buttocks, Right lower leg, Back, Left lower leg  Function- Upper Body Dressing/Undressing What is the patient wearing?: Bra, Pull over shirt/dress Bra - Perfomed by helper: Thread/unthread right bra strap, Thread/unthread left bra strap, Hook/unhook bra (pull down sports bra) Pull over shirt/dress - Perfomed by patient: Put head through opening, Thread/unthread left sleeve Pull over shirt/dress - Perfomed by helper: Thread/unthread right sleeve, Pull shirt over trunk Function - Lower Body Dressing/Undressing What is the patient wearing?: Pants, Non-skid slipper socks Position: Other (comment) (Sitting on toilet) Pants- Performed by helper: Thread/unthread right pants leg, Thread/unthread left pants leg, Pull pants up/down, Fasten/unfasten pants Non-skid slipper socks- Performed by helper: Don/doff right sock, Don/doff left sock Assist  for footwear: Dependant  Function - Toileting Toileting activity did not occur: N/A (wearing gown) Toileting steps completed by helper: Adjust clothing prior to toileting, Performs perineal hygiene, Adjust clothing after toileting Toileting Assistive Devices: Grab bar or rail  Function - Air cabin crew transfer assistive device: Grab bar Assist level to toilet: Maximal assist (Pt 25 - 49%/lift and lower) Assist level from toilet: Maximal assist (Pt 25 - 49%/lift and lower)  Function - Chair/bed transfer Chair/bed transfer method: Squat pivot Chair/bed transfer assist level: Maximal assist (Pt 25 - 49%/lift and lower)  Function - Locomotion: Wheelchair Will patient use wheelchair at discharge?: Yes Type: Manual Max wheelchair distance: 66f  Assist Level: Moderate assistance (Pt 50 - 74%) Assist Level:  Moderate assistance (Pt 50 - 74%) Wheel 150 feet activity did not occur: Safety/medical concerns Function - Locomotion: Ambulation Assistive device: Hand held assist Max distance: 12 Assist level: Maximal assist (Pt 25 - 49%) Assist level: Maximal assist (Pt 25 - 49%) Walk 50 feet with 2 turns activity did not occur: Safety/medical concerns Walk 150 feet activity did not occur: Safety/medical concerns Walk 10 feet on uneven surfaces activity did not occur: Safety/medical concerns  Function - Comprehension Comprehension: Auditory Comprehension assist level: Understands basic less than 25% of the time/ requires cueing >75% of the time  Function - Expression Expression: Verbal Expression assist level: Expresses basis less than 25% of the time/requires cueing >75% of the time.  Function - Social Interaction Social Interaction assist level: Interacts appropriately less than 25% of the time. May be withdrawn or combative.  Function - Problem Solving Problem solving assist level: Solves basic less than 25% of the time - needs direction nearly all the time or does not  effectively solve problems and may need a restraint for safety  Function - Memory Memory assist level: Recognizes or recalls less than 25% of the time/requires cueing greater than 75% of the time Patient normally able to recall (first 3 days only): That he or she is in a hospital, Current season  Medical Problem List and Plan: 1.  Right hemiparesis and language deficits secondary to left MCA infarct             -CIR PT OT, SLP , continue program, if tolerance is an issue may need to change to 15/7 2.  DVT Prophylaxis/Anticoagulation: Pharmaceutical: Xarelto and Plavix, high risk of bleeding, monitor 3. Pain Management: tylenol prn 4. Mood: LCSW to follow for evaluation and support. Sleep/wake cycle  5. Neuropsych: This patient is not capable of making decisions on her own behalf. 6. Skin/Wound Care: Routine pressure relief measures.  7. Fluids/Electrolytes/Nutrition: Monitor I/O. Normal BUN and creatinine on 02/23/2017 Intake is 25-75% of meals. 8. A fib: Monitor HR bid. Continue amiodarone and metoprolol for HR control.   9. Aspiration PNA: Continue Levaquin Day # 5. Continue to encourage IS. Aspiration precautions.  10 Leukocytosis: Reactive to PNA and resolving with treatment. Increased to 13.1K afebrile , monitor  11. Hypokalemia: Improving with supplement--2.8-->2.8-->3.2. 3.9 on . Will continue to supplement for now. Recheck in am.  12. ABLA: Will recheck CBC in am. Monitor for signs of bleeding. Hgb mildly reduced at 11.7 no rx needed 13. Multiple Sclerosis: No medications? 14. H/o Depression: On Effexor and prozac.  47. H/o CAD s/p PTCA: On Lipitor  16. Lethargy: likely multifactorial due to altered sleep cycle,  Stroke, MS, pneumonia, sedative given last night             -appears improved in terms of sleep last night             -trazodone prn tonight for insomnia As discussed with husband. If patient has difficulty staying awake for therapy, may need a stimulant such as  methylphenidate during the day   LOS (Days) 2 A FACE TO FACE EVALUATION WAS PERFORMED  Kaitlin [name] E 02/24/2017, 9:36 AM

## 2017-02-25 ENCOUNTER — Inpatient Hospital Stay (HOSPITAL_COMMUNITY): Payer: Self-pay | Admitting: Physical Therapy

## 2017-02-25 ENCOUNTER — Inpatient Hospital Stay (HOSPITAL_COMMUNITY): Payer: Medicare Other | Admitting: Speech Pathology

## 2017-02-25 ENCOUNTER — Inpatient Hospital Stay (HOSPITAL_COMMUNITY): Payer: Self-pay | Admitting: Occupational Therapy

## 2017-02-25 LAB — URINE CULTURE: CULTURE: NO GROWTH

## 2017-02-25 NOTE — Progress Notes (Signed)
Occupational Therapy Session Note  Patient Details  Name: Kaitlin [name] MRN: [id number] Date of Birth: [birth year]  Today's Date: 02/25/2017 OT Individual Time: 0845-1000 OT Individual Time Calculation (min): 75 min    Short Term Goals: Week 1:  OT Short Term Goal 1 (Week 1): Pt will recall hemi dressing techniques with mod multimodal cuing OT Short Term Goal 2 (Week 1): Pt will completed 1/3 toileting tasks with steadying assist OT Short Term Goal 3 (Week 1): Pt will attend to R side of body during bathing task with min cuing.  OT Short Term Goal 4 (Week 1): Pt will don pants with mod A  Skilled Therapeutic Interventions/Progress Updates:    Pt seen for OT ADL bathing/dressing session. Pt in supine upon arrival with husband present. Pt alert and agreeable to tx session. Throughout session, completed stand pivot transfers with mod A and VCs for hand placement. Completed toileting task with total A, unable to void. She showered seated on 3-1 BSC, pt attempted to initiate movement and use R UE, however, due to apraxia was unable to use at functional level. She required max multimodal cuing for attention to R side as well as alternating to other body parts. She returned to w/c to dress, unable to recall hemi dressing technique with each article of clothing and ultimately requiring max- total A for each piece. Pt becoming somnolent during dressing task, could be easily aroused, however, would quickly fall back asleep. O2 remained at 100%. Pt returned to bed at end of session, min A to return to supine. Left in supine with husband present.   Therapy Documentation Precautions:  Precautions Precautions: Fall Precaution Comments: R side inattention; impulsive Restrictions Weight Bearing Restrictions: No Pain:   No/ denies pain  See Function Navigator for Current Functional Status.   Therapy/Group: Individual Therapy  Lewis, Beronica Lansdale C 02/25/2017, 7:07 AM

## 2017-02-25 NOTE — IPOC Note (Signed)
Overall Plan of Care [facility]) Patient Details Name: Kaitlin [name] MRN: [id number] DOB: 09-29-1945  Admitting Diagnosis: L CVA  Hospital Problems: Active Problems:   Acute ischemic left middle cerebral artery (MCA) stroke (HCC)   Abnormality of gait as late effect of stroke   Dysarthria as late effect of stroke   Dysphagia as late effect of stroke     Functional Problem List: Nursing Bladder, Bowel, Endurance, Medication Management, Motor, Nutrition, Safety, Sensory, Skin Integrity  PT Balance, Behavior, Endurance, Motor, Nutrition, Pain, Perception, Safety, Sensory, Skin Integrity  OT Balance, Cognition, Endurance, Safety, Perception, Pain, Motor, Sensory  SLP Cognition, Endurance, Linguistic, Motor, Nutrition, Perception, Safety  TR         Basic ADL's: OT Eating, Grooming, Bathing, Dressing, Toileting     Advanced  ADL's: OT       Transfers: PT Bed Mobility, Bed to Chair, Car, [employer], Civil Service fast streamer, Research scientist (life sciences): PT Ambulation, Psychologist, prison and probation services, Stairs     Additional Impairments: OT Fuctional Use of Upper Extremity  SLP Swallowing, Communication, Social Cognition comprehension, expression Social Interaction, Problem Solving, Memory, Attention, Awareness  TR      Anticipated Outcomes Item Anticipated Outcome  Self Feeding Supervision/ set-up  Swallowing  Min A   Basic self-care  Min A  Toileting  Min A   Bathroom Transfers Min A  Bowel/Bladder  managed mod assist   Transfers  Min assist with LRAD   Locomotion  Ambulatory with Min assist with LRAD for household distances.   Communication  Min A  Cognition  Min A  Pain  less than 3   Safety/Judgment  min assist    Therapy Plan: PT Intensity: Minimum of 1-2 x/day ,45 to 90 minutes PT Frequency: 5 out of 7 days PT Duration Estimated Length of Stay: 3-3.5 weeks  OT Intensity: Minimum of 1-2 x/day, 45 to 90 minutes OT Frequency: 5 out of 7 days OT Duration/Estimated Length of  Stay: 3-3.5 weeks SLP Intensity: Minumum of 1-2 x/day, 30 to 90 minutes SLP Frequency: 3 to 5 out of 7 days SLP Duration/Estimated Length of Stay: 3 weeks       Team Interventions: Nursing Interventions Patient/Family Education, Bladder Management, Bowel Management, Disease Management/Prevention, Medication Management, Skin Care/Wound Management, Cognitive Remediation/Compensation, Dysphagia/Aspiration Precaution Training, Psychosocial Support  PT interventions Ambulation/gait training, Warden/ranger, Cognitive remediation/compensation, Community reintegration, Discharge planning, Disease management/prevention, DME/adaptive equipment instruction, Functional electrical stimulation, Functional mobility training, Neuromuscular re-education, Pain management, Psychosocial support, Splinting/orthotics, Stair training, Therapeutic Activities, Patient/family education, UE/LE Strength taining/ROM, UE/LE Coordination activities, Visual/perceptual remediation/compensation, Therapeutic Exercise, Wheelchair propulsion/positioning  OT Interventions Warden/ranger, Cognitive remediation/compensation, Community reintegration, Discharge planning, Disease mangement/prevention, DME/adaptive equipment instruction, Functional electrical stimulation, Functional mobility training, Neuromuscular re-education, Pain management, Patient/family education, Psychosocial support, Self Care/advanced ADL retraining, Skin care/wound managment, Therapeutic Activities, Therapeutic Exercise, UE/LE Strength taining/ROM, UE/LE Coordination activities  SLP Interventions Cognitive remediation/compensation, Cueing hierarchy, DME/adaptive equipment instruction, Dysphagia/aspiration precaution training, Environmental controls, Functional tasks, Internal/external aids, Patient/family education, Therapeutic Activities, Speech/Language facilitation  TR Interventions    SW/CM Interventions Discharge Planning, Psychosocial  Support, Patient/Family Education    Team Discharge Planning: Destination: PT-Home ,OT- Home , SLP-Home Projected Follow-up: PT-Home health PT, OT-  Home health OT, SLP-Home Health SLP, Outpatient SLP, 24 hour supervision/assistance, Skilled Nursing facility Projected Equipment Needs: PT-Rolling walker with 5" wheels, Wheelchair (measurements), Wheelchair cushion (measurements), OT- To be determined, SLP-None recommended by SLP Equipment Details: PT- , OT-  Patient/family involved in discharge planning: PT- Patient,  Family member/caregiver,  OT-Patient, Family member/caregiver, SLP-Family member/caregiver, Patient  MD ELOS: 20-25d Medical Rehab Prognosis:  Good Assessment:  71 y.o.female with history of CAD s/p PTCA, MS with gait disorder/memory loss, PAF who was admitted on 02/14/17 with reports of speech difficulty progressing to right sided weakness later that day. CTA head showed evidence of infarct left insular and left posterior putamen with occlusion of L-CCA and L-ICA from arch to distal cavernous segment and incidental finding of centrilobar emphysema in lung apices. She underwent cerebral angio with complete revascularization of occluded L-MCA with one pass Solitaire and stent assisted angioplasty of proximal L-ICA with IA integrelin. Follow up MRI brain done revealing acute confluent infarct involving the left putamen, caudate body, and corona radiata, 2 tiny cortical infarcts in the left MCA distribution and extensive white matter disease due to MS and chronic microvascular ischemia. She tolerated extubation on 7/18 but was found to develop A fib with RVR requiring amiodarone and esmolol. Dr. Delton See recommended Eliquis bid for A fib (as likely cause of stroke) and changing Brilinta to Plavix.    Now requiring 24/7 Rehab RN,MD, as well as CIR level PT, OT and SLP.  Treatment team will focus on ADLs and mobility with goals set at [facility]  See Team Conference Notes for weekly updates to  the plan of care

## 2017-02-25 NOTE — Progress Notes (Signed)
Social Work Patient ID: Kaitlin [name], female   DOB: 1945-11-02, 71 y.o.   MRN: [id number]   CSW met with pt and husband to complete assessment 02-24-17 and to update on team conference discussion.  CSW explained that no date was set due to pt just getting started with rehab, but that we anticipated 2 1/2 to 3 weeks.  He will be her caregiver and has no limitations.  CSW will continue to follow and assist as needed.

## 2017-02-25 NOTE — Progress Notes (Signed)
Social Work Assessment and Plan  Patient Details  Name: Kaitlin [name] MRN: [id number] Date of Birth: March 08, 1946  Today's Date: 02/24/2017  Problem List:  Patient Active Problem List   Diagnosis Date Noted  . Abnormality of gait as late effect of stroke 02/23/2017  . Dysarthria as late effect of stroke 02/23/2017  . Dysphagia as late effect of stroke 02/23/2017  . Acute ischemic left middle cerebral artery (MCA) stroke ([place]) 02/22/2017  . Paroxysmal A-fib ([place]) 02/17/2017  . Atrial fibrillation with RVR ([place]) 02/17/2017  . HLD (hyperlipidemia) 02/17/2017  . Acute hypoxemic respiratory failure ([place])   . Stroke (cerebrum) (H[facility]) - Acute MCA infarct due to left CCA/ICA and MCA occlusion, s/p mechanical thrombectomy and left ICA stenting in the setting of newly diagnosed afib 02/14/2017   Past Medical History:  Past Medical History:  Diagnosis Date  . CAD S/P percutaneous coronary angioplasty 2001  . CVA (cerebral vascular accident) ([place]) 01/2017  . HCVD (hypertensive cardiovascular disease) 01/2017   normal LVF with moderate LVH, grade 1 DD  . High cholesterol   . Multiple sclerosis ([place])   . PAF (paroxysmal atrial fibrillation) ([place]) 01/2017   Past Surgical History:  Past Surgical History:  Procedure Laterality Date  . CORONARY ANGIOPLASTY WITH STENT PLACEMENT  11/1999   RCA DES-[facility]  . IR ANGIO INTRA EXTRACRAN SEL COM CAROTID INNOMINATE UNI R MOD SED  02/15/2017  . IR ANGIO VERTEBRAL SEL SUBCLAVIAN INNOMINATE UNI R MOD SED  02/15/2017  . IR INTRAVSC STENT CERV CAROTID W/O EMB-PROT MOD SED INC ANGIO  02/15/2017  . IR PERCUTANEOUS ART THROMBECTOMY/INFUSION INTRACRANIAL INC DIAG ANGIO  02/15/2017  . NECK SURGERY    . RADIOLOGY WITH ANESTHESIA N/A 02/14/2017   Procedure: RADIOLOGY WITH ANESTHESIA;  Surgeon: Radiologist, Medication, MD;  Location: [facility];  Service: Radiology;  Laterality: N/A;  . WRIST SURGERY     Social History:  reports that she has never smoked. She has never  used smokeless tobacco. She reports that she drinks about 1.8 oz of alcohol per week . She reports that she does not use drugs.  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   Abuse/Neglect Physical Abuse: Denies Verbal Abuse: Denies Sexual Abuse: Denies Exploitation of patient/patient's resources: Denies Self-Neglect: Denies  Emotional Status Pt's affect, behavior adn adjustment status: Pt was very sleepy during CSW visit, so husband provided most of the information.  She appeared comfortable, but is only oriented to self currently. Recent Psychosocial Issues: Pt with a hx of MS which mainly affected short term memory. Pyschiatric History: none reported Substance Abuse History: none reported  Patient / Family Perceptions, Expectations & Goals Pt/Family understanding of illness & functional limitations: Pt's husband has a good understanding of pt's limitations, but pt is not able to see this at this time. Premorbid pt/family roles/activities: Pt enjoyed working in her garden, traveling with her husband, being at church and with friends. Anticipated changes in roles/activities/participation: Pt/husband hope they can resume activiites as she  is able. Pt/family expectations/goals: Pt's husband  just wants pt to be able to get back to enjoying her garden/orchids.  Wants her to be able to be cared for at home.  Community Resources Express Scripts: None Premorbid Home Care/DME Agencies: None Transportation available at discharge: husband Resource referrals recommended: Neuropsychology, Support group (specify) (Stroke support group)  Discharge Planning Living Arrangements: Spouse/significant other Support Systems: Spouse/significant other, Other relatives, Friends/neighbors, Social worker community Type of Residence: Private residence Insurance Resources: Multimedia programmer (specify) (Marine scientist) Financial Resources: Radio broadcast assistant Screen Referred: No Money Management: Spouse Does the patient have any problems obtaining your medications?: No Home Management: Pt and husband were sharing responsibilities, but he is considering hiring a Secretary/administrator. Patient/Family Preliminary Plans: Pt's husband to provide care for pt at home. Social Work Anticipated Follow Up Needs: HH/OP, Support Group Expected length of stay: ELOS 18-21 days  Clinical Impression CSW met with pt and her husband to introduce self and role of CSW, as well as to complete assessment.  Pt slept during much of CSW's visit, but was pleasant and friendly when she was awake.  Pt's husband is pleased with care pt has received all along the way at [facility].  He is hopeful about her recovery on CIR.  He plans to be her caregiver and is willing to adapt home however he needs to, he just wants therapists to let him know.  Pt can stay on first floor in her home.  No current concerns/needs/questions at this time.  CSW will continue to follow and assist as needed.  Kaitlin [name], Silvestre Mesi 02/25/2017, 8:47 AM

## 2017-02-25 NOTE — Progress Notes (Signed)
Physical Therapy Session Note  Patient Details  Name: Kaitlin [name] MRN: [id number] Date of Birth: 1945/12/23  Today's Date: 02/25/2017 PT Individual Time: 1120-1208 AND 1415-1430 PT Individual Time Calculation (min): 48 min AND 29mn    Short Term Goals: Week 1:  PT Short Term Goal 1 (Week 1): Pt will perform bed mobility with min assist 75% of trials PT Short Term Goal 2 (Week 1): Pt will perform squat pivot transfer with mod assist  PT Short Term Goal 3 (Week 1): Pt will ambulate 597fwith mod assist and LRAD  PT Short Term Goal 4 (Week 1): Pt will propell WC 10033fith min assist   Skilled Therapeutic Interventions/Progress Updates:   Pt received supine in bed and agreeable to PT. Supine>sit transfer with min assist and  Moderate cues for sequecning and awareness of R UE.   Sitting balance EOB with min progressing to supervision assist.   Squat [facility] assist from PT and max cues for set up and sequencing to prevent lateral/posterior LOB. PT transported pt to rehab gym.   Sit<>stand transfers with min assist and moderate cues for setup and improved anteiror weight shift x 4. Following by 4 bouts of Standing balance. Moderate-max cues for midline orientation and L lateral weight shifting in RW. Pt noted to have labored breathing on 2nd and 3rd bout of standing balance.   Vitals assessed in sitting following 3rd bout of standing. 95/49 in sitting. And 91/26 in standing. Pt returned to room  PT assessed Orthostatics. 121/106 in standing, 100/44 in sitting. HR remained ~50 with and pO2 >93% with 2 LO2 RN made aware of irregular results.     Session 2 Pt received sitting in WC and aroused with difficulty. PT Assessed vital signs. Pt unable to remain awake while PT assessed vitals and required moderate effort to arouse each time pt fell asleep.   PT instructed pt in squat pivot transfer to bed with mod assist. Lateral scooted EOB with min assist to the L and R. Max cues  for placement and sequencing.   Sit>supine with min assist to improved control of the RLE. Pt noted to fall asleep once in bed and had minimal arousal to stimuli. Pt left supine in bed with PRAFO in place and all needs met.        Therapy Documentation Precautions:  Precautions Precautions: Fall Precaution Comments: R side inattention; impulsive Restrictions Weight Bearing Restrictions: No General: PT Amount of Missed Time (min): 15 Minutes PT Missed Treatment Reason: Patient fatigue Vital Signs: See above Pain: 0/10   See Function Navigator for Current Functional Status.   Therapy/Group: Individual Therapy  AusLorie Phenix27/2018, 1:28 PM

## 2017-02-25 NOTE — Progress Notes (Signed)
Speech Language Pathology Daily Session Note  Patient Details  Name: Kaitlin [name] MRN: [id number] Date of Birth: February 02, 1946  Today's Date: 02/25/2017 SLP Individual Time: 0730-0830 SLP Individual Time Calculation (min): 60 min  Short Term Goals: Week 1: SLP Short Term Goal 1 (Week 1): Pt will utilize word finding strategies to name common objects with Mod A cues.  SLP Short Term Goal 2 (Week 1): Pt will utilize external memory aids to recall dialy information (to include orientation information) with Mod A cues.  SLP Short Term Goal 3 (Week 1): Pt will sustain attention to basic functional task for ~ 15 minutes with Mod A. SLP Short Term Goal 4 (Week 1): Pt will complete basic familiar task during ADLs with Mod A cues.  SLP Short Term Goal 5 (Week 1): Pt will consume trials of dysphagia 3 without overt s/s of oral phase dysphagia with Min A cues for use of compensatory swallow strategies.  SLP Short Term Goal 6 (Week 1): Pt will utilize speech intelligibility strategies to achieve ~ 75% intelligibility at the word level with Mod A cues.   Skilled Therapeutic Interventions: Skilled treatment session focused on dysphagia and cognition goals. SLP facilitated session by providing skilled observation of pt consuming dysphagia 2 breakfast tray with thin liquids. Pt with 2 occasions of cough when attempting to talk when eating. Pt required Max A cues initially to stop talking but then she was able to maintain quietness as Max cues. During consumption, pt demonstrated inattention to right. SLP turned plate to aid in finding items. SLP further facilitated session by providing Total A for calendar use and use of memory book. Further education provided to husband on language facilitation and no talking during PO consumption. Pt left upright in bed with husband present and MD in room.     Function:  Eating Eating   Modified Consistency Diet: Yes Eating Assist Level: Set up assist for   Eating Set Up  Assist For: Opening containers       Cognition Comprehension Comprehension assist level: Understands basic less than 25% of the time/ requires cueing >75% of the time  Expression   Expression assist level: Expresses basis less than 25% of the time/requires cueing >75% of the time.  Social Interaction Social Interaction assist level: Interacts appropriately less than 25% of the time. May be withdrawn or combative.  Problem Solving Problem solving assist level: Solves basic less than 25% of the time - needs direction nearly all the time or does not effectively solve problems and may need a restraint for safety  Memory Memory assist level: Recognizes or recalls less than 25% of the time/requires cueing greater than 75% of the time    Pain    Therapy/Group: Individual Therapy  Loan Oguin 02/25/2017, 10:28 AM

## 2017-02-25 NOTE — Progress Notes (Signed)
Subjective/Complaints: No issues overnight. Other than patient did not have PR AFO on. When he came in this morning. The patient does not remember her therapies yesterday. She does not remember my name.   ROS: Difficult to obtain secondary to mental status, does not appear to be in respiratory distress and no complaints of pain or breathing difficulties  Objective: Vital Signs: Blood pressure 124/60, pulse 75, temperature 98.1 F (36.7 C), temperature source Oral, resp. rate 18, weight 53.1 kg (117 lb), SpO2 97 %. Dg Chest 2 View  Result Date: 02/24/2017 CLINICAL DATA:  CVA, weakness. EXAM: CHEST  2 VIEW COMPARISON:  Portable chest x-ray of February 18, 2017 FINDINGS: The lungs are well-expanded. The interstitial infiltrate observed in the right upper lobe has nearly resolved. Elsewhere the lungs are clear. There are small pleural effusions greatest on the right which later posteriorly. The heart and pulmonary vascularity are normal. There is calcification in the wall of the aortic arch. The bony thorax exhibits no acute abnormality. IMPRESSION: Improving appearance of infiltrate in the right upper lobe consistent with resolving pneumonia. Persistent small bilateral pleural effusions. Electronically Signed   By: David  Martinique M.D.   On: 02/24/2017 07:55   Results for orders placed or performed during the hospital encounter of 02/22/17 (from the past 72 hour(s))  CBC WITH DIFFERENTIAL     Status: Abnormal   Collection Time: 02/23/17  5:56 AM  Result Value Ref Range   WBC 13.1 (H) 4.0 - 10.5 K/uL   RBC 3.72 (L) 3.87 - 5.11 MIL/uL   Hemoglobin 11.7 (L) 12.0 - 15.0 g/dL   HCT 34.6 (L) 36.0 - 46.0 %   MCV 93.0 78.0 - 100.0 fL   MCH 31.5 26.0 - 34.0 pg   MCHC 33.8 30.0 - 36.0 g/dL   RDW 13.3 11.5 - 15.5 %   Platelets 288 150 - 400 K/uL   Neutrophils Relative % 75 %   Lymphocytes Relative 15 %   Monocytes Relative 7 %   Eosinophils Relative 3 %   Basophils Relative 0 %   Neutro Abs 9.8 (H)  1.7 - 7.7 K/uL   Lymphs Abs 2.0 0.7 - 4.0 K/uL   Monocytes Absolute 0.9 0.1 - 1.0 K/uL   Eosinophils Absolute 0.4 0.0 - 0.7 K/uL   Basophils Absolute 0.0 0.0 - 0.1 K/uL   WBC Morphology MILD LEFT SHIFT (1-5% METAS, OCC MYELO, OCC BANDS)   Comprehensive metabolic panel     Status: Abnormal   Collection Time: 02/23/17  5:56 AM  Result Value Ref Range   Sodium 136 135 - 145 mmol/L   Potassium 3.9 3.5 - 5.1 mmol/L   Chloride 106 101 - 111 mmol/L   CO2 24 22 - 32 mmol/L   Glucose, Bld 104 (H) 65 - 99 mg/dL   BUN 11 6 - 20 mg/dL   Creatinine, Ser 0.72 0.44 - 1.00 mg/dL   Calcium 8.4 (L) 8.9 - 10.3 mg/dL   Total Protein 6.2 (L) 6.5 - 8.1 g/dL   Albumin 2.7 (L) 3.5 - 5.0 g/dL   AST 60 (H) 15 - 41 U/L   ALT 81 (H) 14 - 54 U/L   Alkaline Phosphatase 70 38 - 126 U/L   Total Bilirubin 0.5 0.3 - 1.2 mg/dL   GFR calc non Af Amer >60 >60 mL/min   GFR calc Af Amer >60 >60 mL/min    Comment: (NOTE) The eGFR has been calculated using the CKD EPI equation. This calculation has not been  validated in all clinical situations. eGFR's persistently <60 mL/min signify possible Chronic Kidney Disease.    Anion gap 6 5 - 15  Urinalysis, Routine w reflex microscopic     Status: Abnormal   Collection Time: 02/23/17  5:27 PM  Result Value Ref Range   Color, Urine YELLOW YELLOW   APPearance HAZY (A) CLEAR   Specific Gravity, Urine 1.014 1.005 - 1.030   pH 6.0 5.0 - 8.0   Glucose, UA NEGATIVE NEGATIVE mg/dL   Hgb urine dipstick LARGE (A) NEGATIVE   Bilirubin Urine NEGATIVE NEGATIVE   Ketones, ur NEGATIVE NEGATIVE mg/dL   Protein, ur 30 (A) NEGATIVE mg/dL   Nitrite NEGATIVE NEGATIVE   Leukocytes, UA NEGATIVE NEGATIVE   RBC / HPF TOO NUMEROUS TO COUNT 0 - 5 RBC/hpf   WBC, UA 6-30 0 - 5 WBC/hpf   Bacteria, UA RARE (A) NONE SEEN   Squamous Epithelial / LPF NONE SEEN NONE SEEN   Mucous PRESENT    Hyaline Casts, UA PRESENT      HEENT: normal Cardio: RRR and no murmur Resp: CTA B/L and unlabored GI:  BS positive and NT, ND Extremity:  Pulses positive and No Edema Skin:   Intact Neuro: Lethargic, Confused, Abnormal Sensory Reduced sensation to pinch RUE, Abnormal Motor trace delt, 2- bi, tri, trace finger ext, 2- flexion, 2- hip/kne ext R Trace at ankle, Abnormal FMC Ataxic/ dec FMC and Dysarthric Musc/Skel:  Swelling RIght dorsal hand edema Gen NAD   Assessment/Plan: 1. Functional deficits secondary to Left MCA infarct which require 3+ hours per day of interdisciplinary therapy in a comprehensive inpatient rehab setting. Physiatrist is providing close team supervision and 24 hour management of active medical problems listed below. Physiatrist and rehab team continue to assess barriers to discharge/monitor patient progress toward functional and medical goals. FIM: Function - Bathing Position: Shower Body parts bathed by patient: Chest, Abdomen, Left upper leg Body parts bathed by helper: Right arm, Left arm, Right upper leg, Front perineal area, Buttocks, Right lower leg, Back, Left lower leg  Function- Upper Body Dressing/Undressing What is the patient wearing?: Pull over shirt/dress Bra - Perfomed by helper: Thread/unthread right bra strap, Thread/unthread left bra strap, Hook/unhook bra (pull down sports bra) Pull over shirt/dress - Perfomed by patient: Pull shirt over trunk, Put head through opening Pull over shirt/dress - Perfomed by helper: Thread/unthread right sleeve, Thread/unthread left sleeve Function - Lower Body Dressing/Undressing What is the patient wearing?: Pants, Non-skid slipper socks Position: Wheelchair/chair at sink Pants- Performed by helper: Thread/unthread right pants leg, Thread/unthread left pants leg, Pull pants up/down, Fasten/unfasten pants Non-skid slipper socks- Performed by patient: Don/doff right sock, Don/doff left sock Non-skid slipper socks- Performed by helper: Don/doff right sock, Don/doff left sock Assist for footwear: Partial/moderate  assist  Function - Toileting Toileting activity did not occur: N/A (wearing gown) Toileting steps completed by helper: Adjust clothing prior to toileting, Performs perineal hygiene, Adjust clothing after toileting Toileting Assistive Devices: Grab bar or rail Assist level: Two helpers (For I/O Cath at bed level)  Function - Air cabin crew transfer assistive device: Grab bar Assist level to toilet: Maximal assist (Pt 25 - 49%/lift and lower) Assist level from toilet: Maximal assist (Pt 25 - 49%/lift and lower)  Function - Chair/bed transfer Chair/bed transfer method: Squat pivot Chair/bed transfer assist level: Maximal assist (Pt 25 - 49%/lift and lower)  Function - Locomotion: Wheelchair Will patient use wheelchair at discharge?: Yes Type: Manual Max wheelchair distance: 47f  Assist  Level: Moderate assistance (Pt 50 - 74%) Assist Level: Moderate assistance (Pt 50 - 74%) Wheel 150 feet activity did not occur: Safety/medical concerns Function - Locomotion: Ambulation Assistive device: Hand held assist Max distance: 12 Assist level: Maximal assist (Pt 25 - 49%) Assist level: Maximal assist (Pt 25 - 49%) Walk 50 feet with 2 turns activity did not occur: Safety/medical concerns Walk 150 feet activity did not occur: Safety/medical concerns Walk 10 feet on uneven surfaces activity did not occur: Safety/medical concerns  Function - Comprehension Comprehension: Auditory Comprehension assist level: Understands basic less than 25% of the time/ requires cueing >75% of the time  Function - Expression Expression: Verbal Expression assist level: Expresses basis less than 25% of the time/requires cueing >75% of the time.  Function - Social Interaction Social Interaction assist level: Interacts appropriately less than 25% of the time. May be withdrawn or combative.  Function - Problem Solving Problem solving assist level: Solves basic less than 25% of the time - needs direction  nearly all the time or does not effectively solve problems and may need a restraint for safety  Function - Memory Memory assist level: Recognizes or recalls less than 25% of the time/requires cueing greater than 75% of the time Patient normally able to recall (first 3 days only): None of the above  Medical Problem List and Plan: 1.  Right hemiparesis and language deficits secondary to left MCA infarct             -CIR PT OT, SLP ,2.  DVT Prophylaxis/Anticoagulation: Pharmaceutical: Xarelto and Plavix, high risk of bleeding, monitor 3. Pain Management: tylenol prn 4. Mood: LCSW to follow for evaluation and support. Sleep/wake cycle  5. Neuropsych: This patient is not capable of making decisions on her own behalf. 6. Skin/Wound Care: Routine pressure relief measures.  7. Fluids/Electrolytes/Nutrition: Monitor I/O. Normal BUN and creatinine on 02/23/2017, continue to monitor intake 8. A fib: Monitor HR bid. Continue amiodarone and metoprolol for HR control.   9. Aspiration PNA: Continue Levaquin Day # 6, chest x-ray shows resolving right pneumonia. Continue to encourage IS. Aspiration precautions.  10 Leukocytosis: Reactive to PNA and resolving with treatment. Increased to 13.1K afebrile , monitor  11. Hypokalemia: Improving with supplement--2.8-->2.8-->3.2. 3.9 on . Will continue to supplement for now. Recheck in am.  12. ABLA: . Monitor for signs of bleeding. Hgb mildly reduced at 11.7 no rx needed 13. Multiple Sclerosis: No medications? 14. H/o Depression: On Effexor and prozac.  17. H/o CAD s/p PTCA: On Lipitor  16. Lethargy: likely multifactorial due to altered sleep cycle,  Stroke, MS, pneumonia,            -appears improved in terms of sleep last night             -trazodone prn  for insomnia, is sleeping better, alert this a.m. As discussed with husband. If patient has difficulty staying awake for therapy, may need a stimulant such as methylphenidate during the day   LOS (Days) 3 A  FACE TO FACE EVALUATION WAS PERFORMED  Byanka Landrus E 02/25/2017, 8:15 AM

## 2017-02-25 NOTE — Progress Notes (Addendum)
[facility] Individual Statement of Services  Patient Name:  Kaitlin [name]  Date:  02/25/2017  Welcome to the [facility].  Our goal is to provide you with an individualized program based on your diagnosis and situation, designed to meet your specific needs.  With this comprehensive rehabilitation program, you will be expected to participate in at least 3 hours of rehabilitation therapies Monday-Friday, with modified therapy programming on the weekends.  Your rehabilitation program will include the following services:  Physical Therapy (PT), Occupational Therapy (OT), Speech Therapy (ST), 24 hour per day rehabilitation nursing, Neuropsychology, Case Management (Social Worker), Rehabilitation Medicine, Nutrition Services and Pharmacy Services  Weekly team conferences will be held on Wednesdays to discuss your progress.  Your Social Worker will talk with you frequently to get your input and to update you on team discussions.  Team conferences with you and your family in attendance may also be held.  Expected length of stay: 2 1/2 - 3 weeks  Overall anticipated outcome: Minimal assistance with moderate assistance for showering  Depending on your progress and recovery, your program may change. Your Social Worker will coordinate services and will keep you informed of any changes. Your Social Worker's name and contact numbers are listed  below.  The following services may also be recommended but are not provided by the [facility]:   Driving Evaluations  Home Health Rehabiltiation Services  Outpatient Rehabilitation Services   Arrangements will be made to provide these services after discharge if needed.  Arrangements include referral to agencies that provide these services.  Your insurance has been verified to be:  [employer] Your primary doctor is:  Dr. Chilton Greathouse  Pertinent information will be shared with your  doctor and your insurance company.  Social Worker:  Staci Acosta, LCSW  [phone] or [phone]  Information discussed with and copy given to patient by: Elvera Lennox, 02/25/2017, 8:28 AM

## 2017-02-25 NOTE — Progress Notes (Signed)
Patient rested well during the night.  No s/s of apparent distress noted.  Patient safety maintained and no needs/concerns voiced at this time.

## 2017-02-25 NOTE — Patient Care Conference (Signed)
Inpatient RehabilitationTeam Conference and Plan of Care Update Date: 02/23/2017   Time: 10:35 AM    Patient Name: Kaitlin [name]      Medical Record Number: [id number]  Date of Birth: 1946/03/19 Sex: Female         Room/Bed: 4W14C/4W14C-01 Payor Info: Payor: [employer]vertising copy[employer] MEDICARE / Plan: [facility] MEDICARE / Product Type: *No Product type* /    Admitting Diagnosis: L CVA  Admit Date/Time:  02/22/2017  5:03 PM Admission Comments: No comment available   Primary Diagnosis:  <principal problem not specified> Principal Problem: <principal problem not specified>  Patient Active Problem List   Diagnosis Date Noted  . Abnormality of gait as late effect of stroke 02/23/2017  . Dysarthria as late effect of stroke 02/23/2017  . Dysphagia as late effect of stroke 02/23/2017  . Acute ischemic left middle cerebral artery (MCA) stroke (HCC) 02/22/2017  . Paroxysmal A-fib (HCC) 02/17/2017  . Atrial fibrillation with RVR (HCC) 02/17/2017  . HLD (hyperlipidemia) 02/17/2017  . Acute hypoxemic respiratory failure (HCC)   . Stroke (cerebrum) (HCC) - Acute MCA infarct due to left CCA/ICA and MCA occlusion, s/p mechanical thrombectomy and left ICA stenting in the setting of newly diagnosed afib 02/14/2017    Expected Discharge Date: Expected Discharge Date:  (2 1/2 to 3 weeks)  Team Members Present: Physician leading conference: Dr. Claudette Laws Social Worker Present: Staci Acosta, LCSW Nurse Present: Keturah Barre, RN PT Present: Grier Rocher, PT OT Present: Johnsie Cancel, OT SLP Present: Jackalyn Lombard, SLP PPS Coordinator present : Tora Duck, RN, CRRN     Current Status/Progress Goal Weekly Team Focus  Medical   severe dysarthria, Right hemiplegia, dysphagia  maintain adequate nutrition and hydration and medical stability  initiate rehab evals   Bowel/Bladder   Patient incontinent of B/B - pure wick in place. LBM 02/14/17.  Patient to have regular b/b pattern while on IPR  PRN medications for  constipation and assess b/b needs.   Swallow/Nutrition/ Hydration   pending ST evaluation          ADL's   Max A overall  Min A overall  ADL re-training, Neuro re-ed, functional transfers, activity tolerance   Mobility   pending PT evaluation         Communication   pending ST evaluation          Safety/Cognition/ Behavioral Observations  pending ST evaluation          Pain   Patient w/ no c/o pain at this time  patient to have pain <2 while on IPR  monitor pain q shift and prn.  medicate as ordered   Skin   bruising noted to arms, legs, abdomen.  heels red and boggy. coccyx slightly red but blanchable.  skin integrity to remain intact and no new breakdown noted  assess skin q shift and prn. use barrier creams as needed.    Rehab Goals Patient on target to meet rehab goals: Yes Rehab Goals Revised: none - pt's first conference *See Care Plan and progress notes for long and short-term goals.     Barriers to Discharge  Current Status/Progress Possible Resolutions Date Resolved   Physician    Medical stability;Neurogenic Bowel & Bladder     starting therapy today  cont/initiate  CIR program      Nursing  Incontinence               PT  Inaccessible home environment;Home environment access/layout;Behavior  OT                  SLP  (History of MS and STM loss)              SW                Discharge Planning/Teaching Needs:  Pt to go home with her husband to provide care.  Husband will need family education   Team Discussion:  Pt with left MCA embolic infarct with stent placed.  Pt is on Xarelto and Plavix both.  Pt with hx of MS, but it really only impacted her short term memory.  Pt with left hemiparesis and dysarthria.  Order for [facility] due to tight heel cord.  Pt is being treated for pneumonia.  Pt is mod to max with bed mobility; max squat pivot transfers; poor awareness of deficits; only oriented to self.  Pt leans to right when standing and sequencing  and motor planning are challenging.  Pt walked 12' with max A.  Incontinent of bladder and pt needs a BM.  Revisions to Treatment Plan:  None - pt's first conference    Continued Need for Acute Rehabilitation Level of Care: The patient requires daily medical management by a physician with specialized training in physical medicine and rehabilitation for the following conditions: Daily direction of a multidisciplinary physical rehabilitation program to ensure safe treatment while eliciting the highest outcome that is of practical value to the patient.: Yes Daily medical management of patient stability for increased activity during participation in an intensive rehabilitation regime.: Yes Daily analysis of laboratory values and/or radiology reports with any subsequent need for medication adjustment of medical intervention for : Neurological problems;Nutritional problems  Ourania Hamler, [place] 02/25/2017, 7:30 AM

## 2017-02-26 ENCOUNTER — Inpatient Hospital Stay (HOSPITAL_COMMUNITY): Payer: Self-pay | Admitting: Speech Pathology

## 2017-02-26 ENCOUNTER — Inpatient Hospital Stay (HOSPITAL_COMMUNITY): Payer: Medicare Other | Admitting: Occupational Therapy

## 2017-02-26 ENCOUNTER — Inpatient Hospital Stay (HOSPITAL_COMMUNITY): Payer: Self-pay | Admitting: Physical Therapy

## 2017-02-26 ENCOUNTER — Inpatient Hospital Stay (HOSPITAL_COMMUNITY): Payer: Self-pay

## 2017-02-26 DIAGNOSIS — E876 Hypokalemia: Secondary | ICD-10-CM

## 2017-02-26 DIAGNOSIS — D72829 Elevated white blood cell count, unspecified: Secondary | ICD-10-CM

## 2017-02-26 DIAGNOSIS — R0989 Other specified symptoms and signs involving the circulatory and respiratory systems: Secondary | ICD-10-CM

## 2017-02-26 DIAGNOSIS — J690 Pneumonitis due to inhalation of food and vomit: Secondary | ICD-10-CM

## 2017-02-26 MED ORDER — PRO-STAT SUGAR FREE PO LIQD
30.0000 mL | Freq: Two times a day (BID) | ORAL | Status: DC
Start: 2017-02-26 — End: 2017-03-19
  Administered 2017-02-26 – 2017-03-19 (×42): 30 mL via ORAL
  Filled 2017-02-26 (×42): qty 30

## 2017-02-26 NOTE — Plan of Care (Signed)
Problem: RH BOWEL ELIMINATION Goal: RH STG MANAGE BOWEL WITH ASSISTANCE STG Manage Bowel with Moderate Assistance.   Outcome: Not Progressing Incontinent-total assistance

## 2017-02-26 NOTE — Progress Notes (Signed)
Physical Therapy Session Note  Patient Details  Name: Kaitlin [name] MRN: [id number] Date of Birth: 1945-08-18  Today's Date: 02/26/2017 PT Individual Time: 1106-1208 PT Individual Time Calculation (min): 62 min   Short Term Goals: Week 1:  PT Short Term Goal 1 (Week 1): Pt will perform bed mobility with min assist 75% of trials PT Short Term Goal 2 (Week 1): Pt will perform squat pivot transfer with mod assist  PT Short Term Goal 3 (Week 1): Pt will ambulate 67f with mod assist and LRAD  PT Short Term Goal 4 (Week 1): Pt will propell WC 1010fwith min assist  Week 2:     Skilled Therapeutic Interventions/Progress Updates:   Pt received sitting in WC and agreeable to PT  [facility] Squat pivot transfers instructed byt PT to the R and the L. Mod assist in both directions with increased cues for improved weight shift to the L with transfers to the L due to mild pushing tendencies. PT instructed pt in scooting EOB with min assist and max cues for sequencing and anterior weight shift.   Nustep reciprocal movement training 5 bouts x 1 minute; constant SpO2 monitoring.   Noticed Cyanotic fingertips in BUE after first bout on nustep. Supplemental O2 increased to 3L/min. SpO2 measured at 98% once increased to 3L/min and decreased cyanosis. RN made aware. After each bout, SpO2 decrease to ~84% and increased to 93% in less than 30seconds. HR noted to vary from 45 to 121 bpm throughout Nustep endurance training.   PT instructed pt in Bed mobility including supine<>sit With min assist and max cues from PT for proper sequencing and use of UE.   PT provided pt with Hemi height WC. WC mobility with L hemi technique instructed by PT x 10036fith intermittent min-supervision assist and moderate cues for proper use of LLE to steer WC. .  Marland KitchenPatient returned to room and left sitting in [facility] and all needs met.        Therapy Documentation Precautions:   Precautions Precautions: Fall Precaution Comments: R side inattention; impulsive Restrictions Weight Bearing Restrictions: No Vital Signs: Therapy Vitals Pulse Rate: 60 BP: (!) 113/44 Patient Position (if appropriate): Sitting Oxygen Therapy SpO2: 100 % O2 Device: Nasal Cannula O2 Flow Rate (L/min): 2 L/min Pain: Pain Assessment Pain Assessment: No/denies pain   See Function Navigator for Current Functional Status.   Therapy/Group: Individual Therapy  AusLorie Phenix28/2018, 1:00 PM

## 2017-02-26 NOTE — Progress Notes (Addendum)
Subjective/Complaints: Pt seen laying in bed this AM.  She slept well overnight.  She is slowed.   ROS: Limited due to cognition, but appears to deny CP, SOB, N/V/D.  Objective: Vital Signs: Blood pressure (!) 113/44, pulse 60, temperature 98.1 F (36.7 C), temperature source Oral, resp. rate 16, weight 53.1 kg (117 lb), SpO2 100 %. No results found. Results for orders placed or performed during the hospital encounter of 02/22/17 (from the past 72 hour(s))  Urinalysis, Routine w reflex microscopic     Status: Abnormal   Collection Time: 02/23/17  5:27 PM  Result Value Ref Range   Color, Urine YELLOW YELLOW   APPearance HAZY (A) CLEAR   Specific Gravity, Urine 1.014 1.005 - 1.030   pH 6.0 5.0 - 8.0   Glucose, UA NEGATIVE NEGATIVE mg/dL   Hgb urine dipstick LARGE (A) NEGATIVE   Bilirubin Urine NEGATIVE NEGATIVE   Ketones, ur NEGATIVE NEGATIVE mg/dL   Protein, ur 30 (A) NEGATIVE mg/dL   Nitrite NEGATIVE NEGATIVE   Leukocytes, UA NEGATIVE NEGATIVE   RBC / HPF TOO NUMEROUS TO COUNT 0 - 5 RBC/hpf   WBC, UA 6-30 0 - 5 WBC/hpf   Bacteria, UA RARE (A) NONE SEEN   Squamous Epithelial / LPF NONE SEEN NONE SEEN   Mucous PRESENT    Hyaline Casts, UA PRESENT   Urine Culture     Status: None   Collection Time: 02/23/17  5:28 PM  Result Value Ref Range   Specimen Description URINE, CATHETERIZED    Special Requests levaquin    Culture NO GROWTH    Report Status 02/25/2017 FINAL      HEENT: Normocephalic, atraumatic. Cardio: RRR and no JVD. Resp: CTA B/L and Unlabored. +Espy. GI: BS positive ND Skin:   Intact. Warm and dry Neuro: Lethargic, Confused,  Motor: 3-/5 RUE 2-/5 RLE  Dysarthria Musc/Skel:  No tenderness, no edema Gen NAD. Vital signs reivewed   Assessment/Plan: 1. Functional deficits secondary to Left MCA infarct which require 3+ hours per day of interdisciplinary therapy in a comprehensive inpatient rehab setting. Physiatrist is providing close team supervision and 24  hour management of active medical problems listed below. Physiatrist and rehab team continue to assess barriers to discharge/monitor patient progress toward functional and medical goals. FIM: Function - Bathing Position: Shower Body parts bathed by patient: Chest, Abdomen, Left upper leg, Front perineal area, Right arm Body parts bathed by helper: Left arm, Right upper leg, Buttocks, Right lower leg, Back, Left lower leg  Function- Upper Body Dressing/Undressing What is the patient wearing?: Pull over shirt/dress Bra - Perfomed by helper: Thread/unthread right bra strap, Thread/unthread left bra strap, Hook/unhook bra (pull down sports bra) Pull over shirt/dress - Perfomed by patient: Put head through opening, Thread/unthread left sleeve Pull over shirt/dress - Perfomed by helper: Pull shirt over trunk, Thread/unthread right sleeve Assist Level:  (mod a) Function - Lower Body Dressing/Undressing What is the patient wearing?: Pants Position: Sitting EOB Pants- Performed by helper: Thread/unthread right pants leg, Thread/unthread left pants leg, Pull pants up/down Non-skid slipper socks- Performed by patient: Don/doff right sock, Don/doff left sock Non-skid slipper socks- Performed by helper: Don/doff right sock, Don/doff left sock Assist for footwear: Dependant Assist for lower body dressing:  (total)  Function - Toileting Toileting activity did not occur: No continent bowel/bladder event Toileting steps completed by helper: Adjust clothing prior to toileting, Performs perineal hygiene, Adjust clothing after toileting Toileting Assistive Devices: Grab bar or rail Assist level: Two helpers  Function - Archivist transfer assistive device: Bedside commode Assist level to toilet: Maximal assist (Pt 25 - 49%/lift and lower) Assist level from toilet: Maximal assist (Pt 25 - 49%/lift and lower) Assist level to bedside commode (at bedside): 2 helpers (1 helper to assist with  hygiene or clothing manipulation) Assist level from bedside commode (at bedside): 2 helpers  Function - Chair/bed transfer Chair/bed transfer method: Stand pivot Chair/bed transfer assist level: Moderate assist (Pt 50 - 74%/lift or lower)  Function - Locomotion: Wheelchair Will patient use wheelchair at discharge?: Yes Type: Manual Max wheelchair distance: 71ft  Assist Level: Moderate assistance (Pt 50 - 74%) Assist Level: Moderate assistance (Pt 50 - 74%) Wheel 150 feet activity did not occur: Safety/medical concerns Function - Locomotion: Ambulation Assistive device: Hand held assist Max distance: 12 Assist level: Maximal assist (Pt 25 - 49%) Assist level: Maximal assist (Pt 25 - 49%) Walk 50 feet with 2 turns activity did not occur: Safety/medical concerns Walk 150 feet activity did not occur: Safety/medical concerns Walk 10 feet on uneven surfaces activity did not occur: Safety/medical concerns  Function - Comprehension Comprehension: Auditory Comprehension assist level: Understands basic 50 - 74% of the time/ requires cueing 25 - 49% of the time  Function - Expression Expression: Verbal Expression assist level: Expresses basic 25 - 49% of the time/requires cueing 50 - 75% of the time. Uses single words/gestures.  Function - Social Interaction Social Interaction assist level: Interacts appropriately 25 - 49% of time - Needs frequent redirection.  Function - Problem Solving Problem solving assist level: Solves basic 25 - 49% of the time - needs direction more than half the time to initiate, plan or complete simple activities  Function - Memory Memory assist level: Recognizes or recalls 25 - 49% of the time/requires cueing 50 - 75% of the time Patient normally able to recall (first 3 days only): None of the above  Medical Problem List and Plan: 1.  Right hemiparesis and language deficits secondary to left MCA infarct             -Cont CIR   Notes reviewed, images  reviewed 2.  DVT Prophylaxis/Anticoagulation: Pharmaceutical: Xarelto and Plavix, high risk of bleeding, monitor 3. Pain Management: tylenol prn 4. Mood: LCSW to follow for evaluation and support. Sleep/wake cycle  5. Neuropsych: This patient is not capable of making decisions on her own behalf. 6. Skin/Wound Care: Routine pressure relief measures.  7. Fluids/Electrolytes/Nutrition: Monitor I/O. Normal BUN and creatinine on 02/23/2017, continue to monitor intake 8. A fib: Monitor HR bid. Continue amiodarone and metoprolol for HR control.   9. Aspiration PNA: Continue Levaquin Day # 7, chest x-ray reviewed, showing improving right pneumonia. Continue to encourage IS. Aspiration precautions.  10 Leukocytosis: Reactive to PNA and resolving with treatment. Increased to 13.1K on 7/25 afebrile   Ucx No growth  Cont to monitor 11. Hypokalemia: Improving with supplement. Will continue to supplement for now.   K+ 3.9 on 7/25  Cont to monitor 12. ABLA: . Monitor for signs of bleeding. Hgb mildly reduced at 11.7 no rx needed 13. Multiple Sclerosis: No medications? 14. H/o Depression: On Effexor and prozac.  15. H/o CAD s/p PTCA: On Lipitor  16. Lethargy: likely multifactorial due to altered sleep cycle,  Stroke, MS, pneumonia, appears improved in terms of sleep last night             -trazodone prn  for insomnia, is sleeping better 17. Labile blood pressure  Cont  to monitor 18. Hypoalbuminemia  Supplement initiated 7/28  LOS (Days) 4 A FACE TO FACE EVALUATION WAS PERFORMED  Artisha Capri Karis Juba 02/26/2017, 10:59 AM

## 2017-02-26 NOTE — Progress Notes (Signed)
Occupational Therapy Session Note  Patient Details  Name: Kaitlin [name] MRN: [id number] Date of Birth: July 13, 1946  Today's Date: 02/26/2017 OT Individual Time: 7867-6720 OT Individual Time Calculation (min): 58 min    Short Term Goals: Week 1:  OT Short Term Goal 1 (Week 1): Pt will recall hemi dressing techniques with mod multimodal cuing OT Short Term Goal 2 (Week 1): Pt will completed 1/3 toileting tasks with steadying assist OT Short Term Goal 3 (Week 1): Pt will attend to R side of body during bathing task with min cuing.  OT Short Term Goal 4 (Week 1): Pt will don pants with mod A  Skilled Therapeutic Interventions/Progress Updates:    Upon entering the room, pt in bed with HOB elevated eating meal with husband present in the room. Pt agreeable to sitting on EOB to complete meal. Pt required set up A of meal tray by opening containers and pt seated on EOB with close supervision - min guard for safety. Pt needing verbal cues for small bites and to finish chewing and swallow before placing more food into mouth. Session focused on hemiplegic dressing techniques while seated on EOB with max instructional and demonstrational cuing needed. Pt required mod A for UB and total A for LB dressing. Stand pivot transfer with mod A to wheelchair at end of session. Pt seated in wheelchair at sink for grooming tasks with set up A and mod multimodal cues for technique. Pt remained in wheelchair with quick release belt donned and lap tray for safety. Husband remains present in room.   Therapy Documentation Precautions:  Precautions Precautions: Fall Precaution Comments: R side inattention; impulsive Restrictions Weight Bearing Restrictions: No General:   Vital Signs: Therapy Vitals Temp: 98.1 F (36.7 C) Temp Source: Oral Pulse Rate: (!) 50 Resp: 16 BP: (!) 82/40 Patient Position (if appropriate): Sitting  See Function Navigator for Current Functional Status.   Therapy/Group: Individual  Therapy  Alen Bleacher 02/26/2017, 9:01 AM

## 2017-02-26 NOTE — Progress Notes (Signed)
Occupational Therapy Session Note  Patient Details  Name: Kaitlin [name] MRN: [id number] Date of Birth: [birth year]  Today's Date: 02/26/2017 OT Individual Time: 1300-1345 OT Individual Time Calculation (min): 45 min    Short Term Goals: Week 1:  OT Short Term Goal 1 (Week 1): Pt will recall hemi dressing techniques with mod multimodal cuing OT Short Term Goal 2 (Week 1): Pt will completed 1/3 toileting tasks with steadying assist OT Short Term Goal 3 (Week 1): Pt will attend to R side of body during bathing task with min cuing.  OT Short Term Goal 4 (Week 1): Pt will don pants with mod A  Skilled Therapeutic Interventions/Progress Updates:    1:1. Pt seen eating lunch requesting to continue to eat with husband present providing supervision. OT provides red foam handle and places it on utensil for forced use of RUE to eat part of meal with MOD A for completing hand to mouth excursion with RUE/manipulation of utensil. Ot propels w/c to/from all tx destinations for time management. Pt sits with supervision to obtain horseshoes reaching laterally and pitching at target. Pt stands to complete same horseshoe activity with MOD A for lifting and VC for hand placement, weight shifting to L, knee extension and safety awareness. In sitting pt reaches with RUE with MOD A to grasp horse shoes and place in bucket to clean up. Exited session with pt seated in w/c with call light in reach, QRB donned, all needs met and husband present in room.  Therapy Documentation Precautions:  Precautions Precautions: Fall Precaution Comments: R side inattention; impulsive Restrictions Weight Bearing Restrictions: No General:    See Function Navigator for Current Functional Status.   Therapy/Group: Individual Therapy  Tonny Branch 02/26/2017, 2:27 PM

## 2017-02-26 NOTE — Progress Notes (Signed)
Speech Language Pathology Daily Session Note  Patient Details  Name: Kaitlin [name] MRN: [id number] Date of Birth: 1946-04-15  Today's Date: 02/26/2017 SLP Individual Time: 0935-1000 SLP Individual Time Calculation (min): 25 min  Short Term Goals: Week 1: SLP Short Term Goal 1 (Week 1): Pt will utilize word finding strategies to name common objects with Mod A cues.  SLP Short Term Goal 2 (Week 1): Pt will utilize external memory aids to recall dialy information (to include orientation information) with Mod A cues.  SLP Short Term Goal 3 (Week 1): Pt will sustain attention to basic functional task for ~ 15 minutes with Mod A. SLP Short Term Goal 4 (Week 1): Pt will complete basic familiar task during ADLs with Mod A cues.  SLP Short Term Goal 5 (Week 1): Pt will consume trials of dysphagia 3 without overt s/s of oral phase dysphagia with Min A cues for use of compensatory swallow strategies.  SLP Short Term Goal 6 (Week 1): Pt will utilize speech intelligibility strategies to achieve ~ 75% intelligibility at the word level with Mod A cues.   Skilled Therapeutic Interventions:  Pt was seen for skilled ST targeting cognitive goals.  Pt needed max assist/choice of two to identify that a therapist helped her get dressed this morning.  With the use of her memory notebook and max assist verbal and visual cues, pt was able to recall 3 specific details of AM OT therapy session.  Pt needed max assist verbal and visual cues for use of calendar to reorient to date.  Calendar was posted within pt's line of sight in pt's room to maximize carryover in between therapy sessions.  Therapist recorded 2 activities from today's therapy session in memory notebook and had pt practice locating information to continue demonstration and instruction in its use to improve pt and husband's independence for carryover outside of structured treatment.  Pt was left in chair with husband at bedside.  Continue per current plan of  care.    Function:  Eating Eating   Modified Consistency Diet: Yes Eating Assist Level: Set up assist for;Supervision or verbal cues   Eating Set Up Assist For: Opening containers       Cognition Comprehension Comprehension assist level: Understands basic 50 - 74% of the time/ requires cueing 25 - 49% of the time  Expression   Expression assist level: Expresses basic 25 - 49% of the time/requires cueing 50 - 75% of the time. Uses single words/gestures.  Social Interaction Social Interaction assist level: Interacts appropriately 25 - 49% of time - Needs frequent redirection.  Problem Solving Problem solving assist level: Solves basic 25 - 49% of the time - needs direction more than half the time to initiate, plan or complete simple activities  Memory Memory assist level: Recognizes or recalls 25 - 49% of the time/requires cueing 50 - 75% of the time    Pain Pain Assessment Pain Assessment: No/denies pain  Therapy/Group: Individual Therapy  Kaitlin [name], Melanee Spry 02/26/2017, 10:02 AM

## 2017-02-27 ENCOUNTER — Inpatient Hospital Stay (HOSPITAL_COMMUNITY): Payer: Self-pay | Admitting: Speech Pathology

## 2017-02-27 DIAGNOSIS — G8191 Hemiplegia, unspecified affecting right dominant side: Secondary | ICD-10-CM

## 2017-02-27 NOTE — Plan of Care (Signed)
Problem: RH BLADDER ELIMINATION Goal: RH STG MANAGE BLADDER WITH ASSISTANCE STG Manage Bladder With max Assistance    Outcome: Not Progressing Incontinent-total assist   

## 2017-02-27 NOTE — Progress Notes (Addendum)
Subjective/Complaints: Pt seen laying in bed this AM.  She slept well overnight.  She smiles when notified she does not have any therapies today.   ROS: Limited due to cognition, but appears to deny CP, SOB, N/V/D.  Objective: Vital Signs: Blood pressure (!) 128/55, pulse 62, temperature 98.6 F (37 C), temperature source Oral, resp. rate 18, weight 53.1 kg (117 lb), SpO2 100 %. No results found. No results found for this or any previous visit (from the past 72 hour(s)).   HEENT: Normocephalic, atraumatic. Cardio: RRR and no JVD. +Murmur Resp: CTA B/L and Unlabored. +[place]. GI: BS positive ND Skin:   Intact. Warm and dry Neuro: Lethargic, Confused,  Motor: 3-/5 RUE (stable) 2-/5 RLE  Dysarthria Musc/Skel:  No tenderness, no edema Gen NAD. Vital signs reivewed   Assessment/Plan: 1. Functional deficits secondary to Left MCA infarct which require 3+ hours per day of interdisciplinary therapy in a comprehensive inpatient rehab setting. Physiatrist is providing close team supervision and 24 hour management of active medical problems listed below. Physiatrist and rehab team continue to assess barriers to discharge/monitor patient progress toward functional and medical goals. FIM: Function - Bathing Position: Shower Body parts bathed by patient: Chest, Abdomen, Left upper leg, Front perineal area, Right arm Body parts bathed by helper: Left arm, Right upper leg, Buttocks, Right lower leg, Back, Left lower leg  Function- Upper Body Dressing/Undressing What is the patient wearing?: Pull over shirt/dress Bra - Perfomed by helper: Thread/unthread right bra strap, Thread/unthread left bra strap, Hook/unhook bra (pull down sports bra) Pull over shirt/dress - Perfomed by patient: Put head through opening, Thread/unthread left sleeve Pull over shirt/dress - Perfomed by helper: Pull shirt over trunk, Thread/unthread right sleeve Assist Level:  (mod a) Function - Lower Body  Dressing/Undressing What is the patient wearing?: Pants Position: Sitting EOB Pants- Performed by helper: Thread/unthread right pants leg, Thread/unthread left pants leg, Pull pants up/down Non-skid slipper socks- Performed by patient: Don/doff right sock, Don/doff left sock Non-skid slipper socks- Performed by helper: Don/doff right sock, Don/doff left sock Assist for footwear: Dependant Assist for lower body dressing:  (total)  Function - Toileting Toileting activity did not occur: No continent bowel/bladder event Toileting steps completed by helper: Adjust clothing prior to toileting, Performs perineal hygiene, Adjust clothing after toileting Toileting Assistive Devices: Grab bar or rail Assist level: Two helpers  Function - Archivist transfer assistive device: Bedside commode Assist level to toilet: Maximal assist (Pt 25 - 49%/lift and lower) Assist level from toilet: Maximal assist (Pt 25 - 49%/lift and lower) Assist level to bedside commode (at bedside): 2 helpers (1 helper to assist with hygiene or clothing manipulation) Assist level from bedside commode (at bedside): 2 helpers  Function - Chair/bed transfer Chair/bed transfer method: Squat pivot Chair/bed transfer assist level: Moderate assist (Pt 50 - 74%/lift or lower)  Function - Locomotion: Wheelchair Will patient use wheelchair at discharge?: Yes Type: Manual Max wheelchair distance: 170ft  Assist Level: Touching or steadying assistance (Pt > 75%) Assist Level: Touching or steadying assistance (Pt > 75%) Wheel 150 feet activity did not occur: Safety/medical concerns Function - Locomotion: Ambulation Assistive device: Hand held assist Max distance: 12 Assist level: Maximal assist (Pt 25 - 49%) Assist level: Maximal assist (Pt 25 - 49%) Walk 50 feet with 2 turns activity did not occur: Safety/medical concerns Walk 150 feet activity did not occur: Safety/medical concerns Walk 10 feet on uneven surfaces  activity did not occur: Safety/medical concerns  Function -  Comprehension Comprehension: Auditory Comprehension assist level: Understands basic 50 - 74% of the time/ requires cueing 25 - 49% of the time  Function - Expression Expression: Verbal Expression assist level: Expresses basic 25 - 49% of the time/requires cueing 50 - 75% of the time. Uses single words/gestures.  Function - Social Interaction Social Interaction assist level: Interacts appropriately 25 - 49% of time - Needs frequent redirection.  Function - Problem Solving Problem solving assist level: Solves basic 25 - 49% of the time - needs direction more than half the time to initiate, plan or complete simple activities  Function - Memory Memory assist level: Recognizes or recalls 25 - 49% of the time/requires cueing 50 - 75% of the time Patient normally able to recall (first 3 days only): None of the above  Medical Problem List and Plan: 1.  Right hemiparesis and language deficits secondary to left MCA infarct  Cont CIR  2.  DVT Prophylaxis/Anticoagulation: Pharmaceutical: Xarelto and Plavix, high risk of bleeding, monitor 3. Pain Management: tylenol prn 4. Mood: LCSW to follow for evaluation and support. Sleep/wake cycle  5. Neuropsych: This patient is not capable of making decisions on her own behalf. 6. Skin/Wound Care: Routine pressure relief measures.  7. Fluids/Electrolytes/Nutrition: Monitor I/O. Normal BUN and creatinine on 02/23/2017, continue to monitor intake 8. A fib: Monitor HR bid. Continue amiodarone and metoprolol for HR control.   9. Aspiration PNA: Continue Levaquin d/ced 7/29, chest x-ray reviewed, showing improving right pneumonia. Continue to encourage IS. Aspiration precautions.  10 Leukocytosis: Reactive to PNA and resolving with treatment. Increased to 13.1K on 7/25 afebrile   Ucx No growth  Cont to monitor 11. Hypokalemia: Improving with supplement. Will continue to supplement for now.   K+ 3.9  on 7/25  Cont to monitor 12. ABLA: . Monitor for signs of bleeding. Hgb mildly reduced at 11.7 no rx needed 13. Multiple Sclerosis: No medications? 14. H/o Depression: On Effexor and prozac.  15. H/o CAD s/p PTCA: On Lipitor  16. Lethargy: likely multifactorial due to altered sleep cycle,  Stroke, MS, pneumonia, appears improved in terms of sleep last night             -trazodone prn  for insomnia, is sleeping better 17. Labile blood pressure  Cont to monitor  Controlled 7/29 18. Hypoalbuminemia  Supplement initiated 7/28  LOS (Days) 5 A FACE TO FACE EVALUATION WAS PERFORMED  Mary-Ann Pennella Karis Juba 02/27/2017, 8:55 AM

## 2017-02-28 ENCOUNTER — Inpatient Hospital Stay (HOSPITAL_COMMUNITY): Payer: Medicare Other | Admitting: Speech Pathology

## 2017-02-28 ENCOUNTER — Inpatient Hospital Stay (HOSPITAL_COMMUNITY): Payer: Self-pay | Admitting: Physical Therapy

## 2017-02-28 ENCOUNTER — Inpatient Hospital Stay (HOSPITAL_COMMUNITY): Payer: Self-pay | Admitting: Occupational Therapy

## 2017-02-28 NOTE — Progress Notes (Signed)
O2 sats 100% on RA during night.Kaitlin [name]

## 2017-02-28 NOTE — Progress Notes (Signed)
Occupational Therapy Session Note  Patient Details  Name: Kaitlin [name] MRN: [id number] Date of Birth: [birth year]  Today's Date: 02/28/2017 OT Individual Time: 7616-0737 OT Individual Time Calculation (min): 75 min    Short Term Goals: Week 1:  OT Short Term Goal 1 (Week 1): Pt will recall hemi dressing techniques with mod multimodal cuing OT Short Term Goal 2 (Week 1): Pt will completed 1/3 toileting tasks with steadying assist OT Short Term Goal 3 (Week 1): Pt will attend to R side of body during bathing task with min cuing.  OT Short Term Goal 4 (Week 1): Pt will don pants with mod A  Skilled Therapeutic Interventions/Progress Updates:    Pt seen for OT session focusing on ADL re-training and functional use of R UE. Pt in supine upon arrival, agreeable to tx session. Addressed orientation, pt oriented to self only. Once re-oriented she was unable to immediately recall orientation facts.  She transferred to EOB with min A following VCs for technique. Bathing deffered this ession as it fatigues pt quickly and she has full morning of therapies, will schedule differently for tomorrow and pt's husband made aware. Seated EOB, pt dressed with min A for dynamic sitting balance and total A cues and max assist to attend to R side of body during dressing task. Pt able to correctly name 50% of clothing item. She stood with mod A to stand and max- total A standing balance due to R lean. Pt intiiated supine rest break following dressing task.  Grooming task s completed from w/c level at sink, pt able to locate all needed items on sink ledge R of midline with increased time. Mod- max A required to use R UE in functional manner due to apraxia.  Completed toileting task with total A, pt with strong R lean in standing while hygiene/ clothing management completed and required UE support and supervision for static sitting balance. From w/c level, pt completed gross grasping activity, with min A able to grasp  small self care item with R UE and pt able to achieve ~75% AROM shoulder flexion to reach towards placing item on bedside table placed to her R . Completed x4 trials with rest breaks provided throughout. Pt left seated in w/c at end of session, QRB donned and husband present.   Therapy Documentation Precautions:  Precautions Precautions: Fall Precaution Comments: R side inattention; impulsive Restrictions Weight Bearing Restrictions: No Pain:    No/ denies pain  See Function Navigator for Current Functional Status.   Therapy/Group: Individual Therapy  Lewis, Erielle Gawronski C 02/28/2017, 7:20 AM

## 2017-02-28 NOTE — Progress Notes (Signed)
Speech Language Pathology Daily Session Note  Patient Details  Name: Kaitlin [name] MRN: [id number] Date of Birth: [birth year]  Today's Date: 02/28/2017 SLP Individual Time: 0730-0830 SLP Individual Time Calculation (min): 60 min  Short Term Goals: Week 1: SLP Short Term Goal 1 (Week 1): Pt will utilize word finding strategies to name common objects with Mod A cues.  SLP Short Term Goal 2 (Week 1): Pt will utilize external memory aids to recall dialy information (to include orientation information) with Mod A cues.  SLP Short Term Goal 3 (Week 1): Pt will sustain attention to basic functional task for ~ 15 minutes with Mod A. SLP Short Term Goal 4 (Week 1): Pt will complete basic familiar task during ADLs with Mod A cues.  SLP Short Term Goal 5 (Week 1): Pt will consume trials of dysphagia 3 without overt s/s of oral phase dysphagia with Min A cues for use of compensatory swallow strategies.  SLP Short Term Goal 6 (Week 1): Pt will utilize speech intelligibility strategies to achieve ~ 75% intelligibility at the word level with Mod A cues.   Skilled Therapeutic Interventions: Skilled treatment session focused on dysphagia and cognition goals. SLP facilitated session by providing trials of advanced diet textures (diced peaches and graham crackers). Pt with min overt s/s of aspiration as evidenced by consistent throat clear with each bolus of graham crackers. Pt required Max to Mod cognitive reminders not to talk with food in mouth.. Pt also required Max A multimodal cues for task initiation, sustained attention to basic task for ~ 5 minutes. Pt's memory book present and pt unable to read information. She was able to follow visual prompt to locate information but then appeared lost and unable to know what to do. Pt able to list days of the week with SLP model but unable to state or read written day of the week. Pt unable to trace orientation information. Pt left upright in bed, husband present and all  needs within reach. Continue per current plan of care.      Function:  Eating Eating   Modified Consistency Diet: Yes Eating Assist Level: Set up assist for;Supervision or verbal cues   Eating Set Up Assist For: Opening containers       Cognition Comprehension Comprehension assist level: Understands basic 50 - 74% of the time/ requires cueing 25 - 49% of the time (Simple directions)  Expression   Expression assist level: Expresses basic 25 - 49% of the time/requires cueing 50 - 75% of the time. Uses single words/gestures.  Social Interaction Social Interaction assist level: Interacts appropriately 50 - 74% of the time - May be physically or verbally inappropriate.  Problem Solving Problem solving assist level: Solves basic 25 - 49% of the time - needs direction more than half the time to initiate, plan or complete simple activities  Memory Memory assist level: Recognizes or recalls 25 - 49% of the time/requires cueing 50 - 75% of the time    Pain    Therapy/Group: Individual Therapy  [name] 02/28/2017, 9:54 AM

## 2017-02-28 NOTE — Progress Notes (Signed)
Physical Therapy Session Note  Patient Details  Name: Kaitlin [name] MRN: [id number] Date of Birth: 17-Mar-1946  Today's Date: 02/28/2017 PT Individual Time: 1100-1200 PT Individual Time Calculation (min): 60 min   Short Term Goals: Week 1:  PT Short Term Goal 1 (Week 1): Pt will perform bed mobility with min assist 75% of trials PT Short Term Goal 2 (Week 1): Pt will perform squat pivot transfer with mod assist  PT Short Term Goal 3 (Week 1): Pt will ambulate 34ft with mod assist and LRAD  PT Short Term Goal 4 (Week 1): Pt will propell WC 130ft with min assist   Skilled Therapeutic Interventions/Progress Updates: Pt presented in w/c agreeable to therapy. Pt not orientated to date or location. Pt transported to rehab gym for energy conservation. Pt performed squat pivot R to mat modA. Practiced sit to stand with RW minA x 5 with cues for hand placement. Required tactile cues for posture and R knee extension. Performed RLE advancing and returning x 5 with mirror feedback. Pt demonstrated no signs of dyspnea however fingertips cyanotic. SpO2 checked intermittently lowest noted 89%. Pt transported to hallway. Initiated gait training at wall with modA 61ft, 37ft with seated rest. Tactile cues for quad activation, posture, wt shifting, and minA for R foot placement. Performed w/c mobility 12ft modA using hemi-technique with occasional hand over hand feedback for technique. Pt remained in w/c at end of session with QRB and half lap tray in place and NT notified due to lunch being served.      Therapy Documentation Precautions:  Precautions Precautions: Fall Precaution Comments: R side inattention; impulsive Restrictions Weight Bearing Restrictions: No General:   Vital Signs: Therapy Vitals Temp: (!) 97.5 F (36.4 C) Temp Source: Oral Pulse Rate: (!) 51 Resp: 18 BP: (!) 125/96 Patient Position (if appropriate): Sitting Oxygen Therapy SpO2: 96 % O2 Device: Not Delivered   See Function  Navigator for Current Functional Status.   Therapy/Group: Individual Therapy  Thaddus Mcdowell  Rheda Kassab, PTA  02/28/2017, 4:35 PM

## 2017-02-28 NOTE — Progress Notes (Signed)
Subjective/Complaints: Patient has finished dressing program this morning. Low endurance. Remains confused, not oriented to place or time   ROS: Limited due to cognition, but appears to deny CP, SOB, N/V/D.  Objective: Vital Signs: Blood pressure (!) 163/50, pulse (!) 56, temperature 98.5 F (36.9 C), temperature source Oral, resp. rate 17, weight 53.1 kg (117 lb), SpO2 100 %. No results found. No results found for this or any previous visit (from the past 72 hour(s)).   HEENT: Normocephalic, atraumatic. Cardio: RRR and no JVD. +Murmur Resp: CTA B/L and Unlabored. +[place]. GI: BS positive ND Skin:   Intact. Warm and dry Neuro: Lethargic, Confused,  Motor: 3-/5 RUE (stable) 2-/5 RLE  Dysarthria Musc/Skel:  No tenderness, no edema Gen NAD. Vital signs reivewed   Assessment/Plan: 1. Functional deficits secondary to Left MCA infarct which require 3+ hours per day of interdisciplinary therapy in a comprehensive inpatient rehab setting. Physiatrist is providing close team supervision and 24 hour management of active medical problems listed below. Physiatrist and rehab team continue to assess barriers to discharge/monitor patient progress toward functional and medical goals. FIM: Function - Bathing Position: Shower Body parts bathed by patient: Chest, Abdomen, Left upper leg, Front perineal area, Right arm Body parts bathed by helper: Left arm, Right upper leg, Buttocks, Right lower leg, Back, Left lower leg  Function- Upper Body Dressing/Undressing What is the patient wearing?: Pull over shirt/dress Bra - Perfomed by helper: Thread/unthread right bra strap, Thread/unthread left bra strap, Hook/unhook bra (pull down sports bra) Pull over shirt/dress - Perfomed by patient: Put head through opening, Thread/unthread left sleeve Pull over shirt/dress - Perfomed by helper: Pull shirt over trunk, Thread/unthread right sleeve Assist Level:  (mod a) Function - Lower Body  Dressing/Undressing What is the patient wearing?: Pants Position: Sitting EOB Pants- Performed by helper: Thread/unthread right pants leg, Thread/unthread left pants leg, Pull pants up/down Non-skid slipper socks- Performed by patient: Don/doff right sock, Don/doff left sock Non-skid slipper socks- Performed by helper: Don/doff right sock, Don/doff left sock Assist for footwear: Dependant Assist for lower body dressing:  (total)  Function - Toileting Toileting activity did not occur: No continent bowel/bladder event Toileting steps completed by helper: Adjust clothing prior to toileting, Performs perineal hygiene, Adjust clothing after toileting Toileting Assistive Devices: Grab bar or rail Assist level: Two helpers  Function - Archivist transfer assistive device: Bedside commode Assist level to toilet: Moderate assist (Pt 50 - 74%/lift or lower) Assist level from toilet: Moderate assist (Pt 50 - 74%/lift or lower) Assist level to bedside commode (at bedside): 2 helpers (1 helper to assist with hygiene or clothing manipulation) Assist level from bedside commode (at bedside): 2 helpers  Function - Chair/bed transfer Chair/bed transfer method: Squat pivot Chair/bed transfer assist level: Moderate assist (Pt 50 - 74%/lift or lower)  Function - Locomotion: Wheelchair Will patient use wheelchair at discharge?: Yes Type: Manual Max wheelchair distance: 156ft  Assist Level: Touching or steadying assistance (Pt > 75%) Assist Level: Touching or steadying assistance (Pt > 75%) Wheel 150 feet activity did not occur: Safety/medical concerns Function - Locomotion: Ambulation Assistive device: Hand held assist Max distance: 12 Assist level: Maximal assist (Pt 25 - 49%) Assist level: Maximal assist (Pt 25 - 49%) Walk 50 feet with 2 turns activity did not occur: Safety/medical concerns Walk 150 feet activity did not occur: Safety/medical concerns Walk 10 feet on uneven surfaces  activity did not occur: Safety/medical concerns  Function - Comprehension Comprehension: Auditory Comprehension assist level:  Understands basic 50 - 74% of the time/ requires cueing 25 - 49% of the time  Function - Expression Expression: Verbal Expression assist level: Expresses basic 25 - 49% of the time/requires cueing 50 - 75% of the time. Uses single words/gestures.  Function - Social Interaction Social Interaction assist level: Interacts appropriately 50 - 74% of the time - May be physically or verbally inappropriate.  Function - Problem Solving Problem solving assist level: Solves basic 25 - 49% of the time - needs direction more than half the time to initiate, plan or complete simple activities  Function - Memory Memory assist level: Recognizes or recalls 25 - 49% of the time/requires cueing 50 - 75% of the time Patient normally able to recall (first 3 days only): None of the above  Medical Problem List and Plan: 1.  Right hemiparesis and language deficits secondary to left MCA infarct  Cont CIR  2.  DVT Prophylaxis/Anticoagulation: Pharmaceutical: Xarelto and Plavix, high risk of bleeding, monitor 3. Pain Management: tylenol prn 4. Mood: LCSW to follow for evaluation and support. Sleep/wake cycle  5. Neuropsych: This patient is not capable of making decisions on her own behalf. 6. Skin/Wound Care: Routine pressure relief measures.  7. Fluids/Electrolytes/Nutrition: Monitor I/O. Normal BUN and creatinine on 02/23/2017, continue to monitor intake, variable meal intake 5-80% over the last several days 8. A fib: Monitor HR bid. Continue amiodarone and metoprolol for HR control.   9. Aspiration WUJ:[id number], afebrile  Levaquin d/ced 7/29, chest x-ray reviewed, showing improving right pneumonia. Continue to encourage IS. Aspiration precautions.  10 Leukocytosis: Reactive to PNA and resolving with treatment. Increased to 13.1K on 7/25 afebrile   Ucx No growth  Cont to monitor 11.  Hypokalemia: Improving with supplement. Will continue to supplement for now.   K+ 3.9 on 7/25  Cont to monitor 12. ABLA: . Monitor for signs of bleeding. Hgb mildly reduced at 11.7 no rx needed 13. Multiple Sclerosis: No medications? 14. H/o Depression: On Effexor and prozac.  15. H/o CAD s/p PTCA: On Lipitor  16. Lethargy: likely multifactorial due to altered sleep cycle,  Stroke, MS, pneumonia, appears improved in terms of sleep last night             -trazodone prn  for insomnia, is sleeping better 17. Labile blood pressure no further med adjustments for now   Vitals:   02/27/17 1955 02/28/17 0454  BP: (!) 125/51 (!) 163/50  Pulse: 66 (!) 56  Resp:  17  Temp:  98.5 F (36.9 C)   18. Hypoalbuminemia  Supplement initiated 7/28  LOS (Days) 6 A FACE TO FACE EVALUATION WAS PERFORMED  KIRSTEINS,ANDREW E 02/28/2017, 8:50 AM

## 2017-02-28 NOTE — Progress Notes (Signed)
Slept good during night. No attempts OOB, bed alarm in place. Pitting edema to RLE and BUE's. Continent at [facility] with timed toileting every 4 hours.

## 2017-02-28 NOTE — Progress Notes (Signed)
Speech Language Pathology Daily Session Note  Patient Details  Name: Kaitlin [name] MRN: [id number] Date of Birth: Mar 21, 1946  Today's Date: 02/28/2017 SLP Individual Time: 1300-1345 SLP Individual Time Calculation (min): 45 min  Short Term Goals: Week 1: SLP Short Term Goal 1 (Week 1): Pt will utilize word finding strategies to name common objects with Mod A cues.  SLP Short Term Goal 2 (Week 1): Pt will utilize external memory aids to recall dialy information (to include orientation information) with Mod A cues.  SLP Short Term Goal 3 (Week 1): Pt will sustain attention to basic functional task for ~ 15 minutes with Mod A. SLP Short Term Goal 4 (Week 1): Pt will complete basic familiar task during ADLs with Mod A cues.  SLP Short Term Goal 5 (Week 1): Pt will consume trials of dysphagia 3 without overt s/s of oral phase dysphagia with Min A cues for use of compensatory swallow strategies.  SLP Short Term Goal 6 (Week 1): Pt will utilize speech intelligibility strategies to achieve ~ 75% intelligibility at the word level with Mod A cues.   Skilled Therapeutic Interventions: Skilled treatment session focused on dysphagia and language goals. SLP facilitated session by providing skilled observation with lunch meal of Dys. 2 textures with thin liquids with supervision verbal cues needed for small  bites/sips and a slow rate of self-feeding. Patient with overt cough X 1, suspect due to mixed consistencies. Recommend patient continue current diet. Patient named functional items with 90% accuracy and Min-Mod A verbal cues and matched a written phrase to an object from a field of 2 with 75% accuracy. Patient spontaneously verbalized her wants/needs at the phrase level with Mod I. Patient left upright in wheelchair at RN station. Continue with current plan of care.      Function:  Eating Eating   Modified Consistency Diet: Yes Eating Assist Level: Set up assist for;Supervision or verbal cues    Eating Set Up Assist For: Opening containers       Cognition Comprehension Comprehension assist level: Understands basic 50 - 74% of the time/ requires cueing 25 - 49% of the time  Expression   Expression assist level: Expresses basic 25 - 49% of the time/requires cueing 50 - 75% of the time. Uses single words/gestures.  Social Interaction Social Interaction assist level: Interacts appropriately 50 - 74% of the time - May be physically or verbally inappropriate.  Problem Solving Problem solving assist level: Solves basic 25 - 49% of the time - needs direction more than half the time to initiate, plan or complete simple activities  Memory Memory assist level: Recognizes or recalls 25 - 49% of the time/requires cueing 50 - 75% of the time    Pain No/Denies Pain   Therapy/Group: Individual Therapy  Giannamarie Paulus 02/28/2017, 4:30 PM

## 2017-03-01 ENCOUNTER — Inpatient Hospital Stay (HOSPITAL_COMMUNITY): Payer: Self-pay | Admitting: Occupational Therapy

## 2017-03-01 ENCOUNTER — Inpatient Hospital Stay (HOSPITAL_COMMUNITY): Payer: Self-pay | Admitting: Physical Therapy

## 2017-03-01 ENCOUNTER — Inpatient Hospital Stay (HOSPITAL_COMMUNITY): Payer: Medicare Other | Admitting: Speech Pathology

## 2017-03-01 DIAGNOSIS — R7989 Other specified abnormal findings of blood chemistry: Secondary | ICD-10-CM

## 2017-03-01 LAB — BASIC METABOLIC PANEL
Anion gap: 11 (ref 5–15)
BUN: 44 mg/dL — ABNORMAL HIGH (ref 6–20)
CALCIUM: 10.1 mg/dL (ref 8.9–10.3)
CO2: 26 mmol/L (ref 22–32)
CREATININE: 0.94 mg/dL (ref 0.44–1.00)
Chloride: 101 mmol/L (ref 101–111)
GFR, EST NON AFRICAN AMERICAN: 59 mL/min — AB (ref >60)
Glucose, Bld: 78 mg/dL (ref 65–99)
Potassium: 4.3 mmol/L (ref 3.5–5.1)
SODIUM: 138 mmol/L (ref 135–145)

## 2017-03-01 LAB — CBC
HCT: 27.4 % — ABNORMAL LOW (ref 36.0–46.0)
Hemoglobin: 9.3 g/dL — ABNORMAL LOW (ref 12.0–15.0)
MCH: 31.3 pg (ref 26.0–34.0)
MCHC: 33.9 g/dL (ref 30.0–36.0)
MCV: 92.3 fL (ref 78.0–100.0)
Platelets: 415 10*3/uL — ABNORMAL HIGH (ref 150–400)
RBC: 2.97 MIL/uL — AB (ref 3.87–5.11)
RDW: 13 % (ref 11.5–15.5)
WBC: 11.3 10*3/uL — AB (ref 4.0–10.5)

## 2017-03-01 MED ORDER — ENSURE ENLIVE PO LIQD
237.0000 mL | Freq: Two times a day (BID) | ORAL | Status: DC
  Administered 2017-03-02 – 2017-03-07 (×4): 237 mL via ORAL

## 2017-03-01 MED ORDER — SODIUM CHLORIDE 0.45 % IV SOLN
INTRAVENOUS | Status: DC
  Administered 2017-03-01 – 2017-03-02 (×2): via INTRAVENOUS

## 2017-03-01 MED ORDER — RIVAROXABAN 15 MG PO TABS
15.0000 mg | ORAL_TABLET | Freq: Every day | ORAL | Status: DC
  Administered 2017-03-01 – 2017-03-18 (×17): 15 mg via ORAL
  Filled 2017-03-01 (×20): qty 1

## 2017-03-01 NOTE — Progress Notes (Signed)
Subjective/Complaints: Kaitlin [name] Area herself to PT very slowly Oriented to person   ROS: Limited due to cognition, but appears to deny CP, SOB, N/V/D.  Objective: Vital Signs: Blood pressure (!) 150/64, pulse (!) 56, temperature 98.5 F (36.9 C), temperature source Oral, resp. rate 17, weight 53.1 kg (117 lb), SpO2 97 %. No results found. Results for orders placed or performed during the hospital encounter of 02/22/17 (from the past 72 hour(s))  Basic metabolic panel     Status: Abnormal   Collection Time: 03/01/17  5:54 AM  Result Value Ref Range   Sodium 138 135 - 145 mmol/L   Potassium 4.3 3.5 - 5.1 mmol/L   Chloride 101 101 - 111 mmol/L   CO2 26 22 - 32 mmol/L   Glucose, Bld 78 65 - 99 mg/dL   BUN 44 (H) 6 - 20 mg/dL   Creatinine, Ser 0.94 0.44 - 1.00 mg/dL   Calcium 10.1 8.9 - 10.3 mg/dL   GFR calc non Af Amer 59 (L) >60 mL/min   GFR calc Af Amer >60 >60 mL/min    Comment: (NOTE) The eGFR has been calculated using the CKD EPI equation. This calculation has not been validated in all clinical situations. eGFR's persistently <60 mL/min signify possible Chronic Kidney Disease.    Anion gap 11 5 - 15  CBC     Status: Abnormal   Collection Time: 03/01/17  7:10 AM  Result Value Ref Range   WBC 11.3 (H) 4.0 - 10.5 K/uL   RBC 2.97 (L) 3.87 - 5.11 MIL/uL   Hemoglobin 9.3 (L) 12.0 - 15.0 g/dL   HCT 27.4 (L) 36.0 - 46.0 %   MCV 92.3 78.0 - 100.0 fL   MCH 31.3 26.0 - 34.0 pg   MCHC 33.9 30.0 - 36.0 g/dL   RDW 13.0 11.5 - 15.5 %   Platelets 415 (H) 150 - 400 K/uL     HEENT: Normocephalic, atraumatic. Cardio: RRR and no JVD. +Murmur Resp: CTA B/L and Unlabored. +[place]. GI: BS positive ND Skin:   Intact. Warm and dry Neuro: Lethargic, Confused,  Motor: 3-/5 RUE (stable) 2-/5 RLE  Dysarthria Musc/Skel:  No tenderness, no edema Gen NAD. Vital signs reivewed   Assessment/Plan: 1. Functional deficits secondary to Left MCA infarct which require 3+ hours per day of  interdisciplinary therapy in a comprehensive inpatient rehab setting. Physiatrist is providing close team supervision and 24 hour management of active medical problems listed below. Physiatrist and rehab team continue to assess barriers to discharge/monitor patient progress toward functional and medical goals. FIM: Function - Bathing Position: Shower Body parts bathed by patient: Chest, Abdomen, Left upper leg, Front perineal area, Right arm Body parts bathed by helper: Left arm, Right upper leg, Buttocks, Right lower leg, Back, Left lower leg  Function- Upper Body Dressing/Undressing What is the patient wearing?: Bra, Pull over shirt/dress Bra - Perfomed by patient: Thread/unthread left bra strap Bra - Perfomed by helper: Thread/unthread right bra strap, Hook/unhook bra (pull down sports bra) Pull over shirt/dress - Perfomed by patient: Put head through opening, Thread/unthread left sleeve Pull over shirt/dress - Perfomed by helper: Pull shirt over trunk, Thread/unthread right sleeve Assist Level:  (mod a) Function - Lower Body Dressing/Undressing What is the patient wearing?: Pants, Socks, Shoes Position: Sitting EOB Pants- Performed by helper: Thread/unthread right pants leg, Thread/unthread left pants leg, Pull pants up/down Non-skid slipper socks- Performed by patient: Don/doff right sock, Don/doff left sock Non-skid slipper socks- Performed by helper: Don/doff right sock,  Don/doff left sock Socks - Performed by helper: Don/doff right sock, Don/doff left sock Shoes - Performed by helper: Don/doff right shoe, Don/doff left shoe Assist for footwear: Maximal assist Assist for lower body dressing:  (total)  Function - Toileting Toileting activity did not occur: No continent bowel/bladder event Toileting steps completed by helper: Adjust clothing prior to toileting, Performs perineal hygiene, Adjust clothing after toileting Toileting Assistive Devices: Grab bar or rail Assist level:  Two helpers  Function - Air cabin crew transfer assistive device: Bedside commode Assist level to toilet: Moderate assist (Pt 50 - 74%/lift or lower) Assist level from toilet: Moderate assist (Pt 50 - 74%/lift or lower) Assist level to bedside commode (at bedside): 2 helpers (1 helper to assist with hygiene or clothing manipulation) Assist level from bedside commode (at bedside): 2 helpers  Function - Chair/bed transfer Chair/bed transfer method: Squat pivot Chair/bed transfer assist level: Moderate assist (Pt 50 - 74%/lift or lower)  Function - Locomotion: Wheelchair Will patient use wheelchair at discharge?: Yes Type: Manual Max wheelchair distance: 180f  Assist Level: Touching or steadying assistance (Pt > 75%) Assist Level: Touching or steadying assistance (Pt > 75%) Wheel 150 feet activity did not occur: Safety/medical concerns Function - Locomotion: Ambulation Assistive device: Rail in hallway Max distance: 12 Assist level: Moderate assist (Pt 50 - 74%) Assist level: Moderate assist (Pt 50 - 74%) Walk 50 feet with 2 turns activity did not occur: Safety/medical concerns Walk 150 feet activity did not occur: Safety/medical concerns Walk 10 feet on uneven surfaces activity did not occur: Safety/medical concerns  Function - Comprehension Comprehension: Auditory Comprehension assist level: Understands basic 50 - 74% of the time/ requires cueing 25 - 49% of the time  Function - Expression Expression: Verbal Expression assist level: Expresses basic 25 - 49% of the time/requires cueing 50 - 75% of the time. Uses single words/gestures.  Function - Social Interaction Social Interaction assist level: Interacts appropriately 50 - 74% of the time - May be physically or verbally inappropriate.  Function - Problem Solving Problem solving assist level: Solves basic 25 - 49% of the time - needs direction more than half the time to initiate, plan or complete simple  activities  Function - Memory Memory assist level: Recognizes or recalls 25 - 49% of the time/requires cueing 50 - 75% of the time Patient normally able to recall (first 3 days only): None of the above  Medical Problem List and Plan: 1.  Right hemiparesis and language deficits secondary to left MCA infarct  Cont CIR  2.  DVT Prophylaxis/Anticoagulation: Pharmaceutical: Xarelto and Plavix, high risk of bleeding, monitor 3. Pain Management: tylenol prn 4. Mood: LCSW to follow for evaluation and support. Sleep/wake cycle  5. Neuropsych: This patient is not capable of making decisions on her own behalf. 6. Skin/Wound Care: Routine pressure relief measures.  7. Fluids/Electrolytes/Nutrition: Monitor I/O. Normal BUN and creatinine on 02/23/2017, continue to monitor intake, variable meal intake 5-80% over the last several days 8. A fib: Monitor HR bid. Continue amiodarone and metoprolol for HR control.   9. Aspiration PBSJ:[id number] afebrile  Levaquin d/ced 7/29, chest x-ray reviewed, showing improving right pneumonia. Continue to encourage IS. Aspiration precautions.  10 Leukocytosis: Reactive to PNA and resolving with treatment. Increased to 13.1K on 7/25 afebrile   Ucx No growth  Cont to monitor 11. Hypokalemia: Improving with supplement. Will continue to supplement for now.   K+ improved  Cont to monitor 12. ABLA: . Monitor for signs of bleeding.  Hgb  reduced at 11.7 -> 9.3 13. Multiple Sclerosis: No medications? 14. H/o Depression: On Effexor and prozac.  5. H/o CAD s/p PTCA: On Lipitor  16. Lethargy:may be in part due to pre rtenal azotemia will start IVF             -trazodone prn  for insomnia, is sleeping better 17. Labile blood pressure no further med adjustments for now   Vitals:   02/28/17 1533 03/01/17 0419  BP: (!) 125/96 (!) 150/64  Pulse: (!) 51 (!) 56  Resp: 18 17  Temp: (!) 97.5 F (36.4 C) 98.5 F (36.9 C)   18. Hypoalbuminemia  Supplement initiated 7/28  LOS  (Days) 7 A FACE TO FACE EVALUATION WAS PERFORMED  Kaitlin [name] E 03/01/2017, 8:32 AM

## 2017-03-01 NOTE — Progress Notes (Signed)
Physical Therapy Session Note  Patient Details  Name: Kaitlin [name] MRN: [id number] Date of Birth: 1946-06-16  Today's Date: 03/01/2017 PT Individual Time: 0800-0915 PT Individual Time Calculation (min): 75 min   Short Term Goals: Week 1:  PT Short Term Goal 1 (Week 1): Pt will perform bed mobility with min assist 75% of trials PT Short Term Goal 2 (Week 1): Pt will perform squat pivot transfer with mod assist  PT Short Term Goal 3 (Week 1): Pt will ambulate 30f with mod assist and LRAD  PT Short Term Goal 4 (Week 1): Pt will propell WC 1014fwith min assist   Skilled Therapeutic Interventions/Progress Updates: Pt presented hand off from nsg at toilet. Pt performed sit to stand from toilet with use of wall rail with modA and max cues for positioning and posture. Pt with poor standing balance while PTA performed prei-hygiene. Performed squat pivot L to w/c modA. Pt propelled to rehab gym minA with multimodal cues for increased use of LLE. Gait training with wall rail, 2569f 2 modA fade to minA. Pt demonstrating improving RLE placement following verbal cues for increasing BoS. Performed sit to/from stand from w/c with cues for hand over hand assist for proper placement for LUE, pt able to place RUE on RW 2/5 attempts. Performed visual mirror feedback to improve midline posture.  Pt demonstrating improved eccentric control with verbal cues. Performed dynamic standing balance with RW including alternaing toe taps. Noted increased pushing to R with fatigue. Pt returned to room total assist and remained in w/c at end of session with QRB and half lap tray in place and needs met.      Therapy Documentation Precautions:  Precautions Precautions: Fall Precaution Comments: R side inattention; impulsive Restrictions Weight Bearing Restrictions: No  See Function Navigator for Current Functional Status.   Therapy/Group: Individual Therapy  Cira Deyoe  Teruko Joswick, PTA  03/01/2017, 12:06  PM

## 2017-03-01 NOTE — Progress Notes (Signed)
Occupational Therapy Session Note  Patient Details  Name: Kaitlin [name] MRN: [id number] Date of Birth: 1946/02/03  Today's Date: 03/01/2017 OT Individual Time: 1030-1100 and 1300-1330 and 1400-1500 min  OT Individual Time Calculation (min): 30 min and 30 min and 30 min and 60 min   Short Term Goals: Week 1:  OT Short Term Goal 1 (Week 1): Pt will recall hemi dressing techniques with mod multimodal cuing OT Short Term Goal 2 (Week 1): Pt will completed 1/3 toileting tasks with steadying assist OT Short Term Goal 3 (Week 1): Pt will attend to R side of body during bathing task with min cuing.  OT Short Term Goal 4 (Week 1): Pt will don pants with mod A  Skilled Therapeutic Interventions/Progress Updates:    Session One: Pt seen for OT session focusing on sustained attention and R side awareness.  Pt sitting up in w/c upon arrival, asleep and requiring increased time and multimodal stimulus to awaken and maintain alertness. In [employer] gym, pt utilized [employer] with emphasis on R side attention and attention. Pt initially required max- total A cuing for sustained attention to task to carry over directions, however, progressed to supervision level cuing. She required increased time and max cues for head turning and locating items R of midline. She was able to identify all items L of midline independently. Pt independently initiated use of R UE to hit target lights, and required min A to reach with R UE to hit lights.  Pt returned to room at end of session, completed mod A stand pivot transfer back to EOB. Pt with total LOB episode to R when sitting EOB while therapist removed shoes. Mod A to return to supine. Pt left in supine with all needs in reach and husband present.  Session Two: Pt seen for OT session focusing on ADL re-training. Pt sitting upright in bed with RN present completing supervision for feeding task, hand off to OT for cont supervision. Pt declined transfer to EOB to seat and  therefore remained in bed to finish meal. Worked with pt on identifying and naming items on meal tray, requiring total A for naming items. She was able to locate items R of midline on tray when cued, however, did not attempt to eat food on R side of meal tray. She required significantly increased time to eat meal and appears easily distracted by internal and external factors. TV turned off by therapist to decrease stimuli/distraction.   Upon finishing meal, pt refusing all OOB or EOB tx. She stated she wanted to rest and attempt therapy later. Re-oriented pt to situation and need for participation in order to go home. Pt agreeable to rest and have therapist return later.  Session Three: therapist returned following 30 minute break. Pt awake in supine. She was still refusing OOB tx and is having difficulty understanding purpose of tx sessions as she is continuously disoriented to place and situation. With increased time, encouragement, and education, pt willing to come into sitting EOB.  Sitting EOB, addressed cognition and orientation from table top level. Pt correctly sequenced cards of days of the week with 100% accuracy and correctly sequenced 11 out of 12 months with increased time. She required max A to sort months by season and total A for orientation to place, time and situation. Pt unable to immediately recall orientation facts. Pt agreeable to attempt toileting task. Completed stand pivot transfers with mod A. Total A for toileting due to decreased standing balance. She returned to  bed at end of session, left in supine with all needs in reach and bed alarm on. Spoke with RN to have pt get up to chair for dinner tonight to promote OOB tolerance.   Therapy Documentation Precautions:  Precautions Precautions: Fall Precaution Comments: R side inattention; impulsive Restrictions Weight Bearing Restrictions: No Pain:   No/ denies pain  See Function Navigator for Current Functional  Status.   Therapy/Group: Individual Therapy  Lewis, Ledell Codrington C 03/01/2017, 7:05 AM

## 2017-03-01 NOTE — Progress Notes (Signed)
Speech Language Pathology Daily Session Note  Patient Details  Name: Kaitlin [name] MRN: [id number] Date of Birth: 1945/12/12  Today's Date: 03/01/2017 SLP Individual Time: 1130-1200 SLP Individual Time Calculation (min): 30 min  Short Term Goals: Week 1: SLP Short Term Goal 1 (Week 1): Pt will utilize word finding strategies to name common objects with Mod A cues.  SLP Short Term Goal 2 (Week 1): Pt will utilize external memory aids to recall dialy information (to include orientation information) with Mod A cues.  SLP Short Term Goal 3 (Week 1): Pt will sustain attention to basic functional task for ~ 15 minutes with Mod A. SLP Short Term Goal 4 (Week 1): Pt will complete basic familiar task during ADLs with Mod A cues.  SLP Short Term Goal 5 (Week 1): Pt will consume trials of dysphagia 3 without overt s/s of oral phase dysphagia with Min A cues for use of compensatory swallow strategies.  SLP Short Term Goal 6 (Week 1): Pt will utilize speech intelligibility strategies to achieve ~ 75% intelligibility at the word level with Mod A cues.   Skilled Therapeutic Interventions: Skilled treatment session focused on functional communication goals. Upon arrival, patient was asleep and required more than a reasonable amount of time for arousal and Max A verbal and tactile cues. Patient named functional items with 50% accuracy and required Max-Total A to identify a similarity between the two objects at the phrase level. Patient's functional communication appeared to be impacted by fatigue and verbal perseveration this session. Patient left upright in bed with husband present. Continue with current plan of care.      Function:  Cognition Comprehension Comprehension assist level: Understands basic 50 - 74% of the time/ requires cueing 25 - 49% of the time  Expression   Expression assist level: Expresses basic 25 - 49% of the time/requires cueing 50 - 75% of the time. Uses single words/gestures.  Social  Interaction Social Interaction assist level: Interacts appropriately 50 - 74% of the time - May be physically or verbally inappropriate.  Problem Solving Problem solving assist level: Solves basic 25 - 49% of the time - needs direction more than half the time to initiate, plan or complete simple activities  Memory Memory assist level: Recognizes or recalls 25 - 49% of the time/requires cueing 50 - 75% of the time    Pain No/Denies Pain   Therapy/Group: Individual Therapy  Kaitlin [name] 03/01/2017, 12:19 PM

## 2017-03-02 ENCOUNTER — Inpatient Hospital Stay (HOSPITAL_COMMUNITY): Payer: Self-pay | Admitting: Occupational Therapy

## 2017-03-02 ENCOUNTER — Inpatient Hospital Stay (HOSPITAL_COMMUNITY): Payer: Self-pay

## 2017-03-02 ENCOUNTER — Inpatient Hospital Stay (HOSPITAL_COMMUNITY): Payer: Self-pay | Admitting: Physical Therapy

## 2017-03-02 NOTE — Progress Notes (Signed)
Physical Therapy Session Note  Patient Details  Name: Kaitlin [name] MRN: [id number] Date of Birth: May 04, 1946  Today's Date: 03/02/2017 PT Individual Time: 1400-1500 PT Individual Time Calculation (min): 60 min   Short Term Goals: Week 1:  PT Short Term Goal 1 (Week 1): Pt will perform bed mobility with min assist 75% of trials PT Short Term Goal 2 (Week 1): Pt will perform squat pivot transfer with mod assist  PT Short Term Goal 3 (Week 1): Pt will ambulate 24ft with mod assist and LRAD  PT Short Term Goal 4 (Week 1): Pt will propell WC 110ft with min assist   Skilled Therapeutic Interventions/Progress Updates:   Pt received sitting in WC and agreeable to PT  WC mobility with L hemi technique x 133ft with min assist from PT.   Gait training at rail in hall 2 x 50ft with min assist. Pt noted to use LUE to pull on rail to prevent R lateral LOB.  Gait with HW and with HHA. X 71ft each with mod assist. Increased Lateral lean with fatigue. PT also instructed pt in Gait with shopping cart x 175ft with min progressing to mod assist. Improved posture and gait pattern noted with shopping cart as well as improved attention to RUE to maintian grip.    PT instructed pt in stair training( 8x 3" step) min assist for ascent and mod assist for descent. Max cues for gait pattern to step with LLE to prevent pushing to the R. PT returned pt to room in [facility]  Toilet transfer with RW and mod assist from PT. Left with RN on toilet.        Therapy Documentation Precautions:  Precautions Precautions: Fall Precaution Comments: R side inattention; impulsive Restrictions Weight Bearing Restrictions: No Pain: none  See Function Navigator for Current Functional Status.   Therapy/Group: Individual Therapy  Golden Pop 03/02/2017, 3:07 PM

## 2017-03-02 NOTE — Progress Notes (Signed)
Initial Nutrition Assessment  DOCUMENTATION CODES:   Not applicable  INTERVENTION:  Continue 30 ml Prostat po BID, each supplement provides 100 kcal and 15 grams of protein.   Continue Ensure Enlive po BID, each supplement provides 350 kcal and 20 grams of protein.  Encourage adequate PO intake.   NUTRITION DIAGNOSIS:   Increased nutrient needs related to  (therapy) as evidenced by estimated needs.  GOAL:   Patient will meet greater than or equal to 90% of their needs  MONITOR:   PO intake, Supplement acceptance, Labs, Weight trends, Skin, I & O's, Diet advancement  REASON FOR ASSESSMENT:   Malnutrition Screening Tool    ASSESSMENT:   71 y.o. female with history of CAD s/p PTCA, MS with gait disorder/memory loss, PAF who was admitted on 02/14/17 with reports of speech difficulty progressing to right sided weakness. CTA  head showed evidence of infarct left insular and left posterior putamen with  occlusion of L-CCA and L-ICA from arch to distal cavernous segment and incidental finding of centrilobar emphysema in lung apices. She underwent cerebral angio with complete revascularization of occluded L-MCA with one pass Solitaire and stent assisted angioplasty of proximal L-ICA with IA integrelin. She was started on dysphagia 2, thin liquids due to mild oropharyngeal dysphagia with stasis.  Pt reports appetite is fine currently and PTA with usual consumption of at least 3 meals a day.  Meal completion has been 20-50% more recently. Usual body weight reported to be ~125 lbs. Question accuracy of most recent weight. Pt currently has Prostat and ensure ordered and has been consuming them. RD to continue with current orders.   Nutrition-Focused physical exam completed. Findings are no fat depletion, mild to moderate muscle depletion, and mild edema.   Labs and medications reviewed.   Diet Order:  DIET DYS 2 Room service appropriate? Yes; Fluid consistency: Thin  Skin:  Reviewed, no  issues  Last BM:  8/1  Height:   Ht Readings from Last 1 Encounters:  02/16/17 5\' 2"  (1.575 m)    Weight:   Wt Readings from Last 1 Encounters:  02/23/17 117 lb (53.1 kg)    Ideal Body Weight:  50 kg  BMI:  Body mass index is 21.4 kg/m.  Estimated Nutritional Needs:   Kcal:  1550-1750  Protein:  60-70 grams  Fluid:  >/= 1.5 L/day  EDUCATION NEEDS:   No education needs identified at this time  Roslyn Smiling, MS, RD, LDN Pager # [phone] After hours/ weekend pager # [phone]

## 2017-03-02 NOTE — Progress Notes (Signed)
Occupational Therapy Weekly Progress Note  Patient Details  Name: Kaitlin [name] MRN: [id number] Date of Birth: 1946-02-29  Beginning of progress report period: February 23, 2017 End of progress report period: March 02, 2017  Today's Date: 03/02/2017 OT Individual Time: 6644-0347 OT Individual Time Calculation (min): 60 min    Patient has met 1 of 4 short term goals.  Pt making slow but steady progress towards OT goals. She cont to be most limited by R inattention, poor STM affecting her ability to carry over education, poor activity tolerance and poor standing balance due to R lean. Pt attempting to use R UE in functional tasks, however, has limited ROM and with apraxia. She requires max- total A VC and assist for attending to R side during bathing/dressing task. Pt's husband has been present for most tx sessions and is aware of pt's current level of function. Have not begun hands on training at this time for safety reasons.    Patient continues to demonstrate the following deficits: muscle weakness, decreased cardiorespiratoy endurance, unbalanced muscle activation, motor apraxia, ataxia, decreased coordination and decreased motor planning, decreased midline orientation, decreased attention to right and decreased motor planning, decreased attention, decreased awareness, decreased problem solving, decreased safety awareness, decreased memory and delayed processing and decreased sitting balance, decreased standing balance, decreased postural control, hemiplegia and decreased balance strategies and therefore will continue to benefit from skilled OT intervention to enhance overall performance with BADL and Reduce care partner burden.  Patient progressing toward long term goals..  Continue plan of care.  OT Short Term Goals Week 1:  OT Short Term Goal 1 (Week 1): Pt will recall hemi dressing techniques with mod multimodal cuing OT Short Term Goal 1 - Progress (Week 1): Not met OT Short Term Goal 2 (Week  1): Pt will completed 1/3 toileting tasks with steadying assist OT Short Term Goal 2 - Progress (Week 1): Not met OT Short Term Goal 3 (Week 1): Pt will attend to R side of body during bathing task with min cuing.  OT Short Term Goal 3 - Progress (Week 1): Met OT Short Term Goal 4 (Week 1): Pt will don pants with mod A OT Short Term Goal 4 - Progress (Week 1): Not met Week 2:  OT Short Term Goal 1 (Week 2): Pt will complete 1/3 toileting tasks with steadying assist OT Short Term Goal 2 (Week 2): Pt will thread R LE into pants with set-up assist and VCs OT Short Term Goal 3 (Week 2): Pt will use R UE at stabilizer level with min A during grooming tasks OT Short Term Goal 4 (Week 2): Pt will tolerate full dressing task with no more than 1 rest break in order to increase functional activity tolerance  Skilled Therapeutic Interventions/Progress Updates:    Pt seen for OT ADL bathing/dressing session. Pt sitting up in w/c upon arrival, finishing breakfast with husband present. Pt agreeable to tx session and bathing at shower level. She did not recall this therapist from yesterday. Completed mod A stand pivot transfers throughout session with multimodal cuing each time for hand placement and sequencing of steps. Upon sitting on BSC in shower pt voicing bowel and bladder, with awareness.  She bathed seated on BSC, automatically using R UE to assist with washing L UE, able to reach up to shoulder with R UE to bathe though did demonstrate some decreased control. Assist provided for LEs due to poor sitting balance needed fyr dynamic sitting balance ot wash LEs.  She returned to w/c to dress. Pt set-up with bra and shirt, and without cuing pt able to don bra, attending to L and R UE, assist provided for fastening. She donned 3/4 of shirt, able to correctly identify that she did not thread R UE, however, was unable to fix it independently. She threaded B LEs into pants, and stood with initially mod A steadying  balance, progressing to min A for standing balance while pants pulled up.  She was unable to locate grooming items placed on far R side of sink ledge, however, once comb moved to just R of midline pt able to locate comb. Pt left sitting up in w/c at end of session, QRB donned, and husband present.   Therapy Documentation Precautions:  Precautions Precautions: Fall Precaution Comments: R side inattention; impulsive Restrictions Weight Bearing Restrictions: No Pain:   No/ denies pain  See Function Navigator for Current Functional Status.   Therapy/Group: Individual Therapy  Lewis, Zadrian Mccauley C 03/02/2017, 7:08 AM

## 2017-03-02 NOTE — Progress Notes (Signed)
Occupational Therapy Session Note  Patient Details  Name: Merary Laurila MRN: [id number] Date of Birth: 1946/01/12  Today's Date: 03/02/2017 OT Individual Time: 1300-1345 OT Individual Time Calculation (min): 45 min    Short Term Goals: Week 2:  OT Short Term Goal 1 (Week 2): Pt will complete 1/3 toileting tasks with steadying assist OT Short Term Goal 2 (Week 2): Pt will thread R LE into pants with set-up assist and VCs OT Short Term Goal 3 (Week 2): Pt will use R UE at stabilizer level with min A during grooming tasks OT Short Term Goal 4 (Week 2): Pt will tolerate full dressing task with no more than 1 rest break in order to increase functional activity tolerance  Skilled Therapeutic Interventions/Progress Updates:    Pt received sitting up in bed, finishing lunch, husband present. Pt agreeable to OT tx session. Supervision provided for completing meal, incorporating having Pt visually scan to R and identify items on plate. Pt with increased difficulty naming items, naming 1/5 food items correctly. Pt was not able to identify "icecream" when asked, but was able to correctly identify that it was vanilla flavored (vs chocolate). After meal completion Pt moves to sitting EOB with MinA for scooting hips to EOB, Pt maintains sitting balance with minguard-minA while therapist dons footwear (total assist for footwear). ModA for stand pivot transfer EOB>w/c. Transported Pt to BI gym where Pt engages in dynavision for 5 min and 1 min trial of visual scanning activity. Utilizing rings 1 and 2 during activity, Pt is able to successfully locate lights with slightly increased time and uses RUE with MinA to push each light. Returned to room end of session where Pt was left seated in w/c, call bell and needs within reach, husband present.   Therapy Documentation Precautions:  Precautions Precautions: Fall Precaution Comments: R side inattention; impulsive Restrictions Weight Bearing Restrictions:  No   Pain: Pain Assessment Pain Assessment: No/denies pain  See Function Navigator for Current Functional Status.   Therapy/Group: Individual Therapy  Orlando Penner 03/02/2017, 5:07 PM

## 2017-03-02 NOTE — Progress Notes (Signed)
Speech Language Pathology Daily Session Note  Patient Details  Name: Kaitlin [name] MRN: [id number] Date of Birth: 1945-10-10  Today's Date: 03/02/2017 SLP Individual Time: 1500-1529 SLP Individual Time Calculation (min): 29 min  Short Term Goals: Week 1: SLP Short Term Goal 1 (Week 1): Pt will utilize word finding strategies to name common objects with Mod A cues.  SLP Short Term Goal 2 (Week 1): Pt will utilize external memory aids to recall dialy information (to include orientation information) with Mod A cues.  SLP Short Term Goal 3 (Week 1): Pt will sustain attention to basic functional task for ~ 15 minutes with Mod A. SLP Short Term Goal 4 (Week 1): Pt will complete basic familiar task during ADLs with Mod A cues.  SLP Short Term Goal 5 (Week 1): Pt will consume trials of dysphagia 3 without overt s/s of oral phase dysphagia with Min A cues for use of compensatory swallow strategies.  SLP Short Term Goal 6 (Week 1): Pt will utilize speech intelligibility strategies to achieve ~ 75% intelligibility at the word level with Mod A cues.   Skilled Therapeutic Interventions: Skilled ST services focused on cognitive goals. SLP facilitated expressive word finding tasks given a picture scene with Max A verbal cues required to describe scene. SLP facilitated expressive word finding tasks utilizing the LARK, given individual items and Maximum sentence completion, tactile, and binary choice cues pt demonstrated Max- Mod impairment. Pt was left in room with visitor and husband with call bell within reach. Recommend to continue ST services.      Function:  Eating Eating   Modified Consistency Diet: Yes Eating Assist Level: Supervision or verbal cues           Cognition Comprehension Comprehension assist level: Understands basic 50 - 74% of the time/ requires cueing 25 - 49% of the time  Expression   Expression assist level: Expresses basic 25 - 49% of the time/requires cueing 50 - 75% of the  time. Uses single words/gestures.  Social Interaction Social Interaction assist level: Interacts appropriately 50 - 74% of the time - May be physically or verbally inappropriate.  Problem Solving Problem solving assist level: Solves basic 25 - 49% of the time - needs direction more than half the time to initiate, plan or complete simple activities  Memory Memory assist level: Recognizes or recalls 25 - 49% of the time/requires cueing 50 - 75% of the time    Pain Pain Assessment Pain Assessment: No/denies pain  Therapy/Group: Individual Therapy  Mykaela Arena  [facility] 03/02/2017, 5:09 PM

## 2017-03-02 NOTE — Progress Notes (Signed)
Subjective/Complaints: No issues overnite Oriented to person, "MD office"  ROS: Limited due to cognition, but appears to deny CP, SOB, N/V/D.  Objective: Vital Signs: Blood pressure (!) 155/53, pulse 65, temperature 98.5 F (36.9 C), temperature source Oral, resp. rate 17, weight 53.1 kg (117 lb), SpO2 98 %. No results found. Results for orders placed or performed during the hospital encounter of 02/22/17 (from the past 72 hour(s))  Basic metabolic panel     Status: Abnormal   Collection Time: 03/01/17  5:54 AM  Result Value Ref Range   Sodium 138 135 - 145 mmol/L   Potassium 4.3 3.5 - 5.1 mmol/L   Chloride 101 101 - 111 mmol/L   CO2 26 22 - 32 mmol/L   Glucose, Bld 78 65 - 99 mg/dL   BUN 44 (H) 6 - 20 mg/dL   Creatinine, Ser 0.94 0.44 - 1.00 mg/dL   Calcium 10.1 8.9 - 10.3 mg/dL   GFR calc non Af Amer 59 (L) >60 mL/min   GFR calc Af Amer >60 >60 mL/min    Comment: (NOTE) The eGFR has been calculated using the CKD EPI equation. This calculation has not been validated in all clinical situations. eGFR's persistently <60 mL/min signify possible Chronic Kidney Disease.    Anion gap 11 5 - 15  CBC     Status: Abnormal   Collection Time: 03/01/17  7:10 AM  Result Value Ref Range   WBC 11.3 (H) 4.0 - 10.5 K/uL   RBC 2.97 (L) 3.87 - 5.11 MIL/uL   Hemoglobin 9.3 (L) 12.0 - 15.0 g/dL   HCT 27.4 (L) 36.0 - 46.0 %   MCV 92.3 78.0 - 100.0 fL   MCH 31.3 26.0 - 34.0 pg   MCHC 33.9 30.0 - 36.0 g/dL   RDW 13.0 11.5 - 15.5 %   Platelets 415 (H) 150 - 400 K/uL     HEENT: Normocephalic, atraumatic. Cardio: RRR and no JVD. +Murmur Resp: CTA B/L and Unlabored. +. GI: BS positive ND Skin:   Intact. Warm and dry Neuro: Lethargic, Confused,  Motor: 3-/5 RUE (stable) 2-/5 RLE  Dysarthria Musc/Skel:  No tenderness, no edema Gen NAD. Vital signs reivewed   Assessment/Plan: 1. Functional deficits secondary to Left MCA infarct which require 3+ hours per day of interdisciplinary  therapy in a comprehensive inpatient rehab setting. Physiatrist is providing close team supervision and 24 hour management of active medical problems listed below. Physiatrist and rehab team continue to assess barriers to discharge/monitor patient progress toward functional and medical goals. FIM: Function - Bathing Position: Shower Body parts bathed by patient: Chest, Abdomen, Left upper leg, Front perineal area, Right arm, Left arm, Right upper leg Body parts bathed by helper: Buttocks, Right lower leg, Left lower leg, Back  Function- Upper Body Dressing/Undressing What is the patient wearing?: Bra, Pull over shirt/dress Bra - Perfomed by patient: Thread/unthread right bra strap, Thread/unthread left bra strap Bra - Perfomed by helper: Hook/unhook bra (pull down sports bra) Pull over shirt/dress - Perfomed by patient: Put head through opening, Thread/unthread left sleeve, Pull shirt over trunk Pull over shirt/dress - Perfomed by helper: Thread/unthread right sleeve Assist Level:  (mod a) Function - Lower Body Dressing/Undressing What is the patient wearing?: Pants, Socks, Shoes Position: Wheelchair/chair at [employer]- Performed by patient: Thread/unthread right pants leg, Thread/unthread left pants leg Pants- Performed by helper: Thread/unthread right pants leg, Thread/unthread left pants leg, Pull pants up/down Non-skid slipper socks- Performed by patient: Don/doff right sock, Don/doff left  sock Non-skid slipper socks- Performed by helper: Don/doff right sock, Don/doff left sock Socks - Performed by helper: Don/doff right sock, Don/doff left sock Shoes - Performed by helper: Don/doff right shoe, Don/doff left shoe Assist for footwear: Maximal assist Assist for lower body dressing:  (total)  Function - Toileting Toileting activity did not occur: No continent bowel/bladder event Toileting steps completed by helper: Adjust clothing prior to toileting, Performs perineal hygiene, Adjust  clothing after toileting Toileting Assistive Devices: Grab bar or rail Assist level: Touching or steadying assistance (Pt.75%)  Function - Toilet Transfers Toilet transfer assistive device: Grab bar Assist level to toilet: Moderate assist (Pt 50 - 74%/lift or lower) Assist level from toilet: Moderate assist (Pt 50 - 74%/lift or lower) Assist level to bedside commode (at bedside): 2 helpers (1 helper to assist with hygiene or clothing manipulation) Assist level from bedside commode (at bedside): 2 helpers  Function - Chair/bed transfer Chair/bed transfer method: Squat pivot Chair/bed transfer assist level: Moderate assist (Pt 50 - 74%/lift or lower) Chair/bed transfer assistive device: Armrests  Function - Locomotion: Wheelchair Will patient use wheelchair at discharge?: Yes Type: Manual Max wheelchair distance: 150f  Assist Level: Touching or steadying assistance (Pt > 75%) Assist Level: Touching or steadying assistance (Pt > 75%) Wheel 150 feet activity did not occur: Safety/medical concerns Function - Locomotion: Ambulation Assistive device: Rail in hallway Max distance: 25 Assist level: Moderate assist (Pt 50 - 74%) Assist level: Moderate assist (Pt 50 - 74%) Walk 50 feet with 2 turns activity did not occur: Safety/medical concerns Walk 150 feet activity did not occur: Safety/medical concerns Walk 10 feet on uneven surfaces activity did not occur: Safety/medical concerns  Function - Comprehension Comprehension: Auditory Comprehension assist level: Understands basic 50 - 74% of the time/ requires cueing 25 - 49% of the time  Function - Expression Expression: Verbal Expression assist level: Expresses basic 25 - 49% of the time/requires cueing 50 - 75% of the time. Uses single words/gestures.  Function - Social Interaction Social Interaction assist level: Interacts appropriately 50 - 74% of the time - May be physically or verbally inappropriate.  Function - Problem  Solving Problem solving assist level: Solves basic 25 - 49% of the time - needs direction more than half the time to initiate, plan or complete simple activities  Function - Memory Memory assist level: Recognizes or recalls 25 - 49% of the time/requires cueing 50 - 75% of the time Patient normally able to recall (first 3 days only): None of the above  Medical Problem List and Plan: 1.  Right hemiparesis and language deficits secondary to left MCA infarct  Cont CIR, PT, OT, SLP Team conference today please see physician documentation under team conference tab, met with team face-to-face to discuss problems,progress, and goals. Formulized individual treatment plan based on medical history, underlying problem and comorbidities. 2.  DVT Prophylaxis/Anticoagulation: Pharmaceutical: Xarelto and Plavix, high risk of bleeding, monitor 3. Pain Management: tylenol prn 4. Mood: LCSW to follow for evaluation and support. Sleep/wake cycle  5. Neuropsych: This patient is not capable of making decisions on her own behalf. 6. Skin/Wound Care: Routine pressure relief measures.  7. Fluids/Electrolytes/Nutrition: Monitor I/O. Normal BUN and creatinine on 02/23/2017, continue to monitor intake, variable meal intake 5-80% over the last several days 8. A fib: Monitor HR bid. Continue amiodarone and metoprolol for HR control.   9. Aspiration [id number] afebrile  Levaquin d/ced 7/29, chest x-ray reviewed, showing improving right pneumonia. Continue to encourage IS. Aspiration precautions.  10 Leukocytosis: Reactive to PNA and resolving with treatment. Increased to 13.1K on 7/25 afebrile   Ucx No growth  Cont to monitor 11. Hypokalemia: Improving with supplement. Will continue to supplement for now.   K+ improved  Cont to monitor 12. ABLA: . Monitor for signs of bleeding. Hgb  reduced at 11.7 -> 9.3 13. Multiple Sclerosis: No medications? 14. H/o Depression: On Effexor and prozac.  42. H/o CAD s/p PTCA: On  Lipitor  16. Lethargy:may be in part due to pre rtenal azotemia will start IVF   Recheck BMET in am          -trazodone prn  for insomnia, is sleeping better 17. Labile blood pressure no further med adjustments for now 8/1   Vitals:   03/01/17 1510 03/02/17 0434  BP: (!) 130/43 (!) 155/53  Pulse: 66 65  Resp: 17 17  Temp: 98.4 F (36.9 C) 98.5 F (36.9 C)   18. Hypoalbuminemia  Supplement initiated 7/28  LOS (Days) 8 A FACE TO FACE EVALUATION WAS PERFORMED  Asami Lambright E 03/02/2017, 9:27 AM

## 2017-03-03 ENCOUNTER — Inpatient Hospital Stay (HOSPITAL_COMMUNITY): Payer: Self-pay | Admitting: Physical Therapy

## 2017-03-03 ENCOUNTER — Inpatient Hospital Stay (HOSPITAL_COMMUNITY): Payer: Self-pay | Admitting: Occupational Therapy

## 2017-03-03 ENCOUNTER — Inpatient Hospital Stay (HOSPITAL_COMMUNITY): Payer: Medicare Other | Admitting: Speech Pathology

## 2017-03-03 LAB — BASIC METABOLIC PANEL
Anion gap: 8 (ref 5–15)
BUN: 13 mg/dL (ref 6–20)
CHLORIDE: 103 mmol/L (ref 101–111)
CO2: 22 mmol/L (ref 22–32)
Calcium: 8.6 mg/dL — ABNORMAL LOW (ref 8.9–10.3)
Creatinine, Ser: 0.64 mg/dL (ref 0.44–1.00)
GFR calc Af Amer: >60 mL/min (ref >60)
GFR calc non Af Amer: >60 mL/min (ref >60)
Glucose, Bld: 96 mg/dL (ref 65–99)
POTASSIUM: 4.3 mmol/L (ref 3.5–5.1)
Sodium: 133 mmol/L — ABNORMAL LOW (ref 135–145)

## 2017-03-03 MED ORDER — SENNOSIDES-DOCUSATE SODIUM 8.6-50 MG PO TABS
2.0000 | ORAL_TABLET | Freq: Every evening | ORAL | Status: DC | PRN
  Administered 2017-03-15: 2 via ORAL
  Filled 2017-03-03: qty 2

## 2017-03-03 NOTE — Progress Notes (Signed)
Social Work Patient ID: Kaitlin [name], female   DOB: [birth year], 71 y.o.   MRN: [id number]   CSW met with pt and her husband to update them on team conference discussion.  Pt is targeted for d/c on 03-19-17.  Husband was pleased with this date.  He still very much wants to know what modifications he should do for the house and CSW asked team in conference to begin discussing this with the pt.  He also wanted to know pt's goals with therapies and CSW provided him a copy of PT/OT/ST goals.  He was appreciative and feels this will help him to have future discussions with the therapists as pt progresses.  CSW will continue to follow and assist as needed.

## 2017-03-03 NOTE — Progress Notes (Signed)
Speech Language Pathology Weekly Progress and Session Note  Patient Details  Name: Kaitlin [name] MRN: [id number] Date of Birth: 04/08/1946  Beginning of progress report period: February 24, 2017 End of progress report period: March 03, 2017  Today's Date: 03/03/2017 SLP Individual Time: 6979-4801 SLP Individual Time Calculation (min): 45 min  Short Term Goals: Week 1: SLP Short Term Goal 1 (Week 1): Pt will utilize word finding strategies to name common objects with Mod A cues.  SLP Short Term Goal 1 - Progress (Week 1): Not met SLP Short Term Goal 2 (Week 1): Pt will utilize external memory aids to recall dialy information (to include orientation information) with Mod A cues.  SLP Short Term Goal 2 - Progress (Week 1): Not met SLP Short Term Goal 3 (Week 1): Pt will sustain attention to basic functional task for ~ 15 minutes with Mod A. SLP Short Term Goal 3 - Progress (Week 1): Not met SLP Short Term Goal 4 (Week 1): Pt will complete basic familiar task during ADLs with Mod A cues.  SLP Short Term Goal 4 - Progress (Week 1): Not met SLP Short Term Goal 5 (Week 1): Pt will consume trials of dysphagia 3 without overt s/s of oral phase dysphagia with Min A cues for use of compensatory swallow strategies.  SLP Short Term Goal 5 - Progress (Week 1): Not met SLP Short Term Goal 6 (Week 1): Pt will utilize speech intelligibility strategies to achieve ~ 75% intelligibility at the word level with Mod A cues.  SLP Short Term Goal 6 - Progress (Week 1): Not met    New Short Term Goals: Week 2: SLP Short Term Goal 1 (Week 2): Pt will utilize word finding strategies to name common objects with 85% accuracy and Mod A cues.  SLP Short Term Goal 2 (Week 2): Pt will utilize external memory aids to recall daily information (to include orientation information) with Mod A cues.  SLP Short Term Goal 3 (Week 2): Pt will sustain attention to basic functional task for ~ 15 minutes with Mod A. SLP Short Term Goal  4 (Week 2): Pt will complete basic familiar task during ADLs with Mod A cues.  SLP Short Term Goal 5 (Week 2): Pt will consume trials of dysphagia 3 without overt s/s of oral phase dysphagia with Min A cues for use of compensatory swallow strategies.  SLP Short Term Goal 6 (Week 2): Pt will utilize speech intelligibility strategies to achieve ~ 75% intelligibility at the word level with Mod A cues.   Weekly Progress Updates: Pt has made slow progress this reporting period however d/t this slow progress, she has not met any of her STGs. Pt continues with significant deficits in expressive communication, cognitive function and dysphagia. As a result, skilled ST is required to address these deficits and increase pt independence and reduce caregiver burden.      Intensity: Minumum of 1-2 x/day, 30 to 90 minutes Frequency: 3 to 5 out of 7 days Duration/Length of Stay: 8/18 Treatment/Interventions: Cognitive remediation/compensation;Cueing hierarchy;DME/adaptive equipment instruction;Dysphagia/aspiration precaution training;Environmental controls;Functional tasks;Internal/external aids;Patient/family education;Therapeutic Activities;Speech/Language facilitation   Daily Session  Skilled Therapeutic Interventions: Skilled treatment session focused on speech communication goals. SLP facilitated session by providing object naming tasks. Pt able to name common objects with 70% accuracy. With sentence completion cues, pt able to name remaining objects. When reading at word and phrase level, pt with decreased ability as her reading continued multiple paraphasias per phrase. As session progress, pt with increased  fatigue and decreased intelligibility. Trials of ice water and cold diced peaches given to increase attention. Pt consumed without overt s/s of aspiration. Recommend trial tray of dysphagia 3 before upgrading. Pt returned to room, left upright in wheelchair with husband present. Continue per current plan  of care.       Function:   Eating Eating   Modified Consistency Diet: No Eating Assist Level: Set up assist for;Supervision or verbal cues   Eating Set Up Assist For: Opening containers       Cognition Comprehension Comprehension assist level: Understands basic 50 - 74% of the time/ requires cueing 25 - 49% of the time  Expression   Expression assist level: Expresses basic 25 - 49% of the time/requires cueing 50 - 75% of the time. Uses single words/gestures.  Social Interaction Social Interaction assist level: Interacts appropriately 50 - 74% of the time - May be physically or verbally inappropriate.  Problem Solving Problem solving assist level: Solves basic 25 - 49% of the time - needs direction more than half the time to initiate, plan or complete simple activities  Memory Memory assist level: Recognizes or recalls 25 - 49% of the time/requires cueing 50 - 75% of the time   General    Pain Pain Assessment Pain Assessment: No/denies pain  Therapy/Group: Individual Therapy  Lamis Behrmann 03/03/2017, 11:45 AM

## 2017-03-03 NOTE — Progress Notes (Signed)
Physical Therapy Weekly Progress Note  Patient Details  Name: Kaitlin [name] MRN: [id number] Date of Birth: 1946/03/27  Beginning of progress report period: February 23, 2017 End of progress report period: March 03, 2017  Today's Date: 03/03/2017 PT Individual Time: 1300-1415 PT Individual Time Calculation (min): 75 min   Patient has met 4 of 4 short term goals.  Pt making slow progress toward goals. Pt has demonstrated decreased pushing tendencies and able to stand with min-supervision assist with UE support as well as improved Stability with gait requiring mod assist with RW and DF assist with Ace wrap.   Patient continues to demonstrate the following deficits muscle weakness, impaired timing and sequencing, abnormal tone, unbalanced muscle activation, motor apraxia, decreased coordination and decreased motor planning, decreased midline orientation, decreased attention to right, right side neglect, decreased motor planning and ideational apraxia, decreased attention, decreased awareness, decreased problem solving, decreased safety awareness, decreased memory and delayed processing and decreased sitting balance, decreased standing balance, decreased postural control, hemiplegia and decreased balance strategies and therefore will continue to benefit from skilled PT intervention to increase functional independence with mobility.  Patient progressing toward long term goals..  Continue plan of care.  PT Short Term Goals Week 1:  PT Short Term Goal 1 (Week 1): Pt will perform bed mobility with min assist 75% of trials PT Short Term Goal 1 - Progress (Week 1): Met PT Short Term Goal 2 (Week 1): Pt will perform squat pivot transfer with mod assist  PT Short Term Goal 2 - Progress (Week 1): Met PT Short Term Goal 3 (Week 1): Pt will ambulate 77f with mod assist and LRAD  PT Short Term Goal 3 - Progress (Week 1): Met PT Short Term Goal 4 (Week 1): Pt will propell WC 1071fwith min assist  PT Short Term  Goal 4 - Progress (Week 1): Met Week 2:  PT Short Term Goal 1 (Week 2): Pt will ambulate 12571fith min assist and LRAD  PT Short Term Goal 2 (Week 2): Pt will propell WC 100f7fth supervision assist  PT Short Term Goal 3 (Week 2): Pt will perform bed mobility with supervision assist  PT Short Term Goal 4 (Week 2): Pt will perform bed<>WC transfers with min assist consistently.    Skilled Therapeutic Interventions/Progress Updates:   Pt received sitting in WC and agreeable to PT  Gait training with Shopping cart and with RW using  x [id number]. PT used ace wrap to maintain DF and provide toe cap on RLE. Min-moderate multimodal cues for improved midline orientation, gait pattern and improve awareness of RUE.   Nustep reciprocal movement pattern training 5 min +4 minutes with reset break. Pt required use of RUE support to maintain grip on Hand  rail.   WC mobilty with hemi technique x 100ft 24f min assist and moderate cues for technique and sequencing to prevent veer to the L.   Standing balance with  [employer] x 3 bouts with supervision assist for balance and 1 UE support . Program A x 2 minutes. Pt noted to have improved reaction time to stimuli on the R side with tactile cues progressing to verbal cues to attention to the R.   Patient returned too room and left [facility] in [facility] in reach and all needs met.       Therapy Documentation Precautions:  Precautions Precautions: Fall Precaution Comments: R side inattention; impulsive Restrictions Weight Bearing Restrictions: No Pain: 0/10   See Function  Navigator for Current Functional Status.  Therapy/Group: Individual Therapy  Lorie Phenix 03/03/2017, 2:20 PM

## 2017-03-03 NOTE — Patient Care Conference (Signed)
Inpatient RehabilitationTeam Conference and Plan of Care Update Date: 03/02/2017   Time: 10:40 AM    Patient Name: Kaitlin [name]      Medical Record Number: [id number]  Date of Birth: [birth year] Sex: Female         Room/Bed: 4W14C/4W14C-01 Payor Info: Payor: [employer]vertising copy[employer] MEDICARE / Plan: [facility] MEDICARE / Product Type: *No Product type* /    Admitting Diagnosis: L CVA  Admit Date/Time:  02/22/2017  5:03 PM Admission Comments: No comment available   Primary Diagnosis:  <principal problem not specified> Principal Problem: <principal problem not specified>  Patient Active Problem List   Diagnosis Date Noted  . Right hemiparesis (HCC)   . Aspiration pneumonia of right lung (HCC)   . Leukocytosis   . Hypokalemia   . Labile blood pressure   . Abnormality of gait as late effect of stroke 02/23/2017  . Dysarthria as late effect of stroke 02/23/2017  . Dysphagia as late effect of stroke 02/23/2017  . Acute ischemic left middle cerebral artery (MCA) stroke (HCC) 02/22/2017  . Paroxysmal A-fib (HCC) 02/17/2017  . Atrial fibrillation with RVR (HCC) 02/17/2017  . HLD (hyperlipidemia) 02/17/2017  . Acute hypoxemic respiratory failure (HCC)   . Stroke (cerebrum) (HCC) - Acute MCA infarct due to left CCA/ICA and MCA occlusion, s/p mechanical thrombectomy and left ICA stenting in the setting of newly diagnosed afib 02/14/2017    Expected Discharge Date: Expected Discharge Date: 03/19/17  Team Members Present: Physician leading conference: Dr. Claudette Laws Social Worker Present: Staci Acosta, LCSW Nurse Present: Kennon Portela, RN PT Present: Grier Rocher, PT;Rosita Dechalus, PTA OT Present: Johnsie Cancel, OT SLP Present: Jackalyn Lombard, SLP PPS Coordinator present : Tora Duck, RN, CRRN     Current Status/Progress Goal Weekly Team Focus  Medical   poor endurance, R HP, cognitive, dysphagia, R sided neglect  maintain nutrition and hydration po, improve endurance  adjust rehab schedule,  increase rest breaks   Bowel/Bladder   Patient can be incontinent, LBM 03/01/17 after suppository, had to be catheterized last night, voided multiple times but still had >350 in bladder, cathed for 725  less incontinent episodes & a regular bowel pattern  timed toileting   Swallow/Nutrition/ Hydration   Dys 2, thin liquids   min assist   safety with swallowing    ADL's   Total A toileting; Mod A UB bathing/dressing; max A LB bathing/dressing  Min A overall  ADL re-training, standing balance, neuro re-ed, cognitive remediation, activity tolerance   Mobility   ModA transfers, gait min/modA with wall rail 31ft, R inattention, min/mod w/c mobilty, fair standing balance  Min-supervision overall  Transfers, gait, R side NMR, activity tolerance   Communication   mod-max assist for functional communication   min assist   naming of objects, conveying needs/wants to caregivers   Safety/Cognition/ Behavioral Observations  max assist   min assist   orientation, carryover of simple, daily    Pain   No c/o pain, has tylenol prn  pain scale <2  assess & treat as needed   Skin   multiple bruises to bil arms, legs, hands, blue nail beds & delayed cap refill, cold feet, but O2 stas WNL, abrasiom to right shin scabbed & dry, no skin break down at this time  no new areas of skin break down  assess q shift    Rehab Goals Patient on target to meet rehab goals: Yes Rehab Goals Revised: none *See Care Plan and progress notes for long and  short-term goals.     Barriers to Discharge  Current Status/Progress Possible Resolutions Date Resolved   Physician    Medical stability;Incontinence;Other (comments)  cognition varies, endurance  progressing toward goals  Cont CIR,see above      Nursing  Incontinence  one in out cath with large volume of urine after voiding            PT                    OT                  SLP  (History of MS and STM loss)              SW                Discharge  Planning/Teaching Needs:  Pt to go home with her husband to provide care.  Husband will need family education.   Team Discussion:  Pt with better motor strength than what she shows you, per Dr. Wynn Banker.  He is optimistic that with time when pt's cognition improves, pt will do much better.  Pt is incontinent of bladder, but has the sensation.  Nursing to monitor PVRs.  Pt is continent of bowel.  Pt has no carryover to remember from one session to the next and has poor activity tolerance.  P tis using right arm more, but can't attend to it during dressing tasks.  Pt tends to push/lean when she is fatigued.  Pt is mod A with PT and gait training using hemi-walker.  Pt with no awareness.  Pt is max A for basic cognition and mod A for communication.  D2 diet with thing liquids and cognition is slowing her progress for diet advancement.  Revisions to Treatment Plan:  Scheduling to add more rest breaks between therapies so that pt can maximize her therapy sessions.    Continued Need for Acute Rehabilitation Level of Care: The patient requires daily medical management by a physician with specialized training in physical medicine and rehabilitation for the following conditions: Daily direction of a multidisciplinary physical rehabilitation program to ensure safe treatment while eliciting the highest outcome that is of practical value to the patient.: Yes Daily medical management of patient stability for increased activity during participation in an intensive rehabilitation regime.: Yes Daily analysis of laboratory values and/or radiology reports with any subsequent need for medication adjustment of medical intervention for : Neurological problems;Nutritional problems  Halei Hanover, [place] Deck 03/03/2017, 11:41 AM

## 2017-03-03 NOTE — Progress Notes (Signed)
Occupational Therapy Session Note  Patient Details  Name: Kaitlin [name] MRN: [id number] Date of Birth: 09/28/1945  Today's Date: 03/03/2017 OT Individual Time: 4098-1191 OT Individual Time Calculation (min): 75 min    Short Term Goals: Week 2:  OT Short Term Goal 1 (Week 2): Pt will complete 1/3 toileting tasks with steadying assist OT Short Term Goal 2 (Week 2): Pt will thread R LE into pants with set-up assist and VCs OT Short Term Goal 3 (Week 2): Pt will use R UE at stabilizer level with min A during grooming tasks OT Short Term Goal 4 (Week 2): Pt will tolerate full dressing task with no more than 1 rest break in order to increase functional activity tolerance  Skilled Therapeutic Interventions/Progress Updates:    Pt seen for OT session focusing on ADL re-training. Pt in supine upon arrival set-up with meal tray, pleasant and agreeable to tx session. She transferred to EOB with min A. Cont to eat breakfast seated EOB with guarding assist for balance. Pt unable to correctly identify items by name on food tray, however, was able to locate items on R side of meal tray when cued. She dressed seated EOB, able to correctly manage clothing items, however, requiring assist for threading of R UE/LE. She stood with mod steadying assist while pants pulled up. Completed stand pivot transfers throughout session with mod A for controlled descent and positioning of hips to sitting surface as pt with tendency to sit prematurely.  In therapy gym, pt completed reaching task seated EOM, focusing on weight shifting/ weightbearing through R and attention to R. Required VCs for weight shifting to corect balance as pt with posterior lean and LOB without awareness. Then completed in standing with steadying assist and VCs for directions of task throughout activity. She then completed grasping task with R UE from sitting position grasping and releasing horseshoes and crossing midline, completed with min A for gross  movements across midline and occasionally for grasping of item. .  Pt returned to w/c at end of session, hand off to SLP.   Therapy Documentation Precautions:  Precautions Precautions: Fall Precaution Comments: R side inattention; impulsive Restrictions Weight Bearing Restrictions: No Pain:   No/ denies pain  See Function Navigator for Current Functional Status.   Therapy/Group: Individual Therapy  Lewis, Elizabethann Lackey C 03/03/2017, 7:02 AM

## 2017-03-03 NOTE — Progress Notes (Signed)
Subjective/Complaints:   ROS: Limited due to cognition, but appears to deny CP, SOB, N/V/D.  Objective: Vital Signs: Blood pressure (!) 148/48, pulse 60, temperature 98.4 F (36.9 C), temperature source Oral, resp. rate 18, weight 53.1 kg (117 lb), SpO2 95 %. No results found. Results for orders placed or performed during the hospital encounter of 02/22/17 (from the past 72 hour(s))  Basic metabolic panel     Status: Abnormal   Collection Time: 03/01/17  5:54 AM  Result Value Ref Range   Sodium 138 135 - 145 mmol/L   Potassium 4.3 3.5 - 5.1 mmol/L   Chloride 101 101 - 111 mmol/L   CO2 26 22 - 32 mmol/L   Glucose, Bld 78 65 - 99 mg/dL   BUN 44 (H) 6 - 20 mg/dL   Creatinine, Ser 0.94 0.44 - 1.00 mg/dL   Calcium 10.1 8.9 - 10.3 mg/dL   GFR calc non Af Amer 59 (L) >60 mL/min   GFR calc Af Amer >60 >60 mL/min    Comment: (NOTE) The eGFR has been calculated using the CKD EPI equation. This calculation has not been validated in all clinical situations. eGFR's persistently <60 mL/min signify possible Chronic Kidney Disease.    Anion gap 11 5 - 15  CBC     Status: Abnormal   Collection Time: 03/01/17  7:10 AM  Result Value Ref Range   WBC 11.3 (H) 4.0 - 10.5 K/uL   RBC 2.97 (L) 3.87 - 5.11 MIL/uL   Hemoglobin 9.3 (L) 12.0 - 15.0 g/dL   HCT 27.4 (L) 36.0 - 46.0 %   MCV 92.3 78.0 - 100.0 fL   MCH 31.3 26.0 - 34.0 pg   MCHC 33.9 30.0 - 36.0 g/dL   RDW 13.0 11.5 - 15.5 %   Platelets 415 (H) 150 - 400 K/uL  Basic metabolic panel     Status: Abnormal   Collection Time: 03/03/17  5:13 AM  Result Value Ref Range   Sodium 133 (L) 135 - 145 mmol/L   Potassium 4.3 3.5 - 5.1 mmol/L   Chloride 103 101 - 111 mmol/L   CO2 22 22 - 32 mmol/L   Glucose, Bld 96 65 - 99 mg/dL   BUN 13 6 - 20 mg/dL   Creatinine, Ser 0.64 0.44 - 1.00 mg/dL   Calcium 8.6 (L) 8.9 - 10.3 mg/dL   GFR calc non Af Amer >60 >60 mL/min   GFR calc Af Amer >60 >60 mL/min    Comment: (NOTE) The eGFR has been  calculated using the CKD EPI equation. This calculation has not been validated in all clinical situations. eGFR's persistently <60 mL/min signify possible Chronic Kidney Disease.    Anion gap 8 5 - 15     HEENT: Normocephalic, atraumatic. Cardio: RRR and no JVD. +Murmur Resp: CTA B/L and Unlabored. +[place]. GI: BS positive ND Skin:   Intact. Warm and dry Neuro: Lethargic, Confused,  Motor: 3-/5 RUE (stable) 2-/5 RLE  Dysarthria Musc/Skel:  No tenderness, no edema Gen NAD. Vital signs reivewed   Assessment/Plan: 1. Functional deficits secondary to Left MCA infarct which require 3+ hours per day of interdisciplinary therapy in a comprehensive inpatient rehab setting. Physiatrist is providing close team supervision and 24 hour management of active medical problems listed below. Physiatrist and rehab team continue to assess barriers to discharge/monitor patient progress toward functional and medical goals. FIM: Function - Bathing Position: Shower Body parts bathed by patient: Chest, Abdomen, Left upper leg, Front perineal area,  Right arm, Left arm, Right upper leg Body parts bathed by helper: Buttocks, Right lower leg, Left lower leg, Back  Function- Upper Body Dressing/Undressing What is the patient wearing?: Bra, Pull over shirt/dress Bra - Perfomed by patient: Thread/unthread right bra strap, Thread/unthread left bra strap Bra - Perfomed by helper: Hook/unhook bra (pull down sports bra) Pull over shirt/dress - Perfomed by patient: Put head through opening, Thread/unthread left sleeve, Pull shirt over trunk Pull over shirt/dress - Perfomed by helper: Thread/unthread right sleeve Assist Level:  (mod a) Function - Lower Body Dressing/Undressing What is the patient wearing?: Pants, Socks, Shoes Position: Wheelchair/chair at [employer]- Performed by patient: Thread/unthread right pants leg, Thread/unthread left pants leg Pants- Performed by helper: Pull pants up/down Non-skid slipper  socks- Performed by patient: Don/doff right sock, Don/doff left sock Non-skid slipper socks- Performed by helper: Don/doff right sock, Don/doff left sock Socks - Performed by helper: Don/doff right sock, Don/doff left sock Shoes - Performed by helper: Don/doff right shoe, Don/doff left shoe Assist for footwear: Maximal assist Assist for lower body dressing:  (total)  Function - Toileting Toileting activity did not occur: No continent bowel/bladder event Toileting steps completed by helper: Adjust clothing prior to toileting, Performs perineal hygiene, Adjust clothing after toileting Toileting Assistive Devices: Grab bar or rail Assist level: Touching or steadying assistance (Pt.75%)  Function - Air cabin crew transfer assistive device: Grab bar Assist level to toilet: Moderate assist (Pt 50 - 74%/lift or lower) Assist level from toilet: Moderate assist (Pt 50 - 74%/lift or lower) Assist level to bedside commode (at bedside): 2 helpers (1 helper to assist with hygiene or clothing manipulation) Assist level from bedside commode (at bedside): 2 helpers  Function - Chair/bed transfer Chair/bed transfer method: Stand pivot Chair/bed transfer assist level: Moderate assist (Pt 50 - 74%/lift or lower) Chair/bed transfer assistive device: Armrests, Bedrails Chair/bed transfer details: Manual facilitation for weight shifting, Tactile cues for weight shifting, Tactile cues for posture, Tactile cues for placement, Tactile cues for initiation, Verbal cues for sequencing, Verbal cues for precautions/safety  Function - Locomotion: Wheelchair Will patient use wheelchair at discharge?: Yes Type: Manual Max wheelchair distance: 153f  Assist Level: Touching or steadying assistance (Pt > 75%) Assist Level: Touching or steadying assistance (Pt > 75%) Wheel 150 feet activity did not occur: Safety/medical concerns Function - Locomotion: Ambulation Assistive device: Other (comment) (shopping  cart) Max distance: 100 Assist level: Moderate assist (Pt 50 - 74%) Assist level: Moderate assist (Pt 50 - 74%) Walk 50 feet with 2 turns activity did not occur: Safety/medical concerns Assist level: Moderate assist (Pt 50 - 74%) Walk 150 feet activity did not occur: Safety/medical concerns Walk 10 feet on uneven surfaces activity did not occur: Safety/medical concerns  Function - Comprehension Comprehension: Auditory Comprehension assist level: Understands basic 50 - 74% of the time/ requires cueing 25 - 49% of the time  Function - Expression Expression: Verbal Expression assist level: Expresses basic 25 - 49% of the time/requires cueing 50 - 75% of the time. Uses single words/gestures.  Function - Social Interaction Social Interaction assist level: Interacts appropriately 50 - 74% of the time - May be physically or verbally inappropriate.  Function - Problem Solving Problem solving assist level: Solves basic 25 - 49% of the time - needs direction more than half the time to initiate, plan or complete simple activities  Function - Memory Memory assist level: Recognizes or recalls 25 - 49% of the time/requires cueing 50 - 75% of the  time Patient normally able to recall (first 3 days only): None of the above  Medical Problem List and Plan: 1.  Right hemiparesis and language deficits secondary to left MCA infarct  Cont CIR, PT, OT, SLP- tent d/c 8/18  2.  DVT Prophylaxis/Anticoagulation: Pharmaceutical: Xarelto and Plavix, high risk of bleeding, monitor 3. Pain Management: tylenol prn 4. Mood: LCSW to follow for evaluation and support. Sleep/wake cycle  5. Neuropsych: This patient is not capable of making decisions on her own behalf. 6. Skin/Wound Care: Routine pressure relief measures.  7. Fluids/Electrolytes/Nutrition: Monitor I/O. Normal BUN and creatinine on 02/23/2017, continue to monitor intake, variable meal intake 5-80% over the last several days- IVF at night, able to drink  but lacks awareness of thirst, no initiation 8. A fib: Monitor HR bid. Continue amiodarone and metoprolol for HR control.    10 Leukocytosis: Reactive to PNA and resolving with treatment. decreased to 11K on 7/31afebrile   Ucx No growth  Cont to monitor 11. Hypokalemia: Improving with supplement. Will continue to supplement for now.   K+ improved 4.3 on 8/2  Cont to monitor 12. ABLA: . Monitor for signs of bleeding. Hgb  reduced at 11.7 -> 9.3 13. Multiple Sclerosis: No medications? 14. H/o Depression: On Effexor and prozac.  88. H/o CAD s/p PTCA: On Lipitor  16. Lethargy:may be in part due to pre rtenal azotemia will start IVF   Recheck BMET improved         -trazodone prn  for insomnia, is sleeping better 17. Labile blood pressure no further med adjustments for now 8/1   Vitals:   03/02/17 1505 03/03/17 0501  BP: (!) 152/50 (!) 148/48  Pulse: 65 60  Resp: 17 18  Temp: 98.1 F (36.7 C) 98.4 F (36.9 C)   18. Hypoalbuminemia  Supplement initiated 7/28  LOS (Days) 9 A FACE TO FACE EVALUATION WAS PERFORMED  Elaysia Devargas E 03/03/2017, 8:33 AM

## 2017-03-04 ENCOUNTER — Inpatient Hospital Stay (HOSPITAL_COMMUNITY): Payer: Self-pay | Admitting: Occupational Therapy

## 2017-03-04 ENCOUNTER — Inpatient Hospital Stay (HOSPITAL_COMMUNITY): Payer: Medicare Other | Admitting: Speech Pathology

## 2017-03-04 ENCOUNTER — Inpatient Hospital Stay (HOSPITAL_COMMUNITY): Payer: Self-pay | Admitting: Physical Therapy

## 2017-03-04 LAB — CBC
HEMATOCRIT: 31.5 % — AB (ref 36.0–46.0)
HEMOGLOBIN: 10.7 g/dL — AB (ref 12.0–15.0)
MCH: 31.6 pg (ref 26.0–34.0)
MCHC: 34 g/dL (ref 30.0–36.0)
MCV: 92.9 fL (ref 78.0–100.0)
Platelets: 393 10*3/uL (ref 150–400)
RBC: 3.39 MIL/uL — ABNORMAL LOW (ref 3.87–5.11)
RDW: 13.8 % (ref 11.5–15.5)
WBC: 12.4 10*3/uL — ABNORMAL HIGH (ref 4.0–10.5)

## 2017-03-04 NOTE — Progress Notes (Signed)
Speech Language Pathology Daily Session Note  Patient Details  Name: Kaitlin [name] MRN: [id number] Date of Birth: 08/29/1945  Today's Date: 03/04/2017 SLP Individual Time: 1100-1200 SLP Individual Time Calculation (min): 60 min  Short Term Goals: Week 2: SLP Short Term Goal 1 (Week 2): Pt will utilize word finding strategies to name common objects with 85% accuracy and Mod A cues.  SLP Short Term Goal 2 (Week 2): Pt will utilize external memory aids to recall daily information (to include orientation information) with Mod A cues.  SLP Short Term Goal 3 (Week 2): Pt will sustain attention to basic functional task for ~ 15 minutes with Mod A. SLP Short Term Goal 4 (Week 2): Pt will complete basic familiar task during ADLs with Mod A cues.  SLP Short Term Goal 5 (Week 2): Pt will consume trials of dysphagia 3 without overt s/s of oral phase dysphagia with Min A cues for use of compensatory swallow strategies.  SLP Short Term Goal 6 (Week 2): Pt will utilize speech intelligibility strategies to achieve ~ 75% intelligibility at the word level with Mod A cues.   Skilled Therapeutic Interventions: Skilled treatment session focused on cognition and dysphagia goals. SLP facilitated session by providing Total A for arousal/alertness. Pt with initial inability to keep open eyes open during language and cognition tasks. However when presented with functional task (eating lunch tray) pt able to feed self with Max to Mod cues for continued initiation of task. SLP provided skilled observation of dysphagia 3 lunch tray with thin liquids. Pt consumed with good oral abilities and complete oral clearing. Recommend diet upgrade to dysphagia 3 with thin liquids. Pt returned to room, left with husband and all needs within reach. Continue current plan of care.      Function:  Eating Eating   Modified Consistency Diet: Yes Eating Assist Level: Set up assist for;Supervision or verbal cues   Eating Set Up Assist For:  Opening containers       Cognition Comprehension Comprehension assist level: Understands basic 50 - 74% of the time/ requires cueing 25 - 49% of the time  Expression   Expression assist level: Expresses basic 25 - 49% of the time/requires cueing 50 - 75% of the time. Uses single words/gestures.;Expresses basis less than 25% of the time/requires cueing >75% of the time.  Social Interaction Social Interaction assist level: Interacts appropriately 25 - 49% of time - Needs frequent redirection.;Interacts appropriately less than 25% of the time. May be withdrawn or combative.  Problem Solving Problem solving assist level: Solves basic 25 - 49% of the time - needs direction more than half the time to initiate, plan or complete simple activities;Solves basic less than 25% of the time - needs direction nearly all the time or does not effectively solve problems and may need a restraint for safety  Memory Memory assist level: Recognizes or recalls 25 - 49% of the time/requires cueing 50 - 75% of the time;Recognizes or recalls less than 25% of the time/requires cueing greater than 75% of the time    Pain    Therapy/Group: Individual Therapy  [place] 03/04/2017, 3:48 PM

## 2017-03-04 NOTE — Progress Notes (Signed)
Physical Therapy Session Note  Patient Details  Name: Kaitlin [name] MRN: [id number] Date of Birth: 1945/09/12  Today's Date: 03/04/2017 PT Individual Time: 1417-1530 PT Individual Time Calculation (min): 73 min   Short Term Goals: Week 2:  PT Short Term Goal 1 (Week 2): Pt will ambulate 125f with min assist and LRAD  PT Short Term Goal 2 (Week 2): Pt will propell WC 1046fwith supervision assist  PT Short Term Goal 3 (Week 2): Pt will perform bed mobility with supervision assist  PT Short Term Goal 4 (Week 2): Pt will perform bed<>WC transfers with min assist consistently.    Skilled Therapeutic Interventions/Progress Updates:   Pt received sitting in WC and agreeable to PT  [facility] AFO. Gait training instructed with shopping cart x 7068fith min assist. Additional gait training with also instructed with RW 3x 70 ft with mod assist progressing min assist and R hand support. PT applied Shoe cap to reduce friction with RLE swing through frrm toe drag. Noted improvement and step length with toe cap.   Variable gait training at rail in hall way forward/backward 3 x [id number] mod assist to walk backward and PT to facilitate RLE movement side stepping R and L [id number] direction with mod assist to facilitate RUE and LUE sequencing.   PT instructed WC mobility with use of RUE for propulsion to force use of the RUE. PT applied Tband to R wheel rim and provided Max-total assist for attention to the L as well as improved ROM, increased grasp and force produced with movement. Pt encouraged to look at R hand with propulsion, but appeared to be unable to keep UE in gaze.  [facility] returned too room and [facility] and all needs met.          Therapy Documentation Precautions:  Precautions Precautions: Fall Precaution Comments: R side inattention; impulsive Restrictions Weight Bearing Restrictions: No Vital  Signs: Therapy Vitals Temp: 97.7 F (36.5 C) Temp Source: Oral Pulse Rate: (!) 58 Resp: 16 BP: (!) 101/49 Patient Position (if appropriate): Sitting Oxygen Therapy SpO2: 100 % O2 Device: Not Delivered Pain: Pain Assessment Pain Assessment: No/denies pain   Therapy/Group: Individual Therapy  AustiLorie Phenix2018, 4:29 PM

## 2017-03-04 NOTE — Progress Notes (Signed)
Subjective/Complaints:  Patient just waking up, no complaints, husband at bedside, he has no concerns today.  ROS: Limited due to cognition, but appears to deny CP, SOB, N/V/D.  Objective: Vital Signs: Blood pressure (!) 152/59, pulse (!) 58, temperature 98.5 F (36.9 C), temperature source Oral, resp. rate 16, weight 53.1 kg (117 lb), SpO2 99 %. No results found. Results for orders placed or performed during the hospital encounter of 02/22/17 (from the past 72 hour(s))  Basic metabolic panel     Status: Abnormal   Collection Time: 03/03/17  5:13 AM  Result Value Ref Range   Sodium 133 (L) 135 - 145 mmol/L   Potassium 4.3 3.5 - 5.1 mmol/L   Chloride 103 101 - 111 mmol/L   CO2 22 22 - 32 mmol/L   Glucose, Bld 96 65 - 99 mg/dL   BUN 13 6 - 20 mg/dL   Creatinine, Ser 0.64 0.44 - 1.00 mg/dL   Calcium 8.6 (L) 8.9 - 10.3 mg/dL   GFR calc non Af Amer >60 >60 mL/min   GFR calc Af Amer >60 >60 mL/min    Comment: (NOTE) The eGFR has been calculated using the CKD EPI equation. This calculation has not been validated in all clinical situations. eGFR's persistently <60 mL/min signify possible Chronic Kidney Disease.    Anion gap 8 5 - 15  CBC     Status: Abnormal   Collection Time: 03/04/17  5:41 AM  Result Value Ref Range   WBC 12.4 (H) 4.0 - 10.5 K/uL   RBC 3.39 (L) 3.87 - 5.11 MIL/uL   Hemoglobin 10.7 (L) 12.0 - 15.0 g/dL   HCT 31.5 (L) 36.0 - 46.0 %   MCV 92.9 78.0 - 100.0 fL   MCH 31.6 26.0 - 34.0 pg   MCHC 34.0 30.0 - 36.0 g/dL   RDW 13.8 11.5 - 15.5 %   Platelets 393 150 - 400 K/uL     HEENT: Normocephalic, atraumatic. Cardio: RRR and no JVD. +Murmur Resp: CTA B/L and Unlabored. +[place]. GI: BS positive ND Skin:   Intact. Warm and dry Neuro: Lethargic, Confused,  Motor: 3-/5 RUE (stable) 2-/5 RLE  Dysarthria Musc/Skel:  No tenderness, no edema Gen NAD. Vital signs reivewed   Assessment/Plan: 1. Functional deficits secondary to Left MCA infarct which require 3+ hours  per day of interdisciplinary therapy in a comprehensive inpatient rehab setting. Physiatrist is providing close team supervision and 24 hour management of active medical problems listed below. Physiatrist and rehab team continue to assess barriers to discharge/monitor patient progress toward functional and medical goals. FIM: Function - Bathing Position: Shower Body parts bathed by patient: Chest, Abdomen, Left upper leg, Front perineal area, Right arm, Left arm, Right upper leg Body parts bathed by helper: Buttocks, Right lower leg, Left lower leg, Back  Function- Upper Body Dressing/Undressing What is the patient wearing?: Bra, Pull over shirt/dress Bra - Perfomed by patient: Thread/unthread right bra strap, Thread/unthread left bra strap Bra - Perfomed by helper: Hook/unhook bra (pull down sports bra) Pull over shirt/dress - Perfomed by patient: Put head through opening, Thread/unthread left sleeve, Pull shirt over trunk Pull over shirt/dress - Perfomed by helper: Thread/unthread right sleeve Assist Level:  (mod a) Function - Lower Body Dressing/Undressing What is the patient wearing?: Pants, Socks, Shoes Position: Sitting EOB Pants- Performed by patient: Thread/unthread right pants leg, Thread/unthread left pants leg Pants- Performed by helper: Thread/unthread right pants leg, Thread/unthread left pants leg, Pull pants up/down Non-skid slipper socks- Performed by  patient: Don/doff right sock, Don/doff left sock Non-skid slipper socks- Performed by helper: Don/doff right sock, Don/doff left sock Socks - Performed by helper: Don/doff right sock, Don/doff left sock Shoes - Performed by helper: Don/doff right shoe, Don/doff left shoe Assist for footwear: Maximal assist Assist for lower body dressing:  (total)  Function - Toileting Toileting activity did not occur: No continent bowel/bladder event Toileting steps completed by helper: Adjust clothing prior to toileting, Performs perineal  hygiene, Adjust clothing after toileting Toileting Assistive Devices: Grab bar or rail Assist level: Touching or steadying assistance (Pt.75%)  Function - Toilet Transfers Toilet transfer assistive device: Grab bar Assist level to toilet: Touching or steadying assistance (Pt > 75%) Assist level from toilet: Moderate assist (Pt 50 - 74%/lift or lower) Assist level to bedside commode (at bedside): 2 helpers (1 helper to assist with hygiene or clothing manipulation) Assist level from bedside commode (at bedside): 2 helpers  Function - Chair/bed transfer Chair/bed transfer method: Stand pivot Chair/bed transfer assist level: Touching or steadying assistance (Pt > 75%) Chair/bed transfer assistive device: Walker, Armrests Chair/bed transfer details: Manual facilitation for weight shifting, Tactile cues for weight shifting, Tactile cues for posture, Tactile cues for placement, Tactile cues for initiation, Verbal cues for sequencing, Verbal cues for precautions/safety  Function - Locomotion: Wheelchair Will patient use wheelchair at discharge?: Yes Type: Manual Max wheelchair distance: 147f  Assist Level: Touching or steadying assistance (Pt > 75%) Assist Level: Touching or steadying assistance (Pt > 75%) Wheel 150 feet activity did not occur: Safety/medical concerns Function - Locomotion: Ambulation Assistive device: Walker-rolling Max distance: 1065f Assist level: Moderate assist (Pt 50 - 74%) Assist level: Touching or steadying assistance (Pt > 75%) Walk 50 feet with 2 turns activity did not occur: Safety/medical concerns Assist level: Moderate assist (Pt 50 - 74%) Walk 150 feet activity did not occur: Safety/medical concerns Walk 10 feet on uneven surfaces activity did not occur: Safety/medical concerns  Function - Comprehension Comprehension: Auditory Comprehension assist level: Understands basic 50 - 74% of the time/ requires cueing 25 - 49% of the time  Function -  Expression Expression: Verbal Expression assist level: Expresses basic 25 - 49% of the time/requires cueing 50 - 75% of the time. Uses single words/gestures.  Function - Social Interaction Social Interaction assist level: Interacts appropriately 50 - 74% of the time - May be physically or verbally inappropriate.  Function - Problem Solving Problem solving assist level: Solves basic 25 - 49% of the time - needs direction more than half the time to initiate, plan or complete simple activities  Function - Memory Memory assist level: Recognizes or recalls 25 - 49% of the time/requires cueing 50 - 75% of the time Patient normally able to recall (first 3 days only): None of the above, Current season  Medical Problem List and Plan: 1.  Right hemiparesis and language deficits secondary to left MCA infarct  Cont CIR, PT, OT, SLP- tent d/c 8/18  2.  DVT Prophylaxis/Anticoagulation: Pharmaceutical: Xarelto and Plavix, high risk of bleeding, monitor 3. Pain Management: tylenol prn 4. Mood: LCSW to follow for evaluation and support. Sleep/wake cycle  5. Neuropsych: This patient is not capable of making decisions on her own behalf. 6. Skin/Wound Care: Routine pressure relief measures.  7. Fluids/Electrolytes/Nutrition: Monitor I/O. Normal BUN and creatinine on 02/23/2017, continue to monitor intake, variable meal intake 5-80% over the last several days- IVF at night, able to drink but lacks awareness of thirst, no initiation 8. A fib:  Monitor HR bid. Continue amiodarone and metoprolol for HR control.    10 Leukocytosis: Reactive to PNA and resolving with treatment. decreased to 11K on 7/31afebrile   Ucx No growth  Cont to monitor 11. Hypokalemia: Improving with supplement. Will continue to supplement for now.   K+ improved 4.3 on 8/2  Cont to monitor 12. ABLA: . Monitor for signs of bleeding. Hgb  reduced at 11.7 -> 9.3-> 10. 7 13. Multiple Sclerosis: No medications? 14. H/o Depression: On  Effexor and prozac.  94. H/o CAD s/p PTCA: On Lipitor  16. Lethargy:may be in part due to pre rtenal azotemia will start IVF   Recheck BMET improved         -trazodone prn  for insomnia, is sleeping better 17. Labile blood pressure no further med adjustments for now 8/3   Vitals:   03/03/17 2214 03/04/17 0700  BP: (!) 128/46 (!) 152/59  Pulse: 61 (!) 58  Resp:  16  Temp:     18. Hypoalbuminemia  Supplement initiated 7/28 19. Leukocytosis, has been mildly elevated, but no fever, continue to monitor, given urinary incontinence. Will repeat UA LOS (Days) 10 A FACE TO FACE EVALUATION WAS PERFORMED  KIRSTEINS,ANDREW E 03/04/2017, 10:59 AM

## 2017-03-04 NOTE — Progress Notes (Signed)
Occupational Therapy Session Note  Patient Details  Name: Kaitlin [name] MRN: [id number] Date of Birth: 1945/11/15  Today's Date: 03/04/2017 OT Individual Time: 2122-4825 OT Individual Time Calculation (min): 63 min    Short Term Goals: Week 2:  OT Short Term Goal 1 (Week 2): Pt will complete 1/3 toileting tasks with steadying assist OT Short Term Goal 2 (Week 2): Pt will thread R LE into pants with set-up assist and VCs OT Short Term Goal 3 (Week 2): Pt will use R UE at stabilizer level with min A during grooming tasks OT Short Term Goal 4 (Week 2): Pt will tolerate full dressing task with no more than 1 rest break in order to increase functional activity tolerance  Skilled Therapeutic Interventions/Progress Updates:    Pt received supine in bed, just finishing breakfast, agreeable to OT tx session. Pt's spouse present during session. Focus of session on ADL/self-care retraining, functional mobility transfers, and RUE NMR. Pt transfers to EOB with MinA, completes stand pivot transfers throughout session with ModA including EOB>w/c, w/c<>BSC over toilet, w/c<>BSC in shower. Pt completes toileting, completing perineal hygiene while sitting and MinA for clothing management (wearing nightgown). Pt completes bathing at shower level with assist for washing back, lower LEs, and buttocks using lateral leans. Intermittent Min steady assist during dynamic sitting for task completion and mod verbal cues for sequencing task completion throughout. Pt utilizes RUE to wash LUE, increased difficulty reaching to L shoulder. Pt completes dressing w/c level at sink. Pt threads bil UEs into bra, requires assist to bring over shoulders and fasten. Pt initiates problem solving to thread sleeve over RUE for donning shirt, ultimately requires MaxA to do so, Pt threads LUE and puts head through opening with assist to pull over trunk. Dons pants with assist to thread RLE into pants, minA to thread LLE, and Mod (progresses to  Min) steady assist standing at sink to advance over hips. Total A for donning socks. Pt left seated in w/c, QRB donned, call bell and needs within reach.   Therapy Documentation Precautions:  Precautions Precautions: Fall Precaution Comments: R side inattention; impulsive Restrictions Weight Bearing Restrictions: No   Pain: Pain Assessment Pain Assessment: No/denies pain  See Function Navigator for Current Functional Status.   Therapy/Group: Individual Therapy  Orlando Penner 03/04/2017, 3:58 PM

## 2017-03-05 ENCOUNTER — Inpatient Hospital Stay (HOSPITAL_COMMUNITY): Payer: Self-pay | Admitting: Physical Therapy

## 2017-03-05 LAB — URINALYSIS, ROUTINE W REFLEX MICROSCOPIC
Bacteria, UA: NONE SEEN
Bilirubin Urine: NEGATIVE
Glucose, UA: NEGATIVE mg/dL
Hgb urine dipstick: NEGATIVE
Ketones, ur: NEGATIVE mg/dL
Nitrite: NEGATIVE
Protein, ur: NEGATIVE mg/dL
SPECIFIC GRAVITY, URINE: 1.021 (ref 1.005–1.030)
SQUAMOUS EPITHELIAL / LPF: NONE SEEN
pH: 6 (ref 5.0–8.0)

## 2017-03-05 NOTE — Progress Notes (Signed)
Physical Therapy Session Note  Patient Details  Name: Kaitlin [name] MRN: [id number] Date of Birth: 24-Jan-1946  Today's Date: 03/05/2017 PT Individual Time: 1302-1400 PT Individual Time Calculation (min): 58 min   Short Term Goals: Week 2:  PT Short Term Goal 1 (Week 2): Pt will ambulate 129f with min assist and LRAD  PT Short Term Goal 2 (Week 2): Pt will propell WC 1067fwith supervision assist  PT Short Term Goal 3 (Week 2): Pt will perform bed mobility with supervision assist  PT Short Term Goal 4 (Week 2): Pt will perform bed<>WC transfers with min assist consistently.    Skilled Therapeutic Interventions/Progress Updates:   Pt received supine in bed and agreeable to PT. Supine>sit transfer with min assist and moderate cues for sequencing. PT donned shoes of RAFO sitting EOB. Min cues to prevent posterior LOB.  PT instructed pt in sit<>stand at EOB with RW and attempted ambulatory [facility] to [facility] noted to have significant posterior lean and R lateral push requiring max assist to transfer to WC.   Gait training with Shopping cart progressing to RWIdalouith mod assist progressing to min assist, 12562fotal.   Gait without AD in fuctional context to water indoor tree. PT provided mod-max assist with max cues for attention to RUE to hold watering pot and max cues for improved step length and height with RLE.   Bed mobility instructed by PT with min assist to control the LLE into supine and min assist from initiation of trunk movement to come to sitting EOB.   Supine NMR Bridges x 10 BLE and SAQ x 8 BLE. Pt with decreased arousal in supine and unable to perform additional exercises due to poor arousal.   Sit<>stand and standing balance training witout UE supoprt. Max assist from PT with awareness cues to postural limitations and use of hip strategy to prevent posterior LOB. Stand pivot transfer back to WC [facility] min-mod assist form PT and multimodal cues for sequencing.   Patient returned  too room and left [facility] in [facility] reach and all needs met.           Therapy Documentation Precautions:  Precautions Precautions: Fall Precaution Comments: R side inattention; impulsive Restrictions Weight Bearing Restrictions: No Pain: 0/10   See Function Navigator for Current Functional Status.   Therapy/Group: Individual Therapy  AusLorie Phenix4/2018, 2:04 PM

## 2017-03-05 NOTE — Progress Notes (Signed)
Subjective/Complaints:  Patient recognizes me but does not remember my name, visiting with husband and a friend ROS: Limited due to cognition, but appears to deny CP, SOB, N/V/D.  Objective: Vital Signs: Blood pressure (!) 123/46, pulse (!) 111, temperature 97.6 F (36.4 C), temperature source Oral, resp. rate 12, weight 53.1 kg (117 lb), SpO2 98 %. No results found. Results for orders placed or performed during the hospital encounter of 02/22/17 (from the past 72 hour(s))  Basic metabolic panel     Status: Abnormal   Collection Time: 03/03/17  5:13 AM  Result Value Ref Range   Sodium 133 (L) 135 - 145 mmol/L   Potassium 4.3 3.5 - 5.1 mmol/L   Chloride 103 101 - 111 mmol/L   CO2 22 22 - 32 mmol/L   Glucose, Bld 96 65 - 99 mg/dL   BUN 13 6 - 20 mg/dL   Creatinine, Ser 0.64 0.44 - 1.00 mg/dL   Calcium 8.6 (L) 8.9 - 10.3 mg/dL   GFR calc non Af Amer >60 >60 mL/min   GFR calc Af Amer >60 >60 mL/min    Comment: (NOTE) The eGFR has been calculated using the CKD EPI equation. This calculation has not been validated in all clinical situations. eGFR's persistently <60 mL/min signify possible Chronic Kidney Disease.    Anion gap 8 5 - 15  Urinalysis, Routine w reflex microscopic     Status: Abnormal   Collection Time: 03/04/17  4:50 AM  Result Value Ref Range   Color, Urine YELLOW YELLOW   APPearance HAZY (A) CLEAR   Specific Gravity, Urine 1.021 1.005 - 1.030   pH 6.0 5.0 - 8.0   Glucose, UA NEGATIVE NEGATIVE mg/dL   Hgb urine dipstick NEGATIVE NEGATIVE   Bilirubin Urine NEGATIVE NEGATIVE   Ketones, ur NEGATIVE NEGATIVE mg/dL   Protein, ur NEGATIVE NEGATIVE mg/dL   Nitrite NEGATIVE NEGATIVE   Leukocytes, UA TRACE (A) NEGATIVE   RBC / HPF 0-5 0 - 5 RBC/hpf   WBC, UA 6-30 0 - 5 WBC/hpf   Bacteria, UA NONE SEEN NONE SEEN   Squamous Epithelial / LPF NONE SEEN NONE SEEN   Mucous PRESENT    Hyaline Casts, UA PRESENT   CBC     Status: Abnormal   Collection Time: 03/04/17  5:41  AM  Result Value Ref Range   WBC 12.4 (H) 4.0 - 10.5 K/uL   RBC 3.39 (L) 3.87 - 5.11 MIL/uL   Hemoglobin 10.7 (L) 12.0 - 15.0 g/dL   HCT 31.5 (L) 36.0 - 46.0 %   MCV 92.9 78.0 - 100.0 fL   MCH 31.6 26.0 - 34.0 pg   MCHC 34.0 30.0 - 36.0 g/dL   RDW 13.8 11.5 - 15.5 %   Platelets 393 150 - 400 K/uL     HEENT: Normocephalic, atraumatic. Cardio: RRR and no JVD. +Murmur Resp: CTA B/L and Unlabored. +[place]. GI: BS positive ND Skin:   Intact. Warm and dry Neuro: Lethargic, Confused,  Motor: 3-/5 RUE (stable) 2-/5 RLE  Dysarthria Musc/Skel:  No tenderness, no edema Gen NAD. Vital signs reivewed   Assessment/Plan: 1. Functional deficits secondary to Left MCA infarct which require 3+ hours per day of interdisciplinary therapy in a comprehensive inpatient rehab setting. Physiatrist is providing close team supervision and 24 hour management of active medical problems listed below. Physiatrist and rehab team continue to assess barriers to discharge/monitor patient progress toward functional and medical goals. FIM: Function - Bathing Position: Shower Body parts bathed by  patient: Chest, Abdomen, Left upper leg, Front perineal area, Right arm, Left arm, Right upper leg Body parts bathed by helper: Buttocks, Right lower leg, Left lower leg, Back Assist Level: Touching or steadying assistance(Pt > 75%)  Function- Upper Body Dressing/Undressing What is the patient wearing?: Bra, Pull over shirt/dress Bra - Perfomed by patient: Thread/unthread right bra strap Bra - Perfomed by helper: Thread/unthread left bra strap, Hook/unhook bra (pull down sports bra) Pull over shirt/dress - Perfomed by patient: Put head through opening, Thread/unthread left sleeve Pull over shirt/dress - Perfomed by helper: Thread/unthread right sleeve, Pull shirt over trunk Assist Level:  (ModA) Function - Lower Body Dressing/Undressing What is the patient wearing?: Pants, Socks Position: Sitting EOB Pants- Performed by  patient: Thread/unthread left pants leg (Pt completes 50% threading LLE) Pants- Performed by helper: Thread/unthread right pants leg, Thread/unthread left pants leg, Pull pants up/down Non-skid slipper socks- Performed by patient: Don/doff right sock, Don/doff left sock Non-skid slipper socks- Performed by helper: Don/doff right sock, Don/doff left sock Socks - Performed by helper: Don/doff right sock, Don/doff left sock Shoes - Performed by helper: Don/doff right shoe, Don/doff left shoe Assist for footwear: Maximal assist Assist for lower body dressing:  (MaxA)  Function - Toileting Toileting activity did not occur: No continent bowel/bladder event Toileting steps completed by patient: Performs perineal hygiene Toileting steps completed by helper: Adjust clothing prior to toileting, Adjust clothing after toileting Toileting Assistive Devices: Grab bar or rail Assist level:  (ModA)  Function - Air cabin crew transfer assistive device: Grab bar, Elevated toilet seat/BSC over toilet Assist level to toilet: Moderate assist (Pt 50 - 74%/lift or lower) Assist level from toilet: Moderate assist (Pt 50 - 74%/lift or lower) Assist level to bedside commode (at bedside): 2 helpers (1 helper to assist with hygiene or clothing manipulation) Assist level from bedside commode (at bedside): 2 helpers  Function - Chair/bed transfer Chair/bed transfer method: Stand pivot Chair/bed transfer assist level: Moderate assist (Pt 50 - 74%/lift or lower) Chair/bed transfer assistive device: Walker, Armrests Chair/bed transfer details: Manual facilitation for weight shifting, Tactile cues for weight shifting, Tactile cues for posture, Tactile cues for placement, Tactile cues for initiation, Verbal cues for sequencing, Verbal cues for precautions/safety  Function - Locomotion: Wheelchair Will patient use wheelchair at discharge?: Yes Type: Manual Max wheelchair distance: 149f  Assist Level: Touching  or steadying assistance (Pt > 75%) Assist Level: Touching or steadying assistance (Pt > 75%) Wheel 150 feet activity did not occur: Safety/medical concerns Function - Locomotion: Ambulation Assistive device: Walker-rolling Max distance: 1041f Assist level: Moderate assist (Pt 50 - 74%) Assist level: Touching or steadying assistance (Pt > 75%) Walk 50 feet with 2 turns activity did not occur: Safety/medical concerns Assist level: Moderate assist (Pt 50 - 74%) Walk 150 feet activity did not occur: Safety/medical concerns Walk 10 feet on uneven surfaces activity did not occur: Safety/medical concerns  Function - Comprehension Comprehension: Auditory Comprehension assist level: Understands basic 50 - 74% of the time/ requires cueing 25 - 49% of the time  Function - Expression Expression: Verbal Expression assist level: Expresses basic 25 - 49% of the time/requires cueing 50 - 75% of the time. Uses single words/gestures., Expresses basis less than 25% of the time/requires cueing >75% of the time.  Function - Social Interaction Social Interaction assist level: Interacts appropriately 25 - 49% of time - Needs frequent redirection., Interacts appropriately less than 25% of the time. May be withdrawn or combative.  Function -  Problem Solving Problem solving assist level: Solves basic 25 - 49% of the time - needs direction more than half the time to initiate, plan or complete simple activities, Solves basic less than 25% of the time - needs direction nearly all the time or does not effectively solve problems and may need a restraint for safety  Function - Memory Memory assist level: Recognizes or recalls 25 - 49% of the time/requires cueing 50 - 75% of the time, Recognizes or recalls less than 25% of the time/requires cueing greater than 75% of the time Patient normally able to recall (first 3 days only): None of the above, Current season  Medical Problem List and Plan: 1.  Right hemiparesis  and language deficits secondary to left MCA infarct  Cont CIR, PT, OT, SLP- tent d/c 8/18  2.  DVT Prophylaxis/Anticoagulation: Pharmaceutical: Xarelto and Plavix, high risk of bleeding, monitor 3. Pain Management: tylenol prn 4. Mood: LCSW to follow for evaluation and support. Sleep/wake cycle  5. Neuropsych: This patient is not capable of making decisions on her own behalf. 6. Skin/Wound Care: Routine pressure relief measures.  7. Fluids/Electrolytes/Nutrition: Monitor I/O. Normal BUN and creatinine on 02/23/2017, continue to monitor intake, variable meal intake 5-80% over the last several days- IVF at night, able to drink but lacks awareness of thirst, no initiation 8. A fib: Monitor HR bid. Continue amiodarone and metoprolol for HR control.    10 Leukocytosis: Reactive to PNA and resolving with treatment. decreased to 11K on 7/31afebrile   Ucx No growth  Cont to monitor 11. Hypokalemia: Improving with supplement. Will continue to supplement for now.   K+ improved 4.3 on 8/2  Cont to monitor 12. ABLA: . Monitor for signs of bleeding. Hgb  reduced at 11.7 -> 9.3-> 10. 7 13. Multiple Sclerosis: No medications? 14. H/o Depression: On Effexor and prozac.  10. H/o CAD s/p PTCA: On Lipitor  16. Lethargy:may be in part due to pre rtenal azotemia will start IVF   Recheck BMET improved         -trazodone prn  for insomnia, is sleeping better 17. Labile blood pressure no further med adjustments for now 8/3   Vitals:   03/04/17 1935 03/05/17 0345  BP: (!) 106/43 (!) 123/46  Pulse: 62 (!) 111  Resp:  12  Temp:  97.6 F (36.4 C)   18. Hypoalbuminemia  Supplement initiated 7/28 19. Leukocytosis, has been mildly elevated, but no fever, continue to monitor, given urinary incontinence. Will repeat UA  Tachycardia noted on vitals. However, on exam today does not coincide LOS (Days) [place] EVALUATION WAS PERFORMED  Miklo Aken E 03/05/2017, 12:05 PM

## 2017-03-06 ENCOUNTER — Inpatient Hospital Stay (HOSPITAL_COMMUNITY): Payer: Medicare Other | Admitting: Physical Therapy

## 2017-03-06 DIAGNOSIS — I69320 Aphasia following cerebral infarction: Secondary | ICD-10-CM

## 2017-03-06 DIAGNOSIS — I69919 Unspecified symptoms and signs involving cognitive functions following unspecified cerebrovascular disease: Secondary | ICD-10-CM

## 2017-03-06 MED ORDER — SODIUM CHLORIDE 0.45 % IV SOLN
INTRAVENOUS | Status: DC
  Administered 2017-03-06 – 2017-03-12 (×4): via INTRAVENOUS

## 2017-03-06 NOTE — Progress Notes (Signed)
Physical Therapy Session Note  Patient Details  Name: Kaitlin [name] MRN: [id number] Date of Birth: 07/15/1946  Today's Date: 03/06/2017 PT Individual Time: 1000-1100 PT Individual Time Calculation (min): 60 min   Short Term Goals: Week 1:  PT Short Term Goal 1 (Week 1): Pt will perform bed mobility with min assist 75% of trials PT Short Term Goal 1 - Progress (Week 1): Met PT Short Term Goal 2 (Week 1): Pt will perform squat pivot transfer with mod assist  PT Short Term Goal 2 - Progress (Week 1): Met PT Short Term Goal 3 (Week 1): Pt will ambulate 50ft with mod assist and LRAD  PT Short Term Goal 3 - Progress (Week 1): Met PT Short Term Goal 4 (Week 1): Pt will propell WC 100ft with min assist  PT Short Term Goal 4 - Progress (Week 1): Met  Skilled Therapeutic Interventions/Progress Updates:  Pt was seen bedside in the am. Pt propelled w/c about 15 feet with mod A and verbal cues. Pt performed multiple sit to stand transfers with mod A and verbal cues. Pt ambulated 60 feet x 2 with rolling walker, R hand orthosis, R AFO with mod A and verbal cues. Pt ambulated 30 feet x 2 with SBQC and min to mod A with verbal cues. Pt performed block practice sit to stand transfers 3 sets x 5 reps each. Pt returned to room following treatment and left sitting up in w/c with quick release belt in place.   Therapy Documentation Precautions:  Precautions Precautions: Fall Precaution Comments: R side inattention; impulsive Restrictions Weight Bearing Restrictions: No General:   Pain: No c/o pain.      See Function Navigator for Current Functional Status.   Therapy/Group: Individual Therapy  ,  G 03/06/2017, 12:11 PM  

## 2017-03-06 NOTE — Progress Notes (Signed)
Subjective/Complaints:  Patient participating well with PT, has difficulty with sequencing during gait. ROS: Limited due to cognition, but appears to deny CP, SOB, N/V/D.  Objective: Vital Signs: Blood pressure (!) 160/54, pulse 60, temperature 98.1 F (36.7 C), temperature source Oral, resp. rate 18, weight 53.1 kg (117 lb), SpO2 97 %. No results found. Results for orders placed or performed during the hospital encounter of 02/22/17 (from the past 72 hour(s))  Urinalysis, Routine w reflex microscopic     Status: Abnormal   Collection Time: 03/04/17  4:50 AM  Result Value Ref Range   Color, Urine YELLOW YELLOW   APPearance HAZY (A) CLEAR   Specific Gravity, Urine 1.021 1.005 - 1.030   pH 6.0 5.0 - 8.0   Glucose, UA NEGATIVE NEGATIVE mg/dL   Hgb urine dipstick NEGATIVE NEGATIVE   Bilirubin Urine NEGATIVE NEGATIVE   Ketones, ur NEGATIVE NEGATIVE mg/dL   Protein, ur NEGATIVE NEGATIVE mg/dL   Nitrite NEGATIVE NEGATIVE   Leukocytes, UA TRACE (A) NEGATIVE   RBC / HPF 0-5 0 - 5 RBC/hpf   WBC, UA 6-30 0 - 5 WBC/hpf   Bacteria, UA NONE SEEN NONE SEEN   Squamous Epithelial / LPF NONE SEEN NONE SEEN   Mucous PRESENT    Hyaline Casts, UA PRESENT   CBC     Status: Abnormal   Collection Time: 03/04/17  5:41 AM  Result Value Ref Range   WBC 12.4 (H) 4.0 - 10.5 K/uL   RBC 3.39 (L) 3.87 - 5.11 MIL/uL   Hemoglobin 10.7 (L) 12.0 - 15.0 g/dL   HCT 60.4 (L) 54.0 - 98.1 %   MCV 92.9 78.0 - 100.0 fL   MCH 31.6 26.0 - 34.0 pg   MCHC 34.0 30.0 - 36.0 g/dL   RDW 19.1 47.8 - 29.5 %   Platelets 393 150 - 400 K/uL     HEENT: Normocephalic, atraumatic.  Neuro: Lethargic, Confused,  Motor: 3-/5 RUE (stable) 2-/5 RLE - Apraxia noted Left upper extremity. Left lower extremity 4 plus Dysarthria Musc/Skel:  No tenderness, no edema Gen NAD. Vital signs reivewed   Assessment/Plan: 1. Functional deficits secondary to Left MCA infarct which require 3+ hours per day of interdisciplinary therapy in  a comprehensive inpatient rehab setting. Physiatrist is providing close team supervision and 24 hour management of active medical problems listed below. Physiatrist and rehab team continue to assess barriers to discharge/monitor patient progress toward functional and medical goals. FIM: Function - Bathing Position: Shower Body parts bathed by patient: Chest, Abdomen, Left upper leg, Front perineal area, Right arm, Left arm, Right upper leg Body parts bathed by helper: Buttocks, Right lower leg, Left lower leg, Back Assist Level: Touching or steadying assistance(Pt > 75%)  Function- Upper Body Dressing/Undressing What is the patient wearing?: Bra, Pull over shirt/dress Bra - Perfomed by patient: Thread/unthread right bra strap Bra - Perfomed by helper: Thread/unthread left bra strap, Hook/unhook bra (pull down sports bra) Pull over shirt/dress - Perfomed by patient: Put head through opening, Thread/unthread left sleeve Pull over shirt/dress - Perfomed by helper: Thread/unthread right sleeve, Pull shirt over trunk Assist Level:  (ModA) Function - Lower Body Dressing/Undressing What is the patient wearing?: Pants, Socks Position: Sitting EOB Pants- Performed by patient: Thread/unthread left pants leg (Pt completes 50% threading LLE) Pants- Performed by helper: Thread/unthread right pants leg, Thread/unthread left pants leg, Pull pants up/down Non-skid slipper socks- Performed by patient: Don/doff right sock, Don/doff left sock Non-skid slipper socks- Performed by helper: Don/doff right  sock, Don/doff left sock Socks - Performed by helper: Don/doff right sock, Don/doff left sock Shoes - Performed by helper: Don/doff right shoe, Don/doff left shoe Assist for footwear: Maximal assist Assist for lower body dressing:  (MaxA)  Function - Toileting Toileting activity did not occur: No continent bowel/bladder event Toileting steps completed by patient: Performs perineal hygiene Toileting steps  completed by helper: Adjust clothing prior to toileting, Adjust clothing after toileting Toileting Assistive Devices: Grab bar or rail Assist level:  (ModA)  Function - Archivist transfer assistive device: Grab bar, Elevated toilet seat/BSC over toilet Assist level to toilet: Moderate assist (Pt 50 - 74%/lift or lower) Assist level from toilet: Moderate assist (Pt 50 - 74%/lift or lower) Assist level to bedside commode (at bedside): 2 helpers (1 helper to assist with hygiene or clothing manipulation) Assist level from bedside commode (at bedside): 2 helpers  Function - Chair/bed transfer Chair/bed transfer method: Stand pivot Chair/bed transfer assist level: Moderate assist (Pt 50 - 74%/lift or lower) Chair/bed transfer assistive device: Walker Chair/bed transfer details: Manual facilitation for weight shifting, Tactile cues for weight shifting, Tactile cues for posture, Tactile cues for placement, Tactile cues for initiation, Verbal cues for sequencing, Verbal cues for precautions/safety  Function - Locomotion: Wheelchair Will patient use wheelchair at discharge?: Yes Type: Manual Max wheelchair distance: 139ft  Assist Level: Moderate assistance (Pt 50 - 74%) Assist Level: Moderate assistance (Pt 50 - 74%) Wheel 150 feet activity did not occur: Safety/medical concerns Function - Locomotion: Ambulation Assistive device: Walker-rolling, Other (comment) Max distance: 125 Assist level: Moderate assist (Pt 50 - 74%) Assist level: Moderate assist (Pt 50 - 74%) Walk 50 feet with 2 turns activity did not occur: Safety/medical concerns Assist level: Moderate assist (Pt 50 - 74%) Walk 150 feet activity did not occur: Safety/medical concerns Walk 10 feet on uneven surfaces activity did not occur: Safety/medical concerns  Function - Comprehension Comprehension: Auditory Comprehension assist level: Understands basic 50 - 74% of the time/ requires cueing 25 - 49% of the  time  Function - Expression Expression: Verbal Expression assist level: Expresses basic 25 - 49% of the time/requires cueing 50 - 75% of the time. Uses single words/gestures.  Function - Social Interaction Social Interaction assist level: Interacts appropriately 50 - 74% of the time - May be physically or verbally inappropriate.  Function - Problem Solving Problem solving assist level: Solves basic 25 - 49% of the time - needs direction more than half the time to initiate, plan or complete simple activities  Function - Memory Memory assist level: Recognizes or recalls 25 - 49% of the time/requires cueing 50 - 75% of the time Patient normally able to recall (first 3 days only): None of the above  Medical Problem List and Plan: 1.  Right hemiparesis and language deficits secondary to left MCA infarct  Cont CIR, PT, OT, SLP- tent d/c 8/18  2.  DVT Prophylaxis/Anticoagulation: Pharmaceutical: Xarelto and Plavix, high risk of bleeding, monitor 3. Pain Management: tylenol prn 4. Mood: LCSW to follow for evaluation and support. Sleep/wake cycle  5. Neuropsych: This patient is not capable of making decisions on her own behalf. 6. Skin/Wound Care: Routine pressure relief measures.  7. Fluids/Electrolytes/Nutrition: Monitor I/O. Normal BUN and creatinine on 02/23/2017, continue to monitor intake, variable meal intake 5-80% over the last several days- IVF at night, able to drink but lacks awareness of thirst, no initiation 8. A fib: Monitor HR bid. Continue amiodarone and metoprolol for HR control.  10 Leukocytosis: Reactive to PNA and resolving with treatment. decreased to 12K on 8/3afebrile   Ucx No growth  Cont to monitor 11. Hypokalemia: Improving with supplement. Will continue to supplement for now.   K+ improved 4.3 on 8/2  Cont to monitor 12. ABLA: . Monitor for signs of bleeding. Hgb  reduced at 11.7 -> 9.3-> 10. 7 13. Multiple Sclerosis: No medications? 14. H/o Depression: On  Effexor and prozac.  15. H/o CAD s/p PTCA: On Lipitor  16. Lethargy:may be in part due to pre rtenal azotemia will start IVF   Recheck BMET improved         -trazodone prn  for insomnia, is sleeping better 17. Labile blood pressure no further med adjustments for now 8/3   Vitals:   03/05/17 1442 03/06/17 0611  BP: (!) 103/40 (!) 160/54  Pulse:  60  Resp:  18  Temp:  98.1 F (36.7 C)   18. Hypoalbuminemia  Supplement initiated 7/28 19. Leukocytosis, has been mildly elevated, but no fever, continue to monitor, given urinary incontinence. repeat UA neg, repeat CBC 8/ 6   LOS (Days) 12 A FACE TO FACE EVALUATION WAS PERFORMED  Kaitlin [name] 03/06/2017, 10:48 AM

## 2017-03-07 ENCOUNTER — Inpatient Hospital Stay (HOSPITAL_COMMUNITY): Payer: Self-pay | Admitting: Physical Therapy

## 2017-03-07 ENCOUNTER — Inpatient Hospital Stay (HOSPITAL_COMMUNITY): Payer: Self-pay | Admitting: Occupational Therapy

## 2017-03-07 ENCOUNTER — Inpatient Hospital Stay (HOSPITAL_COMMUNITY): Payer: Medicare Other | Admitting: Speech Pathology

## 2017-03-07 LAB — BASIC METABOLIC PANEL
ANION GAP: 11 (ref 5–15)
BUN: 18 mg/dL (ref 6–20)
CALCIUM: 8.8 mg/dL — AB (ref 8.9–10.3)
CHLORIDE: 104 mmol/L (ref 101–111)
CO2: 20 mmol/L — AB (ref 22–32)
Creatinine, Ser: 0.74 mg/dL (ref 0.44–1.00)
GFR calc non Af Amer: >60 mL/min (ref >60)
Glucose, Bld: 90 mg/dL (ref 65–99)
Potassium: 5.1 mmol/L (ref 3.5–5.1)
Sodium: 135 mmol/L (ref 135–145)

## 2017-03-07 LAB — CBC
HCT: 29.7 % — ABNORMAL LOW (ref 36.0–46.0)
HEMOGLOBIN: 10.1 g/dL — AB (ref 12.0–15.0)
MCH: 32 pg (ref 26.0–34.0)
MCHC: 34 g/dL (ref 30.0–36.0)
MCV: 94 fL (ref 78.0–100.0)
Platelets: 347 10*3/uL (ref 150–400)
RBC: 3.16 MIL/uL — AB (ref 3.87–5.11)
RDW: 13.9 % (ref 11.5–15.5)
WBC: 9.7 10*3/uL (ref 4.0–10.5)

## 2017-03-07 MED ORDER — CHLORHEXIDINE GLUCONATE 0.12 % MT SOLN
OROMUCOSAL | Status: AC
  Administered 2017-03-07: 15 mL via OROMUCOSAL
  Filled 2017-03-07: qty 15

## 2017-03-07 NOTE — Progress Notes (Signed)
Occupational Therapy Session Note  Patient Details  Name: Kaitlin [name] MRN: [id number] Date of Birth: 23-Jan-1946  Today's Date: 03/07/2017 OT Individual Time: 0830-0930 OT Individual Time Calculation (min): 60 min    Short Term Goals: Week 2:  OT Short Term Goal 1 (Week 2): Pt will complete 1/3 toileting tasks with steadying assist OT Short Term Goal 2 (Week 2): Pt will thread R LE into pants with set-up assist and VCs OT Short Term Goal 3 (Week 2): Pt will use R UE at stabilizer level with min A during grooming tasks OT Short Term Goal 4 (Week 2): Pt will tolerate full dressing task with no more than 1 rest break in order to increase functional activity tolerance  Skilled Therapeutic Interventions/Progress Updates:     Pt seen for OT ADL bathing/dressing session. Pt in supine upon arrival, alert and husband present. Pt oriented to person only, once re-oriented she was able to recall orientation with 50% accuracy and min questioning cues.  Throughout session, she completed min A stand pivot transfers with VCs for hand placement on grab bar and assist to position hips with VCs as pt attempts to sit pre-maturely. She completed toileting hygiene from sitting position, automatically using dominant though weaker R UE to complete hygiene, though very apraxic with movements. She bathed seated on 3-1 BSC in shower, min A to complete full ROM with R UE to wash L UE and VCs required for attending to all body parts.  She was able to gather clothing items from w/c level, requiring assist for word finding of all clothing items. She was able to problem solve clothing management with increased time though required assist for donning bra and shirt due to R inattention. Pt stood at sink to pull pants up, R lean though able to self correct with VCs and use of mirror for visual feedback of midline.  Grooming tasks completed from w/c level, pt able to locate all needed items and assist provided for setting up of  oral care. Pt left seated in w/c at end of session, QRB donned and husband present.  Pt's husband educated throughout session regarding pt progression, rate of return, continuum of care, DME, and d/c planning. Pt's husband asking about her return to driving.   Therapy Documentation Precautions:  Precautions Precautions: Fall Precaution Comments: R side inattention; impulsive Restrictions Weight Bearing Restrictions: No Pain:   No/ denies pain  See Function Navigator for Current Functional Status.   Therapy/Group: Individual Therapy  Lewis, Elizibeth Breau C 03/07/2017, 7:10 AM

## 2017-03-07 NOTE — Progress Notes (Signed)
Physical Therapy Session Note  Patient Details  Name: Kaitlin [name] MRN: [id number] Date of Birth: Dec 18, 1945  Today's Date: 03/07/2017 PT Individual Time: 1100-1200 PT Individual Time Calculation (min): 60 min   Short Term Goals: Week 2:  PT Short Term Goal 1 (Week 2): Pt will ambulate 141f with min assist and LRAD  PT Short Term Goal 2 (Week 2): Pt will propell WC 1069fwith supervision assist  PT Short Term Goal 3 (Week 2): Pt will perform bed mobility with supervision assist  PT Short Term Goal 4 (Week 2): Pt will perform bed<>WC transfers with min assist consistently.    Skilled Therapeutic Interventions/Progress Updates: Pt presented in w/c agreeable to therapy. Performed w/c mobility x 7583fith modA and additional time. Pt performed bouts of use of BLE propulsion. Performed sit to stand x 5 with tactile cues for LUE placement and increased anterior wt shift. Pt required max cues due to poor carryover with decreased posterior lean noted. Ambulated with RW and hand orthotic [id number] 30ft56f with minA and verbal cues for RLE placement. Pt would demonstrated increased R foot "catching" with fatigue but able to correct with verbal cues. Performed ambulatory transfer to NuStep with HHA and modA. Performed NuStep L3 x 8 min for NMR and reciprocal pattern., Pt returned to w/c in same manner as prior, and propelled additional 50ft 34firing mod/max cues. Pt remained in w/c at end of session with husband present and all current needs met.      Therapy Documentation Precautions:  Precautions Precautions: Fall Precaution Comments: R side inattention; impulsive Restrictions Weight Bearing Restrictions: No General:   Vital Signs: Therapy Vitals Temp: 97.9 F (36.6 C) Temp Source: Oral Pulse Rate: (!) 50 Resp: 16 BP: (!) 126/56 Patient Position (if appropriate): Sitting Oxygen Therapy SpO2: 95 % O2 Device: Not Delivered Pain: Pain Assessment Pain Assessment: No/denies  pain   See Function Navigator for Current Functional Status.   Therapy/Group: Individual Therapy  Hodan Wurtz  May Manrique, PTA  03/07/2017, 3:45 PM

## 2017-03-07 NOTE — Progress Notes (Signed)
Physical Therapy Session Note  Patient Details  Name: Kaitlin [name] MRN: [id number] Date of Birth: [birth year]  Today's Date: 03/07/2017 PT Individual Time: 1430-1500 PT Individual Time Calculation (min): 30 min   Short Term Goals: Week 2:  PT Short Term Goal 1 (Week 2): Pt will ambulate 186ft with min assist and LRAD  PT Short Term Goal 2 (Week 2): Pt will propell WC 165ft with supervision assist  PT Short Term Goal 3 (Week 2): Pt will perform bed mobility with supervision assist  PT Short Term Goal 4 (Week 2): Pt will perform bed<>WC transfers with min assist consistently.    Skilled Therapeutic Interventions/Progress Updates: Pt received seated in w/c with handoff from NT; denies pain and agreeable to treatment. Transported to/from gym with totalA for energy conservation. Dynamic gait with RW weaving around cones for focus on RLE clearance, balance, RW management with min/modA overall; increased assist needed when turning to L side d/t R lateral lean and poor RLE foot clearance and placement. Standing balance with RLE toe taps to 6" step for focus on L weight shifting to improve RLE foot clearance. First trial performed with BUE support on RW, second trial with LUE over therapist's shoulder, third trial no UE support. Pt with significant weight shifting impairments, several LOBs to R side when attempting to lift RLE, does not improve with visual demonstration or instruction, however small improvements when providing tactile cueing to hips for L weight shift. Standing balance without UE support while performing L lateral lean with LUE reaching to retrieve horseshoe; pt able to shift only slightly past midline to L side however improved with repetition. Stand pivot transfer to w/c with minA. Remained seated in w/c at end of session, quick release belt intact and RN alerted to pt position.      Therapy Documentation Precautions:  Precautions Precautions: Fall Precaution Comments: R side  inattention; impulsive Restrictions Weight Bearing Restrictions: No Pain: Pain Assessment Pain Assessment: No/denies pain   See Function Navigator for Current Functional Status.   Therapy/Group: Individual Therapy  Vista Lawman 03/07/2017, 3:37 PM

## 2017-03-07 NOTE — Progress Notes (Signed)
Speech Language Pathology Daily Session Note  Patient Details  Name: Kaitlin [name] MRN: [id number] Date of Birth: [birth year]  Today's Date: 03/07/2017 SLP Individual Time: 1062-6948 SLP Individual Time Calculation (min): 45 min  Short Term Goals: Week 2: SLP Short Term Goal 1 (Week 2): Pt will utilize word finding strategies to name common objects with 85% accuracy and Mod A cues.  SLP Short Term Goal 2 (Week 2): Pt will utilize external memory aids to recall daily information (to include orientation information) with Mod A cues.  SLP Short Term Goal 3 (Week 2): Pt will sustain attention to basic functional task for ~ 15 minutes with Mod A. SLP Short Term Goal 4 (Week 2): Pt will complete basic familiar task during ADLs with Mod A cues.  SLP Short Term Goal 5 (Week 2): Pt will consume trials of dysphagia 3 without overt s/s of oral phase dysphagia with Min A cues for use of compensatory swallow strategies.  SLP Short Term Goal 6 (Week 2): Pt will utilize speech intelligibility strategies to achieve ~ 75% intelligibility at the word level with Mod A cues.   Skilled Therapeutic Interventions: Skilled treatment session focused on dysphagia and cognition goals. SLP facilitated session by providing skilled observation of pt consuming trial of medicine whole (larger pills halved) in applesauce. Pt with oral difficulty managing pill with pocketing on right and chewing of some pills. Recommend continuing to crush medicine in applesauce. Education provided to husband and nursing on need for crushed pills. With Mod A cues, pt able to sustain attention to task for ~ 30 minutes. Given Max A faded to Mod A pt able to initiate and sustain color sorting task. With Max A multimodal cues, pt able to 3 sequence 3 basic cards and with Max A cues, pt able to imitate simple phrases per set. Pt returned to room, left upright in wheelchair with husband present. Continue per current plan of care      Function:  Eating Eating   Modified Consistency Diet: Yes Eating Assist Level: Set up assist for;Supervision or verbal cues   Eating Set Up Assist For: Opening containers       Cognition Comprehension Comprehension assist level: Understands basic 25 - 49% of the time/ requires cueing 50 - 75% of the time  Expression   Expression assist level: Expresses basic 25 - 49% of the time/requires cueing 50 - 75% of the time. Uses single words/gestures.  Social Interaction Social Interaction assist level: Interacts appropriately 25 - 49% of time - Needs frequent redirection.  Problem Solving Problem solving assist level: Solves basic 25 - 49% of the time - needs direction more than half the time to initiate, plan or complete simple activities;Solves basic less than 25% of the time - needs direction nearly all the time or does not effectively solve problems and may need a restraint for safety  Memory Memory assist level: Recognizes or recalls less than 25% of the time/requires cueing greater than 75% of the time    Pain Pain Assessment Pain Assessment: No/denies pain  Therapy/Group: Individual Therapy  Kaitlin [name] 03/07/2017, 1:54 PM

## 2017-03-08 ENCOUNTER — Inpatient Hospital Stay (HOSPITAL_COMMUNITY): Payer: Medicare Other | Admitting: Speech Pathology

## 2017-03-08 ENCOUNTER — Inpatient Hospital Stay (HOSPITAL_COMMUNITY): Payer: Self-pay | Admitting: Occupational Therapy

## 2017-03-08 ENCOUNTER — Inpatient Hospital Stay (HOSPITAL_COMMUNITY): Payer: Self-pay | Admitting: Physical Therapy

## 2017-03-08 MED ORDER — POTASSIUM CHLORIDE CRYS ER 10 MEQ PO TBCR
10.0000 meq | EXTENDED_RELEASE_TABLET | Freq: Two times a day (BID) | ORAL | Status: DC
  Administered 2017-03-08 – 2017-03-09 (×3): 10 meq via ORAL
  Filled 2017-03-08 (×4): qty 1

## 2017-03-08 NOTE — Progress Notes (Signed)
Occupational Therapy Session Note  Patient Details  Name: Kaitlin [name] MRN: [id number] Date of Birth: 20-Nov-1945  Today's Date: 03/08/2017 OT Individual Time: 1300-1400 OT Individual Time Calculation (min): 60 min    Short Term Goals: Week 2:  OT Short Term Goal 1 (Week 2): Pt will complete 1/3 toileting tasks with steadying assist OT Short Term Goal 2 (Week 2): Pt will thread R LE into pants with set-up assist and VCs OT Short Term Goal 3 (Week 2): Pt will use R UE at stabilizer level with min A during grooming tasks OT Short Term Goal 4 (Week 2): Pt will tolerate full dressing task with no more than 1 rest break in order to increase functional activity tolerance  Skilled Therapeutic Interventions/Progress Updates:    Pt seen for OT session focusing on ADL re-training and functional use of R UE. Pt asleep in w/c upon arrival with husband present. Pt required increased time and multimodal cuing for arousal, however, once awoken pt desiring to complete grooming tasks at sink. She was able to locate all needs items on sink ledge mod I and required min A for use of R UE to stabilize toothbrush while applying toothpaste with the L hand.  Participated in activities with emphasis of use on pt's non-dominant and affected R UE, including folding towels, flipping pages in a book, sorting rubber piece mats, stacking plastic cups, and using scissors to cut paper. Due to pt's cognitive deficits required multimodal cuing not to use L UE to assist or complete tasks. Pt most successful with paper cutting activity, using scissors in R hand and holding paper in L. Pt's husband educated regarding goal and purpose of B hand tasks and was the one who suggested cutting activity. Completed some activities standing at RW, pt able to maintain static standing balance while using B UEs with close supervision, requiring max VCs for hand placement during sit <> stand. Pt returned to room at end of session, min A stand pivot  transfer back to bed and pt left in supine with all needs in reach and bed alarm on.    Therapy Documentation Precautions:  Precautions Precautions: Fall Precaution Comments: R side inattention; impulsive Restrictions Weight Bearing Restrictions: No Pain:     No/ denies pain See Function Navigator for Current Functional Status.   Therapy/Group: Individual Therapy  [name], Kaitlin Sabo C 03/08/2017, 7:13 AM

## 2017-03-08 NOTE — Progress Notes (Signed)
Patient safety maintained.  Pt rested well during the night.  Denies needs/concerns at this time.

## 2017-03-08 NOTE — Progress Notes (Signed)
Physical Therapy Session Note  Patient Details  Name: Kaitlin [name] MRN: [id number] Date of Birth: [birth year]  Today's Date: 03/08/2017 PT Individual Time: 0900-1030 PT Individual Time Calculation (min): 90 min   Short Term Goals: Week 2:  PT Short Term Goal 1 (Week 2): Pt will ambulate 151ft with min assist and LRAD  PT Short Term Goal 2 (Week 2): Pt will propell WC 160ft with supervision assist  PT Short Term Goal 3 (Week 2): Pt will perform bed mobility with supervision assist  PT Short Term Goal 4 (Week 2): Pt will perform bed<>WC transfers with min assist consistently.    Skilled Therapeutic Interventions/Progress Updates: Pt presented with nsg at [facility] completing dressing. Performed ambulatory transfer via HHA to toilet. Transported to rehab gym via w/c for energy conservation. Performed stand pivot to mat minA. Performed sit to stands with BUE with emphasis on increased wt bearing on RLE. Placed 2in block under LLE for increased wt bearing and forced use of RLE. Pt able to achieve midline with simple single steps cues. Performed standing tolerance activities with card raching and matching for cognitive remediation. Pt initially unable to match cards when provided verbal instruction however with when PTA just handed pt card pt able to match 6/9 cards.Performed gait training with shopping cart 69ft x 4 with minA, use of AFO and trialed toe cap. Pt benefitted from tapping quad to promote quad activation in stance phase. Ambulated additional 42ft x 2 with RW requiring minA on straight paths and modA on turns due to increased R lean however no LOB. W/c mobilty with use if BLE and LUE, pt propelled with modA requiring multimodal cues for sustained task. Performed NuStep L 3 x 5 min for reciprocal movement and endurance. Pt returned to room and remained in w/c with QRB and husband present.      Therapy Documentation Precautions:  Precautions Precautions: Fall Precaution Comments: R side  inattention; impulsive Restrictions Weight Bearing Restrictions: No  See Function Navigator for Current Functional Status.   Therapy/Group: Individual Therapy  Cartez Mogle  Hildagarde Holleran, PTA  03/08/2017, 12:25 PM

## 2017-03-08 NOTE — Progress Notes (Signed)
Subjective/Complaints:  Patient participating well with PT, has difficulty with sequencing during gait. ROS: Limited due to cognition, but appears to deny CP, SOB, N/V/D.  Objective: Vital Signs: Blood pressure (!) 177/70, pulse (!) 50, temperature 98 F (36.7 C), temperature source Oral, resp. rate 16, weight 53.5 kg (118 lb), SpO2 99 %. No results found. Results for orders placed or performed during the hospital encounter of 02/22/17 (from the past 72 hour(s))  Basic metabolic panel     Status: Abnormal   Collection Time: 03/07/17  6:55 AM  Result Value Ref Range   Sodium 135 135 - 145 mmol/L   Potassium 5.1 3.5 - 5.1 mmol/L   Chloride 104 101 - 111 mmol/L   CO2 20 (L) 22 - 32 mmol/L   Glucose, Bld 90 65 - 99 mg/dL   BUN 18 6 - 20 mg/dL   Creatinine, Ser 0.74 0.44 - 1.00 mg/dL   Calcium 8.8 (L) 8.9 - 10.3 mg/dL   GFR calc non Af Amer >60 >60 mL/min   GFR calc Af Amer >60 >60 mL/min    Comment: (NOTE) The eGFR has been calculated using the CKD EPI equation. This calculation has not been validated in all clinical situations. eGFR's persistently <60 mL/min signify possible Chronic Kidney Disease.    Anion gap 11 5 - 15  CBC     Status: Abnormal   Collection Time: 03/07/17  6:55 AM  Result Value Ref Range   WBC 9.7 4.0 - 10.5 K/uL   RBC 3.16 (L) 3.87 - 5.11 MIL/uL   Hemoglobin 10.1 (L) 12.0 - 15.0 g/dL   HCT 29.7 (L) 36.0 - 46.0 %   MCV 94.0 78.0 - 100.0 fL   MCH 32.0 26.0 - 34.0 pg   MCHC 34.0 30.0 - 36.0 g/dL   RDW 13.9 11.5 - 15.5 %   Platelets 347 150 - 400 K/uL     HEENT: Normocephalic, atraumatic.  Neuro: Lethargic, Confused,  Motor: 3-/5 RUE (stable) 2-/5 RLE - Apraxia noted Left upper extremity. Left lower extremity 4 plus Dysarthria Musc/Skel:  No tenderness, no edema Gen NAD. Vital signs reivewed   Assessment/Plan: 1. Functional deficits secondary to Left MCA infarct which require 3+ hours per day of interdisciplinary therapy in a comprehensive  inpatient rehab setting. Physiatrist is providing close team supervision and 24 hour management of active medical problems listed below. Physiatrist and rehab team continue to assess barriers to discharge/monitor patient progress toward functional and medical goals. FIM: Function - Bathing Position: Shower Body parts bathed by patient: Chest, Abdomen, Left upper leg, Front perineal area, Right arm, Left arm, Right upper leg Body parts bathed by helper: Buttocks, Right lower leg, Left lower leg, Back Assist Level: Touching or steadying assistance(Pt > 75%)  Function- Upper Body Dressing/Undressing What is the patient wearing?: Bra, Pull over shirt/dress Bra - Perfomed by patient: Thread/unthread right bra strap, Thread/unthread left bra strap Bra - Perfomed by helper: Hook/unhook bra (pull down sports bra) Pull over shirt/dress - Perfomed by patient: Put head through opening, Thread/unthread left sleeve Pull over shirt/dress - Perfomed by helper: Thread/unthread right sleeve, Pull shirt over trunk Assist Level:  (ModA) Function - Lower Body Dressing/Undressing What is the patient wearing?: Pants, Socks Position: Wheelchair/chair at sink Pants- Performed by patient: Thread/unthread left pants leg Pants- Performed by helper: Thread/unthread right pants leg, Thread/unthread left pants leg, Pull pants up/down Non-skid slipper socks- Performed by patient: Don/doff right sock, Don/doff left sock Non-skid slipper socks- Performed by helper: Don/doff  right sock, Don/doff left sock Socks - Performed by helper: Don/doff right sock, Don/doff left sock Shoes - Performed by helper: Don/doff right shoe, Don/doff left shoe Assist for footwear: Maximal assist Assist for lower body dressing:  (MaxA)  Function - Toileting Toileting activity did not occur: No continent bowel/bladder event Toileting steps completed by patient: Performs perineal hygiene Toileting steps completed by helper: Adjust clothing  prior to toileting, Adjust clothing after toileting Toileting Assistive Devices: Grab bar or rail Assist level:  ()  Function - Air cabin crew transfer assistive device: Grab bar, Elevated toilet seat/BSC over toilet Assist level to toilet: Touching or steadying assistance (Pt > 75%) Assist level from toilet: Touching or steadying assistance (Pt > 75%) Assist level to bedside commode (at bedside): 2 helpers (1 helper to assist with hygiene or clothing manipulation) Assist level from bedside commode (at bedside): 2 helpers  Function - Chair/bed transfer Chair/bed transfer method: Stand pivot Chair/bed transfer assist level: Touching or steadying assistance (Pt > 75%) Chair/bed transfer assistive device: Armrests Chair/bed transfer details: Manual facilitation for weight shifting, Tactile cues for placement, Tactile cues for initiation, Verbal cues for sequencing, Verbal cues for precautions/safety  Function - Locomotion: Wheelchair Will patient use wheelchair at discharge?: Yes Type: Manual Max wheelchair distance: 15 Assist Level: Moderate assistance (Pt 50 - 74%) Assist Level: Moderate assistance (Pt 50 - 74%) Wheel 150 feet activity did not occur: Safety/medical concerns Function - Locomotion: Ambulation Assistive device: Walker-rolling, Other (comment) Max distance: 60 Assist level: Moderate assist (Pt 50 - 74%) Assist level: Moderate assist (Pt 50 - 74%) Walk 50 feet with 2 turns activity did not occur: Safety/medical concerns Assist level: Moderate assist (Pt 50 - 74%) Walk 150 feet activity did not occur: Safety/medical concerns Walk 10 feet on uneven surfaces activity did not occur: Safety/medical concerns  Function - Comprehension Comprehension: Auditory Comprehension assist level: Understands basic 25 - 49% of the time/ requires cueing 50 - 75% of the time  Function - Expression Expression: Verbal Expression assist level: Expresses basic 25 - 49% of the  time/requires cueing 50 - 75% of the time. Uses single words/gestures.  Function - Social Interaction Social Interaction assist level: Interacts appropriately 25 - 49% of time - Needs frequent redirection.  Function - Problem Solving Problem solving assist level: Solves basic 25 - 49% of the time - needs direction more than half the time to initiate, plan or complete simple activities, Solves basic less than 25% of the time - needs direction nearly all the time or does not effectively solve problems and may need a restraint for safety  Function - Memory Memory assist level: Recognizes or recalls less than 25% of the time/requires cueing greater than 75% of the time Patient normally able to recall (first 3 days only): None of the above  Medical Problem List and Plan: 1.  Right hemiparesis and language deficits secondary to left MCA infarct  Cont CIR, PT, OT, SLP- team conference in a.m., progressing towards goals  2.  DVT Prophylaxis/Anticoagulation: Pharmaceutical: Xarelto and Plavix, high risk of bleeding, monitor 3. Pain Management: tylenol prn 4. Mood: LCSW to follow for evaluation and support. Sleep/wake cycle  5. Neuropsych: This patient is not capable of making decisions on her own behalf. 6. Skin/Wound Care: Routine pressure relief measures.  7. Fluids/Electrolytes/Nutrition: Monitor I/O. Normal BUN and creatinine on 02/23/2017, continue to monitor intake, variable meal intake 45-100 % , oral intake, 720 mL yesterday- IVF at night, able to drink but lacks awareness  of thirst, no initiation 8. A fib: Monitor HR bid. Continue amiodarone and metoprolol for HR control.    10 Leukocytosis: Resolved decreased to 9.7K on 8/6 afebrile   Ucx No growth  11. Hypokalemia: Improving with supplement. Will continue to supplement for now.   K+ improved 5.1 on 8/6, reduce potassium to 10 mEq twice a day  Cont to monitor 12. ABLA: . Monitor for signs of bleeding. Hgb  reduced at 11.7 -> 9.3-> 10.  7-> 10.1 8/6 13. Multiple Sclerosis: No medications? 14. H/o Depression: On Effexor and prozac.  67. H/o CAD s/p PTCA: On Lipitor  16. Lethargy: Overall improved, now mainly due to effects of CVA 17. Labile blood pressure no further med adjustments for now 8/7   Vitals:   03/07/17 1425 03/08/17 0540  BP: (!) 126/56 (!) 177/70  Pulse: (!) 50 (!) 50  Resp: 16 16  Temp: 97.9 F (36.6 C) 98 F (36.7 C)   18. Hypoalbuminemia  Supplement initiated 7/28    LOS (Days) 14 A FACE TO FACE EVALUATION WAS PERFORMED  Mitchel Delduca E 03/08/2017, 10:47 AM

## 2017-03-08 NOTE — Progress Notes (Signed)
Speech Language Pathology Daily Session Note  Patient Details  Name: Kaitlin [name] MRN: [id number] Date of Birth: 04-11-1946  Today's Date: 03/08/2017 SLP Group Time: 1130-1230 SLP Group Time Calculation (min): 60 min  Short Term Goals: Week 2: SLP Short Term Goal 1 (Week 2): Pt will utilize word finding strategies to name common objects with 85% accuracy and Mod A cues.  SLP Short Term Goal 2 (Week 2): Pt will utilize external memory aids to recall daily information (to include orientation information) with Mod A cues.  SLP Short Term Goal 3 (Week 2): Pt will sustain attention to basic functional task for ~ 15 minutes with Mod A. SLP Short Term Goal 4 (Week 2): Pt will complete basic familiar task during ADLs with Mod A cues.  SLP Short Term Goal 5 (Week 2): Pt will consume trials of dysphagia 3 without overt s/s of oral phase dysphagia with Min A cues for use of compensatory swallow strategies.  SLP Short Term Goal 6 (Week 2): Pt will utilize speech intelligibility strategies to achieve ~ 75% intelligibility at the word level with Mod A cues.   Skilled Therapeutic Interventions: Skilled treatment session focused on cognition goals. SLP facilitated session by providing Max A multimodal cues for task initiation and sustained attention to basic functional task for ~ 15 minutes with Max A cues. Of note, pt consumed regular diet without overt s/s of aspiration. Pt was returned to room, left upright in wheelchair and husband present. Continue per current plan of care.      Function:  Eating Eating   Modified Consistency Diet: No Eating Assist Level: Set up assist for;Supervision or verbal cues   Eating Set Up Assist For: Opening containers       Cognition Comprehension Comprehension assist level: Understands basic 25 - 49% of the time/ requires cueing 50 - 75% of the time  Expression   Expression assist level: Expresses basic 25 - 49% of the time/requires cueing 50 - 75% of the time. Uses  single words/gestures.  Social Interaction Social Interaction assist level: Interacts appropriately 25 - 49% of time - Needs frequent redirection.;Interacts appropriately less than 25% of the time. May be withdrawn or combative.  Problem Solving Problem solving assist level: Solves basic less than 25% of the time - needs direction nearly all the time or does not effectively solve problems and may need a restraint for safety  Memory Memory assist level: Recognizes or recalls less than 25% of the time/requires cueing greater than 75% of the time    Pain    Therapy/Group: Individual Therapy  [name] 03/08/2017, 4:53 PM

## 2017-03-09 ENCOUNTER — Encounter (HOSPITAL_COMMUNITY): Payer: Self-pay | Admitting: Speech Pathology

## 2017-03-09 ENCOUNTER — Inpatient Hospital Stay (HOSPITAL_COMMUNITY): Payer: Self-pay | Admitting: Occupational Therapy

## 2017-03-09 ENCOUNTER — Inpatient Hospital Stay (HOSPITAL_COMMUNITY): Payer: Self-pay | Admitting: Physical Therapy

## 2017-03-09 NOTE — Patient Care Conference (Signed)
Inpatient RehabilitationTeam Conference and Plan of Care Update Date: 03/09/2017   Time: 10:45 AM    Patient Name: Kaitlin [name]      Medical Record Number: [id number]  Date of Birth: 05-Oct-1945 Sex: Female         Room/Bed: 4W14C/4W14C-01 Payor Info: Payor: Advertising copywriter MEDICARE / Plan: [facility] MEDICARE / Product Type: *No Product type* /    Admitting Diagnosis: L CVA  Admit Date/Time:  02/22/2017  5:03 PM Admission Comments: No comment available   Primary Diagnosis:  <principal problem not specified> Principal Problem: <principal problem not specified>  Patient Active Problem List   Diagnosis Date Noted  . Right hemiparesis (HCC)   . Aspiration pneumonia of right lung (HCC)   . Leukocytosis   . Hypokalemia   . Labile blood pressure   . Abnormality of gait as late effect of stroke 02/23/2017  . Dysarthria as late effect of stroke 02/23/2017  . Dysphagia as late effect of stroke 02/23/2017  . Acute ischemic left middle cerebral artery (MCA) stroke (HCC) 02/22/2017  . Paroxysmal A-fib (HCC) 02/17/2017  . Atrial fibrillation with RVR (HCC) 02/17/2017  . HLD (hyperlipidemia) 02/17/2017  . Acute hypoxemic respiratory failure (HCC)   . Stroke (cerebrum) (HCC) - Acute MCA infarct due to left CCA/ICA and MCA occlusion, s/p mechanical thrombectomy and left ICA stenting in the setting of newly diagnosed afib 02/14/2017    Expected Discharge Date: Expected Discharge Date: 03/19/17  Team Members Present: Physician leading conference: Dr. Claudette Laws Social Worker Present: Staci Acosta, LCSW Nurse Present: Other (comment) Luane School, RN) PT Present: Harless Litten, PTA;Midge Minium, PT OT Present: Johnsie Cancel, OT SLP Present: Reuel Derby, SLP PPS Coordinator present : Tora Duck, RN, CRRN     Current Status/Progress Goal Weekly Team Focus  Medical   right hemiparesis, severe cognitive deficits, right neglect, aphasia  maintain adequate nutrition and hydration, improve  endurance, maintaining medical stability  work on toileting program   Bowel/Bladder   Patient continent at this time.  LBM 03/08/17.    Patient to remain continent of B/B while on IPR  Continue to monitor B/B needs and toilet as needed.   Swallow/Nutrition/ Hydration   Regular with thin liquids  Min Assist  safety with swallowing   ADL's   Mod A toileting; min A UB bathing/ dressing; mod A LB bathing; dressing; min-mod A functional transfers  Min A overall  ADL re-training, r neuro re-ed, standing balance, family education, d/c planning   Mobility   MinA transfers, min/modA gait with RW (minA with trial of AFO with toe cap), R inattention  Min-supervision overall  R NMR, standing balance, gait,    Communication   Mod to max assist for functional communication  Min A  naming objects, conveying needs/wants to caregivers, increasing vocal intensity   Safety/Cognition/ Behavioral Observations  Max Assist  Mod A - downgraded   orientation, carryover of simple daily task   Pain   Patient denies pain at this time  Patient to have pain <2 while on IPR  Continue to monitor pain level and medicate as needed.   Skin   Bruising noted to BUE.  Abrasion noted to L shin w/ dressing c/d/i.  No skin breakdown noted at this time.  Patient to maintain skin integrity while on IPR  Continue to monitor skin integrity.  Assess q shift and PRN    Rehab Goals Patient on target to meet rehab goals: Yes Rehab Goals Revised: speech goals will need to  be downgraded due to cognition *See Care Plan and progress notes for long and short-term goals.     Barriers to Discharge  Current Status/Progress Possible Resolutions Date Resolved   Physician    Medical stability;Incontinence  cognition not progressing  progressing slowly towards goals  continue CIR, husband may need a hired caregiver      Nursing                  PT                    OT                  SLP                SW                 Discharge Planning/Teaching Needs:  Pt to go home with her husband to provide care.  Husband has begun family education.   Team Discussion:  Pt with little cognitive improvement.  Pt is eating better and husband is engaged in her care.  RN stated that pt's continence is improving and timed toileting has been initiated.  OT started family education with stand pivot tx to toilet.  Pt is attending to left side better and is able to dress herself more as a result.  ST may downgrade goals for cognition and does not feel pt is initiating much.  PT feels pt's sitting balance is improving and pt is min to mod A with txs.  Her turns with ambulation are better and PT may trial an AFO.  Revisions to Treatment Plan:  none    Continued Need for Acute Rehabilitation Level of Care: The patient requires daily medical management by a physician with specialized training in physical medicine and rehabilitation for the following conditions: Daily direction of a multidisciplinary physical rehabilitation program to ensure safe treatment while eliciting the highest outcome that is of practical value to the patient.: Yes Daily medical management of patient stability for increased activity during participation in an intensive rehabilitation regime.: Yes Daily analysis of laboratory values and/or radiology reports with any subsequent need for medication adjustment of medical intervention for : Neurological problems;Nutritional problems  Lord Lancour, [place]ck 03/09/2017, 2:29 PM

## 2017-03-09 NOTE — Progress Notes (Signed)
Occupational Therapy Weekly Progress Note  Patient Details  Name: Kaitlin [name] MRN: [id number] Date of Birth: [birth year]  Beginning of progress report period: March 02, 2017 End of progress report period: March 09, 2017  Today's Date: 03/09/2017 OT Individual Time: 2440-1027 and 1415-1500 OT Individual Time Calculation (min): 60 min and 45 min   Patient has met 4 of 4 short term goals.  Pt making good progress towards OT goals. She is able to transfer with min A and VCs for sequencing. She has begun to initiate more use of R UE into functional tasks, however, cont to demonstrate severe aptraxia and ataxia with R UE. Pt's husband has been present throughout sessions and have begun discussion regarding d/c planning including providing home measurements and photos.   Patient continues to demonstrate the following deficits: abnormal posture, apraxia, ataxia, cognitive deficits, hemiplegia affecting non-dominant side and muscle weakness (generalized) and therefore will continue to benefit from skilled OT intervention to enhance overall performance with BADL and Reduce care partner burden.  Patient progressing toward long term goals..  Continue plan of care.  OT Short Term Goals Week 2:  OT Short Term Goal 1 (Week 2): Pt will complete 1/3 toileting tasks with steadying assist OT Short Term Goal 1 - Progress (Week 2): Met OT Short Term Goal 2 (Week 2): Pt will thread R LE into pants with set-up assist and VCs OT Short Term Goal 2 - Progress (Week 2): Met OT Short Term Goal 3 (Week 2): Pt will use R UE at stabilizer level with min A during grooming tasks OT Short Term Goal 3 - Progress (Week 2): Met OT Short Term Goal 4 (Week 2): Pt will tolerate full dressing task with no more than 1 rest break in order to increase functional activity tolerance OT Short Term Goal 4 - Progress (Week 2): Met Week 3:  OT Short Term Goal 1 (Week 3): STG=LTG due to LOS  Skilled Therapeutic Interventions/Progress  Updates:    Session One: PT seen for OT session focusing on ADL re-training. Pt sitting up in bed upon arrival with RN adminstering medications and meal tray in front of pt. She was able to correctly name 2/3 food items, an able to read iems off meal ticket with min A.  She transferred to EOB with min A and multimodal cuing for sequencing. Min A stand pivot transfer completed into w/c. Pt opting to shower during PM session and desiring to change clothes this morning. Pt able to correctly name all clothing items independently and was able to correctly orient clothing when presented with folding clothing items. Pt attended to R side when donning shirt and pants mod I- great improvement from previous tx sessions. Required mod steadying assist while attempting to pull up pants, requiring assist to pull pants over R hip. Pt able to use L UE at gross assist stabilizing level during grooming tasks with cuing for problem solving.  In bathroom, completed min A stand pivot transfer to toilet with VCs for hand placement on grab bar. Pt able to manage pants down with mod steadying assist, assist for hygiene required following BM. Pt completed hand hygiene at sink, initiating use of R UE independently to wash hands.  Pt left seated in w/c at end of session, QRB donned and husband present. Pt's husband provided pictures of measurements of home bathroom set-up. Discussed DME, AE, and d/c planning.   Session Two: Pt seen for OT ADL bathing/dressing session. Pt sitting up in w/c upon arrival,  agreeable to tx session. She required encouragement for showering activity due to disorientation of time and situation, able to be minimally re-oriented and agreeable to tx session. She ambulated into bathroom with RW and min A with AFO donned. Completed stand pivot out of shower to w/c due to no brace.  She bathed seated on BSC, today demonstrating decreased dynamic sitting balance, having several LOB episodes requiring use of hand  rails on BSC to reobtain midline following VCs for awareness of LOB.  She dressed seated in w/c, requiring min A for orientation of shirt and to thread completely over R arm, however, did independently initiate threading of both R UE and LE. Pt returned to supine at end of session, left with all needs in reach and bed alarm activated.   Therapy Documentation Precautions:  Precautions Precautions: Fall Precaution Comments: R side inattention; impulsive Restrictions Weight Bearing Restrictions: No Pain:   No/ denies pain  See Function Navigator for Current Functional Status.   Therapy/Group: Individual Therapy  Lewis, Lynzie Cliburn C 03/09/2017, 7:14 AM

## 2017-03-09 NOTE — Progress Notes (Signed)
Social Work Patient ID: Kaitlin [name], female   DOB: 1946-06-22, 71 y.o.   MRN: [id number]   CSW met with pt and her husband to update them on team conference discussion.  Explained that targeted d/c date remains 03-19-17.  Husband has begun family training, but he knows he has more to learn.  He's hired a Secretary/administrator to assist them at home so that he can spend more time with the pt.  He is open to CSW giving him a private duty list and hiring help for him to have some free hours to run errands.  Pt's husband still intends to care for pt at home.  CSW to discuss if he feels ST SNF would be helpful, but at this time he is continuing to indicate he remains committed to taking her home after CIR.  CSW to give him private duty list tomorrow and will remain available to assist as needed.

## 2017-03-09 NOTE — Progress Notes (Signed)
Patient safety maintained.  Patient resting quietly in bed in no apparent distress.

## 2017-03-09 NOTE — Progress Notes (Signed)
Physical Therapy Session Note  Patient Details  Name: Kaitlin [name] MRN: [id number] Date of Birth: April 17, 1946  Today's Date: 03/09/2017 PT Individual Time: 1300-1400 PT Individual Time Calculation (min): 60 min   Short Term Goals: Week 2:  PT Short Term Goal 1 (Week 2): Pt will ambulate 168ft with min assist and LRAD  PT Short Term Goal 2 (Week 2): Pt will propell WC 189ft with supervision assist  PT Short Term Goal 3 (Week 2): Pt will perform bed mobility with supervision assist  PT Short Term Goal 4 (Week 2): Pt will perform bed<>WC transfers with min assist consistently.    Skilled Therapeutic Interventions/Progress Updates: Pt presented in w/c with husband present agreeable to therapy. Transported to rehab gym for energy conservation. Initiated stair training ascend/descend x 4 6in steps with modA ascending and maxA descending due to decreased motor planning and difficulty with LLE eccentric control. Practiced 4in platform with RW x6 trials. Pt able to perform initially with modA  while following one step commands then was able to progress to minA decreased verbal cues. Performed gait training with AFO and toe cap, pt ambulated 29ft and 54ft with minA however demonstrating increasing RLE shuffling with fatigue. Pt ambulated to toilet for timed toileting and provided continued training to husband to perform toilet transfers. Pt returned to w/c and remained in w/c with QRB and half lap tray in place with husband present.      Therapy Documentation Precautions:  Precautions Precautions: Fall Precaution Comments: R side inattention; impulsive Restrictions Weight Bearing Restrictions: No General:     See Function Navigator for Current Functional Status.   Therapy/Group: Individual Therapy  Kaitlin [name]  03/09/2017, 10:32 PM

## 2017-03-09 NOTE — Progress Notes (Signed)
Subjective/Complaints:  Remains disoriented able to read magazine cover Speech aphasia, dysarthric ROS: Limited due to cognition, but appears to deny CP, SOB, N/V/D.  Objective: Vital Signs: Blood pressure (!) 145/46, pulse (!) 51, temperature 98.2 F (36.8 C), temperature source Oral, resp. rate 18, weight 53.5 kg (118 lb), SpO2 100 %. No results found. Results for orders placed or performed during the hospital encounter of 02/22/17 (from the past 72 hour(s))  Basic metabolic panel     Status: Abnormal   Collection Time: 03/07/17  6:55 AM  Result Value Ref Range   Sodium 135 135 - 145 mmol/L   Potassium 5.1 3.5 - 5.1 mmol/L   Chloride 104 101 - 111 mmol/L   CO2 20 (L) 22 - 32 mmol/L   Glucose, Bld 90 65 - 99 mg/dL   BUN 18 6 - 20 mg/dL   Creatinine, Ser 0.74 0.44 - 1.00 mg/dL   Calcium 8.8 (L) 8.9 - 10.3 mg/dL   GFR calc non Af Amer >60 >60 mL/min   GFR calc Af Amer >60 >60 mL/min    Comment: (NOTE) The eGFR has been calculated using the CKD EPI equation. This calculation has not been validated in all clinical situations. eGFR's persistently <60 mL/min signify possible Chronic Kidney Disease.    Anion gap 11 5 - 15  CBC     Status: Abnormal   Collection Time: 03/07/17  6:55 AM  Result Value Ref Range   WBC 9.7 4.0 - 10.5 K/uL   RBC 3.16 (L) 3.87 - 5.11 MIL/uL   Hemoglobin 10.1 (L) 12.0 - 15.0 g/dL   HCT 29.7 (L) 36.0 - 46.0 %   MCV 94.0 78.0 - 100.0 fL   MCH 32.0 26.0 - 34.0 pg   MCHC 34.0 30.0 - 36.0 g/dL   RDW 13.9 11.5 - 15.5 %   Platelets 347 150 - 400 K/uL     HEENT: Normocephalic, atraumatic.  Neuro: Lethargic, Confused,  Motor: 3-/5 RUE (stable) 2-/5 RLE - Apraxia noted Left upper extremity. Left lower extremity 4 plus Dysarthria Musc/Skel:  No tenderness, no edema Gen NAD. Vital signs reivewed   Assessment/Plan: 1. Functional deficits secondary to Left MCA infarct which require 3+ hours per day of interdisciplinary therapy in a comprehensive  inpatient rehab setting. Physiatrist is providing close team supervision and 24 hour management of active medical problems listed below. Physiatrist and rehab team continue to assess barriers to discharge/monitor patient progress toward functional and medical goals. FIM: Function - Bathing Position: Shower Body parts bathed by patient: Chest, Abdomen, Left upper leg, Front perineal area, Right arm, Left arm, Right upper leg Body parts bathed by helper: Buttocks, Right lower leg, Left lower leg, Back Assist Level: Touching or steadying assistance(Pt > 75%)  Function- Upper Body Dressing/Undressing What is the patient wearing?: Bra, Pull over shirt/dress Bra - Perfomed by patient: Thread/unthread right bra strap, Thread/unthread left bra strap Bra - Perfomed by helper: Hook/unhook bra (pull down sports bra) Pull over shirt/dress - Perfomed by patient: Thread/unthread right sleeve, Thread/unthread left sleeve, Put head through opening, Pull shirt over trunk Pull over shirt/dress - Perfomed by helper: Thread/unthread right sleeve, Pull shirt over trunk Assist Level: Touching or steadying assistance(Pt > 75%), 2 helpers Function - Lower Body Dressing/Undressing What is the patient wearing?: Pants, Socks, Shoes Position: Wheelchair/chair at [employer]- Performed by patient: Thread/unthread right pants leg, Thread/unthread left pants leg Pants- Performed by helper: Pull pants up/down Non-skid slipper socks- Performed by patient: Don/doff right sock, Don/doff  left sock Non-skid slipper socks- Performed by helper: Don/doff right sock, Don/doff left sock Socks - Performed by patient: Don/doff right sock, Don/doff left sock Socks - Performed by helper: Don/doff right sock, Don/doff left sock Shoes - Performed by patient: Don/doff left shoe Shoes - Performed by helper: Don/doff right shoe, Fasten right, Fasten left Assist for footwear: Partial/moderate assist Assist for lower body dressing: 2  Helpers  Function - Toileting Toileting activity did not occur: No continent bowel/bladder event Toileting steps completed by patient: Performs perineal hygiene Toileting steps completed by helper: Adjust clothing prior to toileting, Performs perineal hygiene, Adjust clothing after toileting Toileting Assistive Devices: Grab bar or rail Assist level: More than reasonable time, Two helpers  Function - Air cabin crew transfer assistive device: Bedside commode Assist level to toilet: Touching or steadying assistance (Pt > 75%) Assist level from toilet: Touching or steadying assistance (Pt > 75%) Assist level to bedside commode (at bedside): 2 helpers Assist level from bedside commode (at bedside): 2 helpers  Function - Chair/bed transfer Chair/bed transfer method: Stand pivot Chair/bed transfer assist level: Maximal assist (Pt 25 - 49%/lift and lower) Chair/bed transfer assistive device: Armrests, Bedrails Chair/bed transfer details: Manual facilitation for weight shifting, Tactile cues for placement, Tactile cues for initiation, Verbal cues for sequencing, Verbal cues for precautions/safety  Function - Locomotion: Wheelchair Will patient use wheelchair at discharge?: Yes Type: Motorized Max wheelchair distance: 45f Assist Level: Moderate assistance (Pt 50 - 74%) Assist Level: Moderate assistance (Pt 50 - 74%) Wheel 150 feet activity did not occur: Safety/medical concerns Function - Locomotion: Ambulation Assistive device: Walker-rolling, Other (comment) (Shopping cart) Max distance: 970fAssist level: Moderate assist (Pt 50 - 74%) Assist level: Moderate assist (Pt 50 - 74%) Walk 50 feet with 2 turns activity did not occur: Safety/medical concerns Assist level: Moderate assist (Pt 50 - 74%) Walk 150 feet activity did not occur: Safety/medical concerns Walk 10 feet on uneven surfaces activity did not occur: Safety/medical concerns  Function - Comprehension Comprehension:  Auditory Comprehension assist level: Understands basic 25 - 49% of the time/ requires cueing 50 - 75% of the time  Function - Expression Expression: Verbal Expression assist level: Expresses basic 25 - 49% of the time/requires cueing 50 - 75% of the time. Uses single words/gestures.  Function - Social Interaction Social Interaction assist level: Interacts appropriately 25 - 49% of time - Needs frequent redirection., Interacts appropriately less than 25% of the time. May be withdrawn or combative.  Function - Problem Solving Problem solving assist level: Solves basic less than 25% of the time - needs direction nearly all the time or does not effectively solve problems and may need a restraint for safety  Function - Memory Memory assist level: Recognizes or recalls less than 25% of the time/requires cueing greater than 75% of the time Patient normally able to recall (first 3 days only): None of the above  Medical Problem List and Plan: 1.  Right hemiparesis and language deficits secondary to left MCA infarct  Cont CIR, PT, OT, SLP- Team conference today please see physician documentation under team conference tab, met with team face-to-face to discuss problems,progress, and goals. Formulized individual treatment plan based on medical history, underlying problem and comorbidities.  2.  DVT Prophylaxis/Anticoagulation: Pharmaceutical: Xarelto and Plavix, high risk of bleeding, monitor 3. Pain Management: tylenol prn 4. Mood: LCSW to follow for evaluation and support. Sleep/wake cycle  5. Neuropsych: This patient is not capable of making decisions on her own behalf. 6.  Skin/Wound Care: Routine pressure relief measures.  7. Fluids/Electrolytes/Nutrition: Monitor I/O. Normal BUN and creatinine on 02/23/2017, continue to monitor intake, variable meal intake 45-100 % , oral intake, 720 mL yesterday- IVF at night, able to drink but lacks awareness of thirst, no initiation 8. A fib: Monitor HR bid.  Continue amiodarone and metoprolol for HR control.      11. Hypokalemia: Improving with supplement. Will continue to supplement for now.   K+ improved 5.1 on 8/6, reduce potassium to 10 mEq twice a day  Recheck BMET in am 12. ABLA: . Monitor for signs of bleeding. Hgb  reduced at 11.7 -> 9.3-> 10. 7-> 10.1 8/6 13. Multiple Sclerosis: No medications? 14. H/o Depression: On Effexor and prozac.  26. H/o CAD s/p PTCA: On Lipitor  16. Lethargy: Overall improved, now mainly due to effects of CVA 17. Labile blood pressure no further med adjustments for now 8/8   Vitals:   03/08/17 1509 03/09/17 0516  BP: (!) 158/43 (!) 145/46  Pulse: (!) 50 (!) 51  Resp: 17 18  Temp: 98.1 F (36.7 C) 98.2 F (36.8 C)   18. Hypoalbuminemia  Supplement initiated 7/28    LOS (Days) 15 A FACE TO FACE EVALUATION WAS PERFORMED  Kaitlin [name] E 03/09/2017, 9:57 AM

## 2017-03-10 ENCOUNTER — Inpatient Hospital Stay (HOSPITAL_COMMUNITY): Payer: Self-pay | Admitting: Occupational Therapy

## 2017-03-10 ENCOUNTER — Inpatient Hospital Stay (HOSPITAL_COMMUNITY): Payer: Medicare Other | Admitting: Speech Pathology

## 2017-03-10 MED ORDER — POTASSIUM CHLORIDE 20 MEQ/15ML (10%) PO SOLN
10.0000 meq | Freq: Two times a day (BID) | ORAL | Status: DC
  Administered 2017-03-10 (×2): 10 meq via ORAL
  Filled 2017-03-10 (×2): qty 15

## 2017-03-10 MED ORDER — CHLORHEXIDINE GLUCONATE 0.12 % MT SOLN
OROMUCOSAL | Status: AC
  Filled 2017-03-10: qty 15

## 2017-03-10 MED ORDER — CHLORHEXIDINE GLUCONATE 0.12 % MT SOLN
15.0000 mL | Freq: Two times a day (BID) | OROMUCOSAL | Status: DC
  Administered 2017-03-10 – 2017-03-19 (×18): 15 mL via OROMUCOSAL
  Filled 2017-03-10 (×18): qty 15

## 2017-03-10 NOTE — Progress Notes (Signed)
Occupational Therapy Session Note  Patient Details  Name: Kaitlin [name] MRN: [id number] Date of Birth: Dec 23, 1945  Today's Date: 03/10/2017 OT Individual Time: 2103-1281 OT Individual Time Calculation (min): 75 min    Short Term Goals: Week 3:  OT Short Term Goal 1 (Week 3): STG=LTG due to LOS  Skilled Therapeutic Interventions/Progress Updates:    Session One: Pt seen for OT session focusing on ADL re-training. Pt in supine upon arrival with husband present, set-up with meal tray. Pt agreeable to tx session and to cont eating breakfast seated EOB. Transferred with tactile cuing for sequencing and hospital bed functions. She ate several more bites seated EOB and with use of meal ticket, pt able to identify items on food tray. She dressed seated EOB, able to correctly orient clothing when handed to her folded. With VCs, pt able to follow hemi dressing technique with improved attention to R side during functional tasks. She stood with RW and min steadying assist to have pants pulled up.  She voiced urgent need for toileting and required mod A for ambulation to bathroom due to urgency with poor safety awareness. Unable to make it to toilet in time, therefore pants and brief changed seated on toilet.  She completed grooming tasks from w/c level at sink, requiring min-mod cuing for locating needed items. Pt left seated in w/c at end of session, QRB donned and husband present.  Throughout session, discussed with pt's husband DME recommendations, incorporating therapy into every day tasks, importance of participation with ADLs, reducing caregiver burden and d/c planning.   Therapy Documentation Precautions:  Precautions Precautions: Fall Precaution Comments: R side inattention; impulsive Restrictions Weight Bearing Restrictions: No Pain:   No/ denies pain  See Function Navigator for Current Functional Status.   Therapy/Group: Individual Therapy  Lewis, Chloe Flis C 03/10/2017, 7:06 AM

## 2017-03-10 NOTE — Progress Notes (Signed)
Speech Language Pathology Weekly Progress and Session Note  Patient Details  Name: Kaitlin [name] MRN: [id number] Date of Birth: [birth year]  Beginning of progress report period: March 03, 2017 End of progress report period: March 10, 2017  Today's Date: 03/10/2017 SLP Individual Time: 1400-1430 SLP Individual Time Calculation (min): 30 min  Short Term Goals: Week 2: SLP Short Term Goal 1 (Week 2): Pt will utilize word finding strategies to name common objects with 85% accuracy and Mod A cues.  SLP Short Term Goal 1 - Progress (Week 2): Not met SLP Short Term Goal 2 (Week 2): Pt will utilize external memory aids to recall daily information (to include orientation information) with Mod A cues.  SLP Short Term Goal 2 - Progress (Week 2): Not met SLP Short Term Goal 3 (Week 2): Pt will sustain attention to basic functional task for ~ 15 minutes with Mod A. SLP Short Term Goal 3 - Progress (Week 2): Met SLP Short Term Goal 4 (Week 2): Pt will complete basic familiar task during ADLs with Mod A cues.  SLP Short Term Goal 4 - Progress (Week 2): Not met SLP Short Term Goal 5 (Week 2): Pt will consume trials of dysphagia 3 without overt s/s of oral phase dysphagia with Min A cues for use of compensatory swallow strategies.  SLP Short Term Goal 5 - Progress (Week 2): Met SLP Short Term Goal 6 (Week 2): Pt will utilize speech intelligibility strategies to achieve ~ 75% intelligibility at the word level with Mod A cues.  SLP Short Term Goal 6 - Progress (Week 2): Not met    New Short Term Goals: Week 3: SLP Short Term Goal 1 (Week 3): Pt will utilize word finding strategies to name common objects with 50% accuracy and Mod A cues.  SLP Short Term Goal 2 (Week 3): Pt will sustain attention to basic functional task for ~ 30 minutes with Mod A. SLP Short Term Goal 3 (Week 3): Pt will complete basic familiar task during ADLs with Mod A cues.  SLP Short Term Goal 4 (Week 3): Pt will utilize speech  intelligibility strategies to achieve ~ 75% intelligibility at the word level with Mod A cues.   Weekly Progress Updates: Pt continues with slow progress. As a result, she has not met many goals. She has seen growth in ability to maintain arousal and attention to tasks but remains unable to solve basic problems and express her wants and needs. As a result, she continues to require skilled ST to increase functional independence and reduce caregiver burden. Anticipate that pt will require Min A to Mod A for cognition at discharge with follow up [place]. Education has continually been provided to pt's husband who has been present for most sessions.      Intensity: Minumum of 1-2 x/day, 30 to 90 minutes Frequency: 3 to 5 out of 7 days Duration/Length of Stay: 8/18 Treatment/Interventions: Cognitive remediation/compensation;Cueing hierarchy;Functional tasks;Internal/external aids;Speech/Language facilitation;Therapeutic Activities;Patient/family education   Daily Session  Skilled Therapeutic Interventions: Skilled treatment session focused on cognition goals. SLP facilitated session by providing Mod A cues to complete simple medication management tasks. Pt required Min A to supervision cues to sustained attention to task for 30 minutes. With Max multimodal cues, pt unable to name objects. Pt was returned to room, left in presence of husband.       Function:     Cognition Comprehension Comprehension assist level: Understands basic 25 - 49% of the time/ requires cueing 50 - 75%  of the time  Expression   Expression assist level: Expresses basic 25 - 49% of the time/requires cueing 50 - 75% of the time. Uses single words/gestures.;Expresses basis less than 25% of the time/requires cueing >75% of the time.  Social Interaction Social Interaction assist level: Interacts appropriately 25 - 49% of time - Needs frequent redirection.  Problem Solving Problem solving assist level: Solves basic less than 25% of  the time - needs direction nearly all the time or does not effectively solve problems and may need a restraint for safety;Solves basic 25 - 49% of the time - needs direction more than half the time to initiate, plan or complete simple activities  Memory Memory assist level: Recognizes or recalls less than 25% of the time/requires cueing greater than 75% of the time   General    Pain    Therapy/Group: Individual Therapy  Kaitlin [name] 03/10/2017, 4:47 PM

## 2017-03-10 NOTE — Progress Notes (Signed)
Subjective/Complaints:  Patient appears brighter today, discussed in team conference with the patient. Overall physical goals are being achieved more quickly than cognitive. Speech aphasia, dysarthric ROS: Limited due to cognition, but appears to deny CP, SOB, N/V/D.  Objective: Vital Signs: Blood pressure (!) 151/40, pulse (!) 55, temperature 97.9 F (36.6 C), temperature source Oral, resp. rate 18, weight 53.5 kg (118 lb), SpO2 100 %. No results found. No results found for this or any previous visit (from the past 72 hour(s)).   HEENT: Normocephalic, atraumatic.  Neuro: Lethargic, Confused,  Motor: 3-/5 RUE (stable) 2-/5 RLE - Apraxia noted Left upper extremity. Left lower extremity 4 plus Dysarthria Musc/Skel:  No tenderness, no edema Gen NAD. Vital signs reivewed   Assessment/Plan: 1. Functional deficits secondary to Left MCA infarct which require 3+ hours per day of interdisciplinary therapy in a comprehensive inpatient rehab setting. Physiatrist is providing close team supervision and 24 hour management of active medical problems listed below. Physiatrist and rehab team continue to assess barriers to discharge/monitor patient progress toward functional and medical goals. FIM: Function - Bathing Position: Shower Body parts bathed by patient: Chest, Abdomen, Left upper leg, Front perineal area, Right arm, Left arm, Right upper leg, Buttocks Body parts bathed by helper: Right lower leg, Left lower leg, Back Assist Level: Touching or steadying assistance(Pt > 75%)  Function- Upper Body Dressing/Undressing What is the patient wearing?: Bra, Pull over shirt/dress Bra - Perfomed by patient: Thread/unthread right bra strap, Thread/unthread left bra strap Bra - Perfomed by helper: Hook/unhook bra (pull down sports bra) Pull over shirt/dress - Perfomed by patient: Thread/unthread right sleeve, Thread/unthread left sleeve, Put head through opening, Pull shirt over trunk Pull over  shirt/dress - Perfomed by helper: Thread/unthread right sleeve, Pull shirt over trunk Assist Level: Touching or steadying assistance(Pt > 75%), 2 helpers Function - Lower Body Dressing/Undressing What is the patient wearing?: Pants, Socks, Shoes, AFO Position: Sitting EOB Pants- Performed by patient: Thread/unthread right pants leg, Thread/unthread left pants leg Pants- Performed by helper: Pull pants up/down Non-skid slipper socks- Performed by patient: Don/doff right sock, Don/doff left sock Non-skid slipper socks- Performed by helper: Don/doff right sock, Don/doff left sock Socks - Performed by patient: Don/doff right sock, Don/doff left sock Socks - Performed by helper: Don/doff right sock, Don/doff left sock Shoes - Performed by patient: Don/doff left shoe Shoes - Performed by helper: Don/doff right shoe, Fasten right, Fasten left AFO - Performed by helper: Don/doff right AFO Assist for footwear: Partial/moderate assist Assist for lower body dressing: Touching or steadying assistance (Pt > 75%)  Function - Toileting Toileting activity did not occur: No continent bowel/bladder event Toileting steps completed by patient: Performs perineal hygiene Toileting steps completed by helper: Adjust clothing prior to toileting, Adjust clothing after toileting Toileting Assistive Devices: Grab bar or rail Assist level: Touching or steadying assistance (Pt.75%)  Function - Archivist transfer assistive device: Grab bar, Walker Assist level to toilet: Touching or steadying assistance (Pt > 75%) Assist level from toilet: Touching or steadying assistance (Pt > 75%) Assist level to bedside commode (at bedside): 2 helpers Assist level from bedside commode (at bedside): 2 helpers  Function - Chair/bed transfer Chair/bed transfer method: Stand pivot Chair/bed transfer assist level: Maximal assist (Pt 25 - 49%/lift and lower) Chair/bed transfer assistive device: Armrests,  Bedrails Chair/bed transfer details: Manual facilitation for weight shifting, Tactile cues for placement, Tactile cues for initiation, Verbal cues for sequencing, Verbal cues for precautions/safety  Function - Locomotion: Wheelchair  Will patient use wheelchair at discharge?: Yes Type: Motorized Max wheelchair distance: 86ft Assist Level: Moderate assistance (Pt 50 - 74%) Assist Level: Moderate assistance (Pt 50 - 74%) Wheel 150 feet activity did not occur: Safety/medical concerns Function - Locomotion: Ambulation Assistive device: Walker-rolling, Other (comment) (Shopping cart) Max distance: 57ft Assist level: Moderate assist (Pt 50 - 74%) Assist level: Moderate assist (Pt 50 - 74%) Walk 50 feet with 2 turns activity did not occur: Safety/medical concerns Assist level: Moderate assist (Pt 50 - 74%) Walk 150 feet activity did not occur: Safety/medical concerns Walk 10 feet on uneven surfaces activity did not occur: Safety/medical concerns  Function - Comprehension Comprehension: Auditory Comprehension assist level: Understands basic 25 - 49% of the time/ requires cueing 50 - 75% of the time  Function - Expression Expression: Verbal Expression assist level: Expresses basic 25 - 49% of the time/requires cueing 50 - 75% of the time. Uses single words/gestures.  Function - Social Interaction Social Interaction assist level: Interacts appropriately less than 25% of the time. May be withdrawn or combative.  Function - Problem Solving Problem solving assist level: Solves basic less than 25% of the time - needs direction nearly all the time or does not effectively solve problems and may need a restraint for safety  Function - Memory Memory assist level: Recognizes or recalls less than 25% of the time/requires cueing greater than 75% of the time Patient normally able to recall (first 3 days only): None of the above  Medical Problem List and Plan: 1.  Right hemiparesis and language  deficits secondary to left MCA infarct  Cont CIR, PT, OT, SLP- patient is progressing towards home discharge but will need 24 7 min assist 2.  DVT Prophylaxis/Anticoagulation: Pharmaceutical: Xarelto and Plavix, high risk of bleeding, monitor 3. Pain Management: tylenol prn 4. Mood: LCSW to follow for evaluation and support. Sleep/wake cycle  5. Neuropsych: This patient is not capable of making decisions on her own behalf. 6. Skin/Wound Care: Routine pressure relief measures.  7. Fluids/Electrolytes/Nutrition: Monitor I/O. Normal BUN and creatinine on 02/23/2017, continue to monitor intake, variable meal intake 45-100 % , oral intake, 720 mL yesterday- IVF at night, able to drink but lacks awareness of thirst, no initiation 8. A fib: Monitor HR bid. Continue amiodarone and metoprolol for HR control.      11. Hypokalemia: Improving with supplement. Will continue to supplement for now.   K+ improved 5.1 on 8/6, reduce potassium to 10 mEq twice a day  Recheck BMET in am, 8/10 12. ABLA: . Monitor for signs of bleeding. Hgb  reduced at 11.7 -> 9.3-> 10. 7-> 10.1 8/6 13. Multiple Sclerosis: No medications? 14. H/o Depression: On Effexor and prozac.  15. H/o CAD s/p PTCA: On Lipitor  16. Lethargy: Overall improved, now mainly due to effects of CVA 17. Labile blood pressure no further med adjustments for now 8/9, on lopressor 50mg  BID with asymptomatic bradycardia   Vitals:   03/09/17 1527 03/10/17 0456  BP: (!) 130/42 (!) 151/40  Pulse: (!) 53 (!) 55  Resp: 18 18  Temp: 97.7 F (36.5 C) 97.9 F (36.6 C)  SpO2: 100% 100%   18. Hypoalbuminemia  Supplement initiated 7/28    LOS (Days) 16 A FACE TO FACE EVALUATION WAS PERFORMED  KIRSTEINS,ANDREW E 03/10/2017, 10:30 AM

## 2017-03-10 NOTE — Progress Notes (Signed)
Physical Therapy Session Note  Patient Details  Name: Kaitlin [name] MRN: [id number] Date of Birth: [birth year]  Today's Date: 03/10/2017 PT Individual Time: 1300-1357 PT Individual Time Calculation (min): 57 min   Short Term Goals: Week 2:  PT Short Term Goal 1 (Week 2): Pt will ambulate 152ft with min assist and LRAD  PT Short Term Goal 2 (Week 2): Pt will propell WC 176ft with supervision assist  PT Short Term Goal 3 (Week 2): Pt will perform bed mobility with supervision assist  PT Short Term Goal 4 (Week 2): Pt will perform bed<>WC transfers with min assist consistently.    Skilled Therapeutic Interventions/Progress Updates: Pt received seated in w/c with husband present, denies pain and agreeable to treatment. Transported to ADL apartment totalA for energy conservation. Performed stand pivot transfer w/c <>flat bed 27" height with minA from therapist. MinA sit <>supine, scooting R/L in bed, rolling to L to position on side. Discussed positioning techniques to prevent skin breakdown and promote comfort. Husband performed transfer w/c <>bed stand pivot x2 trials each direction, and sit <>supine with minA. Pt with slow initiation in all tasks and demonstrates decreased attention to RUE, however improved RUE use with cueing. Gait with RW, R hand splint, R PLS AFO and toe cap with minA overall while walking straight, requires occasional modA when turning d/t poor LLE foot clearance and placement. Gait around cones with RW and modA overall for turning and navigating around obstacles. Standing balance with alternating UE reaching for horseshoes for focus on weight shifting, righting reactions, RUE NMR; occasional LOBs to R side with modA to recover balance/midline. Handoff to SLP at end of session.      Therapy Documentation Precautions:  Precautions Precautions: Fall Precaution Comments: R side inattention; impulsive Restrictions Weight Bearing Restrictions: No Pain: Pain Assessment Pain  Assessment: No/denies pain   See Function Navigator for Current Functional Status.   Therapy/Group: Individual Therapy  Vista Lawman 03/10/2017, 2:04 PM

## 2017-03-10 NOTE — Progress Notes (Signed)
Patient safety maintained.  Patient rested well and no needs/concerns voiced at this time.

## 2017-03-10 NOTE — Progress Notes (Signed)
Occupational Therapy Session Note  Patient Details  Name: Kaitlin [name] MRN: [id number] Date of Birth: [birth year]  Today's Date: 03/10/2017 OT Individual Time: 1500-1600 OT Individual Time Calculation (min): 60 min    Short Term Goals: Week 3:  OT Short Term Goal 1 (Week 3): STG=LTG due to LOS  Skilled Therapeutic Interventions/Progress Updates:    Pt seen for OT session focusing on education/ d/c planning and neuro re-ed. Pt sitting in w/c upon arrival, encouragement for participation due to disorientation of time and situation, however, agreeable to tx session.  Pt transitioned to therapy gym and completed stand pivot transfers throughout session with min A. Pt noted to becoming more impulsive with decreased awareness of deficits and required VCs during all transfers for safety. Completed gross and fine grasping tasks seated EOM using R UE. Therapist held pt's L hand during task in order to force use of R hand. Pt required significantly increased time and VCs for problem solving and motor planning of task. Pt demonstrated ability to manipulate plastic cups and small animal figurines during session. Provided pt's husband with resources for d/c including shower chair with arm rests/ back support, incontinence pads for nighttime use, and bed rail for safety. Provided resources from [employer] and recommendations provided.  Pt returned to room at end of session, voiced wanting to stay sitting up in chair. Pt left with QRB donned, lap tray placed and all needs in reach. RN made aware of pt's position as her husband left during session.   Therapy Documentation Precautions:  Precautions Precautions: Fall Precaution Comments: R side inattention; impulsive Restrictions Weight Bearing Restrictions: No Pain:   No/ denies pain  See Function Navigator for Current Functional Status.   Therapy/Group: Individual Therapy  Lewis, Kristian Covey 03/10/2017, 4:37 PM

## 2017-03-10 NOTE — Progress Notes (Signed)
Nutrition Follow-up  DOCUMENTATION CODES:   Not applicable  INTERVENTION:  Discontinue Ensure.   Continue 30 ml Prostat po BID, each supplement provides 100 kcal and 15 grams of protein.   NUTRITION DIAGNOSIS:   Increased nutrient needs related to  (therapy) as evidenced by estimated needs; ongoing  GOAL:   Patient will meet greater than or equal to 90% of their needs; met  MONITOR:   PO intake, Supplement acceptance, Labs, Weight trends, Skin, I & O's, Diet advancement  REASON FOR ASSESSMENT:   Malnutrition Screening Tool    ASSESSMENT:   71 y.o. female with history of CAD s/p PTCA, MS with gait disorder/memory loss, PAF who was admitted on 02/14/17 with reports of speech difficulty progressing to right sided weakness. CTA  head showed evidence of infarct left insular and left posterior putamen with  occlusion of L-CCA and L-ICA from arch to distal cavernous segment and incidental finding of centrilobar emphysema in lung apices. She underwent cerebral angio with complete revascularization of occluded L-MCA with one pass Solitaire and stent assisted angioplasty of proximal L-ICA with IA integrelin. She was started on dysphagia 2, thin liquids due to mild oropharyngeal dysphagia with stasis.  Pt is currently on a regular diet with thin liquids. Meal completion has been 65-100%. Pt currently has Ensure and Prostat ordered. RD to modify order as intake has improved and now adequate.   Diet Order:  Diet regular Room service appropriate? Yes; Fluid consistency: Thin  Skin:  Reviewed, no issues  Last BM:  8/7  Height:   Ht Readings from Last 1 Encounters:  02/16/17 5' 2"  (1.575 m)    Weight:   Wt Readings from Last 1 Encounters:  03/06/17 118 lb (53.5 kg)    Ideal Body Weight:  50 kg  BMI:  Body mass index is 21.58 kg/m.  Estimated Nutritional Needs:   Kcal:  1550-1750  Protein:  60-70 grams  Fluid:  >/= 1.5 L/day  EDUCATION NEEDS:   No education needs  identified at this time  Corrin Parker, MS, RD, LDN Pager # [phone] After hours/ weekend pager # [phone]

## 2017-03-10 NOTE — Progress Notes (Deleted)
Patient safety maintained.  Patient rested well during the night and denies needs/concerns at this time.

## 2017-03-11 ENCOUNTER — Inpatient Hospital Stay (HOSPITAL_COMMUNITY): Payer: Medicare Other | Admitting: Speech Pathology

## 2017-03-11 ENCOUNTER — Inpatient Hospital Stay (HOSPITAL_COMMUNITY): Payer: Self-pay

## 2017-03-11 ENCOUNTER — Inpatient Hospital Stay (HOSPITAL_COMMUNITY): Payer: Self-pay | Admitting: Occupational Therapy

## 2017-03-11 LAB — BASIC METABOLIC PANEL
Anion gap: 13 (ref 5–15)
BUN: 23 mg/dL — ABNORMAL HIGH (ref 6–20)
CHLORIDE: 105 mmol/L (ref 101–111)
CO2: 19 mmol/L — AB (ref 22–32)
Calcium: 8.9 mg/dL (ref 8.9–10.3)
Creatinine, Ser: 0.74 mg/dL (ref 0.44–1.00)
GFR calc non Af Amer: >60 mL/min (ref >60)
Glucose, Bld: 98 mg/dL (ref 65–99)
POTASSIUM: 5.3 mmol/L — AB (ref 3.5–5.1)
SODIUM: 137 mmol/L (ref 135–145)

## 2017-03-11 NOTE — Progress Notes (Addendum)
Occupational Therapy Session Note  Patient Details  Name: Kaitlin [name] MRN: [id number] Date of Birth: 07-Jul-1946  Today's Date: 03/11/2017 OT Individual Time: 1300-1400 OT Individual Time Calculation (min): 60 min    Short Term Goals: Week 3:  OT Short Term Goal 1 (Week 3): STG=LTG due to LOS  Skilled Therapeutic Interventions/Progress Updates:    Pt seen for OT ADL bathing/dressing session. Pt sitting up in w/c upon arrival, agreeable to tx session. She ambulated throughout room with RW and min A, physical assist required for proper management of RW during functional tasks. Completed toileting task with heavy steadying assist, pt able to complete 3/3 tasks.  She bathed in shower seated on 3-1 BSC, used R UE at gross assist level with significantly increased time. Pt impulsive when exiting shower, attempting to stand independently while therapist gathered w/c, requiring quick assistance to safely return to [facility]. Pt dressed seated in w/c. She required increased assist for threading of R UE into bra and shirt despite multimodal cuing for hemi technique. She was able to maintain static standing balance without UE support while therapist threaded brief with close supervision. Grooming tasks completed from w/c level at sink. Pt able to use B UEs to manipulate containers with increased time. Addressed orientation, pt oriented to person only. Was unable to immedately recall orientation facts.  Pt left seated in w/c at end of session, all needs in reach and QRB donned.   Therapy Documentation Precautions:  Precautions Precautions: Fall Precaution Comments: R side inattention; impulsive Restrictions Weight Bearing Restrictions: No Pain:   No/ denies pain  See Function Navigator for Current Functional Status.   Therapy/Group: Individual Therapy  Lewis, Amias Hutchinson C 03/11/2017, 7:10 AM

## 2017-03-11 NOTE — Progress Notes (Signed)
Speech Language Pathology Daily Session Note  Patient Details  Name: Kaitlin [name] MRN: [id number] Date of Birth: 1946/02/19  Today's Date: 03/11/2017 SLP Individual Time: 1030-1100 SLP Individual Time Calculation (min): 30 min  Short Term Goals: Week 3: SLP Short Term Goal 1 (Week 3): Pt will utilize word finding strategies to name common objects with 50% accuracy and Mod A cues.  SLP Short Term Goal 2 (Week 3): Pt will sustain attention to basic functional task for ~ 30 minutes with Mod A. SLP Short Term Goal 3 (Week 3): Pt will complete basic familiar task during ADLs with Mod A cues.  SLP Short Term Goal 4 (Week 3): Pt will utilize speech intelligibility strategies to achieve ~ 75% intelligibility at the word level with Mod A cues.   Skilled Therapeutic Interventions: Skilled treatment session focused on cognition goals. SLP facilitated session by providing Max A to Mod A to complete basic previously learned card game. Pt required Min A cues to look at pictures on her right. Pt returned to room, left upright in wheelchair with husband present.      Function:    Cognition Comprehension Comprehension assist level: Understands basic 25 - 49% of the time/ requires cueing 50 - 75% of the time  Expression   Expression assist level: Expresses basic 25 - 49% of the time/requires cueing 50 - 75% of the time. Uses single words/gestures.;Expresses basis less than 25% of the time/requires cueing >75% of the time.  Social Interaction Social Interaction assist level: Interacts appropriately 25 - 49% of time - Needs frequent redirection.  Problem Solving Problem solving assist level: Solves basic less than 25% of the time - needs direction nearly all the time or does not effectively solve problems and may need a restraint for safety;Solves basic 25 - 49% of the time - needs direction more than half the time to initiate, plan or complete simple activities  Memory Memory assist level: Recognizes or  recalls less than 25% of the time/requires cueing greater than 75% of the time    Pain    Therapy/Group: Individual Therapy  Analie Katzman 03/11/2017, 1:34 PM

## 2017-03-11 NOTE — Progress Notes (Signed)
Subjective/Complaints:  Eating breakfast, husband at bedside. He reports that she called him with nursing assistance. Yesterday, and was able to say that she was in [place] at [facility]. Patient states she is not in [place]. This a.m. Speech aphasia, dysarthric ROS: Limited due to cognition, but appears to deny CP, SOB, N/V/D.  Objective: Vital Signs: Blood pressure (!) 156/62, pulse (!) 52, temperature (!) 97.5 F (36.4 C), temperature source Oral, resp. rate 17, weight 53.5 kg (118 lb), SpO2 99 %. No results found. Results for orders placed or performed during the hospital encounter of 02/22/17 (from the past 72 hour(s))  Basic metabolic panel     Status: Abnormal   Collection Time: 03/11/17  5:23 AM  Result Value Ref Range   Sodium 137 135 - 145 mmol/L   Potassium 5.3 (H) 3.5 - 5.1 mmol/L    Comment: HEMOLYSIS AT THIS LEVEL MAY AFFECT RESULT   Chloride 105 101 - 111 mmol/L   CO2 19 (L) 22 - 32 mmol/L   Glucose, Bld 98 65 - 99 mg/dL   BUN 23 (H) 6 - 20 mg/dL   Creatinine, Ser 0.74 0.44 - 1.00 mg/dL   Calcium 8.9 8.9 - 10.3 mg/dL   GFR calc non Af Amer >60 >60 mL/min   GFR calc Af Amer >60 >60 mL/min    Comment: (NOTE) The eGFR has been calculated using the CKD EPI equation. This calculation has not been validated in all clinical situations. eGFR's persistently <60 mL/min signify possible Chronic Kidney Disease.    Anion gap 13 5 - 15     HEENT: Normocephalic, atraumatic.  Neuro: Lethargic, Confused,  Motor: 3-/5 RUE (stable) 2-/5 RLE - Apraxia noted Left upper extremity. Left lower extremity 4 plus Dysarthria Musc/Skel:  No tenderness, no edema Gen NAD. Vital signs reivewed   Assessment/Plan: 1. Functional deficits secondary to Left MCA infarct which require 3+ hours per day of interdisciplinary therapy in a comprehensive inpatient rehab setting. Physiatrist is providing close team supervision and 24 hour management of active medical problems listed  below. Physiatrist and rehab team continue to assess barriers to discharge/monitor patient progress toward functional and medical goals. FIM: Function - Bathing Position: Shower Body parts bathed by patient: Chest, Abdomen, Left upper leg, Front perineal area, Right arm, Left arm, Right upper leg, Buttocks Body parts bathed by helper: Right lower leg, Left lower leg, Back Assist Level: Touching or steadying assistance(Pt > 75%)  Function- Upper Body Dressing/Undressing What is the patient wearing?: Bra, Pull over shirt/dress Bra - Perfomed by patient: Thread/unthread right bra strap, Thread/unthread left bra strap Bra - Perfomed by helper: Hook/unhook bra (pull down sports bra) Pull over shirt/dress - Perfomed by patient: Thread/unthread right sleeve, Thread/unthread left sleeve, Put head through opening, Pull shirt over trunk Pull over shirt/dress - Perfomed by helper: Thread/unthread right sleeve, Pull shirt over trunk Assist Level: Touching or steadying assistance(Pt > 75%), 2 helpers Function - Lower Body Dressing/Undressing What is the patient wearing?: Pants, Socks, Shoes, AFO Position: Sitting EOB Pants- Performed by patient: Thread/unthread right pants leg, Thread/unthread left pants leg Pants- Performed by helper: Pull pants up/down Non-skid slipper socks- Performed by patient: Don/doff right sock, Don/doff left sock Non-skid slipper socks- Performed by helper: Don/doff right sock, Don/doff left sock Socks - Performed by patient: Don/doff right sock, Don/doff left sock Socks - Performed by helper: Don/doff right sock, Don/doff left sock Shoes - Performed by patient: Don/doff left shoe Shoes - Performed by helper: Don/doff right shoe,  Fasten right, Fasten left AFO - Performed by helper: Don/doff right AFO Assist for footwear: Partial/moderate assist Assist for lower body dressing: Touching or steadying assistance (Pt > 75%)  Function - Toileting Toileting activity did not  occur: No continent bowel/bladder event Toileting steps completed by patient: Performs perineal hygiene Toileting steps completed by helper: Adjust clothing prior to toileting, Adjust clothing after toileting Toileting Assistive Devices: Grab bar or rail Assist level: Touching or steadying assistance (Pt.75%)  Function - Air cabin crew transfer assistive device: Grab bar, Walker Assist level to toilet: Touching or steadying assistance (Pt > 75%) Assist level from toilet: Touching or steadying assistance (Pt > 75%) Assist level to bedside commode (at bedside): 2 helpers Assist level from bedside commode (at bedside): 2 helpers  Function - Chair/bed transfer Chair/bed transfer method: Stand pivot Chair/bed transfer assist level: Touching or steadying assistance (Pt > 75%) Chair/bed transfer assistive device: Armrests Chair/bed transfer details: Manual facilitation for weight shifting, Tactile cues for placement, Tactile cues for initiation, Verbal cues for sequencing, Verbal cues for precautions/safety, Verbal cues for safe use of DME/AE  Function - Locomotion: Wheelchair Will patient use wheelchair at discharge?: Yes Type: Motorized Max wheelchair distance: 25f Assist Level: Moderate assistance (Pt 50 - 74%) Assist Level: Moderate assistance (Pt 50 - 74%) Wheel 150 feet activity did not occur: Safety/medical concerns Function - Locomotion: Ambulation Assistive device: Walker-rolling, Orthosis, Other (comment) (toe cap) Max distance: 75 Assist level: Touching or steadying assistance (Pt > 75%) Assist level: Touching or steadying assistance (Pt > 75%) Walk 50 feet with 2 turns activity did not occur: Safety/medical concerns Assist level: Moderate assist (Pt 50 - 74%) Walk 150 feet activity did not occur: Safety/medical concerns Walk 10 feet on uneven surfaces activity did not occur: Safety/medical concerns  Function - Comprehension Comprehension: Auditory Comprehension  assist level: Understands basic 25 - 49% of the time/ requires cueing 50 - 75% of the time  Function - Expression Expression: Verbal Expression assist level: Expresses basic 25 - 49% of the time/requires cueing 50 - 75% of the time. Uses single words/gestures., Expresses basis less than 25% of the time/requires cueing >75% of the time.  Function - Social Interaction Social Interaction assist level: Interacts appropriately 25 - 49% of time - Needs frequent redirection.  Function - Problem Solving Problem solving assist level: Solves basic less than 25% of the time - needs direction nearly all the time or does not effectively solve problems and may need a restraint for safety, Solves basic 25 - 49% of the time - needs direction more than half the time to initiate, plan or complete simple activities  Function - Memory Memory assist level: Recognizes or recalls less than 25% of the time/requires cueing greater than 75% of the time Patient normally able to recall (first 3 days only): None of the above  Medical Problem List and Plan: 1.  Right hemiparesis and language deficits secondary to left MCA infarct  Cont CIR, PT, OT, SLP- patient is progressing towards home discharge but will need 24 7 min assist, working towards 03/19/2017. Discharge 2.  DVT Prophylaxis/Anticoagulation: Pharmaceutical: Xarelto and Plavix, high risk of bleeding, monitor 3. Pain Management: tylenol prn 4. Mood: LCSW to follow for evaluation and support. Sleep/wake cycle  5. Neuropsych: This patient is not capable of making decisions on her own behalf. 6. Skin/Wound Care: Routine pressure relief measures.  7. Fluids/Electrolytes/Nutrition: Monitor I/O. Normal BUN and creatinine on 02/23/2017, continue to monitor intake, variable meal intake 45-100 % ,  oral intake, 720 mL yesterday- IVF at night, able to drink but lacks awareness of thirst, no initiation 8. A fib: Monitor HR bid. Continue amiodarone and metoprolol for HR  control.      11. Hypokalemia: Improving with supplement. Will continue to supplement for now.   K+ improved 5.1 on 8/6, reduce potassium to 10 mEq twice a day  Recheck BMET in am, 8/10, potassium 5.3 with some hemolysis, will discontinue potassium and repeat 12. ABLA: . Monitor for signs of bleeding. Hgb  reduced at 11.7 -> 9.3-> 10. 7-> 10.1 8/6 13. Multiple Sclerosis: No medications? 14. H/o Depression: On Effexor and prozac.  59. H/o CAD s/p PTCA: On Lipitor  16. Lethargy: Overall improved, now mainly due to effects of CVA 17. Labile blood pressure no further med adjustments for now 8/10, on lopressor 52m BID with asymptomatic bradycardia   Vitals:   03/10/17 2108 03/11/17 0500  BP: (!) 120/54 (!) 156/62  Pulse: (!) 57 (!) 52  Resp:  17  Temp:  (!) 97.5 F (36.4 C)  SpO2:  99%   18. Hypoalbuminemia  Supplement initiated 7/28    LOS (Days) 17 A FACE TO FACE EVALUATION WAS PERFORMED  KIRSTEINS,ANDREW E 03/11/2017, 8:40 AM

## 2017-03-11 NOTE — Progress Notes (Signed)
Physical Therapy Weekly Progress Note  Patient Details  Name: Kaitlin [name] MRN: [id number] Date of Birth: March 14, 1946  Beginning of progress report period: March 03, 2017 End of progress report period: March 11, 2017  Today's Date: 03/11/2017 PT Individual Time: 0900-1012, 9937-1696 PT Individual Time Calculation (min): 72 min , 30 min  Patient has met 4 of 4 short term goals.  Pt is improving with activity tolerance, alertness and all functional mobility. Pt still demonstrates decreased R sided awareness and attention as seen during gait and functional activities, requiring verbal cues for awareness. Pt is able to walk up to 150 ft using a RW and min assist but still needs cues for R step length, width and toe clearance. Overall pt is progressing well.   Patient continues to demonstrate the following deficits muscle weakness and muscle joint tightness, impaired timing and sequencing, abnormal tone, unbalanced muscle activation, decreased coordination and decreased motor planning, decreased attention and decreased problem solving and decreased sitting balance, decreased standing balance, decreased postural control, hemiplegia and decreased balance strategies and therefore will continue to benefit from skilled PT intervention to increase functional independence with mobility.  Patient progressing toward long term goals..  Continue plan of care.  PT Short Term Goals Week 2:  PT Short Term Goal 1 (Week 2): Pt will ambulate 147f with min assist and LRAD  PT Short Term Goal 1 - Progress (Week 2): Met PT Short Term Goal 2 (Week 2): Pt will propell WC [id number] supervision assist  PT Short Term Goal 2 - Progress (Week 2): Met PT Short Term Goal 3 (Week 2): Pt will perform bed mobility with supervision assist  PT Short Term Goal 3 - Progress (Week 2): Met PT Short Term Goal 4 (Week 2): Pt will perform bed<>WC transfers with min assist consistently.   PT Short Term Goal 4 - Progress (Week 2):  Met Week 3:  PT Short Term Goal 1 (Week 3): STG =LTG due to ELOS  Skilled Therapeutic Interventions/Progress Updates:  Ambulation/gait training;Balance/vestibular training;Cognitive remediation/compensation;Community reintegration;Discharge planning;Disease management/prevention;DME/adaptive equipment instruction;Functional electrical stimulation;Functional mobility training;Neuromuscular re-education;Pain management;Psychosocial support;Splinting/orthotics;Stair training;Therapeutic Activities;Patient/family education;UE/LE Strength taining/ROM;UE/LE Coordination activities;Visual/perceptual remediation/compensation;Therapeutic Exercise;Wheelchair propulsion/positioning   Session 1: Pt supine in bed upon PT arrival, sleeping and somewhat difficult to arouse. Pt agreeable to therapy tx and husband present for therapy session. Pt transferred from supine to sitting EOB with supervision. Pt donned pants, bra and shirt with min assist, working on dynamic seated balance. Pt standing without UE support to pull up pants over hips, min assist needed to maintain balance. Pt transferred from bed to w/c stand step with min assist. Pt propelled w/ 100 ft to the gym using B LEs, verbal cues needed to attend to R LE, with supervision as pt gets easily distracted. Pt ambulated x 80 ft and x 130 ft using RW and min assist, verbal cues for increased R step length especially with turning. Pt practiced bed mobility on the mat set at height 23 inches to simulate her bed height at home, pt able to get in and out of bed, from sitting <>supine with supervision, verbal cues for sequencing. Pt performed sit to stands x 8 with focus on LE strengthening and without UE support, verbal cues for eccentric control during lowering. Pt propelled w/c back to gym with increased time to complete, left seated in w/c with husband present and needs in reach. PT denies pain this session.   Session 2: Pt seated in w/c upon PT arrival, agreeable to  therapy session. Pt pushed to gym in w/c total assist to save time and pt energy. Session focused on dynamic standing balance. Pt standing on foam with R UE support on walker, using L UE to match playing cards on board, pt with moderate difficulty matching cards with correct shape and color. Pt requires verbal cues to attend to cards in R visual field. Trail number 2 pt standing on ground using L UE to reach for playing cards, pt able to grasp card with L hand but then switches card to R hand to place on board, still with difficulty finding correct match. Pt able to maintain balance throughout both trials with min assist. Pt left seated in w/c with needs in reach and QRB in place. Pt denies pain this session.   Therapy Documentation Precautions:  Precautions Precautions: Fall Precaution Comments: R side inattention; impulsive Restrictions Weight Bearing Restrictions: No Pain: Pt denies pain this session   See Function Navigator for Current Functional Status.  Therapy/Group: Individual Therapy  Netta Corrigan, PT, DPT 03/11/2017, 12:33 PM

## 2017-03-12 ENCOUNTER — Inpatient Hospital Stay (HOSPITAL_COMMUNITY): Payer: Medicare Other | Admitting: Occupational Therapy

## 2017-03-12 LAB — BASIC METABOLIC PANEL
Anion gap: 7 (ref 5–15)
BUN: 16 mg/dL (ref 6–20)
CALCIUM: 8.9 mg/dL (ref 8.9–10.3)
CHLORIDE: 107 mmol/L (ref 101–111)
CO2: 24 mmol/L (ref 22–32)
CREATININE: 0.73 mg/dL (ref 0.44–1.00)
Glucose, Bld: 98 mg/dL (ref 65–99)
Potassium: 4.2 mmol/L (ref 3.5–5.1)
SODIUM: 138 mmol/L (ref 135–145)

## 2017-03-12 NOTE — Progress Notes (Signed)
Occupational Therapy Session Note  Patient Details  Name: Kaitlin [name] MRN: [id number] Date of Birth: 1946-04-22  Today's Date: 03/12/2017 OT Individual Time: 0804-0900 OT Individual Time Calculation (min): 56 min    Short Term Goals: Week 3:  OT Short Term Goal 1 (Week 3): STG=LTG due to LOS  Skilled Therapeutic Interventions/Progress Updates:    Pt seen for BADL training with a focus on active use of RUE and dynamic balance. Pt received in bed with spouse in the room. Pt practiced rolling onto her R side and then moving into sitting from a flat bed without rails with touching A.  Pt used RW to ambulate to toilet.  Pt now has a stronger grip, so walker splint removed. Pt ambulated to w/c for dressing. She declined bathing as she had just had a shower yesterday afternoon. With bra prefastened pt donned bra overhead with min a.  She actively placed R hand into sleeve and then needed A to manage clothing as the sleeve slipped back down to the wrist.  She needed some help to get the fabric over her R shoulder.  In standing, focused on foot placement to give her a better base of support. She actively used R hand to grasp underwear to pull over hips.   She stood at sink with touching A to brush hair and don lip cream.   Pt worked on bimanual tasks of opening/ closing containers and placing and pulling apart pegs actively using R hand with occasional A and cues. Educated pt and spouse on a bimanual AROM exercise she can do from the chair to work on Bilateral coordination.  Pt resting in w/c with lap tray and spouse in room with pt.   Therapy Documentation Precautions:  Precautions Precautions: Fall Precaution Comments: R side inattention; impulsive Restrictions Weight Bearing Restrictions: No     Pain: Pain Assessment Pain Assessment: No/denies pain ADL:  See Function Navigator for Current Functional Status.   Therapy/Group: Individual Therapy  Ferdinand Revoir 03/12/2017, 12:43 PM

## 2017-03-12 NOTE — Progress Notes (Signed)
Subjective/Complaints: Patient seen lying in bed this morning. She states she slept well overnight, but is still sleepy this morning.  ROS: Ltd. due to cognition, but appears to deny CP, SOB, N/V/D.  Objective: Vital Signs: Blood pressure (!) 139/47, pulse (!) 58, temperature 97.9 F (36.6 C), temperature source Oral, resp. rate 16, weight 53.5 kg (118 lb), SpO2 100 %. No results found. Results for orders placed or performed during the hospital encounter of 02/22/17 (from the past 72 hour(s))  Basic metabolic panel     Status: Abnormal   Collection Time: 03/11/17  5:23 AM  Result Value Ref Range   Sodium 137 135 - 145 mmol/L   Potassium 5.3 (H) 3.5 - 5.1 mmol/L    Comment: HEMOLYSIS AT THIS LEVEL MAY AFFECT RESULT   Chloride 105 101 - 111 mmol/L   CO2 19 (L) 22 - 32 mmol/L   Glucose, Bld 98 65 - 99 mg/dL   BUN 23 (H) 6 - 20 mg/dL   Creatinine, Ser 0.74 0.44 - 1.00 mg/dL   Calcium 8.9 8.9 - 10.3 mg/dL   GFR calc non Af Amer >60 >60 mL/min   GFR calc Af Amer >60 >60 mL/min    Comment: (NOTE) The eGFR has been calculated using the CKD EPI equation. This calculation has not been validated in all clinical situations. eGFR's persistently <60 mL/min signify possible Chronic Kidney Disease.    Anion gap 13 5 - 15  Basic metabolic panel     Status: None   Collection Time: 03/12/17  5:49 AM  Result Value Ref Range   Sodium 138 135 - 145 mmol/L   Potassium 4.2 3.5 - 5.1 mmol/L   Chloride 107 101 - 111 mmol/L   CO2 24 22 - 32 mmol/L   Glucose, Bld 98 65 - 99 mg/dL   BUN 16 6 - 20 mg/dL   Creatinine, Ser 0.73 0.44 - 1.00 mg/dL   Calcium 8.9 8.9 - 10.3 mg/dL   GFR calc non Af Amer >60 >60 mL/min   GFR calc Af Amer >60 >60 mL/min    Comment: (NOTE) The eGFR has been calculated using the CKD EPI equation. This calculation has not been validated in all clinical situations. eGFR's persistently <60 mL/min signify possible Chronic Kidney Disease.    Anion gap 7 5 - 15     HEENT:  Normocephalic, atraumatic. Cardio: RRR. No JVD Pulm: Clear. Unlabored Neuro: Lethargic, Confused  Motor: 3-/5 RUE (unchanged, ?effort) 2-/5 RLE (?effort) Left upper extremity. Left lower extremity 4+/5 Dysarthria Musc/Skel:  No tenderness, no edema Gen NAD. Vital signs reivewed   Assessment/Plan: 1. Functional deficits secondary to Left MCA infarct which require 3+ hours per day of interdisciplinary therapy in a comprehensive inpatient rehab setting. Physiatrist is providing close team supervision and 24 hour management of active medical problems listed below. Physiatrist and rehab team continue to assess barriers to discharge/monitor patient progress toward functional and medical goals. FIM: Function - Bathing Position: Shower Body parts bathed by patient: Chest, Abdomen, Left upper leg, Front perineal area, Right arm, Left arm, Right upper leg, Buttocks Body parts bathed by helper: Right lower leg, Left lower leg, Back Assist Level: Touching or steadying assistance(Pt > 75%)  Function- Upper Body Dressing/Undressing What is the patient wearing?: Bra, Pull over shirt/dress Bra - Perfomed by patient: Thread/unthread right bra strap, Thread/unthread left bra strap Bra - Perfomed by helper: Hook/unhook bra (pull down sports bra) Pull over shirt/dress - Perfomed by patient: Thread/unthread left sleeve, Put  head through opening, Pull shirt over trunk Pull over shirt/dress - Perfomed by helper: Thread/unthread right sleeve Assist Level: Touching or steadying assistance(Pt > 75%), 2 helpers Function - Lower Body Dressing/Undressing What is the patient wearing?: Pants, Socks, Shoes, AFO Position: Wheelchair/chair at sink Pants- Performed by patient: Thread/unthread right pants leg, Thread/unthread left pants leg Pants- Performed by helper: Pull pants up/down Non-skid slipper socks- Performed by patient: Don/doff right sock, Don/doff left sock Non-skid slipper socks- Performed by helper:  Don/doff right sock, Don/doff left sock Socks - Performed by patient: Don/doff right sock, Don/doff left sock Socks - Performed by helper: Don/doff right sock, Don/doff left sock Shoes - Performed by patient: Don/doff left shoe Shoes - Performed by helper: Don/doff right shoe, Fasten right, Fasten left AFO - Performed by helper: Don/doff right AFO Assist for footwear: Partial/moderate assist Assist for lower body dressing: Touching or steadying assistance (Pt > 75%)  Function - Toileting Toileting activity did not occur: No continent bowel/bladder event Toileting steps completed by patient: Performs perineal hygiene, Adjust clothing prior to toileting, Adjust clothing after toileting Toileting steps completed by helper: Adjust clothing prior to toileting Toileting Assistive Devices: Grab bar or rail Assist level: Touching or steadying assistance (Pt.75%)  Function - Air cabin crew transfer assistive device: Grab bar, Walker Assist level to toilet: Touching or steadying assistance (Pt > 75%) Assist level from toilet: Touching or steadying assistance (Pt > 75%) Assist level to bedside commode (at bedside): 2 helpers Assist level from bedside commode (at bedside): 2 helpers  Function - Chair/bed transfer Chair/bed transfer method: Stand pivot Chair/bed transfer assist level: Touching or steadying assistance (Pt > 75%) Chair/bed transfer assistive device: Armrests Chair/bed transfer details: Manual facilitation for weight shifting, Tactile cues for placement, Tactile cues for initiation, Verbal cues for sequencing, Verbal cues for precautions/safety, Verbal cues for safe use of DME/AE  Function - Locomotion: Wheelchair Will patient use wheelchair at discharge?: Yes Type: Manual Max wheelchair distance: 100 ft Assist Level: Supervision or verbal cues Assist Level: Supervision or verbal cues Wheel 150 feet activity did not occur: Safety/medical concerns Function - Locomotion:  Ambulation Assistive device: Orthosis, Walker-rolling Max distance: 130 ft Assist level: Touching or steadying assistance (Pt > 75%) Assist level: Touching or steadying assistance (Pt > 75%) Walk 50 feet with 2 turns activity did not occur: Safety/medical concerns Assist level: Touching or steadying assistance (Pt > 75%) Walk 150 feet activity did not occur: Safety/medical concerns Walk 10 feet on uneven surfaces activity did not occur: Safety/medical concerns  Function - Comprehension Comprehension: Auditory Comprehension assist level: Understands basic 25 - 49% of the time/ requires cueing 50 - 75% of the time  Function - Expression Expression: Verbal Expression assist level: Expresses basic 25 - 49% of the time/requires cueing 50 - 75% of the time. Uses single words/gestures.  Function - Social Interaction Social Interaction assist level: Interacts appropriately 25 - 49% of time - Needs frequent redirection.  Function - Problem Solving Problem solving assist level: Solves basic 25 - 49% of the time - needs direction more than half the time to initiate, plan or complete simple activities  Function - Memory Memory assist level: Recognizes or recalls less than 25% of the time/requires cueing greater than 75% of the time Patient normally able to recall (first 3 days only): None of the above  Medical Problem List and Plan: 1.  Right hemiparesis and language deficits secondary to left MCA infarct  Cont CIR,  2.  DVT Prophylaxis/Anticoagulation: Pharmaceutical: Xarelto  and Plavix, high risk of bleeding, monitor 3. Pain Management: tylenol prn 4. Mood: LCSW to follow for evaluation and support. Sleep/wake cycle  5. Neuropsych: This patient is not capable of making decisions on her own behalf. 6. Skin/Wound Care: Routine pressure relief measures.  7. Fluids/Electrolytes/Nutrition: Monitor I/Os.  Normal BUN and creatinine on 02/23/2017, continue to monitor intake 8. A fib: Monitor HR  bid. Continue amiodarone and metoprolol for HR control.   9. Hypokalemia: Resolved  K+ 4.2 on 8/11  Cont to monitor 10. ABLA: . Monitor for signs of bleeding.   Hgb  10.1 on 8/6 13. Multiple Sclerosis: No medications? 14. H/o Depression: On Effexor and prozac.  59. H/o CAD s/p PTCA: On Lipitor  16. Lethargy: Overall improved, now mainly due to effects of CVA  17. Labile blood pressure no further med adjustments for now 8/10, on lopressor 74m BID   Cont to monitor  Vitals:   03/11/17 1500 03/12/17 0527  BP: (!) 134/49 (!) 139/47  Pulse: (!) 57 (!) 58  Resp:  16  Temp:  97.9 F (36.6 C)  SpO2:  100%   18. Hypoalbuminemia  Supplement initiated 7/28    LOS (Days) 18 A FACE TO FACE EVALUATION WAS PERFORMED  Ankit ALorie Phenix8/06/2017, 9:03 AM

## 2017-03-13 ENCOUNTER — Inpatient Hospital Stay (HOSPITAL_COMMUNITY): Payer: Self-pay

## 2017-03-13 NOTE — Progress Notes (Signed)
Subjective/Complaints: Patient seen lying in bed this morning. She slept well per sleep  chart.  ROS: Limited due to cognition, but appears to deny CP, SOB, N/V/D.  Objective: Vital Signs: Blood pressure 137/62, pulse (!) 51, temperature 98.3 F (36.8 C), temperature source Oral, resp. rate 18, weight 53.5 kg (118 lb), SpO2 100 %. No results found. Results for orders placed or performed during the hospital encounter of 02/22/17 (from the past 72 hour(s))  Basic metabolic panel     Status: Abnormal   Collection Time: 03/11/17  5:23 AM  Result Value Ref Range   Sodium 137 135 - 145 mmol/L   Potassium 5.3 (H) 3.5 - 5.1 mmol/L    Comment: HEMOLYSIS AT THIS LEVEL MAY AFFECT RESULT   Chloride 105 101 - 111 mmol/L   CO2 19 (L) 22 - 32 mmol/L   Glucose, Bld 98 65 - 99 mg/dL   BUN 23 (H) 6 - 20 mg/dL   Creatinine, Ser 0.74 0.44 - 1.00 mg/dL   Calcium 8.9 8.9 - 10.3 mg/dL   GFR calc non Af Amer >60 >60 mL/min   GFR calc Af Amer >60 >60 mL/min    Comment: (NOTE) The eGFR has been calculated using the CKD EPI equation. This calculation has not been validated in all clinical situations. eGFR's persistently <60 mL/min signify possible Chronic Kidney Disease.    Anion gap 13 5 - 15  Basic metabolic panel     Status: None   Collection Time: 03/12/17  5:49 AM  Result Value Ref Range   Sodium 138 135 - 145 mmol/L   Potassium 4.2 3.5 - 5.1 mmol/L   Chloride 107 101 - 111 mmol/L   CO2 24 22 - 32 mmol/L   Glucose, Bld 98 65 - 99 mg/dL   BUN 16 6 - 20 mg/dL   Creatinine, Ser 0.73 0.44 - 1.00 mg/dL   Calcium 8.9 8.9 - 10.3 mg/dL   GFR calc non Af Amer >60 >60 mL/min   GFR calc Af Amer >60 >60 mL/min    Comment: (NOTE) The eGFR has been calculated using the CKD EPI equation. This calculation has not been validated in all clinical situations. eGFR's persistently <60 mL/min signify possible Chronic Kidney Disease.    Anion gap 7 5 - 15     HEENT: Normocephalic, atraumatic. Cardio: RRR.  No JVD Pulm: Clear. Unlabored Neuro: Somnolent  Motor: 3-/5 RUE (unchanged, ?effort) 2-/5 RLE (?effort) Left upper extremity. Left lower extremity 4+/5 Dysarthria Musc/Skel:  No tenderness, no edema Gen NAD. Vital signs reivewed   Assessment/Plan: 1. Functional deficits secondary to Left MCA infarct which require 3+ hours per day of interdisciplinary therapy in a comprehensive inpatient rehab setting. Physiatrist is providing close team supervision and 24 hour management of active medical problems listed below. Physiatrist and rehab team continue to assess barriers to discharge/monitor patient progress toward functional and medical goals. FIM: Function - Bathing Position: Shower Body parts bathed by patient: Chest, Abdomen, Left upper leg, Front perineal area, Right arm, Left arm, Right upper leg, Buttocks Body parts bathed by helper: Right lower leg, Left lower leg, Back Assist Level: Touching or steadying assistance(Pt > 75%)  Function- Upper Body Dressing/Undressing What is the patient wearing?: Bra, Pull over shirt/dress Bra - Perfomed by patient: Thread/unthread right bra strap, Thread/unthread left bra strap Bra - Perfomed by helper: Hook/unhook bra (pull down sports bra) Pull over shirt/dress - Perfomed by patient: Thread/unthread left sleeve, Put head through opening, Thread/unthread right sleeve Pull  over shirt/dress - Perfomed by helper: Pull shirt over trunk Assist Level: Touching or steadying assistance(Pt > 75%), 2 helpers Function - Lower Body Dressing/Undressing What is the patient wearing?: Pants, Socks, Shoes, Underwear Position: Wheelchair/chair at [employer] - Performed by patient: Thread/unthread right underwear leg, Thread/unthread left underwear leg, Pull underwear up/down Pants- Performed by patient: Thread/unthread right pants leg, Thread/unthread left pants leg Pants- Performed by helper: Pull pants up/down Non-skid slipper socks- Performed by patient:  Don/doff right sock, Don/doff left sock Non-skid slipper socks- Performed by helper: Don/doff right sock, Don/doff left sock Socks - Performed by patient: Don/doff right sock, Don/doff left sock Socks - Performed by helper: Don/doff right sock, Don/doff left sock Shoes - Performed by patient: Don/doff left shoe, Don/doff right shoe (slip on shoes) Shoes - Performed by helper: Don/doff right shoe, Fasten right, Fasten left AFO - Performed by helper: Don/doff right AFO Assist for footwear: Partial/moderate assist Assist for lower body dressing: Touching or steadying assistance (Pt > 75%)  Function - Toileting Toileting activity did not occur: No continent bowel/bladder event Toileting steps completed by patient: Performs perineal hygiene, Adjust clothing prior to toileting Toileting steps completed by helper: Adjust clothing prior to toileting, Performs perineal hygiene, Adjust clothing after toileting Toileting Assistive Devices: Grab bar or rail Assist level: Touching or steadying assistance (Pt.75%)  Function - Air cabin crew transfer assistive device: Grab bar, Walker Assist level to toilet: Touching or steadying assistance (Pt > 75%) Assist level from toilet: Touching or steadying assistance (Pt > 75%) Assist level to bedside commode (at bedside): 2 helpers Assist level from bedside commode (at bedside): 2 helpers  Function - Chair/bed transfer Chair/bed transfer method: Stand pivot Chair/bed transfer assist level: Touching or steadying assistance (Pt > 75%) Chair/bed transfer assistive device: Armrests Chair/bed transfer details: Manual facilitation for weight shifting, Tactile cues for placement, Tactile cues for initiation, Verbal cues for sequencing, Verbal cues for precautions/safety, Verbal cues for safe use of DME/AE  Function - Locomotion: Wheelchair Will patient use wheelchair at discharge?: Yes Type: Manual Max wheelchair distance: 100 ft Assist Level:  Supervision or verbal cues Assist Level: Supervision or verbal cues Wheel 150 feet activity did not occur: Safety/medical concerns Function - Locomotion: Ambulation Assistive device: Orthosis, Walker-rolling Max distance: 130 ft Assist level: Touching or steadying assistance (Pt > 75%) Assist level: Touching or steadying assistance (Pt > 75%) Walk 50 feet with 2 turns activity did not occur: Safety/medical concerns Assist level: Touching or steadying assistance (Pt > 75%) Walk 150 feet activity did not occur: Safety/medical concerns Walk 10 feet on uneven surfaces activity did not occur: Safety/medical concerns  Function - Comprehension Comprehension: Auditory Comprehension assist level: Understands basic 25 - 49% of the time/ requires cueing 50 - 75% of the time  Function - Expression Expression: Verbal Expression assist level: Expresses basic 25 - 49% of the time/requires cueing 50 - 75% of the time. Uses single words/gestures.  Function - Social Interaction Social Interaction assist level: Interacts appropriately 25 - 49% of time - Needs frequent redirection.  Function - Problem Solving Problem solving assist level: Solves basic 25 - 49% of the time - needs direction more than half the time to initiate, plan or complete simple activities  Function - Memory Memory assist level: Recognizes or recalls less than 25% of the time/requires cueing greater than 75% of the time Patient normally able to recall (first 3 days only): None of the above  Medical Problem List and Plan: 1.  Right hemiparesis  and language deficits secondary to left MCA infarct  Cont CIR,  2.  DVT Prophylaxis/Anticoagulation: Pharmaceutical: Xarelto and Plavix, high risk of bleeding, monitor 3. Pain Management: tylenol prn 4. Mood: LCSW to follow for evaluation and support. Sleep/wake cycle  5. Neuropsych: This patient is not capable of making decisions on her own behalf. 6. Skin/Wound Care: Routine pressure  relief measures.  7. Fluids/Electrolytes/Nutrition: Monitor I/Os.  Normal BUN and creatinine on 8/11, continue to monitor intake 8. A fib: Monitor HR bid. Continue amiodarone and metoprolol for HR control.   9. Hypokalemia: Resolved  K+ 4.2 on 8/11  Cont to monitor 10. ABLA: . Monitor for signs of bleeding.   Hgb  10.1 on 8/6 13. Multiple Sclerosis: No medications? 14. H/o Depression: On Effexor and prozac.  57. H/o CAD s/p PTCA: On Lipitor  16. Lethargy: Overall improved, now mainly due to effects of CVA  17. Labile blood pressure   Remained labile, but overall controlled on 8/12 Vitals:   03/12/17 1954 03/13/17 0541  BP: (!) 124/51 137/62  Pulse: 60 (!) 51  Resp:  18  Temp:  98.3 F (36.8 C)  SpO2:  100%   18. Hypoalbuminemia  Supplement initiated 7/28    LOS (Days) 19 A FACE TO FACE EVALUATION WAS PERFORMED   Lorie Phenix 03/13/2017, 8:18 AM

## 2017-03-13 NOTE — Progress Notes (Signed)
Occupational Therapy Session Note  Patient Details  Name: Kaitlin [name] MRN: [id number] Date of Birth: August 17, 1945  Today's Date: 03/13/2017 OT Individual Time: 1400-1500 OT Individual Time Calculation (min): 60 min    Skilled Therapeutic Interventions/Progress Updates:    1:1. No c/o pain. OT educates on adaptive shoe laces and provides pt with elastic laces. Pt dons L shoes, but requires A to push heel into R shoe. Pt ambulates throughout session with RW with MIN A for balance and VC for RW management and safety awareness. In ADL apartment, pt sorts silverware with forced use of RUE into organizer with min HOH A for motor planning and VC to "open big" when reaching to grasp silverware. OT and husband discuss shower set up in prep for d/c. OT demonstrates safe walk in shower transfer using posterior method and RW. Pt return demonstrates with VC for stepping over with LUE and safety. Exited session with pt seated in w/c with QRB donned, call light in reach, husband present and all needs met.  Therapy Documentation Precautions:  Precautions Precautions: Fall Precaution Comments: R side inattention; impulsive Restrictions Weight Bearing Restrictions: No  See Function Navigator for Current Functional Status.   Therapy/Group: Individual Therapy  Tonny Branch 03/13/2017, 5:25 PM

## 2017-03-14 ENCOUNTER — Inpatient Hospital Stay (HOSPITAL_COMMUNITY): Payer: Self-pay | Admitting: Occupational Therapy

## 2017-03-14 ENCOUNTER — Inpatient Hospital Stay (HOSPITAL_COMMUNITY): Payer: Medicare Other | Admitting: Speech Pathology

## 2017-03-14 ENCOUNTER — Inpatient Hospital Stay (HOSPITAL_COMMUNITY): Payer: Self-pay | Admitting: Physical Therapy

## 2017-03-14 NOTE — Progress Notes (Signed)
Speech Language Pathology Daily Session Note  Patient Details  Name: Kaitlin [name] MRN: [id number] Date of Birth: 03-05-1946  Today's Date: 03/14/2017 SLP Individual Time: 1030-1100 SLP Individual Time Calculation (min): 30 min  Short Term Goals: Week 3: SLP Short Term Goal 1 (Week 3): Pt will utilize word finding strategies to name common objects with 50% accuracy and Mod A cues.  SLP Short Term Goal 2 (Week 3): Pt will sustain attention to basic functional task for ~ 30 minutes with Mod A. SLP Short Term Goal 3 (Week 3): Pt will complete basic familiar task during ADLs with Mod A cues.  SLP Short Term Goal 4 (Week 3): Pt will utilize speech intelligibility strategies to achieve ~ 75% intelligibility at the word level with Mod A cues.   Skilled Therapeutic Interventions: Skilled treatment session focused on cognition goals. SLP facilitated session by providing Mod A to complete game of checkers. Pt required Mod A to scan of right side of board and to complete obvious moves within game. Pt with little spoken language. Pt was returned to room, husband present for session and education provided on cognitive deficits. Continue per current plan of care.      Function:    Cognition Comprehension Comprehension assist level: Understands basic 50 - 74% of the time/ requires cueing 25 - 49% of the time  Expression   Expression assist level: Expresses basic 50 - 74% of the time/requires cueing 25 - 49% of the time. Needs to repeat parts of sentences.;Expresses basic 25 - 49% of the time/requires cueing 50 - 75% of the time. Uses single words/gestures.  Social Interaction Social Interaction assist level: Interacts appropriately 50 - 74% of the time - May be physically or verbally inappropriate.;Interacts appropriately 25 - 49% of time - Needs frequent redirection.  Problem Solving Problem solving assist level: Solves basic 50 - 74% of the time/requires cueing 25 - 49% of the time  Memory Memory  assist level: Recognizes or recalls 50 - 74% of the time/requires cueing 25 - 49% of the time    Pain Pain Assessment Pain Assessment: No/denies pain  Therapy/Group: Individual Therapy  Claudie Rathbone 03/14/2017, 12:44 PM

## 2017-03-14 NOTE — Progress Notes (Signed)
Physical Therapy Session Note  Patient Details  Name: Kaitlin [name] MRN: [id number] Date of Birth: 1945-12-22  Today's Date: 03/14/2017 PT Individual Time: 1100-1200 PT Individual Time Calculation (min): 60 min   Short Term Goals: Week 3:  PT Short Term Goal 1 (Week 3): STG =LTG due to ELOS  Skilled Therapeutic Interventions/Progress Updates: Pt presented in w/c with husband present agreeable to therapy. Pt propelled to rehab gym with supervision, additional time, and intermittent cues. Performed balance activities on [employer], no UE with min challenges pt able to improve ankle strategy to maintain fair balance and recovery correction with min cues however increased R lean noted with fatigue.  Played x 2 games of connect 4 on [employer] with intermittent single UE support. Pt required cues approx 50% of time to complete task. Performed step ups at stairs with 2 rails x10 for forced use of RLE. Pt able to clear RLE on step and with verbal cues lift foot from step without dragging. Gait training weaving through cones with mod cues for R foot placement and staying inside RW for safety. Provided family ed to husband on monitoring RLE during turns to avoid scissoring step. Ambulated additional 168f returning to room with RW and minA. Pt returned to w/c and left with QRB and half lap tray in place with husband present and needs met.      Therapy Documentation Precautions:  Precautions Precautions: Fall Precaution Comments: R side inattention; impulsive Restrictions Weight Bearing Restrictions: No General:   Vital Signs:  Pain: Pain Assessment Pain Assessment: No/denies pain   See Function Navigator for Current Functional Status.   Therapy/Group: Individual Therapy  Keelin Sheridan  Karna Abed, PTA  03/14/2017, 12:08 PM

## 2017-03-14 NOTE — Progress Notes (Signed)
Subjective/Complaints: Up on toilet. No new complaints. Had family visiting yesterday  ROS: pt denies nausea, vomiting, diarrhea, cough, shortness of breath or chest pain   Objective: Vital Signs: Blood pressure (!) 167/56, pulse 64, temperature 98.6 F (37 C), temperature source Oral, resp. rate 18, weight 53.5 kg (118 lb), SpO2 98 %. No results found. Results for orders placed or performed during the hospital encounter of 02/22/17 (from the past 72 hour(s))  Basic metabolic panel     Status: None   Collection Time: 03/12/17  5:49 AM  Result Value Ref Range   Sodium 138 135 - 145 mmol/L   Potassium 4.2 3.5 - 5.1 mmol/L   Chloride 107 101 - 111 mmol/L   CO2 24 22 - 32 mmol/L   Glucose, Bld 98 65 - 99 mg/dL   BUN 16 6 - 20 mg/dL   Creatinine, Ser 0.73 0.44 - 1.00 mg/dL   Calcium 8.9 8.9 - 10.3 mg/dL   GFR calc non Af Amer >60 >60 mL/min   GFR calc Af Amer >60 >60 mL/min    Comment: (NOTE) The eGFR has been calculated using the CKD EPI equation. This calculation has not been validated in all clinical situations. eGFR's persistently <60 mL/min signify possible Chronic Kidney Disease.    Anion gap 7 5 - 15     HEENT: Normocephalic, atraumatic. Cardio: RRR without murmur. No JVD  Pulm: CTA Bilaterally without wheezes or rales. Normal effort  Neuro: alert  Motor: 3-/5 RUE   2-/5 RLE   Left upper extremity. Left lower extremity 4+/5 Dysarthria Musc/Skel:  No tenderness, no edema Gen NAD. Vital signs reivewed   Assessment/Plan: 1. Functional deficits secondary to Left MCA infarct which require 3+ hours per day of interdisciplinary therapy in a comprehensive inpatient rehab setting. Physiatrist is providing close team supervision and 24 hour management of active medical problems listed below. Physiatrist and rehab team continue to assess barriers to discharge/monitor patient progress toward functional and medical goals. FIM: Function - Bathing Position: Shower Body parts  bathed by patient: Chest, Abdomen, Left upper leg, Front perineal area, Right arm, Left arm, Right upper leg, Buttocks Body parts bathed by helper: Right lower leg, Left lower leg, Back Assist Level: Touching or steadying assistance(Pt > 75%)  Function- Upper Body Dressing/Undressing What is the patient wearing?: Bra, Pull over shirt/dress Bra - Perfomed by patient: Thread/unthread right bra strap, Thread/unthread left bra strap Bra - Perfomed by helper: Hook/unhook bra (pull down sports bra) Pull over shirt/dress - Perfomed by patient: Thread/unthread left sleeve, Put head through opening, Thread/unthread right sleeve Pull over shirt/dress - Perfomed by helper: Pull shirt over trunk Assist Level: Touching or steadying assistance(Pt > 75%), 2 helpers Function - Lower Body Dressing/Undressing What is the patient wearing?: Pants, Socks, Shoes, Underwear Position: Education officer, museum at [employer] - Performed by patient: Thread/unthread right underwear leg, Thread/unthread left underwear leg, Pull underwear up/down Pants- Performed by patient: Thread/unthread right pants leg, Thread/unthread left pants leg Pants- Performed by helper: Pull pants up/down Non-skid slipper socks- Performed by patient: Don/doff right sock, Don/doff left sock Non-skid slipper socks- Performed by helper: Don/doff right sock, Don/doff left sock Socks - Performed by patient: Don/doff right sock, Don/doff left sock Socks - Performed by helper: Don/doff right sock, Don/doff left sock Shoes - Performed by patient: Fasten right, Fasten left, Don/doff left shoe (elastic laces) Shoes - Performed by helper: Don/doff right shoe AFO - Performed by helper: Don/doff right AFO Assist for footwear: Partial/moderate assist Assist for  lower body dressing: Touching or steadying assistance (Pt > 75%)  Function - Toileting Toileting activity did not occur: No continent bowel/bladder event Toileting steps completed by patient:  Performs perineal hygiene, Adjust clothing prior to toileting Toileting steps completed by helper: Adjust clothing prior to toileting, Performs perineal hygiene, Adjust clothing after toileting Toileting Assistive Devices: Grab bar or rail Assist level: Touching or steadying assistance (Pt.75%)  Function - Toilet Transfers Toilet transfer assistive device: Grab bar Assist level to toilet: Touching or steadying assistance (Pt > 75%) Assist level from toilet: Touching or steadying assistance (Pt > 75%) Assist level to bedside commode (at bedside): Touching or steadying assistance (Pt > 75%) Assist level from bedside commode (at bedside): Touching or steadying assistance (Pt > 75%)  Function - Chair/bed transfer Chair/bed transfer method: Stand pivot Chair/bed transfer assist level: Touching or steadying assistance (Pt > 75%) Chair/bed transfer assistive device: Armrests Chair/bed transfer details: Manual facilitation for weight shifting, Tactile cues for placement, Tactile cues for initiation, Verbal cues for sequencing, Verbal cues for precautions/safety, Verbal cues for safe use of DME/AE  Function - Locomotion: Wheelchair Will patient use wheelchair at discharge?: Yes Type: Manual Max wheelchair distance: 100 ft Assist Level: Supervision or verbal cues Assist Level: Supervision or verbal cues Wheel 150 feet activity did not occur: Safety/medical concerns Function - Locomotion: Ambulation Assistive device: Orthosis, Walker-rolling Max distance: 130 ft Assist level: Touching or steadying assistance (Pt > 75%) Assist level: Touching or steadying assistance (Pt > 75%) Walk 50 feet with 2 turns activity did not occur: Safety/medical concerns Assist level: Touching or steadying assistance (Pt > 75%) Walk 150 feet activity did not occur: Safety/medical concerns Walk 10 feet on uneven surfaces activity did not occur: Safety/medical concerns  Function - Comprehension Comprehension:  Auditory Comprehension assist level: Understands basic 50 - 74% of the time/ requires cueing 25 - 49% of the time  Function - Expression Expression: Verbal Expression assist level: Expresses basic 50 - 74% of the time/requires cueing 25 - 49% of the time. Needs to repeat parts of sentences.  Function - Social Interaction Social Interaction assist level: Interacts appropriately 50 - 74% of the time - May be physically or verbally inappropriate.  Function - Problem Solving Problem solving assist level: Solves basic 50 - 74% of the time/requires cueing 25 - 49% of the time  Function - Memory Memory assist level: Recognizes or recalls 50 - 74% of the time/requires cueing 25 - 49% of the time Patient normally able to recall (first 3 days only): Staff names and faces (recognize faces)  Medical Problem List and Plan: 1.  Right hemiparesis and language deficits secondary to left MCA infarct  Cont CIR,  2.  DVT Prophylaxis/Anticoagulation: Pharmaceutical: Xarelto and Plavix, high risk of bleeding, monitor 3. Pain Management: tylenol prn 4. Mood: LCSW to follow for evaluation and support. Sleep/wake cycle  5. Neuropsych: This patient is not capable of making decisions on her own behalf. 6. Skin/Wound Care: Routine pressure relief measures.  7. Fluids/Electrolytes/Nutrition: Monitor I/Os.  Normal BUN and creatinine on 8/11, continue to monitor intake 8. A fib: Monitor HR bid. Continue amiodarone and metoprolol for HR control.   9. Hypokalemia: Resolved  K+ 4.2 on 8/11  Cont to monitor 10. ABLA: . Monitor for signs of bleeding.   Hgb  10.1 on 8/6 13. Multiple Sclerosis: No medications? 14. H/o Depression: On Effexor and prozac.  81. H/o CAD s/p PTCA: On Lipitor  16. Lethargy: Overall improved, now mainly due to  effects of CVA  17. Labile blood pressure   Remained labile, but overall controlled on 8/13 Vitals:   03/13/17 1942 03/14/17 0544  BP: (!) 159/46 (!) 167/56  Pulse: (!) 55 64   Resp:  18  Temp:  98.6 F (37 C)  SpO2:  98%   18. Hypoalbuminemia  Supplement initiated 7/28    LOS (Days) 20 A FACE TO FACE EVALUATION WAS PERFORMED  Kaitlin [name] T 03/14/2017, 8:58 AM

## 2017-03-14 NOTE — Progress Notes (Signed)
Occupational Therapy Session Note  Patient Details  Name: Kaitlin [name] MRN: [id number] Date of Birth: [birth year]  Today's Date: 03/14/2017 OT Individual Time: 9774-1423 and 1300-1330 OT Individual Time Calculation (min): 75 min and 30 min   Short Term Goals: Week 3:  OT Short Term Goal 1 (Week 3): STG=LTG due to LOS  Skilled Therapeutic Interventions/Progress Updates:    Session One: Pt seen for OT ADL bathing/dressing session. Pt sitting up in bed with husband present. With encouragement from husband, pt agreeable to showering task.  She ambulated throughout room with RW and min A for R hand on RW and for management of RW in functional context. Toileting task completed with steadying assist.  She ambulated into walk in shower and bathed seated on 3-1 BSC with intermittent steadying assist and VCs for sequencing.  She returned to w/c to dress, able to follow VCs for hemi dressing technique and min A for clothing management and steadying assist when standing.  Grooming tasks completed from w/c level at sink with supervision/ VCs for locating items and sequencing. Pt left seated in w/c finishing grooming tasks at sink with husband present.  Pt's husband educated throughout session regarding d/c planing, how to assist pt, reducing caregiver burden, and RW management.   Session Two: Pt seen for OT session focusing on caregiver training with pt's husband. Pt sitting up in w/c upon arrival with husband present, agreeable to session with hands on education. Pt's husband ambulated with pt into bathroom and assisted with toileting task. Pt's husband provided overall min A with VCs and assist for RW management. Education provided regarding hand placement when providing min A, appropriate cuing for pt, and pt's deficits and resulting need for assist.  She completed grooming tasks standing at sink with CGA- close supervision when leaning into sink, requiring VCs for sequencing of grooming tasks.   Following seated rest break, pt and her husband ambulated to family room, completing sit <> stand from low soft surface couch in simulation of home enviornment. Throughout session, pt attempting to sit prematurely and requiring max A to re-position in order to prevent fall. Pt returned to room at end of session, left seated in w/c with husband present. Pt's husband voiced feeling more comfortable providing assist, however, in agreement more hands on assist would be beneficial prior to d/c. D/c planning discussed throughout session.  Therapy Documentation Precautions:  Precautions Precautions: Fall Precaution Comments: R side inattention; impulsive Restrictions Weight Bearing Restrictions: No Pain:   No/ denies pain  See Function Navigator for Current Functional Status.   Therapy/Group: Individual Therapy  Lewis, Jahlen Bollman C 03/14/2017, 7:09 AM

## 2017-03-15 ENCOUNTER — Inpatient Hospital Stay (HOSPITAL_COMMUNITY): Payer: Self-pay | Admitting: Occupational Therapy

## 2017-03-15 ENCOUNTER — Inpatient Hospital Stay (HOSPITAL_COMMUNITY): Payer: Self-pay | Admitting: Physical Therapy

## 2017-03-15 ENCOUNTER — Inpatient Hospital Stay (HOSPITAL_COMMUNITY): Payer: Medicare Other | Admitting: Speech Pathology

## 2017-03-15 LAB — BASIC METABOLIC PANEL
ANION GAP: 8 (ref 5–15)
BUN: 19 mg/dL (ref 6–20)
CALCIUM: 8.5 mg/dL — AB (ref 8.9–10.3)
CO2: 23 mmol/L (ref 22–32)
CREATININE: 0.8 mg/dL (ref 0.44–1.00)
Chloride: 106 mmol/L (ref 101–111)
GFR calc Af Amer: >60 mL/min (ref >60)
GLUCOSE: 103 mg/dL — AB (ref 65–99)
Potassium: 4.7 mmol/L (ref 3.5–5.1)
Sodium: 137 mmol/L (ref 135–145)

## 2017-03-15 LAB — CBC
HCT: 28.4 % — ABNORMAL LOW (ref 36.0–46.0)
HEMOGLOBIN: 9.6 g/dL — AB (ref 12.0–15.0)
MCH: 32 pg (ref 26.0–34.0)
MCHC: 33.8 g/dL (ref 30.0–36.0)
MCV: 94.7 fL (ref 78.0–100.0)
PLATELETS: 247 10*3/uL (ref 150–400)
RBC: 3 MIL/uL — ABNORMAL LOW (ref 3.87–5.11)
RDW: 14.5 % (ref 11.5–15.5)
WBC: 7.2 10*3/uL (ref 4.0–10.5)

## 2017-03-15 NOTE — Progress Notes (Signed)
Occupational Therapy Session Note  Patient Details  Name: Kaitlin [name] MRN: [id number] Date of Birth: 12-12-1945  Today's Date: 03/15/2017 OT Individual Time: 7829-5621 OT Individual Time Calculation (min): 75 min    Short Term Goals: Week 3:  OT Short Term Goal 1 (Week 3): STG=LTG due to LOS  Skilled Therapeutic Interventions/Progress Updates:    Pt seen for OT session focusing on ADL re-training and cognitive remediation with orientation. Pt sitting up in w/c upon arrival, agreeable to tx session and desiring to eat breakfast.  Transported in w/c to therapy day room for breakfast. Pt required max A for word finding to identify items on breakfast tray, able to use meal ticket with max cuing to assist with identification. With VCs for encouragement, used R UE at gross assist level with decreased coordination and movements observed. Pt oriented to city and person only. Not oriented to place, time or situation. With mod questioning questions and given option of 3, pt able to correctly identify month and year, however, was unable to immediately correctly recall independently.  Pt returned to room in w/c. She ambulated with RW from room to bathroom with min A and assist for RW management during turns.  She dressed seated on toilet, multimodal cuing for adhering to hemi dressing techniques and overall min A for steadying during dynamic sitting and standing tasks. She returnred to sink and completed grooming tasks standing at sink with steadying assist. Pt able to use B UEs simultaneously in functional task with R UE at gross stabilizer level mod I to complete functional tasks. Pt left seated in w/c at end of session, QRB donned and all needs in reach.   Therapy Documentation Precautions:  Precautions Precautions: Fall Precaution Comments: R side inattention; impulsive Restrictions Weight Bearing Restrictions: No Pain:   No/ denies pain  See Function Navigator for Current Functional  Status.   Therapy/Group: Individual Therapy  Lewis, Kaelei Wheeler C 03/15/2017, 6:59 AM

## 2017-03-15 NOTE — Progress Notes (Signed)
Subjective/Complaints: Up in w/c. No new complaints. About to go to BR with OT.   ROS: pt denies nausea, vomiting, diarrhea, cough, shortness of breath or chest pain   Objective: Vital Signs: Blood pressure (!) 188/48, pulse (!) 59, temperature 98.3 F (36.8 C), temperature source Oral, resp. rate 12, weight 53.5 kg (118 lb), SpO2 95 %. No results found. Results for orders placed or performed during the hospital encounter of 02/22/17 (from the past 72 hour(s))  Basic metabolic panel     Status: Abnormal   Collection Time: 03/15/17  4:59 AM  Result Value Ref Range   Sodium 137 135 - 145 mmol/L   Potassium 4.7 3.5 - 5.1 mmol/L   Chloride 106 101 - 111 mmol/L   CO2 23 22 - 32 mmol/L   Glucose, Bld 103 (H) 65 - 99 mg/dL   BUN 19 6 - 20 mg/dL   Creatinine, Ser 0.80 0.44 - 1.00 mg/dL   Calcium 8.5 (L) 8.9 - 10.3 mg/dL   GFR calc non Af Amer >60 >60 mL/min   GFR calc Af Amer >60 >60 mL/min    Comment: (NOTE) The eGFR has been calculated using the CKD EPI equation. This calculation has not been validated in all clinical situations. eGFR's persistently <60 mL/min signify possible Chronic Kidney Disease.    Anion gap 8 5 - 15  CBC     Status: Abnormal   Collection Time: 03/15/17  4:59 AM  Result Value Ref Range   WBC 7.2 4.0 - 10.5 K/uL   RBC 3.00 (L) 3.87 - 5.11 MIL/uL   Hemoglobin 9.6 (L) 12.0 - 15.0 g/dL   HCT 28.4 (L) 36.0 - 46.0 %   MCV 94.7 78.0 - 100.0 fL   MCH 32.0 26.0 - 34.0 pg   MCHC 33.8 30.0 - 36.0 g/dL   RDW 14.5 11.5 - 15.5 %   Platelets 247 150 - 400 K/uL     HEENT: Normocephalic, atraumatic. Cardio: RRR without murmur. No JVD   Pulm: CTA Bilaterally without wheezes or rales. Normal effort   Neuro: alert  Motor: 3-/5 RUE--stable   2-/5 RLE-stable Left upper extremity. Left lower extremity 4+/5 Dysarthria Musc/Skel:  No tenderness, no edema Gen NAD. Vital signs reivewed   Assessment/Plan: 1. Functional deficits secondary to Left MCA infarct which require  3+ hours per day of interdisciplinary therapy in a comprehensive inpatient rehab setting. Physiatrist is providing close team supervision and 24 hour management of active medical problems listed below. Physiatrist and rehab team continue to assess barriers to discharge/monitor patient progress toward functional and medical goals. FIM: Function - Bathing Position: Shower Body parts bathed by patient: Chest, Abdomen, Left upper leg, Front perineal area, Right arm, Left arm, Right upper leg, Buttocks Body parts bathed by helper: Right lower leg, Left lower leg, Back Assist Level: Touching or steadying assistance(Pt > 75%)  Function- Upper Body Dressing/Undressing What is the patient wearing?: Bra, Pull over shirt/dress Bra - Perfomed by patient: Thread/unthread right bra strap, Thread/unthread left bra strap Bra - Perfomed by helper: Hook/unhook bra (pull down sports bra) Pull over shirt/dress - Perfomed by patient: Thread/unthread left sleeve, Put head through opening, Thread/unthread right sleeve Pull over shirt/dress - Perfomed by helper: Pull shirt over trunk Assist Level: Supervision or verbal cues Function - Lower Body Dressing/Undressing What is the patient wearing?: Pants, Socks, Shoes, Underwear Position: Wheelchair/chair at [employer] - Performed by patient: Thread/unthread right underwear leg, Thread/unthread left underwear leg, Pull underwear up/down Pants- Performed by  patient: Thread/unthread right pants leg, Thread/unthread left pants leg, Pull pants up/down Pants- Performed by helper: Pull pants up/down Non-skid slipper socks- Performed by patient: Don/doff right sock, Don/doff left sock Non-skid slipper socks- Performed by helper: Don/doff right sock, Don/doff left sock Socks - Performed by patient: Don/doff right sock, Don/doff left sock Socks - Performed by helper: Don/doff right sock, Don/doff left sock Shoes - Performed by patient: Don/doff right shoe Shoes -  Performed by helper: Don/doff right shoe AFO - Performed by helper: Don/doff right AFO Assist for footwear: Partial/moderate assist Assist for lower body dressing: Touching or steadying assistance (Pt > 75%)  Function - Toileting Toileting activity did not occur: No continent bowel/bladder event Toileting steps completed by patient: Performs perineal hygiene, Adjust clothing prior to toileting Toileting steps completed by helper: Adjust clothing prior to toileting, Performs perineal hygiene, Adjust clothing after toileting Toileting Assistive Devices: Grab bar or rail Assist level: Touching or steadying assistance (Pt.75%)  Function - Air cabin crew transfer assistive device: Grab bar Assist level to toilet: Touching or steadying assistance (Pt > 75%) Assist level from toilet: Touching or steadying assistance (Pt > 75%) Assist level to bedside commode (at bedside): Touching or steadying assistance (Pt > 75%) Assist level from bedside commode (at bedside): Touching or steadying assistance (Pt > 75%)  Function - Chair/bed transfer Chair/bed transfer method: Stand pivot Chair/bed transfer assist level: Touching or steadying assistance (Pt > 75%) Chair/bed transfer assistive device: Armrests Chair/bed transfer details: Manual facilitation for weight shifting, Tactile cues for placement, Tactile cues for initiation, Verbal cues for sequencing, Verbal cues for precautions/safety, Verbal cues for safe use of DME/AE  Function - Locomotion: Wheelchair Will patient use wheelchair at discharge?: Yes Type: Manual Max wheelchair distance: 100 ft Assist Level: Supervision or verbal cues Assist Level: Supervision or verbal cues Wheel 150 feet activity did not occur: Safety/medical concerns Function - Locomotion: Ambulation Assistive device: Orthosis, Walker-rolling Max distance: 130 ft Assist level: Touching or steadying assistance (Pt > 75%) Assist level: Touching or steadying  assistance (Pt > 75%) Walk 50 feet with 2 turns activity did not occur: Safety/medical concerns Assist level: Touching or steadying assistance (Pt > 75%) Walk 150 feet activity did not occur: Safety/medical concerns Walk 10 feet on uneven surfaces activity did not occur: Safety/medical concerns  Function - Comprehension Comprehension: Auditory Comprehension assist level: Understands basic 50 - 74% of the time/ requires cueing 25 - 49% of the time  Function - Expression Expression: Verbal Expression assist level: Expresses basic 50 - 74% of the time/requires cueing 25 - 49% of the time. Needs to repeat parts of sentences.  Function - Social Interaction Social Interaction assist level: Interacts appropriately 50 - 74% of the time - May be physically or verbally inappropriate.  Function - Problem Solving Problem solving assist level: Solves basic 50 - 74% of the time/requires cueing 25 - 49% of the time  Function - Memory Memory assist level: Recognizes or recalls 50 - 74% of the time/requires cueing 25 - 49% of the time Patient normally able to recall (first 3 days only): Staff names and faces (recognize faces)  Medical Problem List and Plan: 1.  Right hemiparesis and language deficits secondary to left MCA infarct  Cont CIR,  2.  DVT Prophylaxis/Anticoagulation: Pharmaceutical: Xarelto and Plavix, high risk of bleeding, monitor 3. Pain Management: tylenol prn 4. Mood: LCSW to follow for evaluation and support. Sleep/wake cycle  5. Neuropsych: This patient is not capable of making decisions on her  own behalf. 6. Skin/Wound Care: Routine pressure relief measures.  7. Fluids/Electrolytes/Nutrition: Monitor I/Os.  Normal BUN and creatinine on 8/14, continue to encourage fluids 8. A fib: Monitor HR bid. Continue amiodarone and metoprolol for HR control.   9. Hypokalemia: Resolved  K+ 4.7 on 8/14    10. ABLA: . Monitor for signs of bleeding.   Hgb  10.1 on 8/6 13. Multiple Sclerosis:  No medications? 14. H/o Depression: On Effexor and prozac.  53. H/o CAD s/p PTCA: On Lipitor  16. Lethargy: Overall improved, now mainly due to effects of CVA  17. Labile blood pressure   Showing general improvement until this morning   -follow for further pattern  Vitals:   03/14/17 2047 03/15/17 0425  BP: (!) 165/51 (!) 188/48  Pulse: 60 (!) 59  Resp:  12  Temp:  98.3 F (36.8 C)  SpO2:  95%   18. Hypoalbuminemia  Supplement initiated 7/28    LOS (Days) 21 A FACE TO FACE EVALUATION WAS PERFORMED  Daaiel Starlin T 03/15/2017, 8:58 AM

## 2017-03-15 NOTE — Progress Notes (Signed)
Speech Language Pathology Discharge Summary  Patient Details  Name: Kaitlin [name] MRN: [id number] Date of Birth: August 28, 1945  Today's Date: 03/15/2017 SLP Individual Time: 1115-1200 SLP Individual Time Calculation (min): 45 min   Skilled Therapeutic Interventions:  Skilled treatment session focused on completion of caregiver education. All questions answered to husband's satisfaction.     Patient has met 10 of 10 long term goals.  Patient to discharge at overall Mod;Max level.    Clinical Impression/Discharge Summary:   Pt with progress towards goals and as met all of her LTGs at a Max to Mod level of support. Extensive education provided to husband on level of care required and her overall inability to express herself. Pt requires HHST to further her expressive abilities. Pt's husband voiced understanding.   Care Partner:  Caregiver Able to Provide Assistance: Yes  Type of Caregiver Assistance: Cognitive;Physical  Recommendation:  Home Health SLP;24 hour supervision/assistance  Rationale for SLP Follow Up: Maximize functional communication;Maximize cognitive function and independence;Reduce caregiver burden   Equipment:     Reasons for discharge: Treatment goals met   Patient/Family Agrees with Progress Made and Goals Achieved: Yes   Function:   Cognition Comprehension Comprehension assist level: Understands basic 75 - 89% of the time/ requires cueing 10 - 24% of the time;Understands basic 50 - 74% of the time/ requires cueing 25 - 49% of the time  Expression   Expression assist level: Expresses basic 50 - 74% of the time/requires cueing 25 - 49% of the time. Needs to repeat parts of sentences.  Social Interaction Social Interaction assist level: Interacts appropriately 50 - 74% of the time - May be physically or verbally inappropriate.  Problem Solving Problem solving assist level: Solves basic 50 - 74% of the time/requires cueing 25 - 49% of the time  Memory Memory assist  level: Recognizes or recalls 25 - 49% of the time/requires cueing 50 - 75% of the time   Mashayla Lavin 03/15/2017, 12:58 PM

## 2017-03-15 NOTE — Progress Notes (Signed)
Physical Therapy Session Note  Patient Details  Name: Kaitlin [name] MRN: [id number] Date of Birth: 14-Apr-1946  Today's Date: 03/15/2017 PT Individual Time: 0947-1100 PT Individual Time Calculation (min): 73 min   Short Term Goals: Week 3:  PT Short Term Goal 1 (Week 3): STG =LTG due to ELOS  Skilled Therapeutic Interventions/Progress Updates: Pt presented in chair agreeable to therapy. Pt transported to ortho gym for time management. Pt performed car transfer minA with single step cues. Pt able to lift leg and place leg into car without assistance. Pt performed 8in step up with RW minA with single step cues. Pt performed x 2 with PTA and performed family edu with pt's husband practicing with pt x 2. Transported outside and performed gait training on uneven surface. Pt ambulated 133f with minA and RW. Performed stair training, ascending/descending x 8 steps B rails min/modA. Performed minA ascending modA descending. Pt demonstrating improved eccentric control descending stairs. Practiced side stepping with [facility] Pt sidestepped L/R 273f1 minA with cues to remain within RW. Pt returned to room and remained in w/c at end of session with needs met.      Therapy Documentation Precautions:  Precautions Precautions: Fall Precaution Comments: R side inattention; impulsive Restrictions Weight Bearing Restrictions: No General:   Vital Signs:  Pain: Pain Assessment Pain Assessment: No/denies pain Mobility:   Locomotion :    Trunk/Postural Assessment :    Balance:   Exercises:   Other Treatments:     See Function Navigator for Current Functional Status.   Therapy/Group: Individual Therapy  [name] 03/15/2017, 12:48 PM

## 2017-03-15 NOTE — Progress Notes (Signed)
Orthopedic Tech Progress Note Patient Details:  [name] 30-Dec-1945 [id number]  Patient ID: Kaitlin [name], female   DOB: [birth year], 71 y.o.   MRN: [id number]   Kaitlin [name] 03/15/2017, 11:48 AM Called in advanced brace order; spoke with [facility]

## 2017-03-16 ENCOUNTER — Ambulatory Visit (HOSPITAL_COMMUNITY): Payer: Self-pay | Admitting: Physical Therapy

## 2017-03-16 ENCOUNTER — Inpatient Hospital Stay (HOSPITAL_COMMUNITY): Payer: Self-pay | Admitting: Occupational Therapy

## 2017-03-16 ENCOUNTER — Inpatient Hospital Stay (HOSPITAL_COMMUNITY): Payer: Self-pay | Admitting: Physical Therapy

## 2017-03-16 NOTE — Progress Notes (Signed)
Nutrition Follow-up  DOCUMENTATION CODES:   Not applicable  INTERVENTION:  Recommend continuation of nutritional supplements post discharge especially if po intake is poor.   Continue 30 ml Prostat po BID, each supplement provides 100 kcal and 15 grams of protein.   NUTRITION DIAGNOSIS:   Increased nutrient needs related to  (therapy) as evidenced by estimated needs; ongoing  GOAL:   Patient will meet greater than or equal to 90% of their needs; met  MONITOR:   PO intake, Supplement acceptance, Labs, Weight trends, Skin, I & O's, Diet advancement  REASON FOR ASSESSMENT:   Malnutrition Screening Tool    ASSESSMENT:   71 y.o. female with history of CAD s/p PTCA, MS with gait disorder/memory loss, PAF who was admitted on 02/14/17 with reports of speech difficulty progressing to right sided weakness. CTA  head showed evidence of infarct left insular and left posterior putamen with  occlusion of L-CCA and L-ICA from arch to distal cavernous segment and incidental finding of centrilobar emphysema in lung apices. She underwent cerebral angio with complete revascularization of occluded L-MCA with one pass Solitaire and stent assisted angioplasty of proximal L-ICA with IA integrelin. She was started on dysphagia 2, thin liquids due to mild oropharyngeal dysphagia with stasis.  Meal completion has been varied from 35-100% with most intake at 50-100%. Pt currently has Prostat ordered and has been consuming them. RD to continue with current orders to aid in adequate nutrition. Labs and medications reviewed. Plans for discharge 8/18. Recommend continuation of nutritional supplements post discharge especially if po intake is poor.   Diet Order:  Diet regular Room service appropriate? Yes; Fluid consistency: Thin  Skin:  Reviewed, no issues  Last BM:  8/11  Height:   Ht Readings from Last 1 Encounters:  02/22/17 5' 2"  (1.575 m)    Weight:   Wt Readings from Last 1 Encounters:   03/06/17 118 lb (53.5 kg)    Ideal Body Weight:  50 kg  BMI:  Body mass index is 21.58 kg/m.  Estimated Nutritional Needs:   Kcal:  1550-1750  Protein:  60-70 grams  Fluid:  >/= 1.5 L/day  EDUCATION NEEDS:   No education needs identified at this time  Corrin Parker, MS, RD, LDN Pager # [phone] After hours/ weekend pager # [phone]

## 2017-03-16 NOTE — Progress Notes (Signed)
Occupational Therapy Session Note  Patient Details  Name: Kaitlin [name] MRN: [id number] Date of Birth: 28-Mar-1946  Today's Date: 03/16/2017 OT Individual Time: 4098-1191 and 1300-1345 OT Individual Time Calculation (min): 60 min and 45 min   Short Term Goals: Week 3:  OT Short Term Goal 1 (Week 3): STG=LTG due to LOS  Skilled Therapeutic Interventions/Progress Updates:    Session One: Pt seen for OT session focusing on ADL re-training. Pt asleep in supine upon arrival, easily awoken and requesting to eat breakfast.  First, pt agreeable to toileting task. She ambulated throughout room with min A and VCs for RW management. Completed toileting task with min-mod A for steadying, able to complete 3/3 toileting tasks. Grooming tasks completed standing at sink. Pt with stronger R lean in standing today compared to previous sessions, requiring mod-max A for standing balance with VCs for use of mirror for visual feedback of midline orientation She ate breakfast seated EOB, able to correctly name 1/3 breakfast items with mod-max A for word finding.  Min A required to use R UE at gross assist level to stabilzie items while opening containers with L hand and for picking up pieces of bacon with R hand, decreased control to mouth observed.  Pt oriented to month when asked twice during session. She was oriented to "hospital" one of two times asked, unoriented to year or why she was in hospital. .She donned pants seated EOB with steadying assist with dynamic sitting tasks and standing at RW to pull pants up. Pt left seated in w/c at end of session, QRB donned and all needs in reach.    Session Two: Pt seen for OT ADL bathing/dressing session with emphaiss on hands on family training. Pt sitting up in w/c upon arrival with husband present.With encouragement from husband, pt agreeable to bathing at shower level. Husband provided min A for ambulaion within room and bathroom. Toileting task completed with total A  provided by husband follwon. She bathed seated on 3-1 BSC in shower. Shower stall set-up in simulationof home environment with pt required to step over threashold to entrer, requiring increased steadying assist and VCs for technique. Increased assist required for exit due to pt's positioning on BSC.  She ambulated out to w/c and dressed withincreased assist due to time constraints. Pt left seated in w/c with hand off to PT.  Education and discussion with husband throughout session regarding home bathroom modifications including grab bars and use of BSC over toilet, continuum of care, decreaseing caregiver burden, and d/c planning.   Therapy Documentation Precautions:  Precautions Precautions: Fall Precaution Comments: R side inattention; impulsive Restrictions Weight Bearing Restrictions: No Pain: Pain Assessment Pain Assessment: No/denies pain  See Function Navigator for Current Functional Status.   Therapy/Group: Individual Therapy  Lewis, Muaaz Brau C 03/16/2017, 7:07 AM

## 2017-03-16 NOTE — Progress Notes (Signed)
Subjective/Complaints:  No issues overnite, husband has questions regarding D/C  ROS: pt denies nausea, vomiting, diarrhea, cough, shortness of breath or chest pain   Objective: Vital Signs: Blood pressure (!) 155/53, pulse 62, temperature 98.6 F (37 C), temperature source Oral, resp. rate 18, height 5' 2"  (1.575 m), weight 53.5 kg (118 lb), SpO2 98 %. No results found. Results for orders placed or performed during the hospital encounter of 02/22/17 (from the past 72 hour(s))  Basic metabolic panel     Status: Abnormal   Collection Time: 03/15/17  4:59 AM  Result Value Ref Range   Sodium 137 135 - 145 mmol/L   Potassium 4.7 3.5 - 5.1 mmol/L   Chloride 106 101 - 111 mmol/L   CO2 23 22 - 32 mmol/L   Glucose, Bld 103 (H) 65 - 99 mg/dL   BUN 19 6 - 20 mg/dL   Creatinine, Ser 0.80 0.44 - 1.00 mg/dL   Calcium 8.5 (L) 8.9 - 10.3 mg/dL   GFR calc non Af Amer >60 >60 mL/min   GFR calc Af Amer >60 >60 mL/min    Comment: (NOTE) The eGFR has been calculated using the CKD EPI equation. This calculation has not been validated in all clinical situations. eGFR's persistently <60 mL/min signify possible Chronic Kidney Disease.    Anion gap 8 5 - 15  CBC     Status: Abnormal   Collection Time: 03/15/17  4:59 AM  Result Value Ref Range   WBC 7.2 4.0 - 10.5 K/uL   RBC 3.00 (L) 3.87 - 5.11 MIL/uL   Hemoglobin 9.6 (L) 12.0 - 15.0 g/dL   HCT 28.4 (L) 36.0 - 46.0 %   MCV 94.7 78.0 - 100.0 fL   MCH 32.0 26.0 - 34.0 pg   MCHC 33.8 30.0 - 36.0 g/dL   RDW 14.5 11.5 - 15.5 %   Platelets 247 150 - 400 K/uL     HEENT: Normocephalic, atraumatic. Cardio: RRR without murmur. No JVD   Pulm: CTA Bilaterally without wheezes or rales. Normal effort   Neuro: alert  Motor: 3-/5 RUE--stable   2-/5 RLE-stable Left upper extremity. Left lower extremity 4+/5 Dysarthria Musc/Skel:  No tenderness, no edema Gen NAD. Vital signs reivewed   Assessment/Plan: 1. Functional deficits secondary to Left MCA  infarct which require 3+ hours per day of interdisciplinary therapy in a comprehensive inpatient rehab setting. Physiatrist is providing close team supervision and 24 hour management of active medical problems listed below. Physiatrist and rehab team continue to assess barriers to discharge/monitor patient progress toward functional and medical goals. FIM: Function - Bathing Position: Shower Body parts bathed by patient: Chest, Abdomen, Left upper leg, Front perineal area, Right arm, Left arm, Right upper leg, Buttocks Body parts bathed by helper: Right lower leg, Left lower leg, Back Assist Level: Touching or steadying assistance(Pt > 75%)  Function- Upper Body Dressing/Undressing What is the patient wearing?: Bra, Pull over shirt/dress Bra - Perfomed by patient: Thread/unthread right bra strap, Thread/unthread left bra strap Bra - Perfomed by helper: Hook/unhook bra (pull down sports bra) Pull over shirt/dress - Perfomed by patient: Thread/unthread left sleeve, Put head through opening, Thread/unthread right sleeve, Pull shirt over trunk Pull over shirt/dress - Perfomed by helper: Pull shirt over trunk Assist Level: Supervision or verbal cues Function - Lower Body Dressing/Undressing What is the patient wearing?: Pants, Shoes Position: Sitting EOB Underwear - Performed by patient: Thread/unthread right underwear leg, Thread/unthread left underwear leg, Pull underwear up/down Pants- Performed by  patient: Thread/unthread right pants leg, Thread/unthread left pants leg, Pull pants up/down Pants- Performed by helper: Fasten/unfasten pants Non-skid slipper socks- Performed by patient: Don/doff right sock, Don/doff left sock Non-skid slipper socks- Performed by helper: Don/doff right sock, Don/doff left sock Socks - Performed by patient: Don/doff right sock, Don/doff left sock Socks - Performed by helper: Don/doff right sock, Don/doff left sock Shoes - Performed by patient: Don/doff right  shoe, Don/doff left shoe Shoes - Performed by helper: Don/doff left shoe AFO - Performed by helper: Don/doff right AFO Assist for footwear: Supervision/touching assist, Partial/moderate assist Assist for lower body dressing: Touching or steadying assistance (Pt > 75%)  Function - Toileting Toileting activity did not occur: No continent bowel/bladder event Toileting steps completed by patient: Performs perineal hygiene, Adjust clothing prior to toileting, Adjust clothing after toileting Toileting steps completed by helper: Adjust clothing prior to toileting, Performs perineal hygiene, Adjust clothing after toileting Toileting Assistive Devices: Grab bar or rail Assist level: Touching or steadying assistance (Pt.75%)  Function - Air cabin crew transfer assistive device: Grab bar, Walker Assist level to toilet: Touching or steadying assistance (Pt > 75%) Assist level from toilet: Touching or steadying assistance (Pt > 75%) Assist level to bedside commode (at bedside): Touching or steadying assistance (Pt > 75%) Assist level from bedside commode (at bedside): Touching or steadying assistance (Pt > 75%)  Function - Chair/bed transfer Chair/bed transfer method: Stand pivot Chair/bed transfer assist level: Touching or steadying assistance (Pt > 75%) Chair/bed transfer assistive device: Armrests Chair/bed transfer details: Manual facilitation for weight shifting, Tactile cues for placement, Tactile cues for initiation, Verbal cues for sequencing, Verbal cues for precautions/safety, Verbal cues for safe use of DME/AE  Function - Locomotion: Wheelchair Will patient use wheelchair at discharge?: Yes Type: Manual Max wheelchair distance: 100 ft Assist Level: Supervision or verbal cues Assist Level: Supervision or verbal cues Wheel 150 feet activity did not occur: Safety/medical concerns Function - Locomotion: Ambulation Assistive device: Orthosis, Walker-rolling Max distance: 130  ft Assist level: Touching or steadying assistance (Pt > 75%) Assist level: Touching or steadying assistance (Pt > 75%) Walk 50 feet with 2 turns activity did not occur: Safety/medical concerns Assist level: Touching or steadying assistance (Pt > 75%) Walk 150 feet activity did not occur: Safety/medical concerns Walk 10 feet on uneven surfaces activity did not occur: Safety/medical concerns  Function - Comprehension Comprehension: Auditory Comprehension assist level: Understands basic 75 - 89% of the time/ requires cueing 10 - 24% of the time, Understands basic 50 - 74% of the time/ requires cueing 25 - 49% of the time  Function - Expression Expression: Verbal Expression assist level: Expresses basic 50 - 74% of the time/requires cueing 25 - 49% of the time. Needs to repeat parts of sentences.  Function - Social Interaction Social Interaction assist level: Interacts appropriately 50 - 74% of the time - May be physically or verbally inappropriate.  Function - Problem Solving Problem solving assist level: Solves basic 50 - 74% of the time/requires cueing 25 - 49% of the time  Function - Memory Memory assist level: Recognizes or recalls 25 - 49% of the time/requires cueing 50 - 75% of the time Patient normally able to recall (first 3 days only): None of the above  Medical Problem List and Plan: 1.  Right hemiparesis and language deficits secondary to left MCA infarct  Cont CIR, PT,OT,SLP, Team conference today please see physician documentation under team conference tab, met with team face-to-face to discuss problems,progress, and  goals. Formulized individual treatment plan based on medical history, underlying problem and comorbidities. 2.  DVT Prophylaxis/Anticoagulation: Pharmaceutical: Xarelto and Plavix, high risk of bleeding, monitor 3. Pain Management: tylenol prn 4. Mood: LCSW to follow for evaluation and support. Sleep/wake cycle  5. Neuropsych: This patient is not capable of  making decisions on her own behalf. 6. Skin/Wound Care: Routine pressure relief measures.  7. Fluids/Electrolytes/Nutrition: Monitor I/Os.  Normal BUN and creatinine on 8/14, continue to encourage fluids 8. A fib: Monitor HR bid. Continue amiodarone and metoprolol for HR control.   On Xarelto- f/u Dr Meda Coffee 9. Hypokalemia: Resolved  K+ 4.7 on 8/14    10. ABLA: . Monitor for signs of bleeding.   Hgb  9.6 on 8/6 13. Multiple Sclerosis: No medications? Pt /husband would like to f/u with local neurologist for MS  14. H/o Depression: On Effexor and prozac.  81. H/o CAD s/p PTCA: On Lipitor  16. Lethargy: Overall improved, now mainly due to effects of CVA  17. Labile blood pressure     -am systolic elevation 1/46, monitor Vitals:   03/15/17 2031 03/16/17 0608  BP: (!) 127/46 (!) 155/53  Pulse: (!) 58 62  Resp:  18  Temp:  98.6 F (37 C)  SpO2: 96% 98%   18. Hypoalbuminemia  Supplement initiated 7/28    LOS (Days) 22 A FACE TO FACE EVALUATION WAS PERFORMED  KIRSTEINS,ANDREW E 03/16/2017, 9:45 AM

## 2017-03-16 NOTE — Plan of Care (Signed)
Problem: RH SAFETY Goal: RH STG ADHERE TO SAFETY PRECAUTIONS W/ASSISTANCE/DEVICE STG Adhere to Safety Precautions With mod Assistance/Device.   Outcome: Not Progressing Pt still requiring telesitter

## 2017-03-16 NOTE — Progress Notes (Signed)
Physical Therapy Session Note  Patient Details  Name: Kaitlin [name] MRN: [id number] Date of Birth: 1946/03/31  Today's Date: 03/16/2017 PT Individual Time: 1103-1200 and 1345-1415 PT Individual Time Calculation (min): 57 min and 30 min  Short Term Goals: Week 3:  PT Short Term Goal 1 (Week 3): STG =LTG due to ELOS  Skilled Therapeutic Interventions/Progress Updates: Tx1:  Pt presented in w/c with husband present agreeable to therapy. Focus on functional mobility with family education. Performed w/c mobilty 32f with minA, max verbal cues for object avoidance on R and turns. Performed car transfer with RW from sedan height with minA x 4 (3 times with husband) husband able to provide appropriate verbal/tactile cues for safe transfer. Pt performed 8in step up to landing (to mimic step up to patio). Pt able to perform when provided with single step commands to perform safe transfer. Husband instructed and performed stand pivot transfer between mat and w/c to simulate bed to w/c or [facility]. Husband able to demonstrate with instruction appropriate multimodal cues for safe transfer. Pt returned to room and remained in w/c for lunch with current needs met.   Tx2: Family edu for car transfer. Pt transported to hospital entrance and performed car transfer x 2 with vehicle pt will go home in. Car door unable to open to fit RW safely, however pt and husband able to perform stand pivot transfer with pt holding husband's arms. Pt's husband able to provide safe and appropriate verbal cues on second trial where pt was able transfer with minA. Pt returned to room after transfer and remained in w/c with QRB and half lap tray in place with needs met.     Therapy Documentation Precautions:  Precautions Precautions: Fall Precaution Comments: R side inattention; impulsive Restrictions Weight Bearing Restrictions: No     See Function Navigator for Current Functional Status.   Therapy/Group:  Individual Therapy  Sallye Lunz  Unita Detamore, PTA  03/16/2017, 12:09 PM

## 2017-03-16 NOTE — Patient Care Conference (Signed)
Inpatient RehabilitationTeam Conference and Plan of Care Update Date: 03/17/2017   Time: 10:40 AM    Patient Name: Kaitlin [name]      Medical Record Number: [id number]  Date of Birth: 1946-03-04 Sex: Female         Room/Bed: 4W14C/4W14C-01 Payor Info: Payor: [employer] MEDICARE / Plan: [facility] MEDICARE / Product Type: *No Product type* /    Admitting Diagnosis: L CVA  Admit Date/Time:  02/22/2017  5:03 PM Admission Comments: No comment available   Primary Diagnosis:  <principal problem not specified> Principal Problem: <principal problem not specified>  Patient Active Problem List   Diagnosis Date Noted  . Right hemiparesis (HCC)   . Aspiration pneumonia of right lung (HCC)   . Leukocytosis   . Hypokalemia   . Labile blood pressure   . Abnormality of gait as late effect of stroke 02/23/2017  . Dysarthria as late effect of stroke 02/23/2017  . Dysphagia as late effect of stroke 02/23/2017  . Acute ischemic left middle cerebral artery (MCA) stroke (HCC) 02/22/2017  . Paroxysmal A-fib (HCC) 02/17/2017  . Atrial fibrillation with RVR (HCC) 02/17/2017  . HLD (hyperlipidemia) 02/17/2017  . Acute hypoxemic respiratory failure (HCC)   . Stroke (cerebrum) (HCC) - Acute MCA infarct due to left CCA/ICA and MCA occlusion, s/p mechanical thrombectomy and left ICA stenting in the setting of newly diagnosed afib 02/14/2017    Expected Discharge Date: Expected Discharge Date: 03/19/17  Team Members Present: Physician leading conference: Dr. Claudette Laws Social Worker Present: Staci Acosta, LCSW Nurse Present: Carlean Purl, RN PT Present: Harless Litten, PTA;Midge Minium, PT OT Present: Johnsie Cancel, OT SLP Present: Jackalyn Lombard, SLP PPS Coordinator present : Edson Snowball, PT     Current Status/Progress Goal Weekly Team Focus  Medical   Right hemiparesis, aphasia, cognitive deficits related to stroke as well as some pre-existing  Maintain medical stability, establish follow-up care   Discharge planning, caregiver education   Bowel/Bladder   pt continent of b/b, lbm 8/11, mod  assist to BR  continent of b/b with mod assist  monitor b/b toilet as needed   Swallow/Nutrition/ Hydration   regular, thin liquids; d/c from ST services with recommendations for follow up HHST         ADL's   Heavy steadying assist toileting; min A UB bathing/dressing; min-mod A LB dressing  Min A overall  Family education, d/c planning   Mobility   minA transfers, minA gait with RW, min/modA stair training  Min-supervision overall  balance, gait, family ed   Communication   D/c from ST with recommendations for HHST follow up          Safety/Cognition/ Behavioral Observations  d/c from ST with recommendations for HHST follow up          Pain   pt denies pain  meeting goal of pain < 2  monitor for pain q shift and prn   Skin   scattered bruises, R shin abrasion healing with foam in place, no breakdown to bottom  meeting goal of no skin breakdown while on IPR  monitor skin q shift and prn, maintain skin integrity    Rehab Goals Patient on target to meet rehab goals: Yes Rehab Goals Revised: none *See Care Plan and progress notes for long and short-term goals.     Barriers to Discharge  Current Status/Progress Possible Resolutions Date Resolved   Physician    Incontinence;Other (comments)  Severe cognitive deficits  Slow progress towards goals  Caregiver training. May need hired caregiver      Nursing                  PT                    OT                  SLP                SW                Discharge Planning/Teaching Needs:  Pt to go home with her husband to provide care.  He is also hiring private duty care to give him time to get outside of the home things done.  Husband is receiving family education.   Team Discussion:  Dr. Wynn Banker feels pt is ready for d/c as scheduled for 03-19-17.  He mentioned that husband would like to have local physicians follow pt.  Pt  will need to see Dr. Pearlean Brownie for f/u and he can refer her to a colleague who works with pts who have MS.  PT feels husband is doing well with family education and is very hands on.  Pt will require light min A.  Was successful with 8" step.  OT stated pt is doing well here, but cautioned husband that she may try to do more at home since it's a familiar environment and not be aware of her deficits.  He expressed understanding and need for supervision.  ST d/c'd pt at overall mod to max A.  Husband received extensive education about pt's deficits with expressive language and cognitive challenges.  Pt is incontinent at times toward nighttime.  RN to go over this with strategies for husband.  Revisions to Treatment Plan:  Speech therapy discharged pt on 03-15-17.    Continued Need for Acute Rehabilitation Level of Care: The patient requires daily medical management by a physician with specialized training in physical medicine and rehabilitation for the following conditions: Daily direction of a multidisciplinary physical rehabilitation program to ensure safe treatment while eliciting the highest outcome that is of practical value to the patient.: Yes Daily medical management of patient stability for increased activity during participation in an intensive rehabilitation regime.: Yes Daily analysis of laboratory values and/or radiology reports with any subsequent need for medication adjustment of medical intervention for : Neurological problems;Cardiac problems  Coy Vandoren, [place] 03/17/2017, 10:24 AM

## 2017-03-16 NOTE — Progress Notes (Signed)
Social Work Patient ID: Kaitlin [name], female   DOB: 29-Feb-1946, 71 y.o.   MRN: [id number]   CSW met with pt and pt's husband to update them on team conference discussion.  Pt is still on track for d/c on 03-19-17 and DME was ordered and [place] arranged.  Pt is also hiring [employer] for private duty assistance so that he can leave the house and complete errands. Pt's husband wants to have everything in place in preparation for Saturday d/c.  CSW will continue to assist pt/husband with d/c planning.

## 2017-03-17 ENCOUNTER — Inpatient Hospital Stay (HOSPITAL_COMMUNITY): Payer: Self-pay | Admitting: Occupational Therapy

## 2017-03-17 ENCOUNTER — Inpatient Hospital Stay (HOSPITAL_COMMUNITY): Payer: Self-pay | Admitting: Physical Therapy

## 2017-03-17 NOTE — Progress Notes (Signed)
Subjective/Complaints:  Patient thinks she is going home tomorrow, patient thinks it is Friday, oriented to hospital, only when presented with 3 choices of home Hospital or hotel ROS: pt denies nausea, vomiting, diarrhea, cough, shortness of breath or chest pain   Objective: Vital Signs: Blood pressure (!) 122/47, pulse (!) 57, temperature 97.8 F (36.6 C), temperature source Oral, resp. rate 18, height _0  (1.575 m), weight 53.5 kg (118 lb), SpO2 100 %. No results found. Results for orders placed or performed during the hospital encounter of 02/22/17 (from the past 72 hour(s))  Basic metabolic panel     Status: Abnormal   Collection Time: 03/15/17  4:59 AM  Result Value Ref Range   Sodium 137 135 - 145 mmol/L   Potassium 4.7 3.5 - 5.1 mmol/L   Chloride 106 101 - 111 mmol/L   CO2 23 22 - 32 mmol/L   Glucose, Bld 103 (H) 65 - 99 mg/dL   BUN 19 6 - 20 mg/dL   Creatinine, Ser 0.80 0.44 - 1.00 mg/dL   Calcium 8.5 (L) 8.9 - 10.3 mg/dL   GFR calc non Af Amer >60 >60 mL/min   GFR calc Af Amer >60 >60 mL/min    Comment: (NOTE) The eGFR has been calculated using the CKD EPI equation. This calculation has not been validated in all clinical situations. eGFR's persistently <60 mL/min signify possible Chronic Kidney Disease.    Anion gap 8 5 - 15  CBC     Status: Abnormal   Collection Time: 03/15/17  4:59 AM  Result Value Ref Range   WBC 7.2 4.0 - 10.5 K/uL   RBC 3.00 (L) 3.87 - 5.11 MIL/uL   Hemoglobin 9.6 (L) 12.0 - 15.0 g/dL   HCT 28.4 (L) 36.0 - 46.0 %   MCV 94.7 78.0 - 100.0 fL   MCH 32.0 26.0 - 34.0 pg   MCHC 33.8 30.0 - 36.0 g/dL   RDW 14.5 11.5 - 15.5 %   Platelets 247 150 - 400 K/uL     HEENT: Normocephalic, atraumatic. Cardio: RRR without murmur. No JVD   Pulm: CTA Bilaterally without wheezes or rales. Normal effort   Neuro: alert  Motor: 3-/5 RUE--stable   3-/5 RLE-stable Left upper extremity. Left lower extremity 4+/5 Dysarthria Musc/Skel:  No tenderness, no  edema Gen NAD. Vital signs reivewed   Assessment/Plan: 1. Functional deficits secondary to Left MCA infarct which require 3+ hours per day of interdisciplinary therapy in a comprehensive inpatient rehab setting. Physiatrist is providing close team supervision and 24 hour management of active medical problems listed below. Physiatrist and rehab team continue to assess barriers to discharge/monitor patient progress toward functional and medical goals. FIM: Function - Bathing Position: Shower Body parts bathed by patient: Chest, Abdomen, Left upper leg, Front perineal area, Right arm, Left arm, Right upper leg, Buttocks Body parts bathed by helper: Right lower leg, Left lower leg, Back Assist Level: Touching or steadying assistance(Pt > 75%)  Function- Upper Body Dressing/Undressing What is the patient wearing?: Bra, Pull over shirt/dress Bra - Perfomed by patient: Thread/unthread right bra strap, Thread/unthread left bra strap Bra - Perfomed by helper: Hook/unhook bra (pull down sports bra) Pull over shirt/dress - Perfomed by patient: Thread/unthread left sleeve, Put head through opening, Thread/unthread right sleeve, Pull shirt over trunk Pull over shirt/dress - Perfomed by helper: Pull shirt over trunk Assist Level: Touching or steadying assistance(Pt > 75%) Function - Lower Body Dressing/Undressing What is the patient wearing?: Pants, Shoes, Socks Position:  Wheelchair/chair at sink Underwear - Performed by patient: Thread/unthread right underwear leg, Thread/unthread left underwear leg, Pull underwear up/down Pants- Performed by patient: Thread/unthread right pants leg, Thread/unthread left pants leg, Pull pants up/down Pants- Performed by helper: Fasten/unfasten pants Non-skid slipper socks- Performed by patient: Don/doff right sock, Don/doff left sock Non-skid slipper socks- Performed by helper: Don/doff right sock, Don/doff left sock Socks - Performed by patient: Don/doff right  sock, Don/doff left sock Socks - Performed by helper: Don/doff right sock, Don/doff left sock Shoes - Performed by patient: Don/doff right shoe, Don/doff left shoe Shoes - Performed by helper: Don/doff left shoe AFO - Performed by helper: Don/doff right AFO Assist for footwear: Supervision/touching assist Assist for lower body dressing: Touching or steadying assistance (Pt > 75%)  Function - Toileting Toileting activity did not occur: No continent bowel/bladder event Toileting steps completed by patient: Adjust clothing prior to toileting, Performs perineal hygiene, Adjust clothing after toileting Toileting steps completed by helper: Adjust clothing prior to toileting, Performs perineal hygiene, Adjust clothing after toileting Toileting Assistive Devices: Grab bar or rail Assist level: Touching or steadying assistance (Pt.75%)  Function - Air cabin crew transfer assistive device: Grab bar, Walker Assist level to toilet: Touching or steadying assistance (Pt > 75%) Assist level from toilet: Touching or steadying assistance (Pt > 75%) Assist level to bedside commode (at bedside): Touching or steadying assistance (Pt > 75%) Assist level from bedside commode (at bedside): Touching or steadying assistance (Pt > 75%)  Function - Chair/bed transfer Chair/bed transfer method: Stand pivot Chair/bed transfer assist level: Touching or steadying assistance (Pt > 75%) Chair/bed transfer assistive device: Armrests Chair/bed transfer details: Manual facilitation for weight shifting, Tactile cues for placement, Tactile cues for initiation, Verbal cues for sequencing, Verbal cues for precautions/safety, Verbal cues for safe use of DME/AE  Function - Locomotion: Wheelchair Will patient use wheelchair at discharge?: Yes Type: Manual Max wheelchair distance: 100 ft Assist Level: Supervision or verbal cues Assist Level: Supervision or verbal cues Wheel 150 feet activity did not occur:  Safety/medical concerns Function - Locomotion: Ambulation Assistive device: Orthosis, Walker-rolling Max distance: 130 ft Assist level: Touching or steadying assistance (Pt > 75%) Assist level: Touching or steadying assistance (Pt > 75%) Walk 50 feet with 2 turns activity did not occur: Safety/medical concerns Assist level: Touching or steadying assistance (Pt > 75%) Walk 150 feet activity did not occur: Safety/medical concerns Walk 10 feet on uneven surfaces activity did not occur: Safety/medical concerns  Function - Comprehension Comprehension: Auditory Comprehension assist level: Understands basic 50 - 74% of the time/ requires cueing 25 - 49% of the time  Function - Expression Expression: Verbal Expression assist level: Expresses basic 50 - 74% of the time/requires cueing 25 - 49% of the time. Needs to repeat parts of sentences.  Function - Social Interaction Social Interaction assist level: Interacts appropriately 50 - 74% of the time - May be physically or verbally inappropriate.  Function - Problem Solving Problem solving assist level: Solves basic 50 - 74% of the time/requires cueing 25 - 49% of the time  Function - Memory Memory assist level: Recognizes or recalls 25 - 49% of the time/requires cueing 50 - 75% of the time Patient normally able to recall (first 3 days only): None of the above  Medical Problem List and Plan: 1.  Right hemiparesis and language deficits secondary to left MCA infarct  Cont CIR, PT,OT,SLP, caregiver training 2.  DVT Prophylaxis/Anticoagulation: Pharmaceutical: Xarelto and Plavix, high risk of bleeding, monitor  3. Pain Management: tylenol prn 4. Mood: LCSW to follow for evaluation and support. Sleep/wake cycle  5. Neuropsych: This patient is not capable of making decisions on her own behalf. 6. Skin/Wound Care: Routine pressure relief measures.  7. Fluids/Electrolytes/Nutrition: Monitor I/Os.  Normal BUN and creatinine on 8/14, continue to  encourage fluids 8. A fib: Monitor HR bid. Continue amiodarone and metoprolol for HR control.   On Xarelto- f/u Dr Meda Coffee, cardiology 9. Hypokalemia: Resolved  K+ 4.7 on 8/14    10. ABLA: . Monitor for signs of bleeding.   Hgb  9.6 on 8/6 13. Multiple Sclerosis: No medications? Pt /husband would like to f/u with local neurologist for MS  14. H/o Depression: On Effexor and prozac.  17. H/o CAD s/p PTCA: On Lipitor  16. Lethargy: Overall improved, now mainly due to effects of CVA  17. Labile blood pressure     -am systolic elevation 0/96, monitor, no medication changes Vitals:   03/17/17 0355 03/17/17 1406  BP: (!) 142/53 (!) 122/47  Pulse: 65 (!) 57  Resp: 17 18  Temp: 99.3 F (37.4 C) 97.8 F (36.6 C)  SpO2: 99% 100%   18. Hypoalbuminemia  Supplement initiated 7/28    LOS (Days) 23 A FACE TO FACE EVALUATION WAS PERFORMED  KIRSTEINS,ANDREW E 03/17/2017, 2:43 PM

## 2017-03-17 NOTE — Plan of Care (Signed)
Problem: RH BOWEL ELIMINATION Goal: RH STG MANAGE BOWEL W/MEDICATION W/ASSISTANCE STG Manage Bowel with Medication with mod Assistance.   Outcome: Progressing  03/17/17 0308  Bowel Management Goals  STG: Pt will manage bowels with medication with assistance 3-Moderate assistance    Problem: RH BLADDER ELIMINATION Goal: RH STG MANAGE BLADDER WITH ASSISTANCE STG Manage Bladder With max Assistance   Outcome: Progressing  03/17/17 0308  Bladder Management Goals  STG: Pt will manage bladder with assistance 3-Moderate assistance    Problem: RH SKIN INTEGRITY Goal: RH STG MAINTAIN SKIN INTEGRITY WITH ASSISTANCE STG Maintain Skin Integrity With mod Assistance.   Outcome: Progressing  03/17/17 0308  Skin Integrity Goals  STG: Maintain skin integrity with assistance 3-Moderate assistance    Problem: RH SAFETY Goal: RH STG ADHERE TO SAFETY PRECAUTIONS W/ASSISTANCE/DEVICE STG Adhere to Safety Precautions With mod Assistance/Device.   Outcome: Progressing  03/17/17 0308  Safety Goals  STG:Pt will adhere to safety precautions with assistance/device 3-Moderate assistance   Goal: RH STG DECREASED RISK OF FALL WITH ASSISTANCE STG Decreased Risk of Fall With mod Assistance.   Outcome: Progressing  03/17/17 0308  Safety Goals  [id number] risk of fall with assistance/device 3-Moderate assistance    03/17/17 0308  Safety Goals  [id number] risk of fall with assistance/device 3-Moderate assistance

## 2017-03-17 NOTE — Progress Notes (Signed)
Occupational Therapy Session Note  Patient Details  Name: Kaitlin [name] MRN: [id number] Date of Birth: 1946-02-29  Today's Date: 03/17/2017 OT Individual Time: 7673-4193 OT Individual Time Calculation (min): 75 min    Short Term Goals: Week 3:  OT Short Term Goal 1 (Week 3): STG=LTG due to LOS  Skilled Therapeutic Interventions/Progress Updates:    Pt seen for OT session focusing on ADL re-training and caregiver training. Pt asleep in supine upon arrival, easily awoken and agreeable to tx session. She ambulated throughout room with min A and multimodal cuing for RW management. Completed toileting task with heavy steadying assist due to decreased BOS, unable to correct with VCs. She gathered clothing items from drawers, bending down with mod A to gather all clothing items. She dressed seated in w/c, VCs for hemi dressing technique and required VCs for redirection to task as pt distracted by conversation btwn therapist and pt's husband. Pt's husband provided pictures of home bathrooms and shower. Discussed DME and technique for transfers as well as recommendation for w/c level within home at d/c for increased pt independnence and safety. She self propelled w/c using B LEs, therapist attempted to have pt use R UE to assist with propulsion, however, pt unable to maintain attention to R UE during functional propulsion. In ADL apartment, reviewed DME with husband and pt including use of drop arm BSC at bed side and over toileting. He then complete stand pivot transfer without AD  w/c <> BSC without use of AD, Completed with min A and min cuing from theapist for technique. Pt's husband returned her to room in w/c at end of session.   Therapy Documentation Precautions:  Precautions Precautions: Fall Precaution Comments: R side inattention; impulsive Restrictions Weight Bearing Restrictions: No Pain:    No/ denies pain  See Function Navigator for Current Functional Status.   Therapy/Group:  Individual Therapy  Lewis, Raechel Marcos C 03/17/2017, 7:06 AM

## 2017-03-17 NOTE — Progress Notes (Signed)
Physical Therapy Session Note  Patient Details  Name: Nayleah Gamel MRN: [id number] Date of Birth: 04-21-1946  Today's Date: 03/17/2017 PT Individual Time: 1100-1200 PT Individual Time Calculation (min): 60 min   Short Term Goals: Week 3:  PT Short Term Goal 1 (Week 3): STG =LTG due to ELOS  Skilled Therapeutic Interventions/Progress Updates:    Pt up in w/c upon arrival, agreeable to PT session. Spouse present and supportive throughout session. Working with pt and spouse on w/c management and education of parts and use. Ambulation: performed with rw on hard and carpeted surfaces with min guard assistance. Cues for increased Rt dorsiflexion/hip flexion for improved clearance with swing phase. Transfers performed from w/c and couch level with repeated cues for sequence. W/c propulsion in halls performed with cues for maintaining trajectory and technique. Following session, pt up in w/c with QRB on, all needs in reach and spouse present.    Therapy Documentation Precautions:  Precautions Precautions: Fall Precaution Comments: R side inattention; impulsive Restrictions Weight Bearing Restrictions: No Pain: Pain Assessment Pain Assessment: No/denies pain Faces Pain Scale: No hurt   See Function Navigator for Current Functional Status.   Therapy/Group: Individual Therapy  Delton See, PT 03/17/2017, 3:06 PM

## 2017-03-17 NOTE — Progress Notes (Signed)
Occupational Therapy Session Note  Patient Details  Name: Kaitlin [name] MRN: [id number] Date of Birth: 04/11/1946  Today's Date: 03/17/2017 OT Individual Time: 1330-1400 OT Individual Time Calculation (min): 30 min    Skilled Therapeutic Interventions/Progress Updates:    1:1 Husband present for session.  AFO and toe cap on shoe arrived in session. Trial of new AFO with functional ambulation and transfers in and out of shower stall - simulated like home setup with education provided to husband. Shoe lace on right foot changed back to regular laces for better foot (shoe was riding up and down on heel - shoe not fitting snug). Husband to perform ADL tasks with her tomorrow including real shower transfers. Pt continues to require cues for proper hand placement each time for sit <>stands and to slow down when right LE fatigues still requiring min A. Left in w/c with husband with safety belt donned.   Therapy Documentation Precautions:  Precautions Precautions: Fall Precaution Comments: R side inattention; impulsive Restrictions Weight Bearing Restrictions: No Pain: Pain Assessment Pain Assessment: No/denies pain  See Function Navigator for Current Functional Status.   Therapy/Group: Individual Therapy  Roney Mans [facility] 03/17/2017, 4:30 PM

## 2017-03-17 NOTE — Discharge Summary (Signed)
Physician Discharge Summary  Patient ID: Kaitlin [name] MRN: [id number] DOB/AGE: 09-Mar-1946 71 y.o.  Admit date: 02/22/2017 Discharge date: 03/19/2017  Discharge Diagnoses:  Principal Problem:   Acute ischemic left middle cerebral artery (MCA) stroke (HCC) Active Problems:   Abnormality of gait as late effect of stroke   Dysarthria as late effect of stroke   Dysphagia as late effect of stroke   Aspiration pneumonia of right lung (HCC)   Leukocytosis   Hypokalemia   Labile blood pressure   Right hemiparesis (HCC)   Discharged Condition:  Stable.   Significant Diagnostic Studies: Dg Chest 2 View  Result Date: 02/24/2017 CLINICAL DATA:  CVA, weakness. EXAM: CHEST  2 VIEW COMPARISON:  Portable chest x-ray of February 18, 2017 FINDINGS: The lungs are well-expanded. The interstitial infiltrate observed in the right upper lobe has nearly resolved. Elsewhere the lungs are clear. There are small pleural effusions greatest on the right which later posteriorly. The heart and pulmonary vascularity are normal. There is calcification in the wall of the aortic arch. The bony thorax exhibits no acute abnormality. IMPRESSION: Improving appearance of infiltrate in the right upper lobe consistent with resolving pneumonia. Persistent small bilateral pleural effusions. Electronically Signed   By: David  [place] M.D.   On: 02/24/2017 07:55   Dg Chest Port 1 View  Result Date: 02/18/2017 CLINICAL DATA:  Crackles in both lungs this morning on physical examination. EXAM: PORTABLE CHEST 1 VIEW COMPARISON:  Single-view of the chest 02/16/2017. PA and lateral chest and CT chest 04/26/2012. FINDINGS: There is a small to moderate right pleural effusion. Airspace disease is seen in the right lower and right upper lobes. There is also some airspace disease in the medial left lower lobe. No pneumothorax. IMPRESSION: Right much worse than left airspace disease has an appearance most worrisome for pneumonia. Associated small to  moderate right pleural effusion is noted. Electronically Signed   By: Drusilla Kanner M.D.   On: 02/18/2017 12:20    Labs:  Basic Metabolic Panel: BMP Latest Ref Rng & Units 03/15/2017 03/12/2017 03/11/2017  Glucose 65 - 99 mg/dL 119(J) 98 98  BUN 6 - 20 mg/dL 19 16 47(W)  Creatinine 0.44 - 1.00 mg/dL 2.95 6.21 3.08  Sodium 135 - 145 mmol/L 137 138 137  Potassium 3.5 - 5.1 mmol/L 4.7 4.2 5.3(H)  Chloride 101 - 111 mmol/L 106 107 105  CO2 22 - 32 mmol/L 23 24 19(L)  Calcium 8.9 - 10.3 mg/dL 6.5(H) 8.9 8.9    CBC: CBC Latest Ref Rng & Units 03/15/2017 03/07/2017 03/04/2017  WBC 4.0 - 10.5 K/uL 7.2 9.7 12.4(H)  Hemoglobin 12.0 - 15.0 g/dL 8.4(O) 10.1(L) 10.7(L)  Hematocrit 36.0 - 46.0 % 28.4(L) 29.7(L) 31.5(L)  Platelets 150 - 400 K/uL 247 347 393    CBG: No results for input(s): GLUCAP in the last 168 hours.  Brief HPI:   Kaitlin [name] a 71 y.o.female with history of CAD s/p PTCA, MS with gait disorder/memory loss, PAF who was admitted on 02/14/17 with reports of speech difficulty progressing to right sided weakness later that day. CTA head showed evidence of infarct left insular and left posterior putamen with occlusion of L-CCA and L-ICA from arch to distal cavernous segment and incidental finding of centrilobar emphysema in lung apices. She underwent cerebral angio with complete revascularization of occluded L-MCA with one pass Solitaire and stent assisted angioplasty of proximal L-ICA with IA integrelin. Follow up MRI brain done revealing acute confluent infarct involving the left putamen, caudate  body, and corona radiata, 2 tiny cortical infarcts in the left MCA distribution and extensive white matter disease due to MS and chronic microvascular ischemia. She developed A fib with RVR requiring amiodarone and esmolol. Dr. Delton See recommended Eliquis bid for A fib (as likely cause of stroke) and changing Brilinta to Plavix.  She developed respiratory due to RUL/RLL aspiration and was  started on Levaquin for treatment. Respiratory status has improved but she continued to have significant deficits in mobility as well as ability to carry out ADL task therefore CIR recommended for follow up therapy.     Hospital Course: Kaitlin [name] was admitted to rehab 02/22/2017 for inpatient therapies to consist of PT, ST and OT at least three hours five days a week. Past admission physiatrist, therapy team and rehab RN have worked together to provide customized collaborative inpatient rehab. She has completed treatment 7 day course of antibiotic regimen for treatment of aspiration PNA and lethargy has resolved with improvement in sleep wake cycle.  She continues on Xarelto for secondary stroke prevention and has been tolerating this without side effects. CBC has been monitored and has shown slight drop with hydration but has been ranging from 9.3 - 10.7. No signs of bleeding noted. She developed acute renal failure on 7/31 due to poor po intake and was hydrated with IVF.  Fluids were offered frequently during the day to help with hydration and IVF were discontinued on 8/12. Follow up lytes shows renal status is stable.  Reactive leucocytosis has resolved and no signs of infection noted. Blood pressures have been monitored on bid basis and she was noted to have issues with intermittent am elevations.  Heart rate is stable on amiodarone and metoprolol.   Protein supplement was added due to low protein stores. Her po intake is good and she is continent of bowel and bladder.  Her progress has been affected by cognitive deficits as she continues to require max to total assist for recall. She continues to have significant cognitive deficits affecting safety, awareness and recall.     Rehab course: During patient's stay in rehab weekly team conferences were held to monitor patient's progress, set goals and discuss barriers to discharge. At admission, patient required max assist with basic self care tasks and  mobility. She exhibited moderate oral phase dysphagia, moderate to severe cognitive deficits and moderate to severe expressive > receptive aphasia. She has had improvement in activity tolerance, balance, postural control, as well as ability to compensate for deficits. She is has had improvement in functional use RUE and RLE  as well as improved awareness.  he is able to complete ADL tasks with min assist and cognitive assist due to issues with recall. cues for safety. She requires min/guard assist for transfers and  is able to ambulate 150' with RW and verbal cues for safety. She is able to express her self with mod to max assist, requires min to mod assist with basic problem solving and mod to max assist for recall. Her husband has been very supportive and been present for multiple therapy sessions. Family education was completed regarding all aspects of safety, cognition and mobility. He has also hired assistance to help with respite care after discharge.     Disposition: 01-Home or Self Care  Diet: Heart healthy.   Special Instructions: 1. Needs BMET and CBC in 1-2 weeks to monitor for hydration and  H/H. 2. Needs 24 hours supervision.   Discharge Instructions    Ambulatory referral to Physical  Medicine Rehab    Complete by:  As directed    1-2 weeks transitional care appt     Allergies as of 03/19/2017      Reactions   Eliquis [apixaban] Rash   Penicillins       Medication List    STOP taking these medications   aspirin EC 81 MG tablet   gabapentin 300 MG capsule Commonly known as:  NEURONTIN   omega-3 acid ethyl esters 1 g capsule Commonly known as:  LOVAZA   pravastatin 10 MG tablet Commonly known as:  PRAVACHOL   traMADol 50 MG tablet Commonly known as:  ULTRAM     TAKE these medications   amiodarone 200 MG tablet Commonly known as:  PACERONE Take 1 tablet (200 mg total) by mouth daily.   atorvastatin 40 MG tablet Commonly known as:  LIPITOR Take 1 tablet (40 mg  total) by mouth daily at 6 PM.   CALCIUM CITRATE + D PO Take 1 tablet by mouth daily.   clopidogrel 75 MG tablet Commonly known as:  PLAVIX Take 1 tablet (75 mg total) by mouth daily.   FLUoxetine 10 MG capsule Commonly known as:  PROZAC Take 1 capsule (10 mg total) by mouth daily.   metoprolol tartrate 50 MG tablet Commonly known as:  LOPRESSOR Take 1 tablet (50 mg total) by mouth 2 (two) times daily.   pantoprazole 40 MG tablet Commonly known as:  PROTONIX Take 1 tablet (40 mg total) by mouth daily.   PRESERVISION AREDS PO Take 1 tablet by mouth 2 (two) times daily.   Rivaroxaban 15 MG Tabs tablet Commonly known as:  XARELTO Take 1 tablet (15 mg total) by mouth daily with supper.   traZODone 50 MG tablet Commonly known as:  DESYREL Take 1 tablet (50 mg total) by mouth at bedtime.   venlafaxine XR 75 MG 24 hr capsule Commonly known as:  EFFEXOR-XR Take 1 capsule (75 mg total) by mouth daily.      Follow-up Information    Kirsteins, Victorino Sparrow, MD Follow up.   Specialty:  Physical Medicine and Rehabilitation Why:  office will call you with follow up appointment Contact information: [address] [place] [address] [phone]        Chilton Greathouse, MD Follow up.   Specialty:  Internal Medicine Why:  The office will call you on Monday to go over how she is doing and they will schedule a post-hospital follow up appointment with Dr. Felipa Eth. Contact information: [address] [place] [address] [phone]        Micki Riley, MD. Call in 1 day(s).   Specialties:  Neurology, Radiology Why:  for follow up appointment in 2-3 weeks.  Contact information: [address] [place] [address] [phone]        Julieanne Cotton, MD Follow up.   Specialty:  Interventional Radiology Why:  will call for follow up appointment on stent Contact information: [address] [place]  [address] [phone]        Lars Masson, MD Follow up.   Specialty:  Cardiology Contact information: [address] [place] [address] [phone]           Signed: Jacquelynn Cree 03/19/2017, 2:21 PM

## 2017-03-17 NOTE — Progress Notes (Signed)
Occupational Therapy Session Note  Patient Details  Name: Kaitlin [name] MRN: [id number] Date of Birth: Jun 24, 1946  Today's Date: 03/17/2017 OT Individual Time: 4765-4650 OT Individual Time Calculation (min): 30 min    Short Term Goals: Week 3:  OT Short Term Goal 1 (Week 3): STG=LTG due to LOS  Skilled Therapeutic Interventions/Progress Updates:    Pt received sitting up in w/c, agreeable to OT tx session. Pt's spouse present during session and actively participating in family/caregiver education. Focus of session on caregiver education, functional mobility transfers, and d/c planning. Pt with plans to return home with drop arm BSC. Simulated home environment for use of BSC at night including height of bed and transferring to/from side of bed which Pt will be transferring at home. Spouse completing stand pivot transfers w/c<>EOB, EOB<>drop arm bedside commode with therapist providing education and close guard for safety throughout. Spouse provides MinA to complete transfers. Pt also completing bed mobility sitting EOB<>supine with spouse providing necessary assistance (supervision EOB>supine, minA supine>sitting EOB) as therapist provides education/supervision throughout.  Pt left seated in w/c, call bell and needs within reach, spouse present to supervise.   Therapy Documentation Precautions:  Precautions Precautions: Fall Precaution Comments: R side inattention; impulsive Restrictions Weight Bearing Restrictions: No   Pain: Pain Assessment Pain Assessment: No/denies pain   Therapy/Group: Individual Therapy  Orlando Penner 03/17/2017, 4:06 PM

## 2017-03-18 ENCOUNTER — Inpatient Hospital Stay (HOSPITAL_COMMUNITY): Payer: Self-pay | Admitting: Occupational Therapy

## 2017-03-18 ENCOUNTER — Inpatient Hospital Stay (HOSPITAL_COMMUNITY): Payer: Self-pay | Admitting: Physical Therapy

## 2017-03-18 ENCOUNTER — Ambulatory Visit: Payer: Self-pay | Admitting: Cardiology

## 2017-03-18 MED ORDER — VENLAFAXINE HCL ER 75 MG PO CP24: 75.0000 mg | ORAL_CAPSULE | Freq: Every day | ORAL | 0 refills | Status: DC

## 2017-03-18 MED ORDER — CLOPIDOGREL BISULFATE 75 MG PO TABS: 75.0000 mg | ORAL_TABLET | Freq: Every day | ORAL | 0 refills | Status: DC

## 2017-03-18 MED ORDER — ATORVASTATIN CALCIUM 40 MG PO TABS: 40.0000 mg | ORAL_TABLET | Freq: Every day | ORAL | 0 refills | Status: DC

## 2017-03-18 MED ORDER — FLUOXETINE HCL 10 MG PO CAPS: 10.0000 mg | ORAL_CAPSULE | Freq: Every day | ORAL | 0 refills | Status: DC

## 2017-03-18 MED ORDER — RIVAROXABAN 15 MG PO TABS: 15.0000 mg | ORAL_TABLET | Freq: Every day | ORAL | 0 refills | Status: DC

## 2017-03-18 MED ORDER — AMIODARONE HCL 200 MG PO TABS: 200.0000 mg | ORAL_TABLET | Freq: Every day | ORAL | 0 refills | Status: DC

## 2017-03-18 MED ORDER — PANTOPRAZOLE SODIUM 40 MG PO TBEC: 40.0000 mg | DELAYED_RELEASE_TABLET | Freq: Every day | ORAL | 0 refills | Status: AC

## 2017-03-18 MED ORDER — METOPROLOL TARTRATE 50 MG PO TABS: 50.0000 mg | ORAL_TABLET | Freq: Two times a day (BID) | ORAL | 0 refills | Status: DC

## 2017-03-18 MED ORDER — TRAZODONE HCL 50 MG PO TABS: 50.0000 mg | ORAL_TABLET | Freq: Every day | ORAL | 0 refills | Status: DC

## 2017-03-18 NOTE — Progress Notes (Signed)
Occupational Therapy Session Note  Patient Details  Name: Kaitlin [name] MRN: [id number] Date of Birth: 1946-04-23  Today's Date: 03/18/2017 OT Individual Time: 1030-1100 OT Individual Time Calculation (min): 30 min    Short Term Goals: Week 3:  OT Short Term Goal 1 (Week 3): STG=LTG due to LOS  Skilled Therapeutic Interventions/Progress Updates:    Treatment session focused on transfer safety in<>out of simulated shower at patient's home and education to pt and caregiver for d/c tomorrow. Therapist simulated tub/shower ledge in pt's room to practice with husband. Therapist instructed husband on coaching techniques to assist with transfer into tub with a back in approach with stepping back over the tub ledge. Therapist reiterated importance of giving simple verbal and tactile cues to initiate movements to husband. Practiced 2 times with same technique with husband guiding from the back and to the right side with walker to step back over ledge. technique was practiced for transfer out of shower with similar technique. Pt did not c.o of pain and required min A for transfers. Pt left resting in w/c with call bell in reach and husband present.   Therapy Documentation Precautions:  Precautions Precautions: Fall Precaution Comments: improving R inattention Restrictions Weight Bearing Restrictions: No   Pain: Pain Assessment Pain Assessment: No/denies pain ADL:   Vision Baseline Vision/History: Wears glasses Wears Glasses: At all times Patient Visual Report: No change from baseline Perception  Perception: Impaired Inattention/Neglect: Does not attend to right visual field;Does not attend to right side of body  See Function Navigator for Current Functional Status.   Therapy/Group: Individual Therapy  Verdie Shire 03/18/2017, 12:55 PM

## 2017-03-18 NOTE — Progress Notes (Signed)
Social Work Discharge Note  The overall goal for the admission was met for:   Discharge location: Yes - pt to return to her home with her husband   Length of Stay: Yes - 25 days  Discharge activity level: Yes - minimal assistance with mod to max cueing for cognition  Home/community participation: Yes  Services provided included: MD, RD, PT, OT, SLP, RN, Pharmacy and SW  Financial Services: Private Insurance: [employer]  Follow-up services arranged: Home Health: PT/OT/ST, DME: 16"x16" hemi-height, lightweight wheelchair with basic cushion and right half lap tray; rolling walker; drop arm commode and Patient/Family request agency HH: [facility], DME: [facility]  Comments (or additional information): Pt to d/c to her home with her husband who has received family education.  He feels prepared to take pt home and has hired [employer] to assist pt/husband at home so that he can leave the house to run errands, attend meetings, etc.  Pt will also have [facility] for skilled services.  Patient/Family verbalized understanding of follow-up arrangements: Yes  Individual responsible for coordination of the follow-up plan: pt's husband  Confirmed correct DME delivered: Trey Sailors 03/18/2017    Prevatt, Silvestre Mesi

## 2017-03-18 NOTE — Progress Notes (Signed)
Occupational Therapy Session Note  Patient Details  Name: Kaitlin [name] MRN: [id number] Date of Birth: 1946/05/05  Today's Date: 03/18/2017 OT Individual Time: 4982-6415 1430-1500 OT Individual Time Calculation (min): 75 min and 30 min    Short Term Goals: Week 3:  OT Short Term Goal 1 (Week 3): STG=LTG due to LOS  Skilled Therapeutic Interventions/Progress Updates:    Session One: Pt seen for OT ADL bathing/dressing session with emphasis on family education. Pt in supine upon arrival finishing breakfast with husband present, agreeable to tx session. Pt's husband provided all assist today in prep for d/c home tomorrow. Min A provided for all ambulation with RW. She bathed seated on BSC in shower, VCs for attention to all body parts. Pt able to use R UE at gross assist level mod I. She transferred out of shower via stand pivot and dressed seated in w/c, VCs for hemi dressing technique. Pt's husband able to provide proper cuing throughout. Pt asking for unneeded assist throughout session from husband for dressing tasks. She completed grooming tasks standing at sink, using B UEs simultaneously and maintaining standing balance with close supervision. Pt returned to w/c at end of session, left with QRB donned and husband present.  Educated throughout session regarding importance of participation with ADLs, decreasing caregiver burden, techniques and recommendations for shower/ bathing tasks, energy conservation, and d/c planning.  Session Two: Pt seen for OT session focusing on ADL re-training and education with pt's husband. Pt asleep in supine, easily awoken and once re-oriented to time and situation and with encouragement, pt agreeable. She ambulated throughout room with min A using RW, min A for RW management during functional transfers. Toileting task completed with steadying assist then ambulated to sink to wash hands, VCs for midline orientation due to R lean. Pt requesting to sit in  recliner at end of session, left with QRB donned and all needs in reach. Husband present throughout session, all questions answered and ready for d/c home tomorrow.     Therapy Documentation Precautions:  Precautions Precautions: Fall Precaution Comments: R side inattention; impulsive Restrictions Weight Bearing Restrictions: No Pain:    No/denies pain  See Function Navigator for Current Functional Status.   Therapy/Group: Individual Therapy  Lewis, Buford Bremer C 03/18/2017, 7:10 AM

## 2017-03-18 NOTE — Plan of Care (Signed)
Problem: RH Awareness Goal: LTG: Patient will demonstrate intellectual/emergent (PT) LTG: Patient will demonstrate intellectual/emergent/anticipatory awareness with assist during a mobility activity  (PT)  Outcome: Not Met (add Reason) Continues to require mod/max assist for emergent awareness

## 2017-03-18 NOTE — Progress Notes (Signed)
Physical Therapy Discharge Summary  Patient Details  Name: Kaitlin [name] MRN: [id number] Date of Birth: 1946/02/28  Today's Date: 03/18/2017 PT Individual Time: 1105-1200 PT Individual Time Calculation (min): 55 min    Patient has met 10 of 11 long term goals due to improved activity tolerance, improved balance, improved postural control, increased strength, ability to compensate for deficits, functional use of  right upper extremity and right lower extremity and improved attention.  Patient to discharge at household ambulatory, community w/c level [place].   Patient's care partner is independent to provide the necessary physical and cognitive assistance at discharge.  Reasons goals not met:  Pt continues to require increased assist for all levels of awareness.   Recommendation:  Patient will benefit from ongoing skilled PT services in home health setting to continue to advance safe functional mobility, address ongoing impairments in cognition, balance, strength, coordination, perception, and postural control, and minimize fall risk.  Equipment: w/c and RW  Reasons for discharge: treatment goals met and discharge from hospital  Patient/family agrees with progress made and goals achieved: Yes   Skilled PT Intervention: No c/o pain.  Session focus on d/c assessment and family education with pt's husband, Wille Glaser.    Pt currently performing functional mobility at supervision>min guard level.  Requires min/mod cues for safety awareness and sequencing during all activities.  [name] provided hands on assist for car transfer and ambulation.  Discussed home entry with 1 step onto porch versus 4 steps into garage and practicing with HHPT to determine positioning of rails, etc.  [name] reports no further questions.  Returned to room and positioned in w/c with QRB in place, call bell in reach and needs met.   PT Discharge Precautions/Restrictions Precautions Precautions: Fall Precaution Comments:  improving R inattention Pain Pain Assessment Pain Assessment: No/denies pain Vision/Perception  Perception Perception: Impaired Inattention/Neglect: Does not attend to right visual field;Does not attend to right side of body  Cognition Overall Cognitive Status: Impaired/Different from baseline Arousal/Alertness: Awake/alert Orientation Level: Oriented to person (mod cues for orientation to time, place, and situation) Attention: Selective Selective Attention: Appears intact Awareness: Impaired Problem Solving: Impaired Safety/Judgment: Impaired Comments: decreaced awareness of deficits  Sensation Sensation Light Touch: Appears Intact (LEs) Proprioception: Impaired Detail Proprioception Impaired Details: Impaired RLE Coordination Gross Motor Movements are Fluid and Coordinated: Yes Fine Motor Movements are Fluid and Coordinated: No Coordination and Movement Description: R hemiparesis Motor  Motor Motor: Hemiplegia Motor - Discharge Observations: R hemiparesis UE>LE  Mobility Bed Mobility Bed Mobility: Supine to Sit;Sit to Supine Supine to Sit: 5: Supervision Sit to Supine: 5: Supervision Transfers Transfers: Yes Sit to Stand: 4: Min guard Stand Pivot Transfers: 4: Min guard Squat Pivot Transfers: 4: Min guard Locomotion  Ambulation Ambulation: Yes Ambulation/Gait Assistance: 4: Min guard Ambulation Distance (Feet): 150 Feet Assistive device: Rolling walker Ambulation/Gait Assistance Details: Verbal cues for precautions/safety Gait Gait: Yes Gait Pattern: Decreased stance time - right;Decreased step length - right;Right flexed knee in stance;Step-through pattern;Poor foot clearance - right Stairs / Additional Locomotion Stairs: Yes Stairs Assistance: 4: Min guard Stair Management Technique: One rail Left Number of Stairs: 12  Trunk/Postural Assessment  Cervical Assessment Cervical Assessment: Exceptions to [facility] Thoracic Assessment Thoracic Assessment:  Exceptions to [facility] Lumbar Assessment Lumbar Assessment: Exceptions to [facility] Postural Control Postural Control: Deficits on evaluation (delayed righting reactions )  Balance Balance Balance Assessed: Yes Static Sitting Balance Static Sitting - Balance Support: Feet supported;No upper extremity supported Static Sitting - Level of Assistance: 5:  Stand by assistance Static Standing Balance Static Standing - Balance Support: Left upper extremity supported;Right upper extremity supported;During functional activity Static Standing - Level of Assistance: 4: Min assist Dynamic Standing Balance Dynamic Standing - Balance Support: During functional activity Dynamic Standing - Level of Assistance: 4: Min assist Extremity Assessment      RLE Assessment RLE Assessment: Exceptions to [facility] RLE Strength Right Hip Flexion: 3+/5 Right Knee Flexion: 4/5 Right Knee Extension: 3+/5 LLE Assessment LLE Assessment: Within Functional Limits (4+/5 throughout)   See Function Navigator for Current Functional Status.  Kaitlin [name] E Penven-Crew 03/18/2017, 11:49 AM

## 2017-03-18 NOTE — Progress Notes (Signed)
Subjective/Complaints:  Patient is working with OT. Husband is getting caregiver education.  ROS: pt denies nausea, vomiting, diarrhea, cough, shortness of breath or chest pain   Objective: Vital Signs: Blood pressure (!) 161/58, pulse 67, temperature 98.6 F (37 C), temperature source Oral, resp. rate 15, height 5\' 2"  (1.575 m), weight 53.5 kg (118 lb), SpO2 96 %. No results found. No results found for this or any previous visit (from the past 72 hour(s)).   HEENT: Normocephalic, atraumatic. Cardio: RRR without murmur. No JVD   Pulm: CTA Bilaterally without wheezes or rales. Normal effort   Neuro: alert  Motor: 3-/5 RUE--stable   3-/5 RLE-stable Left upper extremity. Left lower extremity 4+/5 Dysarthria Musc/Skel:  No tenderness, no edema Gen NAD. Vital signs reivewed Orientation to person, "[place]"  Assessment/Plan: 1. Functional deficits secondary to Left MCA infarct which require 3+ hours per day of interdisciplinary therapy in a comprehensive inpatient rehab setting. Physiatrist is providing close team supervision and 24 hour management of active medical problems listed below. Physiatrist and rehab team continue to assess barriers to discharge/monitor patient progress toward functional and medical goals. FIM: Function - Bathing Position: Shower Body parts bathed by patient: Chest, Abdomen, Left upper leg, Front perineal area, Right arm, Left arm, Right upper leg, Buttocks Body parts bathed by helper: Right lower leg, Left lower leg, Back Assist Level: Touching or steadying assistance(Pt > 75%)  Function- Upper Body Dressing/Undressing What is the patient wearing?: Bra, Pull over shirt/dress Bra - Perfomed by patient: Thread/unthread right bra strap, Thread/unthread left bra strap Bra - Perfomed by helper: Hook/unhook bra (pull down sports bra) Pull over shirt/dress - Perfomed by patient: Thread/unthread left sleeve, Put head through opening, Thread/unthread right  sleeve, Pull shirt over trunk Pull over shirt/dress - Perfomed by helper: Pull shirt over trunk Assist Level: Touching or steadying assistance(Pt > 75%) Function - Lower Body Dressing/Undressing What is the patient wearing?: Pants, Shoes, Socks Position: Wheelchair/chair at sink Underwear - Performed by patient: Thread/unthread right underwear leg, Thread/unthread left underwear leg, Pull underwear up/down Pants- Performed by patient: Thread/unthread right pants leg, Thread/unthread left pants leg, Pull pants up/down Pants- Performed by helper: Fasten/unfasten pants Non-skid slipper socks- Performed by patient: Don/doff right sock, Don/doff left sock Non-skid slipper socks- Performed by helper: Don/doff right sock, Don/doff left sock Socks - Performed by patient: Don/doff right sock, Don/doff left sock Socks - Performed by helper: Don/doff right sock, Don/doff left sock Shoes - Performed by patient: Don/doff right shoe, Don/doff left shoe Shoes - Performed by helper: Don/doff left shoe AFO - Performed by helper: Don/doff right AFO Assist for footwear: Supervision/touching assist Assist for lower body dressing: Touching or steadying assistance (Pt > 75%)  Function - Toileting Toileting activity did not occur: No continent bowel/bladder event Toileting steps completed by patient: Adjust clothing prior to toileting, Performs perineal hygiene, Adjust clothing after toileting Toileting steps completed by helper: Adjust clothing prior to toileting, Performs perineal hygiene, Adjust clothing after toileting Toileting Assistive Devices: Grab bar or rail Assist level: Touching or steadying assistance (Pt.75%)  Function - Archivist transfer assistive device: Bedside commode Assist level to toilet: Touching or steadying assistance (Pt > 75%) Assist level from toilet: Touching or steadying assistance (Pt > 75%) Assist level to bedside commode (at bedside): Touching or steadying  assistance (Pt > 75%) Assist level from bedside commode (at bedside): Touching or steadying assistance (Pt > 75%)  Function - Chair/bed transfer Chair/bed transfer method: Stand pivot Chair/bed transfer assist level:  Touching or steadying assistance (Pt > 75%) Chair/bed transfer assistive device: Armrests Chair/bed transfer details: Tactile cues for weight shifting, Tactile cues for posture, Tactile cues for placement, Manual facilitation for weight shifting, Verbal cues for sequencing, Verbal cues for precautions/safety, Verbal cues for technique  Function - Locomotion: Wheelchair Will patient use wheelchair at discharge?: Yes Type: Manual Max wheelchair distance: 100 ft Assist Level: Supervision or verbal cues Assist Level: Supervision or verbal cues Wheel 150 feet activity did not occur: Safety/medical concerns Function - Locomotion: Ambulation Assistive device: Orthosis, Walker-rolling Max distance: 110 ft Assist level: Touching or steadying assistance (Pt > 75%) Assist level: Touching or steadying assistance (Pt > 75%) Walk 50 feet with 2 turns activity did not occur: Safety/medical concerns Assist level: Touching or steadying assistance (Pt > 75%) Walk 150 feet activity did not occur: Safety/medical concerns Walk 10 feet on uneven surfaces activity did not occur: Safety/medical concerns  Function - Comprehension Comprehension: Auditory Comprehension assist level: Understands basic 50 - 74% of the time/ requires cueing 25 - 49% of the time  Function - Expression Expression: Verbal Expression assist level: Expresses basic 50 - 74% of the time/requires cueing 25 - 49% of the time. Needs to repeat parts of sentences.  Function - Social Interaction Social Interaction assist level: Interacts appropriately 50 - 74% of the time - May be physically or verbally inappropriate.  Function - Problem Solving Problem solving assist level: Solves basic 50 - 74% of the time/requires cueing  25 - 49% of the time  Function - Memory Memory assist level: Recognizes or recalls 25 - 49% of the time/requires cueing 50 - 75% of the time Patient normally able to recall (first 3 days only): None of the above  Medical Problem List and Plan: 1.  Right hemiparesis and language deficits secondary to left MCA infarct  Cont CIR, PT,OT,SLP, should be ready for d/c in am 2.  DVT Prophylaxis/Anticoagulation: Pharmaceutical: Xarelto and Plavix, high risk of bleeding, monitor 3. Pain Management: tylenol prn 4. Mood: LCSW to follow for evaluation and support. Sleep/wake cycle  5. Neuropsych: This patient is not capable of making decisions on her own behalf. 6. Skin/Wound Care: Routine pressure relief measures.  7. Fluids/Electrolytes/Nutrition: Monitor I/Os.  Normal BUN and creatinine on 8/14, continue to encourage fluids 8. A fib: Monitor HR bid. Continue amiodarone and metoprolol for HR control.   On Xarelto- f/u Dr Delton See, cardiology 9. Hypokalemia: Resolved  K+ 4.7 on 8/14    10. ABLA: . Monitor for signs of bleeding.   Hgb  9.6 on 8/6 13. Multiple Sclerosis: No medications? Pt /husband would like to f/u with local neurologist for MS  14. H/o Depression: On Effexor and prozac.  15. H/o CAD s/p PTCA: On Lipitor  16. Lethargy: Overall improved, now mainly due to effects of CVA  17. Am systolic BP elevation     -8/17, monitor, no medication changes- f/u with PCP as outpt, mainly a.m. elevations Vitals:   03/17/17 2000 03/18/17 0420  BP: (!) 117/56 (!) 161/58  Pulse: 61 67  Resp: 16 15  Temp:  98.6 F (37 C)  SpO2: 97% 96%   18. Hypoalbuminemia  Supplement initiated 7/28    LOS (Days) 24 A FACE TO FACE EVALUATION WAS PERFORMED  Valeree Leidy E 03/18/2017, 8:38 AM

## 2017-03-18 NOTE — Progress Notes (Signed)
Occupational Therapy Discharge Summary  Patient Details  Name: Kaitlin [name] MRN: [id number] Date of Birth: 05/10/1946   Patient has met 12 of 12 long term goals due to improved activity tolerance, improved balance, postural control, ability to compensate for deficits, functional use of  RIGHT upper extremity, improved attention, improved awareness and improved coordination.  Patient to discharge at [facility] Assist level.  Patient's care partner is independent to provide the necessary physical and cognitive assistance at discharge.  Pt's husband has been present throughout IPR admission and has completed hands on family education. He voices and demonstrates willing and ableness to provide all needed physical and cognitive assist. Have reviewed and practiced shower stall transfers, toilet transfers, and bathing/dressing routine Pt has demonstrated ability to complete ADLs at overall min A level, however, has tendency to ask for unnecessary assist during ADLs from husband. Pt's cognitive deficits are biggest barrier to independence as pt oriented to person only and requires max- total A for immediate recall. Pt's husband aware of physical and cognitive deficits and functional implications.    Recommendation:  Patient will benefit from ongoing skilled OT services in home health setting to continue to advance functional skills in the area of BADL and Reduce care partner burden.  Equipment: drop arm BSC, pt's husband private paying for shower chair  Reasons for discharge: treatment goals met and discharge from hospital  Patient/family agrees with progress made and goals achieved: Yes  OT Discharge Precautions/Restrictions  Precautions Precautions: Fall Precaution Comments: improving R inattention Restrictions Weight Bearing Restrictions: No General   Vital Signs Therapy Vitals Temp: 97.7 F (36.5 C) Temp Source: Oral Pulse Rate: (!) 59 Resp: 17 BP: (!) 139/50 Patient Position (if  appropriate): Sitting Oxygen Therapy SpO2: 100 % O2 Device: Not Delivered Pain Pain Assessment Pain Assessment: No/denies pain ADL   Vision Baseline Vision/History: Wears glasses Wears Glasses: At all times Patient Visual Report: No change from baseline Vision Assessment?: No apparent visual deficits Perception  Inattention/Neglect: Does not attend to right visual field Praxis Praxis: Impaired Praxis Impairment Details: Perseveration;Motor planning Cognition Overall Cognitive Status: Impaired/Different from baseline Arousal/Alertness: Awake/alert Orientation Level: Oriented to person;Disoriented to place;Disoriented to time;Disoriented to situation Attention: Selective Sustained Attention: Appears intact Sustained Attention Impairment: Verbal basic;Functional basic Selective Attention: Appears intact Memory: Impaired Memory Impairment: Decreased recall of new information;Decreased short term memory;Decreased long term memory;Storage deficit Decreased Long Term Memory: Verbal basic;Functional basic Decreased Short Term Memory: Verbal basic;Functional basic Awareness: Impaired Awareness Impairment: Intellectual impairment Problem Solving: Impaired Problem Solving Impairment: Verbal basic;Functional basic Executive Function:  (All levels impaired due to lower level deficits) Behaviors: Restless Safety/Judgment: Impaired Comments: decreaced awareness of deficits  Sensation Sensation Light Touch: Impaired Detail Light Touch Impaired Details: Impaired RUE (Pt reports dulled sensation in R UE, unable to elaborate) Proprioception: Impaired Detail Proprioception Impaired Details: Impaired RLE;Impaired RUE Coordination Gross Motor Movements are Fluid and Coordinated: No Fine Motor Movements are Fluid and Coordinated: No Coordination and Movement Description: R hemiparesis Finger Nose Finger Test: WFL L UE,  Unable to touch nose with R finger and decreased accuracy when  targeting to finger Motor  Motor Motor: Hemiplegia Motor - Discharge Observations: R hemiparesis UE>LE Mobility  Bed Mobility Bed Mobility: Supine to Sit;Sit to Supine Supine to Sit: 5: Supervision Sit to Supine: 5: Supervision Transfers Sit to Stand: 4: Min guard  Trunk/Postural Assessment  Cervical Assessment Cervical Assessment: Exceptions to [facility] (Forward head) Thoracic Assessment Thoracic Assessment: Exceptions to [facility] (Kyphotic) Lumbar Assessment Lumbar Assessment: Exceptions  to [facility] (Posterior pelvic tilt) Postural Control Postural Control: Deficits on evaluation (Delayed righting reactions)  Balance Balance Balance Assessed: Yes Static Sitting Balance Static Sitting - Balance Support: Feet supported;No upper extremity supported Static Sitting - Level of Assistance: 5: Stand by assistance Dynamic Sitting Balance Dynamic Sitting - Balance Support: During functional activity;Feet supported Dynamic Sitting - Level of Assistance: 4: Min assist Sitting balance - Comments: Sitting EOB to don shoes Static Standing Balance Static Standing - Balance Support: During functional activity Static Standing - Level of Assistance: 5: Stand by assistance;4: Min assist Static Standing - Comment/# of Minutes: Standing at sink to complete grooming tasks Dynamic Standing Balance Dynamic Standing - Balance Support: During functional activity Dynamic Standing - Level of Assistance: 4: Min assist Dynamic Standing - Comments: Standing for LB bathing/dressing Extremity/Trunk Assessment RUE Assessment RUE Assessment: Exceptions to [facility] RUE AROM (degrees) Overall AROM Right Upper Extremity: Within functional limits for tasks performed (Displays full range, however, impairments in functional use due to ataxia) RUE Strength RUE Overall Strength: Deficits (-4/5 throughout) LUE Assessment LUE Assessment: Within Functional Limits   See Function Navigator for Current Functional Status.  Bobby Rumpf,  Finlay Godbee C 03/18/2017, 3:39 PM

## 2017-03-18 NOTE — Discharge Instructions (Signed)
Inpatient Rehab Discharge Instructions  Kaitlin [name] Discharge date and time:  03/19/17  Activities/Precautions/ Functional Status: Activity: no lifting, driving, or strenuous exercise for till cleared by MD Diet: cardiac diet Wound Care: none needed    Functional status:  ___ No restrictions     ___ Walk up steps independently _X__ 24/7 supervision/assistance   ___ Walk up steps with assistance ___ Intermittent supervision/assistance  ___ Bathe/dress independently ___ Walk with walker     _X__ Bathe/dress with assistance ___ Walk Independently    ___ Shower independently _X__ Walk with assistance    ___ Shower with assistance _X__ No alcohol     ___ Return to work/school ________    COMMUNITY REFERRALS UPON DISCHARGE:   Home Health:   PT     OT     ST  Agency:  [facility] Phone:  [phone] Medical Equipment/Items Ordered:  16"x16" hemi-height, lightweight wheelchair with basic cushion and right half lap tray; rolling walker; drop arm commode  Agency/Supplier:  [facility]          Phone:  [phone]  GENERAL COMMUNITY RESOURCES FOR PATIENT/FAMILY: Support Groups:  [facility] Stroke Support Group                              Meets the 2nd Thursday of each month from 3-4PM (except June, July, and August)                              In the dayroom of the Inpatient Rehab Center at [facility] on [address]                              For more information, call [phone]  Special Instructions: 1. Offer fluids thorough out the day to keep well hydrated.    My questions have been answered and I understand these instructions. I will adhere to these goals and the provided educational materials after my discharge from the hospital.  Patient/Caregiver Signature _______________________________ Date __________  Clinician Signature _______________________________________ Date __________  Please bring this form and your medication list with you to  all your follow-up doctor's appointments.     STROKE/TIA DISCHARGE INSTRUCTIONS SMOKING Cigarette smoking nearly doubles your risk of having a stroke & is the single most alterable risk factor  If you smoke or have smoked in the last 12 months, you are advised to quit smoking for your health.  Most of the excess cardiovascular risk related to smoking disappears within a year of stopping.  Ask you doctor about anti-smoking medications  [place] Quit Line: 1-800-QUIT NOW  Free Smoking Cessation Classes [phone]  CHOLESTEROL Know your levels; limit fat & cholesterol in your diet  Lipid Panel     Component Value Date/Time   CHOL 173 02/15/2017 0420   TRIG 145 02/15/2017 0420   HDL 70 02/15/2017 0420   CHOLHDL 2.5 02/15/2017 0420   VLDL 29 02/15/2017 0420   LDLCALC 74 02/15/2017 0420      Many patients benefit from treatment even if their cholesterol is at goal.  Goal: Total Cholesterol (CHOL) less than 160  Goal:  Triglycerides (TRIG) less than 150  Goal:  HDL greater than 40  Goal:  LDL (LDLCALC) less than 100   BLOOD PRESSURE American Stroke Association blood pressure target is less  that 120/80 mm/Hg  Your discharge blood pressure is:  BP: (!) 122/47  Monitor your blood pressure  Limit your salt and alcohol intake  Many individuals will require more than one medication for high blood pressure  DIABETES (A1c is a blood sugar average for last 3 months) Goal HGBA1c is under 7% (HBGA1c is blood sugar average for last 3 months)  Diabetes: No known diagnosis of diabetes    Lab Results  Component Value Date   HGBA1C 5.2 02/15/2017     Your HGBA1c can be lowered with medications, healthy diet, and exercise.  Check your blood sugar as directed by your physician  Call your physician if you experience unexplained or low blood sugars.  PHYSICAL ACTIVITY/REHABILITATION Goal is 30 minutes at least 4 days per week  Activity: No driving, Therapies: see above Return to work:  N/A  Activity decreases your risk of heart attack and stroke and makes your heart stronger.  It helps control your weight and blood pressure; helps you relax and can improve your mood.  Participate in a regular exercise program.  Talk with your doctor about the best form of exercise for you (dancing, walking, swimming, cycling).  DIET/WEIGHT Goal is to maintain a healthy weight  Your discharge diet is: Diet regular Room service appropriate? Yes; Fluid consistency: Thin  liquids Your height is:   Your current weight is: Weight: 53.5 kg (118 lb) Your Body Mass Index (BMI) is:    Following the type of diet specifically designed for you will help prevent another stroke.  You are at goal weight   Your goal Body Mass Index (BMI) is 19-24.  Healthy food habits can help reduce 3 risk factors for stroke:  High cholesterol, hypertension, and excess weight.  RESOURCES Stroke/Support Group:  Call [phone]   STROKE EDUCATION PROVIDED/REVIEWED AND GIVEN TO PATIENT Stroke warning signs and symptoms How to activate emergency medical system (call 911). Medications prescribed at discharge. Need for follow-up after discharge. Personal risk factors for stroke. Pneumonia vaccine given:  Flu vaccine given:  My questions have been answered, the writing is legible, and I understand these instructions.  I will adhere to these goals & educational materials that have been provided to me after my discharge from the hospital.     Information on my medicine - XARELTO (Rivaroxaban)  This medication education was reviewed with me or my healthcare representative as part of my discharge preparation.  The pharmacist that spoke with me during my hospital stay was:  Jacquelynn Cree, PA-C  Why was Xarelto prescribed for you? Xarelto was prescribed for you to reduce the risk of a blood clot forming that can cause a stroke if you have a medical condition called atrial fibrillation (a type of irregular  heartbeat).  What do you need to know about xarelto ? Take your Xarelto ONCE DAILY at the same time every day with your evening meal. If you have difficulty swallowing the tablet whole, you may crush it and mix in applesauce just prior to taking your dose.  Take Xarelto exactly as prescribed by your doctor and DO NOT stop taking Xarelto without talking to the doctor who prescribed the medication.  Stopping without other stroke prevention medication to take the place of Xarelto may increase your risk of developing a clot that causes a stroke.  Refill your prescription before you run out.  After discharge, you should have regular check-up appointments with your healthcare provider that is prescribing your Xarelto.  In the future  your dose may need to be changed if your kidney function or weight changes by a significant amount.  What do you do if you miss a dose? If you are taking Xarelto ONCE DAILY and you miss a dose, take it as soon as you remember on the same day then continue your regularly scheduled once daily regimen the next day. Do not take two doses of Xarelto at the same time or on the same day.   Important Safety Information A possible side effect of Xarelto is bleeding. You should call your healthcare provider right away if you experience any of the following: ? Bleeding from an injury or your nose that does not stop. ? Unusual colored urine (red or dark brown) or unusual colored stools (red or black). ? Unusual bruising for unknown reasons. ? A serious fall or if you hit your head (even if there is no bleeding).  Some medicines may interact with Xarelto and might increase your risk of bleeding while on Xarelto. To help avoid this, consult your healthcare provider or pharmacist prior to using any new prescription or non-prescription medications, including herbals, vitamins, non-steroidal anti-inflammatory drugs (NSAIDs) and supplements.  This website has more information on  Xarelto: [email or web address].

## 2017-03-19 NOTE — Progress Notes (Signed)
Subjective/Complaints:  Pt pleased to be going home today. Understands dc instructions  ROS: pt denies nausea, vomiting, diarrhea, cough, shortness of breath or chest pain   Objective: Vital Signs: Blood pressure (!) 123/44, pulse (!) 59, temperature 98.5 F (36.9 C), temperature source Oral, resp. rate 18, height 5\' 2"  (1.575 m), weight 53.5 kg (118 lb), SpO2 95 %. No results found. No results found for this or any previous visit (from the past 72 hour(s)).   HEENT: Normocephalic, atraumatic. Cardio: RRR without murmur. No JVD    Pulm: CTA Bilaterally without wheezes or rales. Normal effort   Neuro: alert  Motor: 3-/5 RUE--stable   3-/5 RLE-stable Left upper extremity. Left lower extremity 4+/5 Dysarthria Musc/Skel:  No tenderness, no edema Gen NAD. Vital signs reivewed Orientation to person, "[place]"  Assessment/Plan: 1. Functional deficits secondary to Left MCA infarct which require 3+ hours per day of interdisciplinary therapy in a comprehensive inpatient rehab setting. Physiatrist is providing close team supervision and 24 hour management of active medical problems listed below. Physiatrist and rehab team continue to assess barriers to discharge/monitor patient progress toward functional and medical goals. FIM: Function - Bathing Position: Shower Body parts bathed by patient: Chest, Abdomen, Left upper leg, Front perineal area, Right arm, Left arm, Right upper leg, Buttocks, Right lower leg, Left lower leg Body parts bathed by helper: Back Assist Level: Touching or steadying assistance(Pt > 75%)  Function- Upper Body Dressing/Undressing What is the patient wearing?: Bra, Pull over shirt/dress Bra - Perfomed by patient: Thread/unthread right bra strap, Thread/unthread left bra strap Bra - Perfomed by helper: Hook/unhook bra (pull down sports bra) Pull over shirt/dress - Perfomed by patient: Thread/unthread left sleeve, Put head through opening, Thread/unthread right  sleeve, Pull shirt over trunk Pull over shirt/dress - Perfomed by helper: Pull shirt over trunk Assist Level: Touching or steadying assistance(Pt > 75%) Function - Lower Body Dressing/Undressing What is the patient wearing?: Pants, Shoes, Socks, Underwear, AFO Position: Wheelchair/chair at sink Underwear - Performed by patient: Thread/unthread right underwear leg, Thread/unthread left underwear leg, Pull underwear up/down Pants- Performed by patient: Thread/unthread right pants leg, Thread/unthread left pants leg, Pull pants up/down Pants- Performed by helper: Fasten/unfasten pants Non-skid slipper socks- Performed by patient: Don/doff right sock, Don/doff left sock Non-skid slipper socks- Performed by helper: Don/doff right sock, Don/doff left sock Socks - Performed by patient: Don/doff right sock, Don/doff left sock Socks - Performed by helper: Don/doff right sock, Don/doff left sock Shoes - Performed by patient: Don/doff left shoe Shoes - Performed by helper: Don/doff right shoe, Fasten left, Fasten right AFO - Performed by helper: Don/doff right AFO Assist for footwear: Partial/moderate assist (Assist required for AFO) Assist for lower body dressing: Touching or steadying assistance (Pt > 75%)  Function - Toileting Toileting activity did not occur: No continent bowel/bladder event Toileting steps completed by patient: Adjust clothing prior to toileting, Performs perineal hygiene, Adjust clothing after toileting Toileting steps completed by helper: Adjust clothing prior to toileting, Performs perineal hygiene, Adjust clothing after toileting Toileting Assistive Devices: Grab bar or rail Assist level: Touching or steadying assistance (Pt.75%)  Function - Archivist transfer assistive device: Grab bar Assist level to toilet: Touching or steadying assistance (Pt > 75%) Assist level from toilet: Touching or steadying assistance (Pt > 75%) Assist level to bedside commode (at  bedside): Touching or steadying assistance (Pt > 75%) Assist level from bedside commode (at bedside): Touching or steadying assistance (Pt > 75%)  Function - Chair/bed transfer  Chair/bed transfer method: Stand pivot Chair/bed transfer assist level: Touching or steadying assistance (Pt > 75%) Chair/bed transfer assistive device: Armrests, Walker Chair/bed transfer details: Verbal cues for technique, Verbal cues for precautions/safety  Function - Locomotion: Wheelchair Will patient use wheelchair at discharge?: Yes Type: Manual Max wheelchair distance: 150 Assist Level: Supervision or verbal cues Assist Level: Supervision or verbal cues Wheel 150 feet activity did not occur: Safety/medical concerns Assist Level: Supervision or verbal cues Turns around,maneuvers to table,bed, and toilet,negotiates 3% grade,maneuvers on rugs and over doorsills: No Function - Locomotion: Ambulation Assistive device: Orthosis, Walker-rolling Max distance: 150 Assist level: Touching or steadying assistance (Pt > 75%) Assist level: Touching or steadying assistance (Pt > 75%) Walk 50 feet with 2 turns activity did not occur: Safety/medical concerns Assist level: Touching or steadying assistance (Pt > 75%) Walk 150 feet activity did not occur: Safety/medical concerns Assist level: Touching or steadying assistance (Pt > 75%) Walk 10 feet on uneven surfaces activity did not occur: Safety/medical concerns  Function - Comprehension Comprehension: Auditory Comprehension assist level: Understands basic 50 - 74% of the time/ requires cueing 25 - 49% of the time  Function - Expression Expression: Verbal Expression assist level: Expresses basic 50 - 74% of the time/requires cueing 25 - 49% of the time. Needs to repeat parts of sentences.  Function - Social Interaction Social Interaction assist level: Interacts appropriately 50 - 74% of the time - May be physically or verbally inappropriate.  Function - Problem  Solving Problem solving assist level: Solves basic 50 - 74% of the time/requires cueing 25 - 49% of the time  Function - Memory Memory assist level: Recognizes or recalls 50 - 74% of the time/requires cueing 25 - 49% of the time Patient normally able to recall (first 3 days only): None of the above  Medical Problem List and Plan: 1.  Right hemiparesis and language deficits secondary to left MCA infarct  Cont CIR, PT,OT,SLP,   -Patient to see Rehab MD in the office for transitional care encounter in 1-2 weeks.   2.  DVT Prophylaxis/Anticoagulation: Pharmaceutical: Xarelto and Plavix, high risk of bleeding, monitor 3. Pain Management: tylenol prn 4. Mood: LCSW to follow for evaluation and support. Sleep/wake cycle  5. Neuropsych: This patient is not capable of making decisions on her own behalf. 6. Skin/Wound Care: Routine pressure relief measures.  7. Fluids/Electrolytes/Nutrition: Monitor I/Os.  Normal BUN and creatinine on 8/14, continue to encourage fluids 8. A fib: Monitor HR bid. Continue amiodarone and metoprolol for HR control.   On Xarelto- f/u Dr Delton See, cardiology 9. Hypokalemia: Resolved  K+ 4.7 on 8/14    10. ABLA: . Monitor for signs of bleeding.   Hgb  9.6 on 8/6 13. Multiple Sclerosis: No medications? Pt /husband would like to f/u with local neurologist for MS  14. H/o Depression: On Effexor and prozac.  15. H/o CAD s/p PTCA: On Lipitor  16. Lethargy: Overall improved, now mainly due to effects of CVA  17. Am systolic BP elevation     -8/17, monitor, no medication changes- f/u with PCP as outpt, mainly a.m. elevations Vitals:   03/18/17 1353 03/19/17 0558  BP: (!) 139/50 (!) 123/44  Pulse: (!) 59 (!) 59  Resp: 17 18  Temp: 97.7 F (36.5 C) 98.5 F (36.9 C)  SpO2: 100% 95%   18. Hypoalbuminemia  Supplement initiated 7/28    LOS (Days) 25 A FACE TO FACE EVALUATION WAS PERFORMED  SWARTZ,ZACHARY T 03/19/2017, 10:02 AM

## 2017-03-19 NOTE — Progress Notes (Signed)
Patient discharged. Nurse tech assisted patient and spouse to lobby. Belongings in their possession. Both deny questions regarding discharge instructions.

## 2017-03-19 NOTE — Progress Notes (Signed)
Patient discharged. Nurse tech assisted patient and her spouse to lobby with belongings. Patient and spouse deny questions regarding discharge instructions.

## 2017-03-21 ENCOUNTER — Telehealth: Payer: Self-pay

## 2017-03-21 ENCOUNTER — Telehealth (HOSPITAL_COMMUNITY): Payer: Self-pay

## 2017-03-21 NOTE — Telephone Encounter (Signed)
Transitional Care call  Patient name: Kaitlin [name]) DOB: ([birth year]) 1. Are you/is patient experiencing any problems since coming home? (YES/ NOSEBLEEDS) a. Are there any questions regarding any aspect of care? (NO) 2. Are there any questions regarding medications administration/dosing? (NO) a. Are meds being taken as prescribed? (YES) b. "Patient should review meds with caller to confirm"  3. Have there been any falls? (NO) 4. Has Home Health been to the house and/or have they contacted you? (YES) a. If not, have you tried to contact them? (NO) b. Can we help you contact them? (NO) 5. Are bowels and bladder emptying properly? (YES) a. Are there any unexpected incontinence issues? (NO) b. If applicable, is patient following bowel/bladder programs? (NA) 6. Any fevers, problems with breathing, unexpected pain? (NO) 7. Are there any skin problems or new areas of breakdown? (NO) 8. Has the patient/family member arranged specialty MD follow up (ie cardiology/neurology/renal/surgical/etc.)?  (YES) a. Can we help arrange? (NO) 9. Does the patient need any other services or support that we can help arrange? (NO) 10. Are caregivers following through as expected in assisting the patient? (YES) 11. Has the patient quit smoking, drinking alcohol, or using drugs as recommended? (NA)  Appointment date/time (03-31-2017 / 1230PM), arrive time ([id number]) and who it is with here (DR. Doroteo Bradford) [address] with Dr. Doroteo Bradford about patients nose bleeds, states it may be caused by patients blood thinner medicine, was advised that if the nose bleed starts and is not able to be controlled, to go to ER for treatment, message relayed to patients husband

## 2017-03-21 NOTE — Telephone Encounter (Signed)
Called to schedule f/u, no answer, phone kept ringing busy. AW

## 2017-03-31 ENCOUNTER — Ambulatory Visit (HOSPITAL_BASED_OUTPATIENT_CLINIC_OR_DEPARTMENT_OTHER): Payer: Medicare Other | Admitting: Physical Medicine & Rehabilitation

## 2017-03-31 ENCOUNTER — Encounter: Payer: Medicare Other | Attending: Physical Medicine & Rehabilitation

## 2017-03-31 ENCOUNTER — Encounter: Payer: Self-pay | Admitting: Physical Medicine & Rehabilitation

## 2017-03-31 VITALS — BP 122/67 | HR 56 | Resp 16

## 2017-03-31 DIAGNOSIS — I63512 Cerebral infarction due to unspecified occlusion or stenosis of left middle cerebral artery: Secondary | ICD-10-CM | POA: Insufficient documentation

## 2017-03-31 DIAGNOSIS — I69319 Unspecified symptoms and signs involving cognitive functions following cerebral infarction: Secondary | ICD-10-CM | POA: Diagnosis not present

## 2017-03-31 DIAGNOSIS — R269 Unspecified abnormalities of gait and mobility: Secondary | ICD-10-CM

## 2017-03-31 DIAGNOSIS — I69322 Dysarthria following cerebral infarction: Secondary | ICD-10-CM

## 2017-03-31 DIAGNOSIS — I69398 Other sequelae of cerebral infarction: Secondary | ICD-10-CM

## 2017-03-31 DIAGNOSIS — I69351 Hemiplegia and hemiparesis following cerebral infarction affecting right dominant side: Secondary | ICD-10-CM

## 2017-03-31 NOTE — Patient Instructions (Signed)
Please call me if home health runs out of visits him a plan on transitioning to outpatient therapy. PT, OT, speech

## 2017-03-31 NOTE — Progress Notes (Signed)
Subjective:    Patient ID: Kaitlin [name], female    DOB: 07-Dec-1945, 71 y.o.   MRN: [id number] 71 y.o.female with history of CAD s/p PTCA, MS with gait disorder/memory loss, PAF who was admitted on 02/14/17 with reports of speech difficulty progressing to right sided weakness later that day. CTA head showed evidence of infarct left insular and left posterior putamen with occlusion of L-CCA and L-ICA from arch to distal cavernous segment and incidental finding of centrilobar emphysema in lung apices. She underwent cerebral angio with complete revascularization of occluded L-MCA with one pass Solitaire and stent assisted angioplasty of proximal L-ICA with IA integrelin. Follow up MRI brain done revealing acute confluent infarct involving the left putamen, caudate body, and corona radiata, 2 tiny cortical infarcts in the left MCA distribution and extensive white matter disease due to MS and chronic microvascular ischemia. She developed A fib with RVR requiring amiodarone and esmolol. Dr. Delton See recommended Eliquis bid for A fib (as likely cause of stroke) and changing Brilinta to Plavix HPI  Nosebleed since d/c, may have scratched her nose has resolved HHPT, OT, SLP and RN have been out. No falls Needs help with hooking bra, otherwise Mod I walking, Husband helps her to the shower, but she showers herself. Patient has been visiting with friends who have been coming over 4 wheel walker arrived today Seen by PCP Dr Felipa Eth last Friday Does not have appt with IR Appt scheduled with Dr Delton See cardioplogy Has appt with Neuro next month  No pain  Pain Inventory Average Pain 0 Pain Right Now 0 My pain is no pain  In the last 24 hours, has pain interfered with the following? General activity no pain Relation with others 0 Enjoyment of life 0 What TIME of day is your pain at its worst? no pain Sleep (in general) NA  Pain is worse with: no pain Pain improves with: no pain Relief from Meds: no  pain  Mobility use a wheelchair needs help with transfers Do you have any goals in this area?  yes  Function retired I need assistance with the following:  dressing, bathing, toileting, meal prep, household duties and shopping Do you have any goals in this area?  yes  Neuro/Psych bladder control problems trouble walking confusion  Prior Studies Any changes since last visit?  no  Physicians involved in your care Any changes since last visit?  no   Family History  Problem Relation Age of Onset  . Heart disease Father   . Cancer Father   . Multiple sclerosis Sister   . Cancer Mother    Social History   Social History  . Marital status: Married    Spouse name: N/A  . Number of children: N/A  . Years of education: N/A   Social History Main Topics  . Smoking status: Never Smoker  . Smokeless tobacco: Never Used  . Alcohol use 1.8 oz/week    3 Glasses of wine per week  . Drug use: No  . Sexual activity: Not Asked   Other Topics Concern  . None   Social History Narrative  . None   Past Surgical History:  Procedure Laterality Date  . CORONARY ANGIOPLASTY WITH STENT PLACEMENT  11/1999   [facility]  . IR ANGIO INTRA EXTRACRAN SEL COM CAROTID INNOMINATE UNI R MOD SED  02/15/2017  . IR ANGIO VERTEBRAL SEL SUBCLAVIAN INNOMINATE UNI R MOD SED  02/15/2017  . IR INTRAVSC STENT CERV CAROTID W/O EMB-PROT MOD SED  INC ANGIO  02/15/2017  . IR PERCUTANEOUS ART THROMBECTOMY/INFUSION INTRACRANIAL INC DIAG ANGIO  02/15/2017  . NECK SURGERY    . RADIOLOGY WITH ANESTHESIA N/A 02/14/2017   Procedure: RADIOLOGY WITH ANESTHESIA;  Surgeon: Radiologist, Medication, MD;  Location: [facility] OR;  Service: Radiology;  Laterality: N/A;  . WRIST SURGERY     Past Medical History:  Diagnosis Date  . CAD S/P percutaneous coronary angioplasty 2001  . CVA (cerebral vascular accident) (HCC) 01/2017  . HCVD (hypertensive cardiovascular disease) 01/2017   normal LVF with moderate LVH, grade 1 DD    . High cholesterol   . Multiple sclerosis (HCC)   . PAF (paroxysmal atrial fibrillation) (HCC) 01/2017   BP 122/67   Pulse (!) 56   Resp 16   SpO2 98%   Opioid Risk Score:   Fall Risk Score:  `1  Depression screen PHQ 2/9  Depression screen PHQ 2/9 03/31/2017  Decreased Interest 0  Down, Depressed, Hopeless 0  PHQ - 2 Score 0  Altered sleeping 0  Tired, decreased energy 0  Change in appetite 0  Feeling bad or failure about yourself  0  Trouble concentrating 0  Moving slowly or fidgety/restless 0  Suicidal thoughts 0  PHQ-9 Score 0  Difficult doing work/chores Not difficult at all      Review of Systems  Constitutional: Negative.   HENT: Negative.   Eyes: Negative.   Respiratory: Negative.   Cardiovascular: Negative.   Gastrointestinal: Negative.   Endocrine: Negative.   Genitourinary: Negative.   Musculoskeletal: Negative.   Skin: Negative.   Allergic/Immunologic: Negative.   Neurological: Negative.   Hematological: Bruises/bleeds easily.  Psychiatric/Behavioral: Negative.   All other systems reviewed and are negative.      Objective:   Physical Exam  Constitutional: She is oriented to person, place, and time. She appears well-developed and well-nourished.  HENT:  Head: Normocephalic and atraumatic.  Eyes: Pupils are equal, round, and reactive to light. Conjunctivae and EOM are normal.  Neck: Normal range of motion. No JVD present.  Cardiovascular: Normal rate and regular rhythm.  Exam reveals no friction rub.   No murmur heard. Pulmonary/Chest: Effort normal and breath sounds normal. No stridor. No respiratory distress. She has no wheezes.  Abdominal: Soft. Bowel sounds are normal. She exhibits no distension. There is no tenderness.  Neurological: She is alert and oriented to person, place, and time. Coordination normal.  Motor strength is 3/5 in the right deltoid, biceps, triceps, grip, 4 minus at the right hip flexor, knee extensor, right ankle not  tested secondary to AFO. Left side is 5/5 in the deltoid, biceps, triceps, grip, hip flexor, knee extensor, ankle dorsiflexor, plantar flexor. Sensation difficult to assess secondary to aphasia. However, she does not identify a difference between light touch on the right hand compared to the left hand  Extraocular muscles are intact. No evidence of nystagmus, denies diplopia  Patient has sparse expressive language, she speaks at a word and short phrase level, moderate dysarthria  Psychiatric: She has a normal mood and affect.  Nursing note and vitals reviewed.  Oriented to person place not time        Assessment & Plan:  1. Embolic left ICA/MCA infarct, status post angioplasty and arterial Integrilin injection Excellent improvements in terms of her motor function as well as speech. Her swallow has recovered as well.  We'll continue home health PT, OT, speech, planned transition to outpatient therapy  Medical follow-up Dr. Felipa Eth Neurology follow-up Dr. Pearlean Brownie who  can also coordinate follow-up with Dr. Corliss Skains Cardiology follow-up Dr. Delton See  Physical medicine and rehabilitation follow-up 1 month

## 2017-04-11 DIAGNOSIS — I10 Essential (primary) hypertension: Secondary | ICD-10-CM | POA: Insufficient documentation

## 2017-04-11 NOTE — Progress Notes (Signed)
Cardiology Office Note    Date:  04/12/2017   ID:  [name], DOB Mar 10, 1946, MRN [id number]  PCP:  Chilton Greathouse, MD  Cardiologist: Dr. Delton See  Chief Complaint  Patient presents with  . Follow-up    History of Present Illness:  Kaitlin [name] is a 71 y.o. female a PMH significant for CAD, s/p remote RCA PCI 2001, HTN, HLD, and stable multiple sclerosis. She presented to [facility] 02/15/17 with a L MCA occlusion s/p LMCA PTA and LICA PTA/stenting. While on telemetry she had documented PAF and anticoagulation was initiated. She remained on Plavix and Xarelto.CHADSVASC=5. She was maintaining normal sinus rhythm on amiodarone and beta blocker. 2-D echo LVEF 65-70% with mild AS, no cardiac source of emboli. Patient was transferred to cardiac rehabilitation. Patient was discharged from inpatient rehabilitation 03/19/17.   Patient comes in today accompanied by her husband. She's had 2 or 3 episodes where she became short of breath when she first stood up. She is working with rehabilitation and can walk and exercise without any support breath, chest tightness, palpitations or cardiac complaints. The therapist checked her blood pressure sitting and standing yesterday there was no change. EKG does show new T-wave inversion when compared to EKGs in 2013. They  Are similar to when she was in the hospital.       Past Medical History:  Diagnosis Date  . Abnormality of gait as late effect of stroke 02/23/2017  . Acute hypoxemic respiratory failure (HCC)   . Atrial fibrillation with RVR (HCC) 02/17/2017  . CAD S/P percutaneous coronary angioplasty 2001  . CVA (cerebral vascular accident) (HCC) 01/2017  . Dysphagia as late effect of stroke 02/23/2017  . HCVD (hypertensive cardiovascular disease) 01/2017   normal LVF with moderate LVH, grade 1 DD  . High cholesterol   . Hypokalemia   . Labile blood pressure   . Leukocytosis   . Multiple sclerosis (HCC)   . PAF (paroxysmal atrial fibrillation) (HCC)  01/2017  . Right hemiparesis [facility])     Past Surgical History:  Procedure Laterality Date  . CORONARY ANGIOPLASTY WITH STENT PLACEMENT  11/1999   RCA DES-[facility]  . IR ANGIO INTRA EXTRACRAN SEL COM CAROTID INNOMINATE UNI R MOD SED  02/15/2017  . IR ANGIO VERTEBRAL SEL SUBCLAVIAN INNOMINATE UNI R MOD SED  02/15/2017  . IR INTRAVSC STENT CERV CAROTID W/O EMB-PROT MOD SED INC ANGIO  02/15/2017  . IR PERCUTANEOUS ART THROMBECTOMY/INFUSION INTRACRANIAL INC DIAG ANGIO  02/15/2017  . NECK SURGERY    . RADIOLOGY WITH ANESTHESIA N/A 02/14/2017   Procedure: RADIOLOGY WITH ANESTHESIA;  Surgeon: Radiologist, Medication, MD;  Location: [facility] OR;  Service: Radiology;  Laterality: N/A;  . WRIST SURGERY      Current Medications: Current Meds  Medication Sig  . amiodarone (PACERONE) 200 MG tablet Take 1 tablet (200 mg total) by mouth daily.  Marland Kitchen atorvastatin (LIPITOR) 40 MG tablet Take 1 tablet (40 mg total) by mouth daily at 6 PM.  . Calcium Citrate-Vitamin D (CALCIUM CITRATE + D PO) Take 1 tablet by mouth daily.  . clopidogrel (PLAVIX) 75 MG tablet Take 1 tablet (75 mg total) by mouth daily.  Marland Kitchen FLUoxetine (PROZAC) 10 MG capsule Take 1 capsule (10 mg total) by mouth daily.  . metoprolol tartrate (LOPRESSOR) 50 MG tablet Take 1 tablet (50 mg total) by mouth 2 (two) times daily.  . Multiple Vitamins-Minerals (PRESERVISION AREDS PO) Take 1 tablet by mouth 2 (two) times daily.  . pantoprazole (PROTONIX) 40 MG  tablet Take 1 tablet (40 mg total) by mouth daily.  . Rivaroxaban (XARELTO) 15 MG TABS tablet Take 1 tablet (15 mg total) by mouth daily with supper.  . venlafaxine XR (EFFEXOR-XR) 75 MG 24 hr capsule Take 1 capsule (75 mg total) by mouth daily.  . [DISCONTINUED] traZODone (DESYREL) 50 MG tablet Take 1 tablet (50 mg total) by mouth at bedtime.     Allergies:   Eliquis [apixaban] and Penicillins   Social History   Social History  . Marital status: Married    Spouse name: N/A  . Number of children: N/A    . Years of education: N/A   Social History Main Topics  . Smoking status: Never Smoker  . Smokeless tobacco: Never Used  . Alcohol use 1.8 oz/week    3 Glasses of wine per week  . Drug use: No  . Sexual activity: Not Asked   Other Topics Concern  . None   Social History Narrative  . None     Family History:  The patient's family history includes Cancer in her father and mother; Heart disease in her father; Multiple sclerosis in her sister.   ROS:   Please see the history of present illness.    Review of Systems  Constitution: Negative.  HENT: Negative.   Eyes: Negative.   Cardiovascular: Negative.   Respiratory: Negative.   Hematologic/Lymphatic: Negative.   Musculoskeletal: Negative.  Negative for joint pain.  Gastrointestinal: Negative.   Genitourinary: Negative.   Neurological: Positive for difficulty with concentration, disturbances in coordination and loss of balance.  Psychiatric/Behavioral: Positive for memory loss.   All other systems reviewed and are negative.   PHYSICAL EXAM:   VS:  BP (!) 130/58   Pulse 61   Ht  (1.575 m)   Wt 117 lb 6.4 oz (53.3 kg)   SpO2 95%   BMI 21.47 kg/m   Physical Exam  GEN: Well nourished, well developed, in no acute distress  Neck: Bilateral carotid bruits no JVD,or masses Cardiac:RRR; 2/6 systolic murmur at the left sternal border Respiratory:  clear to auscultation bilaterally, normal work of breathing GI: soft, nontender, nondistended, + BS Ext: Small lesion where she bled anterior tibia from bumping it otherwise lower extremities without cyanosis, clubbing, or edema, Good distal pulses bilaterally Neuro:  Alert and Oriented x 3 Psych: euthymic mood, full affect  Wt Readings from Last 3 Encounters:  04/12/17 117 lb 6.4 oz (53.3 kg)  03/06/17 118 lb (53.5 kg)  02/16/17 131 lb 6.3 oz (59.6 kg)      Studies/Labs Reviewed:   EKG:  EKG is  ordered today.  The ekg ordered today demonstrates Normal sinus rhythm  with T wave inversion inferior, anterior lateral changed from 2013 but similar to the hospital  Recent Labs: 02/18/2017: Magnesium 2.1 02/23/2017: ALT 81 03/15/2017: BUN 19; Creatinine, Ser 0.80; Hemoglobin 9.6; Platelets 247; Potassium 4.7; Sodium 137   Lipid Panel    Component Value Date/Time   CHOL 173 02/15/2017 0420   TRIG 145 02/15/2017 0420   HDL 70 02/15/2017 0420   CHOLHDL 2.5 02/15/2017 0420   VLDL 29 02/15/2017 0420   LDLCALC 74 02/15/2017 0420    Additional studies/ records that were reviewed today include:   Echo 02/16/17- Study Conclusions   - Left ventricle: The cavity size was normal. There was moderate   concentric hypertrophy. Systolic function was vigorous. The   estimated ejection fraction was in the range of 65% to 70%. Wall  motion was normal; there were no regional wall motion   abnormalities. Doppler parameters are consistent with abnormal   left ventricular relaxation (grade 1 diastolic dysfunction).   Doppler parameters are consistent with elevated ventricular   end-diastolic filling pressure. - Aortic valve: Trileaflet; mildly thickened, mildly calcified   leaflets. There was mild stenosis. Mean gradient (S): 11 mm Hg.   Peak gradient (S): 17 mm Hg. Valve area (VTI): 1.45 cm^2. Valve   area (Vmax): 1.73 cm^2. Valve area (Vmean): 1.44 cm^2. - Mitral valve: Calcified annulus. Mildly thickened leaflets .   There was trivial regurgitation. - Left atrium: The atrium was normal in size. - Right ventricle: The cavity size was normal. Wall thickness was   normal. Systolic function was normal. - Right atrium: The atrium was normal in size. - Tricuspid valve: There was trivial regurgitation. - Pulmonary arteries: Systolic pressure was within the normal   range. - Inferior vena cava: The vessel was normal in size. - Pericardium, extracardiac: There was no pericardial effusion.   Impressions:   - No cardiac source of emboli was indentified.      ASSESSMENT:    1. Cerebrovascular accident (CVA) due to thrombosis of precerebral artery (HCC)   2. Paroxysmal A-fib (HCC)   3. Essential hypertension   4. Mixed hyperlipidemia   5. Coronary artery disease involving native coronary artery of native heart without angina pectoris      PLAN:  In order of problems listed above:  LMCA infarct:  S/p LMCA PTA and LICA PTA/stenting on 7/17.  She remains on plavix and xarelto (in the setting of afib).  Doing well with rehabilitation  PAF on amiodarone and Xarelto maintaining normal sinus rhythm  Hypertension had been labile in the hospital but now stable  Hyperlipidemia on Lipitor  CAD with remote PCI to the RCA in 2001 with diffuse T-wave inversion on EKG similar to the hospital but new since 2013. Patient without cardiac symptoms. Told to call if she has any chest pain or shortness of breath. With recent stroke, MS and lack of cardiac symptoms. Not do further cardiac workup at this time but will follow closely. Follow-up with Dr. Delton See in 2-3 months.    Medication Adjustments/Labs and Tests Ordered: Current medicines are reviewed at length with the patient today.  Concerns regarding medicines are outlined above.  Medication changes, Labs and Tests ordered today are listed in the Patient Instructions below. Patient Instructions  Medication Instructions:  Your physician recommends that you continue on your current medications as directed. Please refer to the Current Medication list given to you today.   Labwork: None ordered  Testing/Procedures: None ordered  Follow-Up: Your physician recommends that you schedule a follow-up appointment in: 2-3 MONTHS WITH DR. Delton See   Any Other Special Instructions Will Be Listed Below (If Applicable).     If you need a refill on your cardiac medications before your next appointment, please call your pharmacy.      Elson Clan, PA-C  04/12/2017 8:42 AM    [facility] [address] [place], [place], [place]  [address] Phone: [phone]; Fax: [phone]

## 2017-04-12 ENCOUNTER — Encounter: Payer: Self-pay | Admitting: Physician Assistant

## 2017-04-12 ENCOUNTER — Ambulatory Visit (INDEPENDENT_AMBULATORY_CARE_PROVIDER_SITE_OTHER): Payer: Medicare Other | Admitting: Physician Assistant

## 2017-04-12 VITALS — BP 130/58 | HR 61 | Ht 62.0 in | Wt 117.4 lb

## 2017-04-12 DIAGNOSIS — I2510 Atherosclerotic heart disease of native coronary artery without angina pectoris: Secondary | ICD-10-CM | POA: Insufficient documentation

## 2017-04-12 DIAGNOSIS — I6300 Cerebral infarction due to thrombosis of unspecified precerebral artery: Secondary | ICD-10-CM

## 2017-04-12 DIAGNOSIS — I480 Paroxysmal atrial fibrillation: Secondary | ICD-10-CM | POA: Diagnosis not present

## 2017-04-12 DIAGNOSIS — E782 Mixed hyperlipidemia: Secondary | ICD-10-CM

## 2017-04-12 DIAGNOSIS — I10 Essential (primary) hypertension: Secondary | ICD-10-CM

## 2017-04-12 NOTE — Patient Instructions (Signed)
Medication Instructions:  Your physician recommends that you continue on your current medications as directed. Please refer to the Current Medication list given to you today.   Labwork: None ordered  Testing/Procedures: None ordered  Follow-Up: Your physician recommends that you schedule a follow-up appointment in: 2-3 MONTHS WITH DR. NELSON   Any Other Special Instructions Will Be Listed Below (If Applicable).     If you need a refill on your cardiac medications before your next appointment, please call your pharmacy.   

## 2017-04-13 ENCOUNTER — Telehealth: Payer: Self-pay | Admitting: *Deleted

## 2017-04-13 DIAGNOSIS — R269 Unspecified abnormalities of gait and mobility: Secondary | ICD-10-CM

## 2017-04-13 DIAGNOSIS — I69319 Unspecified symptoms and signs involving cognitive functions following cerebral infarction: Secondary | ICD-10-CM

## 2017-04-13 DIAGNOSIS — I69398 Other sequelae of cerebral infarction: Secondary | ICD-10-CM

## 2017-04-13 DIAGNOSIS — I69322 Dysarthria following cerebral infarction: Secondary | ICD-10-CM

## 2017-04-13 DIAGNOSIS — I69351 Hemiplegia and hemiparesis following cerebral infarction affecting right dominant side: Secondary | ICD-10-CM

## 2017-04-13 NOTE — Telephone Encounter (Signed)
[facility] called and they are discharging to day and OT/ST will be out by Friday.  They request she be set up with outpt neuro rehab. Referrals placed. I tried to notify husband but number busy.  I let Tomma Lightning know referrals placed  to outpt neuro rehab. [phone])

## 2017-04-20 ENCOUNTER — Ambulatory Visit (HOSPITAL_COMMUNITY)
Admission: EM | Admit: 2017-04-20 | Discharge: 2017-04-20 | Disposition: A | Discharge status: Home / Self Care | Payer: Medicare Other | Attending: Emergency Medicine | Admitting: Emergency Medicine

## 2017-04-20 ENCOUNTER — Encounter (HOSPITAL_COMMUNITY): Payer: Self-pay | Admitting: *Deleted

## 2017-04-20 DIAGNOSIS — T148XXA Other injury of unspecified body region, initial encounter: Secondary | ICD-10-CM

## 2017-04-20 DIAGNOSIS — Z23 Encounter for immunization: Secondary | ICD-10-CM | POA: Diagnosis not present

## 2017-04-20 DIAGNOSIS — W268XXA Contact with other sharp object(s), not elsewhere classified, initial encounter: Secondary | ICD-10-CM

## 2017-04-20 DIAGNOSIS — S51811A Laceration without foreign body of right forearm, initial encounter: Secondary | ICD-10-CM | POA: Diagnosis not present

## 2017-04-20 MED ORDER — SILVER NITRATE-POT NITRATE 75-25 % EX MISC: 1.0000 | Freq: Once | CUTANEOUS | Status: DC

## 2017-04-20 MED ORDER — TETANUS-DIPHTH-ACELL PERTUSSIS 5-2.5-18.5 LF-MCG/0.5 IM SUSP
0.5000 mL | Freq: Once | INTRAMUSCULAR | Status: AC
  Administered 2017-04-20: 0.5 mL via INTRAMUSCULAR

## 2017-04-20 MED ORDER — TETANUS-DIPHTH-ACELL PERTUSSIS 5-2.5-18.5 LF-MCG/0.5 IM SUSP
INTRAMUSCULAR | Status: AC
  Filled 2017-04-20: qty 0.5

## 2017-04-20 MED ORDER — SILVER NITRATE-POT NITRATE 75-25 % EX MISC
CUTANEOUS | Status: AC
  Filled 2017-04-20: qty 1

## 2017-04-20 NOTE — ED Triage Notes (Signed)
Pt    Lost  Her  Balance    Academic librarian  A  lacertion  To  Her  r   Forearm   She  Is  On  Blood  Thinners  And  Has  A  History  Of  cva   In past       She  Is   Sitting  Upright   On the   Exam table  In no  Acute   /   Severe   Distress

## 2017-04-20 NOTE — Discharge Instructions (Signed)
Keep this clean and dry for the next 48-72 hours. The Steri-Strips should, often about 4 or 5 days. Use a nonstick dressing in between the Ace wrap and the Steri-Strips. Change this daily.

## 2017-04-20 NOTE — ED Provider Notes (Signed)
HPI  SUBJECTIVE:  Kaitlin [name] is a 71 y.o. female who presents with persistent bleeding from two skin tears that she sustained last night. Patient states that she lost her balance while getting into bed and hit her right arm on the railing of the bed. She denies chest pain, shortness of breath, palpitations, strokelike symptoms, syncope causing the fall. She did not hit her head, no headache, no nausea or vomiting. No arm deformity, numbness, tingling, grip weakness. They applied a towel and Neosporin without hemostasis. No aggravating factors. Tetanus 2012.  She has an extensive past medical history including atrial fibrillation, stroke, hypertension. She is on Plavix and Xarelto. No history of diabetes. PCP: Chilton Greathouse, MD   Past Medical History:  Diagnosis Date  . Abnormality of gait as late effect of stroke 02/23/2017  . Acute hypoxemic respiratory failure (HCC)   . Atrial fibrillation with RVR (HCC) 02/17/2017  . CAD S/P percutaneous coronary angioplasty 2001  . CVA (cerebral vascular accident) (HCC) 01/2017  . Dysphagia as late effect of stroke 02/23/2017  . HCVD (hypertensive cardiovascular disease) 01/2017   normal LVF with moderate LVH, grade 1 DD  . High cholesterol   . Hypokalemia   . Labile blood pressure   . Leukocytosis   . Multiple sclerosis (HCC)   . PAF (paroxysmal atrial fibrillation) (HCC) 01/2017  . Right hemiparesis [facility])     Past Surgical History:  Procedure Laterality Date  . CORONARY ANGIOPLASTY WITH STENT PLACEMENT  11/1999   RCA DES-[facility]  . IR ANGIO INTRA EXTRACRAN SEL COM CAROTID INNOMINATE UNI R MOD SED  02/15/2017  . IR ANGIO VERTEBRAL SEL SUBCLAVIAN INNOMINATE UNI R MOD SED  02/15/2017  . IR INTRAVSC STENT CERV CAROTID W/O EMB-PROT MOD SED INC ANGIO  02/15/2017  . IR PERCUTANEOUS ART THROMBECTOMY/INFUSION INTRACRANIAL INC DIAG ANGIO  02/15/2017  . NECK SURGERY    . RADIOLOGY WITH ANESTHESIA N/A 02/14/2017   Procedure: RADIOLOGY WITH ANESTHESIA;   Surgeon: Radiologist, Medication, MD;  Location: [facility] OR;  Service: Radiology;  Laterality: N/A;  . WRIST SURGERY      Family History  Problem Relation Age of Onset  . Heart disease Father   . Cancer Father   . Multiple sclerosis Sister   . Cancer Mother     Social History  Substance Use Topics  . Smoking status: Never Smoker  . Smokeless tobacco: Never Used  . Alcohol use 1.8 oz/week    3 Glasses of wine per week     Current Facility-Administered Medications:  .  silver nitrate applicators applicator 1 Stick, 1 Stick, Topical, Once, Domenick Gong, MD  Current Outpatient Prescriptions:  .  amiodarone (PACERONE) 200 MG tablet, Take 1 tablet (200 mg total) by mouth daily., Disp: 30 tablet, Rfl: 0 .  atorvastatin (LIPITOR) 40 MG tablet, Take 1 tablet (40 mg total) by mouth daily at 6 PM., Disp: 30 tablet, Rfl: 0 .  Calcium Citrate-Vitamin D (CALCIUM CITRATE + D PO), Take 1 tablet by mouth daily., Disp: , Rfl:  .  clopidogrel (PLAVIX) 75 MG tablet, Take 1 tablet (75 mg total) by mouth daily., Disp: 30 tablet, Rfl: 0 .  FLUoxetine (PROZAC) 10 MG capsule, Take 1 capsule (10 mg total) by mouth daily., Disp: 30 capsule, Rfl: 0 .  metoprolol tartrate (LOPRESSOR) 50 MG tablet, Take 1 tablet (50 mg total) by mouth 2 (two) times daily., Disp: 60 tablet, Rfl: 0 .  Multiple Vitamins-Minerals (PRESERVISION AREDS PO), Take 1 tablet by mouth 2 (two)  times daily., Disp: , Rfl:  .  pantoprazole (PROTONIX) 40 MG tablet, Take 1 tablet (40 mg total) by mouth daily., Disp: 30 tablet, Rfl: 0 .  Rivaroxaban (XARELTO) 15 MG TABS tablet, Take 1 tablet (15 mg total) by mouth daily with supper., Disp: 30 tablet, Rfl: 0 .  venlafaxine XR (EFFEXOR-XR) 75 MG 24 hr capsule, Take 1 capsule (75 mg total) by mouth daily., Disp: 30 capsule, Rfl: 0  Allergies  Allergen Reactions  . Eliquis [Apixaban] Rash  . Penicillins      ROS  As noted in HPI.   Physical Exam  BP 132/78 (BP Location: Left Arm)    Pulse 78   Temp 98.6 F (37 C) (Oral)   Resp 18   SpO2 98%   Constitutional: Well developed, well nourished, no acute distress Eyes:  EOMI, conjunctiva normal bilaterally HENT: Normocephalic, atraumatic,mucus membranes moist Respiratory: Normal inspiratory effort Cardiovascular: Normal rate GI: nondistended skin: No rash, skin intact Musculoskeletal: no deformities. 11 cm abrasion with 2 superficial skin avulsions that are oozing. See picture      Neurologic: Alert & oriented x 3, no focal neuro deficits Psychiatric: Speech and behavior appropriate   ED Course   Medications  silver nitrate applicators applicator 1 Stick (not administered)  Tdap (BOOSTRIX) injection 0.5 mL (0.5 mLs Intramuscular Given 04/20/17 1147)    No orders of the defined types were placed in this encounter.   No results found for this or any previous visit (from the past 24 hour(s)). No results found.  ED Clinical Impression  Skin avulsion   ED Assessment/Plan  Unable to find silver nitrate, so applied lidocaine 2% with epinephrine topically to help slow the bleeding. Then applied Steri-Strips the avulsions, then applied Surgicel, tefla dressing and an Ace wrap with hemostasis. Patient tolerated procedure well.   Updating tetanus.  Discussed , MDM, plan and followup with patient. Patient agrees with plan.   Meds ordered this encounter  Medications  . Tdap (BOOSTRIX) injection 0.5 mL  . silver nitrate applicators applicator 1 Stick    *This clinic note was created using Scientist, clinical (histocompatibility and immunogenetics). Therefore, there may be occasional mistakes despite careful proofreading.  ?   Domenick Gong, MD 04/20/17 1425

## 2017-04-21 ENCOUNTER — Other Ambulatory Visit (HOSPITAL_COMMUNITY): Payer: Self-pay | Admitting: Interventional Radiology

## 2017-04-21 DIAGNOSIS — I639 Cerebral infarction, unspecified: Secondary | ICD-10-CM

## 2017-04-25 ENCOUNTER — Encounter: Payer: Self-pay | Admitting: Neurology

## 2017-04-25 ENCOUNTER — Ambulatory Visit (INDEPENDENT_AMBULATORY_CARE_PROVIDER_SITE_OTHER): Payer: Medicare Other | Admitting: Neurology

## 2017-04-25 VITALS — BP 120/66 | HR 84 | Ht 62.0 in | Wt 121.0 lb

## 2017-04-25 DIAGNOSIS — I63412 Cerebral infarction due to embolism of left middle cerebral artery: Secondary | ICD-10-CM | POA: Diagnosis not present

## 2017-04-25 DIAGNOSIS — G35 Multiple sclerosis: Secondary | ICD-10-CM | POA: Diagnosis not present

## 2017-04-25 NOTE — Progress Notes (Signed)
[facility] [address] [place]. [place] [address] [phone]       OFFICE FOLLOW-UP NOTE  [name] Date of Birth:  06/28/1946 Medical Record Number:  [id number]   HPI: 39 year caucasian lady seen today for the first office follow-up visit following hospital admission for stroke in July 2018. She is accompanied by husband. History is up 10 from them as well as review of electronic medical records and have personally reviewed imaging films.Kaitlin Watkinsis an 71 y.o.femalewith a 20 year history of MS who presented as a Code Stroke for right sided weakness and garbled speech. Time of symptom onset was 11 AM while working in the yard, at which point she began to feel very hot and could not speak. She took a shower and symptoms seemed almost completely to resolve per husband. Subsequently, she had a nap and then at about 1700 she and her husband had a few glasses of wine, at which point he noticed that she was having some difficulty using her right side and it "was hard for her to get up", the latter symptom noted at about 1900. CT head showed focal hypodensity in left insula and left posterior putamen compatible with infarction with ASPECTS of8. Also noted was hyperdensity of thedistal left M1.CTA neck showed Occlusion of the left common and internal carotid artery from the arch to the distal cavernous segment.CTA head showed patent paraclinoid and terminal segments of left internal carotid artery and minimal flow in proximal left M1 likely retrograde via a tiny left posterior communicating artery. Occlusion of mid and distal left M1 with poor left MCA collateralization. Left A1 occlusion. Bilateral A2 are patent with the left likely perfused via the anterior communicating artery.  LSN:11 AM 02/14/17 tPA Given:No: Out of time window Of note, she has not had an MS exacerbation in several years and was recently taken off of Betaseron. Patient was admitted to the  intensive care unit where blood pressure was tightly controlled. She was extubated and did well. She was seen by physical speech and occupational therapy. She was started on initially aspirin and Brilinta by Dr. Corliss Skains for carotid stent but subsequently switched to Plavix and eliquis when anticoagulation was started 5 days after her stroke and it was felt to be safe. She developed mild rash on both cheeks which was felt to be related to eliquis hence it was discontinued and she was started on Xarelto instead .She had some mild hematuria but hematocrit remained stable. She had mild expressive language difficulties and mild residual right-sided hemiparesis at the time of discharge. She was transferred to inpatient rehabilitation. She did well in rehabilitation and was discharged home. She is planning to start outpatient physical and occupation therapy soon and has finished home therapies. She is unrelenting and Xarelto and tolerating them well without bleeding episodes but did have significant episode of bruising of her right forearm last week when she accidentally hit her forearm on the edge of the bed. She states her blood pressure is well controlled and today it is 120/66. She is tolerating Lipitor well without muscle aches and pains. She feels she is physically about 80% better though she still has some occasional word finding difficulties and cannot speak Fluently. A fine finger movements and dexterity is also diminished in the right hand. She has a long-standing history of multiple sclerosis and was followed by Dr. Vergia Alcon but has not been on Betaseron for several years. She is asking for referral to Dr. Epimenio Foot  to follow her for her multiple sclerosis.  ROS:   14 system review of systems is positive for  heat intolerance, snoring, acting out dreams, easy bruising, memory loss, speech difficulty, weakness, confusions, skin wounds and all other systems negative  PMH:  Past Medical History:    Diagnosis Date  . Abnormality of gait as late effect of stroke 02/23/2017  . Acute hypoxemic respiratory failure (HCC)   . Atrial fibrillation with RVR (HCC) 02/17/2017  . CAD S/P percutaneous coronary angioplasty 2001  . CVA (cerebral vascular accident) (HCC) 01/2017  . Dysphagia as late effect of stroke 02/23/2017  . HCVD (hypertensive cardiovascular disease) 01/2017   normal LVF with moderate LVH, grade 1 DD  . High cholesterol   . Hypokalemia   . Labile blood pressure   . Leukocytosis   . Multiple sclerosis (HCC)   . PAF (paroxysmal atrial fibrillation) (HCC) 01/2017  . Right hemiparesis [facility])     Social History:  Social History   Social History  . Marital status: Married    Spouse name: N/A  . Number of children: N/A  . Years of education: N/A   Occupational History  . Not on file.   Social History Main Topics  . Smoking status: Never Smoker  . Smokeless tobacco: Never Used  . Alcohol use 1.8 oz/week    3 Glasses of wine per week     Comment: wine  . Drug use: No  . Sexual activity: Not on file   Other Topics Concern  . Not on file   Social History Narrative  . No narrative on file    Medications:   Current Outpatient Prescriptions on File Prior to Visit  Medication Sig Dispense Refill  . amiodarone (PACERONE) 200 MG tablet Take 1 tablet (200 mg total) by mouth daily. 30 tablet 0  . atorvastatin (LIPITOR) 40 MG tablet Take 1 tablet (40 mg total) by mouth daily at 6 PM. 30 tablet 0  . Calcium Citrate-Vitamin D (CALCIUM CITRATE + D PO) Take 1 tablet by mouth daily.    . clopidogrel (PLAVIX) 75 MG tablet Take 1 tablet (75 mg total) by mouth daily. 30 tablet 0  . FLUoxetine (PROZAC) 10 MG capsule Take 1 capsule (10 mg total) by mouth daily. 30 capsule 0  . metoprolol tartrate (LOPRESSOR) 50 MG tablet Take 1 tablet (50 mg total) by mouth 2 (two) times daily. 60 tablet 0  . Multiple Vitamins-Minerals (PRESERVISION AREDS PO) Take 1 tablet by mouth 2 (two) times  daily.    . pantoprazole (PROTONIX) 40 MG tablet Take 1 tablet (40 mg total) by mouth daily. 30 tablet 0  . Rivaroxaban (XARELTO) 15 MG TABS tablet Take 1 tablet (15 mg total) by mouth daily with supper. 30 tablet 0  . venlafaxine XR (EFFEXOR-XR) 75 MG 24 hr capsule Take 1 capsule (75 mg total) by mouth daily. 30 capsule 0   No current facility-administered medications on file prior to visit.     Allergies:   Allergies  Allergen Reactions  . Eliquis [Apixaban] Rash  . Penicillins     Physical Exam General: Frail elderly Caucasian lady, seated, in no evident distress Head: head normocephalic and atraumatic.  Neck: supple with no carotid or supraclavicular bruits Cardiovascular: regular rate and rhythm, no murmurs Musculoskeletal: no deformity Skin:  no rash/petichiae Vascular:  Normal pulses all extremities Vitals:   04/25/17 0918  BP: 120/66  Pulse: 84   Neurologic Exam Mental Status: Awake and fully alert. Oriented to  place and time. Recent and remote memory intact. Attention span, concentration and fund of knowledge appropriate. Mood and affect appropriate. Speech mildly nonfluent with some word finding difficulties. Able to name, repeat and comprehend quite well. Cranial Nerves: Fundoscopic exam reveals sharp disc margins. Pupils equal, briskly reactive to light. Extraocular movements full without nystagmus. Visual fields full to confrontation. Hearing intact. Facial sensation intact. Face, tongue, palate moves normally and symmetrically.  Motor: Mild right hemiparesis 4/5 strength with weakness of right grip and intrinsic hand muscles. Orbits left over right upper extremity. Sensory.: intact to touch ,pinprick .position and vibratory sensation.  Coordination: Rapid alternating movements normal in all extremities. Finger-to-nose and heel-to-shin performed accurately bilaterally. Gait and Station: Arises from chair without difficulty. Stance is normal. Gait demonstrates normal  stride length and balance but uses walker and drags right leg slightly . Unable to heel, toe and tandem walk without difficulty.  Reflexes: 1+ and symmetric. Toes downgoing.   NIHSS  1 Modified Rankin  2   ASSESSMENT: 71 year old Caucasian lady with embolic left middle cerebral artery infarct in July 2018 secondary to new onset atrial fibrillation status post left ICA angioplasty stenting and left middle cerebral artery mechanical embolectomy with excellent clinical recovery. Remote history of possible multiple sclerosis which has been quite quiecent despite being off medication for more than a decade.    PLAN: I had a long d/w patient about her recent embolicstroke,atrial fibrillation risk for recurrent stroke/TIAs, personally independently reviewed imaging studies and stroke evaluation results and answered questions.Continue Xarelto (rivaroxaban) daily   for secondary stroke prevention and brillinta for carotid stent and maintain strict control of hypertension with blood pressure goal below 130/90, diabetes with hemoglobin A1c goal below 6.5% and lipids with LDL cholesterol goal below 70 mg/dL. I also advised the patient to eat a healthy diet with plenty of whole grains, cereals, fruits and vegetables, exercise regularly and maintain ideal body weight. I have counseled the patient to use her beta blocker and all times and to avoid falls and injuries to minimize bruising and bleeding and she is high risk being on Xarelto and Brilinta. Continue outpatient physical and occupational therapy. The patient and husband have requested a referral to Dr. Epimenio Foot for her multiple sclerosis which appears quite benign and quiecent for more than a decade even without medications Followup in the future with my nurse practitioner in 6 months or call earlier if necessary Greater than 50% of time during this 35 minute visit was spent on counseling,explanation of diagnosis, planning of further management, discussion with  patient and family and coordination of care Delia Heady, MD  [facility] [address] [place], [place] [address]  Phone [phone] Fax [phone] Note: This document was prepared with digital dictation and possible smart phrase technology. Any transcriptional errors that result from this process are unintentional

## 2017-04-25 NOTE — Patient Instructions (Addendum)
I had a long d/w patient about her recent embolicstroke,atrial fibrillation risk for recurrent stroke/TIAs, personally independently reviewed imaging studies and stroke evaluation results and answered questions.Continue Xarelto (rivaroxaban) daily   for secondary stroke prevention and brillinta for carotid stent and maintain strict control of hypertension with blood pressure goal below 130/90, diabetes with hemoglobin A1c goal below 6.5% and lipids with LDL cholesterol goal below 70 mg/dL. I also advised the patient to eat a healthy diet with plenty of whole grains, cereals, fruits and vegetables, exercise regularly and maintain ideal body weight. I have counseled the patient to use her beta blocker and all times and to avoid falls and injuries to minimize bruising and bleeding and she is high risk being on Xarelto and Brilinta. Continue outpatient physical and occupational therapy. The patient and husband have requested a referral to Dr. Epimenio Foot for her multiple sclerosis which appears quite benign and quiecent for more than a decade even without medications Followup in the future with my nurse practitioner in 6 months or call earlier if necessary  Stroke Prevention Some medical conditions and behaviors are associated with an increased chance of having a stroke. You may prevent a stroke by making healthy choices and managing medical conditions. How can I reduce my risk of having a stroke?  Stay physically active. Get at least 30 minutes of activity on most or all days.  Do not smoke. It may also be helpful to avoid exposure to secondhand smoke.  Limit alcohol use. Moderate alcohol use is considered to be: ? No more than 2 drinks per day for men. ? No more than 1 drink per day for nonpregnant women.  Eat healthy foods. This involves: ? Eating 5 or more servings of fruits and vegetables a day. ? Making dietary changes that address high blood pressure (hypertension), high cholesterol, diabetes, or  obesity.  Manage your cholesterol levels. ? Making food choices that are high in fiber and low in saturated fat, trans fat, and cholesterol may control cholesterol levels. ? Take any prescribed medicines to control cholesterol as directed by your health care provider.  Manage your diabetes. ? Controlling your carbohydrate and sugar intake is recommended to manage diabetes. ? Take any prescribed medicines to control diabetes as directed by your health care provider.  Control your hypertension. ? Making food choices that are low in salt (sodium), saturated fat, trans fat, and cholesterol is recommended to manage hypertension. ? Ask your health care provider if you need treatment to lower your blood pressure. Take any prescribed medicines to control hypertension as directed by your health care provider. ? If you are 62-57 years of age, have your blood pressure checked every 3-5 years. If you are 67 years of age or older, have your blood pressure checked every year.  Maintain a healthy weight. ? Reducing calorie intake and making food choices that are low in sodium, saturated fat, trans fat, and cholesterol are recommended to manage weight.  Stop drug abuse.  Avoid taking birth control pills. ? Talk to your health care provider about the risks of taking birth control pills if you are over 42 years old, smoke, get migraines, or have ever had a blood clot.  Get evaluated for sleep disorders (sleep apnea). ? Talk to your health care provider about getting a sleep evaluation if you snore a lot or have excessive sleepiness.  Take medicines only as directed by your health care provider. ? For some people, aspirin or blood thinners (anticoagulants) are helpful in  reducing the risk of forming abnormal blood clots that can lead to stroke. If you have the irregular heart rhythm of atrial fibrillation, you should be on a blood thinner unless there is a good reason you cannot take them. ? Understand all  your medicine instructions.  Make sure that other conditions (such as anemia or atherosclerosis) are addressed. Get help right away if:  You have sudden weakness or numbness of the face, arm, or leg, especially on one side of the body.  Your face or eyelid droops to one side.  You have sudden confusion.  You have trouble speaking (aphasia) or understanding.  You have sudden trouble seeing in one or both eyes.  You have sudden trouble walking.  You have dizziness.  You have a loss of balance or coordination.  You have a sudden, severe headache with no known cause.  You have new chest pain or an irregular heartbeat. Any of these symptoms may represent a serious problem that is an emergency. Do not wait to see if the symptoms will go away. Get medical help at once. Call your local emergency services (911 in [place] Do not drive yourself to the hospital. This information is not intended to replace advice given to you by your health care provider. Make sure you discuss any questions you have with your health care provider. Document Released: 08/26/2004 Document Revised: 12/25/2015 Document Reviewed: 01/19/2013 Elsevier Interactive Patient Education  2017 [employer].

## 2017-04-28 ENCOUNTER — Encounter: Payer: Self-pay | Admitting: Physical Medicine & Rehabilitation

## 2017-04-28 ENCOUNTER — Encounter: Payer: Medicare Other | Attending: Physical Medicine & Rehabilitation

## 2017-04-28 ENCOUNTER — Ambulatory Visit (HOSPITAL_BASED_OUTPATIENT_CLINIC_OR_DEPARTMENT_OTHER): Payer: Medicare Other | Admitting: Physical Medicine & Rehabilitation

## 2017-04-28 ENCOUNTER — Ambulatory Visit: Payer: Self-pay | Admitting: Physical Medicine & Rehabilitation

## 2017-04-28 VITALS — BP 162/74 | HR 57 | Resp 14

## 2017-04-28 DIAGNOSIS — I63512 Cerebral infarction due to unspecified occlusion or stenosis of left middle cerebral artery: Secondary | ICD-10-CM | POA: Diagnosis not present

## 2017-04-28 DIAGNOSIS — I69398 Other sequelae of cerebral infarction: Secondary | ICD-10-CM | POA: Diagnosis not present

## 2017-04-28 DIAGNOSIS — I69319 Unspecified symptoms and signs involving cognitive functions following cerebral infarction: Secondary | ICD-10-CM

## 2017-04-28 DIAGNOSIS — I69322 Dysarthria following cerebral infarction: Secondary | ICD-10-CM | POA: Diagnosis present

## 2017-04-28 DIAGNOSIS — I69351 Hemiplegia and hemiparesis following cerebral infarction affecting right dominant side: Secondary | ICD-10-CM

## 2017-04-28 DIAGNOSIS — R269 Unspecified abnormalities of gait and mobility: Secondary | ICD-10-CM | POA: Diagnosis not present

## 2017-04-28 NOTE — Progress Notes (Signed)
Subjective:    Patient ID: Kaitlin [name], female    DOB: [birth year], 71 y.o.   MRN: [id number]  HPI Needs help with buttoning but can don shirt Forgets walker and ambulates too quickly [place] and had arm laceration last week. We'll be starting outpatient therapy next week Has seen vascular neurology, will transfer her MS neurology to [facility]   Pain Inventory Average Pain 0 Pain Right Now 0 My pain is no pain  In the last 24 hours, has pain interfered with the following? General activity 0 Relation with others 0 Enjoyment of life 0 What TIME of day is your pain at its worst? no pain Sleep (in general) Good  Pain is worse with: no pain Pain improves with: no pain Relief from Meds: no pain  Mobility walk with assistance use a walker how many minutes can you walk? 5 ability to climb steps?  yes do you drive?  no use a wheelchair needs help with transfers transfers alone  Function retired I need assistance with the following:  dressing, bathing, toileting, meal prep, household duties and shopping  Neuro/Psych weakness trouble walking confusion  Prior Studies Any changes since last visit?  no  Physicians involved in your care Any changes since last visit?  no   Family History  Problem Relation Age of Onset  . Heart disease Father   . Cancer Father   . Multiple sclerosis Sister   . Cancer Mother    Social History   Social History  . Marital status: Married    Spouse name: N/A  . Number of children: N/A  . Years of education: N/A   Social History Main Topics  . Smoking status: Never Smoker  . Smokeless tobacco: Never Used  . Alcohol use 1.8 oz/week    3 Glasses of wine per week     Comment: wine  . Drug use: No  . Sexual activity: Not Asked   Other Topics Concern  . None   Social History Narrative  . None   Past Surgical History:  Procedure Laterality Date  . CORONARY ANGIOPLASTY WITH STENT PLACEMENT  11/1999   [facility]  . IR  ANGIO INTRA EXTRACRAN SEL COM CAROTID INNOMINATE UNI R MOD SED  02/15/2017  . IR ANGIO VERTEBRAL SEL SUBCLAVIAN INNOMINATE UNI R MOD SED  02/15/2017  . IR INTRAVSC STENT CERV CAROTID W/O EMB-PROT MOD SED INC ANGIO  02/15/2017  . IR PERCUTANEOUS ART THROMBECTOMY/INFUSION INTRACRANIAL INC DIAG ANGIO  02/15/2017  . NECK SURGERY    . RADIOLOGY WITH ANESTHESIA N/A 02/14/2017   Procedure: RADIOLOGY WITH ANESTHESIA;  Surgeon: Radiologist, Medication, MD;  Location: [facility] OR;  Service: Radiology;  Laterality: N/A;  . WRIST SURGERY     Past Medical History:  Diagnosis Date  . Abnormality of gait as late effect of stroke 02/23/2017  . Acute hypoxemic respiratory failure (HCC)   . Atrial fibrillation with RVR (HCC) 02/17/2017  . CAD S/P percutaneous coronary angioplasty 2001  . CVA (cerebral vascular accident) (HCC) 01/2017  . Dysphagia as late effect of stroke 02/23/2017  . HCVD (hypertensive cardiovascular disease) 01/2017   normal LVF with moderate LVH, grade 1 DD  . High cholesterol   . Hypokalemia   . Labile blood pressure   . Leukocytosis   . Multiple sclerosis (HCC)   . PAF (paroxysmal atrial fibrillation) (HCC) 01/2017  . Right hemiparesis (HCC)    BP (!) 162/74 (BP Location: Left Arm, Patient Position: Sitting, Cuff Size: Normal)   Pulse Marland Kitchen)  57   Resp 14   SpO2 94%   Opioid Risk Score:   Fall Risk Score:  `1  Depression screen PHQ 2/9  Depression screen PHQ 2/9 03/31/2017  Decreased Interest 0  Down, Depressed, Hopeless 0  PHQ - 2 Score 0  Altered sleeping 0  Tired, decreased energy 0  Change in appetite 0  Feeling bad or failure about yourself  0  Trouble concentrating 0  Moving slowly or fidgety/restless 0  Suicidal thoughts 0  PHQ-9 Score 0  Difficult doing work/chores Not difficult at all    Review of Systems  HENT: Negative.   Respiratory: Positive for shortness of breath. Negative for apnea.   Cardiovascular: Negative.   Endocrine: Negative.   Genitourinary: Negative.    Musculoskeletal: Positive for gait problem.  Skin: Negative.   Allergic/Immunologic: Negative.   Neurological: Positive for weakness.  Hematological: Bruises/bleeds easily.  Psychiatric/Behavioral: Positive for confusion.       Objective:   Physical Exam  Constitutional: She is oriented to person, place, and time. She appears well-developed and well-nourished.  HENT:  Head: Normocephalic and atraumatic.  Eyes: Pupils are equal, round, and reactive to light. Conjunctivae and EOM are normal.  Neck: Normal range of motion.  Neurological: She is alert and oriented to person, place, and time. No sensory deficit. Coordination and gait abnormal.  Motor strength is 4/5 in the right deltoid, biceps, triceps, grip, hip flexor, knee extensor, ankle dorsiflexor. Left side is 5/5 in the deltoid, bicep, tricep, grip, hip flexor, knee extensor, ankle dorsiflexor. Ambulatory the rolling walker. No evidence to drag or knee instability. She is able to follow simple three-step commands. She is oriented to person, [place], not city, month, day but not year Patient can name simple objects. She is able to repeat  Skin: Skin is warm and dry.  Nursing note and vitals reviewed.  positive dysdiadochokinesis with rapid alternating supination, pronation of the right upper extremity        Assessment & Plan:  1. Left MCA distribution infarct superimposed on small vessel disease as well as history of multiple sclerosis. She still has a residual right hemiparesis, but this is relatively mild. She has decreased fine motor deficits. Because of her cognition, she does not have good safety awareness. She is a high fall risk and she is on anticoagulants, making her at risk for bleeding.  Recommend outpatient PT, OT and speech therapy  Follow up with neurology , both for her stroke as well as for her multiple sclerosis  Physical medicine and rehabilitation follow-up in one month  Over half of the 25 min visit  was spent counseling and coordinating care. We discussed her fall risk, advised putting signs up around the home, call don't fall

## 2017-04-28 NOTE — Patient Instructions (Signed)
Please place signs in the bathroom, bedroom and other areas. CALL DON'T FALL

## 2017-05-04 ENCOUNTER — Ambulatory Visit: Payer: Medicare Other | Attending: Physical Medicine & Rehabilitation

## 2017-05-04 DIAGNOSIS — I69353 Hemiplegia and hemiparesis following cerebral infarction affecting right non-dominant side: Secondary | ICD-10-CM | POA: Insufficient documentation

## 2017-05-04 DIAGNOSIS — I69318 Other symptoms and signs involving cognitive functions following cerebral infarction: Secondary | ICD-10-CM | POA: Diagnosis present

## 2017-05-04 DIAGNOSIS — R4701 Aphasia: Secondary | ICD-10-CM | POA: Diagnosis present

## 2017-05-04 DIAGNOSIS — R278 Other lack of coordination: Secondary | ICD-10-CM | POA: Insufficient documentation

## 2017-05-04 DIAGNOSIS — M6281 Muscle weakness (generalized): Secondary | ICD-10-CM | POA: Diagnosis present

## 2017-05-04 DIAGNOSIS — R2689 Other abnormalities of gait and mobility: Secondary | ICD-10-CM | POA: Insufficient documentation

## 2017-05-04 DIAGNOSIS — R471 Dysarthria and anarthria: Secondary | ICD-10-CM | POA: Diagnosis present

## 2017-05-04 DIAGNOSIS — R482 Apraxia: Secondary | ICD-10-CM | POA: Diagnosis present

## 2017-05-04 DIAGNOSIS — R2681 Unsteadiness on feet: Secondary | ICD-10-CM | POA: Diagnosis present

## 2017-05-04 DIAGNOSIS — R41841 Cognitive communication deficit: Secondary | ICD-10-CM | POA: Diagnosis present

## 2017-05-04 NOTE — Therapy (Signed)
[facility] [address] [place], [place], [address] Phone: [phone]   Fax:  [phone]  Speech Language Pathology Evaluation  Patient Details  Name: Kaitlin [name] MRN: [id number] Date of Birth: 1945-08-27 Referring Provider: Claudette Laws MD  Encounter Date: 05/04/2017      End of Session - 05/04/17 1722    Visit Number 1   Number of Visits 17   Date for SLP Re-Evaluation 07/11/17   SLP Start Time 1148   SLP Stop Time  1230   SLP Time Calculation (min) 42 min   Activity Tolerance Patient tolerated treatment well      Past Medical History:  Diagnosis Date  . Abnormality of gait as late effect of stroke 02/23/2017  . Acute hypoxemic respiratory failure (HCC)   . Atrial fibrillation with RVR (HCC) 02/17/2017  . CAD S/P percutaneous coronary angioplasty 2001  . CVA (cerebral vascular accident) (HCC) 01/2017  . Dysphagia as late effect of stroke 02/23/2017  . HCVD (hypertensive cardiovascular disease) 01/2017   normal LVF with moderate LVH, grade 1 DD  . High cholesterol   . Hypokalemia   . Labile blood pressure   . Leukocytosis   . Multiple sclerosis (HCC)   . PAF (paroxysmal atrial fibrillation) (HCC) 01/2017  . Right hemiparesis [facility])     Past Surgical History:  Procedure Laterality Date  . CORONARY ANGIOPLASTY WITH STENT PLACEMENT  11/1999   RCA DES-[facility]  . IR ANGIO INTRA EXTRACRAN SEL COM CAROTID INNOMINATE UNI R MOD SED  02/15/2017  . IR ANGIO VERTEBRAL SEL SUBCLAVIAN INNOMINATE UNI R MOD SED  02/15/2017  . IR INTRAVSC STENT CERV CAROTID W/O EMB-PROT MOD SED INC ANGIO  02/15/2017  . IR PERCUTANEOUS ART THROMBECTOMY/INFUSION INTRACRANIAL INC DIAG ANGIO  02/15/2017  . NECK SURGERY    . RADIOLOGY WITH ANESTHESIA N/A 02/14/2017   Procedure: RADIOLOGY WITH ANESTHESIA;  Surgeon: Radiologist, Medication, MD;  Location: [facility] OR;  Service: Radiology;  Laterality: N/A;  . WRIST SURGERY      There were no  vitals filed for this visit.      Subjective Assessment - 05/04/17 1154    Subjective "Not very much at all." (pt, re: what SLPs on rehab did with pt - husband states pt has short term memory deficits)   Patient is accompained by: Family member  husband   Currently in Pain? No/denies            SLP Evaluation [facility] - 05/04/17 1154      SLP Visit Information   SLP Received On 05/04/17   Referring Provider Claudette Laws MD   Onset Date 02-14-17   Medical Diagnosis CVA     General Information   HPI Pt 71 y.o. female with hx CAD s/p PTCA, MS with gait disorder/memory loss, lt cca and ICA CVA 02-14-17 - then lt putamen, caudate, and corona radiata. Extensive white matter disease was also ID'd likely due to MS, and including chronic microvascular ischemia.      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   Overall Cognitive Status History of cognitive impairments - at baseline   Area of Impairment Memory     Auditory Comprehension   Overall Auditory Comprehension Impaired   Commands Within Functional Limits  for simple two step,&  mult-unit(touch 3 fingers to rt knee")   Conversation Simple   Other Conversation Comments Pt incorrectly answering simple conversational questions   EffectiveTechniques Repetition;Stressing words     Verbal Expression   Overall Verbal Expression Impaired   Initiation Impaired   Automatic Speech Month of year;Day of week;Social Response   Naming Impairment   Responsive 76-100% accurate   Confrontation --  8/15 words without cues; phonemic cues helpful 6/7   Divergent 50-74% accurate  100% simple categories, 70% mod complex (car brands)   Verbal Errors Semantic paraphasias;Aware of errors;Not aware of errors;Inconsistent   Effective Techniques Phonemic cues   Other Verbal  Expression Comments Hesitating speech with reading: "prison"/prism, "sorizon"/horizon. interjecting "and" throughtout, rarely. Responsive naming WNL,  Pt rarely using compensations. SLP feels like pt would have more neologism if she allowed herself to say more during evaluation.     Motor Speech   Overall Motor Speech Impaired   Respiration --   Level of Impairment Phrase   Phonation Low vocal intensity  decr confidence re: word choice may affect/complicate   Articulation Within functional limitis   Motor Planning Witnin functional limits                         SLP Education - 05/04/17 1237    Education provided Yes   Education Details aphasia tips, results of eval   Person(s) Educated Patient;Spouse   Methods Explanation   Comprehension Verbalized understanding;Need further instruction          SLP Short Term Goals - 05/04/17 1729      SLP SHORT TERM GOAL #1   Title pt will generate 12 items in a simple category with compensations allowed   Time 4   Period Weeks   Status New     SLP SHORT TERM GOAL #2   Title pt will provide sentence level responses using language compensations 90% success   Time 4   Period Weeks   Status New     SLP SHORT TERM GOAL #3   Title pt will demo understanding of 5 minutes mod complex conversation, by providing appropriate responses in conversation 95% over three sessions   Time 4   Period Weeks   Status New     SLP SHORT TERM GOAL #4   Title pt will demo appropriate breath support 95% for improvement in voice volume at word level   Time 4   Period Weeks   Status New          SLP Long Term Goals - 05/04/17 1741      SLP LONG TERM GOAL #1   Title pt will demo appropriate breath support for adequate speech volume in 10 minutes simple conversation    Time 8   Period Weeks   Status New     SLP LONG TERM GOAL #2   Title pt will demo understanding of 10 minutes mod-complex conversation with request for repeats PRN    Time 8   Period Weeks   Status New     SLP LONG TERM GOAL #3   Title Pt will complete mod complex/complex naming tasks with occasional min A   Time 8   Period Weeks   Status New     SLP LONG TERM GOAL #4   Title pt will produce 10 minutes simple-mod complex conversation using compensations for anomia over three sessions   Time 8   Period Weeks   Status New  Plan - May 12, 2017 1723    Clinical Impression Statement Pt presents today with mod expressive aphasia, and some receptive aphasia c/b SLP needing to repeat question/comment after pt responded incorrectly in conversation, rarely.  Pt is not using anomia compensations in conversation, is just asking husband for assistance (to which he complied, during eval). Auditory comprehensoin preserved for following multiple unit commands (e.g., put your palm to your nose then touch your knee with three fingers). Pt with frequent hesitation in reading task, with semantic and literal paraphasias, occasionally. Dysarthria observed mostly due to weakened voice/breath support, complicated by pt unsure of correct word to verbalize. Pt also with premorbid memory deficits but husband states these are exacerbated since CVA, and compensations will be addressed if not in occupational therapy. SLP strongly suggested memory notebook/binder. Pt will receive skilled ST to address aphasia, dysarthria (volume), and possibly memory should OT not address.    Speech Therapy Frequency 2x / week   Duration --  8 weeks   Treatment/Interventions Language facilitation;Internal/external aids;Compensatory techniques;SLP instruction and feedback;Multimodal communcation approach;Cognitive reorganization;Functional tasks;Cueing hierarchy;Compensatory strategies;Patient/family education   Potential to Achieve Goals Fair   Potential Considerations Co-morbidities;Severity of impairments;Ability to learn/carryover information;Previous level of function   Consulted and  Agree with Plan of Care Patient;Family member/caregiver   Family Member Consulted husband      Patient will benefit from skilled therapeutic intervention in order to improve the following deficits and impairments:   Aphasia  Dysarthria and anarthria  Cognitive communication deficit      G-Codes - May 12, 2017 1744    Functional Assessment Tool Used noms, clinical judgment   Functional Limitations Spoken language comprehension   Spoken Language Comprehension Current Status [phone]) At least 20 percent but less than 40 percent impaired, limited or restricted   Spoken Language Comprehension Goal Status (G9160) At least 1 percent but less than 20 percent impaired, limited or restricted      Problem List Patient Active Problem List   Diagnosis Date Noted  . CAD (coronary artery disease) 04/12/2017  . Hypertension 04/11/2017  . Right hemiparesis (HCC)   . Aspiration pneumonia of right lung (HCC)   . Leukocytosis   . Hypokalemia   . Labile blood pressure   . Abnormality of gait as late effect of stroke 02/23/2017  . Dysarthria as late effect of stroke 02/23/2017  . Dysphagia as late effect of stroke 02/23/2017  . Acute ischemic left middle cerebral artery (MCA) stroke (HCC) 02/22/2017  . Paroxysmal A-fib (HCC) 02/17/2017  . Atrial fibrillation with RVR (HCC) 02/17/2017  . HLD (hyperlipidemia) 02/17/2017  . Acute hypoxemic respiratory failure (HCC)   . Stroke (cerebrum) (HCC) - Acute MCA infarct due to left CCA/ICA and MCA occlusion, s/p mechanical thrombectomy and left ICA stenting in the setting of newly diagnosed afib 02/14/2017    [facility] ,MS, CCC-SLP  05/12/2017, 5:45 PM  [facility] [address] [place], [place], [address] Phone: [phone]   Fax:  [phone]  Name: Kaitlin [name] MRN: [id number] Date of Birth: 08-08-1945

## 2017-05-04 NOTE — Patient Instructions (Signed)
Tips for Talking with People who have Aphasia  . Say one thing at a time . Don't  rush - slow down, be patient . Talk face to face . Reduce background noise . Relax - be natural . Use pen and paper . Write down key words . Draw diagrams or pictures . Don't pretend you understand . Ask what helps . Recap - check you both understand . Be a partner, not a therapist   Aphasia does not affect intelligence, only language. The person with aphasia can still: make decisions, have opinions, and socialize.   Describing words  What group does it belong to?  What do I use it for?  Where can I find it?  What does it LOOK like?  What other words go with it?  What is the 1st sound of the word?    Many Ways to Communicate  Describe it Write it Draw it Gesture it Use related words  There's an App for that: Family Feud, Heads up, Stop-fun categories, What if, Conversation TherAPPy  Provided by: Laura L. ST, [phone]   

## 2017-05-05 ENCOUNTER — Encounter: Payer: Self-pay | Admitting: Physical Therapy

## 2017-05-05 ENCOUNTER — Ambulatory Visit: Payer: Medicare Other | Admitting: Occupational Therapy

## 2017-05-05 ENCOUNTER — Ambulatory Visit: Payer: Medicare Other | Admitting: Physical Therapy

## 2017-05-05 DIAGNOSIS — R278 Other lack of coordination: Secondary | ICD-10-CM

## 2017-05-05 DIAGNOSIS — M6281 Muscle weakness (generalized): Secondary | ICD-10-CM

## 2017-05-05 DIAGNOSIS — R4701 Aphasia: Secondary | ICD-10-CM | POA: Diagnosis not present

## 2017-05-05 DIAGNOSIS — R482 Apraxia: Secondary | ICD-10-CM

## 2017-05-05 DIAGNOSIS — R2689 Other abnormalities of gait and mobility: Secondary | ICD-10-CM

## 2017-05-05 DIAGNOSIS — R2681 Unsteadiness on feet: Secondary | ICD-10-CM

## 2017-05-05 DIAGNOSIS — I69318 Other symptoms and signs involving cognitive functions following cerebral infarction: Secondary | ICD-10-CM

## 2017-05-05 DIAGNOSIS — I69353 Hemiplegia and hemiparesis following cerebral infarction affecting right non-dominant side: Secondary | ICD-10-CM

## 2017-05-05 NOTE — Therapy (Signed)
[facility] [address] [place], [place], [address] Phone: [phone]   Fax:  [phone]  Occupational Therapy Evaluation  Patient Details  Name: Kaitlin [name] MRN: [id number] Date of Birth: 10/07/1945 Referring Provider: Dr. Wynn Banker  Encounter Date: 05/05/2017      OT End of Session - 05/05/17 1623    Visit Number 1   Number of Visits 17   Date for OT Re-Evaluation 07/04/17   Authorization Type [employer] MCR - G code needed   Authorization - Visit Number 1   Authorization - Number of Visits 10   OT Start Time 1400   OT Stop Time 1450   OT Time Calculation (min) 50 min   Activity Tolerance Patient tolerated treatment well      Past Medical History:  Diagnosis Date  . Abnormality of gait as late effect of stroke 02/23/2017  . Acute hypoxemic respiratory failure (HCC)   . Atrial fibrillation with RVR (HCC) 02/17/2017  . CAD S/P percutaneous coronary angioplasty 2001  . CVA (cerebral vascular accident) (HCC) 01/2017  . Dysphagia as late effect of stroke 02/23/2017  . HCVD (hypertensive cardiovascular disease) 01/2017   normal LVF with moderate LVH, grade 1 DD  . High cholesterol   . Hypokalemia   . Labile blood pressure   . Leukocytosis   . Multiple sclerosis (HCC)   . PAF (paroxysmal atrial fibrillation) (HCC) 01/2017  . Right hemiparesis [facility])     Past Surgical History:  Procedure Laterality Date  . CORONARY ANGIOPLASTY WITH STENT PLACEMENT  11/1999   [facility]CA DES[facility]  . IR ANGIO INTRA EXTRACRAN SEL COM CAROTID INNOMINATE UNI R MOD SED  02/15/2017  . IR ANGIO VERTEBRAL SEL SUBCLAVIAN INNOMINATE UNI R MOD SED  02/15/2017  . IR INTRAVSC STENT CERV CAROTID W/O EMB-PROT MOD SED INC ANGIO  02/15/2017  . IR PERCUTANEOUS ART THROMBECTOMY/INFUSION INTRACRANIAL INC DIAG ANGIO  02/15/2017  . NECK SURGERY    . RADIOLOGY WITH ANESTHESIA N/A 02/14/2017   Procedure: RADIOLOGY WITH ANESTHESIA;  Surgeon: Radiologist,  Medication, MD;  Location: [facility] OR;  Service: Radiology;  Laterality: N/A;  . WRIST SURGERY      There were no vitals filed for this visit.      Subjective Assessment - 05/05/17 1407    Patient is accompained by: Family member  Husband   Pertinent History CVA 02/14/17 PMH: MS for approx 20 years (but no exacerbation for 10 years), A-fib, CAD w/ stent in 2000, white matter dz   Patient Stated Goals to increase balance   Currently in Pain? No/denies           [facility] OT Assessment - 05/05/17 1414      Assessment   Diagnosis CVA    Referring Provider Dr. Wynn Banker   Onset Date 02/14/17   Assessment MS for approx. 20 years   Prior Therapy CIR 02/22/17 - 03/19/17, HH therapies     Precautions   Precautions Fall     Balance Screen   Has the patient fallen in the past 6 months Yes   How many times? 3   Has the patient had a decrease in activity level because of a fear of falling?  No  but spouse is   Is the patient reluctant to leave their home because of a fear of falling?  No     Home  Environment   Educational psychologist;Door  with high threshhold   Additional Comments DME: w/c, walker, rollator, shower chair  Lives With Spouse     Prior Function   Level of Independence Independent with basic ADLs  and some IADLS   Vocation Retired  [employer] (indoors, outdoors), hike     ADL   Eating/Feeding Independent   Grooming Modified independent  Assist with styling hair, flossing   Upper Body Bathing Supervision/safety   Lower Body Bathing Supervision/safety   Upper Body Dressing Minimal assistance  assist with hooking bra, and cues for sequencing shirt   Lower Body Dressing Minimal assistance  cueing for sequencing, dependent for tying shoes   Toilet Transfer Modified independent   Toileting - Clothing Manipulation Modified independent   Toileting -  Hygiene Modified Independent   Tub/Shower Transfer Supervision/safety  CGA to get out of  shower   ADL comments Pt at fall risk      IADL   Shopping Needs to be accompanied on any shopping trip;Assistance for transportation   Light Housekeeping --  dependent except for folding laundry, sorting silverware   Meal Prep Needs to have meals prepared and served  husband did majority of cooking prior to CVA   Community Mobility Relies on family or friends for transportation  Pt drove prior to CVA   Medication Management Is not capable of dispensing or managing own medication  Husband helps her put into medical box   Financial Management Dependent  husband has always done     Mobility   Mobility Status Needs assist   Mobility Status Comments uses rollator in community, regular walker in house, but not always uses properly d/t cognitive deficits     Written Expression   Dominant Hand Left   Handwriting 100% legible  noted expressive aphasia with random sentence     Vision - History   Baseline Vision Wears glasses all the time   Visual History --  cataract surgery bilaterally   Additional Comments early stages of macular degeneration.      Vision Assessment   Comment Pt reports visual changes initially but reports resolved. Pt unable to state/verbalize initial deficits     Cognition   Overall Cognitive Status Impaired/Different from baseline  Pt had some memory deficits from MS, but worse since CVA     Observation/Other Assessments   Observations Pt with aphasia - will further assess cognition as able. See speech eval for details. Pt also appears apraxic RUE, Rt inattention and learned non use     Sensation   Additional Comments appears intact for light touch, but difficult to determine if able to localize stimulus d/t aphasia     Coordination   9 Hole Peg Test Right;Left   Right 9 Hole Peg Test 2 min. 6 sec.   Left 9 Hole Peg Test 27.03 sec     Perception   Perception Impaired   Inattention/Neglect Does not attend to right side of body     Praxis   Praxis  Impaired   Praxis Impairment Details Motor planning     Edema   Edema very mild Lt hand     ROM / Strength   AROM / PROM / Strength AROM     AROM   Overall AROM Comments LUE AROM WNL's. RUE: Sh flex approx 90-95*, ER/IR 75%, elbow WFL's, supination 75%, wrist ext 75%, gross finger flex/ext intact     Hand Function   Right Hand Grip (lbs) 22   Left Hand Grip (lbs) 47  OT Education - 05/05/17 1622    Education provided Yes   Education Details OT results/POC   Person(s) Educated Patient;Spouse   Methods Explanation   Comprehension Verbalized understanding          OT Short Term Goals - 05/05/17 1630      OT SHORT TERM GOAL #1   Title Pt/family independent with coordination and putty HEP    Time 4   Period Weeks   Status New   Target Date 06/04/17     OT SHORT TERM GOAL #2   Title Pt/family independent with HEP for RUE shoulder ROM   Time 4   Period Weeks   Status New     OT SHORT TERM GOAL #3   Title Pt/family to verbalize understanding with safety with shower transfers and bathing with DME/AE prn   Time 4   Period Weeks   Status New     OT SHORT TERM GOAL #4   Title Pt/family to verbalize understanding with memory strategies for sequencing with donning shirt/pants, sequencing and safety with use of walker/rollator, and for medication management   Time 4   Period Weeks   Status New     OT SHORT TERM GOAL #5   Title Pt to improve grip strength by 10 lbs Rt hand    Baseline 22 lbs   Time 4   Period Weeks   Status New     Additional Short Term Goals   Additional Short Term Goals Yes     OT SHORT TERM GOAL #6   Title Pt to improve coordination Rt hand as evidenced by performing 9 hole peg test to 100 sec. or less   Baseline 2 min. 6 sec   Time 4   Period Weeks   Status New           OT Long Term Goals - 05/05/17 1635      OT LONG TERM GOAL #1   Title Pt to retrieve light weight object from high shelf RUE  requiring 120 degrees shoulder flexion w/o drops 5/5 trials   Time 8   Period Weeks   Status New     OT LONG TERM GOAL #2   Title Pt to perform simple snack prep and light IADLS (washing dishes, laundry tasks) demo safety with walker w/o LOB   Time 8   Period Weeks   Status New     OT LONG TERM GOAL #3   Title Pt to implement memory strategies into ADLS including sequencing of donning clothes, walker negotiation, and medication management with supervision and no more than min cues   Time 8   Period Weeks   Status New     OT LONG TERM GOAL #4   Title Pt to improve coordination Rt hand as evidenced by performing 9 hole peg test in 80 sec. or less   Baseline 2 min. 6 sec   Time 8   Period Weeks   Status New     OT LONG TERM GOAL #5   Title Pt to tie shoes and style hair mod I level with A/E prn   Time 8   Period Weeks   Status New               Plan - 05/05/17 1624    Clinical Impression Statement Pt is a 71 y.o. female who presents to outpatient rehab s/p Lt CVA 02/14/17 with Rt non dominant side hemiparesis, decr. coordination, strength, and cognitive deficits. Pt  also has MS which affected memory prior to CVA, but pt's husband reports worse since CVA.    Occupational Profile and client history currently impacting functional performance PMH: MS, A-fib, CAD, white matter disease. Pt also with aphasia from stroke which impacts pt's ability to communicate effectively and comprehend instructions. Pt also apraxic from stroke   Occupational performance deficits (Please refer to evaluation for details): ADL's;IADL's;Leisure;Social Participation;Education   Rehab Potential Fair   Current Impairments/barriers affecting progress: severity of deficits including aphasia, cognition   OT Frequency 2x / week   OT Duration 8 weeks  plus eval   OT Treatment/Interventions Self-care/ADL training;DME and/or AE instruction;Patient/family education;Therapeutic exercises;Therapeutic  activities;Functional Mobility Training;Passive range of motion;Cognitive remediation/compensation;Visual/perceptual remediation/compensation;Manual Therapy;Moist Heat   Plan coordination and putty HEP, discuss further safety with bathing/shower transfers   Clinical Decision Making Several treatment options, min-mod task modification necessary   Consulted and Agree with Plan of Care Patient;Family member/caregiver   Family Member Consulted husband      Patient will benefit from skilled therapeutic intervention in order to improve the following deficits and impairments:  Decreased coordination, Decreased range of motion, Impaired flexibility, Impaired sensation, Decreased safety awareness, Decreased knowledge of precautions, Impaired tone, Impaired UE functional use, Decreased knowledge of use of DME, Decreased mobility, Decreased balance, Decreased cognition, Decreased strength, Impaired perceived functional ability, Impaired vision/preception  Visit Diagnosis: Hemiplegia and hemiparesis following cerebral infarction affecting right non-dominant side (HCC) - Plan: Ot plan of care cert/re-cert  Other lack of coordination - Plan: Ot plan of care cert/re-cert  Apraxia - Plan: Ot plan of care cert/re-cert  Muscle weakness (generalized) - Plan: Ot plan of care cert/re-cert  Other symptoms and signs involving cognitive functions following cerebral infarction - Plan: Ot plan of care cert/re-cert      G-Codes - 2017-05-09 1639    Functional Assessment Tool Used (Outpatient only) RUE function based on 9 hole peg test, grip strength, inattention to Rt side   Functional Limitation Carrying, moving and handling objects   Carrying, Moving and Handling Objects Current Status (Z6109) At least 60 percent but less than 80 percent impaired, limited or restricted   Carrying, Moving and Handling Objects Goal Status (U0454) At least 20 percent but less than 40 percent impaired, limited or restricted       Problem List Patient Active Problem List   Diagnosis Date Noted  . CAD (coronary artery disease) 04/12/2017  . Hypertension 04/11/2017  . Right hemiparesis (HCC)   . Aspiration pneumonia of right lung (HCC)   . Leukocytosis   . Hypokalemia   . Labile blood pressure   . Abnormality of gait as late effect of stroke 02/23/2017  . Dysarthria as late effect of stroke 02/23/2017  . Dysphagia as late effect of stroke 02/23/2017  . Acute ischemic left middle cerebral artery (MCA) stroke (HCC) 02/22/2017  . Paroxysmal A-fib (HCC) 02/17/2017  . Atrial fibrillation with RVR (HCC) 02/17/2017  . HLD (hyperlipidemia) 02/17/2017  . Acute hypoxemic respiratory failure (HCC)   . Stroke (cerebrum) (HCC) - Acute MCA infarct due to left CCA/ICA and MCA occlusion, s/p mechanical thrombectomy and left ICA stenting in the setting of newly diagnosed afib 02/14/2017    Kelli Churn, OTR/L May 09, 2017, 4:43 PM  [facility] [address] [place], [place], [address] Phone: [phone]   Fax:  [phone]  Name: Rocio Roam MRN: [id number] Date of Birth: 06-Aug-1945

## 2017-05-06 DIAGNOSIS — R4701 Aphasia: Secondary | ICD-10-CM | POA: Diagnosis not present

## 2017-05-06 NOTE — Therapy (Signed)
[facility] [address] [place], [place], [address] Phone: [phone]   Fax:  [phone]  Physical Therapy Evaluation  Patient Details  Name: Brynnlie Unterreiner MRN: [id number] Date of Birth: 1946-03-11 Referring Provider: Claudette Laws MD  Encounter Date: 05/05/2017      PT End of Session - 05/06/17 1356    Visit Number 1   Number of Visits 17   Date for PT Re-Evaluation 07/04/17   Authorization Type UHC MCR   Authorization Time Period 05/05/17  to  07/04/17   PT Start Time 1312   PT Stop Time 1402   PT Time Calculation (min) 50 min   Equipment Utilized During Treatment Gait belt   Activity Tolerance Patient tolerated treatment well   Behavior During Therapy [facility] for tasks assessed/performed      Past Medical History:  Diagnosis Date  . Abnormality of gait as late effect of stroke 02/23/2017  . Acute hypoxemic respiratory failure (HCC)   . Atrial fibrillation with RVR (HCC) 02/17/2017  . CAD S/P percutaneous coronary angioplasty 2001  . CVA (cerebral vascular accident) (HCC) 01/2017  . Dysphagia as late effect of stroke 02/23/2017  . HCVD (hypertensive cardiovascular disease) 01/2017   normal LVF with moderate LVH, grade 1 DD  . High cholesterol   . Hypokalemia   . Labile blood pressure   . Leukocytosis   . Multiple sclerosis (HCC)   . PAF (paroxysmal atrial fibrillation) (HCC) 01/2017  . Right hemiparesis [facility])     Past Surgical History:  Procedure Laterality Date  . CORONARY ANGIOPLASTY WITH STENT PLACEMENT  11/1999   RCA DES-[facility]  . IR ANGIO INTRA EXTRACRAN SEL COM CAROTID INNOMINATE UNI R MOD SED  02/15/2017  . IR ANGIO VERTEBRAL SEL SUBCLAVIAN INNOMINATE UNI R MOD SED  02/15/2017  . IR INTRAVSC STENT CERV CAROTID W/O EMB-PROT MOD SED INC ANGIO  02/15/2017  . IR PERCUTANEOUS ART THROMBECTOMY/INFUSION INTRACRANIAL INC DIAG ANGIO  02/15/2017  . NECK SURGERY    . RADIOLOGY WITH ANESTHESIA N/A 02/14/2017    Procedure: RADIOLOGY WITH ANESTHESIA;  Surgeon: Radiologist, Medication, MD;  Location: [facility] OR;  Service: Radiology;  Laterality: N/A;  . WRIST SURGERY      There were no vitals filed for this visit.       Subjective Assessment - 05/05/17 1317    Subjective "Standing up straight; getting my self-control" (balance). Husband states she walks too fast and doesn't always take her walker with her. She has tried to carry potted plants inside home. "I think she forgets she can't do everything she is used to doing."   Patient is accompained by: Family member  [name]   Patient Stated Goals "Standing up straight; getting my self-control" (balance)   Currently in Pain? No/denies            [facility] PT Assessment - 05/06/17 0001      Assessment   Medical Diagnosis Lt CVA; gait disorder   Referring Provider Claudette Laws MD   Onset Date/Surgical Date 02/14/17   Hand Dominance Left   Prior Therapy CIR, HHPT      Precautions   Precautions Fall     Restrictions   Weight Bearing Restrictions No     Balance Screen   Has the patient fallen in the past 6 months Yes   How many times? 3  home-going too fast up the inside step; missed EOB; shower   Has the patient had a decrease in activity level because of  a fear of falling?  No   Is the patient reluctant to leave their home because of a fear of falling?  No     Home Environment   Living Environment Private residence   Living Arrangements Spouse/significant other   Available Help at Discharge Family;Available 24 hours/day   Type of Home House   Home Access Stairs to enter   Entrance Stairs-Number of Steps 1  front; 5 steps to garage Lt rail   Entrance Stairs-Rails None   Home Layout Two level   Alternate Level Stairs-Number of Steps 1  1 up to bedroom level; 15 steps to upstairs (recumbent bike)   Alternate Level Stairs-Rails None   Home Equipment Walker - 2 wheels;Walker - 4 wheels;Shower seat;Wheelchair - manual;Bedside commode    Additional Comments Rt AFO (HHPT discharged it per husband)     Prior Function   Level of Independence Independent   Vocation Retired  Oceanographer (indoors, outdoors), Pharmacist, hospital   Overall Cognitive Status History of cognitive impairments - at baseline   Area of Impairment Memory   Memory Decreased short-term memory   Memory Comments prior to CVA due to MS? mostly related to directions to familiar places     Observation/Other Assessments   Observations enters with rollator with husband   Focus on Therapeutic Outcomes (FOTO)  FS 46 (risk adjusted 49)   Lower Extremity Functional Scale  35.8     Sensation   Light Touch Impaired by gross assessment  RLE a little numb per pt   Proprioception Appears Intact  intact at ankle     Coordination   Gross Motor Movements are Fluid and Coordinated Yes     Posture/Postural Control   Posture/Postural Control No significant limitations     Tone   Assessment Location Right Lower Extremity     ROM / Strength   AROM / PROM / Strength AROM;Strength     AROM   Overall AROM  Within functional limits for tasks performed  bil LEs     Strength   Overall Strength Deficits   Overall Strength Comments LLE: WNL except Knee extension 4+/5, flexion 4/5;  RLE- WNL except knee extension 4/5, flexion 3+/5     Transfers   Transfers Sit to Stand;Stand to Sit   Sit to Stand 4: Min guard;4: Min assist;With upper extremity assist   Sit to Stand Details (indicate cue type and reason) vc and occasional assist to properly position rollator and to lock/unlock brakes (espencially on her Rt)    Stand to Sit 4: Min guard;4: Min assist   Stand to Sit Details vc and occasional assist to properly position rollator and to lock/unlock brakes (espencially on her Rt)    Comments tends to "park" rollator off to the side and then take final steps, pivot to sit down     Ambulation/Gait   Ambulation/Gait Yes   Ambulation/Gait Assistance 4: Min  guard   Ambulation Distance (Feet) 60 Feet  40, 40   Assistive device Rollator   Gait Pattern Step-through pattern;Decreased step length - left;Decreased stance time - right;Right foot flat;Trunk flexed;Poor foot clearance - right   Ambulation Surface Level   Gait velocity 32.8 ft/14.59 sec=2.25 ft/sec  2.79 ft/sec norm for age     Balance   Balance comment stands with feet together and EC x 30 sec with minguard; tandem stance Lt foot leads=2 sec, Rt foot leads=14 sec     Standardized Balance Assessment  Standardized Balance Assessment Timed Up and Go Test     Timed Up and Go Test   Normal TUG (seconds) 18.25  with rollator; no device 17.81 sec   TUG Comments with rollator, the brakes were not locked during either transfer (as pt does currently); when locked prior to stand, she could not unlock them herself     RLE Tone   RLE Tone Within Functional Limits            Objective measurements completed on examination: See above findings.                  PT Education - 05/06/17 1355    Education provided Yes   Education Details results of PT eval and PT POC; safe use of rollator with transfers and gait   Person(s) Educated Patient;Spouse   Methods Explanation   Comprehension Verbalized understanding          PT Short Term Goals - 05/06/17 1417      PT SHORT TERM GOAL #1   Title Patient (with supervision to min-guard assistance of her spouse) will perform basic HEP for balance and strengthening. (Target STGs 06/04/17)   Time 4   Period Weeks   Status New   Target Date 06/04/17     PT SHORT TERM GOAL #2   Title Patient will complete FGA with STG and LTG set as appropriate.   Time 1   Period Weeks   Status New   Target Date 05/13/17     PT SHORT TERM GOAL #3   Title Patient will improve TUG with or without a device to <15 seconds to demonstrate a lesser fall risk.    Baseline rollator 18.25; no device 17.81 sec   Time 4   Period Weeks   Status  New   Target Date 06/04/17     PT SHORT TERM GOAL #4   Title Patient will improve her gait velocity to 2.62 ft/sec to indicate lesser fall risk with community ambulation.    Baseline 2.25 ft/sec   Time 4   Period Weeks   Status New   Target Date 06/04/17           PT Long Term Goals - 05/06/17 1421      PT LONG TERM GOAL #1   Title Patient and/or husband will be able to verbalize signs and symptoms of CVA and appropriate action to take. (Target LTGs: 07/04/17)   Time 8   Period Weeks   Status New   Target Date 07/04/17     PT LONG TERM GOAL #2   Title Patient (with supervision of husband) will perform updated HEP   Time 8   Period Weeks   Status New   Target Date 07/04/17     PT LONG TERM GOAL #3   Title Patient will ambulate >=2.79 ft/sec (age based norm) with LRAD or no device on level surface.   Time 8   Period Weeks   Status New   Target Date 07/04/17     PT LONG TERM GOAL #4   Title Patient will ambulate >= 1000 ft on outdoor surfaces (level and unlevel, grass, gravel, mulch) with LRAD and supervision.    Time 8   Period Weeks   Status New   Target Date 07/04/17     PT LONG TERM GOAL #5   Title Patient will perform TUG <13.5 seconds to demonstrate lesser fall risk.   Time 8   Period Weeks  Status New                Plan - 05/20/17 1358    Clinical Impression Statement Patient presents for OPPT evaluation s/p Lt putamen, caudate, corona radiata CVA 02/14/17 with husband present. Patient with language deficits and husband provides some information for her. Patient presents with >3 co-morbidities and/or personal factors that are relevant to her PT POC.    History and Personal Factors relevant to plan of care: PMH- MS with gait disorder/memory loss, atrial fibrillation, CAD s/p PTCA, HTN, aphasia ;  Personal factors-behavior (impulsive) with safety concerns   Clinical Presentation Evolving   Clinical Presentation due to: < 3 months since acute CVA;  decreased safety awareness/awareness of deficits   Clinical Decision Making Moderate   Rehab Potential Good   Clinical Impairments Affecting Rehab Potential impulsivity and decr awareness of deficits   PT Frequency 2x / week   PT Duration 8 weeks   PT Treatment/Interventions ADLs/Self Care Home Management;DME Instruction;Gait training;Stair training;Functional mobility training;Orthotic Fit/Training;Patient/family education;Cognitive remediation;Neuromuscular re-education;Balance training;Therapeutic exercise;Therapeutic activities   PT Next Visit Plan do FGA (?with or without rollator) and set STG or LTG; initiate HEP for balance and strengthening; provide fall prevention information   Consulted and Agree with Plan of Care Patient;Family member/caregiver   Family Member Consulted spouse, [name]      Patient will benefit from skilled therapeutic intervention in order to improve the following deficits and impairments:  Abnormal gait, Decreased balance, Decreased cognition, Decreased knowledge of use of DME, Decreased mobility, Decreased safety awareness, Decreased strength, Difficulty walking, Impaired perceived functional ability, Impaired sensation, Impaired UE functional use  Visit Diagnosis: Muscle weakness (generalized) - Plan: PT plan of care cert/re-cert  Hemiplegia and hemiparesis following cerebral infarction affecting right non-dominant side (HCC) - Plan: PT plan of care cert/re-cert  Other abnormalities of gait and mobility - Plan: PT plan of care cert/re-cert  Unsteadiness on feet - Plan: PT plan of care cert/re-cert      G-Codes - May 20, 2017 1429    Functional Assessment Tool Used (Outpatient Only) TUG 18.25 seconds with rollator; 17.81 sec no device; gait velocity 2.25 ft/sec   Functional Limitation Mobility: Walking and moving around   Mobility: Walking and Moving Around Current Status [phone]) At least 20 percent but less than 40 percent impaired, limited or restricted    Mobility: Walking and Moving Around Goal Status [phone]) At least 1 percent but less than 20 percent impaired, limited or restricted       Problem List Patient Active Problem List   Diagnosis Date Noted  . CAD (coronary artery disease) 04/12/2017  . Hypertension 04/11/2017  . Right hemiparesis (HCC)   . Aspiration pneumonia of right lung (HCC)   . Leukocytosis   . Hypokalemia   . Labile blood pressure   . Abnormality of gait as late effect of stroke 02/23/2017  . Dysarthria as late effect of stroke 02/23/2017  . Dysphagia as late effect of stroke 02/23/2017  . Acute ischemic left middle cerebral artery (MCA) stroke (HCC) 02/22/2017  . Paroxysmal A-fib (HCC) 02/17/2017  . Atrial fibrillation with RVR (HCC) 02/17/2017  . HLD (hyperlipidemia) 02/17/2017  . Acute hypoxemic respiratory failure (HCC)   . Stroke (cerebrum) (HCC) - Acute MCA infarct due to left CCA/ICA and MCA occlusion, s/p mechanical thrombectomy and left ICA stenting in the setting of newly diagnosed afib 02/14/2017    Zena Amos, PT May 20, 2017, 2:36 PM  [facility] [address]  [address] [place], [place], [address] Phone: [phone]   Fax:  [phone]  Name: Dannie Woolen MRN: [id number] Date of Birth: 1946-02-09

## 2017-05-10 ENCOUNTER — Ambulatory Visit (HOSPITAL_COMMUNITY)
Admission: RE | Admit: 2017-05-10 | Discharge: 2017-05-10 | Disposition: A | Discharge status: Home / Self Care | Payer: Medicare Other | Source: Ambulatory Visit | Attending: Interventional Radiology | Admitting: Interventional Radiology

## 2017-05-10 DIAGNOSIS — I639 Cerebral infarction, unspecified: Secondary | ICD-10-CM

## 2017-05-10 HISTORY — PX: IR RADIOLOGIST EVAL & MGMT: IMG5224

## 2017-05-11 ENCOUNTER — Ambulatory Visit: Payer: Medicare Other | Admitting: Physical Therapy

## 2017-05-11 ENCOUNTER — Ambulatory Visit: Payer: Medicare Other | Admitting: Occupational Therapy

## 2017-05-11 ENCOUNTER — Encounter (HOSPITAL_COMMUNITY): Payer: Self-pay | Admitting: Interventional Radiology

## 2017-05-11 DIAGNOSIS — R2689 Other abnormalities of gait and mobility: Secondary | ICD-10-CM

## 2017-05-11 DIAGNOSIS — R2681 Unsteadiness on feet: Secondary | ICD-10-CM

## 2017-05-11 DIAGNOSIS — M6281 Muscle weakness (generalized): Secondary | ICD-10-CM

## 2017-05-11 DIAGNOSIS — I69353 Hemiplegia and hemiparesis following cerebral infarction affecting right non-dominant side: Secondary | ICD-10-CM

## 2017-05-11 DIAGNOSIS — R278 Other lack of coordination: Secondary | ICD-10-CM

## 2017-05-11 DIAGNOSIS — R482 Apraxia: Secondary | ICD-10-CM

## 2017-05-11 DIAGNOSIS — R4701 Aphasia: Secondary | ICD-10-CM | POA: Diagnosis not present

## 2017-05-11 DIAGNOSIS — I69318 Other symptoms and signs involving cognitive functions following cerebral infarction: Secondary | ICD-10-CM

## 2017-05-11 NOTE — Therapy (Signed)
[facility] [address] [place], [place], [address] Phone: [phone]   Fax:  [phone]  Occupational Therapy Treatment  Patient Details  Name: Kaitlin [name] MRN: [id number] Date of Birth: 28-Mar-1946 Referring Provider: Dr. Wynn Banker  Encounter Date: 05/11/2017      OT End of Session - 05/11/17 1513    Visit Number 2   Number of Visits 17   Date for OT Re-Evaluation 07/04/17   Authorization Type UHC MCR - G code needed   Authorization - Visit Number 2   Authorization - Number of Visits 10   OT Start Time 0845   OT Stop Time 0930   OT Time Calculation (min) 45 min   Activity Tolerance Patient tolerated treatment well      Past Medical History:  Diagnosis Date  . Abnormality of gait as late effect of stroke 02/23/2017  . Acute hypoxemic respiratory failure (HCC)   . Atrial fibrillation with RVR (HCC) 02/17/2017  . CAD S/P percutaneous coronary angioplasty 2001  . CVA (cerebral vascular accident) (HCC) 01/2017  . Dysphagia as late effect of stroke 02/23/2017  . HCVD (hypertensive cardiovascular disease) 01/2017   normal LVF with moderate LVH, grade 1 DD  . High cholesterol   . Hypokalemia   . Labile blood pressure   . Leukocytosis   . Multiple sclerosis (HCC)   . PAF (paroxysmal atrial fibrillation) (HCC) 01/2017  . Right hemiparesis [facility])     Past Surgical History:  Procedure Laterality Date  . CORONARY ANGIOPLASTY WITH STENT PLACEMENT  11/1999   [facility] DES[facility]  . IR ANGIO INTRA EXTRACRAN SEL COM CAROTID INNOMINATE UNI R MOD SED  02/15/2017  . IR ANGIO VERTEBRAL SEL SUBCLAVIAN INNOMINATE UNI R MOD SED  02/15/2017  . IR INTRAVSC STENT CERV CAROTID W/O EMB-PROT MOD SED INC ANGIO  02/15/2017  . IR PERCUTANEOUS ART THROMBECTOMY/INFUSION INTRACRANIAL INC DIAG ANGIO  02/15/2017  . IR RADIOLOGIST EVAL & MGMT  05/10/2017  . NECK SURGERY    . RADIOLOGY WITH ANESTHESIA N/A 02/14/2017   Procedure: RADIOLOGY WITH  ANESTHESIA;  Surgeon: Radiologist, Medication, MD;  Location: [facility] OR;  Service: Radiology;  Laterality: N/A;  . WRIST SURGERY      There were no vitals filed for this visit.      Subjective Assessment - 05/11/17 0854    Subjective  Husband reports this is the first time she has gotten choked up since when the stroke first happened   Patient is accompained by: Family member  husband   Pertinent History CVA 02/14/17 PMH: MS for approx 20 years (but no exacerbation for 10 years), A-fib, CAD w/ stent in 2000, white matter dz   Patient Stated Goals to increase balance   Currently in Pain? No/denies                      OT Treatments/Exercises (OP) - 05/11/17 0001      ADLs   Functional Mobility Discussed safety re: using walker correctly and making sure Rt hand is on walker before walking. Also discussed proper way to transfer to chair as pt is impulsive     Exercises   Exercises Hand     Hand Exercises   Other Hand Exercises Pt issued putty HEP - See pt instructions for details. Pt already has red putty at home (given by [facility])      Fine Motor Coordination   Other Fine Motor Exercises Pt issued modified coordination HEP with some  modified constraint to LUE (while seated only) to increase attention and use of RUE - see pt instructions for details. Husband present for all education. Pt also with apraxia RUE                OT Education - 05/11/17 0920    Education provided Yes   Education Details coordination and putty HEP, safety with walker and transfers   Person(s) Educated Patient   Methods Explanation;Demonstration;Handout   Comprehension Verbalized understanding;Returned demonstration          OT Short Term Goals - 05/11/17 1514      OT SHORT TERM GOAL #1   Title Pt/family independent with coordination and putty HEP    Time 4   Period Weeks   Status On-going     OT SHORT TERM GOAL #2   Title Pt/family independent with HEP for RUE shoulder ROM    Time 4   Period Weeks   Status New     OT SHORT TERM GOAL #3   Title Pt/family to verbalize understanding with safety with shower transfers and bathing with DME/AE prn   Time 4   Period Weeks   Status On-going     OT SHORT TERM GOAL #4   Title Pt/family to verbalize understanding with memory strategies for sequencing with donning shirt/pants, sequencing and safety with use of walker/rollator, and for medication management   Time 4   Period Weeks   Status New     OT SHORT TERM GOAL #5   Title Pt to improve grip strength by 10 lbs Rt hand    Baseline 22 lbs   Time 4   Period Weeks   Status On-going     OT SHORT TERM GOAL #6   Title Pt to improve coordination Rt hand as evidenced by performing 9 hole peg test to 100 sec. or less   Baseline 2 min. 6 sec   Time 4   Period Weeks   Status On-going           OT Long Term Goals - 05/05/17 1635      OT LONG TERM GOAL #1   Title Pt to retrieve light weight object from high shelf RUE requiring 120 degrees shoulder flexion w/o drops 5/5 trials   Time 8   Period Weeks   Status New     OT LONG TERM GOAL #2   Title Pt to perform simple snack prep and light IADLS (washing dishes, laundry tasks) demo safety with walker w/o LOB   Time 8   Period Weeks   Status New     OT LONG TERM GOAL #3   Title Pt to implement memory strategies into ADLS including sequencing of donning clothes, walker negotiation, and medication management with supervision and no more than min cues   Time 8   Period Weeks   Status New     OT LONG TERM GOAL #4   Title Pt to improve coordination Rt hand as evidenced by performing 9 hole peg test in 80 sec. or less   Baseline 2 min. 6 sec   Time 8   Period Weeks   Status New     OT LONG TERM GOAL #5   Title Pt to tie shoes and style hair mod I level with A/E prn   Time 8   Period Weeks   Status New               Plan - 05/11/17 1514  Clinical Impression Statement Pt demo impulsivity,  decreased attention and neglect to Rt side, apraxia, and deficits in cognition which will impede safety with ADLS.    Rehab Potential Fair   Current Impairments/barriers affecting progress: severity of deficits including aphasia, cognition   OT Frequency 2x / week   OT Duration 8 weeks   OT Treatment/Interventions Self-care/ADL training;DME and/or AE instruction;Patient/family education;Therapeutic exercises;Therapeutic activities;Functional Mobility Training;Passive range of motion;Cognitive remediation/compensation;Visual/perceptual remediation/compensation;Manual Therapy;Moist Heat   Plan RUE HEP, further discuss safety with bathing/shower transfers (pt's husband to bring in pictures next session as he forgot today)    Consulted and Agree with Plan of Care Patient;Family member/caregiver   Family Member Consulted husband      Patient will benefit from skilled therapeutic intervention in order to improve the following deficits and impairments:  Decreased coordination, Decreased range of motion, Impaired flexibility, Impaired sensation, Decreased safety awareness, Decreased knowledge of precautions, Impaired tone, Impaired UE functional use, Decreased knowledge of use of DME, Decreased mobility, Decreased balance, Decreased cognition, Decreased strength, Impaired perceived functional ability, Impaired vision/preception  Visit Diagnosis: Hemiplegia and hemiparesis following cerebral infarction affecting right non-dominant side (HCC)  Other lack of coordination  Apraxia  Other symptoms and signs involving cognitive functions following cerebral infarction  Muscle weakness (generalized)    Problem List Patient Active Problem List   Diagnosis Date Noted  . CAD (coronary artery disease) 04/12/2017  . Hypertension 04/11/2017  . Right hemiparesis (HCC)   . Aspiration pneumonia of right lung (HCC)   . Leukocytosis   . Hypokalemia   . Labile blood pressure   . Abnormality of gait as late  effect of stroke 02/23/2017  . Dysarthria as late effect of stroke 02/23/2017  . Dysphagia as late effect of stroke 02/23/2017  . Acute ischemic left middle cerebral artery (MCA) stroke (HCC) 02/22/2017  . Paroxysmal A-fib (HCC) 02/17/2017  . Atrial fibrillation with RVR (HCC) 02/17/2017  . HLD (hyperlipidemia) 02/17/2017  . Acute hypoxemic respiratory failure (HCC)   . Stroke (cerebrum) (HCC) - Acute MCA infarct due to left CCA/ICA and MCA occlusion, s/p mechanical thrombectomy and left ICA stenting in the setting of newly diagnosed afib 02/14/2017    Kelli Churn, OTR/L 05/11/2017, 3:16 PM  [facility] [address] [place], [place], [address] Phone: [phone]   Fax:  [phone]  Name: Aleicia Kenagy MRN: [id number] Date of Birth: [birth year]

## 2017-05-11 NOTE — Patient Instructions (Addendum)
    Coordination Activities  Perform the following activities for 20 minutes 2 times per day with right hand(s). Keep Lt hand in pocket or put oven mitt on Lt hand, but ONLY perform seated DO NOT USE RT HAND FOR ANYTHING HOT, SHARP, HEAVY, OR BREAKABLE   Toss ball in air and catch with the same hand. (May need to practice with Lt hand before switching to Rt had)   Flip cards 1 at a time as fast as you can.  Deal cards with your thumb (Hold deck in hand and push card off top with thumb).  Pick up checkers, cotton balls, buttons, marbles, dried beans/pasta, etc. of different sizes and place in container.  Pick up pennies and place in container or coin bank.  Pick up nickles and stack (3 stacks of 5).  Twirl high lighter between first 3 fingers.  Use Rt hand to eat finger foods (fruit, cold veggies, crackers, granola bar, sandwich, etc)   Use Rt hand to help bathe and dress seated  1. Grip Strengthening (Resistive Putty)   Squeeze putty using thumb and all fingers. Repeat _15_ times. Do __2__ sessions per day.   2. Roll putty into tube on table and pinch between index, long fingers and thumb x 10 reps. 2 sessions per day

## 2017-05-11 NOTE — Patient Instructions (Addendum)
Functional Quadriceps: Sit to Stand    Sit on edge of chair, feet flat on floor and arms across chest. Stand upright, extending knees fully, then slowly sit back down. Repeat 10 times per set. Do 1 set per session. Do 1-2 sessions per day.  http://orth.exer.us/735   Copyright  VHI. All rights reserved.   Holding onto a sturdy surface for balance: kitchen counter top or table, locked rollator:   Toe / Heel Raise (Standing)    Standing with support, raise heels up and hold this position for 5 seconds, lower slowly. Then rock back on heels and raise toes, hold for 5 seconds, then lower down slowly. Repeat _10_ times. 1-2 times a day.  Copyright  VHI. All rights reserved.    Single Leg - Eyes Open    Holding support, lift right leg while maintaining balance over other leg. Progress to removing hands from support surface for longer periods of time. Hold_10_ seconds. Repeat _3_ times each leg per session. Do _1-2_ sessions per day.  Copyright  VHI. All rights reserved.   Perform these in a corner with chair in front of you for safety:  Feet Together (Compliant Surface) Varied Arm Positions - Eyes Closed    Stand on compliant surface: pillow/s with feet together and arms as needed for balance. Close eyes and visualize upright position. Hold_20_ seconds. Repeat _3_ times per session. Do _1-2_ sessions per day.  Copyright  VHI. All rights reserved.   Feet Together (Compliant Surface) Head Motion - Eyes Closed    Stand on compliant surface: _pillow/s_ with feet together. Close eyes and move head slowly: 1. Up and down x 10 reps 2. Left and right x 10 reps Do _1-2_ sessions per day.  Copyright  VHI. All rights reserved.

## 2017-05-12 ENCOUNTER — Encounter: Payer: Self-pay | Admitting: Neurology

## 2017-05-12 ENCOUNTER — Encounter: Payer: Self-pay | Admitting: Occupational Therapy

## 2017-05-12 ENCOUNTER — Ambulatory Visit: Payer: Medicare Other | Admitting: Speech Pathology

## 2017-05-12 ENCOUNTER — Ambulatory Visit: Payer: Medicare Other | Admitting: Occupational Therapy

## 2017-05-12 ENCOUNTER — Ambulatory Visit (INDEPENDENT_AMBULATORY_CARE_PROVIDER_SITE_OTHER): Payer: Medicare Other | Admitting: Neurology

## 2017-05-12 VITALS — BP 140/59 | HR 60 | Resp 16 | Ht 62.0 in | Wt 121.5 lb

## 2017-05-12 DIAGNOSIS — I69398 Other sequelae of cerebral infarction: Secondary | ICD-10-CM | POA: Diagnosis not present

## 2017-05-12 DIAGNOSIS — R41841 Cognitive communication deficit: Secondary | ICD-10-CM

## 2017-05-12 DIAGNOSIS — R4701 Aphasia: Secondary | ICD-10-CM | POA: Diagnosis not present

## 2017-05-12 DIAGNOSIS — R278 Other lack of coordination: Secondary | ICD-10-CM

## 2017-05-12 DIAGNOSIS — R482 Apraxia: Secondary | ICD-10-CM

## 2017-05-12 DIAGNOSIS — R2681 Unsteadiness on feet: Secondary | ICD-10-CM

## 2017-05-12 DIAGNOSIS — R471 Dysarthria and anarthria: Secondary | ICD-10-CM

## 2017-05-12 DIAGNOSIS — R269 Unspecified abnormalities of gait and mobility: Secondary | ICD-10-CM

## 2017-05-12 DIAGNOSIS — G8191 Hemiplegia, unspecified affecting right dominant side: Secondary | ICD-10-CM | POA: Diagnosis not present

## 2017-05-12 DIAGNOSIS — G35 Multiple sclerosis: Secondary | ICD-10-CM

## 2017-05-12 DIAGNOSIS — I6300 Cerebral infarction due to thrombosis of unspecified precerebral artery: Secondary | ICD-10-CM | POA: Diagnosis not present

## 2017-05-12 DIAGNOSIS — I69318 Other symptoms and signs involving cognitive functions following cerebral infarction: Secondary | ICD-10-CM

## 2017-05-12 DIAGNOSIS — I69353 Hemiplegia and hemiparesis following cerebral infarction affecting right non-dominant side: Secondary | ICD-10-CM

## 2017-05-12 DIAGNOSIS — R2689 Other abnormalities of gait and mobility: Secondary | ICD-10-CM

## 2017-05-12 NOTE — Therapy (Signed)
[facility] [address] [place], [place], [address] Phone: [phone]   Fax:  [phone]  Physical Therapy Treatment  Patient Details  Name: Shawanda Sievert MRN: [id number] Date of Birth: 1946-01-21 Referring Provider: Claudette Laws MD  Encounter Date: 05/11/2017      PT End of Session - 05/11/17 1454    Visit Number 2   Number of Visits 17   Date for PT Re-Evaluation 07/04/17   Authorization Type UHC MCR   Authorization Time Period 05/05/17  to  07/04/17   PT Start Time 1447   PT Stop Time 1530   PT Time Calculation (min) 43 min   Equipment Utilized During Treatment Gait belt   Activity Tolerance Patient tolerated treatment well   Behavior During Therapy [facility] for tasks assessed/performed      Past Medical History:  Diagnosis Date  . Abnormality of gait as late effect of stroke 02/23/2017  . Acute hypoxemic respiratory failure (HCC)   . Atrial fibrillation with RVR (HCC) 02/17/2017  . CAD S/P percutaneous coronary angioplasty 2001  . CVA (cerebral vascular accident) (HCC) 01/2017  . Dysphagia as late effect of stroke 02/23/2017  . HCVD (hypertensive cardiovascular disease) 01/2017   normal LVF with moderate LVH, grade 1 DD  . High cholesterol   . Hypokalemia   . Labile blood pressure   . Leukocytosis   . Multiple sclerosis (HCC)   . PAF (paroxysmal atrial fibrillation) (HCC) 01/2017  . Right hemiparesis [facility])     Past Surgical History:  Procedure Laterality Date  . CORONARY ANGIOPLASTY WITH STENT PLACEMENT  11/1999   RCA DES-[facility]  . IR ANGIO INTRA EXTRACRAN SEL COM CAROTID INNOMINATE UNI R MOD SED  02/15/2017  . IR ANGIO VERTEBRAL SEL SUBCLAVIAN INNOMINATE UNI R MOD SED  02/15/2017  . IR INTRAVSC STENT CERV CAROTID W/O EMB-PROT MOD SED INC ANGIO  02/15/2017  . IR PERCUTANEOUS ART THROMBECTOMY/INFUSION INTRACRANIAL INC DIAG ANGIO  02/15/2017  . IR RADIOLOGIST EVAL & MGMT  05/10/2017  . NECK SURGERY      . RADIOLOGY WITH ANESTHESIA N/A 02/14/2017   Procedure: RADIOLOGY WITH ANESTHESIA;  Surgeon: Radiologist, Medication, MD;  Location: [facility] OR;  Service: Radiology;  Laterality: N/A;  . WRIST SURGERY      There were no vitals filed for this visit.      Subjective Assessment - 05/11/17 1453    Subjective No new complaints. No falls or pain to report.    Patient is accompained by: Family member  [name]   Patient Stated Goals "Standing up straight; getting my self-control" (balance)   Currently in Pain? No/denies   Pain Score 0-No pain            [facility] PT Assessment - 05/11/17 1454      Functional Gait  Assessment   Gait assessed  Yes   Gait Level Surface Walks 20 ft, slow speed, abnormal gait pattern, evidence for imbalance or deviates 10-15 in outside of the 12 in walkway width. Requires more than 7 sec to ambulate 20 ft.  13.06 sec's    Change in Gait Speed Makes only minor adjustments to walking speed, or accomplishes a change in speed with significant gait deviations, deviates 10-15 in outside the 12 in walkway width, or changes speed but loses balance but is able to recover and continue walking.   Gait with Horizontal Head Turns Performs head turns smoothly with slight change in gait velocity (eg, minor disruption to smooth gait path),  deviates 6-10 in outside 12 in walkway width, or uses an assistive device.   Gait with Vertical Head Turns Performs task with slight change in gait velocity (eg, minor disruption to smooth gait path), deviates 6 - 10 in outside 12 in walkway width or uses assistive device   Gait and Pivot Turn Turns slowly, requires verbal cueing, or requires several small steps to catch balance following turn and stop   Step Over Obstacle Cannot perform without assistance.   Gait with Narrow Base of Support Ambulates less than 4 steps heel to toe or cannot perform without assistance.   Gait with Eyes Closed Walks 20 ft, slow speed, abnormal gait pattern, evidence for  imbalance, deviates 10-15 in outside 12 in walkway width. Requires more than 9 sec to ambulate 20 ft.   Ambulating Backwards Cannot walk 20 ft without assistance, severe gait deviations or imbalance, deviates greater than 15 in outside 12 in walkway width or will not attempt task.   Steps Two feet to a stair, must use rail.   Total Score 9   FGA comment: <19 = high fall risk     issued the following to HEP: Functional Quadriceps: Sit to Stand    Sit on edge of chair, feet flat on floor and arms across chest. Stand upright, extending knees fully, then slowly sit back down. Repeat 10 times per set. Do 1 set per session. Do 1-2 sessions per day.  http://orth.exer.us/735   Copyright  VHI. All rights reserved.   Holding onto a sturdy surface for balance: kitchen counter top or table, locked rollator:   Toe / Heel Raise (Standing)    Standing with support, raise heels up and hold this position for 5 seconds, lower slowly. Then rock back on heels and raise toes, hold for 5 seconds, then lower down slowly. Repeat _10_ times. 1-2 times a day.  Copyright  VHI. All rights reserved.    Single Leg - Eyes Open    Holding support, lift right leg while maintaining balance over other leg. Progress to removing hands from support surface for longer periods of time. Hold_10_ seconds. Repeat _3_ times each leg per session. Do _1-2_ sessions per day.  Copyright  VHI. All rights reserved.   Perform these in a corner with chair in front of you for safety:  Feet Together (Compliant Surface) Varied Arm Positions - Eyes Closed    Stand on compliant surface: pillow/s with feet together and arms as needed for balance. Close eyes and visualize upright position. Hold_20_ seconds. Repeat _3_ times per session. Do _1-2_ sessions per day.  Copyright  VHI. All rights reserved.   Feet Together (Compliant Surface) Head Motion - Eyes Closed    Stand on compliant surface: _pillow/s_ with feet  together. Close eyes and move head slowly: 1. Up and down x 10 reps 2. Left and right x 10 reps Do _1-2_ sessions per day.  Copyright  VHI. All rights reserved.         PT Education - 05/11/17 1523    Education provided Yes   Education Details results of FGA; HEP for strengthening and balance   Person(s) Educated Patient;Spouse   Methods Explanation;Demonstration;Verbal cues;Handout   Comprehension Verbalized understanding;Returned demonstration;Verbal cues required;Need further instruction          PT Short Term Goals - 05/06/17 1417      PT SHORT TERM GOAL #1   Title Patient (with supervision to min-guard assistance of her spouse) will perform basic HEP for  balance and strengthening. (Target STGs 06/04/17)   Time 4   Period Weeks   Status New   Target Date 06/04/17     PT SHORT TERM GOAL #2   Title Patient will complete FGA with STG and LTG set as appropriate.   Time 1   Period Weeks   Status New   Target Date 05/13/17     PT SHORT TERM GOAL #3   Title Patient will improve TUG with or without a device to <15 seconds to demonstrate a lesser fall risk.    Baseline rollator 18.25; no device 17.81 sec   Time 4   Period Weeks   Status New   Target Date 06/04/17     PT SHORT TERM GOAL #4   Title Patient will improve her gait velocity to 2.62 ft/sec to indicate lesser fall risk with community ambulation.    Baseline 2.25 ft/sec   Time 4   Period Weeks   Status New   Target Date 06/04/17           PT Long Term Goals - 05/06/17 1421      PT LONG TERM GOAL #1   Title Patient and/or husband will be able to verbalize signs and symptoms of CVA and appropriate action to take. (Target LTGs: 07/04/17)   Time 8   Period Weeks   Status New   Target Date 07/04/17     PT LONG TERM GOAL #2   Title Patient (with supervision of husband) will perform updated HEP   Time 8   Period Weeks   Status New   Target Date 07/04/17     PT LONG TERM GOAL #3   Title Patient  will ambulate >=2.79 ft/sec (age based norm) with LRAD or no device on level surface.   Time 8   Period Weeks   Status New   Target Date 07/04/17     PT LONG TERM GOAL #4   Title Patient will ambulate >= 1000 ft on outdoor surfaces (level and unlevel, grass, gravel, mulch) with LRAD and supervision.    Time 8   Period Weeks   Status New   Target Date 07/04/17     PT LONG TERM GOAL #5   Title Patient will perform TUG <13.5 seconds to demonstrate lesser fall risk.   Time 8   Period Weeks   Status New               Plan - 05/11/17 1454    Clinical Impression Statement Today's skilled session established baseline values for FGA and established HEP for strengthening and balance. Pt is progressing toward goals and should benefit from continued PT to progress toward unmet goals.    Rehab Potential Good   Clinical Impairments Affecting Rehab Potential impulsivity and decr awareness of deficits   PT Frequency 2x / week   PT Duration 8 weeks   PT Treatment/Interventions ADLs/Self Care Home Management;DME Instruction;Gait training;Stair training;Functional mobility training;Orthotic Fit/Training;Patient/family education;Cognitive remediation;Neuromuscular re-education;Balance training;Therapeutic exercise;Therapeutic activities   PT Next Visit Plan provide fall prevention information; continue to work on standing balance, gait with/without AD    Consulted and Agree with Plan of Care Patient;Family member/caregiver   Family Member Consulted spouse, [name]      Patient will benefit from skilled therapeutic intervention in order to improve the following deficits and impairments:  Abnormal gait, Decreased balance, Decreased cognition, Decreased knowledge of use of DME, Decreased mobility, Decreased safety awareness, Decreased strength, Difficulty walking, Impaired perceived functional ability,  Impaired sensation, Impaired UE functional use  Visit Diagnosis: Muscle weakness  (generalized)  Hemiplegia and hemiparesis following cerebral infarction affecting right non-dominant side (HCC)  Other abnormalities of gait and mobility  Unsteadiness on feet     Problem List Patient Active Problem List   Diagnosis Date Noted  . CAD (coronary artery disease) 04/12/2017  . Hypertension 04/11/2017  . Right hemiparesis (HCC)   . Aspiration pneumonia of right lung (HCC)   . Leukocytosis   . Hypokalemia   . Labile blood pressure   . Abnormality of gait as late effect of stroke 02/23/2017  . Dysarthria as late effect of stroke 02/23/2017  . Dysphagia as late effect of stroke 02/23/2017  . Acute ischemic left middle cerebral artery (MCA) stroke (HCC) 02/22/2017  . Paroxysmal A-fib (HCC) 02/17/2017  . Atrial fibrillation with RVR (HCC) 02/17/2017  . HLD (hyperlipidemia) 02/17/2017  . Acute hypoxemic respiratory failure (HCC)   . Stroke (cerebrum) (HCC) - Acute MCA infarct due to left CCA/ICA and MCA occlusion, s/p mechanical thrombectomy and left ICA stenting in the setting of newly diagnosed afib 02/14/2017    Sallyanne Kuster, PTA, [facility] [address] [place], [place] [address] [phone] 05/12/17, 1:14 PM   Name: Rane Vaugh MRN: [id number] Date of Birth: 1946/03/16

## 2017-05-12 NOTE — Therapy (Signed)
[facility] [address] [place], [place], [address] Phone: [phone]   Fax:  [phone]  Occupational Therapy Treatment  Patient Details  Name: Kaitlin [name] MRN: [id number] Date of Birth: 26-Dec-1945 Referring Provider: Dr. Wynn Banker  Encounter Date: 05/12/2017      OT End of Session - 05/12/17 1204    Visit Number 3   Number of Visits 17   Date for OT Re-Evaluation 07/04/17   Authorization Type UHC MCR - G code needed   Authorization - Visit Number 3   Authorization - Number of Visits 10   OT Start Time [phone]   OT Stop Time 0929   OT Time Calculation (min) 43 min   Activity Tolerance Patient tolerated treatment well      Past Medical History:  Diagnosis Date  . Abnormality of gait as late effect of stroke 02/23/2017  . Acute hypoxemic respiratory failure (HCC)   . Atrial fibrillation with RVR (HCC) 02/17/2017  . CAD S/P percutaneous coronary angioplasty 2001  . CVA (cerebral vascular accident) (HCC) 01/2017  . Dysphagia as late effect of stroke 02/23/2017  . HCVD (hypertensive cardiovascular disease) 01/2017   normal LVF with moderate LVH, grade 1 DD  . High cholesterol   . Hypokalemia   . Labile blood pressure   . Leukocytosis   . Multiple sclerosis (HCC)   . PAF (paroxysmal atrial fibrillation) (HCC) 01/2017  . Right hemiparesis [facility])     Past Surgical History:  Procedure Laterality Date  . CORONARY ANGIOPLASTY WITH STENT PLACEMENT  11/1999   RCA DES-[facility]  . IR ANGIO INTRA EXTRACRAN SEL COM CAROTID INNOMINATE UNI R MOD SED  02/15/2017  . IR ANGIO VERTEBRAL SEL SUBCLAVIAN INNOMINATE UNI R MOD SED  02/15/2017  . IR INTRAVSC STENT CERV CAROTID W/O EMB-PROT MOD SED INC ANGIO  02/15/2017  . IR PERCUTANEOUS ART THROMBECTOMY/INFUSION INTRACRANIAL INC DIAG ANGIO  02/15/2017  . IR RADIOLOGIST EVAL & MGMT  05/10/2017  . NECK SURGERY    . RADIOLOGY WITH ANESTHESIA N/A 02/14/2017   Procedure: RADIOLOGY WITH  ANESTHESIA;  Surgeon: Radiologist, Medication, MD;  Location: [facility] OR;  Service: Radiology;  Laterality: N/A;  . WRIST SURGERY      There were no vitals filed for this visit.      Subjective Assessment - 05/12/17 0849    Subjective  Pt indicating that it was hard not to use her left hand.   Patient is accompained by: Family member  husband   Pertinent History CVA 02/14/17 PMH: MS for approx 20 years (but no exacerbation for 10 years), A-fib, CAD w/ stent in 2000, white matter dz   Patient Stated Goals to increase balance   Currently in Pain? No/denies                      OT Treatments/Exercises (OP) - 05/12/17 0001      ADLs   ADL Comments Pt's husband brought pictures of shower area today.  Shower is quite large and husband reports that pt inititally stepped in using walker but that they both prefer that he just stabilize her by contact guuard on shoulders. Given pt's apraxia would anticipate that use of walker in shower would be very difficult for pt and husband in agreement.  No place for grab bar for entering or exiting shower and husband states that suction grab bar did not work in the interior of the shower for when pt stands to wash.  Husband has  placed shower seat up against built in shower seat and has placed frame of 3in 1 to pt's left that pt can steady on as needed. Pt can also place R hand flat on wall next to shower seat for balance as well. Given pt's apraxia, lay out of bathroom and issues of using suction grab bar, this appears to be best alternative while pt addreses balance in the clinic. Pt's husband in agreement.      Neurological Re-education Exercises   Other Exercises 1 Neuro re ed to address RUE functional use. Pt presenting with apraxia, impaired sensation, some incoordination and decreased grip strength. With questioning, pt appears to be impacted by apraxia for dressing, functional ambulation (orienting body to sit in chair) and RUE functional use.   Introduced concepts of repetition, bilateral use of hands with familiar functional tasks, allowing time and significant repetition for the brain to problem solve through motor planning, and using interrruption in activity if pt displaying apraxia with tasks. Pt with limited language however pt nodding head and husband able to give numerous examples as well as verbalize understanding.  Constraint induced activities using RUE (L hand wrapped in coban).  Encouraged pt and husband to use wrapping of L hand for limited periods of time at home if pt is engaged in unilateral activities as pt is L hand dominant therefore with apraxia pt''s brain will likely continue to default to using L hand.                  OT Education - 05/12/17 1156    Education provided Yes   Education Details apraxia, strategie to address   Person(s) Educated Patient;Spouse   Methods Explanation;Demonstration;Verbal cues   Comprehension Verbalized understanding          OT Short Term Goals - 05/12/17 1157      OT SHORT TERM GOAL #1   Title Pt/family independent with coordination and putty HEP    Time 4   Period Weeks   Status On-going     OT SHORT TERM GOAL #2   Title Pt/family independent with HEP for RUE shoulder ROM   Time 4   Period Weeks   Status On-going     OT SHORT TERM GOAL #3   Title Pt/family to verbalize understanding with safety with shower transfers and bathing with DME/AE prn   Time 4   Period Weeks   Status On-going     OT SHORT TERM GOAL #4   Title Pt/family to verbalize understanding with memory strategies for sequencing with donning shirt/pants, sequencing and safety with use of walker/rollator, and for medication management   Time 4   Period Weeks   Status On-going     OT SHORT TERM GOAL #5   Title Pt to improve grip strength by 10 lbs Rt hand    Baseline 22 lbs   Time 4   Period Weeks   Status On-going     OT SHORT TERM GOAL #6   Title Pt to improve coordination Rt hand as  evidenced by performing 9 hole peg test to 100 sec. or less   Baseline 2 min. 6 sec   Time 4   Period Weeks   Status On-going           OT Long Term Goals - 05/12/17 1157      OT LONG TERM GOAL #1   Title Pt to retrieve light weight object from high shelf RUE requiring 120 degrees shoulder flexion w/o drops  5/5 trials   Time 8   Period Weeks   Status On-going     OT LONG TERM GOAL #2   Title Pt to perform simple snack prep and light IADLS (washing dishes, laundry tasks) demo safety with walker w/o LOB   Time 8   Period Weeks   Status On-going     OT LONG TERM GOAL #3   Title Pt to implement memory strategies into ADLS including sequencing of donning clothes, walker negotiation, and medication management with supervision and no more than min cues   Time 8   Period Weeks   Status On-going     OT LONG TERM GOAL #4   Title Pt to improve coordination Rt hand as evidenced by performing 9 hole peg test in 80 sec. or less   Baseline 2 min. 6 sec   Time 8   Period Weeks   Status On-going     OT LONG TERM GOAL #5   Title Pt to tie shoes and style hair mod I level with A/E prn   Time 8   Period Weeks   Status On-going               Plan - 05/12/17 1157    Clinical Impression Statement Pt with improved use of RUE with constraint induced approach as well as signfiicant repetition to address apraxia.     Rehab Potential Fair   Current Impairments/barriers affecting progress: severity of deficits including aphasia, cognition   OT Frequency 2x / week   OT Duration 8 weeks   OT Treatment/Interventions Self-care/ADL training;DME and/or AE instruction;Patient/family education;Therapeutic exercises;Therapeutic activities;Functional Mobility Training;Passive range of motion;Cognitive remediation/compensation;Visual/perceptual remediation/compensation;Manual Therapy;Moist Heat   Plan RUE HEP, address apraxia via functional tasks, repetition and task interruption, balance through  functional tasks, incorporate cognition.    Consulted and Agree with Plan of Care Patient;Family member/caregiver   Family Member Consulted husband      Patient will benefit from skilled therapeutic intervention in order to improve the following deficits and impairments:  Decreased coordination, Decreased range of motion, Impaired flexibility, Impaired sensation, Decreased safety awareness, Decreased knowledge of precautions, Impaired tone, Impaired UE functional use, Decreased knowledge of use of DME, Decreased mobility, Decreased balance, Decreased cognition, Decreased strength, Impaired perceived functional ability, Impaired vision/preception  Visit Diagnosis: Hemiplegia and hemiparesis following cerebral infarction affecting right non-dominant side (HCC)  Other lack of coordination  Apraxia  Other symptoms and signs involving cognitive functions following cerebral infarction  Other abnormalities of gait and mobility  Unsteadiness on feet    Problem List Patient Active Problem List   Diagnosis Date Noted  . CAD (coronary artery disease) 04/12/2017  . Hypertension 04/11/2017  . Right hemiparesis (HCC)   . Aspiration pneumonia of right lung (HCC)   . Leukocytosis   . Hypokalemia   . Labile blood pressure   . Abnormality of gait as late effect of stroke 02/23/2017  . Dysarthria as late effect of stroke 02/23/2017  . Dysphagia as late effect of stroke 02/23/2017  . Acute ischemic left middle cerebral artery (MCA) stroke (HCC) 02/22/2017  . Paroxysmal A-fib (HCC) 02/17/2017  . Atrial fibrillation with RVR (HCC) 02/17/2017  . HLD (hyperlipidemia) 02/17/2017  . Acute hypoxemic respiratory failure (HCC)   . Stroke (cerebrum) (HCC) - Acute MCA infarct due to left CCA/ICA and MCA occlusion, s/p mechanical thrombectomy and left ICA stenting in the setting of newly diagnosed afib 02/14/2017    Norton Pastel, OTR/L 05/12/2017, 12:12 PM  [facility] [address] [place], [place], [address] Phone: [phone]   Fax:  [phone]  Name: Kaitlin [name] MRN: [id number] Date of Birth: [birth year]

## 2017-05-12 NOTE — Progress Notes (Signed)
[facility]  PATIENT: Kaitlin [name] DOB: August 28, 1945  REFERRING DOCTOR OR PCP:  Dr. Pearlean Brownie M.D. SOURCE: Patient, hospital notes, images on PACS and CAD. Imaging and lab results.  _________________________________   HISTORICAL  CHIEF COMPLAINT:  Chief Complaint  Patient presents with  . Multiple Sclerosis    Kaitlin [name] is here with her husband Gabriel Rung, to transfer care of MS from Dr. Tinnie Gens to Dr. Epimenio Foot.  Sts. she was dx. in 2000. Presenting sx. was loss of peripheral vision right eye.  Dx. with MRI. Doesn't think she had an LP. Started on Betaseron.  In March 2017, she had elevated lft's.  Since she had not had an exacerbation in 10 yrs., Dr. Tinnie Gens took her off of all dmt. Had a stroke in March and sees Dr. Pearlean Brownie for same, wanted to establish care of MS with Dr. Epimenio Foot in the event she has problems with MS in the  . Gait Disturbance    future.  She denies new or worsening sx. since stopping Betaseron/fim    HISTORY OF PRESENT ILLNESS:  Kaitlin [name] is a 71 year old woman with long history of MS who had a stroke 02/14/2017.   MS:   She was diagnosed in 2001 after presenting with right eye visual loss.   She was referred to Neuro-ophthalmology at [facility] and was referred to Dr. Leotis Shames.    She was placed on Betaseron and did well with no major exacerbation and one minor exacerbation in 2008 treated with IV steroids.    She stopped Betaseron in 2017 due to elevated LFTs.   She has not had any exacerbation since stopping Betaseron.    Her main symptom was fatigue and she was felt to have had a few more small exacerbations not requiring.    Prior to her stroke she walked without a cane and she had very mild right sided weakness.   She was able to do hikes.    She had no bladder issues and vision had returned to baseline.     Her main problems were fatigue related.   She also had difficulty with mental fog and focus/attention.   She would have trouble at times while driving getting from  one point to another.   She had depression on Betaseron and she was placed on Prozac and Effexor and has continued taking these.     Stroke:  She was admitted to [facility] after presenting 02/14/2017 was in right-sided weakness. MRI and CT showed acute left internal capsule/spatial ganglia stroke. The left M1 was hypertensive CT and CTA showed occlusion of the left M1 segment.  The stroke was felt to be embolic and she was placed on Xarelto) into. She had a carotid stent.   She is on AMiodarone also and hr AFib converted to NSR.   No cardiac problem was identified.    REVIEW OF SYSTEMS: Constitutional: No fevers, chills, sweats, or change in appetite Eyes: No visual changes, double vision, eye pain Ear, nose and throat: No hearing loss, ear pain, nasal congestion, sore throat Cardiovascular: No chest pain, palpitations Respiratory: No shortness of breath at rest or with exertion.   No wheezes GastrointestinaI: No nausea, vomiting, diarrhea, abdominal pain, fecal incontinence Genitourinary: No dysuria, urinary retention or frequency.  No nocturia. Musculoskeletal: No neck pain, back pain Integumentary: No rash, pruritus, skin lesions Neurological: as above Psychiatric: No depression at this time.  No anxiety Endocrine: No palpitations, diaphoresis, change in appetite, change in weigh or increased thirst Hematologic/Lymphatic: No anemia,  purpura, petechiae. Allergic/Immunologic: No itchy/runny eyes, nasal congestion, recent allergic reactions, rashes  ALLERGIES: Allergies  Allergen Reactions  . Eliquis [Apixaban] Rash  . Penicillins     HOME MEDICATIONS:  Current Outpatient Prescriptions:  .  amiodarone (PACERONE) 200 MG tablet, Take 1 tablet (200 mg total) by mouth daily., Disp: 30 tablet, Rfl: 0 .  atorvastatin (LIPITOR) 40 MG tablet, Take 1 tablet (40 mg total) by mouth daily at 6 PM., Disp: 30 tablet, Rfl: 0 .  Calcium Citrate-Vitamin D (CALCIUM CITRATE + D PO), Take 1 tablet  by mouth daily., Disp: , Rfl:  .  clopidogrel (PLAVIX) 75 MG tablet, Take 1 tablet (75 mg total) by mouth daily., Disp: 30 tablet, Rfl: 0 .  FLUoxetine (PROZAC) 10 MG capsule, Take 1 capsule (10 mg total) by mouth daily., Disp: 30 capsule, Rfl: 0 .  metoprolol tartrate (LOPRESSOR) 50 MG tablet, Take 1 tablet (50 mg total) by mouth 2 (two) times daily., Disp: 60 tablet, Rfl: 0 .  Multiple Vitamins-Minerals (PRESERVISION AREDS PO), Take 1 tablet by mouth 2 (two) times daily., Disp: , Rfl:  .  pantoprazole (PROTONIX) 40 MG tablet, Take 1 tablet (40 mg total) by mouth daily., Disp: 30 tablet, Rfl: 0 .  Rivaroxaban (XARELTO) 15 MG TABS tablet, Take 1 tablet (15 mg total) by mouth daily with supper., Disp: 30 tablet, Rfl: 0 .  venlafaxine XR (EFFEXOR-XR) 75 MG 24 hr capsule, Take 1 capsule (75 mg total) by mouth daily., Disp: 30 capsule, Rfl: 0  PAST MEDICAL HISTORY: Past Medical History:  Diagnosis Date  . Abnormality of gait as late effect of stroke 02/23/2017  . Acute hypoxemic respiratory failure (HCC)   . Atrial fibrillation with RVR (HCC) 02/17/2017  . CAD S/P percutaneous coronary angioplasty 2001  . CVA (cerebral vascular accident) (HCC) 01/2017  . Dysphagia as late effect of stroke 02/23/2017  . HCVD (hypertensive cardiovascular disease) 01/2017   normal LVF with moderate LVH, grade 1 DD  . High cholesterol   . Hypokalemia   . Labile blood pressure   . Leukocytosis   . Multiple sclerosis (HCC)   . PAF (paroxysmal atrial fibrillation) (HCC) 01/2017  . Right hemiparesis (HCC)     PAST SURGICAL HISTORY: Past Surgical History:  Procedure Laterality Date  . CORONARY ANGIOPLASTY WITH STENT PLACEMENT  11/1999   RCA DES[facility]  . IR ANGIO INTRA EXTRACRAN SEL COM CAROTID INNOMINATE UNI R MOD SED  02/15/2017  . IR ANGIO VERTEBRAL SEL SUBCLAVIAN INNOMINATE UNI R MOD SED  02/15/2017  . IR INTRAVSC STENT CERV CAROTID W/O EMB-PROT MOD SED INC ANGIO  02/15/2017  . IR PERCUTANEOUS ART  THROMBECTOMY/INFUSION INTRACRANIAL INC DIAG ANGIO  02/15/2017  . IR RADIOLOGIST EVAL & MGMT  05/10/2017  . NECK SURGERY    . RADIOLOGY WITH ANESTHESIA N/A 02/14/2017   Procedure: RADIOLOGY WITH ANESTHESIA;  Surgeon: Radiologist, Medication, MD;  Location: [facility] OR;  Service: Radiology;  Laterality: N/A;  . WRIST SURGERY      FAMILY HISTORY: Family History  Problem Relation Age of Onset  . Heart disease Father   . Cancer Father   . Multiple sclerosis Sister   . Cancer Mother     SOCIAL HISTORY:  Social History   Social History  . Marital status: Married    Spouse name: N/A  . Number of children: N/A  . Years of education: N/A   Occupational History  . Not on file.   Social History Main Topics  . Smoking status: Never  Smoker  . Smokeless tobacco: Never Used  . Alcohol use 1.8 oz/week    3 Glasses of wine per week     Comment: wine  . Drug use: No  . Sexual activity: Not on file   Other Topics Concern  . Not on file   Social History Narrative  . No narrative on file     PHYSICAL EXAM  Vitals:   05/12/17 1330  BP: (!) 140/59  Pulse: 60  Resp: 16  Weight: 121 lb 8 oz (55.1 kg)  Height:  (1.575 m)    Body mass index is 22.22 kg/m.   General: The patient is well-developed and well-nourished and in no acute distress   Neck: The neck is supple, no carotid bruits are noted.  The neck is nontender.  Cardiovascular: The heart has a regular rate and rhythm with a normal S1 and S2. There were no murmurs, gallops or rubs. Lungs are clear to auscultation.  Skin: Extremities are without significant edema or rash    Neurologic Exam  Mental status: The patient is alert and oriented x 2 (3/20/8 as date) at the time of the examination. The patient has apparent normal recent and remote memory [facility]), with an apparently normal attention span and concentration ability.   Speech shows mild word finding difficulty.  Cranial nerves: Extraocular movements are  full. Pupils are equal, round, and reactive to light and accomodation.  Visual fields are full.  Facial symmetry is present. She reports reduced touch and temperature sensation in the right base.Facial strength is normal.  Trapezius and sternocleidomastoid strength is normal. No dysarthria is noted.  The tongue is midline, and the patient has symmetric elevation of the soft palate. No obvious hearing deficits are noted.  Motor:  Muscle bulk is normal.   Tone is mildly increased on the right. Strength is 4+/5  Sensory: She reports reduced sensation to touch, temperature and vibration on the right relative to the left..  Coordination: Cerebellar testing reveals good left and mildly reduced right finger-nose-finger and heel-to-shin bilaterally.  Gait and station: Station is normal.   She can rise out of the chair without difficulty. It was slightly reduced. Her turn was unstable. There is no Romberg sign.  Reflexes: Deep tendon reflexes are slightly increased in the right arm and increased in both legs, right greater than left..   Plantar responses are flexor.    DIAGNOSTIC DATA (LABS, IMAGING, TESTING) - I reviewed patient records, labs, notes, testing and imaging myself where available.  Lab Results  Component Value Date   WBC 7.2 03/15/2017   HGB 9.6 (L) 03/15/2017   HCT 28.4 (L) 03/15/2017   MCV 94.7 03/15/2017   PLT 247 03/15/2017      Component Value Date/Time   NA 137 03/15/2017 0459   K 4.7 03/15/2017 0459   CL 106 03/15/2017 0459   CO2 23 03/15/2017 0459   GLUCOSE 103 (H) 03/15/2017 0459   BUN 19 03/15/2017 0459   CREATININE 0.80 03/15/2017 0459   CALCIUM 8.5 (L) 03/15/2017 0459   PROT 6.2 (L) 02/23/2017 0556   ALBUMIN 2.7 (L) 02/23/2017 0556   AST 60 (H) 02/23/2017 0556   ALT 81 (H) 02/23/2017 0556   ALKPHOS 70 02/23/2017 0556   BILITOT 0.5 02/23/2017 0556   GFRNONAA >60 03/15/2017 0459   GFRAA >60 03/15/2017 0459   Lab Results  Component Value Date   CHOL 173  02/15/2017   HDL 70 02/15/2017   LDLCALC 74 02/15/2017  TRIG 145 02/15/2017   CHOLHDL 2.5 02/15/2017   Lab Results  Component Value Date   HGBA1C 5.2 02/15/2017   No results found for: VITAMINB12 No results found for: TSH     ASSESSMENT AND PLAN  Cerebrovascular accident (CVA) due to thrombosis of precerebral artery (HCC)  Multiple sclerosis (HCC)  Right hemiparesis (HCC)  Abnormality of gait as late effect of stroke    1.   She will continue to see Dr. Pearlean Brownie for stroke related issues he has placed her on Xarelto and she is also on amiodarone for her A. Fib 2.    Her MS appeared to have been stable for many years on Betaseron and she has not had any exacerbations over the last year and a half that she has been off of Betaseron therefore, I think it is reasonable to remain off of a disease modifying therapy. Sometime next year we will check another MRI and compare side-by-side her latest 1. There are significant changes consistent with MS and we will revisit the use of a disease modifying therapy. 3.    Discussed that if cognitive problems and fatigue remain an issue that we could consider a low-dose of the stimulants such as Ritalin or Provigil/Nuvigil as it might help some of the symptoms.    4.    She will return to see me in 6 months or sooner if there are new or worsening neurologic symptoms.  45 minute face-to-face evaluation with greater than one half the time counseling and coordinating care about her MS and related symptoms.   Richard A. Epimenio Foot, MD, [facility] 05/12/2017, 2:50 PM Certified in Neurology, Clinical Neurophysiology, Sleep Medicine, Pain Medicine and Neuroimaging  [facility] [address], [address] [place], [place] [address] [phone]

## 2017-05-12 NOTE — Therapy (Signed)
[facility] [address] [place], [place], [address] Phone: [phone]   Fax:  [phone]  Speech Language Pathology Treatment  Patient Details  Name: Kaitlin [name] MRN: [id number] Date of Birth: [birth year] Referring Provider: Claudette Laws, MD  Encounter Date: 05/12/2017      End of Session - 05/12/17 1418    Visit Number 2   Number of Visits 17   Date for SLP Re-Evaluation 07/11/17   SLP Start Time 1016   SLP Stop Time  1100   SLP Time Calculation (min) 44 min   Activity Tolerance Patient tolerated treatment well      Past Medical History:  Diagnosis Date  . Abnormality of gait as late effect of stroke 02/23/2017  . Acute hypoxemic respiratory failure (HCC)   . Atrial fibrillation with RVR (HCC) 02/17/2017  . CAD S/P percutaneous coronary angioplasty 2001  . CVA (cerebral vascular accident) (HCC) 01/2017  . Dysphagia as late effect of stroke 02/23/2017  . HCVD (hypertensive cardiovascular disease) 01/2017   normal LVF with moderate LVH, grade 1 DD  . High cholesterol   . Hypokalemia   . Labile blood pressure   . Leukocytosis   . Multiple sclerosis (HCC)   . PAF (paroxysmal atrial fibrillation) (HCC) 01/2017  . Right hemiparesis [facility])     Past Surgical History:  Procedure Laterality Date  . CORONARY ANGIOPLASTY WITH STENT PLACEMENT  11/1999   [facility]  . IR ANGIO INTRA EXTRACRAN SEL COM CAROTID INNOMINATE UNI R MOD SED  02/15/2017  . IR ANGIO VERTEBRAL SEL SUBCLAVIAN INNOMINATE UNI R MOD SED  02/15/2017  . IR INTRAVSC STENT CERV CAROTID W/O EMB-PROT MOD SED INC ANGIO  02/15/2017  . IR PERCUTANEOUS ART THROMBECTOMY/INFUSION INTRACRANIAL INC DIAG ANGIO  02/15/2017  . IR RADIOLOGIST EVAL & MGMT  05/10/2017  . NECK SURGERY    . RADIOLOGY WITH ANESTHESIA N/A 02/14/2017   Procedure: RADIOLOGY WITH ANESTHESIA;  Surgeon: Radiologist, Medication, MD;  Location: [facility] OR;  Service: Radiology;  Laterality:  N/A;  . WRIST SURGERY      There were no vitals filed for this visit.      Subjective Assessment - 05/12/17 1019    Subjective "He told me what I needed to do and I needed to get to work."   Currently in Pain? No/denies               ADULT SLP TREATMENT - 05/12/17 1021      General Information   Behavior/Cognition Alert;Cooperative;Pleasant mood   Patient Positioning Upright in chair     Treatment Provided   Treatment provided Cognitive-Linquistic     Pain Assessment   Pain Assessment No/denies pain     Cognitive-Linquistic Treatment   Treatment focused on Aphasia   Skilled Treatment Pt generated 8 items for simple categories with usual mod-max A(description, first-letter and phonemic cues) from SLP; frequently looked to her husband to ask for help. SLP demo'd use of compensations for aphasia in conversation, focusing on description, drawing, and use of writing to clarify pt's message. Educated pt and her husband re: using these strategies to cue pt to retrieve words/convey her message In 5 minutes simple conversation, pt required usual mod A from SLP, occasionally requesting assistance from spouse, to relay simple personal details (hobbies/interests). Simple category cross-out with initial min-mod A fading to rare min A.      Assessment / Recommendations / Plan   Plan Continue with current plan of care  Progression Toward Goals   Progression toward goals Progressing toward goals          SLP Education - 05/12/17 1422    Education provided Yes   Education Details try to incorporate use of calendar into regular routine, cuing strategies/supportive conversation techniques for aphasia   Person(s) Educated Patient;Spouse   Methods Explanation   Comprehension Verbalized understanding          SLP Short Term Goals - 05/12/17 1312      SLP SHORT TERM GOAL #1   Title pt will generate 12 items in a simple category with compensations allowed   Time 4   Period  Weeks   Status On-going     SLP SHORT TERM GOAL #2   Title pt will provide sentence level responses using language compensations 90% success   Time 4   Period Weeks   Status On-going     SLP SHORT TERM GOAL #3   Title pt will demo understanding of 5 minutes mod complex conversation, by providing appropriate responses in conversation 95% over three sessions   Time 4   Period Weeks   Status On-going          SLP Long Term Goals - 05/12/17 1417      SLP LONG TERM GOAL #1   Title pt will demo appropriate breath support for adequate speech volume in 10 minutes simple conversation    Time 8   Period Weeks   Status On-going     SLP LONG TERM GOAL #2   Title pt will demo understanding of 10 minutes mod-complex conversation with request for repeats PRN   Time 8   Period Weeks   Status On-going     SLP LONG TERM GOAL #3   Title Pt will complete mod complex/complex naming tasks with occasional min A   Time 8   Period Weeks   Status On-going     SLP LONG TERM GOAL #4   Title pt will produce 10 minutes simple-mod complex conversation using compensations for anomia over three sessions   Time 8   Period Weeks   Status On-going          Plan - 05/12/17 1418    Clinical Impression Statement Pt presents today with mod expressive aphasia, and some receptive aphasia. Pt required usual mod-max A for anomia compensations in simple conversation, continues to ask husband for assistance. He reports attempting to cue pt at home this past week vs supplying words. Pt also with premorbid memory deficits but husband states these are exacerbated since CVA, and compensations will be addressed if not in occupational therapy. Pt's husband showed SLP binder/planner but states they were unable to use it this week. Pt will receive skilled ST to address aphasia, dysarthria (volume), and possibly memory should OT not address.    Speech Therapy Frequency 2x / week   Treatment/Interventions Language  facilitation;Internal/external aids;Compensatory techniques;SLP instruction and feedback;Multimodal communcation approach;Cognitive reorganization;Functional tasks;Cueing hierarchy;Compensatory strategies;Patient/family education   Potential to Achieve Goals Fair   Potential Considerations Co-morbidities;Severity of impairments;Ability to learn/carryover information;Previous level of function   Consulted and Agree with Plan of Care Patient;Family member/caregiver   Family Member Consulted husband      Patient will benefit from skilled therapeutic intervention in order to improve the following deficits and impairments:   Aphasia  Dysarthria and anarthria  Cognitive communication deficit    Problem List Patient Active Problem List   Diagnosis Date Noted  . CAD (coronary artery disease) 04/12/2017  .  Hypertension 04/11/2017  . Right hemiparesis (HCC)   . Aspiration pneumonia of right lung (HCC)   . Leukocytosis   . Hypokalemia   . Labile blood pressure   . Abnormality of gait as late effect of stroke 02/23/2017  . Dysarthria as late effect of stroke 02/23/2017  . Dysphagia as late effect of stroke 02/23/2017  . Acute ischemic left middle cerebral artery (MCA) stroke (HCC) 02/22/2017  . Paroxysmal A-fib (HCC) 02/17/2017  . Atrial fibrillation with RVR (HCC) 02/17/2017  . HLD (hyperlipidemia) 02/17/2017  . Acute hypoxemic respiratory failure (HCC)   . Stroke (cerebrum) (HCC) - Acute MCA infarct due to left CCA/ICA and MCA occlusion, s/p mechanical thrombectomy and left ICA stenting in the setting of newly diagnosed afib 02/14/2017   Rondel Baton, MS, CCC-SLP Speech-Language Pathologist  Arlana Lindau 05/12/2017, 2:23 PM  [facility] [address] [place], [place], [address] Phone: [phone]   Fax:  [phone]   Name: Lashonda Comtois MRN: [id number] Date of Birth: 10-Feb-1946

## 2017-05-13 ENCOUNTER — Ambulatory Visit: Payer: Medicare Other | Admitting: Physical Therapy

## 2017-05-17 ENCOUNTER — Ambulatory Visit: Payer: Medicare Other | Admitting: Occupational Therapy

## 2017-05-17 ENCOUNTER — Encounter: Payer: Self-pay | Admitting: Occupational Therapy

## 2017-05-17 ENCOUNTER — Ambulatory Visit: Payer: Medicare Other | Admitting: Physical Therapy

## 2017-05-17 ENCOUNTER — Encounter: Payer: Self-pay | Admitting: Physical Therapy

## 2017-05-17 DIAGNOSIS — R482 Apraxia: Secondary | ICD-10-CM

## 2017-05-17 DIAGNOSIS — R2689 Other abnormalities of gait and mobility: Secondary | ICD-10-CM

## 2017-05-17 DIAGNOSIS — R2681 Unsteadiness on feet: Secondary | ICD-10-CM

## 2017-05-17 DIAGNOSIS — M6281 Muscle weakness (generalized): Secondary | ICD-10-CM

## 2017-05-17 DIAGNOSIS — R4701 Aphasia: Secondary | ICD-10-CM | POA: Diagnosis not present

## 2017-05-17 DIAGNOSIS — I69318 Other symptoms and signs involving cognitive functions following cerebral infarction: Secondary | ICD-10-CM

## 2017-05-17 DIAGNOSIS — R278 Other lack of coordination: Secondary | ICD-10-CM

## 2017-05-17 DIAGNOSIS — I69353 Hemiplegia and hemiparesis following cerebral infarction affecting right non-dominant side: Secondary | ICD-10-CM

## 2017-05-17 NOTE — Therapy (Signed)
[facility] [address] [place], [place], [address] Phone: [phone]   Fax:  [phone]  Physical Therapy Treatment  Patient Details  Name: Kaitlin [name] MRN: [id number] Date of Birth: May 23, 1946 Referring Provider: Claudette Laws MD  Encounter Date: 05/17/2017      PT End of Session - 05/17/17 1942    Visit Number 3   Number of Visits 17   Date for PT Re-Evaluation 07/04/17   Authorization Type UHC MCR   Authorization Time Period 05/05/17  to  07/04/17   PT Start Time 0800   PT Stop Time 0845   PT Time Calculation (min) 45 min   Equipment Utilized During Treatment Gait belt   Activity Tolerance Patient tolerated treatment well   Behavior During Therapy [facility] for tasks assessed/performed      Past Medical History:  Diagnosis Date  . Abnormality of gait as late effect of stroke 02/23/2017  . Acute hypoxemic respiratory failure (HCC)   . Atrial fibrillation with RVR (HCC) 02/17/2017  . CAD S/P percutaneous coronary angioplasty 2001  . CVA (cerebral vascular accident) (HCC) 01/2017  . Dysphagia as late effect of stroke 02/23/2017  . HCVD (hypertensive cardiovascular disease) 01/2017   normal LVF with moderate LVH, grade 1 DD  . High cholesterol   . Hypokalemia   . Labile blood pressure   . Leukocytosis   . Multiple sclerosis (HCC)   . PAF (paroxysmal atrial fibrillation) (HCC) 01/2017  . Right hemiparesis [facility])     Past Surgical History:  Procedure Laterality Date  . CORONARY ANGIOPLASTY WITH STENT PLACEMENT  11/1999   RCA DES-[facility]  . IR ANGIO INTRA EXTRACRAN SEL COM CAROTID INNOMINATE UNI R MOD SED  02/15/2017  . IR ANGIO VERTEBRAL SEL SUBCLAVIAN INNOMINATE UNI R MOD SED  02/15/2017  . IR INTRAVSC STENT CERV CAROTID W/O EMB-PROT MOD SED INC ANGIO  02/15/2017  . IR PERCUTANEOUS ART THROMBECTOMY/INFUSION INTRACRANIAL INC DIAG ANGIO  02/15/2017  . IR RADIOLOGIST EVAL & MGMT  05/10/2017  . NECK SURGERY     . RADIOLOGY WITH ANESTHESIA N/A 02/14/2017   Procedure: RADIOLOGY WITH ANESTHESIA;  Surgeon: Radiologist, Medication, MD;  Location: [facility] OR;  Service: Radiology;  Laterality: N/A;  . WRIST SURGERY      There were no vitals filed for this visit.      Subjective Assessment - 05/17/17 0759    Subjective No new complaints; pt denies falls. Husband reports fall in laundry room. Cut her arm and had trouble getting it to stop bleeding. It took >24 hrs to get it to stop bleeding.    Patient is accompained by: Family member  Kaitlin [name]   Patient Stated Goals "Standing up straight; getting my self-control" (balance)   Currently in Pain? No/denies                         [facility] Adult PT Treatment/Exercise - 05/17/17 1135      Transfers   Transfers Sit to Stand;Stand to Sit   Sit to Stand 4: Min guard   Sit to Stand Details (indicate cue type and reason) instructional cues for safe use of RW with each transfer    Stand to Sit 4: Min assist   Stand to Sit Details instructional cues and hands-on assist to appropriately maneuver RW to prepare for stand to sit (pt tends to park it off to the side)     Ambulation/Gait   Ambulation/Gait Assistance 4: Min assist;4:  Min guard   Ambulation/Gait Assistance Details no device pt with drifing off path and imbalance with head turns; remainder of session focused on use of 2 wheel RW (which she uses inside home)    Ambulation Distance (Feet) 100 Feet  120, 240, 100, 50   Assistive device Rolling walker   Gait Pattern Step-through pattern;Decreased step length - left;Decreased stance time - right;Right foot flat;Trunk flexed;Poor foot clearance - right;Decreased step length - right;Left foot flat;Shuffle   Ambulation Surface Level     Exercises   Exercises Knee/Hip     Knee/Hip Exercises: Standing   Heel Raises Both;1 set;10 reps;5 seconds   Heel Raises Limitations alternating with toe raises; bil UE support on counter; husband educated to be  with patient when doing this exercise; he reports they have a gait belt at home he can use   SLS progressed bil UE support to single UE support to single finger support to no support; 5 reps each leg x 10 sec each             Balance Exercises - 05/17/17 1142      Balance Exercises: Standing   Standing Eyes Closed Narrow base of support (BOS);Head turns;Foam/compliant surface;30 secs  significant LOB to lt with EC feet together; no righting rxn   Balance Beam black beam, feet apart EO goal of 30 seconds, 4 attempts; educated when LOB occurs to take a big step and try to stop herself with one step only--pt with good understanding and return demonstrated fwd/bckwd multiple times)              PT Short Term Goals - 05/17/17 1955      PT SHORT TERM GOAL #1   Title Patient (with supervision to min-guard assistance of her spouse) will perform basic HEP for balance and strengthening. (Target STGs 06/04/17)   Time 4   Period Weeks   Status New     PT SHORT TERM GOAL #2   Title Patient will complete FGA with STG and LTG set as appropriate.   Baseline 10/10 FGA 9/30; 10/16 goal set   Time 1   Period Weeks   Status Achieved     PT SHORT TERM GOAL #3   Title Patient will improve TUG with or without a device to <15 seconds to demonstrate a lesser fall risk.    Baseline rollator 18.25; no device 17.81 sec   Time 4   Period Weeks   Status New     PT SHORT TERM GOAL #4   Title Patient will improve her gait velocity to 2.62 ft/sec to indicate lesser fall risk with community ambulation.    Baseline 2.25 ft/sec   Time 4   Period Weeks   Status New     PT SHORT TERM GOAL #5   Title Patient will improve FGA score to >=14/30 to demonstrate progress towards lesser fall risk   Time 3   Period Weeks   Status New   Target Date 06/04/17           PT Long Term Goals - 05/17/17 1959      PT LONG TERM GOAL #1   Title Patient and/or husband will be able to verbalize signs and  symptoms of CVA and appropriate action to take. (Target LTGs: 07/04/17)   Time 8   Period Weeks   Status New     PT LONG TERM GOAL #2   Title Patient (with supervision of husband) will perform updated HEP  Time 8   Period Weeks   Status New     PT LONG TERM GOAL #3   Title Patient will ambulate >=2.79 ft/sec (age based norm) with LRAD or no device on level surface.   Time 8   Period Weeks   Status New     PT LONG TERM GOAL #4   Title Patient will ambulate >= 1000 ft on outdoor surfaces (level and unlevel, grass, gravel, mulch) with LRAD and supervision.    Time 8   Period Weeks   Status New     PT LONG TERM GOAL #5   Title Patient will perform TUG <13.5 seconds to demonstrate lesser fall risk.   Time 8   Period Weeks   Status New     Additional Long Term Goals   Additional Long Term Goals Yes     PT LONG TERM GOAL #6   Title Patient will improve FGA score by 4 points above her score assessed at STGs (TBA).   Baseline 10/16 goal set per STG set on eval   Time 7   Period Weeks   Status New               Plan - 05/17/17 1943    Clinical Impression Statement Session focused on gait, safe use of DME (focusing on 2 wheel RW which pt uses inside the home), and balance training. Patient and husband advised that she must use RW at all times (reviewed FGA score of 9/30 from last session). Instructed she should not attempt tasks that require 2 hands to complete in a standing position (ex. folding laundry to be done in sitting). Patient with episodes of loss of balance or drifting to her left, ? due to inattention/distraction. Patient will require multiple repetitions of correct sequencing with plan to educate husband on cuing to assist with carryover.    Rehab Potential Good   Clinical Impairments Affecting Rehab Potential impulsivity and decr awareness of deficits   PT Frequency 2x / week   PT Duration 8 weeks   PT Treatment/Interventions ADLs/Self Care Home Management;DME  Instruction;Gait training;Stair training;Functional mobility training;Orthotic Fit/Training;Patient/family education;Cognitive remediation;Neuromuscular re-education;Balance training;Therapeutic exercise;Therapeutic activities   PT Next Visit Plan continue to work on safety with RW and educating husband on sequence/cues; standing balance in reaching and wt-shifting tasks with single UE support   Consulted and Agree with Plan of Care Patient;Family member/caregiver   Family Member Consulted spouse, Kaitlin [name]      Patient will benefit from skilled therapeutic intervention in order to improve the following deficits and impairments:  Abnormal gait, Decreased balance, Decreased cognition, Decreased knowledge of use of DME, Decreased mobility, Decreased safety awareness, Decreased strength, Difficulty walking, Impaired perceived functional ability, Impaired sensation, Impaired UE functional use  Visit Diagnosis: Other abnormalities of gait and mobility  Unsteadiness on feet     Problem List Patient Active Problem List   Diagnosis Date Noted  . Multiple sclerosis (HCC) 05/12/2017  . CAD (coronary artery disease) 04/12/2017  . Hypertension 04/11/2017  . Right hemiparesis (HCC)   . Aspiration pneumonia of right lung (HCC)   . Leukocytosis   . Hypokalemia   . Labile blood pressure   . Abnormality of gait as late effect of stroke 02/23/2017  . Dysarthria as late effect of stroke 02/23/2017  . Dysphagia as late effect of stroke 02/23/2017  . Acute ischemic left middle cerebral artery (MCA) stroke (HCC) 02/22/2017  . Paroxysmal A-fib (HCC) 02/17/2017  . Atrial fibrillation with RVR (HCC)  02/17/2017  . HLD (hyperlipidemia) 02/17/2017  . Acute hypoxemic respiratory failure (HCC)   . Stroke (cerebrum) (HCC) - Acute MCA infarct due to left CCA/ICA and MCA occlusion, s/p mechanical thrombectomy and left ICA stenting in the setting of newly diagnosed afib 02/14/2017    Zena Amos, PT 05/17/2017,  8:02 PM  [facility] [address] [place], [place], [address] Phone: [phone]   Fax:  [phone]  Name: Kaitlin [name] MRN: [id number] Date of Birth: 1946/05/03

## 2017-05-17 NOTE — Therapy (Signed)
[facility] [address] [place], [place], [address] Phone: [phone]   Fax:  [phone]  Occupational Therapy Treatment  Patient Details  Name: Kaitlin [name] MRN: [id number] Date of Birth: [birth year] Referring Provider: Dr. Wynn Banker  Encounter Date: 05/17/2017      OT End of Session - 05/17/17 1248    Visit Number 4   Number of Visits 17   Date for OT Re-Evaluation 07/04/17   Authorization Type UHC MCR - G code needed   Authorization - Visit Number 4   Authorization - Number of Visits 10   OT Start Time 0847   OT Stop Time 0929   OT Time Calculation (min) 42 min   Activity Tolerance Patient tolerated treatment well      Past Medical History:  Diagnosis Date  . Abnormality of gait as late effect of stroke 02/23/2017  . Acute hypoxemic respiratory failure (HCC)   . Atrial fibrillation with RVR (HCC) 02/17/2017  . CAD S/P percutaneous coronary angioplasty 2001  . CVA (cerebral vascular accident) (HCC) 01/2017  . Dysphagia as late effect of stroke 02/23/2017  . HCVD (hypertensive cardiovascular disease) 01/2017   normal LVF with moderate LVH, grade 1 DD  . High cholesterol   . Hypokalemia   . Labile blood pressure   . Leukocytosis   . Multiple sclerosis (HCC)   . PAF (paroxysmal atrial fibrillation) (HCC) 01/2017  . Right hemiparesis [facility])     Past Surgical History:  Procedure Laterality Date  . CORONARY ANGIOPLASTY WITH STENT PLACEMENT  11/1999   [facility]CA DES[facility]  . IR ANGIO INTRA EXTRACRAN SEL COM CAROTID INNOMINATE UNI R MOD SED  02/15/2017  . IR ANGIO VERTEBRAL SEL SUBCLAVIAN INNOMINATE UNI R MOD SED  02/15/2017  . IR INTRAVSC STENT CERV CAROTID W/O EMB-PROT MOD SED INC ANGIO  02/15/2017  . IR PERCUTANEOUS ART THROMBECTOMY/INFUSION INTRACRANIAL INC DIAG ANGIO  02/15/2017  . IR RADIOLOGIST EVAL & MGMT  05/10/2017  . NECK SURGERY    . RADIOLOGY WITH ANESTHESIA N/A 02/14/2017   Procedure: RADIOLOGY WITH  ANESTHESIA;  Surgeon: Radiologist, Medication, MD;  Location: [facility] OR;  Service: Radiology;  Laterality: N/A;  . WRIST SURGERY      There were no vitals filed for this visit.      Subjective Assessment - 05/17/17 0852    Subjective  Pt's husband reports pt fell while standing without the walker to fold laundry.  Pt with large bruise and small cut on L forearm (ventral aspect).    Patient is accompained by: Family member  husband   Pertinent History CVA 02/14/17 PMH: MS for approx 20 years (but no exacerbation for 10 years), A-fib, CAD w/ stent in 2000, white matter dz   Patient Stated Goals to increase balance   Currently in Pain? No/denies                      OT Treatments/Exercises (OP) - 05/17/17 1239      ADLs   LB Dressing Addressed UB and LB dressing (donning and doffing) - pt requires assist due to apraxia, R inattention, impaired cognition, impaired sensation.  Pt benefits from selective input to start activity, followed by allowing pt to process thorugh activity (vs insisting on hemi dressing techniques as pt and husband were previously taught perhaps when R hemiplegia was more pronounced).  Pt required moderate cueing, demonstrationg and benefits from interruption of task (STOP) when apraxia interferes. Pt also benefitted from rote practice  and consistent use of tactile and verbal cues.  Husband present and verbalized understanding for all concepts.  Strategies provided in writing and pt's husband stated he felt he could carry this through at home.  "This is the best session she has had for dressing so I think I can do this at home."             Balance Exercises - 05/17/17 1142      Balance Exercises: Standing   Standing Eyes Closed Narrow base of support (BOS);Head turns;Foam/compliant surface;30 secs  significant LOB to lt with EC feet together; no righting rxn   Balance Beam black beam, feet apart EO goal of 30 seconds, 4 attempts; educated when LOB occurs  to take a big step and try to stop herself with one step only--pt with good understanding and return demonstrated fwd/bckwd multiple times)            OT Education - 05/17/17 1244    Education provided Yes   Education Details dresssing strategies   Person(s) Educated Patient;Spouse   Methods Explanation;Demonstration;Verbal cues;Handout   Comprehension Verbalized understanding;Returned demonstration  husband          OT Short Term Goals - 05/17/17 1244      OT SHORT TERM GOAL #1   Title Pt/family independent with coordination and putty HEP    Time 4   Period Weeks   Status On-going     OT SHORT TERM GOAL #2   Title Pt/family independent with HEP for RUE shoulder ROM   Time 4   Period Weeks   Status On-going     OT SHORT TERM GOAL #3   Title Pt/family to verbalize understanding with safety with shower transfers and bathing with DME/AE prn   Time 4   Period Weeks   Status Achieved  given lay out of bathroom pt and husband are utilizing safest strategy at this time.      OT SHORT TERM GOAL #4   Title Pt/family to verbalize understanding with memory strategies for sequencing with donning shirt/pants, sequencing and safety with use of walker/rollator, and for medication management   Time 4   Period Weeks   Status On-going     OT SHORT TERM GOAL #5   Title Pt to improve grip strength by 10 lbs Rt hand    Baseline 22 lbs   Time 4   Period Weeks   Status On-going     OT SHORT TERM GOAL #6   Title Pt to improve coordination Rt hand as evidenced by performing 9 hole peg test to 100 sec. or less   Baseline 2 min. 6 sec   Time 4   Period Weeks   Status On-going           OT Long Term Goals - 05/17/17 1245      OT LONG TERM GOAL #1   Title Pt to retrieve light weight object from high shelf RUE requiring 120 degrees shoulder flexion w/o drops 5/5 trials   Time 8   Period Weeks   Status On-going     OT LONG TERM GOAL #2   Title Pt to perform simple snack  prep and light IADLS (washing dishes, laundry tasks) demo safety with walker w/o LOB   Time 8   Period Weeks   Status On-going     OT LONG TERM GOAL #3   Title Pt to implement memory strategies into ADLS including sequencing of donning clothes, walker negotiation, and medication  management with supervision and no more than min cues   Time 8   Period Weeks   Status On-going     OT LONG TERM GOAL #4   Title Pt to improve coordination Rt hand as evidenced by performing 9 hole peg test in 80 sec. or less   Baseline 2 min. 6 sec   Time 8   Period Weeks   Status On-going     OT LONG TERM GOAL #5   Title Pt to tie shoes and style hair mod I level with A/E prn   Time 8   Period Weeks   Status On-going               Plan - 05/17/17 1245    Clinical Impression Statement Pt progressing toward goals. Pt benefits from rote practice and specific interventions for dressing   Rehab Potential Fair   Current Impairments/barriers affecting progress: severity of deficits including aphasia, cognition   OT Frequency 2x / week   OT Duration 8 weeks   OT Treatment/Interventions Self-care/ADL training;DME and/or AE instruction;Patient/family education;Therapeutic exercises;Therapeutic activities;Functional Mobility Training;Passive range of motion;Cognitive remediation/compensation;Visual/perceptual remediation/compensation;Manual Therapy;Moist Heat   Plan check dressing, address eating if necessary, address apraxia via functional tasks, repetition and task interruption, balance through functional tasks, incorporate cognition   Consulted and Agree with Plan of Care Patient;Family member/caregiver   Family Member Consulted husband      Patient will benefit from skilled therapeutic intervention in order to improve the following deficits and impairments:  Decreased coordination, Decreased range of motion, Impaired flexibility, Impaired sensation, Decreased safety awareness, Decreased knowledge of  precautions, Impaired tone, Impaired UE functional use, Decreased knowledge of use of DME, Decreased mobility, Decreased balance, Decreased cognition, Decreased strength, Impaired perceived functional ability, Impaired vision/preception  Visit Diagnosis: Hemiplegia and hemiparesis following cerebral infarction affecting right non-dominant side (HCC)  Other lack of coordination  Apraxia  Other symptoms and signs involving cognitive functions following cerebral infarction  Unsteadiness on feet  Muscle weakness (generalized)    Problem List Patient Active Problem List   Diagnosis Date Noted  . Multiple sclerosis (HCC) 05/12/2017  . CAD (coronary artery disease) 04/12/2017  . Hypertension 04/11/2017  . Right hemiparesis (HCC)   . Aspiration pneumonia of right lung (HCC)   . Leukocytosis   . Hypokalemia   . Labile blood pressure   . Abnormality of gait as late effect of stroke 02/23/2017  . Dysarthria as late effect of stroke 02/23/2017  . Dysphagia as late effect of stroke 02/23/2017  . Acute ischemic left middle cerebral artery (MCA) stroke (HCC) 02/22/2017  . Paroxysmal A-fib (HCC) 02/17/2017  . Atrial fibrillation with RVR (HCC) 02/17/2017  . HLD (hyperlipidemia) 02/17/2017  . Acute hypoxemic respiratory failure (HCC)   . Stroke (cerebrum) (HCC) - Acute MCA infarct due to left CCA/ICA and MCA occlusion, s/p mechanical thrombectomy and left ICA stenting in the setting of newly diagnosed afib 02/14/2017    Norton Pastel, OTR/L 05/17/2017, 12:49 PM  [facility] [address] [place], [place], [address] Phone: [phone]   Fax:  [phone]  Name: Kaitlin [name] MRN: [id number] Date of Birth: 1945-09-15

## 2017-05-17 NOTE — Patient Instructions (Addendum)
Fall Prevention in the Home Falls can cause injuries and can affect people from all age groups. There are many simple things that you can do to make your home safe and to help prevent falls. What can I do on the outside of my home?  Regularly repair the edges of walkways and driveways and fix any cracks.  Remove high doorway thresholds.  Trim any shrubbery on the main path into your home.  Use bright outdoor lighting.  Clear walkways of debris and clutter, including tools and rocks.  Regularly check that handrails are securely fastened and in good repair. Both sides of any steps should have handrails.  Install guardrails along the edges of any raised decks or porches.  Have leaves, snow, and ice cleared regularly.  Use sand or salt on walkways during winter months.  In the garage, clean up any spills right away, including grease or oil spills. What can I do in the bathroom?  Use night lights.  Install grab bars by the toilet and in the tub and shower. Do not use towel bars as grab bars.  Use non-skid mats or decals on the floor of the tub or shower.  If you need to sit down while you are in the shower, use a plastic, non-slip stool.  Keep the floor dry. Immediately clean up any water that spills on the floor.  Remove soap buildup in the tub or shower on a regular basis.  Attach bath mats securely with double-sided non-slip rug tape.  Remove throw rugs and other tripping hazards from the floor. What can I do in the bedroom?  Use night lights.  Make sure that a bedside light is easy to reach.  Do not use oversized bedding that drapes onto the floor.  Have a firm chair that has side arms to use for getting dressed.  Remove throw rugs and other tripping hazards from the floor. What can I do in the kitchen?  Clean up any spills right away.  Avoid walking on wet floors.  Place frequently used items in easy-to-reach places.  If you need to reach for something above  you, use a sturdy step stool that has a grab bar.  Keep electrical cables out of the way.  Do not use floor polish or wax that makes floors slippery. If you have to use wax, make sure that it is non-skid floor wax.  Remove throw rugs and other tripping hazards from the floor. What can I do in the stairways?  Do not leave any items on the stairs.  Make sure that there are handrails on both sides of the stairs. Fix handrails that are broken or loose. Make sure that handrails are as long as the stairways.  Check any carpeting to make sure that it is firmly attached to the stairs. Fix any carpet that is loose or worn.  Avoid having throw rugs at the top or bottom of stairways, or secure the rugs with carpet tape to prevent them from moving.  Make sure that you have a light switch at the top of the stairs and the bottom of the stairs. If you do not have them, have them installed. What are some other fall prevention tips?  Wear closed-toe shoes that fit well and support your feet. Wear shoes that have rubber soles or low heels.  When you use a stepladder, make sure that it is completely opened and that the sides are firmly locked. Have someone hold the ladder while you are using   it. Do not climb a closed stepladder.  Add color or contrast paint or tape to grab bars and handrails in your home. Place contrasting color strips on the first and last steps.  Use mobility aids as needed, such as canes, walkers, scooters, and crutches.  Turn on lights if it is dark. Replace any light bulbs that burn out.  Set up furniture so that there are clear paths. Keep the furniture in the same spot.  Fix any uneven floor surfaces.  Choose a carpet design that does not hide the edge of steps of a stairway.  Be aware of any and all pets.  Review your medicines with your healthcare provider. Some medicines can cause dizziness or changes in blood pressure, which increase your risk of falling. Talk with  your health care provider about other ways that you can decrease your risk of falls. This may include working with a physical therapist or trainer to improve your strength, balance, and endurance. This information is not intended to replace advice given to you by your health care provider. Make sure you discuss any questions you have with your health care provider. Document Released: 07/09/2002 Document Revised: 12/16/2015 Document Reviewed: 08/23/2014 Elsevier Interactive Patient Education  2017 [employer]  

## 2017-05-17 NOTE — Patient Instructions (Signed)
Tips for dressing at home:  Putting on shirt: 1. Kaitlin [name] will dress her left arm first - this is ok don't interrupt her. 2. If she tries to put it over her head before the right arm is fully in the sleeve, say "STOP" using a direct voice.  Then say "dress this arm" and point to her right arm.  Cue her to pull it up past her elbow.  3. Then she will put it over head. 4. She will likely need to adjust it - a mirror may be helpful with this.  Try using a question cue "Does it feel right?  Can you fix it"  Taking off shirt: 1. Start by cueing Kaitlin [name] to pull it over her head.  You may at first need to help her just a little.  Once she takes over let her. Try not to speak while she does it.   2.  She may need you to use a questioning cue if she hasn't taken it off her right arm.  "Is the shirt all the way off?"   Putting on pants: 1.  Kaitlin [name] will dress her left leg first 2.  Cue her to put the pants on the ground and step into the pants with her right leg. 3.  She was able to automatically stand and pull them up  Taking off pants - she did fine with this.  Tying shoes - let her try.  She may be inconsistent with this- able to do it sometimes and not at other times. Use the "STOP" method if she is having trouble.  Say "STOP" and give her at least 30 seconds then try again. I would let her try   Up to 3 or 4 times and then help at that point if she is having difficulty. You can try practicing tying shoes with the shoe on the table.

## 2017-05-18 ENCOUNTER — Ambulatory Visit: Payer: Medicare Other

## 2017-05-18 ENCOUNTER — Encounter (HOSPITAL_COMMUNITY): Payer: Self-pay | Admitting: Emergency Medicine

## 2017-05-18 ENCOUNTER — Ambulatory Visit (HOSPITAL_COMMUNITY)
Admission: EM | Admit: 2017-05-18 | Discharge: 2017-05-18 | Disposition: A | Discharge status: Home / Self Care | Payer: Medicare Other | Attending: Family Medicine | Admitting: Family Medicine

## 2017-05-18 ENCOUNTER — Ambulatory Visit: Payer: Medicare Other | Admitting: Occupational Therapy

## 2017-05-18 ENCOUNTER — Encounter: Payer: Self-pay | Admitting: Occupational Therapy

## 2017-05-18 DIAGNOSIS — R482 Apraxia: Secondary | ICD-10-CM

## 2017-05-18 DIAGNOSIS — R2231 Localized swelling, mass and lump, right upper limb: Secondary | ICD-10-CM

## 2017-05-18 DIAGNOSIS — M79601 Pain in right arm: Secondary | ICD-10-CM

## 2017-05-18 DIAGNOSIS — R2681 Unsteadiness on feet: Secondary | ICD-10-CM

## 2017-05-18 DIAGNOSIS — R41841 Cognitive communication deficit: Secondary | ICD-10-CM

## 2017-05-18 DIAGNOSIS — R471 Dysarthria and anarthria: Secondary | ICD-10-CM

## 2017-05-18 DIAGNOSIS — I69318 Other symptoms and signs involving cognitive functions following cerebral infarction: Secondary | ICD-10-CM

## 2017-05-18 DIAGNOSIS — R278 Other lack of coordination: Secondary | ICD-10-CM

## 2017-05-18 DIAGNOSIS — W19XXXA Unspecified fall, initial encounter: Secondary | ICD-10-CM

## 2017-05-18 DIAGNOSIS — I69353 Hemiplegia and hemiparesis following cerebral infarction affecting right non-dominant side: Secondary | ICD-10-CM

## 2017-05-18 DIAGNOSIS — M6281 Muscle weakness (generalized): Secondary | ICD-10-CM

## 2017-05-18 DIAGNOSIS — R4701 Aphasia: Secondary | ICD-10-CM

## 2017-05-18 DIAGNOSIS — M7989 Other specified soft tissue disorders: Secondary | ICD-10-CM

## 2017-05-18 NOTE — ED Provider Notes (Signed)
[facility]    CSN: [id number] Arrival date & time: 05/18/17  0957     History   Chief Complaint Chief Complaint  Patient presents with  . Arm Pain  . Fall    HPI Kaitlin [name] is a 71 y.o. female.   71 year old female with recent CVA in July on Plavix and Xarelto comes in with husband for right arm swelling after fall 3 days ago. Husband states patient used walker to get to the laundry room, was thought to have been tripped by the walker and fell forward. Unsure of head injury, husband states he was in the next room when event happened and was able reach her immediately, no loss of consciousness. Larey Seat caused a laceration on patient's right elbow on the extensor surface, which husband dressed and wrapped to stop bleeding. Patient had complained of tight bandaging later that day, causing him to redress the wound. He noticed the dressing soaked in blood the morning after, and redressed with tighter bandaging. Patient was at physical therapy and occupational therapy yesterday and had noticed mild swelling of the arm and hand. She was able to complete therapy without any step backs. Denies headache, nausea, vomiting, photophobia after accident. This morning, right hand/arm swelling had gotten worse and came in for evaluation. Patient states feels numbness/tingling of the hand with tightness. Denies fever, chills, night sweats. Denies spreading erythema, increased warmth.       Past Medical History:  Diagnosis Date  . Abnormality of gait as late effect of stroke 02/23/2017  . Acute hypoxemic respiratory failure (HCC)   . Atrial fibrillation with RVR (HCC) 02/17/2017  . CAD S/P percutaneous coronary angioplasty 2001  . CVA (cerebral vascular accident) (HCC) 01/2017  . Dysphagia as late effect of stroke 02/23/2017  . HCVD (hypertensive cardiovascular disease) 01/2017   normal LVF with moderate LVH, grade 1 DD  . High cholesterol   . Hypokalemia   . Labile blood pressure   .  Leukocytosis   . Multiple sclerosis (HCC)   . PAF (paroxysmal atrial fibrillation) (HCC) 01/2017  . Right hemiparesis [facility])     Patient Active Problem List   Diagnosis Date Noted  . Multiple sclerosis (HCC) 05/12/2017  . CAD (coronary artery disease) 04/12/2017  . Hypertension 04/11/2017  . Right hemiparesis (HCC)   . Aspiration pneumonia of right lung (HCC)   . Leukocytosis   . Hypokalemia   . Labile blood pressure   . Abnormality of gait as late effect of stroke 02/23/2017  . Dysarthria as late effect of stroke 02/23/2017  . Dysphagia as late effect of stroke 02/23/2017  . Acute ischemic left middle cerebral artery (MCA) stroke (HCC) 02/22/2017  . Paroxysmal A-fib (HCC) 02/17/2017  . Atrial fibrillation with RVR (HCC) 02/17/2017  . HLD (hyperlipidemia) 02/17/2017  . Acute hypoxemic respiratory failure (HCC)   . Stroke (cerebrum) (HCC) - Acute MCA infarct due to left CCA/ICA and MCA occlusion, s/p mechanical thrombectomy and left ICA stenting in the setting of newly diagnosed afib 02/14/2017    Past Surgical History:  Procedure Laterality Date  . CORONARY ANGIOPLASTY WITH STENT PLACEMENT  11/1999   RCA DES-[facility]  . IR ANGIO INTRA EXTRACRAN SEL COM CAROTID INNOMINATE UNI R MOD SED  02/15/2017  . IR ANGIO VERTEBRAL SEL SUBCLAVIAN INNOMINATE UNI R MOD SED  02/15/2017  . IR INTRAVSC STENT CERV CAROTID W/O EMB-PROT MOD SED INC ANGIO  02/15/2017  . IR PERCUTANEOUS ART THROMBECTOMY/INFUSION INTRACRANIAL INC DIAG ANGIO  02/15/2017  .  IR RADIOLOGIST EVAL & MGMT  05/10/2017  . NECK SURGERY    . RADIOLOGY WITH ANESTHESIA N/A 02/14/2017   Procedure: RADIOLOGY WITH ANESTHESIA;  Surgeon: Radiologist, Medication, MD;  Location: [facility] OR;  Service: Radiology;  Laterality: N/A;  . WRIST SURGERY      OB History    No data available       Home Medications    Prior to Admission medications   Medication Sig Start Date End Date Taking? Authorizing Provider  amiodarone (PACERONE) 200 MG tablet  Take 1 tablet (200 mg total) by mouth daily. 03/19/17   Love, Evlyn Kanner, PA-C  atorvastatin (LIPITOR) 40 MG tablet Take 1 tablet (40 mg total) by mouth daily at 6 PM. 03/18/17   Love, Evlyn Kanner, PA-C  Calcium Citrate-Vitamin D (CALCIUM CITRATE + D PO) Take 1 tablet by mouth daily.    [provider]  clopidogrel (PLAVIX) 75 MG tablet Take 1 tablet (75 mg total) by mouth daily. 03/19/17   Love, Evlyn Kanner, PA-C  FLUoxetine (PROZAC) 10 MG capsule Take 1 capsule (10 mg total) by mouth daily. 03/18/17   Love, Evlyn Kanner, PA-C  metoprolol tartrate (LOPRESSOR) 50 MG tablet Take 1 tablet (50 mg total) by mouth 2 (two) times daily. 03/18/17   Love, Evlyn Kanner, PA-C  Multiple Vitamins-Minerals (PRESERVISION AREDS PO) Take 1 tablet by mouth 2 (two) times daily.    [provider]  pantoprazole (PROTONIX) 40 MG tablet Take 1 tablet (40 mg total) by mouth daily. 03/19/17   Love, Evlyn Kanner, PA-C  Rivaroxaban (XARELTO) 15 MG TABS tablet Take 1 tablet (15 mg total) by mouth daily with supper. 03/18/17   Love, Evlyn Kanner, PA-C  venlafaxine XR (EFFEXOR-XR) 75 MG 24 hr capsule Take 1 capsule (75 mg total) by mouth daily. 03/18/17   Jacquelynn Cree, PA-C    Family History Family History  Problem Relation Age of Onset  . Heart disease Father   . Cancer Father   . Multiple sclerosis Sister   . Cancer Mother     Social History Social History  Substance Use Topics  . Smoking status: Never Smoker  . Smokeless tobacco: Never Used  . Alcohol use 1.8 oz/week    3 Glasses of wine per week     Comment: wine     Allergies   Eliquis [apixaban] and Penicillins   Review of Systems Review of Systems  Reason unable to perform ROS: See HPI as above.     Physical Exam Triage Vital Signs ED Triage Vitals  Enc Vitals Group     BP 05/18/17 1005 129/62     Pulse Rate 05/18/17 1005 (!) 56     Resp 05/18/17 1005 16     Temp 05/18/17 1005 98 F (36.7 C)     Temp Source 05/18/17 1005 Oral     SpO2 05/18/17 1005  100 %     Weight --      Height --      Head Circumference --      Peak Flow --      Pain Score 05/18/17 1009 0     Pain Loc --      Pain Edu? --      Excl. in GC? --    No data found.   Updated Vital Signs BP 129/62 (BP Location: Left Arm)   Pulse (!) 56   Temp 98 F (36.7 C) (Oral)   Resp 16   SpO2 100%   Physical  Exam  Constitutional: She is oriented to person, place, and time. She appears well-developed and well-nourished. No distress.  HENT:  Head: Normocephalic and atraumatic.  Eyes: Pupils are equal, round, and reactive to light. Conjunctivae and EOM are normal.  Neck: Normal range of motion. Neck supple.  Musculoskeletal:  Right bandage on right elbow. Diffuse swelling from right forearm to hand. Contusion on right flexor surface of forearm from prior fall. Contusion on dorsal hand from fall. No tenderness on palpation, increased warmth, spreading erythema.   After bandage removed. 1cm circular laceration with skin flap on extensor surface of right elbow. No active bleeding. Surrounding contusion without increased warmth.   Pitting edema on the hand and wrist. Full ROM of elbow, wrist, fingers. Sensation intact. Radial pulse unable to palpation on right arm due to edema. Cap refill <2s.   Neurological: She is alert and oriented to person, place, and time. She is not disoriented. No cranial nerve deficit.  Strength 4/5 right arm, 5/5 left arm, which patient states is baseline for her since stroke. Denies worsening of symptoms.   Skin: Skin is warm and dry.     UC Treatments / Results  Labs (all labs ordered are listed, but only abnormal results are displayed) Labs Reviewed - No data to display  EKG  EKG Interpretation None       Radiology No results found.  Procedures Procedures (including critical care time)  Medications Ordered in UC Medications - No data to display   Initial Impression / Assessment and Plan / UC Course  I have reviewed the  triage vital signs and the nursing notes.  Pertinent labs & imaging results that were available during my care of the patient were reviewed by me and considered in my medical decision making (see chart for details).    No signs of infection/phlebitis. Laceration without active bleeding. Swelling most likely due to tight bandaging. Patient with strength baseline after stroke with full ROM, low suspicion for fractures. No new neurology defects with normal session of occupational and physical therapy yesterday, low suspicion for intracranial bleed right now. Discussed wound dressing with patient and husband. Strict return precautions given.  Discussed case with Dr Tracie HarrierHagler, who agrees to plan.   Final Clinical Impressions(s) / UC Diagnoses   Final diagnoses:  Fall, initial encounter  Swelling of upper arm    New Prescriptions Discharge Medication List as of 05/18/2017 11:00 AM        Belinda FisherYu, [place] Crance V, PA-C 05/18/17 1621

## 2017-05-18 NOTE — ED Triage Notes (Signed)
Pt had stroke in July, on two types of blood thinners. Patient fell on Sunday, got tangled up with walker and fell forward, hit her head on wood floor. Pt denies LOC. No problems with head, patient was cut on her right elbow, her whole right arm is swollen. Pt c/o difficulty with stopping the bleeding. Pt states they stopped the bleeding but her arm is now swollen. Pt states they might have tied the dressing too tight.

## 2017-05-18 NOTE — Patient Instructions (Signed)
  Please complete the assigned speech therapy homework prior to your next session and return it to the speech therapist at your next visit.  

## 2017-05-18 NOTE — Discharge Instructions (Signed)
Right arm swelling most likely due to tight bandaging. No signs of infection or blood clots noted. Change dressing every 2-3 days. Monitor for any worsening symptoms, spreading redness, increased warmth, fever, follow up for reevaluation.

## 2017-05-18 NOTE — Therapy (Signed)
[facility] [address] [place], [place], [address] Phone: [phone]   Fax:  [phone]  Speech Language Pathology Treatment  Patient Details  Name: Kaitlin ScrapeLinda [name] MRN: [id number] Date of Birth: August 13, 1945 Referring Provider: Claudette LawsKirsteins, Andrew, MD  Encounter Date: 05/18/2017      End of Session - 05/18/17 1414    Visit Number 3   Number of Visits 17   Date for SLP Re-Evaluation 07/11/17   SLP Start Time 1319   SLP Stop Time  1401   SLP Time Calculation (min) 42 min   Activity Tolerance Patient tolerated treatment well      Past Medical History:  Diagnosis Date  . Abnormality of gait as late effect of stroke 02/23/2017  . Acute hypoxemic respiratory failure (HCC)   . Atrial fibrillation with RVR (HCC) 02/17/2017  . CAD S/P percutaneous coronary angioplasty 2001  . CVA (cerebral vascular accident) (HCC) 01/2017  . Dysphagia as late effect of stroke 02/23/2017  . HCVD (hypertensive cardiovascular disease) 01/2017   normal LVF with moderate LVH, grade 1 DD  . High cholesterol   . Hypokalemia   . Labile blood pressure   . Leukocytosis   . Multiple sclerosis (HCC)   . PAF (paroxysmal atrial fibrillation) (HCC) 01/2017  . Right hemiparesis [facility](HCC)     Past Surgical History:  Procedure Laterality Date  . CORONARY ANGIOPLASTY WITH STENT PLACEMENT  11/1999   RCA DES[facility]  . IR ANGIO INTRA EXTRACRAN SEL COM CAROTID INNOMINATE UNI R MOD SED  02/15/2017  . IR ANGIO VERTEBRAL SEL SUBCLAVIAN INNOMINATE UNI R MOD SED  02/15/2017  . IR INTRAVSC STENT CERV CAROTID W/O EMB-PROT MOD SED INC ANGIO  02/15/2017  . IR PERCUTANEOUS ART THROMBECTOMY/INFUSION INTRACRANIAL INC DIAG ANGIO  02/15/2017  . IR RADIOLOGIST EVAL & MGMT  05/10/2017  . NECK SURGERY    . RADIOLOGY WITH ANESTHESIA N/A 02/14/2017   Procedure: RADIOLOGY WITH ANESTHESIA;  Surgeon: Radiologist, Medication, MD;  Location: [facility];  Service: Radiology;  Laterality:  N/A;  . WRIST SURGERY      There were no vitals filed for this visit.      Subjective Assessment - 05/18/17 1323    Subjective Husband shared pt's difficulty with homework - auditory comprehension deficit.   Patient is accompained by: --  [name] husband   Currently in Pain? No/denies               ADULT SLP TREATMENT - 05/18/17 1326      General Information   Behavior/Cognition Alert;Cooperative;Pleasant mood     Treatment Provided   Treatment provided Cognitive-Linquistic     Cognitive-Linquistic Treatment   Treatment focused on Aphasia   Skilled Treatment SLP reviewed her homework with pt and husband, and educated husband for some different strategeies to assist pt with homework. Also SLP answered husband question about how to  cue pt if she has incorrect word choice (e.g, "gum" for "mint"). SLP used cards to elicit expressive language in complete sentences and thorough naming from pt, most responses req'd SLP cues for further more precise langauge - e.g., SLP cued pt with "She's running" as a response to "She's running on the beach." Pt benefitted from semantic and  phonemic cues used together more than just semantic cues, when rare anomia occurred.      Assessment / Recommendations / Plan   Plan Continue with current plan of care     Progression Toward Goals   Progression toward goals Progressing  toward goals          SLP Education - 05/18/17 1413    Education provided Yes   Education Details cueing techniques, assistance duirng homework   Person(s) Educated Patient;Spouse   Methods Explanation   Comprehension Verbalized understanding          SLP Short Term Goals - 05/18/17 1418      SLP SHORT TERM GOAL #1   Title pt will generate 12 items in a simple category with compensations allowed   Time 3   Period Weeks   Status On-going     SLP SHORT TERM GOAL #2   Title pt will provide sentence level responses using language compensations 90% success   Time  3   Period Weeks   Status On-going     SLP SHORT TERM GOAL #3   Title pt will demo understanding of 5 minutes mod complex conversation, by providing appropriate responses in conversation 95% over three sessions   Time 3   Period Weeks   Status On-going     SLP SHORT TERM GOAL #4   Title pt will demo appropriate breath support 95% for improvement in voice volume at word level   Time 3   Period Weeks   Status On-going          SLP Long Term Goals - 05/18/17 1418      SLP LONG TERM GOAL #1   Title pt will demo appropriate breath support for adequate speech volume in 10 minutes simple conversation    Time 7   Period Weeks   Status On-going     SLP LONG TERM GOAL #2   Title pt will demo understanding of 10 minutes mod-complex conversation with request for repeats PRN   Time 7   Period Weeks   Status On-going     SLP LONG TERM GOAL #3   Title Pt will complete mod complex/complex naming tasks with occasional min A   Time 7   Period Weeks   Status On-going     SLP LONG TERM GOAL #4   Title pt will produce 10 minutes simple-mod complex conversation using compensations for anomia over three sessions   Time 7   Period Weeks   Status On-going          Plan - 05/18/17 1414    Clinical Impression Statement Pt presents today with cont'd expressive aphasia, and some receptive aphasia. Pt continues to ask husband for assistance whether in conversation or with specific speech tasks. Pt's husband used binder for pt's homework this week. SLP educated and cued pt husband how to assist pt with anomia/dysnomia and with homework. Pt will cont to receive skilled ST to address aphasia, dysarthria (volume), and possibly memory should OT not address.    Speech Therapy Frequency 2x / week   Treatment/Interventions Language facilitation;Internal/external aids;Compensatory techniques;SLP instruction and feedback;Multimodal communcation approach;Cognitive reorganization;Functional tasks;Cueing  hierarchy;Compensatory strategies;Patient/family education   Potential to Achieve Goals Fair   Potential Considerations Co-morbidities;Severity of impairments;Ability to learn/carryover information;Previous level of function   Consulted and Agree with Plan of Care Patient;Family member/caregiver   Family Member Consulted husband      Patient will benefit from skilled therapeutic intervention in order to improve the following deficits and impairments:   Aphasia  Dysarthria and anarthria  Cognitive communication deficit    Problem List Patient Active Problem List   Diagnosis Date Noted  . Multiple sclerosis (HCC) 05/12/2017  . CAD (coronary artery disease) 04/12/2017  . Hypertension  04/11/2017  . Right hemiparesis (HCC)   . Aspiration pneumonia of right lung (HCC)   . Leukocytosis   . Hypokalemia   . Labile blood pressure   . Abnormality of gait as late effect of stroke 02/23/2017  . Dysarthria as late effect of stroke 02/23/2017  . Dysphagia as late effect of stroke 02/23/2017  . Acute ischemic left middle cerebral artery (MCA) stroke (HCC) 02/22/2017  . Paroxysmal A-fib (HCC) 02/17/2017  . Atrial fibrillation with RVR (HCC) 02/17/2017  . HLD (hyperlipidemia) 02/17/2017  . Acute hypoxemic respiratory failure (HCC)   . Stroke (cerebrum) (HCC) - Acute MCA infarct due to left CCA/ICA and MCA occlusion, s/p mechanical thrombectomy and left ICA stenting in the setting of newly diagnosed afib 02/14/2017    [facility] ,MS, CCC-SLP  05/18/2017, 2:20 PM  [facility] [address] [place], [place], [address] Phone: [phone]   Fax:  [phone]   Name: Kaitlin [name] MRN: [id number] Date of Birth: Jan 02, 1946

## 2017-05-18 NOTE — ED Notes (Signed)
Non stick dressing applied to wound. 

## 2017-05-18 NOTE — Therapy (Signed)
[facility] [address] [place], [place], [address] Phone: [phone]   Fax:  [phone]  Occupational Therapy Treatment  Patient Details  Name: Kaitlin [name] MRN: [id number] Date of Birth: 11-07-1945 Referring Provider: Dr. Wynn Banker  Encounter Date: 05/18/2017      OT End of Session - 05/18/17 1658    Visit Number 5   Number of Visits 17   Date for OT Re-Evaluation 07/04/17   Authorization Type UHC MCR - G code needed   Authorization - Visit Number 5   Authorization - Number of Visits 10   OT Start Time 1445   OT Stop Time 1528   OT Time Calculation (min) 43 min   Activity Tolerance Patient tolerated treatment well   Behavior During Therapy [facility] for tasks assessed/performed      Past Medical History:  Diagnosis Date  . Abnormality of gait as late effect of stroke 02/23/2017  . Acute hypoxemic respiratory failure (HCC)   . Atrial fibrillation with RVR (HCC) 02/17/2017  . CAD S/P percutaneous coronary angioplasty 2001  . CVA (cerebral vascular accident) (HCC) 01/2017  . Dysphagia as late effect of stroke 02/23/2017  . HCVD (hypertensive cardiovascular disease) 01/2017   normal LVF with moderate LVH, grade 1 DD  . High cholesterol   . Hypokalemia   . Labile blood pressure   . Leukocytosis   . Multiple sclerosis (HCC)   . PAF (paroxysmal atrial fibrillation) (HCC) 01/2017  . Right hemiparesis [facility])     Past Surgical History:  Procedure Laterality Date  . CORONARY ANGIOPLASTY WITH STENT PLACEMENT  11/1999   RCA DES-[facility]  . IR ANGIO INTRA EXTRACRAN SEL COM CAROTID INNOMINATE UNI R MOD SED  02/15/2017  . IR ANGIO VERTEBRAL SEL SUBCLAVIAN INNOMINATE UNI R MOD SED  02/15/2017  . IR INTRAVSC STENT CERV CAROTID W/O EMB-PROT MOD SED INC ANGIO  02/15/2017  . IR PERCUTANEOUS ART THROMBECTOMY/INFUSION INTRACRANIAL INC DIAG ANGIO  02/15/2017  . IR RADIOLOGIST EVAL & MGMT  05/10/2017  . NECK SURGERY    . RADIOLOGY  WITH ANESTHESIA N/A 02/14/2017   Procedure: RADIOLOGY WITH ANESTHESIA;  Surgeon: Radiologist, Medication, MD;  Location: [facility] OR;  Service: Radiology;  Laterality: N/A;  . WRIST SURGERY      There were no vitals filed for this visit.      Subjective Assessment - 05/18/17 1453    Subjective  Patient's husband reports that he had difficulty controlling bleeding after the fall   Patient is accompained by: Family member   Pertinent History CVA 02/14/17 PMH: MS for approx 20 years (but no exacerbation for 10 years), A-fib, CAD w/ stent in 2000, white matter dz   Patient Stated Goals to increase balance   Currently in Pain? No/denies   Pain Score 0-No pain                      OT Treatments/Exercises (OP) - 05/18/17 0001      ADLs   Eating Patient's husband indicates that patient has no difficulty feeding herself, although when asked about two handed tasks, e.g. cutting, husband indicated this was more challenging.  SImulated cutting meat on plate using bilateral technique, and patient needed frequent cues to incorporate right hand.  Patient with apraxi, and right inattention, but also hand quite edematous from fall, and therefore strength impaired.  Patient more limited in this activity by apraxia and inattention.     LB Dressing Husband indicated that patient was  better able to dress herself using the directions demonstrated last session.       Neurological Re-education Exercises   Other Exercises 1 Did rote repetition of familiar functional non dominant hand tasks.  With consistent cueing, patient able to begin to correct herself to use right hand versus left.,             Balance Exercises - 05/17/17 1142      Balance Exercises: Standing   Standing Eyes Closed Narrow base of support (BOS);Head turns;Foam/compliant surface;30 secs  significant LOB to lt with EC feet together; no righting rxn   Balance Beam black beam, feet apart EO goal of 30 seconds, 4 attempts;  educated when LOB occurs to take a big step and try to stop herself with one step only--pt with good understanding and return demonstrated fwd/bckwd multiple times)            OT Education - 05/18/17 1658    Education provided Yes   Education Details educated patient's husband in patient's presence - poor motor planning / apraxia   Person(s) Educated Patient;Spouse   Methods Explanation   Comprehension Need further instruction          OT Short Term Goals - 05/18/17 1701      OT SHORT TERM GOAL #1   Title Pt/family independent with coordination and putty HEP    Status On-going     OT SHORT TERM GOAL #2   Title Pt/family independent with HEP for RUE shoulder ROM   Status On-going     OT SHORT TERM GOAL #3   Title Pt/family to verbalize understanding with safety with shower transfers and bathing with DME/AE prn   Status Achieved     OT SHORT TERM GOAL #4   Title Pt/family to verbalize understanding with memory strategies for sequencing with donning shirt/pants, sequencing and safety with use of walker/rollator, and for medication management   Status On-going     OT SHORT TERM GOAL #5   Title Pt to improve grip strength by 10 lbs Rt hand    Status On-going     OT SHORT TERM GOAL #6   Title Pt to improve coordination Rt hand as evidenced by performing 9 hole peg test to 100 sec. or less   Status On-going           OT Long Term Goals - 05/17/17 1245      OT LONG TERM GOAL #1   Title Pt to retrieve light weight object from high shelf RUE requiring 120 degrees shoulder flexion w/o drops 5/5 trials   Time 8   Period Weeks   Status On-going     OT LONG TERM GOAL #2   Title Pt to perform simple snack prep and light IADLS (washing dishes, laundry tasks) demo safety with walker w/o LOB   Time 8   Period Weeks   Status On-going     OT LONG TERM GOAL #3   Title Pt to implement memory strategies into ADLS including sequencing of donning clothes, walker negotiation,  and medication management with supervision and no more than min cues   Time 8   Period Weeks   Status On-going     OT LONG TERM GOAL #4   Title Pt to improve coordination Rt hand as evidenced by performing 9 hole peg test in 80 sec. or less   Baseline 2 min. 6 sec   Time 8   Period Weeks   Status On-going  OT LONG TERM GOAL #5   Title Pt to tie shoes and style hair mod I level with A/E prn   Time 8   Period Weeks   Status On-going               Plan - 05/18/17 1659    Clinical Impression Statement Patient making steady improvement with her ability to incorporate her non dominant right hand into familiar functional activities.     Rehab Potential Fair   Current Impairments/barriers affecting progress: severity of deficits including aphasia, cognition   OT Frequency 2x / week   OT Duration 8 weeks   OT Treatment/Interventions Self-care/ADL training;DME and/or AE instruction;Patient/family education;Therapeutic exercises;Therapeutic activities;Functional Mobility Training;Passive range of motion;Cognitive remediation/compensation;Visual/perceptual remediation/compensation;Manual Therapy;Moist Heat   Plan balance through functional tasks, address apraxia via familiar functional tasks for non dominant hand, incorporate cognition   Consulted and Agree with Plan of Care Patient;Family member/caregiver   Family Member Consulted husband-[name]      Patient will benefit from skilled therapeutic intervention in order to improve the following deficits and impairments:  Decreased coordination, Decreased range of motion, Impaired flexibility, Impaired sensation, Decreased safety awareness, Decreased knowledge of precautions, Impaired tone, Impaired UE functional use, Decreased knowledge of use of DME, Decreased mobility, Decreased balance, Decreased cognition, Decreased strength, Impaired perceived functional ability, Impaired vision/preception  Visit Diagnosis: Hemiplegia and  hemiparesis following cerebral infarction affecting right non-dominant side (HCC)  Other lack of coordination  Apraxia  Other symptoms and signs involving cognitive functions following cerebral infarction  Unsteadiness on feet  Muscle weakness (generalized)    Problem List Patient Active Problem List   Diagnosis Date Noted  . Multiple sclerosis (HCC) 05/12/2017  . CAD (coronary artery disease) 04/12/2017  . Hypertension 04/11/2017  . Right hemiparesis (HCC)   . Aspiration pneumonia of right lung (HCC)   . Leukocytosis   . Hypokalemia   . Labile blood pressure   . Abnormality of gait as late effect of stroke 02/23/2017  . Dysarthria as late effect of stroke 02/23/2017  . Dysphagia as late effect of stroke 02/23/2017  . Acute ischemic left middle cerebral artery (MCA) stroke (HCC) 02/22/2017  . Paroxysmal A-fib (HCC) 02/17/2017  . Atrial fibrillation with RVR (HCC) 02/17/2017  . HLD (hyperlipidemia) 02/17/2017  . Acute hypoxemic respiratory failure (HCC)   . Stroke (cerebrum) (HCC) - Acute MCA infarct due to left CCA/ICA and MCA occlusion, s/p mechanical thrombectomy and left ICA stenting in the setting of newly diagnosed afib 02/14/2017    Collier Salina, OTR/L 05/18/2017, 5:03 PM  [facility] [address] [place], [place], [address] Phone: [phone]   Fax:  [phone]  Name: Kaitlin [name] MRN: [id number] Date of Birth: June 08, 1946

## 2017-05-25 ENCOUNTER — Ambulatory Visit: Payer: Medicare Other | Admitting: Physical Therapy

## 2017-05-25 ENCOUNTER — Ambulatory Visit: Payer: Medicare Other | Admitting: Occupational Therapy

## 2017-05-25 ENCOUNTER — Encounter: Payer: Self-pay | Admitting: Physical Therapy

## 2017-05-25 ENCOUNTER — Ambulatory Visit: Payer: Medicare Other

## 2017-05-25 DIAGNOSIS — I69353 Hemiplegia and hemiparesis following cerebral infarction affecting right non-dominant side: Secondary | ICD-10-CM

## 2017-05-25 DIAGNOSIS — R471 Dysarthria and anarthria: Secondary | ICD-10-CM

## 2017-05-25 DIAGNOSIS — R41841 Cognitive communication deficit: Secondary | ICD-10-CM

## 2017-05-25 DIAGNOSIS — R278 Other lack of coordination: Secondary | ICD-10-CM

## 2017-05-25 DIAGNOSIS — I69318 Other symptoms and signs involving cognitive functions following cerebral infarction: Secondary | ICD-10-CM

## 2017-05-25 DIAGNOSIS — R4701 Aphasia: Secondary | ICD-10-CM

## 2017-05-25 DIAGNOSIS — R2681 Unsteadiness on feet: Secondary | ICD-10-CM

## 2017-05-25 DIAGNOSIS — R2689 Other abnormalities of gait and mobility: Secondary | ICD-10-CM

## 2017-05-25 DIAGNOSIS — R482 Apraxia: Secondary | ICD-10-CM

## 2017-05-25 NOTE — Therapy (Signed)
[facility] [address] [place], [place], [address] Phone: [phone]   Fax:  [phone]  Physical Therapy Treatment  Patient Details  Name: Kaitlin [name] MRN: [id number] Date of Birth: [birth year] Referring Provider: Claudette Laws MD  Encounter Date: 05/25/2017      PT End of Session - 05/25/17 1052    Visit Number 4   Number of Visits 17   Date for PT Re-Evaluation 07/04/17   Authorization Type UHC MCR   Authorization Time Period 05/05/17  to  07/04/17   PT Start Time 0930   PT Stop Time 1016   PT Time Calculation (min) 46 min   Equipment Utilized During Treatment Gait belt   Activity Tolerance Patient tolerated treatment well   Behavior During Therapy [facility] for tasks assessed/performed      Past Medical History:  Diagnosis Date  . Abnormality of gait as late effect of stroke 02/23/2017  . Acute hypoxemic respiratory failure (HCC)   . Atrial fibrillation with RVR (HCC) 02/17/2017  . CAD S/P percutaneous coronary angioplasty 2001  . CVA (cerebral vascular accident) (HCC) 01/2017  . Dysphagia as late effect of stroke 02/23/2017  . HCVD (hypertensive cardiovascular disease) 01/2017   normal LVF with moderate LVH, grade 1 DD  . High cholesterol   . Hypokalemia   . Labile blood pressure   . Leukocytosis   . Multiple sclerosis (HCC)   . PAF (paroxysmal atrial fibrillation) (HCC) 01/2017  . Right hemiparesis [facility])     Past Surgical History:  Procedure Laterality Date  . CORONARY ANGIOPLASTY WITH STENT PLACEMENT  11/1999   RCA DES[facility]  . IR ANGIO INTRA EXTRACRAN SEL COM CAROTID INNOMINATE UNI R MOD SED  02/15/2017  . IR ANGIO VERTEBRAL SEL SUBCLAVIAN INNOMINATE UNI R MOD SED  02/15/2017  . IR INTRAVSC STENT CERV CAROTID W/O EMB-PROT MOD SED INC ANGIO  02/15/2017  . IR PERCUTANEOUS ART THROMBECTOMY/INFUSION INTRACRANIAL INC DIAG ANGIO  02/15/2017  . IR RADIOLOGIST EVAL & MGMT  05/10/2017  . NECK SURGERY     . RADIOLOGY WITH ANESTHESIA N/A 02/14/2017   Procedure: RADIOLOGY WITH ANESTHESIA;  Surgeon: Radiologist, Medication, MD;  Location: [facility] OR;  Service: Radiology;  Laterality: N/A;  . WRIST SURGERY      There were no vitals filed for this visit.      Subjective Assessment - 05/25/17 1019    Subjective No new complaints, no falls or pain to report.    Patient is accompained by: Family member   Patient Stated Goals "Standing up straight; getting my self-control" (balance)   Currently in Pain? No/denies                         [facility] Adult PT Treatment/Exercise - 05/25/17 0001      Ambulation/Gait   Ambulation/Gait Yes   Ambulation/Gait Assistance 4: Min guard   Ambulation/Gait Assistance Details No AD, ambulated down hall including head turns/head nods/diagonals/fast walking then stopping or slowing down, pt showed minimal loss of balance with turns at end of hall and slight dirft with diagonals    Ambulation Distance (Feet) 500 Feet   Assistive device None   Gait Pattern Step-through pattern;Decreased stride length;Decreased step length - right;Decreased step length - left;Poor foot clearance - left;Poor foot clearance - right   Ambulation Surface Level   Stairs Yes   Stairs Assistance 4: Min assist   Stairs Assistance Details (indicate cue type and reason) supervision going up  stairs, min assist going down, Pt cathing heel on steps going down   Stair Management Technique Two rails;Alternating pattern;Forwards   Number of Stairs 4  ascend/descend x4   Height of Stairs 6   Ramp 5: Supervision   Ramp Details (indicate cue type and reason) Pt ascended/descended ramp, no AD, with no loss of balance but with decreased speed    Curb 4: Min assist   Curb Details (indicate cue type and reason) Pt ascended/descended curb x4, no AD, with supervision going up/min guard going down. Pt continued to lose balance while stepping off of curb      High Level Balance   High Level  Balance Activities Marching forwards;Marching backwards   High Level Balance Comments beside counter, no UE support required, x5 reps, cues to pick up knees higher      Neuro Re-ed    Neuro Re-ed Details  Pt standing on blue airex in corner with chair in front for support if needed. Pt performed head nods/head turns/diagonals with EO/EC. Pt required no UE support but showed moderate imbalance with EC activities.      Exercises   Exercises Knee/Hip     Knee/Hip Exercises: Standing   Heel Raises Limitations;5 reps   Heel Raises Limitations Toe walking fwds/bkwds at counter, no UE support required, cues to remember to stay on toes.    Hip Abduction AROM;Stengthening;Both;10 reps;Knee straight;Limitations   Abduction Limitations Pt only using single UE on counter, pt showed no loss of balance with this task, cues to slow down and bring leg up further   Hip Extension AROM;Both;Stengthening;10 reps;Knee straight;Limitations   Extension Limitations One UE on counter, no loss of balance, cues to bring foot off floor   SLS One UE on counter, 10 sec hold, pt performed task well with no limitations    Other Standing Knee Exercises Heel walking at counter but no UE support required, x5 fwd/bkwds, some imbalance while walking backwards, cues to slow down    Other Standing Knee Exercises Sit to stands from chair with arms crossed x5reps, pt performed this task well with no loss of balance or use of arms             PT Education - 05/25/17 1051    Education provided Yes   Education Details Educated on bringing the toes of the edge of the curb/stair with opposite foot before stepping down for better heel clearance    Person(s) Educated Patient   Methods Explanation;Demonstration   Comprehension Verbalized understanding;Returned demonstration          PT Short Term Goals - 05/25/17 1052      PT SHORT TERM GOAL #1   Title Patient (with supervision to min-guard assistance of her spouse) will  perform basic HEP for balance and strengthening. (Target STGs 06/04/17)   Time 4   Period Weeks   Status New     PT SHORT TERM GOAL #2   Title Patient will complete FGA with STG and LTG set as appropriate.   Baseline 10/10 FGA 9/30; 10/16 goal set   Time 1   Period Weeks   Status Achieved     PT SHORT TERM GOAL #3   Title Patient will improve TUG with or without a device to <15 seconds to demonstrate a lesser fall risk.    Baseline rollator 18.25; no device 17.81 sec   Time 4   Period Weeks   Status New     PT SHORT TERM GOAL #4  Title Patient will improve her gait velocity to 2.62 ft/sec to indicate lesser fall risk with community ambulation.    Baseline 2.25 ft/sec   Time 4   Period Weeks   Status New     PT SHORT TERM GOAL #5   Title Patient will improve FGA score to >=14/30 to demonstrate progress towards lesser fall risk   Time 3   Period Weeks   Status New           PT Long Term Goals - 05/25/17 1052      PT LONG TERM GOAL #1   Title Patient and/or husband will be able to verbalize signs and symptoms of CVA and appropriate action to take. (Target LTGs: 07/04/17)   Time 8   Period Weeks   Status New     PT LONG TERM GOAL #2   Title Patient (with supervision of husband) will perform updated HEP   Time 8   Period Weeks   Status New     PT LONG TERM GOAL #3   Title Patient will ambulate >=2.79 ft/sec (age based norm) with LRAD or no device on level surface.   Time 8   Period Weeks   Status New     PT LONG TERM GOAL #4   Title Patient will ambulate >= 1000 ft on outdoor surfaces (level and unlevel, grass, gravel, mulch) with LRAD and supervision.    Time 8   Period Weeks   Status New     PT LONG TERM GOAL #5   Title Patient will perform TUG <13.5 seconds to demonstrate lesser fall risk.   Time 8   Period Weeks   Status New     PT LONG TERM GOAL #6   Title Patient will improve FGA score by 4 points above her score assessed at STGs (TBA).    Baseline 10/16 goal set per STG set on eval   Time 7   Period Weeks   Status New            Plan - 05/25/17 1053    Clinical Impression Statement Pt tolerated treatment well with no limitations to pain or fatigue. Todays session focused on dynamic gait and standing activities for balance, and LE strengthening without AD and minimal UE use. Pt did not use RW during session and carried it over to mat from chair at desk after OT session. Pt had minimal loss of balance with todays activities. Pt ok to use no AD during therapy but recommended to continue to use AD otherwise. Pt able to decrease UE support with standing exercises.     Rehab Potential Good   Clinical Impairments Affecting Rehab Potential impulsivity and decr awareness of deficits   PT Frequency 2x / week   PT Duration 8 weeks   PT Treatment/Interventions ADLs/Self Care Home Management;DME Instruction;Gait training;Stair training;Functional mobility training;Orthotic Fit/Training;Patient/family education;Cognitive remediation;Neuromuscular re-education;Balance training;Therapeutic exercise;Therapeutic activities   PT Next Visit Plan continue to work on standing balance in reaching, wt-shifting tasks with single UE support, and LE strengthening    Consulted and Agree with Plan of Care Patient;Family member/caregiver   Family Member Consulted spouse, [name]      Patient will benefit from skilled therapeutic intervention in order to improve the following deficits and impairments:  Abnormal gait, Decreased balance, Decreased cognition, Decreased knowledge of use of DME, Decreased mobility, Decreased safety awareness, Decreased strength, Difficulty walking, Impaired perceived functional ability, Impaired sensation, Impaired UE functional use  Visit Diagnosis: Other abnormalities of  gait and mobility  Unsteadiness on feet     Problem List Patient Active Problem List   Diagnosis Date Noted  . Multiple sclerosis (HCC) 05/12/2017   . CAD (coronary artery disease) 04/12/2017  . Hypertension 04/11/2017  . Right hemiparesis (HCC)   . Aspiration pneumonia of right lung (HCC)   . Leukocytosis   . Hypokalemia   . Labile blood pressure   . Abnormality of gait as late effect of stroke 02/23/2017  . Dysarthria as late effect of stroke 02/23/2017  . Dysphagia as late effect of stroke 02/23/2017  . Acute ischemic left middle cerebral artery (MCA) stroke (HCC) 02/22/2017  . Paroxysmal A-fib (HCC) 02/17/2017  . Atrial fibrillation with RVR (HCC) 02/17/2017  . HLD (hyperlipidemia) 02/17/2017  . Acute hypoxemic respiratory failure (HCC)   . Stroke (cerebrum) (HCC) - Acute MCA infarct due to left CCA/ICA and MCA occlusion, s/p mechanical thrombectomy and left ICA stenting in the setting of newly diagnosed afib 02/14/2017   Kailan Laws, SPTA  Bellamy Judson 05/25/2017, 3:06 PM  [facility] [address] [place], [place], [address] Phone: [phone]   Fax:  [phone]  Name: Kaitlin [name] MRN: [id number] Date of Birth: [birth year]

## 2017-05-25 NOTE — Therapy (Signed)
[facility] [address] [place], [place], [address] Phone: [phone]   Fax:  [phone]  Occupational Therapy Treatment  Patient Details  Name: Kaitlin [name] MRN: [id number] Date of Birth: 1945/09/11 Referring Provider: Dr. Wynn Banker  Encounter Date: 05/25/2017      OT End of Session - 05/25/17 1503    Visit Number 6   Number of Visits 17   Date for OT Re-Evaluation 07/04/17   Authorization Type [employer] MCR - G code needed   Authorization - Visit Number 6   Authorization - Number of Visits 10   OT Start Time 0845   OT Stop Time 0930   OT Time Calculation (min) 45 min   Activity Tolerance Patient tolerated treatment well      Past Medical History:  Diagnosis Date  . Abnormality of gait as late effect of stroke 02/23/2017  . Acute hypoxemic respiratory failure (HCC)   . Atrial fibrillation with RVR (HCC) 02/17/2017  . CAD S/P percutaneous coronary angioplasty 2001  . CVA (cerebral vascular accident) (HCC) 01/2017  . Dysphagia as late effect of stroke 02/23/2017  . HCVD (hypertensive cardiovascular disease) 01/2017   normal LVF with moderate LVH, grade 1 DD  . High cholesterol   . Hypokalemia   . Labile blood pressure   . Leukocytosis   . Multiple sclerosis (HCC)   . PAF (paroxysmal atrial fibrillation) (HCC) 01/2017  . Right hemiparesis [facility])     Past Surgical History:  Procedure Laterality Date  . CORONARY ANGIOPLASTY WITH STENT PLACEMENT  11/1999   RCA DES-[facility]  . IR ANGIO INTRA EXTRACRAN SEL COM CAROTID INNOMINATE UNI R MOD SED  02/15/2017  . IR ANGIO VERTEBRAL SEL SUBCLAVIAN INNOMINATE UNI R MOD SED  02/15/2017  . IR INTRAVSC STENT CERV CAROTID W/O EMB-PROT MOD SED INC ANGIO  02/15/2017  . IR PERCUTANEOUS ART THROMBECTOMY/INFUSION INTRACRANIAL INC DIAG ANGIO  02/15/2017  . IR RADIOLOGIST EVAL & MGMT  05/10/2017  . NECK SURGERY    . RADIOLOGY WITH ANESTHESIA N/A 02/14/2017   Procedure: RADIOLOGY WITH  ANESTHESIA;  Surgeon: Radiologist, Medication, MD;  Location: [facility] OR;  Service: Radiology;  Laterality: N/A;  . WRIST SURGERY      There were no vitals filed for this visit.      Subjective Assessment - 05/25/17 0856    Subjective  Husband reports that she has been walking in the house w/o the walker for the past week. Husband feels the walker is actually a tripping hazard and that the recent fall involved the walker. (O.T. relayed this info to P.T.)    Patient is accompained by: Family member  husband   Pertinent History CVA 02/14/17 PMH: MS for approx 20 years (but no exacerbation for 10 years), A-fib, CAD w/ stent in 2000, white matter dz   Patient Stated Goals to increase balance   Currently in Pain? No/denies                      OT Treatments/Exercises (OP) - 05/25/17 0001      ADLs   Functional Mobility Pt practiced retrieving things out of lower cabinets with one hand countertop support and min cues to remember to use one hand on countertop for balance/safety.    Home Maintenance Practiced carrying laundry basket to table for balance and bilateral integration with CGA and use of gait belt. Pt had LOB backwards and therapist had to assist minimally to regain balance. Pt then folded clothes,  pillow cases for forced use of RUE and bilateral integration   ADL Comments Reviewed safety with transfers from walking to sitting position in chair - pt required max v.c's to sit safely even with repetition. Pt with decreased awareness, attention to Rt side, and difficulty remembering proper sequencing     Fine Motor Coordination   Other Fine Motor Exercises Stringing large wooden beads for bilateral integration and use of RUE. Pt asked to follow sequence/pattern with shapes - pt had difficulty initially but then able to do w/o cues     Functional Reaching Activities   Mid Level Retrieving and replacing cones from mid level shelf with RUE for balance and functional use Rt hand. Pt  required mod to max cues to use RUE as pt attempts to use LUE. Pt also asked to call out colors as she retrieves for cognitive component. Pt then practice reaching outside BOS bilaterally for dynamic standing balance (therapist had gait belt with CGA) with no LOB to grasp cones RUE, LUE                  OT Short Term Goals - 05/18/17 1701      OT SHORT TERM GOAL #1   Title Pt/family independent with coordination and putty HEP    Status On-going     OT SHORT TERM GOAL #2   Title Pt/family independent with HEP for RUE shoulder ROM   Status On-going     OT SHORT TERM GOAL #3   Title Pt/family to verbalize understanding with safety with shower transfers and bathing with DME/AE prn   Status Achieved     OT SHORT TERM GOAL #4   Title Pt/family to verbalize understanding with memory strategies for sequencing with donning shirt/pants, sequencing and safety with use of walker/rollator, and for medication management   Status On-going     OT SHORT TERM GOAL #5   Title Pt to improve grip strength by 10 lbs Rt hand    Status On-going     OT SHORT TERM GOAL #6   Title Pt to improve coordination Rt hand as evidenced by performing 9 hole peg test to 100 sec. or less   Status On-going           OT Long Term Goals - 05/17/17 1245      OT LONG TERM GOAL #1   Title Pt to retrieve light weight object from high shelf RUE requiring 120 degrees shoulder flexion w/o drops 5/5 trials   Time 8   Period Weeks   Status On-going     OT LONG TERM GOAL #2   Title Pt to perform simple snack prep and light IADLS (washing dishes, laundry tasks) demo safety with walker w/o LOB   Time 8   Period Weeks   Status On-going     OT LONG TERM GOAL #3   Title Pt to implement memory strategies into ADLS including sequencing of donning clothes, walker negotiation, and medication management with supervision and no more than min cues   Time 8   Period Weeks   Status On-going     OT LONG TERM GOAL #4    Title Pt to improve coordination Rt hand as evidenced by performing 9 hole peg test in 80 sec. or less   Baseline 2 min. 6 sec   Time 8   Period Weeks   Status On-going     OT LONG TERM GOAL #5   Title Pt to tie shoes and style  hair mod I level with A/E prn   Time 8   Period Weeks   Status On-going               Plan - 05/25/17 1503    Clinical Impression Statement Pt making steady improvement with her ability to incorporate her non dominant right hand into familiar fx'al activities. Pt limited by learned non-use, apraxia, and cognitive deficits.    Rehab Potential Fair   Current Impairments/barriers affecting progress: severity of deficits including aphasia, cognition   OT Frequency 2x / week   OT Duration 8 weeks   OT Treatment/Interventions Self-care/ADL training;DME and/or AE instruction;Patient/family education;Therapeutic exercises;Therapeutic activities;Functional Mobility Training;Passive range of motion;Cognitive remediation/compensation;Visual/perceptual remediation/compensation;Manual Therapy;Moist Heat   Plan begin assessing STG's, balance through functional tasks, continue use of Rt hand in functional tasks and incorporate cognition as able   Consulted and Agree with Plan of Care Patient;Family member/caregiver   Family Member Consulted husband-[name]      Patient will benefit from skilled therapeutic intervention in order to improve the following deficits and impairments:  Decreased coordination, Decreased range of motion, Impaired flexibility, Impaired sensation, Decreased safety awareness, Decreased knowledge of precautions, Impaired tone, Impaired UE functional use, Decreased knowledge of use of DME, Decreased mobility, Decreased balance, Decreased cognition, Decreased strength, Impaired perceived functional ability, Impaired vision/preception  Visit Diagnosis: Hemiplegia and hemiparesis following cerebral infarction affecting right non-dominant side  (HCC)  Apraxia  Other lack of coordination  Unsteadiness on feet  Other symptoms and signs involving cognitive functions following cerebral infarction    Problem List Patient Active Problem List   Diagnosis Date Noted  . Multiple sclerosis (HCC) 05/12/2017  . CAD (coronary artery disease) 04/12/2017  . Hypertension 04/11/2017  . Right hemiparesis (HCC)   . Aspiration pneumonia of right lung (HCC)   . Leukocytosis   . Hypokalemia   . Labile blood pressure   . Abnormality of gait as late effect of stroke 02/23/2017  . Dysarthria as late effect of stroke 02/23/2017  . Dysphagia as late effect of stroke 02/23/2017  . Acute ischemic left middle cerebral artery (MCA) stroke (HCC) 02/22/2017  . Paroxysmal A-fib (HCC) 02/17/2017  . Atrial fibrillation with RVR (HCC) 02/17/2017  . HLD (hyperlipidemia) 02/17/2017  . Acute hypoxemic respiratory failure (HCC)   . Stroke (cerebrum) (HCC) - Acute MCA infarct due to left CCA/ICA and MCA occlusion, s/p mechanical thrombectomy and left ICA stenting in the setting of newly diagnosed afib 02/14/2017    Kelli ChurnBallie, Jhair Witherington Johnson, OTR/L 05/25/2017, 3:07 PM  [facility] [address] [place], [place], [address] Phone: [phone]   Fax:  [phone]  Name: Bennett ScrapeLinda [name] MRN: [id number] Date of Birth: 1945/12/10

## 2017-05-25 NOTE — Therapy (Signed)
[facility] [address] [place], [place], [address] Phone: [phone]   Fax:  [phone]  Speech Language Pathology Treatment  Patient Details  Name: Kaitlin [name] MRN: [id number] Date of Birth: 02/27/1946 Referring Provider: Claudette Laws, MD  Encounter Date: 05/25/2017      End of Session - 05/25/17 1406    Visit Number 4   Number of Visits 17   Date for SLP Re-Evaluation 07/11/17   SLP Start Time 0803   SLP Stop Time  0845   SLP Time Calculation (min) 42 min      Past Medical History:  Diagnosis Date  . Abnormality of gait as late effect of stroke 02/23/2017  . Acute hypoxemic respiratory failure (HCC)   . Atrial fibrillation with RVR (HCC) 02/17/2017  . CAD S/P percutaneous coronary angioplasty 2001  . CVA (cerebral vascular accident) (HCC) 01/2017  . Dysphagia as late effect of stroke 02/23/2017  . HCVD (hypertensive cardiovascular disease) 01/2017   normal LVF with moderate LVH, grade 1 DD  . High cholesterol   . Hypokalemia   . Labile blood pressure   . Leukocytosis   . Multiple sclerosis (HCC)   . PAF (paroxysmal atrial fibrillation) (HCC) 01/2017  . Right hemiparesis [facility])     Past Surgical History:  Procedure Laterality Date  . CORONARY ANGIOPLASTY WITH STENT PLACEMENT  11/1999   RCA DES[facility]  . IR ANGIO INTRA EXTRACRAN SEL COM CAROTID INNOMINATE UNI R MOD SED  02/15/2017  . IR ANGIO VERTEBRAL SEL SUBCLAVIAN INNOMINATE UNI R MOD SED  02/15/2017  . IR INTRAVSC STENT CERV CAROTID W/O EMB-PROT MOD SED INC ANGIO  02/15/2017  . IR PERCUTANEOUS ART THROMBECTOMY/INFUSION INTRACRANIAL INC DIAG ANGIO  02/15/2017  . IR RADIOLOGIST EVAL & MGMT  05/10/2017  . NECK SURGERY    . RADIOLOGY WITH ANESTHESIA N/A 02/14/2017   Procedure: RADIOLOGY WITH ANESTHESIA;  Surgeon: Radiologist, Medication, MD;  Location: [facility] OR;  Service: Radiology;  Laterality: N/A;  . WRIST SURGERY      There were no vitals filed  for this visit.      Subjective Assessment - 05/25/17 0808    Subjective Pt laughed appropriately in conversation.    Patient is accompained by: Family member  husband   Currently in Pain? No/denies               ADULT SLP TREATMENT - 05/25/17 0809      General Information   Behavior/Cognition Alert;Cooperative;Pleasant mood;Requires cueing  requires cueing rarely/occasionally     Treatment Provided   Treatment provided Cognitive-Linquistic     Cognitive-Linquistic Treatment   Treatment focused on Aphasia;Patient/family/caregiver education   Skilled Treatment SLP targeted pt's naming by working with divergent naming tasks "(simple). Pt with average 4.5 items prior to requiring cues from SLP. Mod-max cues consistently necessary. At times pt would say a correct item and not realize she did. PT default to look to husband for assistance. A small fraction of the time (<15%?) SLP believes pt could not think of another item for a simple category.     Assessment / Recommendations / Plan   Plan --  STGs downgraded     Progression Toward Goals   Progression toward goals Progressing toward goals            SLP Short Term Goals - 05/25/17 1411      SLP SHORT TERM GOAL #1   Title pt will generate 8 items in a simple category with compensations  allowed   Time 2   Period Weeks   Status Revised     SLP SHORT TERM GOAL #2   Title pt will provide sentence level responses using language compensations 80% success   Time 2   Period Weeks   Status Revised     SLP SHORT TERM GOAL #3   Title pt will demo understanding of 5 minutes mod complex conversation, by providing appropriate responses in conversation 95% over three sessions   Time 2   Period Weeks   Status On-going     SLP SHORT TERM GOAL #4   Title pt will demo appropriate breath support 95% for improvement in voice volume at word level   Time 2   Period Weeks   Status On-going          SLP Long Term Goals -  05/25/17 1412      SLP LONG TERM GOAL #1   Title pt will demo appropriate breath support for adequate speech volume in 10 minutes simple conversation    Time 6   Period Weeks   Status On-going     SLP LONG TERM GOAL #2   Title pt will demo understanding of 10 minutes mod-complex conversation with request for repeats PRN   Time 6   Period Weeks   Status On-going     SLP LONG TERM GOAL #3   Title Pt will complete mod complex/complex naming tasks with occasional min A   Time 6   Period Weeks   Status On-going     SLP LONG TERM GOAL #4   Title pt will produce 10 minutes simple-mod complex conversation using compensations for anomia over three sessions   Time 6   Period Weeks   Status On-going          Plan - 05/25/17 1406    Clinical Impression Statement Pt presents today with cont'd (at least) mod-severe expressive aphasia, with some degree of receptive aphasia. Pt continues to ask husband for assistance whether in conversation or with specific speech tasks. Lajoyce Corners continues to be used for homework. Pt will cont to receive skilled ST to address aphasia, dysarthria (volume), and possibly memory should OT not address.    Speech Therapy Frequency 2x / week   Treatment/Interventions Language facilitation;Internal/external aids;Compensatory techniques;SLP instruction and feedback;Multimodal communcation approach;Cognitive reorganization;Functional tasks;Cueing hierarchy;Compensatory strategies;Patient/family education   Potential to Achieve Goals Fair   Potential Considerations Co-morbidities;Severity of impairments;Ability to learn/carryover information;Previous level of function   Consulted and Agree with Plan of Care Patient;Family member/caregiver   Family Member Consulted husband      Patient will benefit from skilled therapeutic intervention in order to improve the following deficits and impairments:   Aphasia  Dysarthria and anarthria  Cognitive communication  deficit    Problem List Patient Active Problem List   Diagnosis Date Noted  . Multiple sclerosis (HCC) 05/12/2017  . CAD (coronary artery disease) 04/12/2017  . Hypertension 04/11/2017  . Right hemiparesis (HCC)   . Aspiration pneumonia of right lung (HCC)   . Leukocytosis   . Hypokalemia   . Labile blood pressure   . Abnormality of gait as late effect of stroke 02/23/2017  . Dysarthria as late effect of stroke 02/23/2017  . Dysphagia as late effect of stroke 02/23/2017  . Acute ischemic left middle cerebral artery (MCA) stroke (HCC) 02/22/2017  . Paroxysmal A-fib (HCC) 02/17/2017  . Atrial fibrillation with RVR (HCC) 02/17/2017  . HLD (hyperlipidemia) 02/17/2017  . Acute hypoxemic respiratory failure (HCC)   .  Stroke (cerebrum) (HCC) - Acute MCA infarct due to left CCA/ICA and MCA occlusion, s/p mechanical thrombectomy and left ICA stenting in the setting of newly diagnosed afib 02/14/2017    [facility]IN[facility],Sherron Mapp ,MS, CCC-SLP  05/25/2017, 2:13 PM  [facility] [address] [place], [place], [address] Phone: [phone]   Fax:  [phone]   Name: Bennett ScrapeLinda Labo MRN: [id number] Date of Birth: January 13, 1946

## 2017-05-27 ENCOUNTER — Encounter: Payer: Medicare Other | Admitting: Occupational Therapy

## 2017-05-27 ENCOUNTER — Encounter: Payer: Self-pay | Admitting: Physical Therapy

## 2017-05-27 ENCOUNTER — Ambulatory Visit: Payer: Medicare Other | Admitting: Physical Therapy

## 2017-05-27 ENCOUNTER — Ambulatory Visit: Payer: Medicare Other

## 2017-05-27 DIAGNOSIS — R2681 Unsteadiness on feet: Secondary | ICD-10-CM

## 2017-05-27 DIAGNOSIS — M6281 Muscle weakness (generalized): Secondary | ICD-10-CM

## 2017-05-27 DIAGNOSIS — R471 Dysarthria and anarthria: Secondary | ICD-10-CM

## 2017-05-27 DIAGNOSIS — R4701 Aphasia: Secondary | ICD-10-CM | POA: Diagnosis not present

## 2017-05-27 DIAGNOSIS — R2689 Other abnormalities of gait and mobility: Secondary | ICD-10-CM

## 2017-05-27 DIAGNOSIS — R41841 Cognitive communication deficit: Secondary | ICD-10-CM

## 2017-05-27 NOTE — Therapy (Signed)
[facility] [address] [place], [place], [address] Phone: [phone]   Fax:  [phone]  Physical Therapy Treatment  Patient Details  Name: Kaitlin [name] MRN: [id number] Date of Birth: Jun 07, 1946 Referring Provider: Claudette Laws MD  Encounter Date: 05/27/2017      PT End of Session - 05/27/17 1655    Visit Number 5   Number of Visits 17   Date for PT Re-Evaluation 07/04/17   Authorization Type [employer] MCR   Authorization Time Period 05/05/17  to  07/04/17   PT Start Time 1016   PT Stop Time 1100   PT Time Calculation (min) 44 min   Equipment Utilized During Treatment Gait belt   Activity Tolerance Patient tolerated treatment well   Behavior During Therapy [facility] for tasks assessed/performed      Past Medical History:  Diagnosis Date  . Abnormality of gait as late effect of stroke 02/23/2017  . Acute hypoxemic respiratory failure (HCC)   . Atrial fibrillation with RVR (HCC) 02/17/2017  . CAD S/P percutaneous coronary angioplasty 2001  . CVA (cerebral vascular accident) (HCC) 01/2017  . Dysphagia as late effect of stroke 02/23/2017  . HCVD (hypertensive cardiovascular disease) 01/2017   normal LVF with moderate LVH, grade 1 DD  . High cholesterol   . Hypokalemia   . Labile blood pressure   . Leukocytosis   . Multiple sclerosis (HCC)   . PAF (paroxysmal atrial fibrillation) (HCC) 01/2017  . Right hemiparesis [facility])     Past Surgical History:  Procedure Laterality Date  . CORONARY ANGIOPLASTY WITH STENT PLACEMENT  11/1999   RCA DES-[facility]  . IR ANGIO INTRA EXTRACRAN SEL COM CAROTID INNOMINATE UNI R MOD SED  02/15/2017  . IR ANGIO VERTEBRAL SEL SUBCLAVIAN INNOMINATE UNI R MOD SED  02/15/2017  . IR INTRAVSC STENT CERV CAROTID W/O EMB-PROT MOD SED INC ANGIO  02/15/2017  . IR PERCUTANEOUS ART THROMBECTOMY/INFUSION INTRACRANIAL INC DIAG ANGIO  02/15/2017  . IR RADIOLOGIST EVAL & MGMT  05/10/2017  . NECK SURGERY     . RADIOLOGY WITH ANESTHESIA N/A 02/14/2017   Procedure: RADIOLOGY WITH ANESTHESIA;  Surgeon: Radiologist, Medication, MD;  Location: [facility] OR;  Service: Radiology;  Laterality: N/A;  . WRIST SURGERY      There were no vitals filed for this visit.      Subjective Assessment - 05/27/17 1022    Subjective No new complaints, no falls or pain to report. Cant tell that her balance has improved any yet. Is doing better about taking her RW with her.    Patient is accompained by: Family member   Patient Stated Goals "Standing up straight; getting my self-control" (balance)   Currently in Pain? No/denies                         [facility] Adult PT Treatment/Exercise - 05/27/17 0001      Transfers   Transfers Sit to Stand;Stand to Sit   Sit to Stand 4: Min guard   Sit to Stand Details (indicate cue type and reason) vc for safe use of DME and sequencing (scoot to edge, place feet)   Stand to Sit 4: Min guard   Stand to Sit Details for safety as she tried to "park" RW off to the side x 1     Ambulation/Gait   Ambulation/Gait Assistance 4: Min guard;4: Min assist   Ambulation/Gait Assistance Details rolling walker adjusted to proper height and bettter able  to stay close to RW; no device she is noted to drift left or right, decreased step length, incr dragging rt foot   Ambulation Distance (Feet) 120 Feet  x 4   Assistive device Rolling walker;None   Gait Pattern Step-through pattern;Decreased stride length;Decreased step length - right;Decreased step length - left;Poor foot clearance - left;Poor foot clearance - right   Ambulation Surface Level   Stairs Yes   Stairs Assistance 4: Min assist   Stairs Assistance Details (indicate cue type and reason) supervision going up stairs, min assist going down, Pt cathing heel on steps going down; vc for proper sequencing on descent for stronger leg   Stair Management Technique Two rails;Alternating pattern;Step to pattern;Forwards   Number of  Stairs 4  x2   Height of Stairs 6   Pre-Gait Activities ant-post weight-shiffitng in // bars x 10 with RLE lead, then LLE; walking in // bars with exaggereated heel strike (could not maintain heelstrike when left // bars; appeared distracted by others in the gym     Knee/Hip Exercises: Standing   Forward Step Up Both;1 set;10 reps;Hand Hold: 2;Step Height: 6"   Forward Step Up Limitations vc for slow descent with RLE     Knee/Hip Exercises: Seated   Other Seated Knee/Hip Exercises ankle DF with yellow theraband; 15 reps x 3; practiced having pt don/doff band and attempted different techniques/method of using band                PT Education - 05/27/17 1654    Education provided Yes   Education Details see HEP; continue to use RW at all times due to high fall risk   Person(s) Educated Spouse;Patient   Methods Explanation;Demonstration;Handout;Verbal cues   Comprehension Verbalized understanding;Returned demonstration;Verbal cues required;Need further instruction          PT Short Term Goals - 05/25/17 1052      PT SHORT TERM GOAL #1   Title Patient (with supervision to min-guard assistance of her spouse) will perform basic HEP for balance and strengthening. (Target STGs 06/04/17)   Time 4   Period Weeks   Status New     PT SHORT TERM GOAL #2   Title Patient will complete FGA with STG and LTG set as appropriate.   Baseline 10/10 FGA 9/30; 10/16 goal set   Time 1   Period Weeks   Status Achieved     PT SHORT TERM GOAL #3   Title Patient will improve TUG with or without a device to <15 seconds to demonstrate a lesser fall risk.    Baseline rollator 18.25; no device 17.81 sec   Time 4   Period Weeks   Status New     PT SHORT TERM GOAL #4   Title Patient will improve her gait velocity to 2.62 ft/sec to indicate lesser fall risk with community ambulation.    Baseline 2.25 ft/sec   Time 4   Period Weeks   Status New     PT SHORT TERM GOAL #5   Title Patient will  improve FGA score to >=14/30 to demonstrate progress towards lesser fall risk   Time 3   Period Weeks   Status New           PT Long Term Goals - 05/25/17 1052      PT LONG TERM GOAL #1   Title Patient and/or husband will be able to verbalize signs and symptoms of CVA and appropriate action to take. (Target LTGs: 07/04/17)  Time 8   Period Weeks   Status New     PT LONG TERM GOAL #2   Title Patient (with supervision of husband) will perform updated HEP   Time 8   Period Weeks   Status New     PT LONG TERM GOAL #3   Title Patient will ambulate >=2.79 ft/sec (age based norm) with LRAD or no device on level surface.   Time 8   Period Weeks   Status New     PT LONG TERM GOAL #4   Title Patient will ambulate >= 1000 ft on outdoor surfaces (level and unlevel, grass, gravel, mulch) with LRAD and supervision.    Time 8   Period Weeks   Status New     PT LONG TERM GOAL #5   Title Patient will perform TUG <13.5 seconds to demonstrate lesser fall risk.   Time 8   Period Weeks   Status New     PT LONG TERM GOAL #6   Title Patient will improve FGA score by 4 points above her score assessed at STGs (TBA).   Baseline 10/16 goal set per STG set on eval   Time 7   Period Weeks   Status New               Plan - 05/27/17 1657    Clinical Impression Statement Session focused on strengthening, gait and balance training. Patient continues with decr balance and poor awareness of imbalance without use of RW. Draggging RLE. Husband states he will bring her AFO to next visit. ?try foot-up brace if she does not want to wear her AFO. Pateint can continue to benefit from PT to work towards goals.    Rehab Potential Good   Clinical Impairments Affecting Rehab Potential impulsivity and decr awareness of deficits   PT Frequency 2x / week   PT Duration 8 weeks   PT Treatment/Interventions ADLs/Self Care Home Management;DME Instruction;Gait training;Stair training;Functional mobility  training;Orthotic Fit/Training;Patient/family education;Cognitive remediation;Neuromuscular re-education;Balance training;Therapeutic exercise;Therapeutic activities   PT Next Visit Plan check STGs by 11/3; husband to bring Rt AFO (pt has not been wearing); try AFO vs foot-up brace; continue to work on standing balance in reaching, wt-shifting tasks with single UE support, and LE strengthening    Consulted and Agree with Plan of Care Patient;Family member/caregiver   Family Member Consulted spouse, [name]      Patient will benefit from skilled therapeutic intervention in order to improve the following deficits and impairments:  Abnormal gait, Decreased balance, Decreased cognition, Decreased knowledge of use of DME, Decreased mobility, Decreased safety awareness, Decreased strength, Difficulty walking, Impaired perceived functional ability, Impaired sensation, Impaired UE functional use  Visit Diagnosis: Unsteadiness on feet  Other abnormalities of gait and mobility  Muscle weakness (generalized)     Problem List Patient Active Problem List   Diagnosis Date Noted  . Multiple sclerosis (HCC) 05/12/2017  . CAD (coronary artery disease) 04/12/2017  . Hypertension 04/11/2017  . Right hemiparesis (HCC)   . Aspiration pneumonia of right lung (HCC)   . Leukocytosis   . Hypokalemia   . Labile blood pressure   . Abnormality of gait as late effect of stroke 02/23/2017  . Dysarthria as late effect of stroke 02/23/2017  . Dysphagia as late effect of stroke 02/23/2017  . Acute ischemic left middle cerebral artery (MCA) stroke (HCC) 02/22/2017  . Paroxysmal A-fib (HCC) 02/17/2017  . Atrial fibrillation with RVR (HCC) 02/17/2017  . HLD (hyperlipidemia) 02/17/2017  .  Acute hypoxemic respiratory failure (HCC)   . Stroke (cerebrum) (HCC) - Acute MCA infarct due to left CCA/ICA and MCA occlusion, s/p mechanical thrombectomy and left ICA stenting in the setting of newly diagnosed afib 02/14/2017     Zena Amos, PT 05/27/2017, 5:01 PM  [facility] [address] [place], [place], [address] Phone: [phone]   Fax:  [phone]  Name: Jelissa Danzig MRN: [id number] Date of Birth: 08/07/1945

## 2017-05-27 NOTE — Patient Instructions (Signed)
  Please complete the assigned speech therapy homework prior to your next session and return it to the speech therapist at your next visit.  

## 2017-05-27 NOTE — Therapy (Signed)
[facility] [address] [place], [place], [address] Phone: [phone]   Fax:  [phone]  Speech Language Pathology Treatment  Patient Details  Name: Kaitlin [name] MRN: [id number] Date of Birth: 03/15/1946 Referring Provider: Claudette Laws, MD  Encounter Date: 05/27/2017      End of Session - 05/27/17 1238    Visit Number 5   Number of Visits 17   Date for SLP Re-Evaluation 07/11/17   SLP Start Time 1105   SLP Stop Time  1146   SLP Time Calculation (min) 41 min      Past Medical History:  Diagnosis Date  . Abnormality of gait as late effect of stroke 02/23/2017  . Acute hypoxemic respiratory failure (HCC)   . Atrial fibrillation with RVR (HCC) 02/17/2017  . CAD S/P percutaneous coronary angioplasty 2001  . CVA (cerebral vascular accident) (HCC) 01/2017  . Dysphagia as late effect of stroke 02/23/2017  . HCVD (hypertensive cardiovascular disease) 01/2017   normal LVF with moderate LVH, grade 1 DD  . High cholesterol   . Hypokalemia   . Labile blood pressure   . Leukocytosis   . Multiple sclerosis (HCC)   . PAF (paroxysmal atrial fibrillation) (HCC) 01/2017  . Right hemiparesis [facility])     Past Surgical History:  Procedure Laterality Date  . CORONARY ANGIOPLASTY WITH STENT PLACEMENT  11/1999   [facility]CA DES[facility]  . IR ANGIO INTRA EXTRACRAN SEL COM CAROTID INNOMINATE UNI R MOD SED  02/15/2017  . IR ANGIO VERTEBRAL SEL SUBCLAVIAN INNOMINATE UNI R MOD SED  02/15/2017  . IR INTRAVSC STENT CERV CAROTID W/O EMB-PROT MOD SED INC ANGIO  02/15/2017  . IR PERCUTANEOUS ART THROMBECTOMY/INFUSION INTRACRANIAL INC DIAG ANGIO  02/15/2017  . IR RADIOLOGIST EVAL & MGMT  05/10/2017  . NECK SURGERY    . RADIOLOGY WITH ANESTHESIA N/A 02/14/2017   Procedure: RADIOLOGY WITH ANESTHESIA;  Surgeon: Radiologist, Medication, MD;  Location: [facility] OR;  Service: Radiology;  Laterality: N/A;  . WRIST SURGERY      There were no vitals filed  for this visit.      Subjective Assessment - 05/27/17 1108    Subjective "I need to use this (walker) and - - - well, not just carry it."   Patient is accompained by: Family member  husband [name]   Currently in Pain? No/denies               ADULT SLP TREATMENT - 05/27/17 1109      General Information   Behavior/Cognition Alert;Cooperative;Pleasant mood;Requires cueing     Treatment Provided   Treatment provided Cognitive-Linquistic     Cognitive-Linquistic Treatment   Treatment focused on Aphasia;Patient/family/caregiver education   Skilled Treatment SLP targeted pt's expressive language today - pt wrote letters to dictation very well and so SLP encouraged husband to use this during homework tasks. Pt with tangential questions/comments today completely appropriate ("Aren't you hot?", "There's a train somewhere.") Pt appears very unwilling or anxious to verbalize incorrect responses especially seen as SLP trying to get pt to use compensations for her anomia. This will  need to be an ongoing conversation for pt to feel more comfortable with failure. She named less-common clothing items with 80% success, and named brands when shown their icon 30%, increased to 60% when written letter (dictated to pt) were used.      Assessment / Recommendations / Plan   Plan Continue with current plan of care     Progression Toward Goals  Progression toward goals Progressing toward goals          SLP Education - 05/27/17 1237    Education provided Yes   Education Details use pt's preserved ability to write letters to dictation to foster naming   Person(s) Educated Patient;Spouse   Methods Explanation;Demonstration   Comprehension Verbalized understanding          SLP Short Term Goals - 05/27/17 1239      SLP SHORT TERM GOAL #1   Title pt will generate 8 items in a simple category with compensations allowed   Time 2   Period Weeks   Status On-going     SLP SHORT TERM GOAL #2    Title pt will provide sentence level responses using language compensations 80% success   Time 2   Period Weeks   Status On-going     SLP SHORT TERM GOAL #3   Title pt will demo understanding of 5 minutes mod complex conversation, by providing appropriate responses in conversation 95% over three sessions   Time 2   Period Weeks   Status On-going     SLP SHORT TERM GOAL #4   Title pt will demo appropriate breath support 95% for improvement in voice volume at word level   Time 2   Period Weeks   Status On-going          SLP Long Term Goals - 05/27/17 1239      SLP LONG TERM GOAL #1   Title pt will demo appropriate breath support for adequate speech volume in 10 minutes simple conversation    Time 6   Period Weeks   Status On-going     SLP LONG TERM GOAL #2   Title pt will demo understanding of 10 minutes mod-complex conversation with request for repeats PRN   Time 6   Period Weeks   Status On-going     SLP LONG TERM GOAL #3   Title Pt will complete mod complex/complex naming tasks with occasional min A   Time 6   Period Weeks   Status On-going     SLP LONG TERM GOAL #4   Title pt will produce 10 minutes simple-mod complex conversation using compensations for anomia over three sessions   Time 6   Period Weeks   Status On-going          Plan - 05/27/17 1238    Clinical Impression Statement Pt presents today with cont'd expressive aphasia, and some receptive aphasia. Pt continues to ask husband for assistance whether in conversation or with specific speech tasks. Pt's husband used binder for pt's homework this week. SLP educated and cued pt husband how to assist pt with anomia/dysnomia and with homework, specifically how to use pt's preserved ability wiht writing letters to dictation. Pt will cont to receive skilled ST to address aphasia, dysarthria (volume), and possibly memory should OT not address.    Speech Therapy Frequency 2x / week   Treatment/Interventions  Language facilitation;Internal/external aids;Compensatory techniques;SLP instruction and feedback;Multimodal communcation approach;Cognitive reorganization;Functional tasks;Cueing hierarchy;Compensatory strategies;Patient/family education   Potential to Achieve Goals Fair   Potential Considerations Co-morbidities;Severity of impairments;Ability to learn/carryover information;Previous level of function   Consulted and Agree with Plan of Care Patient;Family member/caregiver   Family Member Consulted husband      Patient will benefit from skilled therapeutic intervention in order to improve the following deficits and impairments:   Aphasia  Dysarthria and anarthria  Cognitive communication deficit    Problem List Patient Active  Problem List   Diagnosis Date Noted  . Multiple sclerosis (HCC) 05/12/2017  . CAD (coronary artery disease) 04/12/2017  . Hypertension 04/11/2017  . Right hemiparesis (HCC)   . Aspiration pneumonia of right lung (HCC)   . Leukocytosis   . Hypokalemia   . Labile blood pressure   . Abnormality of gait as late effect of stroke 02/23/2017  . Dysarthria as late effect of stroke 02/23/2017  . Dysphagia as late effect of stroke 02/23/2017  . Acute ischemic left middle cerebral artery (MCA) stroke (HCC) 02/22/2017  . Paroxysmal A-fib (HCC) 02/17/2017  . Atrial fibrillation with RVR (HCC) 02/17/2017  . HLD (hyperlipidemia) 02/17/2017  . Acute hypoxemic respiratory failure (HCC)   . Stroke (cerebrum) (HCC) - Acute MCA infarct due to left CCA/ICA and MCA occlusion, s/p mechanical thrombectomy and left ICA stenting in the setting of newly diagnosed afib 02/14/2017    [facility] ,MS, CCC-SLP  05/27/2017, 12:40 PM  [facility] [address] [place], [place], [address] Phone: [phone]   Fax:  [phone]   Name: Bradyn Vassey MRN: [id number] Date of Birth: Feb 08, 1946

## 2017-05-27 NOTE — Patient Instructions (Signed)
ANKLE: Dorsiflexion (Band)    Sit at edge of surface. Place band around right foot, then twist the band to make a "figure 8 shape" and place toe of left foot through the second loop. Use other foot to hold band against the floor. Separate your feet to put tension on the band. Raise toes of right foot. Do as many reps as tolerated while seated watching TV. Do _5__ days per week  Copyright  VHI. All rights reserved.

## 2017-05-30 ENCOUNTER — Ambulatory Visit: Payer: Medicare Other | Admitting: Occupational Therapy

## 2017-05-30 ENCOUNTER — Ambulatory Visit: Payer: Medicare Other | Admitting: Physical Therapy

## 2017-05-30 ENCOUNTER — Encounter: Payer: Self-pay | Admitting: Physical Therapy

## 2017-05-30 ENCOUNTER — Ambulatory Visit: Payer: Medicare Other | Admitting: Speech Pathology

## 2017-05-30 DIAGNOSIS — I69353 Hemiplegia and hemiparesis following cerebral infarction affecting right non-dominant side: Secondary | ICD-10-CM

## 2017-05-30 DIAGNOSIS — M6281 Muscle weakness (generalized): Secondary | ICD-10-CM

## 2017-05-30 DIAGNOSIS — I69318 Other symptoms and signs involving cognitive functions following cerebral infarction: Secondary | ICD-10-CM

## 2017-05-30 DIAGNOSIS — R471 Dysarthria and anarthria: Secondary | ICD-10-CM

## 2017-05-30 DIAGNOSIS — R2689 Other abnormalities of gait and mobility: Secondary | ICD-10-CM

## 2017-05-30 DIAGNOSIS — R4701 Aphasia: Secondary | ICD-10-CM

## 2017-05-30 DIAGNOSIS — R2681 Unsteadiness on feet: Secondary | ICD-10-CM

## 2017-05-30 DIAGNOSIS — R278 Other lack of coordination: Secondary | ICD-10-CM

## 2017-05-30 DIAGNOSIS — R41841 Cognitive communication deficit: Secondary | ICD-10-CM

## 2017-05-30 NOTE — Therapy (Signed)
[facility] [address] [place], [place], [address] Phone: [phone]   Fax:  [phone]  Speech Language Pathology Treatment  Patient Details  Name: Kaitlin [name] MRN: [id number] Date of Birth: 09-12-1945 Referring Provider: Claudette Laws, MD  Encounter Date: 05/30/2017      End of Session - 05/30/17 1616    Visit Number 6   Number of Visits 17   Date for SLP Re-Evaluation 07/11/17   SLP Start Time 0847   SLP Stop Time  0930   SLP Time Calculation (min) 43 min   Activity Tolerance Patient tolerated treatment well      Past Medical History:  Diagnosis Date  . Abnormality of gait as late effect of stroke 02/23/2017  . Acute hypoxemic respiratory failure (HCC)   . Atrial fibrillation with RVR (HCC) 02/17/2017  . CAD S/P percutaneous coronary angioplasty 2001  . CVA (cerebral vascular accident) (HCC) 01/2017  . Dysphagia as late effect of stroke 02/23/2017  . HCVD (hypertensive cardiovascular disease) 01/2017   normal LVF with moderate LVH, grade 1 DD  . High cholesterol   . Hypokalemia   . Labile blood pressure   . Leukocytosis   . Multiple sclerosis (HCC)   . PAF (paroxysmal atrial fibrillation) (HCC) 01/2017  . Right hemiparesis [facility])     Past Surgical History:  Procedure Laterality Date  . CORONARY ANGIOPLASTY WITH STENT PLACEMENT  11/1999   RCA DES-[facility]  . IR ANGIO INTRA EXTRACRAN SEL COM CAROTID INNOMINATE UNI R MOD SED  02/15/2017  . IR ANGIO VERTEBRAL SEL SUBCLAVIAN INNOMINATE UNI R MOD SED  02/15/2017  . IR INTRAVSC STENT CERV CAROTID W/O EMB-PROT MOD SED INC ANGIO  02/15/2017  . IR PERCUTANEOUS ART THROMBECTOMY/INFUSION INTRACRANIAL INC DIAG ANGIO  02/15/2017  . IR RADIOLOGIST EVAL & MGMT  05/10/2017  . NECK SURGERY    . RADIOLOGY WITH ANESTHESIA N/A 02/14/2017   Procedure: RADIOLOGY WITH ANESTHESIA;  Surgeon: Radiologist, Medication, MD;  Location: [facility] OR;  Service: Radiology;  Laterality:  N/A;  . WRIST SURGERY      There were no vitals filed for this visit.      Subjective Assessment - 05/30/17 0854    Subjective Pt arrives carrying walker   Patient is accompained by: Family member  [name]   Currently in Pain? No/denies               ADULT SLP TREATMENT - 05/30/17 0907      General Information   Behavior/Cognition Alert;Cooperative;Pleasant mood;Requires cueing   Patient Positioning Upright in chair   Oral care provided N/A     Treatment Provided   Treatment provided Cognitive-Linquistic     Pain Assessment   Pain Assessment No/denies pain     Cognitive-Linquistic Treatment   Treatment focused on Aphasia;Patient/family/caregiver education   Skilled Treatment SLP targeted expressive language and reading comprehension today. Pt named 8 items for 2 simple categories with description, sentence completion, and question cues required after 4 items. With initial first-letter cue, pt able to spell aloud, write some words to self cue. Sentence level reading comprehension/word choice 80% accuracy.      Assessment / Recommendations / Plan   Plan Continue with current plan of care     Progression Toward Goals   Progression toward goals Progressing toward goals          SLP Education - 05/30/17 1614    Education provided Yes   Education Details writing/ spelling to self-cue word retrieval  Person(s) Educated Patient;Spouse   Methods Explanation;Demonstration;Verbal cues   Comprehension Verbalized understanding;Returned demonstration          SLP Short Term Goals - 05/30/17 1617      SLP SHORT TERM GOAL #1   Title pt will generate 8 items in a simple category with compensations allowed   Time 1   Period Weeks   Status On-going     SLP SHORT TERM GOAL #2   Title pt will provide sentence level responses using language compensations 80% success   Time 1   Period Weeks   Status On-going     SLP SHORT TERM GOAL #3   Title pt will demo  understanding of 5 minutes mod complex conversation, by providing appropriate responses in conversation 95% over three sessions   Time 1   Period Weeks   Status On-going     SLP SHORT TERM GOAL #4   Title pt will demo appropriate breath support 95% for improvement in voice volume at word level   Time 1   Period Weeks   Status On-going          SLP Long Term Goals - 05/30/17 1618      SLP LONG TERM GOAL #1   Title pt will demo appropriate breath support for adequate speech volume in 10 minutes simple conversation    Time 5   Period Weeks   Status On-going     SLP LONG TERM GOAL #2   Title pt will demo understanding of 10 minutes mod-complex conversation with request for repeats PRN   Time 5   Period Weeks   Status On-going     SLP LONG TERM GOAL #3   Title Pt will complete mod complex/complex naming tasks with occasional min A   Time 5   Period Weeks   Status On-going     SLP LONG TERM GOAL #4   Title pt will produce 10 minutes simple-mod complex conversation using compensations for anomia over three sessions   Time 5   Period Weeks   Status On-going          Plan - 05/30/17 1616    Clinical Impression Statement Pt presents today with cont'd expressive aphasia, and some receptive aphasia. Pt continues to ask husband for assistance whether in conversation or with specific speech tasks. Pt's husband used binder for pt's homework this week. Pt will cont to receive skilled ST to address aphasia, dysarthria (volume), and possibly memory should OT not address.    Speech Therapy Frequency 2x / week   Treatment/Interventions Language facilitation;Internal/external aids;Compensatory techniques;SLP instruction and feedback;Multimodal communcation approach;Cognitive reorganization;Functional tasks;Cueing hierarchy;Compensatory strategies;Patient/family education   Potential to Achieve Goals Fair   Potential Considerations Co-morbidities;Severity of impairments;Ability to  learn/carryover information;Previous level of function   Consulted and Agree with Plan of Care Patient;Family member/caregiver   Family Member Consulted husband      Patient will benefit from skilled therapeutic intervention in order to improve the following deficits and impairments:   Aphasia  Dysarthria and anarthria  Cognitive communication deficit    Problem List Patient Active Problem List   Diagnosis Date Noted  . Multiple sclerosis (HCC) 05/12/2017  . CAD (coronary artery disease) 04/12/2017  . Hypertension 04/11/2017  . Right hemiparesis (HCC)   . Aspiration pneumonia of right lung (HCC)   . Leukocytosis   . Hypokalemia   . Labile blood pressure   . Abnormality of gait as late effect of stroke 02/23/2017  . Dysarthria as late  effect of stroke 02/23/2017  . Dysphagia as late effect of stroke 02/23/2017  . Acute ischemic left middle cerebral artery (MCA) stroke (HCC) 02/22/2017  . Paroxysmal A-fib (HCC) 02/17/2017  . Atrial fibrillation with RVR (HCC) 02/17/2017  . HLD (hyperlipidemia) 02/17/2017  . Acute hypoxemic respiratory failure (HCC)   . Stroke (cerebrum) (HCC) - Acute MCA infarct due to left CCA/ICA and MCA occlusion, s/p mechanical thrombectomy and left ICA stenting in the setting of newly diagnosed afib 02/14/2017   Rondel BatonMary Beth Rodricus Candelaria, MS, CCC-SLP Speech-Language Pathologist  Kaitlin LindauMary E Rekia [name] 05/30/2017, 4:24 PM   [facility] [address] [place], [place], [address] Phone: [phone]   Fax:  [phone]   Name: Kaitlin [name] MRN: [id number] Date of Birth: Jun 12, 1946

## 2017-05-30 NOTE — Therapy (Signed)
[facility] [address] [place], [place], [address] Phone: [phone]   Fax:  [phone]  Occupational Therapy Treatment  Patient Details  Name: Kaitlin [name] MRN: [id number] Date of Birth: 1946-03-13 Referring Provider: Dr. Letta Pate  Encounter Date: 05/30/2017      OT End of Session - 05/30/17 1114    Visit Number 7   Number of Visits 17   Date for OT Re-Evaluation 07/04/17   Authorization Type [employer] MCR - G code needed   Authorization - Visit Number 7   Authorization - Number of Visits 10   OT Start Time 1017   OT Stop Time 1108   OT Time Calculation (min) 51 min   Activity Tolerance Patient tolerated treatment well      Past Medical History:  Diagnosis Date  . Abnormality of gait as late effect of stroke 02/23/2017  . Acute hypoxemic respiratory failure ([place])   . Atrial fibrillation with RVR ([place]) 02/17/2017  . CAD S/P percutaneous coronary angioplasty 2001  . CVA (cerebral vascular accident) ([place]) 01/2017  . Dysphagia as late effect of stroke 02/23/2017  . HCVD (hypertensive cardiovascular disease) 01/2017   normal LVF with moderate LVH, grade 1 DD  . High cholesterol   . Hypokalemia   . Labile blood pressure   . Leukocytosis   . Multiple sclerosis ([place])   . PAF (paroxysmal atrial fibrillation) ([place]) 01/2017  . Right hemiparesis [facility])     Past Surgical History:  Procedure Laterality Date  . CORONARY ANGIOPLASTY WITH STENT PLACEMENT  11/1999   RCA DES[facility]  . IR ANGIO INTRA EXTRACRAN SEL COM CAROTID INNOMINATE UNI R MOD SED  02/15/2017  . IR ANGIO VERTEBRAL SEL SUBCLAVIAN INNOMINATE UNI R MOD SED  02/15/2017  . IR INTRAVSC STENT CERV CAROTID W/O EMB-PROT MOD SED INC ANGIO  02/15/2017  . IR PERCUTANEOUS ART THROMBECTOMY/INFUSION INTRACRANIAL INC DIAG ANGIO  02/15/2017  . IR RADIOLOGIST EVAL & MGMT  05/10/2017  . NECK SURGERY    . RADIOLOGY WITH ANESTHESIA N/A 02/14/2017   Procedure: RADIOLOGY WITH  ANESTHESIA;  Surgeon: Radiologist, Medication, MD;  Location: [place];  Service: Radiology;  Laterality: N/A;  . WRIST SURGERY      There were no vitals filed for this visit.      Subjective Assessment - 05/30/17 1020    Patient is accompained by: Family member   Pertinent History CVA 02/14/17 PMH: MS for approx 20 years (but no exacerbation for 10 years), A-fib, CAD w/ stent in 2000, white matter dz   Patient Stated Goals to increase balance   Currently in Pain? No/denies            [facility] OT Assessment - 05/30/17 0001      Coordination   Right 9 Hole Peg Test 54.25 sec     Hand Function   Right Hand Grip (lbs) 30 lbs                  OT Treatments/Exercises (OP) - 05/30/17 0001      ADLs   ADL Comments Began assessing progress towards STG's. (see assessment and goal section)     Exercises   Exercises Shoulder     Shoulder Exercises: ROM/Strengthening   Other ROM/Strengthening Exercises Pt issued cane HEP - see pt instructions for details. Attempted use of 1 lb. dumbbells but pt compensating too much at shoulder, and with Rt neglect;  therefore cane with weight on it was the safest/best option  Visual/Perceptual Exercises   Copy this Image Other   Other Pt copying flower designs using Rt hand with mod to max cues to use Rt hand, and min to mod cues for visual/perceptual skills                OT Education - 05/30/17 1042    Education provided Yes   Education Details Cane HEP    Person(s) Educated Patient;Spouse   Methods Explanation;Demonstration;Handout   Comprehension Verbalized understanding;Returned demonstration          OT Short Term Goals - 05/30/17 1115      OT SHORT TERM GOAL #1   Title Pt/family independent with coordination and putty HEP    Time 4   Period Weeks   Status Achieved     OT SHORT TERM GOAL #2   Title Pt/family independent with HEP for RUE shoulder ROM   Time 4   Period Weeks   Status Achieved     OT SHORT  TERM GOAL #3   Title Pt/family to verbalize understanding with safety with shower transfers and bathing with DME/AE prn   Time 4   Period Weeks   Status Achieved     OT SHORT TERM GOAL #4   Title Pt/family to verbalize understanding with memory strategies for sequencing with donning shirt/pants, sequencing and safety with use of walker/rollator, and for medication management   Time 4   Period Weeks   Status On-going     OT SHORT TERM GOAL #5   Title Pt to improve grip strength by 10 lbs Rt hand    Baseline 22 lbs   Time 4   Period Weeks   Status Partially Met  Rt = 30 lbs (improved by 8 lbs)      OT SHORT TERM GOAL #6   Title Pt to improve coordination Rt hand as evidenced by performing 9 hole peg test to 100 sec. or less   Baseline 2 min. 6 sec   Time 4   Period Weeks   Status Achieved  54.25 sec           OT Long Term Goals - 05/30/17 1119      OT LONG TERM GOAL #1   Title Pt to retrieve light weight object from high shelf RUE requiring 120 degrees shoulder flexion w/o drops 5/5 trials   Time 8   Period Weeks   Status On-going     OT LONG TERM GOAL #2   Title Pt to perform simple snack prep and light IADLS (washing dishes, laundry tasks) demo safety with walker w/o LOB   Time 8   Period Weeks   Status On-going     OT LONG TERM GOAL #3   Title Pt to implement memory strategies into ADLS including sequencing of donning clothes, walker negotiation, and medication management with supervision and no more than min cues   Time 8   Period Weeks   Status On-going     OT LONG TERM GOAL #4   Title Pt to improve coordination Rt hand as evidenced by performing 9 hole peg test in 45 sec. or less   Baseline 2 min. 6 sec   Time 8   Period Weeks   Status Revised     OT LONG TERM GOAL #5   Title Pt to tie shoes and style hair mod I level with A/E prn   Time 8   Period Weeks   Status On-going  Plan - 05/30/17 1117    Clinical Impression  Statement Pt met 4/6 STG's and making progress towards Rt grip strength. Pt still requires mod to max cueing to use Rt hand, and for safety with balance and use of walker.    Rehab Potential Fair   Current Impairments/barriers affecting progress: severity of deficits including aphasia, cognition   OT Frequency 2x / week   OT Duration 8 weeks   OT Treatment/Interventions Self-care/ADL training;DME and/or AE instruction;Patient/family education;Therapeutic exercises;Therapeutic activities;Functional Mobility Training;Passive range of motion;Cognitive remediation/compensation;Visual/perceptual remediation/compensation;Manual Therapy;Moist Heat   Plan balance through functional tasks, continue use of Rt hand and incorporate cognition as able   Consulted and Agree with Plan of Care Patient;Family member/caregiver   Family Member Consulted husband-[name]      Patient will benefit from skilled therapeutic intervention in order to improve the following deficits and impairments:  Decreased coordination, Decreased range of motion, Impaired flexibility, Impaired sensation, Decreased safety awareness, Decreased knowledge of precautions, Impaired tone, Impaired UE functional use, Decreased knowledge of use of DME, Decreased mobility, Decreased balance, Decreased cognition, Decreased strength, Impaired perceived functional ability, Impaired vision/preception  Visit Diagnosis: Hemiplegia and hemiparesis following cerebral infarction affecting right non-dominant side (HCC)  Other lack of coordination  Muscle weakness (generalized)  Other symptoms and signs involving cognitive functions following cerebral infarction    Problem List Patient Active Problem List   Diagnosis Date Noted  . Multiple sclerosis ([place]) 05/12/2017  . CAD (coronary artery disease) 04/12/2017  . Hypertension 04/11/2017  . Right hemiparesis ([place])   . Aspiration pneumonia of right lung ([place])   . Leukocytosis   . Hypokalemia   .  Labile blood pressure   . Abnormality of gait as late effect of stroke 02/23/2017  . Dysarthria as late effect of stroke 02/23/2017  . Dysphagia as late effect of stroke 02/23/2017  . Acute ischemic left middle cerebral artery (MCA) stroke ([place]) 02/22/2017  . Paroxysmal A-fib ([place]) 02/17/2017  . Atrial fibrillation with RVR ([place]) 02/17/2017  . HLD (hyperlipidemia) 02/17/2017  . Acute hypoxemic respiratory failure ([place])   . Stroke (cerebrum) ([place]) - Acute MCA infarct due to left CCA/ICA and MCA occlusion, s/p mechanical thrombectomy and left ICA stenting in the setting of newly diagnosed afib 02/14/2017    Carey Bullocks, OTR/L 05/30/2017, 11:20 AM  [facility] [address] [place], [place], [address] Phone: [phone]   Fax:  [phone]  Name: Kaitlin [name] MRN: [id number] Date of Birth: 1945-10-29

## 2017-05-30 NOTE — Therapy (Signed)
[facility] [address] [place], [place], [address] Phone: [phone]   Fax:  [phone]  Physical Therapy Treatment  Patient Details  Name: Kaitlin [name] MRN: [id number] Date of Birth: September 23, 1945 Referring Provider: Alysia Penna MD  Encounter Date: 05/30/2017      PT End of Session - 05/30/17 1018    Visit Number 6   Number of Visits 17   Date for PT Re-Evaluation 07/04/17   Authorization Type [employer] MCR   Authorization Time Period 05/05/17  to  07/04/17   PT Start Time 0930   PT Stop Time 1017   PT Time Calculation (min) 47 min   Equipment Utilized During Treatment Gait belt   Activity Tolerance Patient tolerated treatment well   Behavior During Therapy [facility] for tasks assessed/performed      Past Medical History:  Diagnosis Date  . Abnormality of gait as late effect of stroke 02/23/2017  . Acute hypoxemic respiratory failure ([place])   . Atrial fibrillation with RVR ([place]) 02/17/2017  . CAD S/P percutaneous coronary angioplasty 2001  . CVA (cerebral vascular accident) ([place]) 01/2017  . Dysphagia as late effect of stroke 02/23/2017  . HCVD (hypertensive cardiovascular disease) 01/2017   normal LVF with moderate LVH, grade 1 DD  . High cholesterol   . Hypokalemia   . Labile blood pressure   . Leukocytosis   . Multiple sclerosis ([place])   . PAF (paroxysmal atrial fibrillation) ([place]) 01/2017  . Right hemiparesis [facility])     Past Surgical History:  Procedure Laterality Date  . CORONARY ANGIOPLASTY WITH STENT PLACEMENT  11/1999   RCA DES[facility]  . IR ANGIO INTRA EXTRACRAN SEL COM CAROTID INNOMINATE UNI R MOD SED  02/15/2017  . IR ANGIO VERTEBRAL SEL SUBCLAVIAN INNOMINATE UNI R MOD SED  02/15/2017  . IR INTRAVSC STENT CERV CAROTID W/O EMB-PROT MOD SED INC ANGIO  02/15/2017  . IR PERCUTANEOUS ART THROMBECTOMY/INFUSION INTRACRANIAL INC DIAG ANGIO  02/15/2017  . IR RADIOLOGIST EVAL & MGMT  05/10/2017  . NECK SURGERY     . RADIOLOGY WITH ANESTHESIA N/A 02/14/2017   Procedure: RADIOLOGY WITH ANESTHESIA;  Surgeon: Radiologist, Medication, MD;  Location: [facility];  Service: Radiology;  Laterality: N/A;  . WRIST SURGERY      There were no vitals filed for this visit.      Subjective Assessment - 05/30/17 0933    Subjective No new complaints, no falls or pain to report.    Patient is accompained by: Family member   Patient Stated Goals "Standing up straight; getting my self-control" (balance)   Currently in Pain? No/denies            [facility] PT Assessment - 05/30/17 0001      Timed Up and Go Test   TUG Normal TUG   Normal TUG (seconds) 18.1  without AD; 26.59 with AD             [facility] Adult PT Treatment/Exercise - 05/30/17 0001      Ambulation/Gait   Ambulation/Gait Yes   Ambulation/Gait Assistance 4: Min guard   Ambulation/Gait Assistance Details No AD with gait, increased foot clearance with gait while wearing AFO. Min gaurd during ambulation    Ambulation Distance (Feet) 100 Feet  from mat to counter 3f x4   Assistive device None   Gait Pattern Step-through pattern;Decreased hip/knee flexion - right;Decreased hip/knee flexion - left;Decreased stride length   Ambulation Surface Level;Indoor   Gait velocity 32.8 ft/16.06 sec=2.04 ft/sec without  AD  32.8 ft/ 26.59 sec= 1.23 ft/sec with AD     Balance   Balance Assessed Yes     High Level Balance   High Level Balance Activities Tandem walking   High Level Balance Comments Pt tandem walking fwds/bkwds x5 in front of counter with SUE support, Pt demonstrated moderate loss of balance and trouble getting one foot in front of the other. Pt min/mod assist with this task.      Neuro Re-ed    Neuro Re-ed Details  Pt standing on pillow with feet together in corner with chair in front for support as needed. EC- static standing with arms at sides/crossed/abducted 30 secs holds x3 reps. Pt standing with feet together on pillows in corner with chair  for support, EC with head movements- head nods/head turns/diagonals both ways x10 reps      Exercises   Exercises Knee/Hip;Ankle     Knee/Hip Exercises: Seated   Marching AROM;Strengthening;Both;5 reps;Limitations   Marching Limitations Pt standing at counter with SUE support, walking marches fwd/bkwds, min assist with some instability and cues to bring knees higher.    Sit to Sand 10 reps;without UE support;Other (comment)  cues to lean forward prior to standing     Ankle Exercises: Standing   SLS Pt standing at counter with SUE support, x3 reps/10 sec holds. Pt tolerated this task well with no loss of balance or fatigue.    Heel Raises 5 seconds;10 reps   Heel Raises Limitations Heel/toe raises rocking at counter with SUE, some limitations with coming up on heels, min assist due to slight loss of balance on heels.      Ankle Exercises: Seated   Other Seated Ankle Exercises DF with YTB, modified from knee bent to knee straightened, stepping on end of band with other foot, x10 reps BLE           PT Education - 05/30/17 1056    Education provided Yes   Education Details Modified HEP DF exercise with YTB from knee bent to knee straight while pt manually adding pressure on knee with hand.    Person(s) Educated Patient   Methods Explanation;Demonstration   Comprehension Verbalized understanding;Returned demonstration          PT Short Term Goals - 05/30/17 1058      PT SHORT TERM GOAL #1   Title Patient (with supervision to min-guard assistance of her spouse) will perform basic HEP for balance and strengthening. (Target STGs 06/04/17)   Baseline 05/30/17: goals achieved   Time 4   Period Weeks   Status Achieved     PT SHORT TERM GOAL #2   Title Patient will complete FGA with STG and LTG set as appropriate.   Baseline 10/10 FGA 9/30; 10/16 goal set   Time 1   Period Weeks   Status Achieved     PT SHORT TERM GOAL #3   Title Patient will improve TUG with or without a  device to <15 seconds to demonstrate a lesser fall risk.    Baseline rollator 18.25; no device 17.81 sec. 05/30/17: No AD 18.10/ RW 26.59   Time 4   Period Weeks   Status Not Met     PT SHORT TERM GOAL #4   Title Patient will improve her gait velocity to 2.62 ft/sec to indicate lesser fall risk with community ambulation.    Baseline 05/30/17: 2.04 ft/sec without AD 1.66f/sec with AD   Time 4   Period Weeks   Status Not  Met     PT SHORT TERM GOAL #5   Title Patient will improve FGA score to >=14/30 to demonstrate progress towards lesser fall risk   Time 3   Period Weeks   Status On-going           PT Long Term Goals - 05/30/17 1106      PT LONG TERM GOAL #1   Title Patient and/or husband will be able to verbalize signs and symptoms of CVA and appropriate action to take. (Target LTGs: 07/04/17)   Time 8   Period Weeks   Status New     PT LONG TERM GOAL #2   Title Patient (with supervision of husband) will perform updated HEP   Time 8   Period Weeks   Status New     PT LONG TERM GOAL #3   Title Patient will ambulate >=2.79 ft/sec (age based norm) with LRAD or no device on level surface.   Time 8   Period Weeks   Status New     PT LONG TERM GOAL #4   Title Patient will ambulate >= 1000 ft on outdoor surfaces (level and unlevel, grass, gravel, mulch) with LRAD and supervision.    Time 8   Period Weeks   Status New     PT LONG TERM GOAL #5   Title Patient will perform TUG <13.5 seconds to demonstrate lesser fall risk.   Time 8   Period Weeks   Status New     PT LONG TERM GOAL #6   Title Patient will improve FGA score by 4 points above her score assessed at STGs (TBA).   Baseline 10/16 goal set per STG set on eval   Time 7   Period Weeks   Status New             Plan - 05/30/17 1106    Clinical Impression Statement Pt tolerated treatment well with no limitations due to fatigue or pain. Todays session focused on short term goals and balance training. Pt  did well with HEP review but still shows decreased gait velocity and TUG scores. Pt wore her AFO this session and walked with no AD and no loss of balance due to toe scuffing. Pt can continue to benefit from PT to work towards goals.    Rehab Potential Good   Clinical Impairments Affecting Rehab Potential impulsivity and decr awareness of deficits   PT Frequency 2x / week   PT Duration 8 weeks   PT Treatment/Interventions ADLs/Self Care Home Management;DME Instruction;Gait training;Stair training;Functional mobility training;Orthotic Fit/Training;Patient/family education;Cognitive remediation;Neuromuscular re-education;Balance training;Therapeutic exercise;Therapeutic activities   PT Next Visit Plan Complete last remaining short term goal. Continue to work on standing balance in reaching, wt-shifting tasks with single UE support, and LE strengthening    Consulted and Agree with Plan of Care Patient;Family member/caregiver   Family Member Consulted spouse, [name]      Patient will benefit from skilled therapeutic intervention in order to improve the following deficits and impairments:  Abnormal gait, Decreased balance, Decreased cognition, Decreased knowledge of use of DME, Decreased mobility, Decreased safety awareness, Decreased strength, Difficulty walking, Impaired perceived functional ability, Impaired sensation, Impaired UE functional use  Visit Diagnosis: Unsteadiness on feet  Other abnormalities of gait and mobility  Muscle weakness (generalized)     Problem List Patient Active Problem List   Diagnosis Date Noted  . Multiple sclerosis ([place]) 05/12/2017  . CAD (coronary artery disease) 04/12/2017  . Hypertension 04/11/2017  . Right  hemiparesis ([place])   . Aspiration pneumonia of right lung ([place])   . Leukocytosis   . Hypokalemia   . Labile blood pressure   . Abnormality of gait as late effect of stroke 02/23/2017  . Dysarthria as late effect of stroke 02/23/2017  . Dysphagia as  late effect of stroke 02/23/2017  . Acute ischemic left middle cerebral artery (MCA) stroke ([place]) 02/22/2017  . Paroxysmal A-fib ([place]) 02/17/2017  . Atrial fibrillation with RVR ([place]) 02/17/2017  . HLD (hyperlipidemia) 02/17/2017  . Acute hypoxemic respiratory failure ([place])   . Stroke (cerebrum) ([place]) - Acute MCA infarct due to left CCA/ICA and MCA occlusion, s/p mechanical thrombectomy and left ICA stenting in the setting of newly diagnosed afib 02/14/2017   Equan Cogbill, SPTA  Manveer Gomes 05/30/2017, 12:28 PM  [facility] [address] [place], [place], [address] Phone: [phone]   Fax:  [phone]  Name: Babita Amaker MRN: [id number] Date of Birth: [birth year]

## 2017-05-30 NOTE — Patient Instructions (Addendum)
SHOULDER: Flexion - Sitting    Hold cane with both hands. Raise arms up. Keep elbows straight. Hold _3__ seconds. Use __2_ lb weight on cane. _10__ reps per set, __2_ sets per day.    Abduction (Eccentric) - Active (Cane)    Lift cane out to Rt side with affected arm (Rt palm up). Avoid hiking shoulder. Keep palm relaxed. Slowly lower affected arm for 3-5 seconds. _10__ reps per set, _2__ sets per day. Add _2__ lbs    ROM: Extension - Wand (Standing)    Stand holding wand behind back, PALMS FORWARD. Raise arms 6 inches off bottom. Repeat __10__ times per set.  Do __2__ sessions per day. Make sure chair is behind you and doing at countertop or sturdy table for balance

## 2017-05-31 ENCOUNTER — Ambulatory Visit: Payer: Medicare Other

## 2017-05-31 ENCOUNTER — Ambulatory Visit: Payer: Medicare Other | Admitting: Physical Therapy

## 2017-05-31 ENCOUNTER — Encounter: Payer: Self-pay | Admitting: Occupational Therapy

## 2017-05-31 ENCOUNTER — Encounter: Payer: Self-pay | Admitting: Physical Therapy

## 2017-05-31 ENCOUNTER — Ambulatory Visit: Payer: Medicare Other | Admitting: Occupational Therapy

## 2017-05-31 DIAGNOSIS — M6281 Muscle weakness (generalized): Secondary | ICD-10-CM

## 2017-05-31 DIAGNOSIS — R2681 Unsteadiness on feet: Secondary | ICD-10-CM

## 2017-05-31 DIAGNOSIS — R4701 Aphasia: Secondary | ICD-10-CM | POA: Diagnosis not present

## 2017-05-31 DIAGNOSIS — R41841 Cognitive communication deficit: Secondary | ICD-10-CM

## 2017-05-31 DIAGNOSIS — R471 Dysarthria and anarthria: Secondary | ICD-10-CM

## 2017-05-31 DIAGNOSIS — R2689 Other abnormalities of gait and mobility: Secondary | ICD-10-CM

## 2017-05-31 DIAGNOSIS — R482 Apraxia: Secondary | ICD-10-CM

## 2017-05-31 DIAGNOSIS — I69353 Hemiplegia and hemiparesis following cerebral infarction affecting right non-dominant side: Secondary | ICD-10-CM

## 2017-05-31 DIAGNOSIS — R278 Other lack of coordination: Secondary | ICD-10-CM

## 2017-05-31 DIAGNOSIS — I69318 Other symptoms and signs involving cognitive functions following cerebral infarction: Secondary | ICD-10-CM

## 2017-05-31 NOTE — Patient Instructions (Signed)
  Each day:  Do the homework remaining from this week or last, for at least 30 minutes  Look at 10-15 pictures of a magazine, and describe what you see.

## 2017-05-31 NOTE — Therapy (Signed)
[facility] [address] [place], [place], [address] Phone: [phone]   Fax:  [phone]  Speech Language Pathology Treatment  Patient Details  Name: Kaitlin [name] MRN: [id number] Date of Birth: [birth year] Referring Provider: Alysia Penna, MD  Encounter Date: 05/31/2017      End of Session - 05/31/17 1603    Visit Number 7   Number of Visits 17   Date for SLP Re-Evaluation 07/11/17   SLP Start Time 0037   SLP Stop Time  1534   SLP Time Calculation (min) 42 min   Activity Tolerance Patient tolerated treatment well      Past Medical History:  Diagnosis Date  . Abnormality of gait as late effect of stroke 02/23/2017  . Acute hypoxemic respiratory failure ([place])   . Atrial fibrillation with RVR ([place]) 02/17/2017  . CAD S/P percutaneous coronary angioplasty 2001  . CVA (cerebral vascular accident) ([place]) 01/2017  . Dysphagia as late effect of stroke 02/23/2017  . HCVD (hypertensive cardiovascular disease) 01/2017   normal LVF with moderate LVH, grade 1 DD  . High cholesterol   . Hypokalemia   . Labile blood pressure   . Leukocytosis   . Multiple sclerosis ([place])   . PAF (paroxysmal atrial fibrillation) ([place]) 01/2017  . Right hemiparesis [facility])     Past Surgical History:  Procedure Laterality Date  . CORONARY ANGIOPLASTY WITH STENT PLACEMENT  11/1999   [facility]  . IR ANGIO INTRA EXTRACRAN SEL COM CAROTID INNOMINATE UNI R MOD SED  02/15/2017  . IR ANGIO VERTEBRAL SEL SUBCLAVIAN INNOMINATE UNI R MOD SED  02/15/2017  . IR INTRAVSC STENT CERV CAROTID W/O EMB-PROT MOD SED INC ANGIO  02/15/2017  . IR PERCUTANEOUS ART THROMBECTOMY/INFUSION INTRACRANIAL INC DIAG ANGIO  02/15/2017  . IR RADIOLOGIST EVAL & MGMT  05/10/2017  . NECK SURGERY    . RADIOLOGY WITH ANESTHESIA N/A 02/14/2017   Procedure: RADIOLOGY WITH ANESTHESIA;  Surgeon: Radiologist, Medication, MD;  Location: [facility];  Service: Radiology;  Laterality:  N/A;  . WRIST SURGERY      There were no vitals filed for this visit.      Subjective Assessment - 05/31/17 1455    Subjective "This is our last one today, isn't it?" (pt, to husband)   Patient is accompained by: --  husbnad, [name]   Currently in Pain? No/denies               ADULT SLP TREATMENT - 05/31/17 1455      General Information   Behavior/Cognition Alert;Cooperative;Pleasant mood;Requires cueing     Treatment Provided   Treatment provided Cognitive-Linquistic     Cognitive-Linquistic Treatment   Treatment focused on Aphasia;Patient/family/caregiver education  apraxia(?)   Skilled Treatment SLP targeted auditory comprehension with multiple meaning unit (5-6 meaning units) questions completed 90% accuracy. With verbal descriptions, pt matched a card f:4 with 9/11 success. Verbal expression: words of the correct pictures in the ID task were said 7/11. Pt provided simple sentences for job duties for specific occupations 9/12 (3-6 words). With mod A pt incr'd success to 11/12.      Assessment / Recommendations / Plan   Plan Continue with current plan of care;Goals updated  (LTGs downgraded)     Progression Toward Goals   Progression toward goals Progressing toward goals          SLP Education - 05/30/17 1614    Education provided Yes   Education Details writing/ spelling to self-cue word retrieval  Person(s) Educated Patient;Spouse   Methods Explanation;Demonstration;Verbal cues   Comprehension Verbalized understanding;Returned demonstration          SLP Short Term Goals - 05/31/17 1604      SLP SHORT TERM GOAL #1   Title pt will generate 8 items in a simple category with compensations allowed   Status Not Met     SLP SHORT TERM GOAL #2   Title pt will provide sentence level responses using language compensations 80% success   Status Not Met     SLP SHORT TERM GOAL #3   Title pt will demo understanding of 5 minutes mod complex conversation, by  providing appropriate responses in conversation 95% over three sessions   Status Not Met     SLP SHORT TERM GOAL #4   Title pt will demo appropriate breath support 95% for improvement in voice volume at word level   Status Deferred  due to work with language skills          SLP Long Term Goals - 05/31/17 [place] #1   Title pt will demo appropriate breath support for adequate speech volume in 10 minutes simple conversation    Time 5   Period Weeks   Status On-going     SLP LONG TERM GOAL #2   Title pt will demo understanding of 8 minutes min-mod complex conversation with request for repeats PRN   Time 5   Period Weeks   Status Revised     SLP LONG TERM GOAL #3   Title Pt will complete mod complex/complex naming tasks with occasional min A   Time 5   Period Weeks   Status On-going     SLP LONG TERM GOAL #4   Title pt will produce 10 minutes simple conversation using compensations for anomia over three sessions   Time 5   Period Weeks   Status Revised          Plan - 05/31/17 1603    Clinical Impression Statement Pt presents today with cont'd expressive aphasia, and some receptive aphasia. Pt continues to ask husband for assistance whether in conversation or with specific speech tasks. Pt's husband used binder for pt's homework this week. Pt will cont to receive skilled ST to address aphasia, dysarthria (volume), and possibly memory should OT not address.    Speech Therapy Frequency 2x / week   Duration --  8 weeks   Treatment/Interventions Language facilitation;Internal/external aids;Compensatory techniques;SLP instruction and feedback;Multimodal communcation approach;Cognitive reorganization;Functional tasks;Cueing hierarchy;Compensatory strategies;Patient/family education   Potential to Achieve Goals Fair   Potential Considerations Co-morbidities;Severity of impairments;Ability to learn/carryover information;Previous level of function   Consulted  and Agree with Plan of Care Patient;Family member/caregiver   Family Member Consulted husband      Patient will benefit from skilled therapeutic intervention in order to improve the following deficits and impairments:   Aphasia  Dysarthria and anarthria  Cognitive communication deficit    Problem List Patient Active Problem List   Diagnosis Date Noted  . Multiple sclerosis ([place]) 05/12/2017  . CAD (coronary artery disease) 04/12/2017  . Hypertension 04/11/2017  . Right hemiparesis ([place])   . Aspiration pneumonia of right lung ([place])   . Leukocytosis   . Hypokalemia   . Labile blood pressure   . Abnormality of gait as late effect of stroke 02/23/2017  . Dysarthria as late effect of stroke 02/23/2017  . Dysphagia as late effect of stroke 02/23/2017  .  Acute ischemic left middle cerebral artery (MCA) stroke ([place]) 02/22/2017  . Paroxysmal A-fib ([place]) 02/17/2017  . Atrial fibrillation with RVR ([place]) 02/17/2017  . HLD (hyperlipidemia) 02/17/2017  . Acute hypoxemic respiratory failure ([place])   . Stroke (cerebrum) ([place]) - Acute MCA infarct due to left CCA/ICA and MCA occlusion, s/p mechanical thrombectomy and left ICA stenting in the setting of newly diagnosed afib 02/14/2017    [facility] ,MS, CCC-SLP  05/31/2017, 4:08 PM  [facility] [address] [place], [place], [address] Phone: [phone]   Fax:  [phone]   Name: Kaitlin [name] MRN: [id number] Date of Birth: 30-Jan-1946

## 2017-05-31 NOTE — Therapy (Signed)
[facility] [address] [place], [place], [address] Phone: [phone]   Fax:  [phone]  Occupational Therapy Treatment  Patient Details  Name: Kaitlin [name] MRN: [id number] Date of Birth: 25-Aug-1945 Referring Provider: Dr. Wynn Banker  Encounter Date: 05/31/2017      OT End of Session - 05/31/17 1509    Visit Number 8   Number of Visits 17   Date for OT Re-Evaluation 07/04/17   Authorization Type UHC MCR - G code needed   OT Start Time 1316   OT Stop Time 1358   OT Time Calculation (min) 42 min   Activity Tolerance Patient tolerated treatment well      Past Medical History:  Diagnosis Date  . Abnormality of gait as late effect of stroke 02/23/2017  . Acute hypoxemic respiratory failure (HCC)   . Atrial fibrillation with RVR (HCC) 02/17/2017  . CAD S/P percutaneous coronary angioplasty 2001  . CVA (cerebral vascular accident) (HCC) 01/2017  . Dysphagia as late effect of stroke 02/23/2017  . HCVD (hypertensive cardiovascular disease) 01/2017   normal LVF with moderate LVH, grade 1 DD  . High cholesterol   . Hypokalemia   . Labile blood pressure   . Leukocytosis   . Multiple sclerosis (HCC)   . PAF (paroxysmal atrial fibrillation) (HCC) 01/2017  . Right hemiparesis [facility])     Past Surgical History:  Procedure Laterality Date  . CORONARY ANGIOPLASTY WITH STENT PLACEMENT  11/1999   RCA DES-[facility]  . IR ANGIO INTRA EXTRACRAN SEL COM CAROTID INNOMINATE UNI R MOD SED  02/15/2017  . IR ANGIO VERTEBRAL SEL SUBCLAVIAN INNOMINATE UNI R MOD SED  02/15/2017  . IR INTRAVSC STENT CERV CAROTID W/O EMB-PROT MOD SED INC ANGIO  02/15/2017  . IR PERCUTANEOUS ART THROMBECTOMY/INFUSION INTRACRANIAL INC DIAG ANGIO  02/15/2017  . IR RADIOLOGIST EVAL & MGMT  05/10/2017  . NECK SURGERY    . RADIOLOGY WITH ANESTHESIA N/A 02/14/2017   Procedure: RADIOLOGY WITH ANESTHESIA;  Surgeon: Radiologist, Medication, MD;  Location: [facility] OR;   Service: Radiology;  Laterality: N/A;  . WRIST SURGERY      There were no vitals filed for this visit.      Subjective Assessment - 05/31/17 1321    Subjective  Well I need a pan (when asked what she needed to make eggs)   Patient is accompained by: Family member  husband   Pertinent History CVA 02/14/17 PMH: MS for approx 20 years (but no exacerbation for 10 years), A-fib, CAD w/ stent in 2000, white matter dz   Patient Stated Goals to increase balance   Currently in Pain? No/denies                      OT Treatments/Exercises (OP) - 05/31/17 0001      ADLs   Cooking Addressed cooking at ambulatory level without a device.  Pt required close supevision for balance however did not actually have LOB.  As pt progressed through task RLE fatigued as characterized by decreased knee control - pt was unaware due to R inattention.  Also focused on functional use of RUE as non dominant (pt is left handed), sequencing, organization,simple problem solving and safety all in context of familiar, basic functional task. Pt needed only min vc's.  Apraxia impacts functional mobility (i.e. turns 360* when she could just turn 45* for task) however pt was able to problem solve despite this.  Pt's husband cleared to do simple cooking  with pt with very close supervision when not using walker (pt is actually far more unsafe with walker than without with supervision).  Pt demonstrated good automatic functional use of RUE.  Pt also independently turned off stove.  Pt needed min vc's for organization to clearn up after task including loading dishwasher.      Fine Motor Coordination   Other Fine Motor Exercises Addressed fine motor and motor planning with bilateral task using smaller items, eye hand coordination also incorporated.                 OT Education - 05/30/17 1042    Education provided Yes   Education Details Cane HEP    Person(s) Educated Patient;Spouse   Methods  Explanation;Demonstration;Handout   Comprehension Verbalized understanding;Returned demonstration          OT Short Term Goals - 05/31/17 1325      OT SHORT TERM GOAL #1   Title Pt/family independent with coordination and putty HEP - 06/04/2017   Time 4   Period Weeks   Status Achieved     OT SHORT TERM GOAL #2   Title Pt/family independent with HEP for RUE shoulder ROM   Time 4   Period Weeks   Status Achieved     OT SHORT TERM GOAL #3   Title Pt/family to verbalize understanding with safety with shower transfers and bathing with DME/AE prn   Time 4   Period Weeks   Status Achieved     OT SHORT TERM GOAL #4   Title Pt/family to verbalize understanding with memory strategies for sequencing with donning shirt/pants, sequencing and safety with use of walker/rollator, and for medication management   Time 4   Period Weeks   Status Achieved     OT SHORT TERM GOAL #5   Title Pt to improve grip strength by 10 lbs Rt hand    Baseline 22 lbs   Time 4   Period Weeks   Status Achieved  05/31/2017 R= 35     OT SHORT TERM GOAL #6   Title Pt to improve coordination Rt hand as evidenced by performing 9 hole peg test to 100 sec. or less   Baseline 2 min. 6 sec   Time 4   Period Weeks   Status Achieved  54.25 sec           OT Long Term Goals - 05/31/17 1325      OT LONG TERM GOAL #1   Title Pt to retrieve light weight object from high shelf RUE requiring 120 degrees shoulder flexion w/o drops 5/5 trials -07/01/2017   Time 8   Period Weeks   Status On-going     OT LONG TERM GOAL #2   Title Pt to perform simple snack prep and light IADLS (washing dishes, laundry tasks) demo safety with walker w/o LOB   Time 8   Period Weeks   Status On-going     OT LONG TERM GOAL #3   Title Pt to implement memory strategies into ADLS including sequencing of donning clothes, walker negotiation, and medication management with supervision and no more than min cues   Time 8   Period  Weeks   Status On-going     OT LONG TERM GOAL #4   Title Pt to improve coordination Rt hand as evidenced by performing 9 hole peg test in 45 sec. or less   Baseline 2 min. 6 sec   Time 8   Period Weeks  Status Revised     OT LONG TERM GOAL #5   Title Pt to tie shoes and style hair mod I level with A/E prn   Time 8   Period Weeks   Status On-going               Plan - 05/31/17 1508    Clinical Impression Statement Pt progressing toward goals. Pt with improving use of R non dominant hand as well as balance.    Current Impairments/barriers affecting progress: severity of deficits including aphasia, cognition   OT Frequency 2x / week   OT Duration 8 weeks   OT Treatment/Interventions Self-care/ADL training;DME and/or AE instruction;Patient/family education;Therapeutic exercises;Therapeutic activities;Functional Mobility Training;Passive range of motion;Cognitive remediation/compensation;Visual/perceptual remediation/compensation;Manual Therapy;Moist Heat   Plan balance without walker through functional tasks, continue use of Rt hand with focus on apraxia, coordination and strength as well as overhead reach, and incorporate cognition as able   Consulted and Agree with Plan of Care Patient;Family member/caregiver   Family Member Consulted husband-[name]      Patient will benefit from skilled therapeutic intervention in order to improve the following deficits and impairments:  Decreased coordination, Decreased range of motion, Impaired flexibility, Impaired sensation, Decreased safety awareness, Decreased knowledge of precautions, Impaired tone, Impaired UE functional use, Decreased knowledge of use of DME, Decreased mobility, Decreased balance, Decreased cognition, Decreased strength, Impaired perceived functional ability, Impaired vision/preception  Visit Diagnosis: Hemiplegia and hemiparesis following cerebral infarction affecting right non-dominant side (HCC)  Other lack of  coordination  Muscle weakness (generalized)  Other symptoms and signs involving cognitive functions following cerebral infarction  Unsteadiness on feet  Apraxia    Problem List Patient Active Problem List   Diagnosis Date Noted  . Multiple sclerosis (HCC) 05/12/2017  . CAD (coronary artery disease) 04/12/2017  . Hypertension 04/11/2017  . Right hemiparesis (HCC)   . Aspiration pneumonia of right lung (HCC)   . Leukocytosis   . Hypokalemia   . Labile blood pressure   . Abnormality of gait as late effect of stroke 02/23/2017  . Dysarthria as late effect of stroke 02/23/2017  . Dysphagia as late effect of stroke 02/23/2017  . Acute ischemic left middle cerebral artery (MCA) stroke (HCC) 02/22/2017  . Paroxysmal A-fib (HCC) 02/17/2017  . Atrial fibrillation with RVR (HCC) 02/17/2017  . HLD (hyperlipidemia) 02/17/2017  . Acute hypoxemic respiratory failure (HCC)   . Stroke (cerebrum) (HCC) - Acute MCA infarct due to left CCA/ICA and MCA occlusion, s/p mechanical thrombectomy and left ICA stenting in the setting of newly diagnosed afib 02/14/2017    Norton Pastel[place]ul[place], Karen Halliday, OTR/L 05/31/2017, 3:12 PM   [facility] [address] [place], [place], [address] Phone: [phone]   Fax:  [phone]  Name: Kaitlin [name] MRN: [id number] Date of Birth: July 26, 1946

## 2017-05-31 NOTE — Therapy (Signed)
[facility] [address] [place], [place], [address] Phone: [phone]   Fax:  [phone]  Physical Therapy Treatment  Patient Details  Name: Kaitlin [name] MRN: [id number] Date of Birth: [birth year] Referring Provider: Alysia Penna MD  Encounter Date: 05/31/2017      PT End of Session - 05/31/17 1403    Visit Number 7   Number of Visits 17   Date for PT Re-Evaluation 07/04/17   Authorization Type UHC MCR   Authorization Time Period 05/05/17  to  07/04/17   PT Start Time 1400   PT Stop Time 1444   PT Time Calculation (min) 44 min   Equipment Utilized During Treatment Gait belt   Activity Tolerance Patient tolerated treatment well   Behavior During Therapy [facility] for tasks assessed/performed      Past Medical History:  Diagnosis Date  . Abnormality of gait as late effect of stroke 02/23/2017  . Acute hypoxemic respiratory failure ([place])   . Atrial fibrillation with RVR ([place]) 02/17/2017  . CAD S/P percutaneous coronary angioplasty 2001  . CVA (cerebral vascular accident) ([place]) 01/2017  . Dysphagia as late effect of stroke 02/23/2017  . HCVD (hypertensive cardiovascular disease) 01/2017   normal LVF with moderate LVH, grade 1 DD  . High cholesterol   . Hypokalemia   . Labile blood pressure   . Leukocytosis   . Multiple sclerosis ([place])   . PAF (paroxysmal atrial fibrillation) ([place]) 01/2017  . Right hemiparesis [facility])     Past Surgical History:  Procedure Laterality Date  . CORONARY ANGIOPLASTY WITH STENT PLACEMENT  11/1999   [facility]CA DES[facility]  . IR ANGIO INTRA EXTRACRAN SEL COM CAROTID INNOMINATE UNI R MOD SED  02/15/2017  . IR ANGIO VERTEBRAL SEL SUBCLAVIAN INNOMINATE UNI R MOD SED  02/15/2017  . IR INTRAVSC STENT CERV CAROTID W/O EMB-PROT MOD SED INC ANGIO  02/15/2017  . IR PERCUTANEOUS ART THROMBECTOMY/INFUSION INTRACRANIAL INC DIAG ANGIO  02/15/2017  . IR RADIOLOGIST EVAL & MGMT  05/10/2017  . NECK SURGERY     . RADIOLOGY WITH ANESTHESIA N/A 02/14/2017   Procedure: RADIOLOGY WITH ANESTHESIA;  Surgeon: Radiologist, Medication, MD;  Location: [facility];  Service: Radiology;  Laterality: N/A;  . WRIST SURGERY      There were no vitals filed for this visit.       Subjective Assessment - 05/31/17 1402    Subjective No new complaints, no falls or pain to report   Patient is accompained by: Family member   Patient Stated Goals "Standing up straight; getting my self-control" (balance)   Currently in Pain? No/denies               [facility] PT Assessment - 05/31/17 1630      Functional Gait  Assessment   Gait assessed  Yes   Gait Level Surface Walks 20 ft, slow speed, abnormal gait pattern, evidence for imbalance or deviates 10-15 in outside of the 12 in walkway width. Requires more than 7 sec to ambulate 20 ft.   Change in Gait Speed Able to smoothly change walking speed without loss of balance or gait deviation. Deviate no more than 6 in outside of the 12 in walkway width.   Gait with Horizontal Head Turns Performs head turns smoothly with no change in gait. Deviates no more than 6 in outside 12 in walkway width   Gait with Vertical Head Turns Performs task with slight change in gait velocity (eg, minor disruption to smooth gait  path), deviates 6 - 10 in outside 12 in walkway width or uses assistive device   Gait and Pivot Turn Pivot turns safely in greater than 3 sec and stops with no loss of balance, or pivot turns safely within 3 sec and stops with mild imbalance, requires small steps to catch balance.   Step Over Obstacle Is able to step over one shoe box (4.5 in total height) without changing gait speed. No evidence of imbalance.   Gait with Narrow Base of Support Ambulates less than 4 steps heel to toe or cannot perform without assistance.   Gait with Eyes Closed Walks 20 ft, slow speed, abnormal gait pattern, evidence for imbalance, deviates 10-15 in outside 12 in walkway width. Requires more than  9 sec to ambulate 20 ft.   Ambulating Backwards Walks 20 ft, slow speed, abnormal gait pattern, evidence for imbalance, deviates 10-15 in outside 12 in walkway width.   Steps Two feet to a stair, must use rail.   Total Score 16             [facility] Adult PT Treatment/Exercise - 05/31/17 0946      Dynamic Standing Balance   Reaching for objects comments: Pt standing on red mat, in front of counter for UE support as needed, bending down and reaching to pick up yoga blocks and set then on the counter, then side stepping to the next one. The pt then side stepped in the other direction to replace the yoga blocks from the counter back down to the mat x10 reps. The pt required min guard with this task and demonstrated safe bending and standing, pt required cues to step further back from counter bending down to avoid hitting her head on the counter, pt was also slow moving with each bend.    Reaching across midline comments: Pt standing with chair in front for UE support as needed. Pt reaching forward, alt. UE's focusing on bending at the hips x10 reps. Pt min guard, pt required multiple cues to bend at hips and reach as far as she could safetly.    Alternating foot traps comments: Pt standing on red mat at counter for UE support as needed, Pt alt. tapping with BLE's on yoga blocks then side stepping to the next block down/back x10reps. Pt required min assist and multiple cues to side step all the way to the block, tap with alt. feet, and only use single UE support.      Neuro Re-ed    Neuro Re-ed Details  Pt standing in front of elevated mat/behind chair for UE support as needed. Pt surrounded by 5 different colored circles tapping the correct color with the correct LE called out by therapist. This task was done to assess the pts balance and cognition. The pt had trouble following commands after several instructions were given, pt required max cues during this task and showed some instances of slight  imbalance when crossing LE to opposite side. pt was min/mod assist with this task.               PT Short Term Goals - 05/31/17 1531      PT SHORT TERM GOAL #1   Title Patient (with supervision to min-guard assistance of her spouse) will perform basic HEP for balance and strengthening. (Target STGs 06/04/17)   Baseline 05/30/17: goals achieved   Time 4   Period Weeks   Status Achieved     PT SHORT TERM GOAL #2   Title  Patient will complete FGA with STG and LTG set as appropriate.   Baseline 10/10 FGA 9/30; 10/16 goal set   Time 1   Period Weeks   Status Achieved     PT SHORT TERM GOAL #3   Title Patient will improve TUG with or without a device to <15 seconds to demonstrate a lesser fall risk.    Baseline rollator 18.25; no device 17.81 sec. 05/30/17: No AD 18.10/ RW 26.59   Time 4   Period Weeks   Status Not Met     PT SHORT TERM GOAL #4   Title Patient will improve her gait velocity to 2.62 ft/sec to indicate lesser fall risk with community ambulation.    Baseline 05/30/17: 2.04 ft/sec without AD 1.45f/sec with AD   Time 4   Period Weeks   Status Not Met     PT SHORT TERM GOAL #5   Title Patient will improve FGA score to >=14/30 to demonstrate progress towards lesser fall risk   Baseline 05/31/17: Pt improved score to 16/30   Time 3   Period Weeks   Status Achieved           PT Long Term Goals - 05/31/17 1536      PT LONG TERM GOAL #1   Title Patient and/or husband will be able to verbalize signs and symptoms of CVA and appropriate action to take. (Target LTGs: 07/04/17)   Time 8   Period Weeks   Status New     PT LONG TERM GOAL #2   Title Patient (with supervision of husband) will perform updated HEP   Time 8   Period Weeks   Status New     PT LONG TERM GOAL #3   Title Patient will ambulate >=2.79 ft/sec (age based norm) with LRAD or no device on level surface.   Time 8   Period Weeks   Status New     PT LONG TERM GOAL #4   Title Patient will  ambulate >= 1000 ft on outdoor surfaces (level and unlevel, grass, gravel, mulch) with LRAD and supervision.    Time 8   Period Weeks   Status New     PT LONG TERM GOAL #5   Title Patient will perform TUG <13.5 seconds to demonstrate lesser fall risk.   Time 8   Period Weeks   Status New     PT LONG TERM GOAL #6   Title Patient will improve FGA score by 4 points above her score assessed at STGs (TBA).   Baseline 10/16 goal set per STG set on eval   Time 7   Period Weeks   Status New               Plan - 05/31/17 1536    Clinical Impression Statement Pt tolerated treatment well with no limitations due to pain or fatigue. Todays session focused on completing the remaining short term goal, balance training, and cognitive challenging tasks. Pt min guard/assist with activities, pt presents with cognitive issues during tasks while requiring multiple/repetitive cues for instruction. Pt should continue to benefit from skilled PT sessions to work to goals.    Rehab Potential Good   Clinical Impairments Affecting Rehab Potential impulsivity and decr awareness of deficits   PT Frequency 2x / week   PT Duration 8 weeks   PT Treatment/Interventions ADLs/Self Care Home Management;DME Instruction;Gait training;Stair training;Functional mobility training;Orthotic Fit/Training;Patient/family education;Cognitive remediation;Neuromuscular re-education;Balance training;Therapeutic exercise;Therapeutic activities   PT Next Visit Plan Continue  to work on wt-shifting tasks with single UE support, and LE strengthening on compliant surfaces, and cognitive challenging tasks.    Consulted and Agree with Plan of Care Patient;Family member/caregiver   Family Member Consulted spouse, Kaitlin [name]      Patient will benefit from skilled therapeutic intervention in order to improve the following deficits and impairments:  Abnormal gait, Decreased balance, Decreased cognition, Decreased knowledge of use of DME,  Decreased mobility, Decreased safety awareness, Decreased strength, Difficulty walking, Impaired perceived functional ability, Impaired sensation, Impaired UE functional use  Visit Diagnosis: Unsteadiness on feet  Other abnormalities of gait and mobility  Muscle weakness (generalized)     Problem List Patient Active Problem List   Diagnosis Date Noted  . Multiple sclerosis ([place]) 05/12/2017  . CAD (coronary artery disease) 04/12/2017  . Hypertension 04/11/2017  . Right hemiparesis ([place])   . Aspiration pneumonia of right lung ([place])   . Leukocytosis   . Hypokalemia   . Labile blood pressure   . Abnormality of gait as late effect of stroke 02/23/2017  . Dysarthria as late effect of stroke 02/23/2017  . Dysphagia as late effect of stroke 02/23/2017  . Acute ischemic left middle cerebral artery (MCA) stroke ([place]) 02/22/2017  . Paroxysmal A-fib ([place]) 02/17/2017  . Atrial fibrillation with RVR ([place]) 02/17/2017  . HLD (hyperlipidemia) 02/17/2017  . Acute hypoxemic respiratory failure ([place])   . Stroke (cerebrum) ([place]) - Acute MCA infarct due to left CCA/ICA and MCA occlusion, s/p mechanical thrombectomy and left ICA stenting in the setting of newly diagnosed afib 02/14/2017   Kaitlin [name], SPTA  Kaitlin [name] 06/01/2017, 9:59 AM  [facility] [address] [place], [place], [address] Phone: [phone]   Fax:  [phone]  Name: Kaitlin [name] MRN: [id number] Date of Birth: [birth year]  This note has been reviewed and edited by supervising CI. Added back in the FGA portion and completed the subjective portion of the note.  Kaitlin [name], PTA, [facility] [address] [place], [place] [id number] [phone] 06/01/17, 10:28 AM

## 2017-06-02 ENCOUNTER — Encounter: Payer: Self-pay | Admitting: Physical Medicine & Rehabilitation

## 2017-06-02 ENCOUNTER — Other Ambulatory Visit: Payer: Self-pay

## 2017-06-02 ENCOUNTER — Encounter: Payer: Medicare Other | Attending: Physical Medicine & Rehabilitation

## 2017-06-02 ENCOUNTER — Ambulatory Visit (HOSPITAL_BASED_OUTPATIENT_CLINIC_OR_DEPARTMENT_OTHER): Payer: Medicare Other | Admitting: Physical Medicine & Rehabilitation

## 2017-06-02 VITALS — BP 149/74 | HR 55

## 2017-06-02 DIAGNOSIS — I69319 Unspecified symptoms and signs involving cognitive functions following cerebral infarction: Secondary | ICD-10-CM | POA: Diagnosis not present

## 2017-06-02 DIAGNOSIS — I63512 Cerebral infarction due to unspecified occlusion or stenosis of left middle cerebral artery: Secondary | ICD-10-CM | POA: Diagnosis present

## 2017-06-02 DIAGNOSIS — I69351 Hemiplegia and hemiparesis following cerebral infarction affecting right dominant side: Secondary | ICD-10-CM | POA: Diagnosis not present

## 2017-06-02 DIAGNOSIS — I69398 Other sequelae of cerebral infarction: Secondary | ICD-10-CM

## 2017-06-02 DIAGNOSIS — R269 Unspecified abnormalities of gait and mobility: Secondary | ICD-10-CM | POA: Diagnosis not present

## 2017-06-02 DIAGNOSIS — I69322 Dysarthria following cerebral infarction: Secondary | ICD-10-CM | POA: Insufficient documentation

## 2017-06-02 NOTE — Progress Notes (Signed)
Subjective:    Patient ID: Kaitlin ScrapeLinda Bazen, female    DOB: [birth year], 71 y.o.   MRN: [id number]  HPI Fall on 10/17 but no significant injury Continues with OP speech therapy, OT, PT Amb with walker in home  But without device in PT Doing some laundry Scrambled an egg in OT Needs assist for transfer to shower chair but bathes self   Pain Inventory Average Pain 0 Pain Right Now 0 My pain is na  In the last 24 hours, has pain interfered with the following? General activity 0 Relation with others 0 Enjoyment of life 0 What TIME of day is your pain at its worst? na Sleep (in general) Good  Pain is worse with: na Pain improves with: na Relief from Meds: na  Mobility walk without assistance walk with assistance use a walker ability to climb steps?  yes do you drive?  no  Function retired I need assistance with the following:  meal prep, household duties and shopping  Neuro/Psych weakness trouble walking confusion  Prior Studies Any changes since last visit?  no  Physicians involved in your care Any changes since last visit?  no   Family History  Problem Relation Age of Onset  . Heart disease Father   . Cancer Father   . Multiple sclerosis Sister   . Cancer Mother    Social History   Social History  . Marital status: Married    Spouse name: N/A  . Number of children: N/A  . Years of education: N/A   Social History Main Topics  . Smoking status: Never Smoker  . Smokeless tobacco: Never Used  . Alcohol use 1.8 oz/week    3 Glasses of wine per week     Comment: wine  . Drug use: No  . Sexual activity: Not on file   Other Topics Concern  . Not on file   Social History Narrative  . No narrative on file   Past Surgical History:  Procedure Laterality Date  . CORONARY ANGIOPLASTY WITH STENT PLACEMENT  11/1999   [facility]  . IR ANGIO INTRA EXTRACRAN SEL COM CAROTID INNOMINATE UNI R MOD SED  02/15/2017  . IR ANGIO VERTEBRAL SEL SUBCLAVIAN  INNOMINATE UNI R MOD SED  02/15/2017  . IR INTRAVSC STENT CERV CAROTID W/O EMB-PROT MOD SED INC ANGIO  02/15/2017  . IR PERCUTANEOUS ART THROMBECTOMY/INFUSION INTRACRANIAL INC DIAG ANGIO  02/15/2017  . IR RADIOLOGIST EVAL & MGMT  05/10/2017  . NECK SURGERY    . RADIOLOGY WITH ANESTHESIA N/A 02/14/2017   Procedure: RADIOLOGY WITH ANESTHESIA;  Surgeon: Radiologist, Medication, MD;  Location: [facility] OR;  Service: Radiology;  Laterality: N/A;  . WRIST SURGERY     Past Medical History:  Diagnosis Date  . Abnormality of gait as late effect of stroke 02/23/2017  . Acute hypoxemic respiratory failure (HCC)   . Atrial fibrillation with RVR (HCC) 02/17/2017  . CAD S/P percutaneous coronary angioplasty 2001  . CVA (cerebral vascular accident) (HCC) 01/2017  . Dysphagia as late effect of stroke 02/23/2017  . HCVD (hypertensive cardiovascular disease) 01/2017   normal LVF with moderate LVH, grade 1 DD  . High cholesterol   . Hypokalemia   . Labile blood pressure   . Leukocytosis   . Multiple sclerosis (HCC)   . PAF (paroxysmal atrial fibrillation) (HCC) 01/2017  . Right hemiparesis (HCC)    There were no vitals taken for this visit.  Opioid Risk Score:   Fall Risk Score:  `  1  Depression screen PHQ 2/9  Depression screen PHQ 2/9 03/31/2017  Decreased Interest 0  Down, Depressed, Hopeless 0  PHQ - 2 Score 0  Altered sleeping 0  Tired, decreased energy 0  Change in appetite 0  Feeling bad or failure about yourself  0  Trouble concentrating 0  Moving slowly or fidgety/restless 0  Suicidal thoughts 0  PHQ-9 Score 0  Difficult doing work/chores Not difficult at all     Review of Systems  Constitutional: Negative.   HENT: Negative.   Eyes: Negative.   Respiratory: Negative.   Cardiovascular: Negative.   Gastrointestinal: Negative.   Endocrine: Negative.   Genitourinary: Negative.   Musculoskeletal: Negative.   Skin: Negative.   Allergic/Immunologic: Negative.   Neurological: Negative.     Hematological: Bruises/bleeds easily.  Psychiatric/Behavioral: Negative.   All other systems reviewed and are negative.      Objective:   Physical Exam  Constitutional: She is oriented to person, place, and time. She appears well-developed and well-nourished. No distress.  HENT:  Head: Normocephalic and atraumatic.  Eyes: Pupils are equal, round, and reactive to light. Conjunctivae are normal.  Neurological: She is alert and oriented to person, place, and time.  Skin: She is not diaphoretic.  Psychiatric: She has a normal mood and affect. Thought content normal. Her speech is delayed. She is slowed. She exhibits abnormal recent memory and abnormal remote memory.  Poor awareness of deficits  Nursing note and vitals reviewed.   Oriented to person, but not time or place Intact naming Motor strength is 4/5 in the right deltoid, bicep, tricep, grip, hip flexor, knee extensor, ankle dorsiflexor 5/5 in the left deltoid, bicep, tricep, grip, hip flexor, knee extensor, ankle dorsiflexor Gait slightly wide base of support, needs supervision for ambulation no evidence of toe drag or knee instability, no assistive device       Assessment & Plan:  #1 left MCA distribution embolic infarct with residual right hemiparesis and aphasia.  Also has gait disorder.  Overall she is made excellent improvement.  She remains a fall risk because of poor awareness as well as some decreased balance as well  Continue outpatient therapy, PT OT speech Physical medicine and rehabilitation follow-up in 6 weeks

## 2017-06-03 ENCOUNTER — Other Ambulatory Visit: Payer: Self-pay

## 2017-06-04 ENCOUNTER — Observation Stay (HOSPITAL_COMMUNITY): Payer: Medicare Other

## 2017-06-04 ENCOUNTER — Emergency Department (HOSPITAL_COMMUNITY): Payer: Medicare Other

## 2017-06-04 ENCOUNTER — Encounter (HOSPITAL_COMMUNITY): Payer: Self-pay | Admitting: Emergency Medicine

## 2017-06-04 ENCOUNTER — Observation Stay (HOSPITAL_COMMUNITY)
Admission: EM | Admit: 2017-06-04 | Discharge: 2017-06-04 | Disposition: A | Discharge status: Home / Self Care | Payer: Medicare Other | Attending: Neurological Surgery | Admitting: Neurological Surgery

## 2017-06-04 DIAGNOSIS — E7800 Pure hypercholesterolemia, unspecified: Secondary | ICD-10-CM | POA: Diagnosis not present

## 2017-06-04 DIAGNOSIS — M8580 Other specified disorders of bone density and structure, unspecified site: Secondary | ICD-10-CM | POA: Insufficient documentation

## 2017-06-04 DIAGNOSIS — W010XXA Fall on same level from slipping, tripping and stumbling without subsequent striking against object, initial encounter: Secondary | ICD-10-CM | POA: Insufficient documentation

## 2017-06-04 DIAGNOSIS — E876 Hypokalemia: Secondary | ICD-10-CM | POA: Insufficient documentation

## 2017-06-04 DIAGNOSIS — I119 Hypertensive heart disease without heart failure: Secondary | ICD-10-CM | POA: Diagnosis not present

## 2017-06-04 DIAGNOSIS — Z7902 Long term (current) use of antithrombotics/antiplatelets: Secondary | ICD-10-CM | POA: Diagnosis not present

## 2017-06-04 DIAGNOSIS — S0011XA Contusion of right eyelid and periocular area, initial encounter: Secondary | ICD-10-CM | POA: Diagnosis not present

## 2017-06-04 DIAGNOSIS — M4050 Lordosis, unspecified, site unspecified: Secondary | ICD-10-CM | POA: Insufficient documentation

## 2017-06-04 DIAGNOSIS — Z955 Presence of coronary angioplasty implant and graft: Secondary | ICD-10-CM | POA: Insufficient documentation

## 2017-06-04 DIAGNOSIS — Z8673 Personal history of transient ischemic attack (TIA), and cerebral infarction without residual deficits: Secondary | ICD-10-CM | POA: Diagnosis not present

## 2017-06-04 DIAGNOSIS — Y92002 Bathroom of unspecified non-institutional (private) residence single-family (private) house as the place of occurrence of the external cause: Secondary | ICD-10-CM | POA: Insufficient documentation

## 2017-06-04 DIAGNOSIS — I480 Paroxysmal atrial fibrillation: Secondary | ICD-10-CM | POA: Insufficient documentation

## 2017-06-04 DIAGNOSIS — G35 Multiple sclerosis: Secondary | ICD-10-CM | POA: Diagnosis not present

## 2017-06-04 DIAGNOSIS — J342 Deviated nasal septum: Secondary | ICD-10-CM | POA: Insufficient documentation

## 2017-06-04 DIAGNOSIS — Z79899 Other long term (current) drug therapy: Secondary | ICD-10-CM | POA: Diagnosis not present

## 2017-06-04 DIAGNOSIS — S066X0A Traumatic subarachnoid hemorrhage without loss of consciousness, initial encounter: Principal | ICD-10-CM | POA: Insufficient documentation

## 2017-06-04 DIAGNOSIS — J90 Pleural effusion, not elsewhere classified: Secondary | ICD-10-CM | POA: Insufficient documentation

## 2017-06-04 DIAGNOSIS — Z880 Allergy status to penicillin: Secondary | ICD-10-CM | POA: Diagnosis not present

## 2017-06-04 DIAGNOSIS — I2510 Atherosclerotic heart disease of native coronary artery without angina pectoris: Secondary | ICD-10-CM | POA: Diagnosis not present

## 2017-06-04 DIAGNOSIS — I609 Nontraumatic subarachnoid hemorrhage, unspecified: Secondary | ICD-10-CM

## 2017-06-04 LAB — CBC WITH DIFFERENTIAL/PLATELET
BASOS ABS: 0 10*3/uL (ref 0.0–0.1)
Basophils Relative: 0 %
EOS ABS: 0.1 10*3/uL (ref 0.0–0.7)
Eosinophils Relative: 1 %
HCT: 32.2 % — ABNORMAL LOW (ref 36.0–46.0)
Hemoglobin: 10.4 g/dL — ABNORMAL LOW (ref 12.0–15.0)
LYMPHS ABS: 1 10*3/uL (ref 0.7–4.0)
LYMPHS PCT: 13 %
MCH: 30 pg (ref 26.0–34.0)
MCHC: 32.3 g/dL (ref 30.0–36.0)
MCV: 92.8 fL (ref 78.0–100.0)
MONOS PCT: 8 %
Monocytes Absolute: 0.6 10*3/uL (ref 0.1–1.0)
NEUTROS PCT: 78 %
Neutro Abs: 5.7 10*3/uL (ref 1.7–7.7)
PLATELETS: 235 10*3/uL (ref 150–400)
RBC: 3.47 MIL/uL — AB (ref 3.87–5.11)
RDW: 15.3 % (ref 11.5–15.5)
WBC: 7.4 10*3/uL (ref 4.0–10.5)

## 2017-06-04 LAB — BASIC METABOLIC PANEL
ANION GAP: 9 (ref 5–15)
BUN: 16 mg/dL (ref 6–20)
CO2: 24 mmol/L (ref 22–32)
Calcium: 8.7 mg/dL — ABNORMAL LOW (ref 8.9–10.3)
Chloride: 102 mmol/L (ref 101–111)
Creatinine, Ser: 0.71 mg/dL (ref 0.44–1.00)
Glucose, Bld: 113 mg/dL — ABNORMAL HIGH (ref 65–99)
POTASSIUM: 4.1 mmol/L (ref 3.5–5.1)
SODIUM: 135 mmol/L (ref 135–145)

## 2017-06-04 LAB — PROTIME-INR
INR: 1.61
PROTHROMBIN TIME: 19 s — AB (ref 11.4–15.2)

## 2017-06-04 LAB — APTT: APTT: 32 s (ref 24–36)

## 2017-06-04 MED ORDER — METOPROLOL TARTRATE 50 MG PO TABS
50.0000 mg | ORAL_TABLET | Freq: Two times a day (BID) | ORAL | Status: DC
  Administered 2017-06-04: 50 mg via ORAL
  Filled 2017-06-04: qty 1

## 2017-06-04 MED ORDER — ACETAMINOPHEN 650 MG RE SUPP: 650.0000 mg | Freq: Four times a day (QID) | RECTAL | Status: DC | PRN

## 2017-06-04 MED ORDER — SODIUM CHLORIDE 0.9% FLUSH
3.0000 mL | INTRAVENOUS | Status: DC | PRN
Start: 2017-06-04 — End: 2017-06-04

## 2017-06-04 MED ORDER — FLUOXETINE HCL 10 MG PO CAPS
10.0000 mg | ORAL_CAPSULE | Freq: Every day | ORAL | Status: DC
  Administered 2017-06-04: 10 mg via ORAL
  Filled 2017-06-04: qty 1

## 2017-06-04 MED ORDER — SODIUM CHLORIDE 0.9 % IV SOLN: 250.0000 mL | INTRAVENOUS | Status: DC | PRN

## 2017-06-04 MED ORDER — LEVETIRACETAM 500 MG PO TABS: 500.0000 mg | ORAL_TABLET | Freq: Two times a day (BID) | ORAL | 0 refills | Status: DC

## 2017-06-04 MED ORDER — ATORVASTATIN CALCIUM 40 MG PO TABS: 40.0000 mg | ORAL_TABLET | Freq: Every day | ORAL | Status: DC

## 2017-06-04 MED ORDER — ONDANSETRON HCL 4 MG/2ML IJ SOLN: 4.0000 mg | Freq: Four times a day (QID) | INTRAMUSCULAR | Status: DC | PRN

## 2017-06-04 MED ORDER — HYDROCODONE-ACETAMINOPHEN 5-325 MG PO TABS: 1.0000 | ORAL_TABLET | ORAL | Status: DC | PRN

## 2017-06-04 MED ORDER — PANTOPRAZOLE SODIUM 40 MG PO TBEC
40.0000 mg | DELAYED_RELEASE_TABLET | Freq: Every day | ORAL | Status: DC
  Administered 2017-06-04: 40 mg via ORAL
  Filled 2017-06-04: qty 1

## 2017-06-04 MED ORDER — MAGNESIUM CITRATE PO SOLN: 1.0000 | Freq: Once | ORAL | Status: DC | PRN

## 2017-06-04 MED ORDER — ZOLPIDEM TARTRATE 5 MG PO TABS: 5.0000 mg | ORAL_TABLET | Freq: Every evening | ORAL | Status: DC | PRN

## 2017-06-04 MED ORDER — ACETAMINOPHEN 325 MG PO TABS: 650.0000 mg | ORAL_TABLET | Freq: Four times a day (QID) | ORAL | Status: DC | PRN

## 2017-06-04 MED ORDER — SODIUM CHLORIDE 0.9 % IV SOLN
INTRAVENOUS | Status: DC
  Administered 2017-06-04: 07:00:00 via INTRAVENOUS

## 2017-06-04 MED ORDER — ONDANSETRON HCL 4 MG PO TABS: 4.0000 mg | ORAL_TABLET | Freq: Four times a day (QID) | ORAL | Status: DC | PRN

## 2017-06-04 MED ORDER — PROSIGHT PO TABS
1.0000 | ORAL_TABLET | Freq: Two times a day (BID) | ORAL | Status: DC
  Administered 2017-06-04: 1 via ORAL
  Filled 2017-06-04: qty 1

## 2017-06-04 MED ORDER — SENNA 8.6 MG PO TABS
1.0000 | ORAL_TABLET | Freq: Two times a day (BID) | ORAL | Status: DC
  Administered 2017-06-04: 8.6 mg via ORAL
  Filled 2017-06-04: qty 1

## 2017-06-04 MED ORDER — SODIUM CHLORIDE 0.9% FLUSH
3.0000 mL | Freq: Two times a day (BID) | INTRAVENOUS | Status: DC
  Administered 2017-06-04: 3 mL via INTRAVENOUS

## 2017-06-04 MED ORDER — HYDROCODONE-ACETAMINOPHEN 5-325 MG PO TABS: 1.0000 | ORAL_TABLET | ORAL | 0 refills | Status: DC | PRN

## 2017-06-04 MED ORDER — VENLAFAXINE HCL ER 75 MG PO CP24
75.0000 mg | ORAL_CAPSULE | Freq: Every day | ORAL | Status: DC
  Administered 2017-06-04: 75 mg via ORAL
  Filled 2017-06-04: qty 1

## 2017-06-04 MED ORDER — CALCIUM CARBONATE-VITAMIN D 500-200 MG-UNIT PO TABS
1.0000 | ORAL_TABLET | Freq: Every day | ORAL | Status: DC
  Administered 2017-06-04: 1 via ORAL
  Filled 2017-06-04: qty 1

## 2017-06-04 MED ORDER — AMIODARONE HCL 200 MG PO TABS
200.0000 mg | ORAL_TABLET | Freq: Every day | ORAL | Status: DC
Start: 2017-06-04 — End: 2017-06-04
  Administered 2017-06-04: 200 mg via ORAL
  Filled 2017-06-04: qty 1

## 2017-06-04 MED ORDER — SODIUM CHLORIDE 0.9 % IV SOLN
500.0000 mg | Freq: Two times a day (BID) | INTRAVENOUS | Status: DC
  Administered 2017-06-04: 500 mg via INTRAVENOUS
  Filled 2017-06-04 (×2): qty 5

## 2017-06-04 NOTE — ED Provider Notes (Signed)
Patient presented to the ER after a fall at home.  Patient reports that she got tangled up in her walker and fell in the bathroom.  Face to face Exam: HEENT - ecchymosis, contusion, swelling around right eye Lungs - CTAB Heart - RRR, no M/R/G Abd - S/NT/ND Neuro - alert, oriented x3  Plan: CT head reveals small subarachnoid hemorrhage and possibly small intraparenchymal contusion.  Will consult neurosurgery for further management.   Gilda Crease, MD 06/04/17 [phone]

## 2017-06-04 NOTE — H&P (Signed)
Chief Complaint   Chief Complaint  Patient presents with  . Fall    HPI   HPI: Kaitlin [name] is a 71 y.o. female who presents to ER after falling at home. Was getting out of bath tub approx 630-7pm using walker and got tangled up. Tripped and hit right side of face on ground. No LOC. Initially, went to bed because she didn't appear to have any injuries but within an hour, her husband noticed significant swelling and bruising to right side of face. She feels well currently. No headache, dizziness, changes in vision, photophobia, nausea, vomiting, motor/sensory deficits.  She is currently on Plavix and Xarelto due to recent CVA. Mild right sided residual weakness from stroke.   Patient Active Problem List   Diagnosis Date Noted  . Subarachnoid hemorrhage (HCC) 06/04/2017  . Multiple sclerosis (HCC) 05/12/2017  . CAD (coronary artery disease) 04/12/2017  . Hypertension 04/11/2017  . Right hemiparesis (HCC)   . Aspiration pneumonia of right lung (HCC)   . Leukocytosis   . Hypokalemia   . Labile blood pressure   . Abnormality of gait as late effect of stroke 02/23/2017  . Dysarthria as late effect of stroke 02/23/2017  . Dysphagia as late effect of stroke 02/23/2017  . Acute ischemic left middle cerebral artery (MCA) stroke (HCC) 02/22/2017  . Paroxysmal A-fib (HCC) 02/17/2017  . Atrial fibrillation with RVR (HCC) 02/17/2017  . HLD (hyperlipidemia) 02/17/2017  . Acute hypoxemic respiratory failure (HCC)   . Stroke (cerebrum) (HCC) - Acute MCA infarct due to left CCA/ICA and MCA occlusion, s/p mechanical thrombectomy and left ICA stenting in the setting of newly diagnosed afib 02/14/2017    PMH: Past Medical History:  Diagnosis Date  . Abnormality of gait as late effect of stroke 02/23/2017  . Acute hypoxemic respiratory failure (HCC)   . Atrial fibrillation with RVR (HCC) 02/17/2017  . CAD S/P percutaneous coronary angioplasty 2001  . CVA (cerebral vascular accident) (HCC) 01/2017   . Dysphagia as late effect of stroke 02/23/2017  . HCVD (hypertensive cardiovascular disease) 01/2017   normal LVF with moderate LVH, grade 1 DD  . High cholesterol   . Hypokalemia   . Labile blood pressure   . Leukocytosis   . Multiple sclerosis (HCC)   . PAF (paroxysmal atrial fibrillation) (HCC) 01/2017  . Right hemiparesis (HCC)     PSH: Past Surgical History:  Procedure Laterality Date  . CORONARY ANGIOPLASTY WITH STENT PLACEMENT  11/1999   [facility]  . IR ANGIO INTRA EXTRACRAN SEL COM CAROTID INNOMINATE UNI R MOD SED  02/15/2017  . IR ANGIO VERTEBRAL SEL SUBCLAVIAN INNOMINATE UNI R MOD SED  02/15/2017  . IR INTRAVSC STENT CERV CAROTID W/O EMB-PROT MOD SED INC ANGIO  02/15/2017  . IR PERCUTANEOUS ART THROMBECTOMY/INFUSION INTRACRANIAL INC DIAG ANGIO  02/15/2017  . IR RADIOLOGIST EVAL & MGMT  05/10/2017  . NECK SURGERY    . RADIOLOGY WITH ANESTHESIA N/A 02/14/2017   Procedure: RADIOLOGY WITH ANESTHESIA;  Surgeon: Radiologist, Medication, MD;  Location: [facility] OR;  Service: Radiology;  Laterality: N/A;  . WRIST SURGERY       (Not in a hospital admission)  SH: Social History  Substance Use Topics  . Smoking status: Never Smoker  . Smokeless tobacco: Never Used  . Alcohol use 1.8 oz/week    3 Glasses of wine per week     Comment: wine    MEDS: Prior to Admission medications   Medication Sig Start Date End Date Taking?  Authorizing Provider  amiodarone (PACERONE) 200 MG tablet Take 1 tablet (200 mg total) by mouth daily. 03/19/17  Yes Love, Evlyn Kanner, PA-C  atorvastatin (LIPITOR) 40 MG tablet Take 1 tablet (40 mg total) by mouth daily at 6 PM. 03/18/17  Yes Love, Evlyn Kanner, PA-C  Calcium Citrate-Vitamin D (CALCIUM CITRATE + D PO) Take 1 tablet by mouth daily.   Yes [provider]  clopidogrel (PLAVIX) 75 MG tablet Take 1 tablet (75 mg total) by mouth daily. 03/19/17  Yes Love, Evlyn Kanner, PA-C  FLUoxetine (PROZAC) 10 MG capsule Take 1 capsule (10 mg total) by mouth  daily. 03/18/17  Yes Love, Evlyn Kanner, PA-C  metoprolol tartrate (LOPRESSOR) 50 MG tablet Take 1 tablet (50 mg total) by mouth 2 (two) times daily. 03/18/17  Yes Love, Evlyn Kanner, PA-C  Multiple Vitamins-Minerals (PRESERVISION AREDS PO) Take 1 tablet by mouth 2 (two) times daily.   Yes [provider]  pantoprazole (PROTONIX) 40 MG tablet Take 1 tablet (40 mg total) by mouth daily. 03/19/17  Yes Love, Evlyn Kanner, PA-C  Rivaroxaban (XARELTO) 15 MG TABS tablet Take 1 tablet (15 mg total) by mouth daily with supper. 03/18/17  Yes Love, Evlyn Kanner, PA-C  venlafaxine XR (EFFEXOR-XR) 75 MG 24 hr capsule Take 1 capsule (75 mg total) by mouth daily. 03/18/17  Yes Love, Evlyn Kanner, PA-C    ALLERGY: Allergies  Allergen Reactions  . Eliquis [Apixaban] Rash  . Penicillins Other (See Comments)    From childhood     Social History  Substance Use Topics  . Smoking status: Never Smoker  . Smokeless tobacco: Never Used  . Alcohol use 1.8 oz/week    3 Glasses of wine per week     Comment: wine     Family History  Problem Relation Age of Onset  . Heart disease Father   . Cancer Father   . Multiple sclerosis Sister   . Cancer Mother      ROS   Review of Systems  Constitutional: Negative.   HENT: Negative.   Eyes: Negative.   Respiratory: Negative.   Cardiovascular: Negative.   Gastrointestinal: Negative.   Genitourinary: Negative.   Musculoskeletal: Positive for falls. Negative for back pain, joint pain, myalgias and neck pain.  Skin: Negative.   Neurological: Negative for dizziness, tingling, tremors, sensory change, speech change, focal weakness, seizures, loss of consciousness and headaches.  Endo/Heme/Allergies: Bruises/bleeds easily.    Exam   Vitals:   06/04/17 0330 06/04/17 0400  BP: (!) 185/71 (!) 187/89  Pulse: (!) 59 (!) 59  Resp: 16   Temp:    SpO2: 94% 94%   General appearance: Elderly female, resting comfortably, NAD. Significant bruising and swelling over right  eye., Eyes: PERRL, Fundoscopic: normal Cardiovascular: Regular rate and rhythm without murmurs, rubs, gallops. No edema or variciosities. Distal pulses normal. Pulmonary: Clear to auscultation Musculoskeletal:     Muscle tone upper extremities: Normal    Muscle tone lower extremities: Normal    Motor exam: Upper Extremities Deltoid Bicep Tricep Grip  Right 4+/5 4+/5 4+/5 4+/5  Left 5/5 5/5 5/5 5/5   Lower Extremity IP Quad PF DF EHL  Right 4+/5 4+/5 4+/5 4+/5 4+/5  Left 5/5 5/5 5/5 5/5 5/5   Neurological Awake, alert, oriented Memory and concentration grossly intact Speech fluent, appropriate CNII: Visual fields normal CNIII/IV/VI: EOMI CNV: Facial sensation normal CNVII: Symmetric, normal strength CNVIII: Grossly normal CNIX: Normal palate movement CNXI: Trap and SCM strength normal CN XII:  Tongue protrusion normal Sensation grossly intact to LT DTR: Normal Coordination (finger/nose & heel/shin): Normal  Results - Imaging/Labs   Results for orders placed or performed during the hospital encounter of 06/04/17 (from the past 48 hour(s))  CBC with Differential     Status: Abnormal   Collection Time: 06/04/17  4:07 AM  Result Value Ref Range   WBC 7.4 4.0 - 10.5 K/uL   RBC 3.47 (L) 3.87 - 5.11 MIL/uL   Hemoglobin 10.4 (L) 12.0 - 15.0 g/dL   HCT 40.932.2 (L) 81.136.0 - 91.446.0 %   MCV 92.8 78.0 - 100.0 fL   MCH 30.0 26.0 - 34.0 pg   MCHC 32.3 30.0 - 36.0 g/dL   RDW 78.215.3 95.611.5 - 21.315.5 %   Platelets 235 150 - 400 K/uL   Neutrophils Relative % 78 %   Neutro Abs 5.7 1.7 - 7.7 K/uL   Lymphocytes Relative 13 %   Lymphs Abs 1.0 0.7 - 4.0 K/uL   Monocytes Relative 8 %   Monocytes Absolute 0.6 0.1 - 1.0 K/uL   Eosinophils Relative 1 %   Eosinophils Absolute 0.1 0.0 - 0.7 K/uL   Basophils Relative 0 %   Basophils Absolute 0.0 0.0 - 0.1 K/uL  Protime-INR     Status: Abnormal   Collection Time: 06/04/17  4:07 AM  Result Value Ref Range   Prothrombin Time 19.0 (H) 11.4 - 15.2 seconds    INR 1.61   APTT     Status: None   Collection Time: 06/04/17  4:07 AM  Result Value Ref Range   aPTT 32 24 - 36 seconds    Ct Head Wo Contrast  Result Date: 06/04/2017 CLINICAL DATA:  Larey SeatFell using a walker in bathroom at 7 p.m. No loss of consciousness. RIGHT periorbital hematoma. History of multiple sclerosis, stroke, atrial fibrillation. EXAM: CT HEAD WITHOUT CONTRAST CT MAXILLOFACIAL WITHOUT CONTRAST CT CERVICAL SPINE WITHOUT CONTRAST TECHNIQUE: Multidetector CT imaging of the head, cervical spine, and maxillofacial structures were performed using the standard protocol without intravenous contrast. Multiplanar CT image reconstructions of the cervical spine and maxillofacial structures were also generated. COMPARISON:  MRI of the head February 15, 2017 FINDINGS: CT HEAD FINDINGS BRAIN: Small volume RIGHT frontal subarachnoid hemorrhage with 4 mm possible RIGHT frontal lobe hemorrhagic contusion. No mass effect nor midline shift. Old LEFT basal ganglia infarct with ex vacuo dilatation LEFT lateral ventricle. No hydrocephalus. Patchy to confluent supratentorial white matter hypodensities. No acute large vascular territory infarct. Basal cisterns are patent. VASCULAR: Moderate calcific atherosclerosis of the carotid siphons. SKULL: No skull fracture. Large RIGHT frontal scalp hematoma without subcutaneous gas or radiopaque foreign bodies. SINUSES/ORBITS: The mastoid air-cells and included paranasal sinuses are well-aerated.The included ocular globes and orbital contents are non-suspicious. OTHER: None. CT MAXILLOFACIAL FINDINGS OSSEOUS: The mandible is intact, the condyles are located. Severe bilateral temporomandibular osteoarthrosis. No acute facial fracture. No destructive bony lesions. ORBITS: Ocular globes and orbital contents are nonsuspicious, status post bilateral ocular lens implants. SINUSES: Trace paranasal sinus mucosal thickening. Nasal septum is deviated the RIGHT. Included mastoid aircells are well  aerated. SOFT TISSUES: RIGHT masseter muscle hematoma. Severe RIGHT facial soft tissue and periorbital swelling with subcutaneous fat stranding. No focal fluid collections, subcutaneous gas or radiopaque foreign bodies. LIMITED INTRACRANIAL: Normal. CT CERVICAL SPINE FINDINGS ALIGNMENT: Maintained lordosis. Vertebral bodies in alignment. SKULL BASE AND VERTEBRAE: Cervical vertebral bodies and posterior elements are intact. Status post C4 through C7 ACDF with arthrodesis. C3-4 facets fused on degenerative basis. Mild  C7-T1 and T1-2 disc height loss with endplate spurring compatible with degenerative disc. C1-2 articulation maintained with severe arthropathy. Osteopenia. SOFT TISSUES AND SPINAL CANAL: Nonacute. LEFT carotid artery to internal carotid artery stent. DISC LEVELS: No significant osseous canal stenosis. Severe RIGHT C3-4 neural foraminal narrowing, moderate on the RIGHT at C5-6 and C6-7. UPPER CHEST: Biapical pleuroparenchymal scarring. Small RIGHT pleural effusion. OTHER: None. IMPRESSION: CT HEAD: 1. Small volume RIGHT frontal subarachnoid hemorrhage and subcentimeter suspected RIGHT frontal lobe hemorrhagic contusion. 2. Large RIGHT frontal scalp hematoma.  No skull fracture. 3. Old LEFT basal ganglia infarct. 4. Moderate to severe chronic small vessel ischemic disease. CT MAXILLOFACIAL: 1. Severe RIGHT facial and periorbital soft tissue swelling/contusion without postseptal involvement. RIGHT masseter muscle hematoma. 2. No acute facial fracture. CT CERVICAL SPINE: 1. No acute fracture. 2. Status post C4 through C7 ACDF with arthrodesis. 3. Small RIGHT pleural effusion.  Recommend chest radiograph. Acute findings discussed with and reconfirmed by PA.LISA SANDERS on 06/04/2017 at 3:59 am. Electronically Signed   By: Awilda Metro M.D.   On: 06/04/2017 04:00   Ct Cervical Spine Wo Contrast  Result Date: 06/04/2017 CLINICAL DATA:  Larey Seat using a walker in bathroom at 7 p.m. No loss of consciousness.  RIGHT periorbital hematoma. History of multiple sclerosis, stroke, atrial fibrillation. EXAM: CT HEAD WITHOUT CONTRAST CT MAXILLOFACIAL WITHOUT CONTRAST CT CERVICAL SPINE WITHOUT CONTRAST TECHNIQUE: Multidetector CT imaging of the head, cervical spine, and maxillofacial structures were performed using the standard protocol without intravenous contrast. Multiplanar CT image reconstructions of the cervical spine and maxillofacial structures were also generated. COMPARISON:  MRI of the head February 15, 2017 FINDINGS: CT HEAD FINDINGS BRAIN: Small volume RIGHT frontal subarachnoid hemorrhage with 4 mm possible RIGHT frontal lobe hemorrhagic contusion. No mass effect nor midline shift. Old LEFT basal ganglia infarct with ex vacuo dilatation LEFT lateral ventricle. No hydrocephalus. Patchy to confluent supratentorial white matter hypodensities. No acute large vascular territory infarct. Basal cisterns are patent. VASCULAR: Moderate calcific atherosclerosis of the carotid siphons. SKULL: No skull fracture. Large RIGHT frontal scalp hematoma without subcutaneous gas or radiopaque foreign bodies. SINUSES/ORBITS: The mastoid air-cells and included paranasal sinuses are well-aerated.The included ocular globes and orbital contents are non-suspicious. OTHER: None. CT MAXILLOFACIAL FINDINGS OSSEOUS: The mandible is intact, the condyles are located. Severe bilateral temporomandibular osteoarthrosis. No acute facial fracture. No destructive bony lesions. ORBITS: Ocular globes and orbital contents are nonsuspicious, status post bilateral ocular lens implants. SINUSES: Trace paranasal sinus mucosal thickening. Nasal septum is deviated the RIGHT. Included mastoid aircells are well aerated. SOFT TISSUES: RIGHT masseter muscle hematoma. Severe RIGHT facial soft tissue and periorbital swelling with subcutaneous fat stranding. No focal fluid collections, subcutaneous gas or radiopaque foreign bodies. LIMITED INTRACRANIAL: Normal. CT CERVICAL  SPINE FINDINGS ALIGNMENT: Maintained lordosis. Vertebral bodies in alignment. SKULL BASE AND VERTEBRAE: Cervical vertebral bodies and posterior elements are intact. Status post C4 through C7 ACDF with arthrodesis. C3-4 facets fused on degenerative basis. Mild C7-T1 and T1-2 disc height loss with endplate spurring compatible with degenerative disc. C1-2 articulation maintained with severe arthropathy. Osteopenia. SOFT TISSUES AND SPINAL CANAL: Nonacute. LEFT carotid artery to internal carotid artery stent. DISC LEVELS: No significant osseous canal stenosis. Severe RIGHT C3-4 neural foraminal narrowing, moderate on the RIGHT at C5-6 and C6-7. UPPER CHEST: Biapical pleuroparenchymal scarring. Small RIGHT pleural effusion. OTHER: None. IMPRESSION: CT HEAD: 1. Small volume RIGHT frontal subarachnoid hemorrhage and subcentimeter suspected RIGHT frontal lobe hemorrhagic contusion. 2. Large RIGHT frontal scalp hematoma.  No  skull fracture. 3. Old LEFT basal ganglia infarct. 4. Moderate to severe chronic small vessel ischemic disease. CT MAXILLOFACIAL: 1. Severe RIGHT facial and periorbital soft tissue swelling/contusion without postseptal involvement. RIGHT masseter muscle hematoma. 2. No acute facial fracture. CT CERVICAL SPINE: 1. No acute fracture. 2. Status post C4 through C7 ACDF with arthrodesis. 3. Small RIGHT pleural effusion.  Recommend chest radiograph. Acute findings discussed with and reconfirmed by PA.LISA SANDERS on 06/04/2017 at 3:59 am. Electronically Signed   By: Awilda Metro M.D.   On: 06/04/2017 04:00   Ct Maxillofacial Wo Contrast  Result Date: 06/04/2017 CLINICAL DATA:  Larey Seat using a walker in bathroom at 7 p.m. No loss of consciousness. RIGHT periorbital hematoma. History of multiple sclerosis, stroke, atrial fibrillation. EXAM: CT HEAD WITHOUT CONTRAST CT MAXILLOFACIAL WITHOUT CONTRAST CT CERVICAL SPINE WITHOUT CONTRAST TECHNIQUE: Multidetector CT imaging of the head, cervical spine, and  maxillofacial structures were performed using the standard protocol without intravenous contrast. Multiplanar CT image reconstructions of the cervical spine and maxillofacial structures were also generated. COMPARISON:  MRI of the head February 15, 2017 FINDINGS: CT HEAD FINDINGS BRAIN: Small volume RIGHT frontal subarachnoid hemorrhage with 4 mm possible RIGHT frontal lobe hemorrhagic contusion. No mass effect nor midline shift. Old LEFT basal ganglia infarct with ex vacuo dilatation LEFT lateral ventricle. No hydrocephalus. Patchy to confluent supratentorial white matter hypodensities. No acute large vascular territory infarct. Basal cisterns are patent. VASCULAR: Moderate calcific atherosclerosis of the carotid siphons. SKULL: No skull fracture. Large RIGHT frontal scalp hematoma without subcutaneous gas or radiopaque foreign bodies. SINUSES/ORBITS: The mastoid air-cells and included paranasal sinuses are well-aerated.The included ocular globes and orbital contents are non-suspicious. OTHER: None. CT MAXILLOFACIAL FINDINGS OSSEOUS: The mandible is intact, the condyles are located. Severe bilateral temporomandibular osteoarthrosis. No acute facial fracture. No destructive bony lesions. ORBITS: Ocular globes and orbital contents are nonsuspicious, status post bilateral ocular lens implants. SINUSES: Trace paranasal sinus mucosal thickening. Nasal septum is deviated the RIGHT. Included mastoid aircells are well aerated. SOFT TISSUES: RIGHT masseter muscle hematoma. Severe RIGHT facial soft tissue and periorbital swelling with subcutaneous fat stranding. No focal fluid collections, subcutaneous gas or radiopaque foreign bodies. LIMITED INTRACRANIAL: Normal. CT CERVICAL SPINE FINDINGS ALIGNMENT: Maintained lordosis. Vertebral bodies in alignment. SKULL BASE AND VERTEBRAE: Cervical vertebral bodies and posterior elements are intact. Status post C4 through C7 ACDF with arthrodesis. C3-4 facets fused on degenerative basis.  Mild C7-T1 and T1-2 disc height loss with endplate spurring compatible with degenerative disc. C1-2 articulation maintained with severe arthropathy. Osteopenia. SOFT TISSUES AND SPINAL CANAL: Nonacute. LEFT carotid artery to internal carotid artery stent. DISC LEVELS: No significant osseous canal stenosis. Severe RIGHT C3-4 neural foraminal narrowing, moderate on the RIGHT at C5-6 and C6-7. UPPER CHEST: Biapical pleuroparenchymal scarring. Small RIGHT pleural effusion. OTHER: None. IMPRESSION: CT HEAD: 1. Small volume RIGHT frontal subarachnoid hemorrhage and subcentimeter suspected RIGHT frontal lobe hemorrhagic contusion. 2. Large RIGHT frontal scalp hematoma.  No skull fracture. 3. Old LEFT basal ganglia infarct. 4. Moderate to severe chronic small vessel ischemic disease. CT MAXILLOFACIAL: 1. Severe RIGHT facial and periorbital soft tissue swelling/contusion without postseptal involvement. RIGHT masseter muscle hematoma. 2. No acute facial fracture. CT CERVICAL SPINE: 1. No acute fracture. 2. Status post C4 through C7 ACDF with arthrodesis. 3. Small RIGHT pleural effusion.  Recommend chest radiograph. Acute findings discussed with and reconfirmed by PA.LISA SANDERS on 06/04/2017 at 3:59 am. Electronically Signed   By: Awilda Metro M.D.   On: 06/04/2017 04:00  Impression/Plan   71 y.o. female with small traumatic SAH and hemorrhagic contusion after mechanical fall at home. No acute NS intervention indicated. Should resolve with time. Although she is almost 12 hours out from injury, with her Xarelto use and Plavix, she is high risk for worsening, although probably unlikely based on size. Will admit for observation. - Keppra 500mg  BID x7 days for seizure prophylaxis - Monitor neuro exam q 2 hours - Repeat head CT 1400. Sooner as indicated by neuro exam.

## 2017-06-04 NOTE — ED Notes (Signed)
ED Provider at bedside. 

## 2017-06-04 NOTE — Progress Notes (Signed)
Discharge instructions reviewed with patient and husband.  These included the following:  Prescriptions, when to call the MD, F/U appointments, stroke education r/t [facility], stroke symptoms, etc.  Patient safety also discussed.  Patient unsteady on her feet due to advancing MS.  Importance of taking medications stressed.  Comprehension of information ascertained via "teach-back" method.  Husband stated he was aware of his wife's advancing memory loss and inability to store information during education process.  He keeps a notebook with all her appointments, medication schedules, and concerns with him.  Patient discharged to home via private vehicle with husband.  Escorted to exit via wheelchair by nurse tech.

## 2017-06-04 NOTE — ED Provider Notes (Signed)
[facility] EMERGENCY DEPARTMENT Provider Note   CSN: [id number] Arrival date & time: 06/04/17  0146     History   Chief Complaint Chief Complaint  Patient presents with  . Fall    HPI Kaitlin [name] is a 71 y.o. female.  The history is provided by the patient and medical records.  Fall    71 year old female with history of A. Fib, coronary artery disease, multiple sclerosis, history of stroke in July with residual dysphasia and gait disturbance, presenting to the ED after a fall.  Husband reports they were at home this evening and she was using her walker in the bathroom when she got tangled up in the legs of the walker and fell striking her head against the cabinet.  Patient was able to get up immediately.  There was no loss of consciousness.  States she denies any injuries but has been reports over the next few hours she had progressively worsening swelling and bruising surrounding the right eye, right forehead, and right cheek.  Patient denies any pain, states it just feels "heavy" from the swelling.  She denies any dizziness or confusion.  No numbness or weakness.  She has remained ambulatory since the fall.  Husband reports they actually recently just saw her neurologist who is very pleased with her progress, however her gait is still not quite back to normal.  She does take Plavix and Xarelto.  Past Medical History:  Diagnosis Date  . Abnormality of gait as late effect of stroke 02/23/2017  . Acute hypoxemic respiratory failure (HCC)   . Atrial fibrillation with RVR (HCC) 02/17/2017  . CAD S/P percutaneous coronary angioplasty 2001  . CVA (cerebral vascular accident) (HCC) 01/2017  . Dysphagia as late effect of stroke 02/23/2017  . HCVD (hypertensive cardiovascular disease) 01/2017   normal LVF with moderate LVH, grade 1 DD  . High cholesterol   . Hypokalemia   . Labile blood pressure   . Leukocytosis   . Multiple sclerosis (HCC)   . PAF (paroxysmal atrial  fibrillation) (HCC) 01/2017  . Right hemiparesis [facility])     Patient Active Problem List   Diagnosis Date Noted  . Multiple sclerosis (HCC) 05/12/2017  . CAD (coronary artery disease) 04/12/2017  . Hypertension 04/11/2017  . Right hemiparesis (HCC)   . Aspiration pneumonia of right lung (HCC)   . Leukocytosis   . Hypokalemia   . Labile blood pressure   . Abnormality of gait as late effect of stroke 02/23/2017  . Dysarthria as late effect of stroke 02/23/2017  . Dysphagia as late effect of stroke 02/23/2017  . Acute ischemic left middle cerebral artery (MCA) stroke (HCC) 02/22/2017  . Paroxysmal A-fib (HCC) 02/17/2017  . Atrial fibrillation with RVR (HCC) 02/17/2017  . HLD (hyperlipidemia) 02/17/2017  . Acute hypoxemic respiratory failure (HCC)   . Stroke (cerebrum) (HCC) - Acute MCA infarct due to left CCA/ICA and MCA occlusion, s/p mechanical thrombectomy and left ICA stenting in the setting of newly diagnosed afib 02/14/2017    Past Surgical History:  Procedure Laterality Date  . CORONARY ANGIOPLASTY WITH STENT PLACEMENT  11/1999   [facility]  . IR ANGIO INTRA EXTRACRAN SEL COM CAROTID INNOMINATE UNI R MOD SED  02/15/2017  . IR ANGIO VERTEBRAL SEL SUBCLAVIAN INNOMINATE UNI R MOD SED  02/15/2017  . IR INTRAVSC STENT CERV CAROTID W/O EMB-PROT MOD SED INC ANGIO  02/15/2017  . IR PERCUTANEOUS ART THROMBECTOMY/INFUSION INTRACRANIAL INC DIAG ANGIO  02/15/2017  . IR  RADIOLOGIST EVAL & MGMT  05/10/2017  . NECK SURGERY    . RADIOLOGY WITH ANESTHESIA N/A 02/14/2017   Procedure: RADIOLOGY WITH ANESTHESIA;  Surgeon: Radiologist, Medication, MD;  Location: [facility] OR;  Service: Radiology;  Laterality: N/A;  . WRIST SURGERY      OB History    No data available       Home Medications    Prior to Admission medications   Medication Sig Start Date End Date Taking? Authorizing Provider  amiodarone (PACERONE) 200 MG tablet Take 1 tablet (200 mg total) by mouth daily. 03/19/17   Love, Evlyn KannerPamela S,  PA-C  atorvastatin (LIPITOR) 40 MG tablet Take 1 tablet (40 mg total) by mouth daily at 6 PM. 03/18/17   Love, Evlyn KannerPamela S, PA-C  Calcium Citrate-Vitamin D (CALCIUM CITRATE + D PO) Take 1 tablet by mouth daily.    [provider]  clopidogrel (PLAVIX) 75 MG tablet Take 1 tablet (75 mg total) by mouth daily. 03/19/17   Love, Evlyn KannerPamela S, PA-C  FLUoxetine (PROZAC) 10 MG capsule Take 1 capsule (10 mg total) by mouth daily. 03/18/17   Love, Evlyn KannerPamela S, PA-C  metoprolol tartrate (LOPRESSOR) 50 MG tablet Take 1 tablet (50 mg total) by mouth 2 (two) times daily. 03/18/17   Love, Evlyn KannerPamela S, PA-C  Multiple Vitamins-Minerals (PRESERVISION AREDS PO) Take 1 tablet by mouth 2 (two) times daily.    [provider]  pantoprazole (PROTONIX) 40 MG tablet Take 1 tablet (40 mg total) by mouth daily. 03/19/17   Love, Evlyn KannerPamela S, PA-C  Rivaroxaban (XARELTO) 15 MG TABS tablet Take 1 tablet (15 mg total) by mouth daily with supper. 03/18/17   Love, Evlyn KannerPamela S, PA-C  venlafaxine XR (EFFEXOR-XR) 75 MG 24 hr capsule Take 1 capsule (75 mg total) by mouth daily. 03/18/17   Jacquelynn CreeLove, Pamela S, PA-C    Family History Family History  Problem Relation Age of Onset  . Heart disease Father   . Cancer Father   . Multiple sclerosis Sister   . Cancer Mother     Social History Social History  Substance Use Topics  . Smoking status: Never Smoker  . Smokeless tobacco: Never Used  . Alcohol use 1.8 oz/week    3 Glasses of wine per week     Comment: wine     Allergies   Eliquis [apixaban] and Penicillins   Review of Systems Review of Systems  HENT: Positive for facial swelling.   Hematological: Bruises/bleeds easily.  All other systems reviewed and are negative.    Physical Exam Updated Vital Signs BP (!) 182/69 (BP Location: Left Arm)   Pulse (!) 59   Temp 97.6 F (36.4 C) (Oral)   Resp 18   SpO2 95%   Physical Exam  Constitutional: She is oriented to person, place, and time. She appears well-developed and  well-nourished.  HENT:  Head: Normocephalic and atraumatic.  Mouth/Throat: Oropharynx is clear and moist.  Large hematoma and bruising of right forehead, right temple, and surrounding right eye; there is no tenderness of the orbital rim but there is tenderness along right forehead; no open wounds or lacerations noted; mid-face stable; bridge of nose non-tender, no blood in the nares; no hemotympanum; dentition intact (wearing invisalign)  Eyes: Pupils are equal, round, and reactive to light. Conjunctivae and EOM are normal.  Neck: Normal range of motion. No spinous process tenderness and no muscular tenderness present. Normal range of motion present.  Cardiovascular: Normal rate, regular rhythm and normal heart sounds.  Pulmonary/Chest: Effort normal and breath sounds normal.  Abdominal: Soft. Bowel sounds are normal.  Musculoskeletal: Normal range of motion.  Neurological: She is alert and oriented to person, place, and time.  AAOx3, answering questions and following commands appropriately; equal strength UE and LE bilaterally; CN grossly intact; moves all extremities appropriately without ataxia; no focal neuro deficits or facial asymmetry appreciated  Skin: Skin is warm and dry.  Psychiatric: She has a normal mood and affect.  Nursing note and vitals reviewed.    ED Treatments / Results  Labs (all labs ordered are listed, but only abnormal results are displayed) Labs Reviewed - No data to display  EKG  EKG Interpretation None       Radiology Ct Head Wo Contrast  Result Date: 06/04/2017 CLINICAL DATA:  Larey Seat using a walker in bathroom at 7 p.m. No loss of consciousness. RIGHT periorbital hematoma. History of multiple sclerosis, stroke, atrial fibrillation. EXAM: CT HEAD WITHOUT CONTRAST CT MAXILLOFACIAL WITHOUT CONTRAST CT CERVICAL SPINE WITHOUT CONTRAST TECHNIQUE: Multidetector CT imaging of the head, cervical spine, and maxillofacial structures were performed using the standard  protocol without intravenous contrast. Multiplanar CT image reconstructions of the cervical spine and maxillofacial structures were also generated. COMPARISON:  MRI of the head February 15, 2017 FINDINGS: CT HEAD FINDINGS BRAIN: Small volume RIGHT frontal subarachnoid hemorrhage with 4 mm possible RIGHT frontal lobe hemorrhagic contusion. No mass effect nor midline shift. Old LEFT basal ganglia infarct with ex vacuo dilatation LEFT lateral ventricle. No hydrocephalus. Patchy to confluent supratentorial white matter hypodensities. No acute large vascular territory infarct. Basal cisterns are patent. VASCULAR: Moderate calcific atherosclerosis of the carotid siphons. SKULL: No skull fracture. Large RIGHT frontal scalp hematoma without subcutaneous gas or radiopaque foreign bodies. SINUSES/ORBITS: The mastoid air-cells and included paranasal sinuses are well-aerated.The included ocular globes and orbital contents are non-suspicious. OTHER: None. CT MAXILLOFACIAL FINDINGS OSSEOUS: The mandible is intact, the condyles are located. Severe bilateral temporomandibular osteoarthrosis. No acute facial fracture. No destructive bony lesions. ORBITS: Ocular globes and orbital contents are nonsuspicious, status post bilateral ocular lens implants. SINUSES: Trace paranasal sinus mucosal thickening. Nasal septum is deviated the RIGHT. Included mastoid aircells are well aerated. SOFT TISSUES: RIGHT masseter muscle hematoma. Severe RIGHT facial soft tissue and periorbital swelling with subcutaneous fat stranding. No focal fluid collections, subcutaneous gas or radiopaque foreign bodies. LIMITED INTRACRANIAL: Normal. CT CERVICAL SPINE FINDINGS ALIGNMENT: Maintained lordosis. Vertebral bodies in alignment. SKULL BASE AND VERTEBRAE: Cervical vertebral bodies and posterior elements are intact. Status post C4 through C7 ACDF with arthrodesis. C3-4 facets fused on degenerative basis. Mild C7-T1 and T1-2 disc height loss with endplate spurring  compatible with degenerative disc. C1-2 articulation maintained with severe arthropathy. Osteopenia. SOFT TISSUES AND SPINAL CANAL: Nonacute. LEFT carotid artery to internal carotid artery stent. DISC LEVELS: No significant osseous canal stenosis. Severe RIGHT C3-4 neural foraminal narrowing, moderate on the RIGHT at C5-6 and C6-7. UPPER CHEST: Biapical pleuroparenchymal scarring. Small RIGHT pleural effusion. OTHER: None. IMPRESSION: CT HEAD: 1. Small volume RIGHT frontal subarachnoid hemorrhage and subcentimeter suspected RIGHT frontal lobe hemorrhagic contusion. 2. Large RIGHT frontal scalp hematoma.  No skull fracture. 3. Old LEFT basal ganglia infarct. 4. Moderate to severe chronic small vessel ischemic disease. CT MAXILLOFACIAL: 1. Severe RIGHT facial and periorbital soft tissue swelling/contusion without postseptal involvement. RIGHT masseter muscle hematoma. 2. No acute facial fracture. CT CERVICAL SPINE: 1. No acute fracture. 2. Status post C4 through C7 ACDF with arthrodesis. 3. Small RIGHT pleural effusion.  Recommend  chest radiograph. Acute findings discussed with and reconfirmed by PA.Bode Pieper on 06/04/2017 at 3:59 am. Electronically Signed   By: Awilda Metro M.D.   On: 06/04/2017 04:00   Ct Cervical Spine Wo Contrast  Result Date: 06/04/2017 CLINICAL DATA:  Larey Seat using a walker in bathroom at 7 p.m. No loss of consciousness. RIGHT periorbital hematoma. History of multiple sclerosis, stroke, atrial fibrillation. EXAM: CT HEAD WITHOUT CONTRAST CT MAXILLOFACIAL WITHOUT CONTRAST CT CERVICAL SPINE WITHOUT CONTRAST TECHNIQUE: Multidetector CT imaging of the head, cervical spine, and maxillofacial structures were performed using the standard protocol without intravenous contrast. Multiplanar CT image reconstructions of the cervical spine and maxillofacial structures were also generated. COMPARISON:  MRI of the head February 15, 2017 FINDINGS: CT HEAD FINDINGS BRAIN: Small volume RIGHT frontal  subarachnoid hemorrhage with 4 mm possible RIGHT frontal lobe hemorrhagic contusion. No mass effect nor midline shift. Old LEFT basal ganglia infarct with ex vacuo dilatation LEFT lateral ventricle. No hydrocephalus. Patchy to confluent supratentorial white matter hypodensities. No acute large vascular territory infarct. Basal cisterns are patent. VASCULAR: Moderate calcific atherosclerosis of the carotid siphons. SKULL: No skull fracture. Large RIGHT frontal scalp hematoma without subcutaneous gas or radiopaque foreign bodies. SINUSES/ORBITS: The mastoid air-cells and included paranasal sinuses are well-aerated.The included ocular globes and orbital contents are non-suspicious. OTHER: None. CT MAXILLOFACIAL FINDINGS OSSEOUS: The mandible is intact, the condyles are located. Severe bilateral temporomandibular osteoarthrosis. No acute facial fracture. No destructive bony lesions. ORBITS: Ocular globes and orbital contents are nonsuspicious, status post bilateral ocular lens implants. SINUSES: Trace paranasal sinus mucosal thickening. Nasal septum is deviated the RIGHT. Included mastoid aircells are well aerated. SOFT TISSUES: RIGHT masseter muscle hematoma. Severe RIGHT facial soft tissue and periorbital swelling with subcutaneous fat stranding. No focal fluid collections, subcutaneous gas or radiopaque foreign bodies. LIMITED INTRACRANIAL: Normal. CT CERVICAL SPINE FINDINGS ALIGNMENT: Maintained lordosis. Vertebral bodies in alignment. SKULL BASE AND VERTEBRAE: Cervical vertebral bodies and posterior elements are intact. Status post C4 through C7 ACDF with arthrodesis. C3-4 facets fused on degenerative basis. Mild C7-T1 and T1-2 disc height loss with endplate spurring compatible with degenerative disc. C1-2 articulation maintained with severe arthropathy. Osteopenia. SOFT TISSUES AND SPINAL CANAL: Nonacute. LEFT carotid artery to internal carotid artery stent. DISC LEVELS: No significant osseous canal stenosis.  Severe RIGHT C3-4 neural foraminal narrowing, moderate on the RIGHT at C5-6 and C6-7. UPPER CHEST: Biapical pleuroparenchymal scarring. Small RIGHT pleural effusion. OTHER: None. IMPRESSION: CT HEAD: 1. Small volume RIGHT frontal subarachnoid hemorrhage and subcentimeter suspected RIGHT frontal lobe hemorrhagic contusion. 2. Large RIGHT frontal scalp hematoma.  No skull fracture. 3. Old LEFT basal ganglia infarct. 4. Moderate to severe chronic small vessel ischemic disease. CT MAXILLOFACIAL: 1. Severe RIGHT facial and periorbital soft tissue swelling/contusion without postseptal involvement. RIGHT masseter muscle hematoma. 2. No acute facial fracture. CT CERVICAL SPINE: 1. No acute fracture. 2. Status post C4 through C7 ACDF with arthrodesis. 3. Small RIGHT pleural effusion.  Recommend chest radiograph. Acute findings discussed with and reconfirmed by PA.Alajah Witman on 06/04/2017 at 3:59 am. Electronically Signed   By: Awilda Metro M.D.   On: 06/04/2017 04:00   Ct Maxillofacial Wo Contrast  Result Date: 06/04/2017 CLINICAL DATA:  Larey Seat using a walker in bathroom at 7 p.m. No loss of consciousness. RIGHT periorbital hematoma. History of multiple sclerosis, stroke, atrial fibrillation. EXAM: CT HEAD WITHOUT CONTRAST CT MAXILLOFACIAL WITHOUT CONTRAST CT CERVICAL SPINE WITHOUT CONTRAST TECHNIQUE: Multidetector CT imaging of the head, cervical spine, and maxillofacial structures  were performed using the standard protocol without intravenous contrast. Multiplanar CT image reconstructions of the cervical spine and maxillofacial structures were also generated. COMPARISON:  MRI of the head February 15, 2017 FINDINGS: CT HEAD FINDINGS BRAIN: Small volume RIGHT frontal subarachnoid hemorrhage with 4 mm possible RIGHT frontal lobe hemorrhagic contusion. No mass effect nor midline shift. Old LEFT basal ganglia infarct with ex vacuo dilatation LEFT lateral ventricle. No hydrocephalus. Patchy to confluent supratentorial white  matter hypodensities. No acute large vascular territory infarct. Basal cisterns are patent. VASCULAR: Moderate calcific atherosclerosis of the carotid siphons. SKULL: No skull fracture. Large RIGHT frontal scalp hematoma without subcutaneous gas or radiopaque foreign bodies. SINUSES/ORBITS: The mastoid air-cells and included paranasal sinuses are well-aerated.The included ocular globes and orbital contents are non-suspicious. OTHER: None. CT MAXILLOFACIAL FINDINGS OSSEOUS: The mandible is intact, the condyles are located. Severe bilateral temporomandibular osteoarthrosis. No acute facial fracture. No destructive bony lesions. ORBITS: Ocular globes and orbital contents are nonsuspicious, status post bilateral ocular lens implants. SINUSES: Trace paranasal sinus mucosal thickening. Nasal septum is deviated the RIGHT. Included mastoid aircells are well aerated. SOFT TISSUES: RIGHT masseter muscle hematoma. Severe RIGHT facial soft tissue and periorbital swelling with subcutaneous fat stranding. No focal fluid collections, subcutaneous gas or radiopaque foreign bodies. LIMITED INTRACRANIAL: Normal. CT CERVICAL SPINE FINDINGS ALIGNMENT: Maintained lordosis. Vertebral bodies in alignment. SKULL BASE AND VERTEBRAE: Cervical vertebral bodies and posterior elements are intact. Status post C4 through C7 ACDF with arthrodesis. C3-4 facets fused on degenerative basis. Mild C7-T1 and T1-2 disc height loss with endplate spurring compatible with degenerative disc. C1-2 articulation maintained with severe arthropathy. Osteopenia. SOFT TISSUES AND SPINAL CANAL: Nonacute. LEFT carotid artery to internal carotid artery stent. DISC LEVELS: No significant osseous canal stenosis. Severe RIGHT C3-4 neural foraminal narrowing, moderate on the RIGHT at C5-6 and C6-7. UPPER CHEST: Biapical pleuroparenchymal scarring. Small RIGHT pleural effusion. OTHER: None. IMPRESSION: CT HEAD: 1. Small volume RIGHT frontal subarachnoid hemorrhage and  subcentimeter suspected RIGHT frontal lobe hemorrhagic contusion. 2. Large RIGHT frontal scalp hematoma.  No skull fracture. 3. Old LEFT basal ganglia infarct. 4. Moderate to severe chronic small vessel ischemic disease. CT MAXILLOFACIAL: 1. Severe RIGHT facial and periorbital soft tissue swelling/contusion without postseptal involvement. RIGHT masseter muscle hematoma. 2. No acute facial fracture. CT CERVICAL SPINE: 1. No acute fracture. 2. Status post C4 through C7 ACDF with arthrodesis. 3. Small RIGHT pleural effusion.  Recommend chest radiograph. Acute findings discussed with and reconfirmed by PA.Ximena Todaro on 06/04/2017 at 3:59 am. Electronically Signed   By: Awilda Metro M.D.   On: 06/04/2017 04:00    Procedures Procedures (including critical care time)  CRITICAL CARE Performed by: Garlon Hatchet   Total critical care time: 40 minutes  Critical care time was exclusive of separately billable procedures and treating other patients.  Critical care was necessary to treat or prevent imminent or life-threatening deterioration.  Critical care was time spent personally by me on the following activities: development of treatment plan with patient and/or surrogate as well as nursing, discussions with consultants, evaluation of patient's response to treatment, examination of patient, obtaining history from patient or surrogate, ordering and performing treatments and interventions, ordering and review of laboratory studies, ordering and review of radiographic studies, pulse oximetry and re-evaluation of patient's condition.   Medications Ordered in ED Medications - No data to display   Initial Impression / Assessment and Plan / ED Course  I have reviewed the triage vital signs and the nursing notes.  Pertinent labs & imaging results that were available during my care of the patient were reviewed by me and considered in my medical decision making (see chart for details).  71 year old  female here after a fall at home.  Has had prior stroke and has gait disturbance secondary to this.  She does use a walker.  She is in the bathroom and her feet got tangled up in the legs with water or juice tract cabinet.  There was no loss of consciousness.  Initially felt like she was fine but had increased swelling and bruising over the next few hours so has been brought her in for evaluation.  Patient denies any pain, states her eye just feels "heavy" from the swelling.  She denies any blurred vision.  She remains ambulatory.  She is on Plavix and Xarelto.  Given this, will obtain CT imaging of the head, face, and neck.  Patient declined pain medication.  CT scan does reveal right frontal subarachnoid hemorrhage as well as right frontal lobe hemorrhagic contusion.  There is no facial fractures, but there is significant hematoma of right face.  Case was discussed with on-call neurosurgery who has evaluated patient in the ED, they will admit for observation given her double anticoagulant use.  Patient agreeable to plan.  Final Clinical Impressions(s) / ED Diagnoses   Final diagnoses:  Subarachnoid bleed [facility])    New Prescriptions Current Discharge Medication List       Garlon Hatchet, PA-C 06/04/17 1610    Garlon Hatchet, PA-C 06/04/17 [id number]    Gilda Crease, MD 06/04/17 [phone]

## 2017-06-04 NOTE — Discharge Summary (Signed)
Physician Discharge Summary  Patient ID: Kaitlin [name] MRN: [id number] DOB/AGE: [birth year] 71 y.o.  Admit date: 06/04/2017 Discharge date: 06/04/2017  Admission Diagnoses:  Traumatic [facility]  Discharge Diagnoses:  Same Active Problems:   Subarachnoid hemorrhage [facility])  Discharged Condition: Stable  Hospital Course:  Kaitlin [name] is a 71 y.o. female who was admitted after suffering a traumatic SAH and contusion. She was neuro intact upon arrival. Admitted for obs due to xarelto and plavix use. Repeat head CT performed and was stable. Cleared for discharge home. Strong return precautions discussed. F/U 4 weeks.   Treatments: Surgery - None  Discharge Exam: Blood pressure (!) 160/69, pulse 61, temperature 98 F (36.7 C), temperature source Oral, resp. rate 18, height 5\' 2"  (1.575 m), weight 65 kg (143 lb 3.2 oz), SpO2 96 %. Awake, alert, oriented Speech fluent, appropriate CN grossly intact MAEW with good strength Wound c/d/i  Disposition: 01-Home or Self Care  Discharge Instructions    Call MD for:  extreme fatigue    Complete by:  As directed    Call MD for:  persistant dizziness or light-headedness    Complete by:  As directed    Call MD for:  severe uncontrolled pain    Complete by:  As directed    Call MD for:  temperature >100.4    Complete by:  As directed    Diet general    Complete by:  As directed    Driving Restrictions    Complete by:  As directed    Do not drive until given clearance.   Increase activity slowly    Complete by:  As directed    Lifting restrictions    Complete by:  As directed    Do not lift anything >10lbs. Avoid bending and twisting in awkward positions. Avoid bending at the back.     Allergies as of 06/04/2017      Reactions   Eliquis [apixaban] Rash   Penicillins Other (See Comments)   From childhood      Medication List    TAKE these medications   amiodarone 200 MG tablet Commonly known as:  PACERONE Take 1 tablet (200 mg  total) by mouth daily.   atorvastatin 40 MG tablet Commonly known as:  LIPITOR Take 1 tablet (40 mg total) by mouth daily at 6 PM.   CALCIUM CITRATE + D PO Take 1 tablet by mouth daily.   clopidogrel 75 MG tablet Commonly known as:  PLAVIX Take 1 tablet (75 mg total) by mouth daily.   FLUoxetine 10 MG capsule Commonly known as:  PROZAC Take 1 capsule (10 mg total) by mouth daily.   HYDROcodone-acetaminophen 5-325 MG tablet Commonly known as:  NORCO/VICODIN Take 1-2 tablets by mouth every 4 (four) hours as needed for moderate pain.   levETIRAcetam 500 MG tablet Commonly known as:  KEPPRA Take 1 tablet (500 mg total) by mouth 2 (two) times daily.   metoprolol tartrate 50 MG tablet Commonly known as:  LOPRESSOR Take 1 tablet (50 mg total) by mouth 2 (two) times daily.   pantoprazole 40 MG tablet Commonly known as:  PROTONIX Take 1 tablet (40 mg total) by mouth daily.   PRESERVISION AREDS PO Take 1 tablet by mouth 2 (two) times daily.   Rivaroxaban 15 MG Tabs tablet Commonly known as:  XARELTO Take 1 tablet (15 mg total) by mouth daily with supper.   venlafaxine XR 75 MG 24 hr capsule Commonly known as:  EFFEXOR-XR Take 1 capsule (75  mg total) by mouth daily.      Follow-up Information    Vandora Jaskulski, Darci Current, PA-C. Schedule an appointment as soon as possible for a visit in 4 week(s).   Specialty:  Physician Assistant Contact information: [address] [place] [address] [phone]           Signed: Alyson Ingles 06/04/2017, 7:39 AM

## 2017-06-04 NOTE — ED Triage Notes (Addendum)
Pt had stroke in July.  Husband reports she got "tangled up" with her walker in the bathroom and fell around 7pm.  Husband didn't notice any injuries at that time.  Large hematoma to R eye, unable to open R eyelid, and hematoma to R side of forehead.  Denies LOC.  Pt denies pain.  Pt also seen in ED 10/17 for fall.

## 2017-06-06 ENCOUNTER — Ambulatory Visit: Payer: Medicare Other | Admitting: Speech Pathology

## 2017-06-06 ENCOUNTER — Other Ambulatory Visit (HOSPITAL_COMMUNITY): Payer: Self-pay | Admitting: Interventional Radiology

## 2017-06-06 ENCOUNTER — Ambulatory Visit: Payer: Medicare Other | Attending: Physical Medicine & Rehabilitation | Admitting: Physical Therapy

## 2017-06-06 ENCOUNTER — Encounter: Payer: Self-pay | Admitting: Physical Therapy

## 2017-06-06 ENCOUNTER — Ambulatory Visit: Payer: Medicare Other | Admitting: Occupational Therapy

## 2017-06-06 DIAGNOSIS — R2689 Other abnormalities of gait and mobility: Secondary | ICD-10-CM | POA: Insufficient documentation

## 2017-06-06 DIAGNOSIS — I69353 Hemiplegia and hemiparesis following cerebral infarction affecting right non-dominant side: Secondary | ICD-10-CM

## 2017-06-06 DIAGNOSIS — R482 Apraxia: Secondary | ICD-10-CM

## 2017-06-06 DIAGNOSIS — I639 Cerebral infarction, unspecified: Secondary | ICD-10-CM

## 2017-06-06 DIAGNOSIS — R471 Dysarthria and anarthria: Secondary | ICD-10-CM | POA: Diagnosis present

## 2017-06-06 DIAGNOSIS — R4701 Aphasia: Secondary | ICD-10-CM | POA: Insufficient documentation

## 2017-06-06 DIAGNOSIS — R2681 Unsteadiness on feet: Secondary | ICD-10-CM | POA: Insufficient documentation

## 2017-06-06 DIAGNOSIS — I69318 Other symptoms and signs involving cognitive functions following cerebral infarction: Secondary | ICD-10-CM | POA: Insufficient documentation

## 2017-06-06 DIAGNOSIS — M6281 Muscle weakness (generalized): Secondary | ICD-10-CM | POA: Diagnosis not present

## 2017-06-06 DIAGNOSIS — R41841 Cognitive communication deficit: Secondary | ICD-10-CM

## 2017-06-06 DIAGNOSIS — R278 Other lack of coordination: Secondary | ICD-10-CM | POA: Insufficient documentation

## 2017-06-06 NOTE — Therapy (Signed)
[facility] [address] [place], [place], [address] Phone: [phone]   Fax:  [phone]  Physical Therapy Treatment  Patient Details  Name: Kaitlin [name] MRN: [id number] Date of Birth: February 14, 1946 Referring Provider: Alysia Penna MD   Encounter Date: 06/06/2017  PT End of Session - 06/06/17 2149    Visit Number  8    Number of Visits  17    Date for PT Re-Evaluation  07/04/17    Authorization Type  [employer] MCR    Authorization Time Period  05/05/17  to  07/04/17    PT Start Time  1102    PT Stop Time  1144    PT Time Calculation (min)  42 min    Equipment Utilized During Treatment  Gait belt    Activity Tolerance  Patient tolerated treatment well    Behavior During Therapy  [facility] for tasks assessed/performed       Past Medical History:  Diagnosis Date  . Abnormality of gait as late effect of stroke 02/23/2017  . Acute hypoxemic respiratory failure ([place])   . Atrial fibrillation with RVR ([place]) 02/17/2017  . CAD S/P percutaneous coronary angioplasty 2001  . CVA (cerebral vascular accident) ([place]) 01/2017  . Dysphagia as late effect of stroke 02/23/2017  . HCVD (hypertensive cardiovascular disease) 01/2017   normal LVF with moderate LVH, grade 1 DD  . High cholesterol   . Hypokalemia   . Labile blood pressure   . Leukocytosis   . Multiple sclerosis ([place])   . PAF (paroxysmal atrial fibrillation) ([place]) 01/2017  . Right hemiparesis [facility])     Past Surgical History:  Procedure Laterality Date  . CORONARY ANGIOPLASTY WITH STENT PLACEMENT  11/1999   RCA DES-[facility]  . IR ANGIO INTRA EXTRACRAN SEL COM CAROTID INNOMINATE UNI R MOD SED  02/15/2017  . IR ANGIO VERTEBRAL SEL SUBCLAVIAN INNOMINATE UNI R MOD SED  02/15/2017  . IR INTRAVSC STENT CERV CAROTID W/O EMB-PROT MOD SED INC ANGIO  02/15/2017  . IR PERCUTANEOUS ART THROMBECTOMY/INFUSION INTRACRANIAL INC DIAG ANGIO  02/15/2017  . IR RADIOLOGIST EVAL & MGMT  05/10/2017  .  NECK SURGERY    . WRIST SURGERY      There were no vitals filed for this visit.  Subjective Assessment - 06/06/17 1018    Subjective  Fell over the weekend with trip to the ED. Husband reports pt was getting ready for bed in the bathroom and got tangled up with the RW. +small SAH frontal/interhemispheric; Husband reports that patient does not wear Rt AFO at home and "walks fine." He agrees he does hear her right foot drag as she gets tired.    Patient is accompained by:  Family member    Patient Stated Goals  "Standing up straight; getting my self-control" (balance)    Currently in Pain?  No/denies         [facility] PT Assessment - 06/06/17 1109      Strength   Overall Strength Comments  LLE: WNL except Knee extension 4+/5, flexion 3+/5;  RLE- WNL except knee extension 4/5, flexion 3+/5; ankle DF 4+                  [facility] Adult PT Treatment/Exercise - 06/06/17 0001      Transfers   Sit to Stand  4: Min guard    Stand to Sit  4: Min guard      Ambulation/Gait   Ambulation/Gait  Yes    Ambulation/Gait  Assistance  4: Min assist    Ambulation/Gait Assistance Details  instructed in use of SPC with quad rubber tip--required hand-over-hand assist and constant cues for proper use; pace very slow and multiple LOB with assist to recover    Ambulation Distance (Feet)  120 Feet x 3   x 3   Assistive device  Straight cane;None    Gait Pattern  Step-to pattern;Step-through pattern;Decreased step length - left;Decreased stance time - right;Decreased dorsiflexion - right;Decreased weight shift to left;Right foot flat;Shuffle;Narrow base of support    Ambulation Surface  Indoor             PT Education - 06/06/17 2146    Education provided  Yes    Education Details  husband must be with patient at all times when she is on her feet; stop using RW, resume use of gait belt and revviewed proper use; consider seat belt for her w/c as a reminder to not get up alone (especially when he  showers or using restroom)    Person(s) Educated  Patient;Spouse    Methods  Explanation;Demonstration    Comprehension  Verbalized understanding;Need further instruction       PT Short Term Goals - 05/31/17 1531      PT SHORT TERM GOAL #1   Title  Patient (with supervision to min-guard assistance of her spouse) will perform basic HEP for balance and strengthening. (Target STGs 06/04/17)    Baseline  05/30/17: goals achieved    Time  4    Period  Weeks    Status  Achieved      PT SHORT TERM GOAL #2   Title  Patient will complete FGA with STG and LTG set as appropriate.    Baseline  10/10 FGA 9/30; 10/16 goal set    Time  1    Period  Weeks    Status  Achieved      PT SHORT TERM GOAL #3   Title  Patient will improve TUG with or without a device to <15 seconds to demonstrate a lesser fall risk.     Baseline  rollator 18.25; no device 17.81 sec. 05/30/17: No AD 18.10/ RW 26.59    Time  4    Period  Weeks    Status  Not Met      PT SHORT TERM GOAL #4   Title  Patient will improve her gait velocity to 2.62 ft/sec to indicate lesser fall risk with community ambulation.     Baseline  05/30/17: 2.04 ft/sec without AD 1.34f/sec with AD    Time  4    Period  Weeks    Status  Not Met      PT SHORT TERM GOAL #5   Title  Patient will improve FGA score to >=14/30 to demonstrate progress towards lesser fall risk    Baseline  05/31/17: Pt improved score to 16/30    Time  3    Period  Weeks    Status  Achieved        PT Long Term Goals - 05/31/17 1536      PT LONG TERM GOAL #1   Title  Patient and/or husband will be able to verbalize signs and symptoms of CVA and appropriate action to take. (Target LTGs: 07/04/17)    Time  8    Period  Weeks    Status  New      PT LONG TERM GOAL #2   Title  Patient (with supervision of husband)  will perform updated HEP    Time  8    Period  Weeks    Status  New      PT LONG TERM GOAL #3   Title  Patient will ambulate >=2.79 ft/sec (age  based norm) with LRAD or no device on level surface.    Time  8    Period  Weeks    Status  New      PT LONG TERM GOAL #4   Title  Patient will ambulate >= 1000 ft on outdoor surfaces (level and unlevel, grass, gravel, mulch) with LRAD and supervision.     Time  8    Period  Weeks    Status  New      PT LONG TERM GOAL #5   Title  Patient will perform TUG <13.5 seconds to demonstrate lesser fall risk.    Time  8    Period  Weeks    Status  New      PT LONG TERM GOAL #6   Title  Patient will improve FGA score by 4 points above her score assessed at STGs (TBA).    Baseline  10/16 goal set per STG set on eval    Time  Force - 06/06/17 2151    Clinical Impression Statement  Session focused on reassessing patient's strength and functional status s/p fall and determined that goals are still appropriate. Remainder of session focused on determining most safe option for pt's locomotion in her home environment. Patient was not able to easily, correctly use [facility] effectively. Husband reports each of pt's falls at home have involved her getting "tangled up" with the RW. He agreed to assist patient any time she is on her feet and to use the gait belt the hospital issued them. Patient can continue to benefit from PT to decrease fall risk and assist spouse in implementing strategies in the home to reduce fall risk.     Rehab Potential  Good    Clinical Impairments Affecting Rehab Potential  impulsivity and decr awareness of deficits    PT Frequency  2x / week    PT Duration  8 weeks    PT Treatment/Interventions  ADLs/Self Care Home Management;DME Instruction;Gait training;Stair training;Functional mobility training;Orthotic Fit/Training;Patient/family education;Cognitive remediation;Neuromuscular re-education;Balance training;Therapeutic exercise;Therapeutic activities    PT Next Visit Plan  Husband to bring in her shoes she wears at home without the  AFO--states she walks best in those shoes (we did discuss in community to still use the AFO as she drags rt foot with fatigue). Ask husband if he looked into/obtained a seat belt for her w/c; work on gait without AD and with gait belt; Continue to work on wt-shifting tasks with single UE support, and LE strengthening on compliant surfaces, and cognitive challenging tasks.     Consulted and Agree with Plan of Care  Patient;Family member/caregiver    Family Member Consulted  spouse, [name]       Patient will benefit from skilled therapeutic intervention in order to improve the following deficits and impairments:  Abnormal gait, Decreased balance, Decreased cognition, Decreased knowledge of use of DME, Decreased mobility, Decreased safety awareness, Decreased strength, Difficulty walking, Impaired perceived functional ability, Impaired sensation, Impaired UE functional use  Visit Diagnosis: Muscle weakness (generalized)  Unsteadiness on feet  Other abnormalities of gait and mobility  Problem List Patient Active Problem List   Diagnosis Date Noted  . Subarachnoid hemorrhage ([place]) 06/04/2017  . Multiple sclerosis ([place]) 05/12/2017  . CAD (coronary artery disease) 04/12/2017  . Hypertension 04/11/2017  . Right hemiparesis ([place])   . Aspiration pneumonia of right lung ([place])   . Leukocytosis   . Hypokalemia   . Labile blood pressure   . Abnormality of gait as late effect of stroke 02/23/2017  . Dysarthria as late effect of stroke 02/23/2017  . Dysphagia as late effect of stroke 02/23/2017  . Acute ischemic left middle cerebral artery (MCA) stroke ([place]) 02/22/2017  . Paroxysmal A-fib ([place]) 02/17/2017  . Atrial fibrillation with RVR ([place]) 02/17/2017  . HLD (hyperlipidemia) 02/17/2017  . Acute hypoxemic respiratory failure ([place])   . Stroke (cerebrum) ([place]) - Acute MCA infarct due to left CCA/ICA and MCA occlusion, s/p mechanical thrombectomy and left ICA stenting in the setting of newly  diagnosed afib 02/14/2017    Rexanne Mano, PT 06/06/2017, 10:00 PM  [facility] [address] [place], [place], [address] Phone: [phone]   Fax:  [phone]  Name: Tomika Eckles MRN: [id number] Date of Birth: 28-Jul-1946

## 2017-06-06 NOTE — Therapy (Signed)
[facility] [address] [place], [place], [address] Phone: [phone]   Fax:  [phone]  Speech Language Pathology Treatment  Patient Details  Name: Kaitlin [name] MRN: [id number] Date of Birth: [birth year] Referring Provider: Alysia Penna, MD   Encounter Date: 06/06/2017  End of Session - 06/06/17 1314    Visit Number  8    Number of Visits  17    Date for SLP Re-Evaluation  07/11/17    SLP Start Time  0931    SLP Stop Time   1015    SLP Time Calculation (min)  44 min    Activity Tolerance  Patient tolerated treatment well       Past Medical History:  Diagnosis Date  . Abnormality of gait as late effect of stroke 02/23/2017  . Acute hypoxemic respiratory failure ([place])   . Atrial fibrillation with RVR ([place]) 02/17/2017  . CAD S/P percutaneous coronary angioplasty 2001  . CVA (cerebral vascular accident) ([place]) 01/2017  . Dysphagia as late effect of stroke 02/23/2017  . HCVD (hypertensive cardiovascular disease) 01/2017   normal LVF with moderate LVH, grade 1 DD  . High cholesterol   . Hypokalemia   . Labile blood pressure   . Leukocytosis   . Multiple sclerosis ([place])   . PAF (paroxysmal atrial fibrillation) ([place]) 01/2017  . Right hemiparesis [facility])     Past Surgical History:  Procedure Laterality Date  . CORONARY ANGIOPLASTY WITH STENT PLACEMENT  11/1999   [facility]CA DES[facility]  . IR ANGIO INTRA EXTRACRAN SEL COM CAROTID INNOMINATE UNI R MOD SED  02/15/2017  . IR ANGIO VERTEBRAL SEL SUBCLAVIAN INNOMINATE UNI R MOD SED  02/15/2017  . IR INTRAVSC STENT CERV CAROTID W/O EMB-PROT MOD SED INC ANGIO  02/15/2017  . IR PERCUTANEOUS ART THROMBECTOMY/INFUSION INTRACRANIAL INC DIAG ANGIO  02/15/2017  . IR RADIOLOGIST EVAL & MGMT  05/10/2017  . NECK SURGERY    . WRIST SURGERY      There were no vitals filed for this visit.  Subjective Assessment - 06/06/17 0936    Subjective  "It was not one of the regular falls." Pt  fell sustaining SAH, contusion on Friday.     Currently in Pain?  No/denies            ADULT SLP TREATMENT - 06/06/17 0938      General Information   Behavior/Cognition  Alert;Cooperative;Pleasant mood;Requires cueing    Patient Positioning  Upright in chair      Treatment Provided   Treatment provided  Cognitive-Linquistic      Pain Assessment   Pain Assessment  No/denies pain      Cognitive-Linquistic Treatment   Treatment focused on  Aphasia;Patient/family/caregiver education    Skilled Treatment  SLP targeted both auditory comprehension and verbal expression with card matching tasks. For simple objects from field of 4 with verbal descriptions, pt with success in 5/5 trials. SLP increased level of complexity; pt struggled with ID from description with 4 semantically/ categorically-related items (2/5 with mod cues). Despite this, pt was able to name the 4 pictured items with occasional mod A, first-letter cues. (eg: "dessert with a crust", pt able to name 4/4 dessert items pictured, described "crust" with ? cues, but could not identify "pie" as the dessert with a crust).      Assessment / Recommendations / Plan   Plan  Continue with current plan of care      Progression Toward Goals   Progression toward goals  Progressing toward goals       SLP Education - 06/06/17 1313    Education provided  Yes    Education Details  turn head to right to scan for obstacles, items    Person(s) Educated  Patient;Spouse    Methods  Explanation;Demonstration;Verbal cues    Comprehension  Verbalized understanding;Verbal cues required       SLP Short Term Goals - 06/06/17 1316      SLP SHORT TERM GOAL #1   Title  pt will generate 8 items in a simple category with compensations allowed    Status  Not Met      SLP SHORT TERM GOAL #2   Title  pt will provide sentence level responses using language compensations 80% success    Status  Not Met      SLP SHORT TERM GOAL #3   Title  pt  will demo understanding of 5 minutes mod complex conversation, by providing appropriate responses in conversation 95% over three sessions    Status  Not Met      SLP SHORT TERM GOAL #4   Title  pt will demo appropriate breath support 95% for improvement in voice volume at word level    Status  Deferred       SLP Long Term Goals - 06/06/17 1317      SLP LONG TERM GOAL #1   Title  pt will demo appropriate breath support for adequate speech volume in 10 minutes simple conversation     Time  4    Period  Weeks    Status  On-going      SLP LONG TERM GOAL #2   Title  pt will demo understanding of 8 minutes min-mod complex conversation with request for repeats PRN    Time  4    Period  Weeks    Status  On-going      SLP LONG TERM GOAL #3   Title  Pt will complete mod complex/complex naming tasks with occasional min A    Time  4    Period  Weeks    Status  On-going      SLP LONG TERM GOAL #4   Title  pt will produce 10 minutes simple conversation using compensations for anomia over three sessions    Time  4    Period  Weeks    Status  On-going       Plan - 06/06/17 1314    Clinical Impression Statement  Pt presents today with cont'd expressive aphasia, and some receptive aphasia. Pt continues to ask husband for assistance whether in conversation or with specific speech tasks. Pt does appear to have experienced slight functional decline with fall over the weekend; goals were downgraded last week but remain appropriate at this time. Will monitor progress and adjust goals as needed. Pt will cont to receive skilled ST to address aphasia, dysarthria (volume), and possibly memory should OT not address.     Speech Therapy Frequency  2x / week    Treatment/Interventions  Language facilitation;Internal/external aids;Compensatory techniques;SLP instruction and feedback;Multimodal communcation approach;Cognitive reorganization;Functional tasks;Cueing hierarchy;Compensatory  strategies;Patient/family education    Potential to Achieve Goals  Fair    Potential Considerations  Co-morbidities;Severity of impairments;Ability to learn/carryover information;Previous level of function    Consulted and Agree with Plan of Care  Patient;Family member/caregiver    Family Member Consulted  husband       Patient will benefit from skilled therapeutic intervention in order to improve the  following deficits and impairments:   Aphasia  Dysarthria and anarthria  Cognitive communication deficit    Problem List Patient Active Problem List   Diagnosis Date Noted  . Subarachnoid hemorrhage ([place]) 06/04/2017  . Multiple sclerosis ([place]) 05/12/2017  . CAD (coronary artery disease) 04/12/2017  . Hypertension 04/11/2017  . Right hemiparesis ([place])   . Aspiration pneumonia of right lung ([place])   . Leukocytosis   . Hypokalemia   . Labile blood pressure   . Abnormality of gait as late effect of stroke 02/23/2017  . Dysarthria as late effect of stroke 02/23/2017  . Dysphagia as late effect of stroke 02/23/2017  . Acute ischemic left middle cerebral artery (MCA) stroke ([place]) 02/22/2017  . Paroxysmal A-fib ([place]) 02/17/2017  . Atrial fibrillation with RVR ([place]) 02/17/2017  . HLD (hyperlipidemia) 02/17/2017  . Acute hypoxemic respiratory failure ([place])   . Stroke (cerebrum) ([place]) - Acute MCA infarct due to left CCA/ICA and MCA occlusion, s/p mechanical thrombectomy and left ICA stenting in the setting of newly diagnosed afib 02/14/2017   Deneise Lever, [place], [place] Speech-Language Pathologist  Aliene Altes 06/06/2017, 1:17 PM  [facility] [address] [place], [place], [address] Phone: [phone]   Fax:  [phone]   Name: Lacole Komorowski MRN: [id number] Date of Birth: Oct 17, 1945

## 2017-06-06 NOTE — Therapy (Signed)
[facility] [address] [place], [place], [address] Phone: [phone]   Fax:  [phone]  Occupational Therapy Treatment  Patient Details  Name: Kaitlin [name] MRN: [id number] Date of Birth: 07-13-1946 Referring Provider: Dr. Wynn Banker   Encounter Date: 06/06/2017  OT End of Session - 06/06/17 1115    Visit Number  9    Number of Visits  17    Date for OT Re-Evaluation  07/04/17    Authorization Type  [employer] MCR - G code needed    Authorization - Visit Number  9    Authorization - Number of Visits  10    OT Start Time  1015    OT Stop Time  1100    OT Time Calculation (min)  45 min    Activity Tolerance  Patient tolerated treatment well       Past Medical History:  Diagnosis Date  . Abnormality of gait as late effect of stroke 02/23/2017  . Acute hypoxemic respiratory failure (HCC)   . Atrial fibrillation with RVR (HCC) 02/17/2017  . CAD S/P percutaneous coronary angioplasty 2001  . CVA (cerebral vascular accident) (HCC) 01/2017  . Dysphagia as late effect of stroke 02/23/2017  . HCVD (hypertensive cardiovascular disease) 01/2017   normal LVF with moderate LVH, grade 1 DD  . High cholesterol   . Hypokalemia   . Labile blood pressure   . Leukocytosis   . Multiple sclerosis (HCC)   . PAF (paroxysmal atrial fibrillation) (HCC) 01/2017  . Right hemiparesis [facility])     Past Surgical History:  Procedure Laterality Date  . CORONARY ANGIOPLASTY WITH STENT PLACEMENT  11/1999   RCA DES-[facility]  . IR ANGIO INTRA EXTRACRAN SEL COM CAROTID INNOMINATE UNI R MOD SED  02/15/2017  . IR ANGIO VERTEBRAL SEL SUBCLAVIAN INNOMINATE UNI R MOD SED  02/15/2017  . IR INTRAVSC STENT CERV CAROTID W/O EMB-PROT MOD SED INC ANGIO  02/15/2017  . IR PERCUTANEOUS ART THROMBECTOMY/INFUSION INTRACRANIAL INC DIAG ANGIO  02/15/2017  . IR RADIOLOGIST EVAL & MGMT  05/10/2017  . NECK SURGERY    . WRIST SURGERY      There were no vitals filed for  this visit.  Subjective Assessment - 06/06/17 1020    Subjective   Husband reports pt fell Friday evening 06/03/17, then went to hospital at 1 am on 06/04/17    Patient is accompained by:  Family member husband   husband   Pertinent History  Recent mild SAH from fall 06/03/17, CVA 02/14/17 PMH: MS for approx 20 years (but no exacerbation for 10 years), A-fib, CAD w/ stent in 2000, white matter dz    Patient Stated Goals  to increase balance    Currently in Pain?  No/denies         [facility] OT Assessment - 06/06/17 0001      Coordination   Right 9 Hole Peg Test  59.59 sec 5 sec. decline since fall   5 sec. decline since fall     Hand Function   Right Hand Grip (lbs)  25 lbs 10 lb. decline from assessing last week prior to fall   10 lb. decline from assessing last week prior to fall              OT Treatments/Exercises (OP) - 06/06/17 0001      ADLs   ADL Comments  Pt had recent fall on 06/03/17 with mild SAH and severe bruising around Rt eye and face. Pt  also has bruising around neck. Discussed briefly that even though pt may be safer without walker, pt may need close sup/CGA when walking d/t decreased balance and decr. Rt knee control. Husband reports pt dressing with cues to sit to don pants, and assist for donning Rt brace and shoe.  Pt had elastic laces for Lt shoe      Functional Reaching Activities   High Level  Pt standing at counter with one hand support with wt. shifts to Rt side to address balance while performing visual scanning to look for specific colored pegs in order and retrieving with RUE from high level pegboard on vertical surface to place on bowl on counter. Addressing: dynamic standing balance, wt. shifts to Rt, attention and visual scanning to Rt, and RUE functional use/coordination. Pt required cues to widen BOS frequently. Therapist provided CGA with gait belt. Pt also performed low to mid level reaching with similar set up, however flipping cards, calling out  numbers, and placing cards to Rt side.                OT Short Term Goals - 05/31/17 1325      OT SHORT TERM GOAL #1   Title  Pt/family independent with coordination and putty HEP - 06/04/2017    Time  4    Period  Weeks    Status  Achieved      OT SHORT TERM GOAL #2   Title  Pt/family independent with HEP for RUE shoulder ROM    Time  4    Period  Weeks    Status  Achieved      OT SHORT TERM GOAL #3   Title  Pt/family to verbalize understanding with safety with shower transfers and bathing with DME/AE prn    Time  4    Period  Weeks    Status  Achieved      OT SHORT TERM GOAL #4   Title  Pt/family to verbalize understanding with memory strategies for sequencing with donning shirt/pants, sequencing and safety with use of walker/rollator, and for medication management    Time  4    Period  Weeks    Status  Achieved      OT SHORT TERM GOAL #5   Title  Pt to improve grip strength by 10 lbs Rt hand     Baseline  22 lbs    Time  4    Period  Weeks    Status  Achieved 05/31/2017 R= 35   05/31/2017 R= 35     OT SHORT TERM GOAL #6   Title  Pt to improve coordination Rt hand as evidenced by performing 9 hole peg test to 100 sec. or less    Baseline  2 min. 6 sec    Time  4    Period  Weeks    Status  Achieved 54.25 sec   54.25 sec       OT Long Term Goals - 05/31/17 1325      OT LONG TERM GOAL #1   Title  Pt to retrieve light weight object from high shelf RUE requiring 120 degrees shoulder flexion w/o drops 5/5 trials -07/01/2017    Time  8    Period  Weeks    Status  On-going      OT LONG TERM GOAL #2   Title  Pt to perform simple snack prep and light IADLS (washing dishes, laundry tasks) demo safety with walker w/o LOB  Time  8    Period  Weeks    Status  On-going      OT LONG TERM GOAL #3   Title  Pt to implement memory strategies into ADLS including sequencing of donning clothes, walker negotiation, and medication management with supervision and no  more than min cues    Time  8    Period  Weeks    Status  On-going      OT LONG TERM GOAL #4   Title  Pt to improve coordination Rt hand as evidenced by performing 9 hole peg test in 45 sec. or less    Baseline  2 min. 6 sec    Time  8    Period  Weeks    Status  Revised      OT LONG TERM GOAL #5   Title  Pt to tie shoes and style hair mod I level with A/E prn    Time  8    Period  Weeks    Status  On-going            Plan - 06/06/17 1116    Clinical Impression Statement  Pt with slight decline in function since fall and hospital admission, however POC still appropriate.     Rehab Potential  Fair    Current Impairments/barriers affecting progress:  severity of deficits including aphasia, cognition    OT Frequency  2x / week    OT Duration  8 weeks    OT Treatment/Interventions  Self-care/ADL training;DME and/or AE instruction;Patient/family education;Therapeutic exercises;Therapeutic activities;Functional Mobility Training;Passive range of motion;Cognitive remediation/compensation;Visual/perceptual remediation/compensation;Manual Therapy;Moist Heat    Plan  G-code and PN, balance w/o walker, continue use of Rt hand w/ focus on apraxia, functional reaching. Incorporate cognition as able.     Consulted and Agree with Plan of Care  Patient;Family member/caregiver    Family Member Consulted  husband-[name]       Patient will benefit from skilled therapeutic intervention in order to improve the following deficits and impairments:  Decreased coordination, Decreased range of motion, Impaired flexibility, Impaired sensation, Decreased safety awareness, Decreased knowledge of precautions, Impaired tone, Impaired UE functional use, Decreased knowledge of use of DME, Decreased mobility, Decreased balance, Decreased cognition, Decreased strength, Impaired perceived functional ability, Impaired vision/preception  Visit Diagnosis: Hemiplegia and hemiparesis following cerebral infarction  affecting right non-dominant side (HCC)  Unsteadiness on feet  Apraxia  Other symptoms and signs involving cognitive functions following cerebral infarction    Problem List Patient Active Problem List   Diagnosis Date Noted  . Subarachnoid hemorrhage (HCC) 06/04/2017  . Multiple sclerosis (HCC) 05/12/2017  . CAD (coronary artery disease) 04/12/2017  . Hypertension 04/11/2017  . Right hemiparesis (HCC)   . Aspiration pneumonia of right lung (HCC)   . Leukocytosis   . Hypokalemia   . Labile blood pressure   . Abnormality of gait as late effect of stroke 02/23/2017  . Dysarthria as late effect of stroke 02/23/2017  . Dysphagia as late effect of stroke 02/23/2017  . Acute ischemic left middle cerebral artery (MCA) stroke (HCC) 02/22/2017  . Paroxysmal A-fib (HCC) 02/17/2017  . Atrial fibrillation with RVR (HCC) 02/17/2017  . HLD (hyperlipidemia) 02/17/2017  . Acute hypoxemic respiratory failure (HCC)   . Stroke (cerebrum) (HCC) - Acute MCA infarct due to left CCA/ICA and MCA occlusion, s/p mechanical thrombectomy and left ICA stenting in the setting of newly diagnosed afib 02/14/2017    Kelli Churn, OTR/L 06/06/2017, 11:21 AM  [facility] [address] [place], [place], [address] Phone: [phone]   Fax:  [phone]  Name: Kaitlin [name] MRN: [id number] Date of Birth: Jun 12, 1946

## 2017-06-08 ENCOUNTER — Encounter: Payer: Self-pay | Admitting: Physical Therapy

## 2017-06-08 ENCOUNTER — Ambulatory Visit: Payer: Medicare Other | Admitting: Occupational Therapy

## 2017-06-08 ENCOUNTER — Ambulatory Visit: Payer: Medicare Other

## 2017-06-08 ENCOUNTER — Ambulatory Visit: Payer: Medicare Other | Admitting: Physical Therapy

## 2017-06-08 DIAGNOSIS — R2681 Unsteadiness on feet: Secondary | ICD-10-CM

## 2017-06-08 DIAGNOSIS — R41841 Cognitive communication deficit: Secondary | ICD-10-CM

## 2017-06-08 DIAGNOSIS — M6281 Muscle weakness (generalized): Secondary | ICD-10-CM | POA: Diagnosis not present

## 2017-06-08 DIAGNOSIS — R471 Dysarthria and anarthria: Secondary | ICD-10-CM

## 2017-06-08 DIAGNOSIS — R4701 Aphasia: Secondary | ICD-10-CM

## 2017-06-08 DIAGNOSIS — I69318 Other symptoms and signs involving cognitive functions following cerebral infarction: Secondary | ICD-10-CM

## 2017-06-08 DIAGNOSIS — R482 Apraxia: Secondary | ICD-10-CM

## 2017-06-08 DIAGNOSIS — I69353 Hemiplegia and hemiparesis following cerebral infarction affecting right non-dominant side: Secondary | ICD-10-CM

## 2017-06-08 DIAGNOSIS — R2689 Other abnormalities of gait and mobility: Secondary | ICD-10-CM

## 2017-06-08 NOTE — Therapy (Signed)
[facility] [address] [place], [place], [address] Phone: [phone]   Fax:  [phone]  Occupational Therapy Treatment  Patient Details  Name: Kaitlin [name] MRN: [id number] Date of Birth: [birth year] Referring Provider: Dr. Wynn Banker   Encounter Date: 06/08/2017  OT End of Session - 06/08/17 1407    Visit Number  10    Number of Visits  17    Date for OT Re-Evaluation  07/04/17    Authorization Type  U[employer] MCR - G code needed    Authorization - Visit Number  10    Authorization - Number of Visits  10    OT Start Time  1318    OT Stop Time  1400    OT Time Calculation (min)  42 min    Activity Tolerance  Patient tolerated treatment well    Behavior During Therapy  [facility] for tasks assessed/performed       Past Medical History:  Diagnosis Date  . Abnormality of gait as late effect of stroke 02/23/2017  . Acute hypoxemic respiratory failure (HCC)   . Atrial fibrillation with RVR (HCC) 02/17/2017  . CAD S/P percutaneous coronary angioplasty 2001  . CVA (cerebral vascular accident) (HCC) 01/2017  . Dysphagia as late effect of stroke 02/23/2017  . HCVD (hypertensive cardiovascular disease) 01/2017   normal LVF with moderate LVH, grade 1 DD  . High cholesterol   . Hypokalemia   . Labile blood pressure   . Leukocytosis   . Multiple sclerosis (HCC)   . PAF (paroxysmal atrial fibrillation) (HCC) 01/2017  . Right hemiparesis [facility])     Past Surgical History:  Procedure Laterality Date  . CORONARY ANGIOPLASTY WITH STENT PLACEMENT  11/1999   RCA DES[facility]  . IR ANGIO INTRA EXTRACRAN SEL COM CAROTID INNOMINATE UNI R MOD SED  02/15/2017  . IR ANGIO VERTEBRAL SEL SUBCLAVIAN INNOMINATE UNI R MOD SED  02/15/2017  . IR INTRAVSC STENT CERV CAROTID W/O EMB-PROT MOD SED INC ANGIO  02/15/2017  . IR PERCUTANEOUS ART THROMBECTOMY/INFUSION INTRACRANIAL INC DIAG ANGIO  02/15/2017  . IR RADIOLOGIST EVAL & MGMT  05/10/2017  . NECK  SURGERY    . WRIST SURGERY      There were no vitals filed for this visit.  Subjective Assessment - 06/08/17 1320    Pertinent History  Recent mild SAH from fall 06/03/17, CVA 02/14/17 PMH: MS for approx 20 years (but no exacerbation for 10 years), A-fib, CAD w/ stent in 2000, white matter dz    Patient Stated Goals  to increase balance    Currently in Pain?  No/denies                   OT Treatments/Exercises (OP) - 06/08/17 0001      Shoulder Exercises: ROM/Strengthening   Other ROM/Strengthening Exercises  Reviewed cane HEP - pt demo each x 5 reps with min to mod cues to perform correctly. Pt/husband instructed to do in front of mirror seated. Pt will need close sup for last ex in standing.       Fine Motor Coordination   Other Fine Motor Exercises  Pt manipulating highlighter b/t first 3 digits w/o difficulty. Pt manipulating stress balls and rotation card in Rt hand for coordination and apraxia. Pt required mod assist to rotate stress balls, and cueing for rotating card completely around. Pt also manipulating block in fingertips in numerical order with mod difficulty       Standing at counter to  include balance while stringing pegs for bilateral integration and Rt hand coordination. Pt also had to scan to Rt side to follow color pattern for cognitive component with min cues requires to stay on pattern when distracted. If not distracted, pt could follow pattern        OT Short Term Goals - 05/31/17 1325      OT SHORT TERM GOAL #1   Title  Pt/family independent with coordination and putty HEP - 06/04/2017    Time  4    Period  Weeks    Status  Achieved      OT SHORT TERM GOAL #2   Title  Pt/family independent with HEP for RUE shoulder ROM    Time  4    Period  Weeks    Status  Achieved      OT SHORT TERM GOAL #3   Title  Pt/family to verbalize understanding with safety with shower transfers and bathing with DME/AE prn    Time  4    Period  Weeks    Status   Achieved      OT SHORT TERM GOAL #4   Title  Pt/family to verbalize understanding with memory strategies for sequencing with donning shirt/pants, sequencing and safety with use of walker/rollator, and for medication management    Time  4    Period  Weeks    Status  Achieved      OT SHORT TERM GOAL #5   Title  Pt to improve grip strength by 10 lbs Rt hand     Baseline  22 lbs    Time  4    Period  Weeks    Status  Achieved 05/31/2017 R= 35   05/31/2017 R= 35     OT SHORT TERM GOAL #6   Title  Pt to improve coordination Rt hand as evidenced by performing 9 hole peg test to 100 sec. or less    Baseline  2 min. 6 sec    Time  4    Period  Weeks    Status  Achieved 54.25 sec   54.25 sec       OT Long Term Goals - 05/31/17 1325      OT LONG TERM GOAL #1   Title  Pt to retrieve light weight object from high shelf RUE requiring 120 degrees shoulder flexion w/o drops 5/5 trials -07/01/2017    Time  8    Period  Weeks    Status  On-going      OT LONG TERM GOAL #2   Title  Pt to perform simple snack prep and light IADLS (washing dishes, laundry tasks) demo safety with walker w/o LOB    Time  8    Period  Weeks    Status  On-going      OT LONG TERM GOAL #3   Title  Pt to implement memory strategies into ADLS including sequencing of donning clothes, walker negotiation, and medication management with supervision and no more than min cues    Time  8    Period  Weeks    Status  On-going      OT LONG TERM GOAL #4   Title  Pt to improve coordination Rt hand as evidenced by performing 9 hole peg test in 45 sec. or less    Baseline  2 min. 6 sec    Time  8    Period  Weeks    Status  Revised  OT LONG TERM GOAL #5   Title  Pt to tie shoes and style hair mod I level with A/E prn    Time  8    Period  Weeks    Status  On-going            Plan - 06/08/17 1408    Clinical Impression Statement  Pt making slow progress with Rt hand coordination, but limited by Rt neglect  and apraxia    Rehab Potential  Fair    Current Impairments/barriers affecting progress:  severity of deficits including aphasia, cognition    OT Frequency  2x / week    OT Duration  8 weeks    OT Treatment/Interventions  Self-care/ADL training;DME and/or AE instruction;Patient/family education;Therapeutic exercises;Therapeutic activities;Functional Mobility Training;Passive range of motion;Cognitive remediation/compensation;Visual/perceptual remediation/compensation;Manual Therapy;Moist Heat    Plan  continue balance, RUE functional use, cognition as able    Consulted and Agree with Plan of Care  Patient;Family member/caregiver    Family Member Consulted  husband-[name]       Patient will benefit from skilled therapeutic intervention in order to improve the following deficits and impairments:  Decreased coordination, Decreased range of motion, Impaired flexibility, Impaired sensation, Decreased safety awareness, Decreased knowledge of precautions, Impaired tone, Impaired UE functional use, Decreased knowledge of use of DME, Decreased mobility, Decreased balance, Decreased cognition, Decreased strength, Impaired perceived functional ability, Impaired vision/preception  Visit Diagnosis: Hemiplegia and hemiparesis following cerebral infarction affecting right non-dominant side (HCC)  Apraxia  Other symptoms and signs involving cognitive functions following cerebral infarction  Unsteadiness on feet  G-Codes - 06/08/17 1410    Functional Assessment Tool Used (Outpatient only)  RUE function based on 9 hole peg test, grip strength, inattention to Rt side    Functional Limitation  Carrying, moving and handling objects    Carrying, Moving and Handling Objects Current Status ([id number])  At least 40 percent but less than 60 percent impaired, limited or restricted    Carrying, Moving and Handling Objects Goal Status ([id number])  At least 20 percent but less than 40 percent impaired, limited or restricted        Problem List Patient Active Problem List   Diagnosis Date Noted  . Subarachnoid hemorrhage (HCC) 06/04/2017  . Multiple sclerosis (HCC) 05/12/2017  . CAD (coronary artery disease) 04/12/2017  . Hypertension 04/11/2017  . Right hemiparesis (HCC)   . Aspiration pneumonia of right lung (HCC)   . Leukocytosis   . Hypokalemia   . Labile blood pressure   . Abnormality of gait as late effect of stroke 02/23/2017  . Dysarthria as late effect of stroke 02/23/2017  . Dysphagia as late effect of stroke 02/23/2017  . Acute ischemic left middle cerebral artery (MCA) stroke (HCC) 02/22/2017  . Paroxysmal A-fib (HCC) 02/17/2017  . Atrial fibrillation with RVR (HCC) 02/17/2017  . HLD (hyperlipidemia) 02/17/2017  . Acute hypoxemic respiratory failure (HCC)   . Stroke (cerebrum) (HCC) - Acute MCA infarct due to left CCA/ICA and MCA occlusion, s/p mechanical thrombectomy and left ICA stenting in the setting of newly diagnosed afib 02/14/2017    Occupational Therapy Progress Note  Dates of Reporting Period: 05/05/17 to 06/08/17  Objective Reports of Subjective Statement: Pt's husband reports she is using hand better if cued to use Rt hand  Objective Measurements: Grip = 25 lbs after fall, 35 lbs before fall. 9 hole peg test : 59.59 sec.   Goal Update: see above  Plan: see above  Reason Skilled Services are Required:  continued skilled OT required to address apraxia, Rt hand coordination, safety with ADLS and balance   Kelli Churn, OTR/L  06/08/2017, 2:13 PM  [facility] [address] [place], [place], [address] Phone: [phone]   Fax:  [phone]  Name: Kaitlin [name] MRN: [id number] Date of Birth: [birth year]

## 2017-06-08 NOTE — Therapy (Signed)
[facility] [address] [place], [place], [address] Phone: [phone]   Fax:  [phone]  Physical Therapy Treatment  Patient Details  Name: Kaitlin [name] MRN: [id number] Date of Birth: Nov 08, 1945 Referring Provider: Alysia Penna MD   Encounter Date: 06/08/2017  PT End of Session - 06/08/17 1418    Visit Number  9    Number of Visits  17    Date for PT Re-Evaluation  07/04/17    Authorization Type  [employer] MCR    Authorization Time Period  05/05/17  to  07/04/17    PT Start Time  1236    PT Stop Time  1315    PT Time Calculation (min)  39 min    Equipment Utilized During Treatment  Gait belt    Activity Tolerance  Patient tolerated treatment well    Behavior During Therapy  [facility] for tasks assessed/performed       Past Medical History:  Diagnosis Date  . Abnormality of gait as late effect of stroke 02/23/2017  . Acute hypoxemic respiratory failure ([place])   . Atrial fibrillation with RVR ([place]) 02/17/2017  . CAD S/P percutaneous coronary angioplasty 2001  . CVA (cerebral vascular accident) ([place]) 01/2017  . Dysphagia as late effect of stroke 02/23/2017  . HCVD (hypertensive cardiovascular disease) 01/2017   normal LVF with moderate LVH, grade 1 DD  . High cholesterol   . Hypokalemia   . Labile blood pressure   . Leukocytosis   . Multiple sclerosis ([place])   . PAF (paroxysmal atrial fibrillation) ([place]) 01/2017  . Right hemiparesis [facility])     Past Surgical History:  Procedure Laterality Date  . CORONARY ANGIOPLASTY WITH STENT PLACEMENT  11/1999   RCA DES-[facility]  . IR ANGIO INTRA EXTRACRAN SEL COM CAROTID INNOMINATE UNI R MOD SED  02/15/2017  . IR ANGIO VERTEBRAL SEL SUBCLAVIAN INNOMINATE UNI R MOD SED  02/15/2017  . IR INTRAVSC STENT CERV CAROTID W/O EMB-PROT MOD SED INC ANGIO  02/15/2017  . IR PERCUTANEOUS ART THROMBECTOMY/INFUSION INTRACRANIAL INC DIAG ANGIO  02/15/2017  . IR RADIOLOGIST EVAL & MGMT  05/10/2017  .  NECK SURGERY    . WRIST SURGERY      There were no vitals filed for this visit.  Subjective Assessment - 06/08/17 1240    Subjective  No new complaints, no new falls since last session, no pain to report    Patient is accompained by:  Family member    Patient Stated Goals  "Standing up straight; getting my self-control" (balance)    Currently in Pain?  No/denies        [facility] Adult PT Treatment/Exercise - 06/08/17 0001      Ambulation/Gait   Ambulation/Gait  Yes    Ambulation/Gait Assistance  4: Min assist;3: Mod assist    Ambulation/Gait Assistance Details  Pt ambulated 1 lap around blue circle initially with the shoes she wears at home with no brace. Pt presented with poor foot clearance on the RLE with no brace and required min/mod assist as she fatigued. Re donned shoes with toe cap and brace for remainder of session. During ambulation the pt was asked to name a food starting with each letter of the alphabet, Pt had difficulty with this task and only reached letter "F" after walking the full circle.     Ambulation Distance (Feet)  515 Feet 115, 300, 500 ;1 seated rest break in between each   115, 300, 500 ;1 seated rest break  in between each   Assistive device  None    Gait Pattern  Step-through pattern;Decreased dorsiflexion - right;Decreased hip/knee flexion - right;Poor foot clearance - right;Decreased stride length    Ambulation Surface  Level;Indoor      Balance   Balance Assessed  Yes      High Level Balance   High Level Balance Activities  Tandem walking;Marching forwards;Marching backwards;Side stepping    High Level Balance Comments  Pt in front of counter with single UE support on red mat fwds/bkwds tandem walking and marching. Pt had moderate loss of balance with tandem and required multiple cues to advance knees higher during tandem walking. Pt side stepping in front of counter with BUE support on red mat while tapping cones and alt. LE'S. Cues for posture and to side  step until she lines up with each cone.       Neuro Re-ed    Neuro Re-ed Details   Pt walking up/down hall while performing head movements: head turns/head nods/ diagonals both ways, Pt demonstrated signifigant drifting to the right with head turns and both diagonals and required multiple cues to look up with head and not just eyes with head nods.      Knee/Hip Exercises: Standing   Hip Abduction  AROM;Stengthening;Both;10 reps;Knee straight;Limitations    Abduction Limitations  Pt standing at counter with BUE support, cues to keep shoulder straight to avoid a lean and to look straight ahead rather than feet.     Hip Extension  AROM;Stengthening;Both;10 reps;Knee straight;Limitations    Extension Limitations  Pt standing at counter with BUE support, cues for posture and to avoid a forwards lean with each rep.  Pt having some difficulty clearing RLE from floor with extension.     Other Standing Knee Exercises  Pt standing at counter with single UE support performing standing marches. Cues for posture and to being knees up higher.         PT Short Term Goals - 06/08/17 1418      PT SHORT TERM GOAL #1   Title  Patient (with supervision to min-guard assistance of her spouse) will perform basic HEP for balance and strengthening. (Target STGs 06/04/17)    Baseline  05/30/17: goals achieved    Time  4    Period  Weeks    Status  Achieved      PT SHORT TERM GOAL #2   Title  Patient will complete FGA with STG and LTG set as appropriate.    Baseline  10/10 FGA 9/30; 10/16 goal set    Time  1    Period  Weeks    Status  Achieved      PT SHORT TERM GOAL #3   Title  Patient will improve TUG with or without a device to <15 seconds to demonstrate a lesser fall risk.     Baseline  rollator 18.25; no device 17.81 sec. 05/30/17: No AD 18.10/ RW 26.59    Time  4    Period  Weeks    Status  Not Met      PT SHORT TERM GOAL #4   Title  Patient will improve her gait velocity to 2.62 ft/sec to indicate  lesser fall risk with community ambulation.     Baseline  05/30/17: 2.04 ft/sec without AD 1.62f/sec with AD    Time  4    Period  Weeks    Status  Not Met      PT SHORT TERM GOAL #5  Title  Patient will improve FGA score to >=14/30 to demonstrate progress towards lesser fall risk    Baseline  05/31/17: Pt improved score to 16/30    Time  3    Period  Weeks    Status  Achieved        PT Long Term Goals - 06/08/17 1418      PT LONG TERM GOAL #1   Title  Patient and/or husband will be able to verbalize signs and symptoms of CVA and appropriate action to take. (Target LTGs: 07/04/17)    Time  8    Period  Weeks    Status  New      PT LONG TERM GOAL #2   Title  Patient (with supervision of husband) will perform updated HEP    Time  8    Period  Weeks    Status  New      PT LONG TERM GOAL #3   Title  Patient will ambulate >=2.79 ft/sec (age based norm) with LRAD or no device on level surface.    Time  8    Period  Weeks    Status  New      PT LONG TERM GOAL #4   Title  Patient will ambulate >= 1000 ft on outdoor surfaces (level and unlevel, grass, gravel, mulch) with LRAD and supervision.     Time  8    Period  Weeks    Status  New      PT LONG TERM GOAL #5   Title  Patient will perform TUG <13.5 seconds to demonstrate lesser fall risk.    Time  8    Period  Weeks    Status  New      PT LONG TERM GOAL #6   Title  Patient will improve FGA score by 4 points above her score assessed at STGs (TBA).    Baseline  10/16 goal set per STG set on eval    Time  7    Period  Weeks    Status  New         Plan - 06/08/17 1421    Clinical Impression Statement  Pt tolerated treatment well with no limitations due to pain or fatigue. Pt's husband reports that he bought a seat belt for pts W/C but hasn't assembled it yet. Pt brought in the shoes she wears at home without a brace and walked a short distance in them, pt began demonstrating poor foot clearance after 50 ft. Todays  session focused on LE strengthening, balance training and COG challenge with gait. Pt would benefit from continued PT sessions.     Rehab Potential  Good    Clinical Impairments Affecting Rehab Potential  impulsivity and decr awareness of deficits    PT Frequency  2x / week    PT Duration  8 weeks    PT Treatment/Interventions  ADLs/Self Care Home Management;DME Instruction;Gait training;Stair training;Functional mobility training;Orthotic Fit/Training;Patient/family education;Cognitive remediation;Neuromuscular re-education;Balance training;Therapeutic exercise;Therapeutic activities    PT Next Visit Plan  Continue to work on gait training without AD, weight shifting tasks, LE strengthening balance on compliant surfaces and COG challanges.     Consulted and Agree with Plan of Care  Patient;Family member/caregiver    Family Member Consulted  spouse, [name]       Patient will benefit from skilled therapeutic intervention in order to improve the following deficits and impairments:  Abnormal gait, Decreased balance, Decreased cognition, Decreased knowledge of use of  DME, Decreased mobility, Decreased safety awareness, Decreased strength, Difficulty walking, Impaired perceived functional ability, Impaired sensation, Impaired UE functional use  Visit Diagnosis: Muscle weakness (generalized)  Unsteadiness on feet  Other abnormalities of gait and mobility     Problem List Patient Active Problem List   Diagnosis Date Noted  . Subarachnoid hemorrhage ([place]) 06/04/2017  . Multiple sclerosis ([place]) 05/12/2017  . CAD (coronary artery disease) 04/12/2017  . Hypertension 04/11/2017  . Right hemiparesis ([place])   . Aspiration pneumonia of right lung ([place])   . Leukocytosis   . Hypokalemia   . Labile blood pressure   . Abnormality of gait as late effect of stroke 02/23/2017  . Dysarthria as late effect of stroke 02/23/2017  . Dysphagia as late effect of stroke 02/23/2017  . Acute ischemic left middle  cerebral artery (MCA) stroke ([employer]) 02/22/2017  . Paroxysmal A-fib ([place]) 02/17/2017  . Atrial fibrillation with RVR ([place]) 02/17/2017  . HLD (hyperlipidemia) 02/17/2017  . Acute hypoxemic respiratory failure ([place])   . Stroke (cerebrum) ([place]) - Acute MCA infarct due to left CCA/ICA and MCA occlusion, s/p mechanical thrombectomy and left ICA stenting in the setting of newly diagnosed afib 02/14/2017   Geneveive Furness, SPTA  [place] 06/08/2017, 2:28 PM  [facility] [address] [place], [place], [address] Phone: [phone]   Fax:  [phone]  Name: Kaitlin [name] MRN: [id number] Date of Birth: June 01, 1946

## 2017-06-09 NOTE — Patient Instructions (Signed)
  Please complete the assigned speech therapy homework prior to your next session and return it to the speech therapist at your next visit.  

## 2017-06-09 NOTE — Therapy (Signed)
[name] [address] [place], [place], [address] Phone: [phone]   Fax:  [phone]  Speech Language Pathology Treatment  Patient Details  Name: Kaitlin [name] MRN: [id number] Date of Birth: 1946-04-25 Referring Provider: Alysia Penna, MD   Encounter Date: 06/08/2017  End of Session - 06/09/17 0903    Visit Number  9    Number of Visits  17    Date for SLP Re-Evaluation  07/11/17    SLP Start Time  1149    SLP Stop Time   1231    SLP Time Calculation (min)  42 min    Activity Tolerance  Patient tolerated treatment well       Past Medical History:  Diagnosis Date  . Abnormality of gait as late effect of stroke 02/23/2017  . Acute hypoxemic respiratory failure ([place])   . Atrial fibrillation with RVR ([place]) 02/17/2017  . CAD S/P percutaneous coronary angioplasty 2001  . CVA (cerebral vascular accident) ([place]) 01/2017  . Dysphagia as late effect of stroke 02/23/2017  . HCVD (hypertensive cardiovascular disease) 01/2017   normal LVF with moderate LVH, grade 1 DD  . High cholesterol   . Hypokalemia   . Labile blood pressure   . Leukocytosis   . Multiple sclerosis ([place])   . PAF (paroxysmal atrial fibrillation) ([place]) 01/2017  . Right hemiparesis [facility])     Past Surgical History:  Procedure Laterality Date  . CORONARY ANGIOPLASTY WITH STENT PLACEMENT  11/1999   [facility]CA DES-[facility]  . IR ANGIO INTRA EXTRACRAN SEL COM CAROTID INNOMINATE UNI R MOD SED  02/15/2017  . IR ANGIO VERTEBRAL SEL SUBCLAVIAN INNOMINATE UNI R MOD SED  02/15/2017  . IR INTRAVSC STENT CERV CAROTID W/O EMB-PROT MOD SED INC ANGIO  02/15/2017  . IR PERCUTANEOUS ART THROMBECTOMY/INFUSION INTRACRANIAL INC DIAG ANGIO  02/15/2017  . IR RADIOLOGIST EVAL & MGMT  05/10/2017  . NECK SURGERY    . WRIST SURGERY      There were no vitals filed for this visit.  Subjective Assessment - 06/08/17 1156    Subjective  "I did." (pt response to SLP comment, "You  look like you took a fall")    Patient is accompained by:  -- husband, [name]   husband, [name]   Currently in Pain?  No/denies            ADULT SLP TREATMENT - 06/09/17 0001      General Information   Behavior/Cognition  Alert;Cooperative;Pleasant mood;Requires cueing      Treatment Provided   Treatment provided  Cognitive-Linquistic      Cognitive-Linquistic Treatment   Treatment focused on  Aphasia;Apraxia    Skilled Treatment  Auditory comprehension targeted first today. With descriptions provided pt ID'd picture f:4 57% success (min-mod complex descriptions). Pt read paragraph with one aphasic error ("aisle style" instead of "aisle"). She imitated aritulatory complex phrases and tongue twisters 100%. SLP asked pt to desribe two pictures from the 15 she had just talked about with SLP and pt req'd max cues for recall of even the most recent set of pictures used for picture ID with descriptions.      Assessment / Recommendations / Plan   Plan  Continue with current plan of care      Progression Toward Goals   Progression toward goals  Progressing toward goals         SLP Short Term Goals - 06/06/17 1316      SLP SHORT TERM GOAL #1  Title  pt will generate 8 items in a simple category with compensations allowed    Status  Not Met      SLP SHORT TERM GOAL #2   Title  pt will provide sentence level responses using language compensations 80% success    Status  Not Met      SLP SHORT TERM GOAL #3   Title  pt will demo understanding of 5 minutes mod complex conversation, by providing appropriate responses in conversation 95% over three sessions    Status  Not Met      SLP SHORT TERM GOAL #4   Title  pt will demo appropriate breath support 95% for improvement in voice volume at word level    Status  Deferred       SLP Long Term Goals - 06/09/17 0905      SLP LONG TERM GOAL #1   Title  pt will demo appropriate breath support for adequate speech volume in 10 minutes  simple conversation     Time  4    Period  Weeks    Status  On-going      SLP LONG TERM GOAL #2   Title  pt will demo understanding of 8 minutes min-mod complex conversation with request for repeats PRN    Time  4    Period  Weeks    Status  On-going      SLP LONG TERM GOAL #3   Title  Pt will complete mod complex/complex naming tasks with occasional min A    Time  4    Period  Weeks    Status  On-going      SLP LONG TERM GOAL #4   Title  pt will produce 10 minutes simple conversation using compensations for anomia over three sessions    Time  4    Period  Weeks    Status  On-going       Plan - 06/09/17 0904    Clinical Impression Statement  Pt presents today with cont'd expressive aphasia, and some receptive aphasia. See "skilled intervention" for details. Pt will cont to receive skilled ST to address aphasia, and dysarthria (volume).    Speech Therapy Frequency  2x / week    Treatment/Interventions  Language facilitation;Internal/external aids;Compensatory techniques;SLP instruction and feedback;Multimodal communcation approach;Cognitive reorganization;Functional tasks;Cueing hierarchy;Compensatory strategies;Patient/family education    Potential to Achieve Goals  Fair    Potential Considerations  Co-morbidities;Severity of impairments;Ability to learn/carryover information;Previous level of function    Consulted and Agree with Plan of Care  Patient;Family member/caregiver    Family Member Consulted  husband       Patient will benefit from skilled therapeutic intervention in order to improve the following deficits and impairments:   Aphasia  Dysarthria and anarthria  Cognitive communication deficit    Problem List Patient Active Problem List   Diagnosis Date Noted  . Subarachnoid hemorrhage ([place]) 06/04/2017  . Multiple sclerosis ([place]) 05/12/2017  . CAD (coronary artery disease) 04/12/2017  . Hypertension 04/11/2017  . Right hemiparesis ([place])   . Aspiration  pneumonia of right lung ([place])   . Leukocytosis   . Hypokalemia   . Labile blood pressure   . Abnormality of gait as late effect of stroke 02/23/2017  . Dysarthria as late effect of stroke 02/23/2017  . Dysphagia as late effect of stroke 02/23/2017  . Acute ischemic left middle cerebral artery (MCA) stroke ([place]) 02/22/2017  . Paroxysmal A-fib ([place]) 02/17/2017  . Atrial fibrillation with RVR (  [place]) 02/17/2017  . HLD (hyperlipidemia) 02/17/2017  . Acute hypoxemic respiratory failure ([place])   . Stroke (cerebrum) ([place]) - Acute MCA infarct due to left CCA/ICA and MCA occlusion, s/p mechanical thrombectomy and left ICA stenting in the setting of newly diagnosed afib 02/14/2017    [facility] ,MS, CCC-SLP  06/09/2017, 9:06 AM  [facility] [address] [place], [place], [address] Phone: [phone]   Fax:  [phone]   Name: Shaleah Nissley MRN: [id number] Date of Birth: 16-Jun-1946

## 2017-06-13 ENCOUNTER — Encounter: Payer: Self-pay | Admitting: Physical Therapy

## 2017-06-13 ENCOUNTER — Ambulatory Visit: Payer: Medicare Other | Admitting: Occupational Therapy

## 2017-06-13 ENCOUNTER — Ambulatory Visit: Payer: Medicare Other | Admitting: Physical Therapy

## 2017-06-13 ENCOUNTER — Other Ambulatory Visit: Payer: Self-pay

## 2017-06-13 ENCOUNTER — Ambulatory Visit: Payer: Medicare Other

## 2017-06-13 DIAGNOSIS — M6281 Muscle weakness (generalized): Secondary | ICD-10-CM | POA: Diagnosis not present

## 2017-06-13 DIAGNOSIS — R482 Apraxia: Secondary | ICD-10-CM

## 2017-06-13 DIAGNOSIS — R4701 Aphasia: Secondary | ICD-10-CM

## 2017-06-13 DIAGNOSIS — I69353 Hemiplegia and hemiparesis following cerebral infarction affecting right non-dominant side: Secondary | ICD-10-CM

## 2017-06-13 DIAGNOSIS — I69318 Other symptoms and signs involving cognitive functions following cerebral infarction: Secondary | ICD-10-CM

## 2017-06-13 DIAGNOSIS — R278 Other lack of coordination: Secondary | ICD-10-CM

## 2017-06-13 DIAGNOSIS — R41841 Cognitive communication deficit: Secondary | ICD-10-CM

## 2017-06-13 DIAGNOSIS — R2689 Other abnormalities of gait and mobility: Secondary | ICD-10-CM

## 2017-06-13 DIAGNOSIS — R471 Dysarthria and anarthria: Secondary | ICD-10-CM

## 2017-06-13 DIAGNOSIS — R2681 Unsteadiness on feet: Secondary | ICD-10-CM

## 2017-06-13 NOTE — Therapy (Signed)
[facility] [address] [place], [place], [address] Phone: [phone]   Fax:  [phone]  Occupational Therapy Treatment  Patient Details  Name: Kaitlin [name] MRN: [id number] Date of Birth: 08/24/1945 Referring Provider: Dr. Letta Pate   Encounter Date: 06/13/2017  OT End of Session - 06/13/17 1227    Visit Number  11    Number of Visits  17    Date for OT Re-Evaluation  07/04/17    Authorization Type  UHC MCR - G code needed    Authorization - Visit Number  11    Authorization - Number of Visits  20    OT Start Time  1100    OT Stop Time  1145    OT Time Calculation (min)  45 min    Activity Tolerance  Patient tolerated treatment well       Past Medical History:  Diagnosis Date  . Abnormality of gait as late effect of stroke 02/23/2017  . Acute hypoxemic respiratory failure ([place])   . Atrial fibrillation with RVR ([place]) 02/17/2017  . CAD S/P percutaneous coronary angioplasty 2001  . CVA (cerebral vascular accident) ([place]) 01/2017  . Dysphagia as late effect of stroke 02/23/2017  . HCVD (hypertensive cardiovascular disease) 01/2017   normal LVF with moderate LVH, grade 1 DD  . High cholesterol   . Hypokalemia   . Labile blood pressure   . Leukocytosis   . Multiple sclerosis ([place])   . PAF (paroxysmal atrial fibrillation) ([place]) 01/2017  . Right hemiparesis [facility])     Past Surgical History:  Procedure Laterality Date  . CORONARY ANGIOPLASTY WITH STENT PLACEMENT  11/1999   RCA DES[facility]  . IR ANGIO INTRA EXTRACRAN SEL COM CAROTID INNOMINATE UNI R MOD SED  02/15/2017  . IR ANGIO VERTEBRAL SEL SUBCLAVIAN INNOMINATE UNI R MOD SED  02/15/2017  . IR INTRAVSC STENT CERV CAROTID W/O EMB-PROT MOD SED INC ANGIO  02/15/2017  . IR PERCUTANEOUS ART THROMBECTOMY/INFUSION INTRACRANIAL INC DIAG ANGIO  02/15/2017  . IR RADIOLOGIST EVAL & MGMT  05/10/2017  . NECK SURGERY    . WRIST SURGERY      There were no vitals filed for  this visit.  Subjective Assessment - 06/13/17 1107    Subjective   She did well in P.T. and did a lot of walking (per husband report)     Patient is accompained by:  Family member    Pertinent History  Recent mild SAH from fall 06/03/17, CVA 02/14/17 PMH: MS for approx 20 years (but no exacerbation for 10 years), A-fib, CAD w/ stent in 2000, white matter dz    Patient Stated Goals  to increase balance    Currently in Pain?  No/denies                   OT Treatments/Exercises (OP) - 06/13/17 0001      ADLs   Grooming  Discussed SAFE options for styling hair     LB Dressing  Pt demo untying and tying Rt shoe    ADL Comments  Re-inforced tasks that require both hands (in seated position or with CGA for balance in standing) including: tying shoes, folding laundry, flossing, etc.       Hand Exercises   Other Hand Exercises  Seated: Flipping large cards over with Rt hand while adding last 2 numbers together (jacks, queens, kings, jokers = 10 pts, aces = 15 pts) with mod to max v.c's to remember what Aces  represent, to remember rules, and for more difficult math. Max cueing t/o task to use Rt non dominant hand.       Functional Reaching Activities   High Level  High level reaching RUE to place large pegs in pegboard on vertical surface in standing while copying peg design. Pt required cueing for following peg design, to use Rt hand, and for manipulating peg for placement. Therapist held pt's Lt hand to force use of Rt hand in controlled environment.                OT Short Term Goals - 05/31/17 1325      OT SHORT TERM GOAL #1   Title  Pt/family independent with coordination and putty HEP - 06/04/2017    Time  4    Period  Weeks    Status  Achieved      OT SHORT TERM GOAL #2   Title  Pt/family independent with HEP for RUE shoulder ROM    Time  4    Period  Weeks    Status  Achieved      OT SHORT TERM GOAL #3   Title  Pt/family to verbalize understanding with safety  with shower transfers and bathing with DME/AE prn    Time  4    Period  Weeks    Status  Achieved      OT SHORT TERM GOAL #4   Title  Pt/family to verbalize understanding with memory strategies for sequencing with donning shirt/pants, sequencing and safety with use of walker/rollator, and for medication management    Time  4    Period  Weeks    Status  Achieved      OT SHORT TERM GOAL #5   Title  Pt to improve grip strength by 10 lbs Rt hand     Baseline  22 lbs    Time  4    Period  Weeks    Status  Achieved 05/31/2017 R= 35      OT SHORT TERM GOAL #6   Title  Pt to improve coordination Rt hand as evidenced by performing 9 hole peg test to 100 sec. or less    Baseline  2 min. 6 sec    Time  4    Period  Weeks    Status  Achieved 54.25 sec        OT Long Term Goals - 06/13/17 1228      OT LONG TERM GOAL #1   Title  Pt to retrieve light weight object from high shelf RUE requiring 120 degrees shoulder flexion w/o drops 5/5 trials -07/01/2017    Time  8    Period  Weeks    Status  On-going      OT LONG TERM GOAL #2   Title  Pt to perform simple snack prep and light IADLS (washing dishes, laundry tasks) demo safety without walker with CGA and min cues for safety    Time  8    Period  Weeks    Status  Revised      OT LONG TERM GOAL #3   Title  Pt to implement memory strategies into ADLS including sequencing of donning clothes, walker negotiation, and medication management with supervision and no more than min cues    Time  8    Period  Weeks    Status  On-going      OT LONG TERM GOAL #4   Title  Pt to improve coordination  Rt hand as evidenced by performing 9 hole peg test in 45 sec. or less    Baseline  2 min. 6 sec    Time  8    Period  Weeks    Status  Revised      OT LONG TERM GOAL #5   Title  Pt to tie shoes and style hair mod I level with A/E prn    Time  8    Period  Weeks    Status  Partially Met Pt can tie shoes and wash hair, but unable to style hair w/o  assist            Plan - 06/13/17 1230    Clinical Impression Statement  Pt limited in progress d/t cognitive impairments, Rt neglect, and decr. balance.     Current Impairments/barriers affecting progress:  severity of deficits including aphasia, cognition    OT Frequency  2x / week    OT Duration  8 weeks    OT Treatment/Interventions  Self-care/ADL training;DME and/or AE instruction;Patient/family education;Therapeutic exercises;Therapeutic activities;Functional Mobility Training;Passive range of motion;Cognitive remediation/compensation;Visual/perceptual remediation/compensation;Manual Therapy;Moist Heat    Plan  re-assess grip strength and coordination, begin addressing/assessing remaining LTG's    Consulted and Agree with Plan of Care  Patient;Family member/caregiver    Family Member Consulted  husband-[name]       Patient will benefit from skilled therapeutic intervention in order to improve the following deficits and impairments:  Decreased coordination, Decreased range of motion, Impaired flexibility, Impaired sensation, Decreased safety awareness, Decreased knowledge of precautions, Impaired tone, Impaired UE functional use, Decreased knowledge of use of DME, Decreased mobility, Decreased balance, Decreased cognition, Decreased strength, Impaired perceived functional ability, Impaired vision/preception  Visit Diagnosis: Hemiplegia and hemiparesis following cerebral infarction affecting right non-dominant side (HCC)  Apraxia  Other symptoms and signs involving cognitive functions following cerebral infarction  Unsteadiness on feet  Other lack of coordination    Problem List Patient Active Problem List   Diagnosis Date Noted  . Subarachnoid hemorrhage ([place]) 06/04/2017  . Multiple sclerosis ([place]) 05/12/2017  . CAD (coronary artery disease) 04/12/2017  . Hypertension 04/11/2017  . Right hemiparesis ([place])   . Aspiration pneumonia of right lung ([place])   . Leukocytosis    . Hypokalemia   . Labile blood pressure   . Abnormality of gait as late effect of stroke 02/23/2017  . Dysarthria as late effect of stroke 02/23/2017  . Dysphagia as late effect of stroke 02/23/2017  . Acute ischemic left middle cerebral artery (MCA) stroke ([place]) 02/22/2017  . Paroxysmal A-fib ([place]) 02/17/2017  . Atrial fibrillation with RVR ([place]) 02/17/2017  . HLD (hyperlipidemia) 02/17/2017  . Acute hypoxemic respiratory failure ([place])   . Stroke (cerebrum) ([place]) - Acute MCA infarct due to left CCA/ICA and MCA occlusion, s/p mechanical thrombectomy and left ICA stenting in the setting of newly diagnosed afib 02/14/2017    Carey Bullocks, OTR/L 06/13/2017, 12:31 PM  [facility] [address] [place], [place], [address] Phone: [phone]   Fax:  [phone]  Name: Aislyn Hayse MRN: [id number] Date of Birth: 04/12/1946

## 2017-06-13 NOTE — Therapy (Signed)
[facility] [address] [place], [place], [address] Phone: [phone]   Fax:  [phone]  Physical Therapy Treatment  Patient Details  Name: Kaitlin [name] MRN: [id number] Date of Birth: November 25, 1945 Referring Provider: Alysia Penna MD   Encounter Date: 06/13/2017  PT End of Session - 06/13/17 1908    Visit Number  10    Number of Visits  17    Date for PT Re-Evaluation  07/04/17    Authorization Type  UHC MCR    Authorization Time Period  05/05/17  to  07/04/17    PT Start Time  1018    PT Stop Time  1100    PT Time Calculation (min)  42 min    Equipment Utilized During Treatment  Gait belt    Activity Tolerance  Patient tolerated treatment well    Behavior During Therapy  [facility] for tasks assessed/performed       Past Medical History:  Diagnosis Date  . Abnormality of gait as late effect of stroke 02/23/2017  . Acute hypoxemic respiratory failure ([place])   . Atrial fibrillation with RVR ([place]) 02/17/2017  . CAD S/P percutaneous coronary angioplasty 2001  . CVA (cerebral vascular accident) ([place]) 01/2017  . Dysphagia as late effect of stroke 02/23/2017  . HCVD (hypertensive cardiovascular disease) 01/2017   normal LVF with moderate LVH, grade 1 DD  . High cholesterol   . Hypokalemia   . Labile blood pressure   . Leukocytosis   . Multiple sclerosis ([place])   . PAF (paroxysmal atrial fibrillation) ([place]) 01/2017  . Right hemiparesis [facility])     Past Surgical History:  Procedure Laterality Date  . CORONARY ANGIOPLASTY WITH STENT PLACEMENT  11/1999   [facility]CA [facility]  . IR ANGIO INTRA EXTRACRAN SEL COM CAROTID INNOMINATE UNI R MOD SED  02/15/2017  . IR ANGIO VERTEBRAL SEL SUBCLAVIAN INNOMINATE UNI R MOD SED  02/15/2017  . IR INTRAVSC STENT CERV CAROTID W/O EMB-PROT MOD SED INC ANGIO  02/15/2017  . IR PERCUTANEOUS ART THROMBECTOMY/INFUSION INTRACRANIAL INC DIAG ANGIO  02/15/2017  . IR RADIOLOGIST EVAL & MGMT  05/10/2017   . NECK SURGERY    . WRIST SURGERY      There were no vitals filed for this visit.  Subjective Assessment - 06/13/17 1023    Subjective  No new complaints, no new falls since last session, no pain to report    Patient is accompained by:  Family member    Patient Stated Goals  "Standing up straight; getting my self-control" (balance)    Currently in Pain?  No/denies                      [facility] Adult PT Treatment/Exercise - 06/13/17 1915      Standardized Balance Assessment   Standardized Balance Assessment  Timed Up and Go Test      Timed Up and Go Test   TUG  Normal TUG    Normal TUG (seconds)  23.37 with RW; 17.37 no AD      Balance training- on red, compliant mat walking in all directions with light UE support either on counter or by PT with HHA. Worked on single large steps forward and backward with emphasis on being able to stop and control her balance.   Gait training-Husband reported pt continues to use RW at home despite previous recommendations to not use it as husband reports her 2 falls at home have occurred in small spaces  with tripping over the RW. He reports the RW can fit thru ~75% of the home easily and only ~25% of home are small or narrow spaces. Gait training with RW and pt continues to frequently carry RW, has poor sequencing with transfers, tends to trail too far behind RW which leads to her feet entangling with back legs of RW (especially during turns). Pre-gait training to emphasize heel strike and foot clearance with RLE. Patient did well when walking towards a mirror and trying to pick foot up enough to "see the bottom of your shoe" and was even able to carryover better foot clearance with this verbal cue and no mirror present.          PT Short Term Goals - 06/08/17 1418      PT SHORT TERM GOAL #1   Title  Patient (with supervision to min-guard assistance of her spouse) will perform basic HEP for balance and strengthening. (Target STGs  06/04/17)    Baseline  05/30/17: goals achieved    Time  4    Period  Weeks    Status  Achieved      PT SHORT TERM GOAL #2   Title  Patient will complete FGA with STG and LTG set as appropriate.    Baseline  10/10 FGA 9/30; 10/16 goal set    Time  1    Period  Weeks    Status  Achieved      PT SHORT TERM GOAL #3   Title  Patient will improve TUG with or without a device to <15 seconds to demonstrate a lesser fall risk.     Baseline  rollator 18.25; no device 17.81 sec. 05/30/17: No AD 18.10/ RW 26.59    Time  4    Period  Weeks    Status  Not Met      PT SHORT TERM GOAL #4   Title  Patient will improve her gait velocity to 2.62 ft/sec to indicate lesser fall risk with community ambulation.     Baseline  05/30/17: 2.04 ft/sec without AD 1.58f/sec with AD    Time  4    Period  Weeks    Status  Not Met      PT SHORT TERM GOAL #5   Title  Patient will improve FGA score to >=14/30 to demonstrate progress towards lesser fall risk    Baseline  05/31/17: Pt improved score to 16/30    Time  3    Period  Weeks    Status  Achieved        PT Long Term Goals - 06/08/17 1418      PT LONG TERM GOAL #1   Title  Patient and/or husband will be able to verbalize signs and symptoms of CVA and appropriate action to take. (Target LTGs: 07/04/17)    Time  8    Period  Weeks    Status  New      PT LONG TERM GOAL #2   Title  Patient (with supervision of husband) will perform updated HEP    Time  8    Period  Weeks    Status  New      PT LONG TERM GOAL #3   Title  Patient will ambulate >=2.79 ft/sec (age based norm) with LRAD or no device on level surface.    Time  8    Period  Weeks    Status  New      PT LONG TERM GOAL #  4   Title  Patient will ambulate >= 1000 ft on outdoor surfaces (level and unlevel, grass, gravel, mulch) with LRAD and supervision.     Time  8    Period  Weeks    Status  New      PT LONG TERM GOAL #5   Title  Patient will perform TUG <13.5 seconds to  demonstrate lesser fall risk.    Time  8    Period  Weeks    Status  New      PT LONG TERM GOAL #6   Title  Patient will improve FGA score by 4 points above her score assessed at STGs (TBA).    Baseline  10/16 goal set per STG set on eval    Time  [place] - 01-Jul-2017 1910    Clinical Impression Statement  Session focused on gait training and balance training. Patient continues to require max cues and assistance to prevent falls. Her cognition is slowing progress, however husband is present for every session. He does not always carry through on PT recommendations.     Rehab Potential  Good    Clinical Impairments Affecting Rehab Potential  impulsivity and decr awareness of deficits    PT Frequency  2x / week    PT Duration  8 weeks    PT Treatment/Interventions  ADLs/Self Care Home Management;DME Instruction;Gait training;Stair training;Functional mobility training;Orthotic Fit/Training;Patient/family education;Cognitive remediation;Neuromuscular re-education;Balance training;Therapeutic exercise;Therapeutic activities    PT Next Visit Plan  Continue to work on gait training without AD, weight shifting tasks, LE strengthening balance on compliant surfaces and COG challanges.     Consulted and Agree with Plan of Care  Patient;Family member/caregiver    Family Member Consulted  spouse, [name]       Patient will benefit from skilled therapeutic intervention in order to improve the following deficits and impairments:  Abnormal gait, Decreased balance, Decreased cognition, Decreased knowledge of use of DME, Decreased mobility, Decreased safety awareness, Decreased strength, Difficulty walking, Impaired perceived functional ability, Impaired sensation, Impaired UE functional use  Visit Diagnosis: Unsteadiness on feet  Muscle weakness (generalized)  Other abnormalities of gait and mobility   G-Codes - 2017-07-01 1916    Functional Assessment Tool  Used (Outpatient Only)  TUG 23.37 seconds with rollator; 17.37 sec no device    Functional Limitation  Mobility: Walking and moving around    Mobility: Walking and Moving Around Current Status [phone])  At least 20 percent but less than 40 percent impaired, limited or restricted    Mobility: Walking and Moving Around Goal Status [phone])  At least 1 percent but less than 20 percent impaired, limited or restricted       Problem List Patient Active Problem List   Diagnosis Date Noted  . Subarachnoid hemorrhage ([place]) 06/04/2017  . Multiple sclerosis ([place]) 05/12/2017  . CAD (coronary artery disease) 04/12/2017  . Hypertension 04/11/2017  . Right hemiparesis ([place])   . Aspiration pneumonia of right lung ([place])   . Leukocytosis   . Hypokalemia   . Labile blood pressure   . Abnormality of gait as late effect of stroke 02/23/2017  . Dysarthria as late effect of stroke 02/23/2017  . Dysphagia as late effect of stroke 02/23/2017  . Acute ischemic left middle cerebral artery (MCA) stroke ([place]) 02/22/2017  . Paroxysmal A-fib ([place]) 02/17/2017  . Atrial fibrillation  with RVR ([place]) 02/17/2017  . HLD (hyperlipidemia) 02/17/2017  . Acute hypoxemic respiratory failure ([place])   . Stroke (cerebrum) ([facility]) - Acute MCA infarct due to left CCA/ICA and MCA occlusion, s/p mechanical thrombectomy and left ICA stenting in the setting of newly diagnosed afib 02/14/2017   Physical Therapy Progress Note  Dates of Reporting Period: 05/06/17 to 06/13/17  Objective Reports of Subjective Statement: Patient's TUG time without a device much faster than with RW; demonstrates lesser fall risk without the device.  Objective Measurements: see TUG  Goal Update: NA  Plan: continue with current plan  Reason Skilled Services are Required: Patient remains high fall risk    Rexanne Mano, PT 06/13/2017, 7:17 PM  [facility] [address] [place], [place],  [address] Phone: [phone]   Fax:  [phone]  Name: Kaitlin [name] MRN: [id number] Date of Birth: January 07, 1946

## 2017-06-14 NOTE — Therapy (Signed)
[facility] [address] [place], [place], [address] Phone: [phone]   Fax:  [phone]  Speech Language Pathology Treatment  Patient Details  Name: Kaitlin [name] MRN: [id number] Date of Birth: Jun 13, 1946 Referring Provider: Alysia Penna, MD   Encounter Date: 06/13/2017  End of Session - 06/14/17 1707    Visit Number  10    Number of Visits  17    Date for SLP Re-Evaluation  07/11/17    SLP Start Time  1148    SLP Stop Time   1230    SLP Time Calculation (min)  42 min    Activity Tolerance  Patient tolerated treatment well       Past Medical History:  Diagnosis Date  . Abnormality of gait as late effect of stroke 02/23/2017  . Acute hypoxemic respiratory failure ([place])   . Atrial fibrillation with RVR ([place]) 02/17/2017  . CAD S/P percutaneous coronary angioplasty 2001  . CVA (cerebral vascular accident) ([place]) 01/2017  . Dysphagia as late effect of stroke 02/23/2017  . HCVD (hypertensive cardiovascular disease) 01/2017   normal LVF with moderate LVH, grade 1 DD  . High cholesterol   . Hypokalemia   . Labile blood pressure   . Leukocytosis   . Multiple sclerosis ([place])   . PAF (paroxysmal atrial fibrillation) ([place]) 01/2017  . Right hemiparesis [facility])     Past Surgical History:  Procedure Laterality Date  . CORONARY ANGIOPLASTY WITH STENT PLACEMENT  11/1999   RCA DES-[facility]  . IR ANGIO INTRA EXTRACRAN SEL COM CAROTID INNOMINATE UNI R MOD SED  02/15/2017  . IR ANGIO VERTEBRAL SEL SUBCLAVIAN INNOMINATE UNI R MOD SED  02/15/2017  . IR INTRAVSC STENT CERV CAROTID W/O EMB-PROT MOD SED INC ANGIO  02/15/2017  . IR PERCUTANEOUS ART THROMBECTOMY/INFUSION INTRACRANIAL INC DIAG ANGIO  02/15/2017  . IR RADIOLOGIST EVAL & MGMT  05/10/2017  . NECK SURGERY    . WRIST SURGERY      There were no vitals filed for this visit.  Subjective Assessment - 06/13/17 1150    Subjective  Arrives with husband today.    Patient  is accompained by:  Family member husband, [name]    Currently in Pain?  No/denies            ADULT SLP TREATMENT - 06/14/17 0001      General Information   Behavior/Cognition  Alert;Cooperative;Pleasant mood;Requires cueing      Treatment Provided   Treatment provided  Cognitive-Linquistic      Cognitive-Linquistic Treatment   Treatment focused on  Aphasia    Skilled Treatment  SLP asked pt/husband what happens at home that's still frustrating - husband said pt frustrated with dysnomia as stated in "s". Husband also states he must repeat statements/questions >5 times/day. SLP engaged in simple conversation with pt and husband addressing pt's anomic/dysnomic events in real time, educating husband about how to assist pt if/when she has difficulties wiht naming.       Assessment / Recommendations / Plan   Plan  Continue with current plan of care      Progression Toward Goals   Progression toward goals  Progressing toward goals       SLP Education - 06/14/17 1707    Education provided  Yes    Education Details  how to assist pt with language when in conversation    Person(s) Educated  Patient;Spouse    Methods  Explanation;Demonstration    Comprehension  Verbalized understanding;Returned demonstration;Verbal  cues required;Need further instruction       SLP Short Term Goals - 06/06/17 1316      SLP SHORT TERM GOAL #1   Title  pt will generate 8 items in a simple category with compensations allowed    Status  Not Met      SLP SHORT TERM GOAL #2   Title  pt will provide sentence level responses using language compensations 80% success    Status  Not Met      SLP SHORT TERM GOAL #3   Title  pt will demo understanding of 5 minutes mod complex conversation, by providing appropriate responses in conversation 95% over three sessions    Status  Not Met      SLP SHORT TERM GOAL #4   Title  pt will demo appropriate breath support 95% for improvement in voice volume at word level     Status  Deferred       SLP Long Term Goals - 06/14/17 1710      SLP LONG TERM GOAL #1   Title  pt will demo appropriate breath support for adequate speech volume in 10 minutes simple conversation     Time  3    Period  Weeks    Status  On-going      SLP LONG TERM GOAL #2   Title  pt will demo understanding of 8 minutes min-mod complex conversation with request for repeats PRN    Time  3    Period  Weeks    Status  On-going      SLP LONG TERM GOAL #3   Title  Pt will complete mod complex naming tasks with occasional min A    Time  3    Period  Weeks    Status  Revised      SLP LONG TERM GOAL #4   Title  pt will produce 7 minutes simple conversation using compensations for anomia over two sessions    Time  3    Period  Weeks    Status  Revised       Plan - 06/14/17 1708    Clinical Impression Statement  Pt presents today with cont'd expressive aphasia, and receptive aphasia. Pt continues to ask husband for assistance whether in conversation or with specific speech tasks, however husband doing better job at encouraging pt to think of word on her own. SLP educated pt's husband more today with how to assist pt in conversation. Pt will cont to receive skilled ST to primarily address aphasia, but also dysarthria (volume) PRN    Speech Therapy Frequency  2x / week    Treatment/Interventions  Language facilitation;Internal/external aids;Compensatory techniques;SLP instruction and feedback;Multimodal communcation approach;Cognitive reorganization;Functional tasks;Cueing hierarchy;Compensatory strategies;Patient/family education    Potential to Achieve Goals  Fair    Potential Considerations  Co-morbidities;Severity of impairments;Ability to learn/carryover information;Previous level of function    Consulted and Agree with Plan of Care  Patient;Family member/caregiver    Family Member Consulted  husband       Patient will benefit from skilled therapeutic intervention in order to  improve the following deficits and impairments:   Aphasia  Dysarthria and anarthria  Cognitive communication deficit   Speech Therapy Progress Note  Dates of Reporting Period: eval date to present (06-13-17)  Subjective Statement: Pt has been seen for 10 visits addressing expressive and also receptive language deficits.   Objective Measurements: Spontaneous speech has improved however specific word finding in conversation makes simple conversation  burdensome for the pt.  Goal Update: See above.  Plan: Cont to see pt.  Reason Skilled Services are Required: She has not yet met full rehab potential. Husband requires more A with assisting pt without providing her the words she needs. Pt needs to use more compensations for verbal language.   Problem List Patient Active Problem List   Diagnosis Date Noted  . Subarachnoid hemorrhage ([place]) 06/04/2017  . Multiple sclerosis ([place]) 05/12/2017  . CAD (coronary artery disease) 04/12/2017  . Hypertension 04/11/2017  . Right hemiparesis ([place])   . Aspiration pneumonia of right lung ([place])   . Leukocytosis   . Hypokalemia   . Labile blood pressure   . Abnormality of gait as late effect of stroke 02/23/2017  . Dysarthria as late effect of stroke 02/23/2017  . Dysphagia as late effect of stroke 02/23/2017  . Acute ischemic left middle cerebral artery (MCA) stroke ([place]) 02/22/2017  . Paroxysmal A-fib ([place]) 02/17/2017  . Atrial fibrillation with RVR ([place]) 02/17/2017  . HLD (hyperlipidemia) 02/17/2017  . Acute hypoxemic respiratory failure ([place])   . Stroke (cerebrum) ([place]) - Acute MCA infarct due to left CCA/ICA and MCA occlusion, s/p mechanical thrombectomy and left ICA stenting in the setting of newly diagnosed afib 02/14/2017    [facility] ,MS, CCC-SLP  06/14/2017, 5:11 PM  [facility] [address] [place], [place], [address] Phone: [phone]   Fax:   [phone]   Name: Kaitlin [name] MRN: [id number] Date of Birth: 30-Nov-1945

## 2017-06-15 ENCOUNTER — Ambulatory Visit: Payer: Medicare Other

## 2017-06-15 ENCOUNTER — Ambulatory Visit: Payer: Medicare Other | Admitting: Occupational Therapy

## 2017-06-15 ENCOUNTER — Ambulatory Visit: Payer: Medicare Other | Admitting: Physical Therapy

## 2017-06-15 DIAGNOSIS — I69353 Hemiplegia and hemiparesis following cerebral infarction affecting right non-dominant side: Secondary | ICD-10-CM

## 2017-06-15 DIAGNOSIS — I69318 Other symptoms and signs involving cognitive functions following cerebral infarction: Secondary | ICD-10-CM

## 2017-06-15 DIAGNOSIS — R2681 Unsteadiness on feet: Secondary | ICD-10-CM

## 2017-06-15 DIAGNOSIS — R482 Apraxia: Secondary | ICD-10-CM

## 2017-06-15 DIAGNOSIS — R471 Dysarthria and anarthria: Secondary | ICD-10-CM

## 2017-06-15 DIAGNOSIS — M6281 Muscle weakness (generalized): Secondary | ICD-10-CM

## 2017-06-15 DIAGNOSIS — R4701 Aphasia: Secondary | ICD-10-CM

## 2017-06-15 DIAGNOSIS — R41841 Cognitive communication deficit: Secondary | ICD-10-CM

## 2017-06-15 DIAGNOSIS — R278 Other lack of coordination: Secondary | ICD-10-CM

## 2017-06-15 NOTE — Therapy (Signed)
[facility] [address] [place], [place], [address] Phone: [phone]   Fax:  [phone]  Occupational Therapy Treatment  Patient Details  Name: Kaitlin [name] MRN: [id number] Date of Birth: 10/21/1945 Referring Provider: Dr. Letta Pate   Encounter Date: 06/15/2017  OT End of Session - 06/15/17 1205    Visit Number  12    Number of Visits  17    Date for OT Re-Evaluation  07/04/17    Authorization Type  [employer] MCR - G code needed    Authorization - Visit Number  12    Authorization - Number of Visits  20    OT Start Time  1107    OT Stop Time  1145    OT Time Calculation (min)  38 min    Activity Tolerance  Patient tolerated treatment well       Past Medical History:  Diagnosis Date  . Abnormality of gait as late effect of stroke 02/23/2017  . Acute hypoxemic respiratory failure ([place])   . Atrial fibrillation with RVR ([place]) 02/17/2017  . CAD S/P percutaneous coronary angioplasty 2001  . CVA (cerebral vascular accident) ([place]) 01/2017  . Dysphagia as late effect of stroke 02/23/2017  . HCVD (hypertensive cardiovascular disease) 01/2017   normal LVF with moderate LVH, grade 1 DD  . High cholesterol   . Hypokalemia   . Labile blood pressure   . Leukocytosis   . Multiple sclerosis ([place])   . PAF (paroxysmal atrial fibrillation) ([place]) 01/2017  . Right hemiparesis [facility])     Past Surgical History:  Procedure Laterality Date  . CORONARY ANGIOPLASTY WITH STENT PLACEMENT  11/1999   [facility]  . IR ANGIO INTRA EXTRACRAN SEL COM CAROTID INNOMINATE UNI R MOD SED  02/15/2017  . IR ANGIO VERTEBRAL SEL SUBCLAVIAN INNOMINATE UNI R MOD SED  02/15/2017  . IR INTRAVSC STENT CERV CAROTID W/O EMB-PROT MOD SED INC ANGIO  02/15/2017  . IR PERCUTANEOUS ART THROMBECTOMY/INFUSION INTRACRANIAL INC DIAG ANGIO  02/15/2017  . IR RADIOLOGIST EVAL & MGMT  05/10/2017  . NECK SURGERY    . WRIST SURGERY      There were no vitals filed for  this visit.  Subjective Assessment - 06/15/17 1110    Pertinent History  Recent mild SAH from fall 06/03/17, CVA 02/14/17 PMH: MS for approx 20 years (but no exacerbation for 10 years), A-fib, CAD w/ stent in 2000, white matter dz    Patient Stated Goals  to increase balance    Currently in Pain?  No/denies         [facility] OT Assessment - 06/15/17 0001      Coordination   Right 9 Hole Peg Test  37.38 sec      Hand Function   Right Hand Grip (lbs)  25 lbs               OT Treatments/Exercises (OP) - 06/15/17 0001      ADLs   ADL Comments  Reviewed goals and progress to date. Pt has not regained Rt grip strength since fall, however has made significant progress in Rt hand coordination. Reviewed LTG's w/ patient/husband      Hand Exercises   Other Hand Exercises  Gripper set at level 1 resistance to pick up blocks Rt hand for sustained grip strength, control, and increased attention/body awareness to Rt side. Pt with multiple drops partly d/t decreased sustained attention, partly d/t losing proper hand placement, and partly d/t fatigue. Pt  required 2 rest breaks       Visual/Perceptual Exercises   Other  Stringing pegs for bilateral integration while copying simple design for visual perceptual skills      Functional Reaching Activities   High Level  standing to retrieve replace items from lower cabinets with min cues to perform with one hand countertop support for balance. Pt then performing high level reaching to retrieve/replace cones               OT Short Term Goals - 05/31/17 1325      OT SHORT TERM GOAL #1   Title  Pt/family independent with coordination and putty HEP - 06/04/2017    Time  4    Period  Weeks    Status  Achieved      OT SHORT TERM GOAL #2   Title  Pt/family independent with HEP for RUE shoulder ROM    Time  4    Period  Weeks    Status  Achieved      OT SHORT TERM GOAL #3   Title  Pt/family to verbalize understanding with safety with  shower transfers and bathing with DME/AE prn    Time  4    Period  Weeks    Status  Achieved      OT SHORT TERM GOAL #4   Title  Pt/family to verbalize understanding with memory strategies for sequencing with donning shirt/pants, sequencing and safety with use of walker/rollator, and for medication management    Time  4    Period  Weeks    Status  Achieved      OT SHORT TERM GOAL #5   Title  Pt to improve grip strength by 10 lbs Rt hand     Baseline  22 lbs    Time  4    Period  Weeks    Status  Achieved 05/31/2017 R= 35      OT SHORT TERM GOAL #6   Title  Pt to improve coordination Rt hand as evidenced by performing 9 hole peg test to 100 sec. or less    Baseline  2 min. 6 sec    Time  4    Period  Weeks    Status  Achieved 54.25 sec        OT Long Term Goals - 06/15/17 1206      OT LONG TERM GOAL #1   Title  Pt to retrieve light weight object from high shelf RUE requiring 120 degrees shoulder flexion w/o drops 5/5 trials -07/01/2017    Time  8    Period  Weeks    Status  On-going      OT LONG TERM GOAL #2   Title  Pt to perform simple snack prep and light IADLS (washing dishes, laundry tasks) demo safety without walker with CGA and min cues for safety    Time  8    Period  Weeks    Status  Revised      OT LONG TERM GOAL #3   Title  Pt to implement memory strategies into ADLS including sequencing of donning clothes and safety with transfers with supervision and no more than min cues    Time  8    Period  Weeks    Status  Revised      OT LONG TERM GOAL #4   Title  Pt to improve coordination Rt hand as evidenced by performing 9 hole peg test in 45 sec. or  less    Baseline  2 min. 6 sec    Time  8    Period  Weeks    Status  Achieved 06/15/17: 37.38 sec      OT LONG TERM GOAL #5   Title  Pt to tie shoes and style hair mod I level with A/E prn    Time  8    Period  Weeks    Status  Partially Met Pt can tie shoes and wash hair, but unable to style hair w/o  assist            Plan - 06/15/17 1208    Clinical Impression Statement  Pt with improvements in coordination RT hand. Pt still Naval architect. grip strength since fall and Rt neglect    Rehab Potential  Fair    Current Impairments/barriers affecting progress:  severity of deficits including aphasia, cognition    OT Frequency  2x / week    OT Duration  8 weeks    OT Treatment/Interventions  Self-care/ADL training;DME and/or AE instruction;Patient/family education;Therapeutic exercises;Therapeutic activities;Functional Mobility Training;Passive range of motion;Cognitive remediation/compensation;Visual/perceptual remediation/compensation;Manual Therapy;Moist Heat    Plan  simple IADLS (making sandwich, folding laundry, washing dishes)     Consulted and Agree with Plan of Care  Patient;Family member/caregiver    Family Member Consulted  husband-[name]       Patient will benefit from skilled therapeutic intervention in order to improve the following deficits and impairments:  Decreased coordination, Decreased range of motion, Impaired flexibility, Impaired sensation, Decreased safety awareness, Decreased knowledge of precautions, Impaired tone, Impaired UE functional use, Decreased knowledge of use of DME, Decreased mobility, Decreased balance, Decreased cognition, Decreased strength, Impaired perceived functional ability, Impaired vision/preception  Visit Diagnosis: Hemiplegia and hemiparesis following cerebral infarction affecting right non-dominant side (HCC)  Other symptoms and signs involving cognitive functions following cerebral infarction  Unsteadiness on feet  Other lack of coordination  Muscle weakness (generalized)    Problem List Patient Active Problem List   Diagnosis Date Noted  . Subarachnoid hemorrhage ([place]) 06/04/2017  . Multiple sclerosis ([place]) 05/12/2017  . CAD (coronary artery disease) 04/12/2017  . Hypertension 04/11/2017  . Right hemiparesis ([place])   . Aspiration  pneumonia of right lung ([place])   . Leukocytosis   . Hypokalemia   . Labile blood pressure   . Abnormality of gait as late effect of stroke 02/23/2017  . Dysarthria as late effect of stroke 02/23/2017  . Dysphagia as late effect of stroke 02/23/2017  . Acute ischemic left middle cerebral artery (MCA) stroke ([place]) 02/22/2017  . Paroxysmal A-fib ([place]) 02/17/2017  . Atrial fibrillation with RVR ([place]) 02/17/2017  . HLD (hyperlipidemia) 02/17/2017  . Acute hypoxemic respiratory failure ([place])   . Stroke (cerebrum) ([place]) - Acute MCA infarct due to left CCA/ICA and MCA occlusion, s/p mechanical thrombectomy and left ICA stenting in the setting of newly diagnosed afib 02/14/2017    Carey Bullocks, OTR/L 06/15/2017, 12:11 PM  [facility] [address] [place], [place], [address] Phone: [phone]   Fax:  [phone]  Name: Kaitlin [name] MRN: [id number] Date of Birth: Jan 03, 1946

## 2017-06-16 NOTE — Patient Instructions (Signed)
  Please complete the assigned speech therapy homework prior to your next session and return it to the speech therapist at your next visit.  

## 2017-06-16 NOTE — Therapy (Signed)
[facility] [address] [place], [place], [address] Phone: [phone]   Fax:  [phone]  Speech Language Pathology Treatment  Patient Details  Name: Kaitlin [name] MRN: [id number] Date of Birth: Jun 16, 1946 Referring Provider: Alysia Penna, MD   Encounter Date: 06/15/2017  End of Session - 06/16/17 0528    Visit Number  11    Number of Visits  17    Date for SLP Re-Evaluation  07/11/17    SLP Start Time  1149    SLP Stop Time   1232    SLP Time Calculation (min)  43 min    Activity Tolerance  Patient tolerated treatment well       Past Medical History:  Diagnosis Date  . Abnormality of gait as late effect of stroke 02/23/2017  . Acute hypoxemic respiratory failure ([place])   . Atrial fibrillation with RVR ([place]) 02/17/2017  . CAD S/P percutaneous coronary angioplasty 2001  . CVA (cerebral vascular accident) ([place]) 01/2017  . Dysphagia as late effect of stroke 02/23/2017  . HCVD (hypertensive cardiovascular disease) 01/2017   normal LVF with moderate LVH, grade 1 DD  . High cholesterol   . Hypokalemia   . Labile blood pressure   . Leukocytosis   . Multiple sclerosis ([place])   . PAF (paroxysmal atrial fibrillation) ([place]) 01/2017  . Right hemiparesis [facility])     Past Surgical History:  Procedure Laterality Date  . CORONARY ANGIOPLASTY WITH STENT PLACEMENT  11/1999   [facility]  . IR ANGIO INTRA EXTRACRAN SEL COM CAROTID INNOMINATE UNI R MOD SED  02/15/2017  . IR ANGIO VERTEBRAL SEL SUBCLAVIAN INNOMINATE UNI R MOD SED  02/15/2017  . IR INTRAVSC STENT CERV CAROTID W/O EMB-PROT MOD SED INC ANGIO  02/15/2017  . IR PERCUTANEOUS ART THROMBECTOMY/INFUSION INTRACRANIAL INC DIAG ANGIO  02/15/2017  . IR RADIOLOGIST EVAL & MGMT  05/10/2017  . NECK SURGERY    . RADIOLOGY WITH ANESTHESIA N/A 02/14/2017   Procedure: RADIOLOGY WITH ANESTHESIA;  Surgeon: Radiologist, Medication, MD;  Location: [facility];  Service: Radiology;   Laterality: N/A;  . WRIST SURGERY      There were no vitals filed for this visit.  Subjective Assessment - 06/15/17 1725    Subjective  Arrives with husband today.    Patient is accompained by:  Family member huaband            ADULT SLP TREATMENT - 06/16/17 0001      General Information   Behavior/Cognition  Alert;Cooperative;Pleasant mood;Requires cueing      Treatment Provided   Treatment provided  Cognitive-Linquistic      Cognitive-Linquistic Treatment   Treatment focused on  Aphasia    Skilled Treatment  SLP facilitated pt's communication skills by picture description (generatign phrases/short sentences)- pt demonstrated misunderstanding of what task was until after one opportunity for task completion. Pt req'd cues for salient information (e.g., elephant with trunk, giraffe with long neck, etc). SLP engaged in simple conversation today and pt looked to husband 50% of the time when experiencing difficulty - husband very good at not filling in words for pt but encouraging pt to continue with his assistance.      Assessment / Recommendations / Plan   Plan  Continue with current plan of care      Progression Toward Goals   Progression toward goals  Progressing toward goals         SLP Short Term Goals - 06/06/17 1316  SLP SHORT TERM GOAL #1   Title  pt will generate 8 items in a simple category with compensations allowed    Status  Not Met      SLP SHORT TERM GOAL #2   Title  pt will provide sentence level responses using language compensations 80% success    Status  Not Met      SLP SHORT TERM GOAL #3   Title  pt will demo understanding of 5 minutes mod complex conversation, by providing appropriate responses in conversation 95% over three sessions    Status  Not Met      SLP SHORT TERM GOAL #4   Title  pt will demo appropriate breath support 95% for improvement in voice volume at word level    Status  Deferred       SLP Long Term Goals - 06/16/17  0531      SLP LONG TERM GOAL #1   Title  pt will demo appropriate breath support for adequate speech volume in 10 minutes simple conversation     Time  3    Period  Weeks    Status  On-going      SLP LONG TERM GOAL #2   Title  pt will demo understanding of 8 minutes min-mod complex conversation with request for repeats PRN    Time  3    Period  Weeks    Status  On-going      SLP LONG TERM GOAL #3   Title  Pt will complete mod complex naming tasks with occasional min A    Time  3    Period  Weeks    Status  Revised      SLP LONG TERM GOAL #4   Title  pt will produce 7 minutes simple conversation using compensations for anomia over two sessions    Time  3    Period  Weeks    Status  Revised       Plan - 06/16/17 0529    Clinical Impression Statement  Pt presents today with cont'd expressive aphasia, and receptive aphasia. Pt continues to ask husband for assistance whether in conversation or with specific speech tasks, however husband doing better job at encouraging pt to think of word on her own. Pt had difficulty today ID'ing salient features of an animal to describe to SLP. Pt will cont to receive skilled ST to primarily address aphasia, but also dysarthria (volume) PRN. SLP questions whethere pt's difficulty with volume primarily has a behavioral and not neurological component.    Speech Therapy Frequency  2x / week    Treatment/Interventions  Language facilitation;Internal/external aids;Compensatory techniques;SLP instruction and feedback;Multimodal communcation approach;Cognitive reorganization;Functional tasks;Cueing hierarchy;Compensatory strategies;Patient/family education    Potential to Achieve Goals  Fair    Potential Considerations  Co-morbidities;Severity of impairments;Ability to learn/carryover information;Previous level of function    Consulted and Agree with Plan of Care  Patient;Family member/caregiver    Family Member Consulted  husband       Patient will  benefit from skilled therapeutic intervention in order to improve the following deficits and impairments:   Aphasia  Cognitive communication deficit  Apraxia  Dysarthria and anarthria    Problem List Patient Active Problem List   Diagnosis Date Noted  . Subarachnoid hemorrhage ([place]) 06/04/2017  . Multiple sclerosis ([place]) 05/12/2017  . CAD (coronary artery disease) 04/12/2017  . Hypertension 04/11/2017  . Right hemiparesis ([place])   . Aspiration pneumonia of right lung ([place])   .  Leukocytosis   . Hypokalemia   . Labile blood pressure   . Abnormality of gait as late effect of stroke 02/23/2017  . Dysarthria as late effect of stroke 02/23/2017  . Dysphagia as late effect of stroke 02/23/2017  . Acute ischemic left middle cerebral artery (MCA) stroke ([place]) 02/22/2017  . Paroxysmal A-fib ([place]) 02/17/2017  . Atrial fibrillation with RVR ([place]) 02/17/2017  . HLD (hyperlipidemia) 02/17/2017  . Acute hypoxemic respiratory failure ([place])   . Stroke (cerebrum) ([place]) - Acute MCA infarct due to left CCA/ICA and MCA occlusion, s/p mechanical thrombectomy and left ICA stenting in the setting of newly diagnosed afib 02/14/2017    [facility] ,MS, CCC-SLP  06/16/2017, 5:32 AM  [facility] [address] [place], [place], [address] Phone: [phone]   Fax:  [phone]   Name: Kaitlin [name] MRN: [id number] Date of Birth: 1945/09/05

## 2017-06-17 ENCOUNTER — Ambulatory Visit: Payer: Medicare Other | Admitting: Cardiology

## 2017-06-17 ENCOUNTER — Encounter: Payer: Self-pay | Admitting: Cardiology

## 2017-06-17 VITALS — BP 128/68 | HR 64 | Ht 62.0 in | Wt 126.0 lb

## 2017-06-17 DIAGNOSIS — S066X6D Traumatic subarachnoid hemorrhage with loss of consciousness greater than 24 hours without return to pre-existing conscious level with patient surviving, subsequent encounter: Secondary | ICD-10-CM | POA: Diagnosis not present

## 2017-06-17 DIAGNOSIS — I10 Essential (primary) hypertension: Secondary | ICD-10-CM | POA: Diagnosis not present

## 2017-06-17 DIAGNOSIS — I6300 Cerebral infarction due to thrombosis of unspecified precerebral artery: Secondary | ICD-10-CM

## 2017-06-17 DIAGNOSIS — I2510 Atherosclerotic heart disease of native coronary artery without angina pectoris: Secondary | ICD-10-CM | POA: Diagnosis not present

## 2017-06-17 DIAGNOSIS — E782 Mixed hyperlipidemia: Secondary | ICD-10-CM

## 2017-06-17 DIAGNOSIS — I480 Paroxysmal atrial fibrillation: Secondary | ICD-10-CM

## 2017-06-17 NOTE — Progress Notes (Signed)
Cardiology Office Note    Date:  06/17/2017   ID:  Kaitlin [name], DOB [birth year], MRN [id number]  PCP:  Chilton Greathouse, MD  Cardiologist: Dr. Delton See  No chief complaint on file.   History of Present Illness:  Kaitlin [name] is a 71 y.o. female a PMH significant for CAD, s/p remote RCA PCI 2001, HTN, HLD, and stable multiple sclerosis. She presented to [facility] 02/15/17 with a L MCA occlusion s/p LMCA PTA and LICA PTA/stenting. While on telemetry she had documented PAF and anticoagulation was initiated. She remained on Plavix and Xarelto.CHADSVASC=5. She was maintaining normal sinus rhythm on amiodarone and beta blocker. 2-D echo LVEF 65-70% with mild AS, no cardiac source of emboli. Patient was transferred to cardiac rehabilitation. Patient was discharged from inpatient rehabilitation 03/19/17.   06/17/2017 -  this 2 months follow-up, she was seen by Herma Carson, she's been complaining off episodes of feeling short of breath when she stands up or when she goes to the bathroom at night. However when she goes to cardiac rehabilitation and walks around for longer time she feels perfectly fine and has no shortness of breath chest pressure or tightness. 2 weeks ago she had a mechanical fall when she tripped over her walker and hit her head. She was hospitalized with finding of a right anterior frontal subarachnoid and parenchymal hemorrhage. The patient was continued on post Xarelto and Plavix, she supposed to follow with neurology in 2 weeks and if stable findings of her carotid stent her neurologist will consider discontinuation of Plavix. She denies any palpitations dizziness presyncope or syncope.  Past Medical History:  Diagnosis Date  . Abnormality of gait as late effect of stroke 02/23/2017  . Acute hypoxemic respiratory failure (HCC)   . Atrial fibrillation with RVR (HCC) 02/17/2017  . CAD S/P percutaneous coronary angioplasty 2001  . CVA (cerebral vascular accident) (HCC) 01/2017  .  Dysphagia as late effect of stroke 02/23/2017  . HCVD (hypertensive cardiovascular disease) 01/2017   normal LVF with moderate LVH, grade 1 DD  . High cholesterol   . Hypokalemia   . Labile blood pressure   . Leukocytosis   . Multiple sclerosis (HCC)   . PAF (paroxysmal atrial fibrillation) (HCC) 01/2017  . Right hemiparesis [facility])     Past Surgical History:  Procedure Laterality Date  . CORONARY ANGIOPLASTY WITH STENT PLACEMENT  11/1999   RCA DES-[facility]  . IR ANGIO INTRA EXTRACRAN SEL COM CAROTID INNOMINATE UNI R MOD SED  02/15/2017  . IR ANGIO VERTEBRAL SEL SUBCLAVIAN INNOMINATE UNI R MOD SED  02/15/2017  . IR INTRAVSC STENT CERV CAROTID W/O EMB-PROT MOD SED INC ANGIO  02/15/2017  . IR PERCUTANEOUS ART THROMBECTOMY/INFUSION INTRACRANIAL INC DIAG ANGIO  02/15/2017  . IR RADIOLOGIST EVAL & MGMT  05/10/2017  . NECK SURGERY    . RADIOLOGY WITH ANESTHESIA N/A 02/14/2017   Performed by Radiologist, Medication, MD at [facility] OR  . WRIST SURGERY      Current Medications: Current Meds  Medication Sig  . amiodarone (PACERONE) 200 MG tablet Take 1 tablet (200 mg total) by mouth daily.  Marland Kitchen atorvastatin (LIPITOR) 40 MG tablet Take 1 tablet (40 mg total) by mouth daily at 6 PM.  . Calcium Citrate-Vitamin D (CALCIUM CITRATE + D PO) Take 1 tablet by mouth daily.  . clopidogrel (PLAVIX) 75 MG tablet Take 1 tablet (75 mg total) by mouth daily.  Marland Kitchen FLUoxetine (PROZAC) 10 MG capsule Take 1 capsule (10 mg total) by mouth daily.  Marland Kitchen  HYDROcodone-acetaminophen (NORCO/VICODIN) 5-325 MG tablet Take 1-2 tablets by mouth every 4 (four) hours as needed for moderate pain.  Marland Kitchen. levETIRAcetam (KEPPRA) 500 MG tablet Take 1 tablet (500 mg total) by mouth 2 (two) times daily.  . metoprolol tartrate (LOPRESSOR) 50 MG tablet Take 1 tablet (50 mg total) by mouth 2 (two) times daily.  . Multiple Vitamins-Minerals (PRESERVISION AREDS PO) Take 1 tablet by mouth 2 (two) times daily.  . pantoprazole (PROTONIX) 40 MG tablet Take 1  tablet (40 mg total) by mouth daily.  . Rivaroxaban (XARELTO) 15 MG TABS tablet Take 1 tablet (15 mg total) by mouth daily with supper.  . venlafaxine XR (EFFEXOR-XR) 75 MG 24 hr capsule Take 1 capsule (75 mg total) by mouth daily.     Allergies:   Eliquis [apixaban] and Penicillins   Social History   Socioeconomic History  . Marital status: Married    Spouse name: None  . Number of children: None  . Years of education: None  . Highest education level: None  Social Needs  . Financial resource strain: None  . Food insecurity - worry: None  . Food insecurity - inability: None  . Transportation needs - medical: None  . Transportation needs - non-medical: None  Occupational History  . None  Tobacco Use  . Smoking status: Never Smoker  . Smokeless tobacco: Never Used  Substance and Sexual Activity  . Alcohol use: Yes    Alcohol/week: 1.8 oz    Types: 3 Glasses of wine per week    Comment: wine  . Drug use: No  . Sexual activity: None  Other Topics Concern  . None  Social History Narrative  . None     Family History:  The patient's family history includes Cancer in her father and mother; Heart disease in her father; Multiple sclerosis in her sister.   ROS:   Please see the history of present illness.    Review of Systems  Constitution: Negative.  HENT: Negative.   Eyes: Negative.   Cardiovascular: Negative.   Respiratory: Negative.   Hematologic/Lymphatic: Negative.   Musculoskeletal: Negative.  Negative for joint pain.  Gastrointestinal: Negative.   Genitourinary: Negative.   Neurological: Positive for difficulty with concentration, disturbances in coordination and loss of balance.  Psychiatric/Behavioral: Positive for memory loss.   All other systems reviewed and are negative.   PHYSICAL EXAM:   VS:  BP 128/68   Pulse 64   Ht 5\' 2"  (1.575 m)   Wt 126 lb (57.2 kg)   SpO2 98%   BMI 23.05 kg/m   Physical Exam  GEN: Well nourished, well developed, in no  acute distress  Neck: Bilateral carotid bruits no JVD,or masses Cardiac:RRR; 2/6 systolic murmur at the left sternal border Respiratory:  clear to auscultation bilaterally, normal work of breathing GI: soft, nontender, nondistended, + BS Ext: Small lesion where she bled anterior tibia from bumping it otherwise lower extremities without cyanosis, clubbing, or edema, Good distal pulses bilaterally Neuro:  Alert and Oriented x 3 Psych: euthymic mood, full affect  Wt Readings from Last 3 Encounters:  06/17/17 126 lb (57.2 kg)  06/04/17 143 lb 3.2 oz (65 kg)  05/12/17 121 lb 8 oz (55.1 kg)      Studies/Labs Reviewed:   EKG:  EKG is  ordered today.  The ekg ordered today demonstrates Normal sinus rhythm with T wave inversion inferior, anterior lateral changed from 2013 but similar to the hospital  Recent Labs: 02/18/2017: Magnesium 2.1 02/23/2017:  ALT 81 06/04/2017: BUN 16; Creatinine, Ser 0.71; Hemoglobin 10.4; Platelets 235; Potassium 4.1; Sodium 135   Lipid Panel    Component Value Date/Time   CHOL 173 02/15/2017 0420   TRIG 145 02/15/2017 0420   HDL 70 02/15/2017 0420   CHOLHDL 2.5 02/15/2017 0420   VLDL 29 02/15/2017 0420   LDLCALC 74 02/15/2017 0420    Additional studies/ records that were reviewed today include:   Echo 02/16/17- Study Conclusions   - Left ventricle: The cavity size was normal. There was moderate   concentric hypertrophy. Systolic function was vigorous. The   estimated ejection fraction was in the range of 65% to 70%. Wall   motion was normal; there were no regional wall motion   abnormalities. Doppler parameters are consistent with abnormal   left ventricular relaxation (grade 1 diastolic dysfunction).   Doppler parameters are consistent with elevated ventricular   end-diastolic filling pressure. - Aortic valve: Trileaflet; mildly thickened, mildly calcified   leaflets. There was mild stenosis. Mean gradient (S): 11 mm Hg.   Peak gradient (S): 17 mm  Hg. Valve area (VTI): 1.45 cm^2. Valve   area (Vmax): 1.73 cm^2. Valve area (Vmean): 1.44 cm^2. - Mitral valve: Calcified annulus. Mildly thickened leaflets .   There was trivial regurgitation. - Left atrium: The atrium was normal in size. - Right ventricle: The cavity size was normal. Wall thickness was   normal. Systolic function was normal. - Right atrium: The atrium was normal in size. - Tricuspid valve: There was trivial regurgitation. - Pulmonary arteries: Systolic pressure was within the normal   range. - Inferior vena cava: The vessel was normal in size. - Pericardium, extracardiac: There was no pericardial effusion.   Impressions:   - No cardiac source of emboli was indentified.     ASSESSMENT:    1. Essential hypertension   2. Cerebrovascular accident (CVA) due to thrombosis of precerebral artery (HCC)   3. Coronary artery disease involving native coronary artery of native heart without angina pectoris   4. Paroxysmal A-fib (HCC)   5. Mixed hyperlipidemia   6. Subarachnoid hematoma, with loss of consciousness greater than 24 hours without return to pre-existing conscious level with patient surviving, subsequent encounter      PLAN:  In order of problems listed above:  LMCA infarct:  S/p LMCA PTA and LICA PTA/stenting on 7/17.  She remains on plavix and xarelto (in the setting of afib).  Doing well with rehabilitation. Her Plavix might be possibly discontinued after her next carotid ultrasound. This would be beneficial as she has had 2 falls in the last 6 weeks with subarachnoid hemorrhage.  PAF on amiodarone and Xarelto maintaining normal sinus rhythm, we'll continue Xarelto.  Hypertension - well controlled  Hyperlipidemia on Lipitor, tolerated well.  CAD with remote PCI to the RCA in 2001 with diffuse T-wave inversion on EKG similar to the hospital but new since 2013. Patient without cardiac symptoms. Told to call if she has any chest pain or shortness of breath.  With recent stroke, MS and lack of cardiac symptoms. Not do further cardiac workup at this time but will follow closely.   Medication Adjustments/Labs and Tests Ordered: Current medicines are reviewed at length with the patient today.  Concerns regarding medicines are outlined above.  Medication changes, Labs and Tests ordered today are listed in the Patient Instructions below. Patient Instructions  Medication Instructions:   Your physician recommends that you continue on your current medications as directed. Please refer to  the Current Medication list given to you today.     Follow-Up:  3 MONTHS WITH DR Delton See       If you need a refill on your cardiac medications before your next appointment, please call your pharmacy.      Signed, Tobias Alexander, MD  06/17/2017 10:32 AM    [facility] [address] [place], [place], [place]  [address] Phone: [phone]; Fax: [phone]

## 2017-06-17 NOTE — Patient Instructions (Signed)
Medication Instructions:   Your physician recommends that you continue on your current medications as directed. Please refer to the Current Medication list given to you today.     Follow-Up:  3 MONTHS WITH DR NELSON       If you need a refill on your cardiac medications before your next appointment, please call your pharmacy.   

## 2017-06-20 ENCOUNTER — Ambulatory Visit: Payer: Medicare Other | Admitting: Physical Therapy

## 2017-06-20 ENCOUNTER — Ambulatory Visit: Payer: Medicare Other | Admitting: Speech Pathology

## 2017-06-20 ENCOUNTER — Encounter: Payer: Self-pay | Admitting: Physical Therapy

## 2017-06-20 ENCOUNTER — Ambulatory Visit: Payer: Medicare Other | Admitting: Occupational Therapy

## 2017-06-20 DIAGNOSIS — I69318 Other symptoms and signs involving cognitive functions following cerebral infarction: Secondary | ICD-10-CM

## 2017-06-20 DIAGNOSIS — M6281 Muscle weakness (generalized): Secondary | ICD-10-CM

## 2017-06-20 DIAGNOSIS — R2681 Unsteadiness on feet: Secondary | ICD-10-CM

## 2017-06-20 DIAGNOSIS — I69353 Hemiplegia and hemiparesis following cerebral infarction affecting right non-dominant side: Secondary | ICD-10-CM

## 2017-06-20 DIAGNOSIS — R41841 Cognitive communication deficit: Secondary | ICD-10-CM

## 2017-06-20 DIAGNOSIS — R2689 Other abnormalities of gait and mobility: Secondary | ICD-10-CM

## 2017-06-20 DIAGNOSIS — R4701 Aphasia: Secondary | ICD-10-CM

## 2017-06-20 NOTE — Therapy (Signed)
[facility] [address] [place], [place], [address] Phone: [phone]   Fax:  [phone]  Occupational Therapy Treatment  Patient Details  Name: Kaitlin [name] MRN: [id number] Date of Birth: 08/20/1945 Referring Provider: Dr. Letta Pate   Encounter Date: 06/20/2017  OT End of Session - 06/20/17 1401    Visit Number  13    Number of Visits  17    Date for OT Re-Evaluation  07/04/17    Authorization Type  [employer] MCR - G code needed    Authorization - Visit Number  13    Authorization - Number of Visits  20    OT Start Time  1310    OT Stop Time  1355    OT Time Calculation (min)  45 min    Activity Tolerance  Patient tolerated treatment well       Past Medical History:  Diagnosis Date  . Abnormality of gait as late effect of stroke 02/23/2017  . Acute hypoxemic respiratory failure ([place])   . Atrial fibrillation with RVR ([place]) 02/17/2017  . CAD S/P percutaneous coronary angioplasty 2001  . CVA (cerebral vascular accident) ([place]) 01/2017  . Dysphagia as late effect of stroke 02/23/2017  . HCVD (hypertensive cardiovascular disease) 01/2017   normal LVF with moderate LVH, grade 1 DD  . High cholesterol   . Hypokalemia   . Labile blood pressure   . Leukocytosis   . Multiple sclerosis ([place])   . PAF (paroxysmal atrial fibrillation) ([place]) 01/2017  . Right hemiparesis [facility])     Past Surgical History:  Procedure Laterality Date  . CORONARY ANGIOPLASTY WITH STENT PLACEMENT  11/1999   RCA DES-[facility]  . IR ANGIO INTRA EXTRACRAN SEL COM CAROTID INNOMINATE UNI R MOD SED  02/15/2017  . IR ANGIO VERTEBRAL SEL SUBCLAVIAN INNOMINATE UNI R MOD SED  02/15/2017  . IR INTRAVSC STENT CERV CAROTID W/O EMB-PROT MOD SED INC ANGIO  02/15/2017  . IR PERCUTANEOUS ART THROMBECTOMY/INFUSION INTRACRANIAL INC DIAG ANGIO  02/15/2017  . IR RADIOLOGIST EVAL & MGMT  05/10/2017  . NECK SURGERY    . RADIOLOGY WITH ANESTHESIA N/A 02/14/2017   Performed  by Radiologist, Medication, MD at [facility]      There were no vitals filed for this visit.  Subjective Assessment - 06/20/17 1312    Pertinent History  Recent mild SAH from fall 06/03/17, CVA 02/14/17 PMH: MS for approx 20 years (but no exacerbation for 10 years), A-fib, CAD w/ stent in 2000, white matter dz    Patient Stated Goals  to increase balance    Currently in Pain?  No/denies                   OT Treatments/Exercises (OP) - 06/20/17 0001      ADLs   Functional Mobility  Carrying laundry basket with CGA and cues to attend to Rt side through tight spots. Pt also dragged Rt foot and needed cues to pick up and walk through with Rt foot    Home Maintenance  Folding laundry with close sup to CGA and difficulty folding shirts and hoodies. Washing dishes with decreased thoroughness and occasionally leaving difficult spots on dish with cues to completely clean (even with dominant Lt hand)     ADL Comments  Unable to make sandwich today (no bread available)       Hand Exercises   Other Hand Exercises  Gripper set at level 1 resistance to pick  up blocks Rt hand for sustained grip strength, control, and increased attention/body awareness to Rt side. Pt with multiple drops partly d/t decreased sustained attention. losing proper hand placement, and partly d/t fatigue. Pt required 2 rest breaks                OT Short Term Goals - 05/31/17 1325      OT SHORT TERM GOAL #1   Title  Pt/family independent with coordination and putty HEP - 06/04/2017    Time  4    Period  Weeks    Status  Achieved      OT SHORT TERM GOAL #2   Title  Pt/family independent with HEP for RUE shoulder ROM    Time  4    Period  Weeks    Status  Achieved      OT SHORT TERM GOAL #3   Title  Pt/family to verbalize understanding with safety with shower transfers and bathing with DME/AE prn    Time  4    Period  Weeks    Status  Achieved      OT SHORT TERM GOAL #4   Title   Pt/family to verbalize understanding with memory strategies for sequencing with donning shirt/pants, sequencing and safety with use of walker/rollator, and for medication management    Time  4    Period  Weeks    Status  Achieved      OT SHORT TERM GOAL #5   Title  Pt to improve grip strength by 10 lbs Rt hand     Baseline  22 lbs    Time  4    Period  Weeks    Status  Achieved 05/31/2017 R= 35      OT SHORT TERM GOAL #6   Title  Pt to improve coordination Rt hand as evidenced by performing 9 hole peg test to 100 sec. or less    Baseline  2 min. 6 sec    Time  4    Period  Weeks    Status  Achieved 54.25 sec        OT Long Term Goals - 06/15/17 1206      OT LONG TERM GOAL #1   Title  Pt to retrieve light weight object from high shelf RUE requiring 120 degrees shoulder flexion w/o drops 5/5 trials -07/01/2017    Time  8    Period  Weeks    Status  On-going      OT LONG TERM GOAL #2   Title  Pt to perform simple snack prep and light IADLS (washing dishes, laundry tasks) demo safety without walker with CGA and min cues for safety    Time  8    Period  Weeks    Status  Revised      OT LONG TERM GOAL #3   Title  Pt to implement memory strategies into ADLS including sequencing of donning clothes and safety with transfers with supervision and no more than min cues    Time  8    Period  Weeks    Status  Revised      OT LONG TERM GOAL #4   Title  Pt to improve coordination Rt hand as evidenced by performing 9 hole peg test in 45 sec. or less    Baseline  2 min. 6 sec    Time  8    Period  Weeks    Status  Achieved 06/15/17: 37.38 sec  OT LONG TERM GOAL #5   Title  Pt to tie shoes and style hair mod I level with A/E prn    Time  8    Period  Weeks    Status  Partially Met Pt can tie shoes and wash hair, but unable to style hair w/o assist            Plan - 06/20/17 1401    Clinical Impression Statement  Pt slowly progressing with functional tasks but still  requires direct supervision for cueing d/t cognitive deficits, and CGA for fall prevention.     Rehab Potential  Fair    Current Impairments/barriers affecting progress:  severity of deficits including aphasia, cognition    OT Frequency  2x / week    OT Duration  8 weeks    OT Treatment/Interventions  Self-care/ADL training;DME and/or AE instruction;Patient/family education;Therapeutic exercises;Therapeutic activities;Functional Mobility Training;Passive range of motion;Cognitive remediation/compensation;Visual/perceptual remediation/compensation;Manual Therapy;Moist Heat    Plan  make sandwich if bread available, continue coordination and functional use/reach of RUE (try clothespin activity again)     Consulted and Agree with Plan of Care  Patient;Family member/caregiver    Family Member Consulted  husband-[name]       Patient will benefit from skilled therapeutic intervention in order to improve the following deficits and impairments:  Decreased coordination, Decreased range of motion, Impaired flexibility, Impaired sensation, Decreased safety awareness, Decreased knowledge of precautions, Impaired tone, Impaired UE functional use, Decreased knowledge of use of DME, Decreased mobility, Decreased balance, Decreased cognition, Decreased strength, Impaired perceived functional ability, Impaired vision/preception  Visit Diagnosis: Hemiplegia and hemiparesis following cerebral infarction affecting right non-dominant side (HCC)  Other symptoms and signs involving cognitive functions following cerebral infarction  Unsteadiness on feet  Muscle weakness (generalized)    Problem List Patient Active Problem List   Diagnosis Date Noted  . Subarachnoid hemorrhage ([place]) 06/04/2017  . Multiple sclerosis ([place]) 05/12/2017  . CAD (coronary artery disease) 04/12/2017  . Hypertension 04/11/2017  . Right hemiparesis ([place])   . Aspiration pneumonia of right lung ([place])   . Leukocytosis   . Hypokalemia   .  Labile blood pressure   . Abnormality of gait as late effect of stroke 02/23/2017  . Dysarthria as late effect of stroke 02/23/2017  . Dysphagia as late effect of stroke 02/23/2017  . Acute ischemic left middle cerebral artery (MCA) stroke ([place]) 02/22/2017  . Paroxysmal A-fib ([place]) 02/17/2017  . Atrial fibrillation with RVR ([place]) 02/17/2017  . HLD (hyperlipidemia) 02/17/2017  . Acute hypoxemic respiratory failure ([place])   . Stroke (cerebrum) ([place]) - Acute MCA infarct due to left CCA/ICA and MCA occlusion, s/p mechanical thrombectomy and left ICA stenting in the setting of newly diagnosed afib 02/14/2017    Carey Bullocks, OTR/L 06/20/2017, 2:04 PM  [facility] [address] [place], [place], [address] Phone: [phone]   Fax:  [phone]  Name: Kaitlin [name] MRN: [id number] Date of Birth: [birth year]

## 2017-06-20 NOTE — Therapy (Signed)
[facility] [address] [place], [place], [address] Phone: [phone]   Fax:  [phone]  Speech Language Pathology Treatment  Patient Details  Name: Kaitlin [name] MRN: [id number] Date of Birth: 13-Oct-1945 Referring Provider: Alysia Penna, MD   Encounter Date: 06/20/2017  End of Session - 06/20/17 1812    Visit Number  12    Number of Visits  17    Date for SLP Re-Evaluation  07/11/17    SLP Start Time  1446    SLP Stop Time   4076    SLP Time Calculation (min)  44 min    Activity Tolerance  Patient tolerated treatment well       Past Medical History:  Diagnosis Date  . Abnormality of gait as late effect of stroke 02/23/2017  . Acute hypoxemic respiratory failure ([place])   . Atrial fibrillation with RVR ([place]) 02/17/2017  . CAD S/P percutaneous coronary angioplasty 2001  . CVA (cerebral vascular accident) ([place]) 01/2017  . Dysphagia as late effect of stroke 02/23/2017  . HCVD (hypertensive cardiovascular disease) 01/2017   normal LVF with moderate LVH, grade 1 DD  . High cholesterol   . Hypokalemia   . Labile blood pressure   . Leukocytosis   . Multiple sclerosis ([place])   . PAF (paroxysmal atrial fibrillation) ([place]) 01/2017  . Right hemiparesis [facility])     Past Surgical History:  Procedure Laterality Date  . CORONARY ANGIOPLASTY WITH STENT PLACEMENT  11/1999   RCA DES[facility]  . IR ANGIO INTRA EXTRACRAN SEL COM CAROTID INNOMINATE UNI R MOD SED  02/15/2017  . IR ANGIO VERTEBRAL SEL SUBCLAVIAN INNOMINATE UNI R MOD SED  02/15/2017  . IR INTRAVSC STENT CERV CAROTID W/O EMB-PROT MOD SED INC ANGIO  02/15/2017  . IR PERCUTANEOUS ART THROMBECTOMY/INFUSION INTRACRANIAL INC DIAG ANGIO  02/15/2017  . IR RADIOLOGIST EVAL & MGMT  05/10/2017  . NECK SURGERY    . RADIOLOGY WITH ANESTHESIA N/A 02/14/2017   Performed by Radiologist, Medication, MD at [facility]      There were no vitals filed for this  visit.  Subjective Assessment - 06/20/17 1448    Subjective  "Is this the same as Dr. Deirdre Pippins office?"    Currently in Pain?  No/denies            ADULT SLP TREATMENT - 06/20/17 1449      General Information   Behavior/Cognition  Alert;Cooperative;Pleasant mood;Requires cueing    Patient Positioning  Upright in chair      Treatment Provided   Treatment provided  Cognitive-Linquistic      Pain Assessment   Pain Assessment  No/denies pain      Cognitive-Linquistic Treatment   Treatment focused on  Aphasia    Skilled Treatment  SLP facilitated pt's communication skills in 3x 8 minutes simple to mod complex conversation with pt's husband. Pt demo'd understanding by making appropriate comments, adding details, and answering simple questions re vacations they had taken. SLP cued pt's husband to prompt her for verbal expression via ? cues. With occasional ? cues from husband, pt told a story about her first car. Pt's husband requesting activities for home for expressive language. SLP suggested playing "I Spy" and having pt describe an unnamed object in the room for him to guess. Pt independently volunteered some descriptions (typically shape, color), and SLP demo'd question cues such as "what's it made of?" "what is it used for?"  Assessment / Recommendations / Plan   Plan  Continue with current plan of care      Progression Toward Goals   Progression toward goals  Progressing toward goals       SLP Education - 06/20/17 1811    Education provided  Yes    Education Details  cues to prompt verbal descriptions    Person(s) Educated  Patient;Spouse    Methods  Explanation;Demonstration;Verbal cues    Comprehension  Verbalized understanding;Returned demonstration;Need further instruction       SLP Short Term Goals - 06/20/17 1800      SLP SHORT TERM GOAL #1   Title  pt will generate 8 items in a simple category with compensations allowed    Status  Not Met       SLP SHORT TERM GOAL #2   Title  pt will provide sentence level responses using language compensations 80% success    Status  Not Met      SLP SHORT TERM GOAL #3   Title  pt will demo understanding of 5 minutes mod complex conversation, by providing appropriate responses in conversation 95% over three sessions    Status  Not Met      SLP SHORT TERM GOAL #4   Title  pt will demo appropriate breath support 95% for improvement in voice volume at word level    Status  Deferred       SLP Long Term Goals - 06/20/17 1800      SLP LONG TERM GOAL #1   Title  pt will demo appropriate breath support for adequate speech volume in 10 minutes simple conversation     Time  3    Period  Weeks    Status  On-going      SLP LONG TERM GOAL #2   Title  pt will demo understanding of 8 minutes min-mod complex conversation with request for repeats PRN    Time  3    Period  Weeks    Status  On-going      SLP LONG TERM GOAL #3   Title  Pt will complete mod complex naming tasks with occasional min A    Time  3    Period  Weeks    Status  On-going      SLP LONG TERM GOAL #4   Title  pt will produce 7 minutes simple conversation using compensations for anomia over two sessions    Time  3    Period  Weeks    Status  On-going       Plan - 06/20/17 1812    Clinical Impression Statement  Pt presents today with cont'd expressive aphasia, and receptive aphasia. Pt continues to ask husband for assistance whether in conversation or with specific speech tasks, however husband doing better job at encouraging pt to think of word on her own. Pt has greater ease today describing salient details re: concrete objects vs pictured objects. Pt will cont to receive skilled ST to primarily address aphasia, but also dysarthria (volume) PRN. Volume today was [facility] for all speech tasks. SLP questions whethere pt's difficulty with volume primarily has a behavioral and not neurological component.    Speech Therapy Frequency  2x  / week    Treatment/Interventions  Language facilitation;Internal/external aids;Compensatory techniques;SLP instruction and feedback;Multimodal communcation approach;Cognitive reorganization;Functional tasks;Cueing hierarchy;Compensatory strategies;Patient/family education    Potential to Achieve Goals  Fair    Potential Considerations  Co-morbidities;Severity of impairments;Ability to learn/carryover information;Previous level of  function    Consulted and Agree with Plan of Care  Patient;Family member/caregiver    Family Member Consulted  husband       Patient will benefit from skilled therapeutic intervention in order to improve the following deficits and impairments:   Aphasia  Cognitive communication deficit    Problem List Patient Active Problem List   Diagnosis Date Noted  . Subarachnoid hemorrhage ([place]) 06/04/2017  . Multiple sclerosis ([place]) 05/12/2017  . CAD (coronary artery disease) 04/12/2017  . Hypertension 04/11/2017  . Right hemiparesis ([place])   . Aspiration pneumonia of right lung ([place])   . Leukocytosis   . Hypokalemia   . Labile blood pressure   . Abnormality of gait as late effect of stroke 02/23/2017  . Dysarthria as late effect of stroke 02/23/2017  . Dysphagia as late effect of stroke 02/23/2017  . Acute ischemic left middle cerebral artery (MCA) stroke ([place]) 02/22/2017  . Paroxysmal A-fib ([place]) 02/17/2017  . Atrial fibrillation with RVR ([place]) 02/17/2017  . HLD (hyperlipidemia) 02/17/2017  . Acute hypoxemic respiratory failure ([place])   . Stroke (cerebrum) ([place]) - Acute MCA infarct due to left CCA/ICA and MCA occlusion, s/p mechanical thrombectomy and left ICA stenting in the setting of newly diagnosed afib 02/14/2017   Deneise Lever, [place], [place] Itamar Mcgowan 06/20/2017, [place] [address] [place], [place], [address] Phone: [phone]   Fax:   [phone]   Name: Kaitlin [name] MRN: [id number] Date of Birth: 1946-01-31

## 2017-06-20 NOTE — Therapy (Signed)
[facility] [address] [place], [place], [address] Phone: [phone]   Fax:  [phone]  Physical Therapy Treatment  Patient Details  Name: Kaitlin [name] MRN: [id number] Date of Birth: 03/13/1946 Referring Provider: Alysia Penna MD   Encounter Date: 06/20/2017  PT End of Session - 06/20/17 1531    Visit Number  11    Number of Visits  17    Date for PT Re-Evaluation  07/04/17    Authorization Type  [employer] MCR    Authorization Time Period  05/05/17  to  07/04/17    PT Start Time  1401    PT Stop Time  1445    PT Time Calculation (min)  44 min    Equipment Utilized During Treatment  Gait belt    Activity Tolerance  Patient tolerated treatment well    Behavior During Therapy  [facility] for tasks assessed/performed       Past Medical History:  Diagnosis Date  . Abnormality of gait as late effect of stroke 02/23/2017  . Acute hypoxemic respiratory failure ([place])   . Atrial fibrillation with RVR ([place]) 02/17/2017  . CAD S/P percutaneous coronary angioplasty 2001  . CVA (cerebral vascular accident) ([place]) 01/2017  . Dysphagia as late effect of stroke 02/23/2017  . HCVD (hypertensive cardiovascular disease) 01/2017   normal LVF with moderate LVH, grade 1 DD  . High cholesterol   . Hypokalemia   . Labile blood pressure   . Leukocytosis   . Multiple sclerosis ([place])   . PAF (paroxysmal atrial fibrillation) ([place]) 01/2017  . Right hemiparesis [facility])     Past Surgical History:  Procedure Laterality Date  . CORONARY ANGIOPLASTY WITH STENT PLACEMENT  11/1999   [facility]CA [facility]  . IR ANGIO INTRA EXTRACRAN SEL COM CAROTID INNOMINATE UNI R MOD SED  02/15/2017  . IR ANGIO VERTEBRAL SEL SUBCLAVIAN INNOMINATE UNI R MOD SED  02/15/2017  . IR INTRAVSC STENT CERV CAROTID W/O EMB-PROT MOD SED INC ANGIO  02/15/2017  . IR PERCUTANEOUS ART THROMBECTOMY/INFUSION INTRACRANIAL INC DIAG ANGIO  02/15/2017  . IR RADIOLOGIST EVAL & MGMT  05/10/2017   . NECK SURGERY    . RADIOLOGY WITH ANESTHESIA N/A 02/14/2017   Performed by Radiologist, Medication, MD at [facility]      There were no vitals filed for this visit.  Subjective Assessment - 06/20/17 1533    Subjective  Pt came to clinic wearing Rt leg brace and RW. Pt reports no pain, no falls, and no complaints since last session.    Patient is accompained by:  Family member    Patient Stated Goals  "Standing up straight; getting my self-control" (balance)    Currently in Pain?  No/denies        [facility] Adult PT Treatment/Exercise - 06/20/17 1729      Ambulation/Gait   Ambulation/Gait  Yes    Ambulation/Gait Assistance  4: Min assist    Ambulation/Gait Assistance Details  Ambulated 35 ft. with RW with Min guard, providing VC's to improve form; ambulated 50 ft. without AD with Min Assist and HHA for safety. Pt ambulates with decreased right foot clearance.    Ambulation Distance (Feet)  100 Feet    Assistive device  Rolling walker    Gait Pattern  Step-through pattern;Decreased dorsiflexion - right;Decreased hip/knee flexion - right;Poor foot clearance - right;Decreased stride length    Ambulation Surface  Level;Indoor      Neuro Re-ed  Neuro Re-ed Details   Pt performed right and left toe tapping on foam semicircles, alternating LE in straight and diagonal patterns; pt stepped over foam semicircles x20; to improve pt balance. Performed in // bars with no UE support and Min Assist provided. Pt performed balance exercises in corner with chair in front for safety and support. Activities included standing on unlevel surface with EC, directional head turn movements to right/left, up/down, and diagonal to improve balance and proprioception. Pt used light chair support for SLS and EO for 10 sec holds.     Pt performed postural control and weight shifting on physioball and green gel cushion for balance and COG awareness. Max VC's needed for comprehension of activity.       Knee/Hip Exercises: Seated   Long Arc Quad  Right;Left;1 set;10 reps;Strengthening         PT Short Term Goals - 06/08/17 1418      PT SHORT TERM GOAL #1   Title  Patient (with supervision to min-guard assistance of her spouse) will perform basic HEP for balance and strengthening. (Target STGs 06/04/17)    Baseline  05/30/17: goals achieved    Time  4    Period  Weeks    Status  Achieved      PT SHORT TERM GOAL #2   Title  Patient will complete FGA with STG and LTG set as appropriate.    Baseline  10/10 FGA 9/30; 10/16 goal set    Time  1    Period  Weeks    Status  Achieved      PT SHORT TERM GOAL #3   Title  Patient will improve TUG with or without a device to <15 seconds to demonstrate a lesser fall risk.     Baseline  rollator 18.25; no device 17.81 sec. 05/30/17: No AD 18.10/ RW 26.59    Time  4    Period  Weeks    Status  Not Met      PT SHORT TERM GOAL #4   Title  Patient will improve her gait velocity to 2.62 ft/sec to indicate lesser fall risk with community ambulation.     Baseline  05/30/17: 2.04 ft/sec without AD 1.75f/sec with AD    Time  4    Period  Weeks    Status  Not Met      PT SHORT TERM GOAL #5   Title  Patient will improve FGA score to >=14/30 to demonstrate progress towards lesser fall risk    Baseline  05/31/17: Pt improved score to 16/30    Time  3    Period  Weeks    Status  Achieved        PT Long Term Goals - 06/08/17 1418      PT LONG TERM GOAL #1   Title  Patient and/or husband will be able to verbalize signs and symptoms of CVA and appropriate action to take. (Target LTGs: 07/04/17)    Time  8    Period  Weeks    Status  New      PT LONG TERM GOAL #2   Title  Patient (with supervision of husband) will perform updated HEP    Time  8    Period  Weeks    Status  New      PT LONG TERM GOAL #3   Title  Patient will ambulate >=2.79 ft/sec (age based norm) with LRAD or no device on level surface.  Time  8    Period  Weeks     Status  New      PT LONG TERM GOAL #4   Title  Patient will ambulate >= 1000 ft on outdoor surfaces (level and unlevel, grass, gravel, mulch) with LRAD and supervision.     Time  8    Period  Weeks    Status  New      PT LONG TERM GOAL #5   Title  Patient will perform TUG <13.5 seconds to demonstrate lesser fall risk.    Time  8    Period  Weeks    Status  New      PT LONG TERM GOAL #6   Title  Patient will improve FGA score by 4 points above her score assessed at STGs (TBA).    Baseline  10/16 goal set per STG set on eval    Time  [place] - 06/20/17 1714    Clinical Impression Statement  Session focused on balance training. Pt required max cues and assistance to prevent falls and to understand instructions. Pt challenged on balance exercises with feet together and eyes closed with head rotations. Pt will continue to benefit from further PT sessions to improve balance deficits and strengthen LE to become safer and more functional at home and in the community.    Rehab Potential  Good    Clinical Impairments Affecting Rehab Potential  impulsivity and decr awareness of deficits    PT Frequency  2x / week    PT Duration  8 weeks    PT Treatment/Interventions  ADLs/Self Care Home Management;DME Instruction;Gait training;Stair training;Functional mobility training;Orthotic Fit/Training;Patient/family education;Cognitive remediation;Neuromuscular re-education;Balance training;Therapeutic exercise;Therapeutic activities    PT Next Visit Plan  Continue to work on gait training without AD, balance exercises on compliant surfaces, LE strengthening.       Patient will benefit from skilled therapeutic intervention in order to improve the following deficits and impairments:  Abnormal gait, Decreased balance, Decreased cognition, Decreased knowledge of use of DME, Decreased mobility, Decreased safety awareness, Decreased strength, Difficulty walking,  Impaired perceived functional ability, Impaired sensation, Impaired UE functional use  Visit Diagnosis: Unsteadiness on feet  Muscle weakness (generalized)  Other abnormalities of gait and mobility     Problem List Patient Active Problem List   Diagnosis Date Noted  . Subarachnoid hemorrhage ([place]) 06/04/2017  . Multiple sclerosis ([place]) 05/12/2017  . CAD (coronary artery disease) 04/12/2017  . Hypertension 04/11/2017  . Right hemiparesis ([place])   . Aspiration pneumonia of right lung ([place])   . Leukocytosis   . Hypokalemia   . Labile blood pressure   . Abnormality of gait as late effect of stroke 02/23/2017  . Dysarthria as late effect of stroke 02/23/2017  . Dysphagia as late effect of stroke 02/23/2017  . Acute ischemic left middle cerebral artery (MCA) stroke ([place]) 02/22/2017  . Paroxysmal A-fib ([place]) 02/17/2017  . Atrial fibrillation with RVR ([place]) 02/17/2017  . HLD (hyperlipidemia) 02/17/2017  . Acute hypoxemic respiratory failure ([place])   . Stroke (cerebrum) ([place]) - Acute MCA infarct due to left CCA/ICA and MCA occlusion, s/p mechanical thrombectomy and left ICA stenting in the setting of newly diagnosed afib 02/14/2017    Andria Meuse, SPTA 06/20/2017, 5:46 PM  [facility] [address] [place], [place], [address] Phone: [phone]  Fax:  [phone]  Name: Kaitlin [name] MRN: [id number] Date of Birth: 1946/05/16

## 2017-06-21 ENCOUNTER — Ambulatory Visit: Payer: Medicare Other | Admitting: Occupational Therapy

## 2017-06-21 ENCOUNTER — Ambulatory Visit: Payer: Medicare Other | Admitting: Speech Pathology

## 2017-06-21 ENCOUNTER — Encounter: Payer: Self-pay | Admitting: Physical Therapy

## 2017-06-21 ENCOUNTER — Ambulatory Visit: Payer: Medicare Other | Admitting: Physical Therapy

## 2017-06-21 DIAGNOSIS — R4701 Aphasia: Secondary | ICD-10-CM

## 2017-06-21 DIAGNOSIS — R2681 Unsteadiness on feet: Secondary | ICD-10-CM

## 2017-06-21 DIAGNOSIS — I69353 Hemiplegia and hemiparesis following cerebral infarction affecting right non-dominant side: Secondary | ICD-10-CM

## 2017-06-21 DIAGNOSIS — R2689 Other abnormalities of gait and mobility: Secondary | ICD-10-CM

## 2017-06-21 DIAGNOSIS — M6281 Muscle weakness (generalized): Secondary | ICD-10-CM

## 2017-06-21 DIAGNOSIS — R41841 Cognitive communication deficit: Secondary | ICD-10-CM

## 2017-06-21 DIAGNOSIS — I69318 Other symptoms and signs involving cognitive functions following cerebral infarction: Secondary | ICD-10-CM

## 2017-06-21 NOTE — Therapy (Signed)
[facility] [address] [place], [place], [address] Phone: [phone]   Fax:  [phone]  Occupational Therapy Treatment  Patient Details  Name: Kaitlin [name] MRN: [id number] Date of Birth: 11/25/1945 Referring Provider: Dr. Letta Pate   Encounter Date: 06/21/2017  OT End of Session - 06/21/17 1417    Visit Number  14    Number of Visits  17    Date for OT Re-Evaluation  07/04/17    Authorization Type  [employer] MCR - G code needed    Authorization - Visit Number  14    Authorization - Number of Visits  20    OT Start Time  8841    OT Stop Time  1400    OT Time Calculation (min)  45 min    Activity Tolerance  Patient tolerated treatment well       Past Medical History:  Diagnosis Date  . Abnormality of gait as late effect of stroke 02/23/2017  . Acute hypoxemic respiratory failure ([place])   . Atrial fibrillation with RVR ([place]) 02/17/2017  . CAD S/P percutaneous coronary angioplasty 2001  . CVA (cerebral vascular accident) ([place]) 01/2017  . Dysphagia as late effect of stroke 02/23/2017  . HCVD (hypertensive cardiovascular disease) 01/2017   normal LVF with moderate LVH, grade 1 DD  . High cholesterol   . Hypokalemia   . Labile blood pressure   . Leukocytosis   . Multiple sclerosis ([place])   . PAF (paroxysmal atrial fibrillation) ([place]) 01/2017  . Right hemiparesis [facility])     Past Surgical History:  Procedure Laterality Date  . CORONARY ANGIOPLASTY WITH STENT PLACEMENT  11/1999   RCA DES-[facility]  . IR ANGIO INTRA EXTRACRAN SEL COM CAROTID INNOMINATE UNI R MOD SED  02/15/2017  . IR ANGIO VERTEBRAL SEL SUBCLAVIAN INNOMINATE UNI R MOD SED  02/15/2017  . IR INTRAVSC STENT CERV CAROTID W/O EMB-PROT MOD SED INC ANGIO  02/15/2017  . IR PERCUTANEOUS ART THROMBECTOMY/INFUSION INTRACRANIAL INC DIAG ANGIO  02/15/2017  . IR RADIOLOGIST EVAL & MGMT  05/10/2017  . NECK SURGERY    . RADIOLOGY WITH ANESTHESIA N/A 02/14/2017   Procedure: RADIOLOGY WITH ANESTHESIA;  Surgeon: Radiologist, Medication, MD;  Location: [facility];  Service: Radiology;  Laterality: N/A;  . WRIST SURGERY      There were no vitals filed for this visit.  Subjective Assessment - 06/21/17 1334    Subjective   Per husband, "Do you think the cognition and safety will get better?"    Pertinent History  Recent mild SAH from fall 06/03/17, CVA 02/14/17 PMH: MS for approx 20 years (but no exacerbation for 10 years), A-fib, CAD w/ stent in 2000, white matter dz    Patient Stated Goals  to increase balance    Currently in Pain?  No/denies                   OT Treatments/Exercises (OP) - 06/21/17 0001      ADLs   Functional Mobility  Functional mobility negotiating through tight spaces w/o walker with hand held assist and mod cueing to attend to Rt side to prevent bumping and tripping over things on Rt. Pt then progressed to holding plate in Rt hand with hand held assist (Lt hand)    ADL Comments  Discussed with pt/husband plans to d/c O.T. by the end of next week and reasoning behind d/c.       Fine Motor Coordination   Other Fine Motor Exercises  Pt stringing pegs to copy design for bilateral integration and fine motor control. Pt incoporated Rt hand, but required cues to use Rt hand to get pegs out of bag even when placed to pt's Rt side.       Functional Reaching Activities   High Level  Retrieving and replacing varied resistance clothespins on antenna RUE with cues to use Rt hand intermittently. Pt would often try and switch to Lt dominant hand d/t Rt neglect.                OT Short Term Goals - 05/31/17 1325      OT SHORT TERM GOAL #1   Title  Pt/family independent with coordination and putty HEP - 06/04/2017    Time  4    Period  Weeks    Status  Achieved      OT SHORT TERM GOAL #2   Title  Pt/family independent with HEP for RUE shoulder ROM    Time  4    Period  Weeks    Status  Achieved      OT SHORT TERM GOAL #3    Title  Pt/family to verbalize understanding with safety with shower transfers and bathing with DME/AE prn    Time  4    Period  Weeks    Status  Achieved      OT SHORT TERM GOAL #4   Title  Pt/family to verbalize understanding with memory strategies for sequencing with donning shirt/pants, sequencing and safety with use of walker/rollator, and for medication management    Time  4    Period  Weeks    Status  Achieved      OT SHORT TERM GOAL #5   Title  Pt to improve grip strength by 10 lbs Rt hand     Baseline  22 lbs    Time  4    Period  Weeks    Status  Achieved 05/31/2017 R= 35      OT SHORT TERM GOAL #6   Title  Pt to improve coordination Rt hand as evidenced by performing 9 hole peg test to 100 sec. or less    Baseline  2 min. 6 sec    Time  4    Period  Weeks    Status  Achieved 54.25 sec        OT Long Term Goals - 06/15/17 1206      OT LONG TERM GOAL #1   Title  Pt to retrieve light weight object from high shelf RUE requiring 120 degrees shoulder flexion w/o drops 5/5 trials -07/01/2017    Time  8    Period  Weeks    Status  On-going      OT LONG TERM GOAL #2   Title  Pt to perform simple snack prep and light IADLS (washing dishes, laundry tasks) demo safety without walker with CGA and min cues for safety    Time  8    Period  Weeks    Status  Revised      OT LONG TERM GOAL #3   Title  Pt to implement memory strategies into ADLS including sequencing of donning clothes and safety with transfers with supervision and no more than min cues    Time  8    Period  Weeks    Status  Revised      OT LONG TERM GOAL #4   Title  Pt to improve coordination Rt hand as evidenced  by performing 9 hole peg test in 45 sec. or less    Baseline  2 min. 6 sec    Time  8    Period  Weeks    Status  Achieved 06/15/17: 37.38 sec      OT LONG TERM GOAL #5   Title  Pt to tie shoes and style hair mod I level with A/E prn    Time  8    Period  Weeks    Status  Partially Met Pt  can tie shoes and wash hair, but unable to style hair w/o assist            Plan - 06/21/17 1417    Clinical Impression Statement  Pt limited by decreased safety, Rt neglect, and Rt LE weakness and at high risk for falls - requires CGA for functional mobility to prevent falls.     Rehab Potential  Fair    Current Impairments/barriers affecting progress:  severity of deficits including aphasia, cognition    OT Frequency  2x / week    OT Duration  8 weeks    OT Treatment/Interventions  Self-care/ADL training;DME and/or AE instruction;Patient/family education;Therapeutic exercises;Therapeutic activities;Functional Mobility Training;Passive range of motion;Cognitive remediation/compensation;Visual/perceptual remediation/compensation;Manual Therapy;Moist Heat    Plan  make sandwich, begin assessing remaining LTG's and d/c by end of next week     Consulted and Agree with Plan of Care  Patient;Family member/caregiver    Family Member Consulted  husband-[name]       Patient will benefit from skilled therapeutic intervention in order to improve the following deficits and impairments:  Decreased coordination, Decreased range of motion, Impaired flexibility, Impaired sensation, Decreased safety awareness, Decreased knowledge of precautions, Impaired tone, Impaired UE functional use, Decreased knowledge of use of DME, Decreased mobility, Decreased balance, Decreased cognition, Decreased strength, Impaired perceived functional ability, Impaired vision/preception  Visit Diagnosis: Hemiplegia and hemiparesis following cerebral infarction affecting right non-dominant side (HCC)  Other symptoms and signs involving cognitive functions following cerebral infarction  Unsteadiness on feet    Problem List Patient Active Problem List   Diagnosis Date Noted  . Subarachnoid hemorrhage ([place]) 06/04/2017  . Multiple sclerosis ([place]) 05/12/2017  . CAD (coronary artery disease) 04/12/2017  . Hypertension  04/11/2017  . Right hemiparesis ([place])   . Aspiration pneumonia of right lung ([place])   . Leukocytosis   . Hypokalemia   . Labile blood pressure   . Abnormality of gait as late effect of stroke 02/23/2017  . Dysarthria as late effect of stroke 02/23/2017  . Dysphagia as late effect of stroke 02/23/2017  . Acute ischemic left middle cerebral artery (MCA) stroke ([place]) 02/22/2017  . Paroxysmal A-fib ([place]) 02/17/2017  . Atrial fibrillation with RVR ([place]) 02/17/2017  . HLD (hyperlipidemia) 02/17/2017  . Acute hypoxemic respiratory failure ([place])   . Stroke (cerebrum) ([place]) - Acute MCA infarct due to left CCA/ICA and MCA occlusion, s/p mechanical thrombectomy and left ICA stenting in the setting of newly diagnosed afib 02/14/2017    Carey Bullocks, OTR/L 06/21/2017, 2:20 PM  [facility] [address] [place], [place], [address] Phone: [phone]   Fax:  [phone]  Name: Genise Strack MRN: [id number] Date of Birth: 12-19-1945

## 2017-06-21 NOTE — Therapy (Signed)
[facility] [address] [place], [place], [address] Phone: [phone]   Fax:  [phone]  Speech Language Pathology Treatment  Patient Details  Name: Kaitlin [name] MRN: [id number] Date of Birth: 1946/02/26 Referring Provider: Alysia Penna, MD   Encounter Date: 06/21/2017  End of Session - 06/21/17 1527    Visit Number  13    Number of Visits  17    Date for SLP Re-Evaluation  07/11/17    SLP Start Time  27    SLP Stop Time   1446    SLP Time Calculation (min)  44 min    Activity Tolerance  Patient tolerated treatment well       Past Medical History:  Diagnosis Date  . Abnormality of gait as late effect of stroke 02/23/2017  . Acute hypoxemic respiratory failure ([place])   . Atrial fibrillation with RVR ([place]) 02/17/2017  . CAD S/P percutaneous coronary angioplasty 2001  . CVA (cerebral vascular accident) ([place]) 01/2017  . Dysphagia as late effect of stroke 02/23/2017  . HCVD (hypertensive cardiovascular disease) 01/2017   normal LVF with moderate LVH, grade 1 DD  . High cholesterol   . Hypokalemia   . Labile blood pressure   . Leukocytosis   . Multiple sclerosis ([place])   . PAF (paroxysmal atrial fibrillation) ([place]) 01/2017  . Right hemiparesis [facility])     Past Surgical History:  Procedure Laterality Date  . CORONARY ANGIOPLASTY WITH STENT PLACEMENT  11/1999   [facility]CA [facility]  . IR ANGIO INTRA EXTRACRAN SEL COM CAROTID INNOMINATE UNI R MOD SED  02/15/2017  . IR ANGIO VERTEBRAL SEL SUBCLAVIAN INNOMINATE UNI R MOD SED  02/15/2017  . IR INTRAVSC STENT CERV CAROTID W/O EMB-PROT MOD SED INC ANGIO  02/15/2017  . IR PERCUTANEOUS ART THROMBECTOMY/INFUSION INTRACRANIAL INC DIAG ANGIO  02/15/2017  . IR RADIOLOGIST EVAL & MGMT  05/10/2017  . NECK SURGERY    . RADIOLOGY WITH ANESTHESIA N/A 02/14/2017   Procedure: RADIOLOGY WITH ANESTHESIA;  Surgeon: Radiologist, Medication, MD;  Location: [facility];  Service: Radiology;   Laterality: N/A;  . WRIST SURGERY      There were no vitals filed for this visit.  Subjective Assessment - 06/21/17 1455    Subjective  "We have enough homework - Stanton Kidney gave [place] a packet"    Patient is accompained by:  Family member spouse, [name]    Currently in Pain?  No/denies            ADULT SLP TREATMENT - 06/21/17 1456      General Information   Behavior/Cognition  Alert;Cooperative;Pleasant mood;Requires cueing    Patient Positioning  Upright in bed      Treatment Provided   Treatment provided  Cognitive-Linquistic      Pain Assessment   Pain Assessment  No/denies pain      Cognitive-Linquistic Treatment   Treatment focused on  Aphasia    Skilled Treatment  Facilitated verbal expression generating similarities and differences in simple  related objects with usual mod A. Naming basic objects f:4 and stating which doesn't belong with mod A. Name 3-4 items for simple category with usual mod questioning cues, semantic, phonemic and written cues.       Assessment / Recommendations / Plan   Plan  Continue with current plan of care      Progression Toward Goals   Progression toward goals  Progressing toward goals       SLP Education - 06/20/17 1811  Education provided  Yes    Education Details  cues to prompt verbal descriptions    Person(s) Educated  Patient;Spouse    Methods  Explanation;Demonstration;Verbal cues    Comprehension  Verbalized understanding;Returned demonstration;Need further instruction       SLP Short Term Goals - 06/21/17 1527      SLP SHORT TERM GOAL #1   Title  pt will generate 8 items in a simple category with compensations allowed    Status  Not Met      SLP SHORT TERM GOAL #2   Title  pt will provide sentence level responses using language compensations 80% success    Status  Not Met      SLP SHORT TERM GOAL #3   Title  pt will demo understanding of 5 minutes mod complex conversation, by providing appropriate responses in  conversation 95% over three sessions    Status  Not Met      SLP SHORT TERM GOAL #4   Title  pt will demo appropriate breath support 95% for improvement in voice volume at word level    Status  Deferred       SLP Long Term Goals - 06/21/17 1527      SLP LONG TERM GOAL #1   Title  pt will demo appropriate breath support for adequate speech volume in 10 minutes simple conversation     Time  3    Period  Weeks    Status  On-going      SLP LONG TERM GOAL #2   Title  pt will demo understanding of 8 minutes min-mod complex conversation with request for repeats PRN    Time  3    Period  Weeks    Status  On-going      SLP LONG TERM GOAL #3   Title  Pt will complete mod complex naming tasks with occasional min A    Time  3    Period  Weeks    Status  On-going      SLP LONG TERM GOAL #4   Title  pt will produce 7 minutes simple conversation using compensations for anomia over two sessions    Time  3    Period  Weeks    Status  On-going       Plan - 06/21/17 1459    Clinical Impression Statement  Pt presents today with cont'd expressive aphasia, and receptive aphasia. Pt continues to ask husband for assistance whether in conversation or with specific speech tasks, however husband doing better job at encouraging pt to think of word on her own. Pt has greater ease today describing salient details re: concrete objects vs pictured objects. Pt will cont to receive skilled ST to primarily address aphasia, but also dysarthria (volume) PRN. Volume today was [employer] for all speech tasks. SLP questions whethere pt's difficulty with volume primarily has a behavioral and not neurological component.    Speech Therapy Frequency  2x / week    Treatment/Interventions  Language facilitation;Internal/external aids;Compensatory techniques;SLP instruction and feedback;Multimodal communcation approach;Cognitive reorganization;Functional tasks;Cueing hierarchy;Compensatory strategies;Patient/family education     Potential to Achieve Goals  Fair    Potential Considerations  Co-morbidities;Severity of impairments;Ability to learn/carryover information;Previous level of function    Consulted and Agree with Plan of Care  Patient;Family member/caregiver    Family Member Consulted  husband, [name]       Patient will benefit from skilled therapeutic intervention in order to improve the following deficits and impairments:  Aphasia  Cognitive communication deficit    Problem List Patient Active Problem List   Diagnosis Date Noted  . Subarachnoid hemorrhage ([place]) 06/04/2017  . Multiple sclerosis ([place]) 05/12/2017  . CAD (coronary artery disease) 04/12/2017  . Hypertension 04/11/2017  . Right hemiparesis ([place])   . Aspiration pneumonia of right lung ([place])   . Leukocytosis   . Hypokalemia   . Labile blood pressure   . Abnormality of gait as late effect of stroke 02/23/2017  . Dysarthria as late effect of stroke 02/23/2017  . Dysphagia as late effect of stroke 02/23/2017  . Acute ischemic left middle cerebral artery (MCA) stroke ([place]) 02/22/2017  . Paroxysmal A-fib ([place]) 02/17/2017  . Atrial fibrillation with RVR ([place]) 02/17/2017  . HLD (hyperlipidemia) 02/17/2017  . Acute hypoxemic respiratory failure ([place])   . Stroke (cerebrum) ([place]) - Acute MCA infarct due to left CCA/ICA and MCA occlusion, s/p mechanical thrombectomy and left ICA stenting in the setting of newly diagnosed afib 02/14/2017    Dafna Romo, Annye Rusk MS, CCC-SLP 06/21/2017, 3:28 PM  [facility] [address] [place], [place], [address] Phone: [phone]   Fax:  [phone]   Name: Ciel Chervenak MRN: [id number] Date of Birth: Mar 30, 1946

## 2017-06-21 NOTE — Therapy (Signed)
[facility] [address] [place], [place], [address] Phone: [phone]   Fax:  [phone]  Physical Therapy Treatment  Patient Details  Name: Kaitlin [name] MRN: [id number] Date of Birth: [birth year] Referring Provider: Alysia Penna MD   Encounter Date: 06/21/2017  PT End of Session - 06/21/17 1612    Visit Number  12    Number of Visits  17    Date for PT Re-Evaluation  07/04/17    Authorization Type  UHC MCR    Authorization Time Period  05/05/17  to  07/04/17    PT Start Time  1446    PT Stop Time  1525    PT Time Calculation (min)  39 min    Equipment Utilized During Treatment  Gait belt    Activity Tolerance  Patient limited by fatigue reported throughout session she felt very tired    Behavior During Therapy  [facility] for tasks assessed/performed       Past Medical History:  Diagnosis Date  . Abnormality of gait as late effect of stroke 02/23/2017  . Acute hypoxemic respiratory failure ([place])   . Atrial fibrillation with RVR ([place]) 02/17/2017  . CAD S/P percutaneous coronary angioplasty 2001  . CVA (cerebral vascular accident) ([place]) 01/2017  . Dysphagia as late effect of stroke 02/23/2017  . HCVD (hypertensive cardiovascular disease) 01/2017   normal LVF with moderate LVH, grade 1 DD  . High cholesterol   . Hypokalemia   . Labile blood pressure   . Leukocytosis   . Multiple sclerosis ([place])   . PAF (paroxysmal atrial fibrillation) ([place]) 01/2017  . Right hemiparesis [facility])     Past Surgical History:  Procedure Laterality Date  . CORONARY ANGIOPLASTY WITH STENT PLACEMENT  11/1999   RCA DES-[facility]  . IR ANGIO INTRA EXTRACRAN SEL COM CAROTID INNOMINATE UNI R MOD SED  02/15/2017  . IR ANGIO VERTEBRAL SEL SUBCLAVIAN INNOMINATE UNI R MOD SED  02/15/2017  . IR INTRAVSC STENT CERV CAROTID W/O EMB-PROT MOD SED INC ANGIO  02/15/2017  . IR PERCUTANEOUS ART THROMBECTOMY/INFUSION INTRACRANIAL INC DIAG ANGIO  02/15/2017   . IR RADIOLOGIST EVAL & MGMT  05/10/2017  . NECK SURGERY    . RADIOLOGY WITH ANESTHESIA N/A 02/14/2017   Procedure: RADIOLOGY WITH ANESTHESIA;  Surgeon: Radiologist, Medication, MD;  Location: [facility];  Service: Radiology;  Laterality: N/A;  . WRIST SURGERY      There were no vitals filed for this visit.  Subjective Assessment - 06/21/17 1449    Subjective  Husband reports they continue to use the RW at home in "wide open areas" like the hall, their bedroom and even her bathroom. In areas that are more congested/tight, she does not use the RW and he is right there with her (she often holds onto the counter). He then states that after dinner, she uses her RW and goes into her bathroom and spends about 10 minutes brushing, flossing her teeth and her invisaligns. He states she is standing at the counter with the RW in front of her and he let's her do this alone. He reports she does not really use the wheelchair anymore except to sit in for eating meals (the bar stools are too "tippy" and the w/c is the right height for her tray table.     Patient is accompained by:  Family member    Patient Stated Goals  "Standing up straight; getting my self-control" (balance)    Currently in Pain?  No/denies  [facility] Adult PT Treatment/Exercise - 06/21/17 1535      Transfers   Sit to Stand  4: Min guard;With upper extremity assist;With armrests;From chair/3-in-1 feet on airex pad working on balance; slow descent    Stand to Sit Details  minguard with or without RW (she continues to park RW off to the side and then step around the walker to get to the chair (with high likelihood of     Number of Reps  10 reps;1 set      Ambulation/Gait   Ambulation/Gait Assistance  4: Min guard;4: Min assist    Ambulation/Gait Assistance Details  no device workng on heelstrike, scanning to her right; velocity; pt with one LOB requiring assist and 4 slight stagger steps where she recovered on her  own    Ambulation Distance (Feet)  480 Feet    Assistive device  None    Gait Pattern  Step-through pattern;Decreased dorsiflexion - right;Poor foot clearance - right;Decreased stride length;Right foot flat;Left foot flat;Right flexed knee in stance;Left flexed knee in stance;Shuffle    Ambulation Surface  Level    Gait velocity  pt self-selects very slow speed and required vc to incr velocity to pace that improved her balance    Ramp  Other (comment) minguard for safety due to poor foot clearanace    Ramp Details (indicate cue type and reason)  x 2    Curb  -- minguard for safety    Curb Details (indicate cue type and reason)  x 2          Balance Exercises - 06/21/17 1601      Balance Exercises: Standing   Retro Gait  Upper extremity support;4 reps // bars    Sidestepping  Upper extremity support;4 reps        PT Education - 06/21/17 1602    Education provided  Yes    Education Details  Do not use RW in bathroom (previously husband has reported 2 falls due to getting tangled up with the RW) and MUST stay with her while she is standing and working on her teeth. With husband listening, quizzed patient on her deficits that are making her walking less safe. She could only identify need to lift Rt foot more to improve clearance (which we had just been working on). With multiple cues, she could not identify that her vision and field cut on the right were an issue.     Person(s) Educated  Patient;Spouse    Methods  Explanation;Demonstration;Verbal cues    Comprehension  Verbalized understanding;Returned demonstration;Verbal cues required;Need further instruction       PT Short Term Goals - 06/08/17 1418      PT SHORT TERM GOAL #1   Title  Patient (with supervision to min-guard assistance of her spouse) will perform basic HEP for balance and strengthening. (Target STGs 06/04/17)    Baseline  05/30/17: goals achieved    Time  4    Period  Weeks    Status  Achieved      PT SHORT  TERM GOAL #2   Title  Patient will complete FGA with STG and LTG set as appropriate.    Baseline  10/10 FGA 9/30; 10/16 goal set    Time  1    Period  Weeks    Status  Achieved      PT SHORT TERM GOAL #3   Title  Patient will improve TUG with or without a device to <15 seconds to demonstrate a lesser  fall risk.     Baseline  rollator 18.25; no device 17.81 sec. 05/30/17: No AD 18.10/ RW 26.59    Time  4    Period  Weeks    Status  Not Met      PT SHORT TERM GOAL #4   Title  Patient will improve her gait velocity to 2.62 ft/sec to indicate lesser fall risk with community ambulation.     Baseline  05/30/17: 2.04 ft/sec without AD 1.65f/sec with AD    Time  4    Period  Weeks    Status  Not Met      PT SHORT TERM GOAL #5   Title  Patient will improve FGA score to >=14/30 to demonstrate progress towards lesser fall risk    Baseline  05/31/17: Pt improved score to 16/30    Time  3    Period  Weeks    Status  Achieved        PT Long Term Goals - 06/08/17 1418      PT LONG TERM GOAL #1   Title  Patient and/or husband will be able to verbalize signs and symptoms of CVA and appropriate action to take. (Target LTGs: 07/04/17)    Time  8    Period  Weeks    Status  New      PT LONG TERM GOAL #2   Title  Patient (with supervision of husband) will perform updated HEP    Time  8    Period  Weeks    Status  New      PT LONG TERM GOAL #3   Title  Patient will ambulate >=2.79 ft/sec (age based norm) with LRAD or no device on level surface.    Time  8    Period  Weeks    Status  New      PT LONG TERM GOAL #4   Title  Patient will ambulate >= 1000 ft on outdoor surfaces (level and unlevel, grass, gravel, mulch) with LRAD and supervision.     Time  8    Period  Weeks    Status  New      PT LONG TERM GOAL #5   Title  Patient will perform TUG <13.5 seconds to demonstrate lesser fall risk.    Time  8    Period  Weeks    Status  New      PT LONG TERM GOAL #6   Title  Patient  will improve FGA score by 4 points above her score assessed at STGs (TBA).    Baseline  10/16 goal set per STG set on eval    Time  7    Period  Weeks    Status  New            Plan - 06/21/17 1614    Clinical Impression Statement  Initial portion of session focused on discussion re: safety at home and use of RW vs no device. Despite previous recommendations to NOT use RW in the home and for husband to be with her at all times, they have not done this. Again discussed the high risk of injury if she falls and need to be with her whenever she is up on her feet. Husband agreed to not use the RW in her bathroom and be with her. Patient's decreased cognition very apparent today and appeared husband was taken aback by her very poor insight into her visual deficits. Will shift focus to educating  husband on how to cue patient to increase safety as pt is not showing carryover of education. Anticipate discharge from PT at the end of this certification (which concludes 07/04/17 with next two visits being her last). Will discuss with patient and husband next visit.     Rehab Potential  Good    Clinical Impairments Affecting Rehab Potential  impulsivity and decr awareness of deficits    PT Frequency  2x / week    PT Duration  8 weeks    PT Treatment/Interventions  ADLs/Self Care Home Management;DME Instruction;Gait training;Stair training;Functional mobility training;Orthotic Fit/Training;Patient/family education;Cognitive remediation;Neuromuscular re-education;Balance training;Therapeutic exercise;Therapeutic activities    PT Next Visit Plan  Discuss discharge either 11/29 or 12/6. Focus on educating husband on cues during gait (have taught him to cue heelstrike--?he remembers). Finalize/update HEP as approp. Continue to work on gait training NO AD, balance exercises on compliant surfaces, LE strengthening.    Consulted and Agree with Plan of Care  Patient;Family member/caregiver    Family Member Consulted   spouse, [name]       Patient will benefit from skilled therapeutic intervention in order to improve the following deficits and impairments:  Abnormal gait, Decreased balance, Decreased cognition, Decreased knowledge of use of DME, Decreased mobility, Decreased safety awareness, Decreased strength, Difficulty walking, Impaired perceived functional ability, Impaired sensation, Impaired UE functional use  Visit Diagnosis: Unsteadiness on feet  Other abnormalities of gait and mobility  Muscle weakness (generalized)     Problem List Patient Active Problem List   Diagnosis Date Noted  . Subarachnoid hemorrhage ([place]) 06/04/2017  . Multiple sclerosis ([place]) 05/12/2017  . CAD (coronary artery disease) 04/12/2017  . Hypertension 04/11/2017  . Right hemiparesis ([place])   . Aspiration pneumonia of right lung ([place])   . Leukocytosis   . Hypokalemia   . Labile blood pressure   . Abnormality of gait as late effect of stroke 02/23/2017  . Dysarthria as late effect of stroke 02/23/2017  . Dysphagia as late effect of stroke 02/23/2017  . Acute ischemic left middle cerebral artery (MCA) stroke ([place]) 02/22/2017  . Paroxysmal A-fib ([place]) 02/17/2017  . Atrial fibrillation with RVR ([place]) 02/17/2017  . HLD (hyperlipidemia) 02/17/2017  . Acute hypoxemic respiratory failure ([place])   . Stroke (cerebrum) ([place]) - Acute MCA infarct due to left CCA/ICA and MCA occlusion, s/p mechanical thrombectomy and left ICA stenting in the setting of newly diagnosed afib 02/14/2017    Rexanne Mano, PT 06/21/2017, 4:28 PM  [facility] [address] [place], [place], [address] Phone: [phone]   Fax:  [phone]  Name: Kaitlin [name] MRN: [id number] Date of Birth: 1946/01/17

## 2017-06-27 ENCOUNTER — Ambulatory Visit: Payer: Medicare Other | Admitting: Physical Therapy

## 2017-06-27 ENCOUNTER — Ambulatory Visit: Payer: Medicare Other | Admitting: Occupational Therapy

## 2017-06-27 ENCOUNTER — Encounter: Payer: Self-pay | Admitting: Physical Therapy

## 2017-06-27 DIAGNOSIS — R482 Apraxia: Secondary | ICD-10-CM

## 2017-06-27 DIAGNOSIS — I69318 Other symptoms and signs involving cognitive functions following cerebral infarction: Secondary | ICD-10-CM

## 2017-06-27 DIAGNOSIS — M6281 Muscle weakness (generalized): Secondary | ICD-10-CM | POA: Diagnosis not present

## 2017-06-27 DIAGNOSIS — R2681 Unsteadiness on feet: Secondary | ICD-10-CM

## 2017-06-27 DIAGNOSIS — R2689 Other abnormalities of gait and mobility: Secondary | ICD-10-CM

## 2017-06-27 DIAGNOSIS — I69353 Hemiplegia and hemiparesis following cerebral infarction affecting right non-dominant side: Secondary | ICD-10-CM

## 2017-06-27 NOTE — Therapy (Signed)
[facility] [address] [place], [place], [address] Phone: [phone]   Fax:  [phone]  Occupational Therapy Treatment  Patient Details  Name: Kaitlin [name] MRN: [id number] Date of Birth: 29-Jun-1946 Referring Provider: Dr. Letta Pate   Encounter Date: 06/27/2017  OT End of Session - 06/27/17 1209    Visit Number  15    Number of Visits  17    Date for OT Re-Evaluation  07/04/17    Authorization Type  UHC MCR - G code needed    Authorization - Visit Number  15    Authorization - Number of Visits  20    OT Start Time  0930    OT Stop Time  1015    OT Time Calculation (min)  45 min    Activity Tolerance  Patient tolerated treatment well       Past Medical History:  Diagnosis Date  . Abnormality of gait as late effect of stroke 02/23/2017  . Acute hypoxemic respiratory failure ([place])   . Atrial fibrillation with RVR ([place]) 02/17/2017  . CAD S/P percutaneous coronary angioplasty 2001  . CVA (cerebral vascular accident) ([place]) 01/2017  . Dysphagia as late effect of stroke 02/23/2017  . HCVD (hypertensive cardiovascular disease) 01/2017   normal LVF with moderate LVH, grade 1 DD  . High cholesterol   . Hypokalemia   . Labile blood pressure   . Leukocytosis   . Multiple sclerosis ([place])   . PAF (paroxysmal atrial fibrillation) ([place]) 01/2017  . Right hemiparesis [facility])     Past Surgical History:  Procedure Laterality Date  . CORONARY ANGIOPLASTY WITH STENT PLACEMENT  11/1999   [facility]CA DES[facility]  . IR ANGIO INTRA EXTRACRAN SEL COM CAROTID INNOMINATE UNI R MOD SED  02/15/2017  . IR ANGIO VERTEBRAL SEL SUBCLAVIAN INNOMINATE UNI R MOD SED  02/15/2017  . IR INTRAVSC STENT CERV CAROTID W/O EMB-PROT MOD SED INC ANGIO  02/15/2017  . IR PERCUTANEOUS ART THROMBECTOMY/INFUSION INTRACRANIAL INC DIAG ANGIO  02/15/2017  . IR RADIOLOGIST EVAL & MGMT  05/10/2017  . NECK SURGERY    . RADIOLOGY WITH ANESTHESIA N/A 02/14/2017   Procedure: RADIOLOGY WITH ANESTHESIA;  Surgeon: Radiologist, Medication, MD;  Location: [facility];  Service: Radiology;  Laterality: N/A;  . WRIST SURGERY      There were no vitals filed for this visit.  Subjective Assessment - 06/27/17 0930    Patient is accompained by:  Family member    Pertinent History  Recent mild SAH from fall 06/03/17, CVA 02/14/17 PMH: MS for approx 20 years (but no exacerbation for 10 years), A-fib, CAD w/ stent in 2000, white matter dz    Patient Stated Goals  to increase balance    Currently in Pain?  No/denies                   OT Treatments/Exercises (OP) - 06/27/17 0001      Hand Exercises   Other Hand Exercises  Gripper set at level 1 resistance to pick up blocks Rt hand for sustained grip strength, control, and increased attention/body awareness to Rt side. Pt with multiple drops partly d/t decreased sustained attention. losing proper hand placement, and partly d/t fatigue. Pt required multiple rest breaks. Pt also got blocks in order by specific colors for cognitive component.       Functional Reaching Activities   Mid Level  Standing: Pt placing large pegs in pegboard on vertical surface RUE in specific order by color  for cognitive component. Pt initially required min questioning cues to follow order of color, but then began looking independently after repetition    High Level Standing:  Pt retrieving and replacing cones from high shelf requiring 125 to 130* shoulder flexion RUE               OT Short Term Goals - 05/31/17 1325      OT SHORT TERM GOAL #1   Title  Pt/family independent with coordination and putty HEP - 06/04/2017    Time  4    Period  Weeks    Status  Achieved      OT SHORT TERM GOAL #2   Title  Pt/family independent with HEP for RUE shoulder ROM    Time  4    Period  Weeks    Status  Achieved      OT SHORT TERM GOAL #3   Title  Pt/family to verbalize understanding with safety with shower transfers and bathing  with DME/AE prn    Time  4    Period  Weeks    Status  Achieved      OT SHORT TERM GOAL #4   Title  Pt/family to verbalize understanding with memory strategies for sequencing with donning shirt/pants, sequencing and safety with use of walker/rollator, and for medication management    Time  4    Period  Weeks    Status  Achieved      OT SHORT TERM GOAL #5   Title  Pt to improve grip strength by 10 lbs Rt hand     Baseline  22 lbs    Time  4    Period  Weeks    Status  Achieved 05/31/2017 R= 35      OT SHORT TERM GOAL #6   Title  Pt to improve coordination Rt hand as evidenced by performing 9 hole peg test to 100 sec. or less    Baseline  2 min. 6 sec    Time  4    Period  Weeks    Status  Achieved 54.25 sec        OT Long Term Goals - 06/27/17 1210      OT LONG TERM GOAL #1   Title  Pt to retrieve light weight object from high shelf RUE requiring 120 degrees shoulder flexion w/o drops 5/5 trials -07/01/2017    Time  8    Period  Weeks    Status  Achieved      OT LONG TERM GOAL #2   Title  Pt to perform simple snack prep and light IADLS (washing dishes, laundry tasks) demo safety without walker with CGA and min cues for safety    Time  8    Period  Weeks    Status  Revised      OT LONG TERM GOAL #3   Title  Pt to implement memory strategies into ADLS including sequencing of donning clothes and safety with transfers with supervision and no more than min cues    Time  8    Period  Weeks    Status  Achieved      OT LONG TERM GOAL #4   Title  Pt to improve coordination Rt hand as evidenced by performing 9 hole peg test in 45 sec. or less    Baseline  2 min. 6 sec    Time  8    Period  Weeks    Status  Achieved  06/15/17: 37.38 sec      OT LONG TERM GOAL #5   Title  Pt to tie shoes and style hair mod I level with A/E prn    Time  8    Period  Weeks    Status  Partially Met Pt can tie shoes and wash hair, but unable to style hair w/o assist            Plan -  06/27/17 1211    Clinical Impression Statement  Pt overall has improved in coordination and functional use, but limitied by Rt neglect and cognitive deficits.     Rehab Potential  Fair    Current Impairments/barriers affecting progress:  severity of deficits including aphasia, cognition    OT Frequency  2x / week    OT Duration  8 weeks    OT Treatment/Interventions  Self-care/ADL training;DME and/or AE instruction;Patient/family education;Therapeutic exercises;Therapeutic activities;Functional Mobility Training;Passive range of motion;Cognitive remediation/compensation;Visual/perceptual remediation/compensation;Manual Therapy;Moist Heat    Plan  make sandwich, assess remaining LTG and d/c next session    Consulted and Agree with Plan of Care  Patient;Family member/caregiver    Family Member Consulted  husband-[name]       Patient will benefit from skilled therapeutic intervention in order to improve the following deficits and impairments:  Decreased coordination, Decreased range of motion, Impaired flexibility, Impaired sensation, Decreased safety awareness, Decreased knowledge of precautions, Impaired tone, Impaired UE functional use, Decreased knowledge of use of DME, Decreased mobility, Decreased balance, Decreased cognition, Decreased strength, Impaired perceived functional ability, Impaired vision/preception  Visit Diagnosis: Hemiplegia and hemiparesis following cerebral infarction affecting right non-dominant side (HCC)  Other symptoms and signs involving cognitive functions following cerebral infarction  Unsteadiness on feet  Apraxia    Problem List Patient Active Problem List   Diagnosis Date Noted  . Subarachnoid hemorrhage ([place]) 06/04/2017  . Multiple sclerosis ([place]) 05/12/2017  . CAD (coronary artery disease) 04/12/2017  . Hypertension 04/11/2017  . Right hemiparesis ([place])   . Aspiration pneumonia of right lung ([place])   . Leukocytosis   . Hypokalemia   . Labile blood  pressure   . Abnormality of gait as late effect of stroke 02/23/2017  . Dysarthria as late effect of stroke 02/23/2017  . Dysphagia as late effect of stroke 02/23/2017  . Acute ischemic left middle cerebral artery (MCA) stroke ([place]) 02/22/2017  . Paroxysmal A-fib ([place]) 02/17/2017  . Atrial fibrillation with RVR ([place]) 02/17/2017  . HLD (hyperlipidemia) 02/17/2017  . Acute hypoxemic respiratory failure ([place])   . Stroke (cerebrum) ([place]) - Acute MCA infarct due to left CCA/ICA and MCA occlusion, s/p mechanical thrombectomy and left ICA stenting in the setting of newly diagnosed afib 02/14/2017    Carey Bullocks, OTR/L 06/27/2017, 12:15 PM  [facility] [address] [place], [place], [address] Phone: [phone]   Fax:  [phone]  Name: Mauri Temkin MRN: [id number] Date of Birth: 1946-03-30

## 2017-06-27 NOTE — Patient Instructions (Signed)
    Forward Weight Shift    Stand with upright posture. Right foot in front with toes up and heel touching . Shift weight forward onto right leg until your foot is flat and knee straight. Shift weight back onto left leg.  __10_ reps per set, _2__ sets per day, __5_ days per week. Hold onto a support (chair or countertop). Repeat with other leg.  Copyright  VHI. All rights reserved.

## 2017-06-27 NOTE — Therapy (Signed)
[facility] [address] [place], [place], [address] Phone: [phone]   Fax:  [phone]  Physical Therapy Treatment  Patient Details  Name: Kaitlin [name] MRN: [id number] Date of Birth: [birth year] Referring Provider: Alysia Penna MD   Encounter Date: 06/27/2017  PT End of Session - 06/27/17 1952    Visit Number  13    Number of Visits  17    Date for PT Re-Evaluation  07/04/17    Authorization Type  U[employer] MCR    Authorization Time Period  05/05/17  to  07/04/17    PT Start Time  1101    PT Stop Time  1143    PT Time Calculation (min)  42 min    Equipment Utilized During Treatment  Gait belt    Activity Tolerance  Patient tolerated treatment well    Behavior During Therapy  [facility] for tasks assessed/performed       Past Medical History:  Diagnosis Date  . Abnormality of gait as late effect of stroke 02/23/2017  . Acute hypoxemic respiratory failure ([place])   . Atrial fibrillation with RVR ([place]) 02/17/2017  . CAD S/P percutaneous coronary angioplasty 2001  . CVA (cerebral vascular accident) ([place]) 01/2017  . Dysphagia as late effect of stroke 02/23/2017  . HCVD (hypertensive cardiovascular disease) 01/2017   normal LVF with moderate LVH, grade 1 DD  . High cholesterol   . Hypokalemia   . Labile blood pressure   . Leukocytosis   . Multiple sclerosis ([place])   . PAF (paroxysmal atrial fibrillation) ([place]) 01/2017  . Right hemiparesis [facility])     Past Surgical History:  Procedure Laterality Date  . CORONARY ANGIOPLASTY WITH STENT PLACEMENT  11/1999   RCA DES-[facility]  . IR ANGIO INTRA EXTRACRAN SEL COM CAROTID INNOMINATE UNI R MOD SED  02/15/2017  . IR ANGIO VERTEBRAL SEL SUBCLAVIAN INNOMINATE UNI R MOD SED  02/15/2017  . IR INTRAVSC STENT CERV CAROTID W/O EMB-PROT MOD SED INC ANGIO  02/15/2017  . IR PERCUTANEOUS ART THROMBECTOMY/INFUSION INTRACRANIAL INC DIAG ANGIO  02/15/2017  . IR RADIOLOGIST EVAL & MGMT  05/10/2017   . NECK SURGERY    . RADIOLOGY WITH ANESTHESIA N/A 02/14/2017   Procedure: RADIOLOGY WITH ANESTHESIA;  Surgeon: Radiologist, Medication, MD;  Location: [facility];  Service: Radiology;  Laterality: N/A;  . WRIST SURGERY      There were no vitals filed for this visit.  Subjective Assessment - 06/27/17 1106    Subjective  Husband reports that she continues to do her evening teeth brushing routine by herself with the walker. He feels this is most safe. Reports this is the same bathroom where pt last fell (with hospitalization for SDH), which at that time he reported was because of the RW.     Patient is accompained by:  Family member    Patient Stated Goals  "Standing up straight; getting my self-control" (balance)    Currently in Pain?  No/denies                      [facility] Adult PT Treatment/Exercise - 06/27/17 1941      Transfers   Sit to Stand  4: Min guard    Sit to Stand Details (indicate cue type and reason)  continued cues to not press backs of her legs/knees against the surface     Stand to Sit  4: Min guard;Without upper extremity assist    Stand to Sit Details  vc for slowing descent    Number of Reps  10 reps;1 set      Ambulation/Gait   Ambulation/Gait Assistance  4: Min guard;4: Min assist    Ambulation/Gait Assistance Details  assist for balance; vc for improved Rt foot clearance and step length    Ambulation Distance (Feet)  240 Feet x4    Assistive device  None    Gait Pattern  Step-through pattern;Decreased dorsiflexion - right;Poor foot clearance - right;Decreased stride length;Right foot flat;Left foot flat;Right flexed knee in stance;Left flexed knee in stance;Shuffle    Ambulation Surface  Indoor    Stairs  Yes    Stairs Assistance  4: Min assist    Stairs Assistance Details (indicate cue type and reason)  ascends forward with one rail and good safety; descends sideways (per husband) however with poor coordination of each foot placement when descending  sideways x 12 steps; husband then clarified that pt holds onto a rail on her right as she descends, (in clinic was using left rail per pt's report of home set-up)    Number of Stairs  4 x3    Height of Stairs  6    Pre-Gait Activities  anterior-posterior wt-shifting with emphasis on heelstrike to improve Rt foot clearance             PT Education - 06/27/17 1949    Education provided  Yes    Education Details  Reiterated PT opinion that patient does not need to be up standing or walking alone (husband should be with her at all times). Reiterated potential for life-threatening conditions if patient falls and strikes her head. Husband verbalizes understanding. Will plan to finalize HEP and discharge on next visit.     Person(s) Educated  Patient;Spouse    Methods  Explanation;Demonstration;Verbal cues;Handout    Comprehension  Verbalized understanding       PT Short Term Goals - 06/08/17 1418      PT SHORT TERM GOAL #1   Title  Patient (with supervision to min-guard assistance of her spouse) will perform basic HEP for balance and strengthening. (Target STGs 06/04/17)    Baseline  05/30/17: goals achieved    Time  4    Period  Weeks    Status  Achieved      PT SHORT TERM GOAL #2   Title  Patient will complete FGA with STG and LTG set as appropriate.    Baseline  10/10 FGA 9/30; 10/16 goal set    Time  1    Period  Weeks    Status  Achieved      PT SHORT TERM GOAL #3   Title  Patient will improve TUG with or without a device to <15 seconds to demonstrate a lesser fall risk.     Baseline  rollator 18.25; no device 17.81 sec. 05/30/17: No AD 18.10/ RW 26.59    Time  4    Period  Weeks    Status  Not Met      PT SHORT TERM GOAL #4   Title  Patient will improve her gait velocity to 2.62 ft/sec to indicate lesser fall risk with community ambulation.     Baseline  05/30/17: 2.04 ft/sec without AD 1.50f/sec with AD    Time  4    Period  Weeks    Status  Not Met      PT SHORT  TERM GOAL #5   Title  Patient will improve FGA score to >=14/30 to  demonstrate progress towards lesser fall risk    Baseline  05/31/17: Pt improved score to 16/30    Time  3    Period  Weeks    Status  Achieved        PT Long Term Goals - 06/08/17 1418      PT LONG TERM GOAL #1   Title  Patient and/or husband will be able to verbalize signs and symptoms of CVA and appropriate action to take. (Target LTGs: 07/04/17)    Time  8    Period  Weeks    Status  New      PT LONG TERM GOAL #2   Title  Patient (with supervision of husband) will perform updated HEP    Time  8    Period  Weeks    Status  New      PT LONG TERM GOAL #3   Title  Patient will ambulate >=2.79 ft/sec (age based norm) with LRAD or no device on level surface.    Time  8    Period  Weeks    Status  New      PT LONG TERM GOAL #4   Title  Patient will ambulate >= 1000 ft on outdoor surfaces (level and unlevel, grass, gravel, mulch) with LRAD and supervision.     Time  8    Period  Weeks    Status  New      PT LONG TERM GOAL #5   Title  Patient will perform TUG <13.5 seconds to demonstrate lesser fall risk.    Time  8    Period  Weeks    Status  New      PT LONG TERM GOAL #6   Title  Patient will improve FGA score by 4 points above her score assessed at STGs (TBA).    Baseline  10/16 goal set per STG set on eval    Time  7    Period  Weeks    Status  New            Plan - 06/27/17 1957    Clinical Impression Statement  continued spouse education re: safety issues with patient walking with RW (2 trips/falls) and need to be right beside her when up walking with no device. Pre-gait and gait training to improve Rt foot clearance to decrease risk of tripping over rt foot. Husband able to recall cuing pt to lead with heelstrike on RLE.Marland Kitchen Discussed plan for discharge from PT after next session and finalization of HEP.     Rehab Potential  Good    Clinical Impairments Affecting Rehab Potential  impulsivity  and decr awareness of deficits    PT Frequency  2x / week    PT Duration  8 weeks    PT Treatment/Interventions  ADLs/Self Care Home Management;DME Instruction;Gait training;Stair training;Functional mobility training;Orthotic Fit/Training;Patient/family education;Cognitive remediation;Neuromuscular re-education;Balance training;Therapeutic exercise;Therapeutic activities    PT Next Visit Plan  check LTGs and d/c    Consulted and Agree with Plan of Care  Patient;Family member/caregiver    Family Member Consulted  spouse, [name]       Patient will benefit from skilled therapeutic intervention in order to improve the following deficits and impairments:  Abnormal gait, Decreased balance, Decreased cognition, Decreased knowledge of use of DME, Decreased mobility, Decreased safety awareness, Decreased strength, Difficulty walking, Impaired perceived functional ability, Impaired sensation, Impaired UE functional use  Visit Diagnosis: Unsteadiness on feet  Other abnormalities of gait and mobility  Problem List Patient Active Problem List   Diagnosis Date Noted  . Subarachnoid hemorrhage ([place]) 06/04/2017  . Multiple sclerosis ([place]) 05/12/2017  . CAD (coronary artery disease) 04/12/2017  . Hypertension 04/11/2017  . Right hemiparesis ([place])   . Aspiration pneumonia of right lung ([place])   . Leukocytosis   . Hypokalemia   . Labile blood pressure   . Abnormality of gait as late effect of stroke 02/23/2017  . Dysarthria as late effect of stroke 02/23/2017  . Dysphagia as late effect of stroke 02/23/2017  . Acute ischemic left middle cerebral artery (MCA) stroke ([place]) 02/22/2017  . Paroxysmal A-fib ([place]) 02/17/2017  . Atrial fibrillation with RVR ([place]) 02/17/2017  . HLD (hyperlipidemia) 02/17/2017  . Acute hypoxemic respiratory failure ([place])   . Stroke (cerebrum) ([place]) - Acute MCA infarct due to left CCA/ICA and MCA occlusion, s/p mechanical thrombectomy and left ICA stenting in the setting  of newly diagnosed afib 02/14/2017    Rexanne Mano, PT 06/27/2017, 8:05 PM  [facility] [address] [place], [place], [address] Phone: [phone]   Fax:  [phone]  Name: Kaitlin [name] MRN: [id number] Date of Birth: 04/22/1946

## 2017-06-28 ENCOUNTER — Ambulatory Visit (HOSPITAL_COMMUNITY)
Admission: RE | Admit: 2017-06-28 | Discharge: 2017-06-28 | Disposition: A | Discharge status: Home / Self Care | Payer: Medicare Other | Source: Ambulatory Visit | Attending: Interventional Radiology | Admitting: Interventional Radiology

## 2017-06-28 DIAGNOSIS — I639 Cerebral infarction, unspecified: Secondary | ICD-10-CM | POA: Diagnosis not present

## 2017-06-28 DIAGNOSIS — Z9889 Other specified postprocedural states: Secondary | ICD-10-CM | POA: Insufficient documentation

## 2017-06-28 NOTE — Progress Notes (Signed)
Bilateral carotid completed. No evidence of significant ICA stenosis. Patent stent. Vertebral artery flow is antegrade.  [place] [employer] Shacara [name], RVS 06/28/2017 11:26 am

## 2017-06-30 ENCOUNTER — Ambulatory Visit: Payer: Medicare Other | Admitting: Physical Therapy

## 2017-06-30 ENCOUNTER — Ambulatory Visit: Payer: Medicare Other | Admitting: Occupational Therapy

## 2017-06-30 ENCOUNTER — Ambulatory Visit: Payer: Medicare Other

## 2017-06-30 ENCOUNTER — Encounter: Payer: Self-pay | Admitting: Physical Therapy

## 2017-06-30 DIAGNOSIS — R4701 Aphasia: Secondary | ICD-10-CM

## 2017-06-30 DIAGNOSIS — R482 Apraxia: Secondary | ICD-10-CM

## 2017-06-30 DIAGNOSIS — I69353 Hemiplegia and hemiparesis following cerebral infarction affecting right non-dominant side: Secondary | ICD-10-CM

## 2017-06-30 DIAGNOSIS — M6281 Muscle weakness (generalized): Secondary | ICD-10-CM | POA: Diagnosis not present

## 2017-06-30 DIAGNOSIS — R2681 Unsteadiness on feet: Secondary | ICD-10-CM

## 2017-06-30 DIAGNOSIS — R41841 Cognitive communication deficit: Secondary | ICD-10-CM

## 2017-06-30 DIAGNOSIS — R2689 Other abnormalities of gait and mobility: Secondary | ICD-10-CM

## 2017-06-30 DIAGNOSIS — I69318 Other symptoms and signs involving cognitive functions following cerebral infarction: Secondary | ICD-10-CM

## 2017-06-30 NOTE — Therapy (Signed)
[facility] [address] [place], [place], [address] Phone: [phone]   Fax:  [phone]  Physical Therapy Treatment and Discharge Summary  Patient Details  Name: Kaitlin [name] MRN: [id number] Date of Birth: 02-22-1946 Referring Provider: Alysia Penna MD   Encounter Date: 06/30/2017  PT End of Session - 06/30/17 1511    Visit Number  14    Number of Visits  17    Date for PT Re-Evaluation  07/04/17    Authorization Type  UHC MCR    Authorization Time Period  05/05/17  to  07/04/17    PT Start Time  1315    PT Stop Time  1358    PT Time Calculation (min)  43 min    Equipment Utilized During Treatment  Gait belt    Activity Tolerance  Patient tolerated treatment well    Behavior During Therapy  [facility] for tasks assessed/performed       Past Medical History:  Diagnosis Date  . Abnormality of gait as late effect of stroke 02/23/2017  . Acute hypoxemic respiratory failure ([place])   . Atrial fibrillation with RVR ([place]) 02/17/2017  . CAD S/P percutaneous coronary angioplasty 2001  . CVA (cerebral vascular accident) ([place]) 01/2017  . Dysphagia as late effect of stroke 02/23/2017  . HCVD (hypertensive cardiovascular disease) 01/2017   normal LVF with moderate LVH, grade 1 DD  . High cholesterol   . Hypokalemia   . Labile blood pressure   . Leukocytosis   . Multiple sclerosis ([place])   . PAF (paroxysmal atrial fibrillation) ([place]) 01/2017  . Right hemiparesis [facility])     Past Surgical History:  Procedure Laterality Date  . CORONARY ANGIOPLASTY WITH STENT PLACEMENT  11/1999   RCA DES-[facility]  . IR ANGIO INTRA EXTRACRAN SEL COM CAROTID INNOMINATE UNI R MOD SED  02/15/2017  . IR ANGIO VERTEBRAL SEL SUBCLAVIAN INNOMINATE UNI R MOD SED  02/15/2017  . IR INTRAVSC STENT CERV CAROTID W/O EMB-PROT MOD SED INC ANGIO  02/15/2017  . IR PERCUTANEOUS ART THROMBECTOMY/INFUSION INTRACRANIAL INC DIAG ANGIO  02/15/2017  . IR RADIOLOGIST  EVAL & MGMT  05/10/2017  . NECK SURGERY    . RADIOLOGY WITH ANESTHESIA N/A 02/14/2017   Procedure: RADIOLOGY WITH ANESTHESIA;  Surgeon: Radiologist, Medication, MD;  Location: [facility];  Service: Radiology;  Laterality: N/A;  . WRIST SURGERY      There were no vitals filed for this visit.  Subjective Assessment - 06/30/17 1316    Subjective  Nothing new. Thankful for how much she has improved with therapy    Patient is accompained by:  Family member    Patient Stated Goals  "Standing up straight; getting my self-control" (balance)    Currently in Pain?  No/denies         [facility] PT Assessment - 06/30/17 0001      Ambulation/Gait   Ambulation/Gait Assistance Details  closeguarding due to continued poor Rt foot clearance (both in her "dress flats" and with AFO and sneakers (dragging her foot as much as 50% of steps when she walks >200 ft)      Timed Up and Go Test   Normal TUG (seconds)  14.82 sneakers & AFO; 14.78 sec dress flats      Functional Gait  Assessment   Gait Level Surface  Walks 20 ft, slow speed, abnormal gait pattern, evidence for imbalance or deviates 10-15 in outside of the 12 in walkway width. Requires more than 7 sec to  ambulate 20 ft.    Change in Gait Speed  Able to change speed, demonstrates mild gait deviations, deviates 6-10 in outside of the 12 in walkway width, or no gait deviations, unable to achieve a major change in velocity, or uses a change in velocity, or uses an assistive device.    Gait with Horizontal Head Turns  Performs head turns smoothly with slight change in gait velocity (eg, minor disruption to smooth gait path), deviates 6-10 in outside 12 in walkway width, or uses an assistive device.    Gait with Vertical Head Turns  Performs task with slight change in gait velocity (eg, minor disruption to smooth gait path), deviates 6 - 10 in outside 12 in walkway width or uses assistive device    Gait and Pivot Turn  Pivot turns safely within 3 sec and stops  quickly with no loss of balance.    Step Over Obstacle  Is able to step over one shoe box (4.5 in total height) without changing gait speed. No evidence of imbalance.    Gait with Narrow Base of Support  Ambulates 7-9 steps.    Gait with Eyes Closed  Walks 20 ft, uses assistive device, slower speed, mild gait deviations, deviates 6-10 in outside 12 in walkway width. Ambulates 20 ft in less than 9 sec but greater than 7 sec.    Ambulating Backwards  Walks 20 ft, slow speed, abnormal gait pattern, evidence for imbalance, deviates 10-15 in outside 12 in walkway width.    Steps  Two feet to a stair, must use rail.    Total Score  18                  [facility] Adult PT Treatment/Exercise - 06/30/17 0001      Transfers   Sit to Stand  5: Supervision    Stand to Sit  4: Min guard    Stand to Sit Details  cues for safe descent (twice was turning while descending and "flopped" onto chair      Ambulation/Gait   Ambulation/Gait Assistance  4: Min guard    Ambulation Distance (Feet)  240 Feet with AFO and sneakers; 120 with 'dress flats"    Assistive device  None    Gait Pattern  Step-through pattern;Decreased dorsiflexion - right;Poor foot clearance - right;Decreased stride length;Right foot flat;Left foot flat;Right flexed knee in stance;Left flexed knee in stance;Shuffle    Ambulation Surface  Indoor    Gait velocity  32.8/14.25=2.30 ft/sec (sneakers, AFO); 2.24 ft/sec dress flats    Stairs  Yes    Stairs Assistance  4: Min assist    Stairs Assistance Details (indicate cue type and reason)  ascends with LUE on lt rail; descends with both hands on rail    Stair Management Technique  One rail Left;Step to pattern;Forwards    Number of Stairs  4 x 2    Height of Stairs  6    Gait Comments  outdoor ambulation not assessed due to weather             PT Education - 06/30/17 1510    Education provided  Yes    Education Details  results of LTG assessment; need for continued 24/7  minguard to supervision due to safety issues (Rt foot drop and cognitive)    Person(s) Educated  Patient;Spouse    Methods  Explanation    Comprehension  Verbalized understanding       PT Short Term Goals - 06/08/17 1418  PT SHORT TERM GOAL #1   Title  Patient (with supervision to min-guard assistance of her spouse) will perform basic HEP for balance and strengthening. (Target STGs 06/04/17)    Baseline  05/30/17: goals achieved    Time  4    Period  Weeks    Status  Achieved      PT SHORT TERM GOAL #2   Title  Patient will complete FGA with STG and LTG set as appropriate.    Baseline  10/10 FGA 9/30; 10/16 goal set    Time  1    Period  Weeks    Status  Achieved      PT SHORT TERM GOAL #3   Title  Patient will improve TUG with or without a device to <15 seconds to demonstrate a lesser fall risk.     Baseline  rollator 18.25; no device 17.81 sec. 05/30/17: No AD 18.10/ RW 26.59    Time  4    Period  Weeks    Status  Not Met      PT SHORT TERM GOAL #4   Title  Patient will improve her gait velocity to 2.62 ft/sec to indicate lesser fall risk with community ambulation.     Baseline  05/30/17: 2.04 ft/sec without AD 1.37f/sec with AD    Time  4    Period  Weeks    Status  Not Met      PT SHORT TERM GOAL #5   Title  Patient will improve FGA score to >=14/30 to demonstrate progress towards lesser fall risk    Baseline  05/31/17: Pt improved score to 16/30    Time  3    Period  Weeks    Status  Achieved        PT Long Term Goals - 06/30/17 1512      PT LONG TERM GOAL #1   Title  Patient and/or husband will be able to verbalize signs and symptoms of CVA and appropriate action to take. (Target LTGs: 07/04/17)    Baseline  11/29 lack of time/therapist forgot to assess    Time  8    Period  Weeks    Status  Unable to assess      PT LONG TERM GOAL #2   Title  Patient (with supervision of husband) will perform updated HEP    Time  8    Period  Weeks    Status   Achieved      PT LONG TERM GOAL #3   Title  Patient will ambulate >=2.79 ft/sec (age based norm) with LRAD or no device on level surface.    Baseline  11/29  2.30 ft/sec with no device (except AFO)    Time  8    Period  Weeks    Status  Partially Met      PT LONG TERM GOAL #4   Title  Patient will ambulate >= 1000 ft on outdoor surfaces (level and unlevel, grass, gravel, mulch) with LRAD and supervision.     Baseline  11/29 unable to assess due to weather    Time  8    Period  Weeks    Status  Unable to assess      PT LONG TERM GOAL #5   Title  Patient will perform TUG <13.5 seconds to demonstrate lesser fall risk.    Baseline  11/29  14.82 sec (sneakers and AFO)    Time  8    Period  Weeks  Status  Partially Met      PT LONG TERM GOAL #6   Title  Patient will improve FGA score by 4 points above her score assessed at STGs (TBA).    Baseline  10/16 goal set per STG set on eval (scored 9/30); July 21, 2017 18/30    Time  7    Period  Weeks    Status  Achieved            Plan - 2017-07-21 1520    Clinical Impression Statement  Final session with LTGs assessed: pt met 2 of 6 LTGs, partially met 2 of 6 goals (progressed but not to goal level); unable to assess 2 LTGs. Overall, pt did make improvements slowly with a set-back due to a fall with SDH requiring hospitalization during the course of her therapy. Decreased awareness of her deficits, visual impairment, decreased safety awareness, and overall memory deficits made it difficult for pt to carryover instructions. Her husband did not consistently follow PT's recommendations (re: AFO use and close supervision when up walking). Patient and husband feel they are ready for discharge from PT. Overall, patient has maximized her functional mobility and is being discharged from PT.     Rehab Potential  Good    Clinical Impairments Affecting Rehab Potential  impulsivity and decr awareness of deficits    PT Treatment/Interventions  ADLs/Self  Care Home Management;DME Instruction;Gait training;Stair training;Functional mobility training;Orthotic Fit/Training;Patient/family education;Cognitive remediation;Neuromuscular re-education;Balance training;Therapeutic exercise;Therapeutic activities    Consulted and Agree with Plan of Care  Patient;Family member/caregiver    Family Member Consulted  spouse, [name]       Patient will benefit from skilled therapeutic intervention in order to improve the following deficits and impairments:  Abnormal gait, Decreased balance, Decreased cognition, Decreased knowledge of use of DME, Decreased mobility, Decreased safety awareness, Decreased strength, Difficulty walking, Impaired perceived functional ability, Impaired sensation, Impaired UE functional use  Visit Diagnosis: Unsteadiness on feet  Other abnormalities of gait and mobility   G-Codes - 07/21/2017 1528    Functional Assessment Tool Used (Outpatient Only)  TUG 14.82 sec no device; gait velocity 2.30 ft/sec    Functional Limitation  Mobility: Walking and moving around    Mobility: Walking and Moving Around Goal Status [phone])  At least 1 percent but less than 20 percent impaired, limited or restricted    Mobility: Walking and Moving Around Discharge Status [phone])  At least 20 percent but less than 40 percent impaired, limited or restricted       Problem List Patient Active Problem List   Diagnosis Date Noted  . Subarachnoid hemorrhage ([place]) 06/04/2017  . Multiple sclerosis ([place]) 05/12/2017  . CAD (coronary artery disease) 04/12/2017  . Hypertension 04/11/2017  . Right hemiparesis ([place])   . Aspiration pneumonia of right lung ([place])   . Leukocytosis   . Hypokalemia   . Labile blood pressure   . Abnormality of gait as late effect of stroke 02/23/2017  . Dysarthria as late effect of stroke 02/23/2017  . Dysphagia as late effect of stroke 02/23/2017  . Acute ischemic left middle cerebral artery (MCA) stroke ([place]) 02/22/2017  . Paroxysmal  A-fib ([place]) 02/17/2017  . Atrial fibrillation with RVR ([place]) 02/17/2017  . HLD (hyperlipidemia) 02/17/2017  . Acute hypoxemic respiratory failure ([place])   . Stroke (cerebrum) ([facility]) - Acute MCA infarct due to left CCA/ICA and MCA occlusion, s/p mechanical thrombectomy and left ICA stenting in the setting of newly diagnosed afib 02/14/2017   PHYSICAL THERAPY DISCHARGE SUMMARY  Visits from Start of Care: 14   Current functional level related to goals / functional outcomes: [place] assist with ambulation (with and without a RW; with and without Rt AFO)   Remaining deficits: Impaired balance due to RLE weakness and tendency to drag foot (even with AFO); cognitive deficits with decr safety awareness   Education / Equipment: HEP; AFO provided prior to coming to OPPT  Plan: Patient agrees to discharge.  Patient goals were partially met. Patient is being discharged due to being pleased with the current functional level.  ?????       Rexanne Mano, PT 06/30/2017, 5:30 PM  [facility] [address] [place], [place], [address] Phone: [phone]   Fax:  [phone]  Name: Kaitlin [name] MRN: [id number] Date of Birth: 1945/12/13

## 2017-06-30 NOTE — Therapy (Signed)
[facility] [address] [place], [place], [address] Phone: [phone]   Fax:  [phone]  Occupational Therapy Treatment  Patient Details  Name: Kaitlin [name] MRN: [id number] Date of Birth: 03-Oct-1945 Referring Provider: Dr. Letta Pate   Encounter Date: 06/30/2017  OT End of Session - 06/30/17 1524    Visit Number  16    Number of Visits  17    Date for OT Re-Evaluation  07/04/17    Authorization Type  [employer] MCR - G code needed    Authorization - Visit Number  16    Authorization - Number of Visits  20    OT Start Time  1450    OT Stop Time  1520    OT Time Calculation (min)  30 min    Activity Tolerance  Patient tolerated treatment well       Past Medical History:  Diagnosis Date  . Abnormality of gait as late effect of stroke 02/23/2017  . Acute hypoxemic respiratory failure ([place])   . Atrial fibrillation with RVR ([place]) 02/17/2017  . CAD S/P percutaneous coronary angioplasty 2001  . CVA (cerebral vascular accident) ([facility]) 01/2017  . Dysphagia as late effect of stroke 02/23/2017  . HCVD (hypertensive cardiovascular disease) 01/2017   normal LVF with moderate LVH, grade 1 DD  . High cholesterol   . Hypokalemia   . Labile blood pressure   . Leukocytosis   . Multiple sclerosis ([place])   . PAF (paroxysmal atrial fibrillation) ([place]) 01/2017  . Right hemiparesis [facility])     Past Surgical History:  Procedure Laterality Date  . CORONARY ANGIOPLASTY WITH STENT PLACEMENT  11/1999   RCA [facility]  . IR ANGIO INTRA EXTRACRAN SEL COM CAROTID INNOMINATE UNI R MOD SED  02/15/2017  . IR ANGIO VERTEBRAL SEL SUBCLAVIAN INNOMINATE UNI R MOD SED  02/15/2017  . IR INTRAVSC STENT CERV CAROTID W/O EMB-PROT MOD SED INC ANGIO  02/15/2017  . IR PERCUTANEOUS ART THROMBECTOMY/INFUSION INTRACRANIAL INC DIAG ANGIO  02/15/2017  . IR RADIOLOGIST EVAL & MGMT  05/10/2017  . NECK SURGERY    . RADIOLOGY WITH ANESTHESIA N/A 02/14/2017    Procedure: RADIOLOGY WITH ANESTHESIA;  Surgeon: Radiologist, Medication, MD;  Location: [facility];  Service: Radiology;  Laterality: N/A;  . WRIST SURGERY      There were no vitals filed for this visit.  Subjective Assessment - 06/30/17 1453    Subjective   "Yes" (re: ready to d/c)    Patient is accompained by:  Family member    Pertinent History  Recent mild SAH from fall 06/03/17, CVA 02/14/17 PMH: MS for approx 20 years (but no exacerbation for 10 years), A-fib, CAD w/ stent in 2000, white matter dz    Patient Stated Goals  to increase balance    Currently in Pain?  No/denies         [facility] OT Assessment - 06/30/17 0001      Hand Function   Right Hand Grip (lbs)  27 lbs               OT Treatments/Exercises (OP) - 06/30/17 1521      ADLs   Cooking  Pt made PB sandwich with min cueing to gather items in unfamiliar kitchen and close sup d/t fall risk. Pt however performed task at mod I level.     ADL Comments  Assessed remaining goal and progress to date      Functional Reaching Activities   High Level  Pt placing graded clothespins (red to blue resistance) on antenna for functional reaching and pinch strength RUE.                OT Short Term Goals - 05/31/17 1325      OT SHORT TERM GOAL #1   Title  Pt/family independent with coordination and putty HEP - 06/04/2017    Time  4    Period  Weeks    Status  Achieved      OT SHORT TERM GOAL #2   Title  Pt/family independent with HEP for RUE shoulder ROM    Time  4    Period  Weeks    Status  Achieved      OT SHORT TERM GOAL #3   Title  Pt/family to verbalize understanding with safety with shower transfers and bathing with DME/AE prn    Time  4    Period  Weeks    Status  Achieved      OT SHORT TERM GOAL #4   Title  Pt/family to verbalize understanding with memory strategies for sequencing with donning shirt/pants, sequencing and safety with use of walker/rollator, and for medication management    Time  4     Period  Weeks    Status  Achieved      OT SHORT TERM GOAL #5   Title  Pt to improve grip strength by 10 lbs Rt hand     Baseline  22 lbs    Time  4    Period  Weeks    Status  Achieved 05/31/2017 R= 35      OT SHORT TERM GOAL #6   Title  Pt to improve coordination Rt hand as evidenced by performing 9 hole peg test to 100 sec. or less    Baseline  2 min. 6 sec    Time  4    Period  Weeks    Status  Achieved 54.25 sec        OT Long Term Goals - 06/30/17 1525      OT LONG TERM GOAL #1   Title  Pt to retrieve light weight object from high shelf RUE requiring 120 degrees shoulder flexion w/o drops 5/5 trials -07/01/2017    Time  8    Period  Weeks    Status  Achieved      OT LONG TERM GOAL #2   Title  Pt to perform simple snack prep and light IADLS (washing dishes, laundry tasks) demo safety without walker with CGA and min cues for safety    Time  8    Period  Weeks    Status  Achieved      OT LONG TERM GOAL #3   Title  Pt to implement memory strategies into ADLS including sequencing of donning clothes and safety with transfers with supervision and no more than min cues    Time  8    Period  Weeks    Status  Achieved      OT LONG TERM GOAL #4   Title  Pt to improve coordination Rt hand as evidenced by performing 9 hole peg test in 45 sec. or less    Baseline  2 min. 6 sec    Time  8    Period  Weeks    Status  Achieved 06/15/17: 37.38 sec      OT LONG TERM GOAL #5   Title  Pt to tie shoes and style hair mod  I level with A/E prn    Time  8    Period  Weeks    Status  Partially Met Pt can tie shoes and wash hair, but unable to style hair w/o assist            Plan - 2017-07-24 1525    Clinical Impression Statement  Pt has met revised LTG's. Pt is functionally performing at maximize rehab potential at this time considering cognitive deficits and Rt neglect    OT Treatment/Interventions  Self-care/ADL training;DME and/or AE instruction;Patient/family  education;Therapeutic exercises;Therapeutic activities;Functional Mobility Training;Passive range of motion;Cognitive remediation/compensation;Visual/perceptual remediation/compensation;Manual Therapy;Moist Heat    Plan  D/C O.Donnajean Lopes and Agree with Plan of Care  Patient;Family member/caregiver    Family Member Consulted  husband-[name]       Patient will benefit from skilled therapeutic intervention in order to improve the following deficits and impairments:  Decreased coordination, Decreased range of motion, Impaired flexibility, Impaired sensation, Decreased safety awareness, Decreased knowledge of precautions, Impaired tone, Impaired UE functional use, Decreased knowledge of use of DME, Decreased mobility, Decreased balance, Decreased cognition, Decreased strength, Impaired perceived functional ability, Impaired vision/preception  Visit Diagnosis: Hemiplegia and hemiparesis following cerebral infarction affecting right non-dominant side (HCC)  Other symptoms and signs involving cognitive functions following cerebral infarction  Unsteadiness on feet  G-Codes - 24-Jul-2017 1527    Functional Assessment Tool Used (Outpatient only)  RUE function based on 9 hole peg test, grip strength, inattention to Rt side    Functional Limitation  Carrying, moving and handling objects    Carrying, Moving and Handling Objects Goal Status ([id number])  At least 20 percent but less than 40 percent impaired, limited or restricted    Carrying, Moving and Handling Objects Discharge Status [phone])  At least 20 percent but less than 40 percent impaired, limited or restricted       Problem List Patient Active Problem List   Diagnosis Date Noted  . Subarachnoid hemorrhage ([place]) 06/04/2017  . Multiple sclerosis ([place]) 05/12/2017  . CAD (coronary artery disease) 04/12/2017  . Hypertension 04/11/2017  . Right hemiparesis ([place])   . Aspiration pneumonia of right lung ([place])   . Leukocytosis   . Hypokalemia   .  Labile blood pressure   . Abnormality of gait as late effect of stroke 02/23/2017  . Dysarthria as late effect of stroke 02/23/2017  . Dysphagia as late effect of stroke 02/23/2017  . Acute ischemic left middle cerebral artery (MCA) stroke ([place]) 02/22/2017  . Paroxysmal A-fib ([place]) 02/17/2017  . Atrial fibrillation with RVR ([place]) 02/17/2017  . HLD (hyperlipidemia) 02/17/2017  . Acute hypoxemic respiratory failure ([place])   . Stroke (cerebrum) (HCC) - Acute MCA infarct due to left CCA/ICA and MCA occlusion, s/p mechanical thrombectomy and left ICA stenting in the setting of newly diagnosed afib 02/14/2017    OCCUPATIONAL THERAPY DISCHARGE SUMMARY  Visits from Start of Care: 16   Current functional level related to goals / functional outcomes: SEE ABOVE   Remaining deficits: Cognition Balance Rt inattention   Education / Equipment: Pt/husband provided with HEP's, safety considerations and fall prevention techniques  Plan: Patient agrees to discharge.  Patient goals were met. Patient is being discharged due to meeting the stated rehab goals.  And reaching maximal rehab potential at this time.?????         Carey Bullocks, OTR/L 24-Jul-2017, 3:28 PM  [facility] [address] [place], [place], [address]  Phone: [phone]   Fax:  [phone]  Name: Kaitlin [name] MRN: [id number] Date of Birth: 05-04-1946

## 2017-06-30 NOTE — Therapy (Signed)
[facility] [address] [place], [place], [address] Phone: [phone]   Fax:  [phone]  Speech Language Pathology Treatment  Patient Details  Name: Kaitlin [name] MRN: [id number] Date of Birth: 09-28-1945 Referring Provider: Alysia Penna, MD   Encounter Date: 06/30/2017  End of Session - 06/30/17 1716    Visit Number  14    Number of Visits  17    Date for SLP Re-Evaluation  07/11/17    SLP Start Time  0600    SLP Stop Time   1445    SLP Time Calculation (min)  42 min    Activity Tolerance  Patient tolerated treatment well       Past Medical History:  Diagnosis Date  . Abnormality of gait as late effect of stroke 02/23/2017  . Acute hypoxemic respiratory failure ([place])   . Atrial fibrillation with RVR ([place]) 02/17/2017  . CAD S/P percutaneous coronary angioplasty 2001  . CVA (cerebral vascular accident) ([place]) 01/2017  . Dysphagia as late effect of stroke 02/23/2017  . HCVD (hypertensive cardiovascular disease) 01/2017   normal LVF with moderate LVH, grade 1 DD  . High cholesterol   . Hypokalemia   . Labile blood pressure   . Leukocytosis   . Multiple sclerosis ([place])   . PAF (paroxysmal atrial fibrillation) ([place]) 01/2017  . Right hemiparesis [facility])     Past Surgical History:  Procedure Laterality Date  . CORONARY ANGIOPLASTY WITH STENT PLACEMENT  11/1999   [facility]  . IR ANGIO INTRA EXTRACRAN SEL COM CAROTID INNOMINATE UNI R MOD SED  02/15/2017  . IR ANGIO VERTEBRAL SEL SUBCLAVIAN INNOMINATE UNI R MOD SED  02/15/2017  . IR INTRAVSC STENT CERV CAROTID W/O EMB-PROT MOD SED INC ANGIO  02/15/2017  . IR PERCUTANEOUS ART THROMBECTOMY/INFUSION INTRACRANIAL INC DIAG ANGIO  02/15/2017  . IR RADIOLOGIST EVAL & MGMT  05/10/2017  . NECK SURGERY    . RADIOLOGY WITH ANESTHESIA N/A 02/14/2017   Procedure: RADIOLOGY WITH ANESTHESIA;  Surgeon: Radiologist, Medication, MD;  Location: [facility];  Service: Radiology;   Laterality: N/A;  . WRIST SURGERY      There were no vitals filed for this visit.  Subjective Assessment - 06/30/17 1412    Subjective  Husband reports pt did well with letter-fill-ins. "You better get out the notebook and the - - pencil in case you need - to write - something down."            ADULT SLP TREATMENT - 06/30/17 1411      General Information   Behavior/Cognition  Alert;Cooperative;Pleasant mood;Requires cueing      Treatment Provided   Treatment provided  Cognitive-Linquistic      Cognitive-Linquistic Treatment   Treatment focused on  Aphasia    Skilled Treatment  SLP engaged pt's husband and pt in re-education about compensatory strategies and used pt's "S" for examples of how to do this, for approx 10-15 minutes discussion. SLP learned pt is "a perfectionist" and that is one of her main troubles with using compensations. Additionally pt husband told SLP that when pt/husband are under time constraints especially in the mornings pt language is worse - more word finding difficulties and word finding errors. SLP assisted pt/husband in practical ways to alleviate/lessen that from occurring. Pt description of objects with usual vague speech requiring SLP verbal/semantic cues and spelling cues to assist pt with word finding in sentence formulation.       Assessment / Recommendations / Plan  Plan  Continue with current plan of care      Progression Toward Goals   Progression toward goals  Progressing toward goals       SLP Education - 06/30/17 1714    Education provided  Yes    Education Details  compensations for decr'd word finding, how to minimize anomic/dysnomic episodes when time constraints occur in the AMs    Person(s) Educated  Patient;Spouse    Methods  Explanation    Comprehension  Verbalized understanding       SLP Short Term Goals - 06/21/17 1527      SLP SHORT TERM GOAL #1   Title  pt will generate 8 items in a simple category with compensations  allowed    Status  Not Met      SLP SHORT TERM GOAL #2   Title  pt will provide sentence level responses using language compensations 80% success    Status  Not Met      SLP SHORT TERM GOAL #3   Title  pt will demo understanding of 5 minutes mod complex conversation, by providing appropriate responses in conversation 95% over three sessions    Status  Not Met      SLP SHORT TERM GOAL #4   Title  pt will demo appropriate breath support 95% for improvement in voice volume at word level    Status  Deferred       SLP Long Term Goals - 06/30/17 1718      SLP LONG TERM GOAL #1   Title  pt will demo appropriate breath support for adequate speech volume in 10 minutes simple conversation     Status  Deferred volume WFL/WNL consistently      SLP LONG TERM GOAL #2   Title  pt will demo understanding of 8 minutes min-mod complex conversation with request for repeats PRN    Status  Achieved      SLP LONG TERM GOAL #3   Title  Pt will complete mod complex naming tasks with occasional min A    Time  2    Period  Weeks    Status  On-going      SLP LONG TERM GOAL #4   Title  pt will produce 7 minutes simple conversation using compensations for anomia over two sessions    Time  2    Period  Weeks    Status  On-going       Plan - 06/30/17 1716    Clinical Impression Statement  Pt presents today with cont'd expressive aphasia, and receptive aphasia (expressive worse than receptive). See "skilled intervention" for more details. Pt will cont to receive skilled ST for approx 2-3 more visits to primarily address aphasia. Volume today was also WFL-WNL for all speech tasks, becoming quieter when pt had more difficulty with spoken language expression.     Speech Therapy Frequency  2x / week    Treatment/Interventions  Language facilitation;Internal/external aids;Compensatory techniques;SLP instruction and feedback;Multimodal communcation approach;Cognitive reorganization;Functional tasks;Cueing  hierarchy;Compensatory strategies;Patient/family education    Potential to Achieve Goals  Fair    Potential Considerations  Co-morbidities;Severity of impairments;Ability to learn/carryover information;Previous level of function    Consulted and Agree with Plan of Care  Patient;Family member/caregiver    Family Member Consulted  husband, [name]       Patient will benefit from skilled therapeutic intervention in order to improve the following deficits and impairments:   Aphasia  Apraxia  Cognitive communication deficit  Problem List Patient Active Problem List   Diagnosis Date Noted  . Subarachnoid hemorrhage ([place]) 06/04/2017  . Multiple sclerosis ([place]) 05/12/2017  . CAD (coronary artery disease) 04/12/2017  . Hypertension 04/11/2017  . Right hemiparesis ([place])   . Aspiration pneumonia of right lung ([place])   . Leukocytosis   . Hypokalemia   . Labile blood pressure   . Abnormality of gait as late effect of stroke 02/23/2017  . Dysarthria as late effect of stroke 02/23/2017  . Dysphagia as late effect of stroke 02/23/2017  . Acute ischemic left middle cerebral artery (MCA) stroke ([place]) 02/22/2017  . Paroxysmal A-fib ([place]) 02/17/2017  . Atrial fibrillation with RVR ([place]) 02/17/2017  . HLD (hyperlipidemia) 02/17/2017  . Acute hypoxemic respiratory failure ([place])   . Stroke (cerebrum) ([place]) - Acute MCA infarct due to left CCA/ICA and MCA occlusion, s/p mechanical thrombectomy and left ICA stenting in the setting of newly diagnosed afib 02/14/2017    [facility] ,MS, CCC-SLP  06/30/2017, 5:20 PM  [facility] [address] [place], [place], [address] Phone: [phone]   Fax:  [phone]   Name: Kaitlin [name] MRN: [id number] Date of Birth: Apr 11, 1946

## 2017-07-01 ENCOUNTER — Ambulatory Visit: Payer: Medicare Other

## 2017-07-01 ENCOUNTER — Telehealth (HOSPITAL_COMMUNITY): Payer: Self-pay

## 2017-07-01 ENCOUNTER — Other Ambulatory Visit: Payer: Self-pay

## 2017-07-01 ENCOUNTER — Ambulatory Visit: Payer: Medicare Other | Admitting: Physical Medicine & Rehabilitation

## 2017-07-01 ENCOUNTER — Encounter: Payer: Self-pay | Admitting: Physical Medicine & Rehabilitation

## 2017-07-01 VITALS — BP 143/72 | HR 53

## 2017-07-01 DIAGNOSIS — I69319 Unspecified symptoms and signs involving cognitive functions following cerebral infarction: Secondary | ICD-10-CM

## 2017-07-01 DIAGNOSIS — I63512 Cerebral infarction due to unspecified occlusion or stenosis of left middle cerebral artery: Secondary | ICD-10-CM | POA: Diagnosis not present

## 2017-07-01 DIAGNOSIS — R482 Apraxia: Secondary | ICD-10-CM

## 2017-07-01 DIAGNOSIS — R269 Unspecified abnormalities of gait and mobility: Secondary | ICD-10-CM

## 2017-07-01 DIAGNOSIS — I69398 Other sequelae of cerebral infarction: Secondary | ICD-10-CM

## 2017-07-01 DIAGNOSIS — R4701 Aphasia: Secondary | ICD-10-CM

## 2017-07-01 DIAGNOSIS — I69351 Hemiplegia and hemiparesis following cerebral infarction affecting right dominant side: Secondary | ICD-10-CM

## 2017-07-01 DIAGNOSIS — R41841 Cognitive communication deficit: Secondary | ICD-10-CM

## 2017-07-01 DIAGNOSIS — I69322 Dysarthria following cerebral infarction: Secondary | ICD-10-CM | POA: Diagnosis not present

## 2017-07-01 DIAGNOSIS — M6281 Muscle weakness (generalized): Secondary | ICD-10-CM | POA: Diagnosis not present

## 2017-07-01 NOTE — Telephone Encounter (Signed)
Pt's husband agreed to f/u in 6 months with US carotid per Dr. Corliss Skains. AW

## 2017-07-01 NOTE — Patient Instructions (Signed)
No Driving °

## 2017-07-01 NOTE — Patient Instructions (Signed)
  Please complete the assigned speech therapy homework prior to your next session and return it to the speech therapist at your next visit.  

## 2017-07-01 NOTE — Therapy (Signed)
[facility] [address] [place], [place], [address] Phone: [phone]   Fax:  [phone]  Speech Language Pathology Treatment  Patient Details  Name: Kaitlin [name] MRN: [id number] Date of Birth: 09-Sep-1945 Referring Provider: Alysia Penna, MD   Encounter Date: 07/01/2017  End of Session - 07/01/17 1635    Visit Number  15    Number of Visits  17    Date for SLP Re-Evaluation  07/11/17    SLP Start Time  1448    SLP Stop Time   1445    SLP Time Calculation (min)  42 min    Activity Tolerance  Patient tolerated treatment well       Past Medical History:  Diagnosis Date  . Abnormality of gait as late effect of stroke 02/23/2017  . Acute hypoxemic respiratory failure ([place])   . Atrial fibrillation with RVR ([place]) 02/17/2017  . CAD S/P percutaneous coronary angioplasty 2001  . CVA (cerebral vascular accident) ([place]) 01/2017  . Dysphagia as late effect of stroke 02/23/2017  . HCVD (hypertensive cardiovascular disease) 01/2017   normal LVF with moderate LVH, grade 1 DD  . High cholesterol   . Hypokalemia   . Labile blood pressure   . Leukocytosis   . Multiple sclerosis ([place])   . PAF (paroxysmal atrial fibrillation) ([place]) 01/2017  . Right hemiparesis [facility])     Past Surgical History:  Procedure Laterality Date  . CORONARY ANGIOPLASTY WITH STENT PLACEMENT  11/1999   [facility]  . IR ANGIO INTRA EXTRACRAN SEL COM CAROTID INNOMINATE UNI R MOD SED  02/15/2017  . IR ANGIO VERTEBRAL SEL SUBCLAVIAN INNOMINATE UNI R MOD SED  02/15/2017  . IR INTRAVSC STENT CERV CAROTID W/O EMB-PROT MOD SED INC ANGIO  02/15/2017  . IR PERCUTANEOUS ART THROMBECTOMY/INFUSION INTRACRANIAL INC DIAG ANGIO  02/15/2017  . IR RADIOLOGIST EVAL & MGMT  05/10/2017  . NECK SURGERY    . RADIOLOGY WITH ANESTHESIA N/A 02/14/2017   Procedure: RADIOLOGY WITH ANESTHESIA;  Surgeon: Radiologist, Medication, MD;  Location: [facility];  Service: Radiology;   Laterality: N/A;  . WRIST SURGERY      There were no vitals filed for this visit.  Subjective Assessment - 07/01/17 1224    Subjective  Husband"            ADULT SLP TREATMENT - 07/01/17 1628      General Information   Behavior/Cognition  Alert;Cooperative;Pleasant mood;Requires cueing;Confused      Treatment Provided   Treatment provided  Cognitive-Linquistic      Cognitive-Linquistic Treatment   Treatment focused on  Aphasia;Patient/family/caregiver education    Skilled Treatment  Bulk of session spent today answering questions of husband (and pt) re: aphasia software/apps and benefits of each program/app. SLP educated pt and husband re: each Ronie Spies, [place], [facility]) and afterwards SLP and pt and husband agreed [facility] is the best suited program for pt poast-d/c from skilled ST after two more sessions. Pt spoke in simple to mod complex conversation with SLP functionally with occasional min A with aphasic errors/anomia, and asked husband re: Thanksgiving food for 4/5 SLP questions - SLP believes genuinely due to pt decr'd memory and not due to apraxia or aphasia deficits. Pt demo'd understanding of questions in this conversation >80% of the time.      Assessment / Recommendations / Plan   Plan  Continue with current plan of care      Progression Toward Goals   Progression toward goals  Progressing toward goals       SLP Education - 07/01/17 1635    Education provided  Yes    Education Details  comparison of aphasia software/apps post d/c    Person(s) Educated  Patient;Spouse    Methods  Explanation    Comprehension  Verbalized understanding       SLP Short Term Goals - 06/21/17 1527      SLP SHORT TERM GOAL #1   Title  pt will generate 8 items in a simple category with compensations allowed    Status  Not Met      SLP SHORT TERM GOAL #2   Title  pt will provide sentence level responses using language compensations 80% success    Status  Not Met       SLP SHORT TERM GOAL #3   Title  pt will demo understanding of 5 minutes mod complex conversation, by providing appropriate responses in conversation 95% over three sessions    Status  Not Met      SLP SHORT TERM GOAL #4   Title  pt will demo appropriate breath support 95% for improvement in voice volume at word level    Status  Deferred       SLP Long Term Goals - 07/01/17 1636      SLP LONG TERM GOAL #1   Title  pt will demo appropriate breath support for adequate speech volume in 10 minutes simple conversation     Status  Deferred volume WFL/WNL consistently      SLP LONG TERM GOAL #2   Title  pt will demo understanding of 8 minutes min-mod complex conversation with request for repeats PRN    Status  Achieved      SLP LONG TERM GOAL #3   Title  Pt will complete mod complex naming tasks with occasional min A    Time  2    Period  Weeks    Status  On-going      SLP LONG TERM GOAL #4   Title  pt will produce 7 minutes simple conversation using compensations for anomia over two sessions    Time  2    Period  Weeks    Status  On-going       Plan - 07/01/17 1636    Clinical Impression Statement  Pt presents today with cont'd expressive aphasia, and receptive aphasia (expressive worse than receptive). See "skilled intervention" for more details. Pt will cont to receive skilled ST for approx 2-3 more visits to primarily address aphasia. Volume today WNL most of the time and reduced with pt decr;d memory for answer to questions.    Speech Therapy Frequency  2x / week    Treatment/Interventions  Language facilitation;Internal/external aids;Compensatory techniques;SLP instruction and feedback;Multimodal communcation approach;Cognitive reorganization;Functional tasks;Cueing hierarchy;Compensatory strategies;Patient/family education    Potential to Achieve Goals  Fair    Potential Considerations  Co-morbidities;Severity of impairments;Ability to learn/carryover information;Previous  level of function    Consulted and Agree with Plan of Care  Patient;Family member/caregiver    Family Member Consulted  husband, [name]       Patient will benefit from skilled therapeutic intervention in order to improve the following deficits and impairments:   Aphasia  Apraxia  Cognitive communication deficit    Problem List Patient Active Problem List   Diagnosis Date Noted  . Subarachnoid hemorrhage ([place]) 06/04/2017  . Multiple sclerosis ([place]) 05/12/2017  . CAD (coronary artery disease) 04/12/2017  . Hypertension 04/11/2017  .  Right hemiparesis ([place])   . Aspiration pneumonia of right lung ([place])   . Leukocytosis   . Hypokalemia   . Labile blood pressure   . Abnormality of gait as late effect of stroke 02/23/2017  . Dysarthria as late effect of stroke 02/23/2017  . Dysphagia as late effect of stroke 02/23/2017  . Acute ischemic left middle cerebral artery (MCA) stroke ([place]) 02/22/2017  . Paroxysmal A-fib ([place]) 02/17/2017  . Atrial fibrillation with RVR ([place]) 02/17/2017  . HLD (hyperlipidemia) 02/17/2017  . Acute hypoxemic respiratory failure ([place])   . Stroke (cerebrum) ([place]) - Acute MCA infarct due to left CCA/ICA and MCA occlusion, s/p mechanical thrombectomy and left ICA stenting in the setting of newly diagnosed afib 02/14/2017    [facility] ,MS, CCC-SLP  07/01/2017, 4:37 PM  [facility] [address] [place], [place], [address] Phone: [phone]   Fax:  [phone]   Name: Brieanne Mignone MRN: [id number] Date of Birth: 1945/09/17

## 2017-07-01 NOTE — Progress Notes (Signed)
Subjective:    Patient ID: Kaitlin ScrapeLinda Pflug, female    DOB: 09-Aug-1945, 71 y.o.   MRN: [id number] 71 y.o.female with history of CAD s/p PTCA, MS with gait disorder/memory loss, PAF who was admitted on 02/14/17 with reports of speech difficulty progressing to right sided weakness later that day. CTA head showed evidence of infarct left insular and left posterior putamen with occlusion of L-CCA and L-ICA from arch to distal cavernous segment and incidental finding of centrilobar emphysema in lung apices. She underwent cerebral angio with complete revascularization of occluded L-MCA with one pass Solitaire and stent assisted angioplasty of proximal L-ICA with IA integrelin. Follow up MRI brain done revealing acute confluent infarct involving the left putamen, caudate body, and corona radiata, 2 tiny cortical infarcts in the left MCA distribution and extensive white matter disease due to MS and chronic microvascular ischemia. She developed A fib with RVR requiring amiodarone and esmolol. Dr. Delton SeeNelson recommended Eliquis bid for A fib (as likely cause of stroke) and changing Brilinta to Plavix  HPI  Fall at home in bathroom, small SAH , placed in observation x 24H Graduated from PT and OT, still attending SLP Speech issues when she is in a hurry- word retrieval Using walker less except outside the house Pain Inventory Average Pain 0 Pain Right Now 0 My pain is no pain  In the last 24 hours, has pain interfered with the following? General activity 0 Relation with others 0 Enjoyment of life 0 What TIME of day is your pain at its worst? no pain Sleep (in general) Good  Pain is worse with: no pain Pain improves with: no pain Relief from Meds: 0  Mobility walk with assistance use a walker ability to climb steps?  yes do you drive?  no transfers alone  Function retired I need assistance with the following:  meal prep, household duties and shopping  Neuro/Psych No problems in this  area  Prior Studies Any changes since last visit?  no  Physicians involved in your care Any changes since last visit?  no   Family History  Problem Relation Age of Onset  . Heart disease Father   . Cancer Father   . Multiple sclerosis Sister   . Cancer Mother    Social History   Socioeconomic History  . Marital status: Married    Spouse name: Not on file  . Number of children: Not on file  . Years of education: Not on file  . Highest education level: Not on file  Social Needs  . Financial resource strain: Not on file  . Food insecurity - worry: Not on file  . Food insecurity - inability: Not on file  . Transportation needs - medical: Not on file  . Transportation needs - non-medical: Not on file  Occupational History  . Not on file  Tobacco Use  . Smoking status: Never Smoker  . Smokeless tobacco: Never Used  Substance and Sexual Activity  . Alcohol use: Yes    Alcohol/week: 1.8 oz    Types: 3 Glasses of wine per week    Comment: wine  . Drug use: No  . Sexual activity: Not on file  Other Topics Concern  . Not on file  Social History Narrative  . Not on file   Past Surgical History:  Procedure Laterality Date  . CORONARY ANGIOPLASTY WITH STENT PLACEMENT  11/1999   [facility] DES-[facility]  . IR ANGIO INTRA EXTRACRAN SEL COM CAROTID INNOMINATE UNI R MOD SED  02/15/2017  . IR ANGIO VERTEBRAL SEL SUBCLAVIAN INNOMINATE UNI R MOD SED  02/15/2017  . IR INTRAVSC STENT CERV CAROTID W/O EMB-PROT MOD SED INC ANGIO  02/15/2017  . IR PERCUTANEOUS ART THROMBECTOMY/INFUSION INTRACRANIAL INC DIAG ANGIO  02/15/2017  . IR RADIOLOGIST EVAL & MGMT  05/10/2017  . NECK SURGERY    . RADIOLOGY WITH ANESTHESIA N/A 02/14/2017   Procedure: RADIOLOGY WITH ANESTHESIA;  Surgeon: Radiologist, Medication, MD;  Location: [facility];  Service: Radiology;  Laterality: N/A;  . WRIST SURGERY     Past Medical History:  Diagnosis Date  . Abnormality of gait as late effect of stroke 02/23/2017  . Acute hypoxemic  respiratory failure (HCC)   . Atrial fibrillation with RVR (HCC) 02/17/2017  . CAD S/P percutaneous coronary angioplasty 2001  . CVA (cerebral vascular accident) (HCC) 01/2017  . Dysphagia as late effect of stroke 02/23/2017  . HCVD (hypertensive cardiovascular disease) 01/2017   normal LVF with moderate LVH, grade 1 DD  . High cholesterol   . Hypokalemia   . Labile blood pressure   . Leukocytosis   . Multiple sclerosis (HCC)   . PAF (paroxysmal atrial fibrillation) (HCC) 01/2017  . Right hemiparesis (HCC)    There were no vitals taken for this visit.  Opioid Risk Score:   Fall Risk Score:  `1  Depression screen PHQ 2/9  Depression screen [facility] 2/9 07/01/2017 03/31/2017  Decreased Interest 0 0  Down, Depressed, Hopeless 0 0  PHQ - 2 Score 0 0  Altered sleeping - 0  Tired, decreased energy - 0  Change in appetite - 0  Feeling bad or failure about yourself  - 0  Trouble concentrating - 0  Moving slowly or fidgety/restless - 0  Suicidal thoughts - 0  PHQ-9 Score - 0  Difficult doing work/chores - Not difficult at all      Review of Systems  Constitutional:       Easy bleeding  HENT: Negative.   Eyes: Negative.   Respiratory: Negative.   Cardiovascular: Negative.   Gastrointestinal: Negative.   Endocrine: Negative.   Genitourinary: Negative.   Musculoskeletal: Negative.   Skin: Negative.   Allergic/Immunologic: Negative.   Neurological: Negative.   Hematological: Negative.   Psychiatric/Behavioral: Negative.        Objective:   Physical Exam  Constitutional: She appears well-developed and well-nourished. No distress.  HENT:  Head: Normocephalic and atraumatic.  Eyes: Conjunctivae and EOM are normal. Pupils are equal, round, and reactive to light.  Infraorbital ecchymosis right eye  Neck: Normal range of motion. Neck supple.  Skin: She is not diaphoretic.  Psychiatric: Her affect is blunt. Her speech is delayed. She is slowed. Cognition and memory are  impaired. She expresses impulsivity.  Oriented to month after repeating several months in succession She is inattentive.  Nursing note and vitals reviewed. Strength is 4+ in the right deltoid bicep tricep grip hip flexor knee extensor ankle does flexor 5/5 in the left deltoid bicep tricep grip hip flexor knee extensor ankle dorsi flexor Gait is using a rolling walker no evidence of toe drag or knee instability Standing balance is fair she has difficulty standing with her feet together.         Assessment & Plan:  #1.  Left MCA distribution infarct causing a aphasia and right hemiparesis as well as contributing to cognitive deficits.  She had pre-existing cognitive deficits related to her multiple sclerosis. We discussed that I do not think she is good candidate  for driving and is unlikely to regain this ability. Physical medicine rehabilitation follow-up in 3 months We will need to keep up with her home exercise program once the therapy is discontinued.

## 2017-07-05 ENCOUNTER — Ambulatory Visit: Payer: Medicare Other | Attending: Physical Medicine & Rehabilitation | Admitting: Speech Pathology

## 2017-07-05 ENCOUNTER — Encounter: Payer: Medicare Other | Admitting: Occupational Therapy

## 2017-07-05 DIAGNOSIS — R41841 Cognitive communication deficit: Secondary | ICD-10-CM | POA: Diagnosis present

## 2017-07-05 DIAGNOSIS — R4701 Aphasia: Secondary | ICD-10-CM

## 2017-07-05 NOTE — Therapy (Signed)
[facility] [address] [place], [place], [address] Phone: [phone]   Fax:  [phone]  Speech Language Pathology Treatment  Patient Details  Name: Kaitlin [name] MRN: [id number] Date of Birth: 07-02-1946 Referring Provider: Alysia Penna, MD   Encounter Date: 07/05/2017  End of Session - 07/05/17 1504    Visit Number  16    Number of Visits  17    Date for SLP Re-Evaluation  07/11/17    SLP Start Time  1405    SLP Stop Time   1446    SLP Time Calculation (min)  41 min    Activity Tolerance  Patient tolerated treatment well       Past Medical History:  Diagnosis Date  . Abnormality of gait as late effect of stroke 02/23/2017  . Acute hypoxemic respiratory failure ([place])   . Atrial fibrillation with RVR ([place]) 02/17/2017  . CAD S/P percutaneous coronary angioplasty 2001  . CVA (cerebral vascular accident) ([place]) 01/2017  . Dysphagia as late effect of stroke 02/23/2017  . HCVD (hypertensive cardiovascular disease) 01/2017   normal LVF with moderate LVH, grade 1 DD  . High cholesterol   . Hypokalemia   . Labile blood pressure   . Leukocytosis   . Multiple sclerosis ([place])   . PAF (paroxysmal atrial fibrillation) ([place]) 01/2017  . Right hemiparesis [facility])     Past Surgical History:  Procedure Laterality Date  . CORONARY ANGIOPLASTY WITH STENT PLACEMENT  11/1999   [facility]CA [facility]  . IR ANGIO INTRA EXTRACRAN SEL COM CAROTID INNOMINATE UNI R MOD SED  02/15/2017  . IR ANGIO VERTEBRAL SEL SUBCLAVIAN INNOMINATE UNI R MOD SED  02/15/2017  . IR INTRAVSC STENT CERV CAROTID W/O EMB-PROT MOD SED INC ANGIO  02/15/2017  . IR PERCUTANEOUS ART THROMBECTOMY/INFUSION INTRACRANIAL INC DIAG ANGIO  02/15/2017  . IR RADIOLOGIST EVAL & MGMT  05/10/2017  . NECK SURGERY    . RADIOLOGY WITH ANESTHESIA N/A 02/14/2017   Procedure: RADIOLOGY WITH ANESTHESIA;  Surgeon: Radiologist, Medication, MD;  Location: [place];  Service: Radiology;   Laterality: N/A;  . WRIST SURGERY      There were no vitals filed for this visit.  Subjective Assessment - 07/05/17 1457    Subjective  "We didn't have any homework last time"    Patient is accompained by:  Family member spouse, [name]    Currently in Pain?  No/denies            ADULT SLP TREATMENT - 07/05/17 1458      General Information   Behavior/Cognition  Alert;Cooperative;Pleasant mood;Requires cueing;Confused      Treatment Provided   Treatment provided  Cognitive-Linquistic      Cognitive-Linquistic Treatment   Treatment focused on  Aphasia;Patient/family/caregiver education    Skilled Treatment  Facilitated compensations for aphasia, descring words for ST to ID with usual min questioning cues. Convergent naming of  mildly complex words with 80% accuracy and occasional min semantic cues and extended time. Pt completed mildly complex naming tasks with simple analogies with consisten min semantic cue ID'ng the relationship needed to complete the analogie. Once pt cued on relationship, she was 80% accurate in naming. Simple conversation re: grocery shopping, menus etc with usual mod A for  clarificaation due to aphsasia.      Assessment / Recommendations / Plan   Plan  Continue with current plan of care      Progression Toward Goals   Progression toward goals  Progressing toward  goals         SLP Short Term Goals - 07/05/17 1504      SLP SHORT TERM GOAL #1   Title  pt will generate 8 items in a simple category with compensations allowed    Status  Not Met      SLP SHORT TERM GOAL #2   Title  pt will provide sentence level responses using language compensations 80% success    Status  Not Met      SLP SHORT TERM GOAL #3   Title  pt will demo understanding of 5 minutes mod complex conversation, by providing appropriate responses in conversation 95% over three sessions    Status  Not Met      SLP SHORT TERM GOAL #4   Title  pt will demo appropriate breath support  95% for improvement in voice volume at word level    Status  Deferred       SLP Long Term Goals - 07/05/17 1504      SLP LONG TERM GOAL #1   Title  pt will demo appropriate breath support for adequate speech volume in 10 minutes simple conversation     Status  Deferred volume WFL/WNL consistently      SLP LONG TERM GOAL #2   Title  pt will demo understanding of 8 minutes min-mod complex conversation with request for repeats PRN    Status  Achieved      SLP LONG TERM GOAL #3   Title  Pt will complete mod complex naming tasks with occasional min A    Time  1    Period  Weeks    Status  On-going      SLP LONG TERM GOAL #4   Title  pt will produce 7 minutes simple conversation using compensations for anomia over two sessions    Time  1    Period  Weeks    Status  On-going       Plan - 07/05/17 1503    Clinical Impression Statement  Pt presents today with cont'd expressive aphasia, and receptive aphasia (expressive worse than receptive). See "skilled intervention" for more details. Pt will cont to receive skilled ST for approx 1 more visits to primarily address aphasia. Volume today WNL most of the time and reduced with pt decr;d memory for answer to questions.    Speech Therapy Frequency  2x / week    Treatment/Interventions  Language facilitation;Internal/external aids;Compensatory techniques;SLP instruction and feedback;Multimodal communcation approach;Cognitive reorganization;Functional tasks;Cueing hierarchy;Compensatory strategies;Patient/family education    Potential Considerations  Co-morbidities;Severity of impairments;Ability to learn/carryover information;Previous level of function    Consulted and Agree with Plan of Care  Patient;Family member/caregiver    Family Member Consulted  husband, [name]       Patient will benefit from skilled therapeutic intervention in order to improve the following deficits and impairments:   Aphasia  Cognitive communication  deficit    Problem List Patient Active Problem List   Diagnosis Date Noted  . Subarachnoid hemorrhage ([place]) 06/04/2017  . Multiple sclerosis ([place]) 05/12/2017  . CAD (coronary artery disease) 04/12/2017  . Hypertension 04/11/2017  . Right hemiparesis ([place])   . Aspiration pneumonia of right lung ([place])   . Leukocytosis   . Hypokalemia   . Labile blood pressure   . Abnormality of gait as late effect of stroke 02/23/2017  . Dysarthria as late effect of stroke 02/23/2017  . Dysphagia as late effect of stroke 02/23/2017  . Acute ischemic left  middle cerebral artery (MCA) stroke ([place]) 02/22/2017  . Paroxysmal A-fib ([place]) 02/17/2017  . Atrial fibrillation with RVR ([place]) 02/17/2017  . HLD (hyperlipidemia) 02/17/2017  . Acute hypoxemic respiratory failure ([place])   . Stroke (cerebrum) ([place]) - Acute MCA infarct due to left CCA/ICA and MCA occlusion, s/p mechanical thrombectomy and left ICA stenting in the setting of newly diagnosed afib 02/14/2017    Lovvorn, Annye Rusk MS, CCC-SLP 07/05/2017, 3:05 PM  [facility] [address] [place], [place], [address] Phone: [phone]   Fax:  [phone]   Name: Kaitlin [name] MRN: [id number] Date of Birth: [birth year]

## 2017-07-07 ENCOUNTER — Encounter: Payer: Medicare Other | Admitting: Speech Pathology

## 2017-07-07 ENCOUNTER — Ambulatory Visit: Payer: Medicare Other | Admitting: Physical Therapy

## 2017-07-07 ENCOUNTER — Encounter: Payer: Medicare Other | Admitting: Occupational Therapy

## 2017-07-13 ENCOUNTER — Ambulatory Visit: Payer: Medicare Other

## 2017-08-01 ENCOUNTER — Other Ambulatory Visit: Payer: Self-pay | Admitting: Physician Assistant

## 2017-08-01 DIAGNOSIS — I609 Nontraumatic subarachnoid hemorrhage, unspecified: Secondary | ICD-10-CM

## 2017-08-05 ENCOUNTER — Encounter: Payer: Self-pay | Admitting: Cardiology

## 2017-08-10 DIAGNOSIS — R69 Illness, unspecified: Secondary | ICD-10-CM | POA: Diagnosis not present

## 2017-08-18 DIAGNOSIS — I639 Cerebral infarction, unspecified: Secondary | ICD-10-CM | POA: Diagnosis not present

## 2017-08-18 DIAGNOSIS — I69351 Hemiplegia and hemiparesis following cerebral infarction affecting right dominant side: Secondary | ICD-10-CM | POA: Diagnosis not present

## 2017-08-18 DIAGNOSIS — G35 Multiple sclerosis: Secondary | ICD-10-CM | POA: Diagnosis not present

## 2017-08-18 DIAGNOSIS — R2689 Other abnormalities of gait and mobility: Secondary | ICD-10-CM | POA: Diagnosis not present

## 2017-09-18 DIAGNOSIS — G35 Multiple sclerosis: Secondary | ICD-10-CM | POA: Diagnosis not present

## 2017-09-18 DIAGNOSIS — R2689 Other abnormalities of gait and mobility: Secondary | ICD-10-CM | POA: Diagnosis not present

## 2017-09-18 DIAGNOSIS — I69351 Hemiplegia and hemiparesis following cerebral infarction affecting right dominant side: Secondary | ICD-10-CM | POA: Diagnosis not present

## 2017-09-18 DIAGNOSIS — I639 Cerebral infarction, unspecified: Secondary | ICD-10-CM | POA: Diagnosis not present

## 2017-09-22 ENCOUNTER — Ambulatory Visit: Payer: Medicare HMO | Admitting: Cardiology

## 2017-09-22 ENCOUNTER — Encounter: Payer: Self-pay | Admitting: Cardiology

## 2017-09-22 ENCOUNTER — Encounter: Payer: Self-pay | Admitting: *Deleted

## 2017-09-22 VITALS — BP 142/64 | HR 83 | Ht 62.0 in | Wt 135.0 lb

## 2017-09-22 DIAGNOSIS — I10 Essential (primary) hypertension: Secondary | ICD-10-CM

## 2017-09-22 DIAGNOSIS — I2510 Atherosclerotic heart disease of native coronary artery without angina pectoris: Secondary | ICD-10-CM

## 2017-09-22 DIAGNOSIS — I480 Paroxysmal atrial fibrillation: Secondary | ICD-10-CM

## 2017-09-22 DIAGNOSIS — E782 Mixed hyperlipidemia: Secondary | ICD-10-CM | POA: Diagnosis not present

## 2017-09-22 DIAGNOSIS — I6300 Cerebral infarction due to thrombosis of unspecified precerebral artery: Secondary | ICD-10-CM | POA: Diagnosis not present

## 2017-09-22 NOTE — Patient Instructions (Signed)

## 2017-09-22 NOTE — Progress Notes (Signed)
Cardiology Office Note    Date:  09/24/2017   ID:  Kaitlin [name], DOB 03-28-1946, MRN [id number]  PCP:  Kaitlin Greathouse, MD  Cardiologist: Dr. Delton See  Reason for visit: 6 moths follow up  History of Present Illness:  Kaitlin [name] is a 72 y.o. female a PMH significant for CAD, s/p remote RCA PCI 2001, HTN, HLD, and stable multiple sclerosis. She presented to [facility] 02/15/17 with a L MCA occlusion s/p LMCA PTA and LICA PTA/stenting. While on telemetry she had documented PAF and anticoagulation was initiated. She remained on Plavix and Xarelto.CHADSVASC=5. She was maintaining normal sinus rhythm on amiodarone and beta blocker. 2-D echo LVEF 65-70% with mild AS, no cardiac source of emboli. Patient was transferred to cardiac rehabilitation. Patient was discharged from inpatient rehabilitation 03/19/17.   06/17/2017 -  this 2 months follow-up, she was seen by Herma Carson, she's been complaining off episodes of feeling short of breath when she stands up or when she goes to the bathroom at night. However when she goes to cardiac rehabilitation and walks around for longer time she feels perfectly fine and has no shortness of breath chest pressure or tightness. 2 weeks ago she had a mechanical fall when she tripped over her walker and hit her head. She was hospitalized with finding of a right anterior frontal subarachnoid and parenchymal hemorrhage. The patient was continued on post Xarelto and Plavix, she supposed to follow with neurology in 2 weeks and if stable findings of her carotid stent her neurologist will consider discontinuation of Plavix. She denies any palpitations dizziness presyncope or syncope.  09/22/2017 - she comes accompanied by her husband, they enjoy walks and some light exercises, they used to be avid hikers, and hoping to start this summer again.  She denies any chest pain DOE, LE edema, palpitations or syncope, she has been compliant with her meds and had no side effects.  Past  Medical History:  Diagnosis Date  . Abnormality of gait as late effect of stroke 02/23/2017  . Acute hypoxemic respiratory failure (HCC)   . Atrial fibrillation with RVR (HCC) 02/17/2017  . CAD S/P percutaneous coronary angioplasty 2001  . CVA (cerebral vascular accident) (HCC) 01/2017  . Dysphagia as late effect of stroke 02/23/2017  . HCVD (hypertensive cardiovascular disease) 01/2017   normal LVF with moderate LVH, grade 1 DD  . High cholesterol   . Hypokalemia   . Labile blood pressure   . Leukocytosis   . Multiple sclerosis (HCC)   . PAF (paroxysmal atrial fibrillation) (HCC) 01/2017  . Right hemiparesis [facility])     Past Surgical History:  Procedure Laterality Date  . CORONARY ANGIOPLASTY WITH STENT PLACEMENT  11/1999   RCA DES-[facility]  . IR ANGIO INTRA EXTRACRAN SEL COM CAROTID INNOMINATE UNI R MOD SED  02/15/2017  . IR ANGIO VERTEBRAL SEL SUBCLAVIAN INNOMINATE UNI R MOD SED  02/15/2017  . IR INTRAVSC STENT CERV CAROTID W/O EMB-PROT MOD SED INC ANGIO  02/15/2017  . IR PERCUTANEOUS ART THROMBECTOMY/INFUSION INTRACRANIAL INC DIAG ANGIO  02/15/2017  . IR RADIOLOGIST EVAL & MGMT  05/10/2017  . NECK SURGERY    . RADIOLOGY WITH ANESTHESIA N/A 02/14/2017   Procedure: RADIOLOGY WITH ANESTHESIA;  Surgeon: Radiologist, Medication, MD;  Location: [facility] OR;  Service: Radiology;  Laterality: N/A;  . WRIST SURGERY      Current Medications: Current Meds  Medication Sig  . amiodarone (PACERONE) 200 MG tablet Take 1 tablet (200 mg total) by mouth daily.  Marland Kitchen  atorvastatin (LIPITOR) 40 MG tablet Take 1 tablet (40 mg total) by mouth daily at 6 PM.  . Calcium Citrate-Vitamin D (CALCIUM CITRATE + D PO) Take 1 tablet by mouth daily.  . clopidogrel (PLAVIX) 75 MG tablet Take 1 tablet (75 mg total) by mouth daily.  Marland Kitchen FLUoxetine (PROZAC) 10 MG capsule Take 1 capsule (10 mg total) by mouth daily.  . metoprolol tartrate (LOPRESSOR) 50 MG tablet Take 1 tablet (50 mg total) by mouth 2 (two) times daily.  .  Multiple Vitamins-Minerals (PRESERVISION AREDS PO) Take 1 tablet by mouth 2 (two) times daily.  . pantoprazole (PROTONIX) 40 MG tablet Take 1 tablet (40 mg total) by mouth daily.  . Rivaroxaban (XARELTO) 15 MG TABS tablet Take 1 tablet (15 mg total) by mouth daily with supper.  . venlafaxine XR (EFFEXOR-XR) 75 MG 24 hr capsule Take 1 capsule (75 mg total) by mouth daily.     Allergies:   Eliquis [apixaban] and Penicillins   Social History   Socioeconomic History  . Marital status: Married    Spouse name: None  . Number of children: None  . Years of education: None  . Highest education level: None  Social Needs  . Financial resource strain: None  . Food insecurity - worry: None  . Food insecurity - inability: None  . Transportation needs - medical: None  . Transportation needs - non-medical: None  Occupational History  . None  Tobacco Use  . Smoking status: Never Smoker  . Smokeless tobacco: Never Used  Substance and Sexual Activity  . Alcohol use: Yes    Alcohol/week: 1.8 oz    Types: 3 Glasses of wine per week    Comment: wine  . Drug use: No  . Sexual activity: None  Other Topics Concern  . None  Social History Narrative  . None     Family History:  The patient's family history includes Cancer in her father and mother; Heart disease in her father; Multiple sclerosis in her sister.   ROS:   Please see the history of present illness.    Review of Systems  Constitution: Negative.  HENT: Negative.   Eyes: Negative.   Cardiovascular: Negative.   Respiratory: Negative.   Hematologic/Lymphatic: Negative.   Musculoskeletal: Negative.  Negative for joint pain.  Gastrointestinal: Negative.   Genitourinary: Negative.   Neurological: Positive for difficulty with concentration, disturbances in coordination and loss of balance.  Psychiatric/Behavioral: Positive for memory loss.   All other systems reviewed and are negative.   PHYSICAL EXAM:   VS:  BP (!) 142/64    Pulse 83   Ht 5\' 2"  (1.575 m)   Wt 135 lb (61.2 kg)   SpO2 91%   BMI 24.69 kg/m   Physical Exam  GEN: Well nourished, well developed, in no acute distress  Neck: Bilateral carotid bruits no JVD,or masses Cardiac:RRR; 2/6 systolic murmur at the left sternal border Respiratory:  clear to auscultation bilaterally, normal work of breathing GI: soft, nontender, nondistended, + BS Ext: Small lesion where she bled anterior tibia from bumping it otherwise lower extremities without cyanosis, clubbing, or edema, Good distal pulses bilaterally Neuro:  Alert and Oriented x 3 Psych: euthymic mood, full affect  Wt Readings from Last 3 Encounters:  09/22/17 135 lb (61.2 kg)  06/17/17 126 lb (57.2 kg)  06/04/17 143 lb 3.2 oz (65 kg)      Studies/Labs Reviewed:   EKG:  EKG is  ordered today.  The ekg ordered today  demonstrates Normal sinus rhythm with T wave inversion inferior, anterior lateral changed from 2013 but similar to the hospital  Recent Labs: 02/18/2017: Magnesium 2.1 02/23/2017: ALT 81 06/04/2017: BUN 16; Creatinine, Ser 0.71; Hemoglobin 10.4; Platelets 235; Potassium 4.1; Sodium 135   Lipid Panel    Component Value Date/Time   CHOL 173 02/15/2017 0420   TRIG 145 02/15/2017 0420   HDL 70 02/15/2017 0420   CHOLHDL 2.5 02/15/2017 0420   VLDL 29 02/15/2017 0420   LDLCALC 74 02/15/2017 0420    Additional studies/ records that were reviewed today include:   Echo 02/16/17- Study Conclusions   - Left ventricle: The cavity size was normal. There was moderate   concentric hypertrophy. Systolic function was vigorous. The   estimated ejection fraction was in the range of 65% to 70%. Wall   motion was normal; there were no regional wall motion   abnormalities. Doppler parameters are consistent with abnormal   left ventricular relaxation (grade 1 diastolic dysfunction).   Doppler parameters are consistent with elevated ventricular   end-diastolic filling pressure. - Aortic valve:  Trileaflet; mildly thickened, mildly calcified   leaflets. There was mild stenosis. Mean gradient (S): 11 mm Hg.   Peak gradient (S): 17 mm Hg. Valve area (VTI): 1.45 cm^2. Valve   area (Vmax): 1.73 cm^2. Valve area (Vmean): 1.44 cm^2. - Mitral valve: Calcified annulus. Mildly thickened leaflets .   There was trivial regurgitation. - Left atrium: The atrium was normal in size. - Right ventricle: The cavity size was normal. Wall thickness was   normal. Systolic function was normal. - Right atrium: The atrium was normal in size. - Tricuspid valve: There was trivial regurgitation. - Pulmonary arteries: Systolic pressure was within the normal   range. - Inferior vena cava: The vessel was normal in size. - Pericardium, extracardiac: There was no pericardial effusion.   Impressions:   - No cardiac source of emboli was indentified.     ASSESSMENT:    1. Paroxysmal A-fib (HCC)   2. Mixed hyperlipidemia   3. Coronary artery disease involving native coronary artery of native heart without angina pectoris   4. Essential hypertension   5. Cerebrovascular accident (CVA) due to thrombosis of precerebral artery (HCC)      PLAN:  In order of problems listed above:  LMCA infarct:  S/p LMCA PTA and LICA PTA/stenting on 7/17.  She remains on plavix and xarelto (in the setting of afib).  Her Plavix might be possibly discontinued after her next carotid ultrasound. She has had 2 falls the last summer with subarachnoid hemorrhage but none since then I would continue for now.  PAF on amiodarone and Xarelto maintaining normal sinus rhythm, we'll continue Xarelto. No bleeding. Most recent Hb 10.4.  Hypertension - repeated 132/76 mmHg  Hyperlipidemia on Lipitor, tolerated well.  CAD with remote PCI to the RCA in 2001 with diffuse T-wave inversion on EKG similar to the hospital but new since 2013. Patient without cardiac symptoms. Told to call if she has any chest pain or shortness of breath. With  recent stroke, MS and lack of cardiac symptoms.   Medication Adjustments/Labs and Tests Ordered: Current medicines are reviewed at length with the patient today.  Concerns regarding medicines are outlined above.  Medication changes, Labs and Tests ordered today are listed in the Patient Instructions below. Patient Instructions  Medication Instructions:   Your physician recommends that you continue on your current medications as directed. Please refer to the Current Medication list given  to you today.     Follow-Up:  Your physician wants you to follow-up in: 6 MONTHS WITH DR Johnell Comings will receive a reminder letter in the mail two months in advance. If you don't receive a letter, please call our office to schedule the follow-up appointment.        If you need a refill on your cardiac medications before your next appointment, please call your pharmacy.      Signed, Tobias Alexander, MD  09/24/2017 2:19 PM    [facility] [address] [place], [place], [place]  [address] Phone: [phone]; Fax: [phone]

## 2017-09-23 ENCOUNTER — Ambulatory Visit: Payer: Medicare Other | Admitting: Cardiology

## 2017-09-29 ENCOUNTER — Ambulatory Visit: Payer: Medicare Other | Admitting: Physical Medicine & Rehabilitation

## 2017-09-30 ENCOUNTER — Encounter: Payer: Medicare HMO | Attending: Physical Medicine & Rehabilitation

## 2017-09-30 ENCOUNTER — Encounter: Payer: Self-pay | Admitting: Physical Medicine & Rehabilitation

## 2017-09-30 ENCOUNTER — Ambulatory Visit (HOSPITAL_BASED_OUTPATIENT_CLINIC_OR_DEPARTMENT_OTHER): Payer: Medicare HMO | Admitting: Physical Medicine & Rehabilitation

## 2017-09-30 ENCOUNTER — Other Ambulatory Visit: Payer: Self-pay

## 2017-09-30 VITALS — BP 146/75 | HR 58

## 2017-09-30 DIAGNOSIS — Z8249 Family history of ischemic heart disease and other diseases of the circulatory system: Secondary | ICD-10-CM | POA: Insufficient documentation

## 2017-09-30 DIAGNOSIS — I69319 Unspecified symptoms and signs involving cognitive functions following cerebral infarction: Secondary | ICD-10-CM

## 2017-09-30 DIAGNOSIS — I69351 Hemiplegia and hemiparesis following cerebral infarction affecting right dominant side: Secondary | ICD-10-CM | POA: Diagnosis not present

## 2017-09-30 DIAGNOSIS — Z95818 Presence of other cardiac implants and grafts: Secondary | ICD-10-CM | POA: Insufficient documentation

## 2017-09-30 DIAGNOSIS — G35 Multiple sclerosis: Secondary | ICD-10-CM | POA: Insufficient documentation

## 2017-09-30 DIAGNOSIS — I480 Paroxysmal atrial fibrillation: Secondary | ICD-10-CM | POA: Insufficient documentation

## 2017-09-30 DIAGNOSIS — Z9889 Other specified postprocedural states: Secondary | ICD-10-CM | POA: Insufficient documentation

## 2017-09-30 DIAGNOSIS — Z809 Family history of malignant neoplasm, unspecified: Secondary | ICD-10-CM | POA: Insufficient documentation

## 2017-09-30 NOTE — Progress Notes (Signed)
Subjective:  72 y.o.female with history of CAD s/p PTCA, MS with gait disorder/memory loss, PAF who was admitted on 02/14/17 with reports of speech difficulty progressing to right sided weakness later that day. CTA head showed evidence of infarct left insular and left posterior putamen with occlusion of L-CCA and L-ICA from arch to distal cavernous segment and incidental finding of centrilobar emphysema in lung apices. She underwent cerebral angio with complete revascularization of occluded L-MCA with one pass Solitaire and stent assisted angioplasty of proximal L-ICA with IA integrelin. Follow up MRI brain done revealing acute confluent infarct involving the left putamen, caudate body, and corona radiata, 2 tiny cortical infarcts in the left MCA distribution and extensive white matter disease due to MS and chronic microvascular ischemia. She developed A fib with RVR requiring amiodarone and esmolol. Dr. Delton See recommended Eliquis bid for A fib (as likely cause of stroke) and changing Brilinta to Plavix  Patient ID: Kaitlin [name], female    DOB: April 22, 1946, 72 y.o.   MRN: [id number]  HPI No longer using walker Step length improving Balance improving, last fall ~6wks ago with a minor fall, no injury Husband has purchased some software to work on her a aphasia. Husband also notes that in casual conversation some of her friends do not notice her cognitive deficits. Husband has questions in terms of why the patient seems so intact with her social conversation and still has significant cognitive deficits. Pain Inventory Average Pain 0 Pain Right Now 0 My pain is no pain  In the last 24 hours, has pain interfered with the following? General activity 0 Relation with others 0 Enjoyment of life 0 What TIME of day is your pain at its worst? no pain Sleep (in general) Good  Pain is worse with: no pain Pain improves with: no pain Relief from Meds: no pain  Mobility walk without  assistance how many minutes can you walk? 15 ability to climb steps?  no do you drive?  no  Function retired I need assistance with the following:  meal prep, household duties and shopping  Neuro/Psych confusion  Prior Studies Any changes since last visit?  no  Physicians involved in your care Any changes since last visit?  no   Family History  Problem Relation Age of Onset  . Heart disease Father   . Cancer Father   . Multiple sclerosis Sister   . Cancer Mother    Social History   Socioeconomic History  . Marital status: Married    Spouse name: None  . Number of children: None  . Years of education: None  . Highest education level: None  Social Needs  . Financial resource strain: None  . Food insecurity - worry: None  . Food insecurity - inability: None  . Transportation needs - medical: None  . Transportation needs - non-medical: None  Occupational History  . None  Tobacco Use  . Smoking status: Never Smoker  . Smokeless tobacco: Never Used  Substance and Sexual Activity  . Alcohol use: Yes    Alcohol/week: 1.8 oz    Types: 3 Glasses of wine per week    Comment: wine  . Drug use: No  . Sexual activity: None  Other Topics Concern  . None  Social History Narrative  . None   Past Surgical History:  Procedure Laterality Date  . CORONARY ANGIOPLASTY WITH STENT PLACEMENT  11/1999   [facility] DES[facility]  . IR ANGIO INTRA EXTRACRAN SEL COM CAROTID INNOMINATE UNI R MOD  SED  02/15/2017  . IR ANGIO VERTEBRAL SEL SUBCLAVIAN INNOMINATE UNI R MOD SED  02/15/2017  . IR INTRAVSC STENT CERV CAROTID W/O EMB-PROT MOD SED INC ANGIO  02/15/2017  . IR PERCUTANEOUS ART THROMBECTOMY/INFUSION INTRACRANIAL INC DIAG ANGIO  02/15/2017  . IR RADIOLOGIST EVAL & MGMT  05/10/2017  . NECK SURGERY    . RADIOLOGY WITH ANESTHESIA N/A 02/14/2017   Procedure: RADIOLOGY WITH ANESTHESIA;  Surgeon: Radiologist, Medication, MD;  Location: [facility] OR;  Service: Radiology;  Laterality: N/A;  . WRIST  SURGERY     Past Medical History:  Diagnosis Date  . Abnormality of gait as late effect of stroke 02/23/2017  . Acute hypoxemic respiratory failure (HCC)   . Atrial fibrillation with RVR (HCC) 02/17/2017  . CAD S/P percutaneous coronary angioplasty 2001  . CVA (cerebral vascular accident) (HCC) 01/2017  . Dysphagia as late effect of stroke 02/23/2017  . HCVD (hypertensive cardiovascular disease) 01/2017   normal LVF with moderate LVH, grade 1 DD  . High cholesterol   . Hypokalemia   . Labile blood pressure   . Leukocytosis   . Multiple sclerosis (HCC)   . PAF (paroxysmal atrial fibrillation) (HCC) 01/2017  . Right hemiparesis (HCC)    BP (!) 146/75   Pulse (!) 58   SpO2 93%   Opioid Risk Score:   Fall Risk Score:  `1  Depression screen PHQ 2/9  Depression screen [facility] 2/9 09/30/2017 07/01/2017 03/31/2017  Decreased Interest 0 0 0  Down, Depressed, Hopeless 0 0 0  PHQ - 2 Score 0 0 0  Altered sleeping - - 0  Tired, decreased energy - - 0  Change in appetite - - 0  Feeling bad or failure about yourself  - - 0  Trouble concentrating - - 0  Moving slowly or fidgety/restless - - 0  Suicidal thoughts - - 0  PHQ-9 Score - - 0  Difficult doing work/chores - - Not difficult at all    Review of Systems  Constitutional: Negative.   HENT: Negative.   Eyes: Negative.   Respiratory: Negative.   Cardiovascular: Negative.   Gastrointestinal: Negative.   Endocrine: Negative.   Genitourinary: Negative.   Musculoskeletal: Negative.   Skin: Negative.   Allergic/Immunologic: Negative.   Neurological: Negative.   Hematological: Negative.   Psychiatric/Behavioral: Positive for confusion.  All other systems reviewed and are negative.      Objective:   Physical Exam  Constitutional: She is oriented to person, place, and time. She appears well-developed and well-nourished. No distress.  HENT:  Head: Normocephalic and atraumatic.  Eyes: Conjunctivae and EOM are normal. Pupils are  equal, round, and reactive to light.  Neck: Normal range of motion.  Neurological: She is alert and oriented to person, place, and time. She displays no tremor. No sensory deficit. She exhibits normal muscle tone.  Motor strength is 4- at the right biceps triceps finger flexors and extensors 3- at the deltoid 4/5 in the right hip flexor knee extensor ankle dorsiflexor 5/5 in left deltoid, bicep, tricep, grip, hip flexor, knee extensor, ankle dorsiflexor Gait she has a wide basis support small step length.  She occasionally drags her right foot.  She is able to ambulate without assistive device with supervision. Romberg is negative   Skin: She is not diaphoretic.  Psychiatric: She has a normal mood and affect.  Nursing note and vitals reviewed.   naming intact for simple objects Orientation Alert and oriented to person only   remembers 0/3  objects after 3 min delay    Assessment & Plan:  1.  Right hemiparesis as residual from left insular cortex and putamen infarcts 02/14/2017.  She has plateaued physically and is at a supervision level mainly due to her cognitive deficits.  She still has some potential for improvement from a cognitive standpoint although she did have some prior cognitive deficits related to her multiple sclerosis.  From a motor standpoint I do not see any evidence of her multiple sclerosis. Discussed with her husband how cognitive deficits can be fairly specific pertaining only to memory or concentration but sparing other aspects of cognition.  Will make a referral to neuropsychology for further evaluation and to help guide further cognitive remediation. I will see the patient back in approximately 3 months

## 2017-10-11 ENCOUNTER — Encounter: Payer: Self-pay | Admitting: Speech Pathology

## 2017-10-11 NOTE — Therapy (Signed)
[place] [address] [place], [place], [address] Phone: [phone]   Fax:  [phone]  Patient Details  Name: Kaitlin [name] MRN: [id number] Date of Birth: 1946/01/28 Referring Provider:  No ref. provider found  Encounter Date: 10/11/2017   SPEECH THERAPY DISCHARGE SUMMARY  Visits from Start of Care: 16  Current functional level related to goals / functional outcomes: See goals below  SLP Short Term Goals - 10/11/17 1256      SLP SHORT TERM GOAL #1   Title  pt will generate 8 items in a simple category with compensations allowed    Status  Not Met      SLP SHORT TERM GOAL #2   Title  pt will provide sentence level responses using language compensations 80% success    Status  Not Met      SLP SHORT TERM GOAL #3   Title  pt will demo understanding of 5 minutes mod complex conversation, by providing appropriate responses in conversation 95% over three sessions    Status  Not Met      SLP SHORT TERM GOAL #4   Title  pt will demo appropriate breath support 95% for improvement in voice volume at word level    Status  Deferred      SLP Long Term Goals - 10/11/17 1256      SLP LONG TERM GOAL #1   Title  pt will demo appropriate breath support for adequate speech volume in 10 minutes simple conversation     Status  Deferred volume WFL/WNL consistently      SLP LONG TERM GOAL #2   Title  pt will demo understanding of 8 minutes min-mod complex conversation with request for repeats PRN    Status  Achieved      SLP LONG TERM GOAL #3   Title  Pt will complete mod complex naming tasks with occasional min A    Time  1    Period  Weeks    Status  Not met     SLP LONG TERM GOAL #4   Title  pt will produce 7 minutes simple conversation using compensations for anomia over two sessions    Time  1    Period  Weeks    Status  Not met        Remaining deficits: Aphasia, cognition   Education /  Equipment: Compensations for aphasia and dysarthria Plan: Patient agrees to discharge.  Patient goals were partially met. Patient is being discharged due to not returning since the last visit.  ?????        Lovvorn, Annye Rusk  MS, CCC-SLP 10/11/2017, 12:56 PM  [place] [address] [place], [place], [address] Phone: [phone]   Fax:  [phone]

## 2017-10-16 DIAGNOSIS — G35 Multiple sclerosis: Secondary | ICD-10-CM | POA: Diagnosis not present

## 2017-10-16 DIAGNOSIS — I69351 Hemiplegia and hemiparesis following cerebral infarction affecting right dominant side: Secondary | ICD-10-CM | POA: Diagnosis not present

## 2017-10-16 DIAGNOSIS — I639 Cerebral infarction, unspecified: Secondary | ICD-10-CM | POA: Diagnosis not present

## 2017-10-16 DIAGNOSIS — R2689 Other abnormalities of gait and mobility: Secondary | ICD-10-CM | POA: Diagnosis not present

## 2017-10-24 ENCOUNTER — Ambulatory Visit: Payer: Medicare Other | Admitting: Nurse Practitioner

## 2017-11-07 DIAGNOSIS — Z1231 Encounter for screening mammogram for malignant neoplasm of breast: Secondary | ICD-10-CM | POA: Diagnosis not present

## 2017-11-11 ENCOUNTER — Ambulatory Visit: Payer: Medicare HMO | Admitting: Neurology

## 2017-11-11 ENCOUNTER — Other Ambulatory Visit: Payer: Self-pay

## 2017-11-11 ENCOUNTER — Encounter: Payer: Self-pay | Admitting: Neurology

## 2017-11-11 VITALS — BP 147/66 | HR 54 | Resp 16 | Ht 62.0 in | Wt 138.5 lb

## 2017-11-11 DIAGNOSIS — G35 Multiple sclerosis: Secondary | ICD-10-CM | POA: Diagnosis not present

## 2017-11-11 DIAGNOSIS — G8191 Hemiplegia, unspecified affecting right dominant side: Secondary | ICD-10-CM

## 2017-11-11 DIAGNOSIS — I69319 Unspecified symptoms and signs involving cognitive functions following cerebral infarction: Secondary | ICD-10-CM | POA: Diagnosis not present

## 2017-11-11 DIAGNOSIS — I69398 Other sequelae of cerebral infarction: Secondary | ICD-10-CM | POA: Diagnosis not present

## 2017-11-11 DIAGNOSIS — R269 Unspecified abnormalities of gait and mobility: Secondary | ICD-10-CM | POA: Diagnosis not present

## 2017-11-11 NOTE — Progress Notes (Signed)
[facility]  PATIENT: Kaitlin [name] DOB: 1945-11-07  REFERRING DOCTOR OR PCP:  Dr. Pearlean Brownie M.D. SOURCE: Patient, hospital notes, images on PACS and CAD. Imaging and lab results.  _________________________________   HISTORICAL  CHIEF COMPLAINT:  Chief Complaint  Patient presents with  . Multiple Sclerosis    Sees Dr. Pearlean Brownie for hx. of stroke and reports compliance with Xarelto and Plavix.  Has been off of Betaseron for over 2 yrs. and sts. MS sx. have remained stable/fim  . Hx. of CVA    HISTORY OF PRESENT ILLNESS:  Kaitlin [name] is a 72 year old woman with long history of MS who had a stroke 02/14/2017.   Update 11/11/2017: She feels she has been stable the past 6 months.    She denies any new MS symptoms and has been off Betaseron a couple years now.    She had a stroke with right sided weakness 01/2017.   She is on Xarelto and Plavix.   She sees Dr. Pearlean Brownie.     The stroke was flet to be due to AFib and she is now on amiodarone.  She improved after the stroke.  She is now walking without a walker.   She can walk 5 minutes and sometimes longer but often feels winded quickly.     She had some memory issues before the stroke but is doing worse since the stroke.  She stopped driving.   She is sleeping well at night.   She has had some depression in the past associated with Betaseron but not now.   She is still on Effexor and Prozac.      From 05/12/2017 MS:   She was diagnosed in 2001 after presenting with right eye visual loss.   She was referred to Neuro-ophthalmology at [facility] and was referred to Dr. Leotis Shames.    She was placed on Betaseron and did well with no major exacerbation and one minor exacerbation in 2008 treated with IV steroids.    She stopped Betaseron in 2017 due to elevated LFTs.   She has not had any exacerbation since stopping Betaseron.    Her main symptom was fatigue and she was felt to have had a few more small exacerbations not requiring.    Prior to her  stroke she walked without a cane and she had very mild right sided weakness.   She was able to do hikes.    She had no bladder issues and vision had returned to baseline.     Her main problems were fatigue related.   She also had difficulty with mental fog and focus/attention.   She would have trouble at times while driving getting from one point to another.   She had depression on Betaseron and she was placed on Prozac and Effexor and has continued taking these.     Stroke:  She was admitted to [facility] after presenting 02/14/2017 was in right-sided weakness. MRI and CT showed acute left internal capsule/spatial ganglia stroke. The left M1 was hypertensive CT and CTA showed occlusion of the left M1 segment.  The stroke was felt to be embolic and she was placed on Xarelto) into. She had a carotid stent.   She is on AMiodarone also and hr AFib converted to NSR.   No cardiac problem was identified.    REVIEW OF SYSTEMS: Constitutional: No fevers, chills, sweats, or change in appetite Eyes: No visual changes, double vision, eye pain Ear, nose and throat: No hearing loss, ear pain, nasal  congestion, sore throat Cardiovascular: No chest pain, palpitations Respiratory: No shortness of breath at rest or with exertion.   No wheezes GastrointestinaI: No nausea, vomiting, diarrhea, abdominal pain, fecal incontinence Genitourinary: No dysuria, urinary retention or frequency.  No nocturia. Musculoskeletal: No neck pain, back pain Integumentary: No rash, pruritus, skin lesions Neurological: as above Psychiatric: No depression at this time.  No anxiety Endocrine: No palpitations, diaphoresis, change in appetite, change in weigh or increased thirst Hematologic/Lymphatic: No anemia, purpura, petechiae. Allergic/Immunologic: No itchy/runny eyes, nasal congestion, recent allergic reactions, rashes  ALLERGIES: Allergies  Allergen Reactions  . Eliquis [Apixaban] Rash  . Penicillins Other (See Comments)     From childhood     HOME MEDICATIONS:  Current Outpatient Medications:  .  amiodarone (PACERONE) 200 MG tablet, Take 1 tablet (200 mg total) by mouth daily., Disp: 30 tablet, Rfl: 0 .  atorvastatin (LIPITOR) 40 MG tablet, Take 1 tablet (40 mg total) by mouth daily at 6 PM., Disp: 30 tablet, Rfl: 0 .  Calcium Citrate-Vitamin D (CALCIUM CITRATE + D PO), Take 1 tablet by mouth daily., Disp: , Rfl:  .  clopidogrel (PLAVIX) 75 MG tablet, Take 1 tablet (75 mg total) by mouth daily., Disp: 30 tablet, Rfl: 0 .  FLUoxetine (PROZAC) 10 MG capsule, Take 1 capsule (10 mg total) by mouth daily., Disp: 30 capsule, Rfl: 0 .  metoprolol tartrate (LOPRESSOR) 50 MG tablet, Take 1 tablet (50 mg total) by mouth 2 (two) times daily., Disp: 60 tablet, Rfl: 0 .  Multiple Vitamins-Minerals (PRESERVISION AREDS PO), Take 1 tablet by mouth 2 (two) times daily., Disp: , Rfl:  .  pantoprazole (PROTONIX) 40 MG tablet, Take 1 tablet (40 mg total) by mouth daily., Disp: 30 tablet, Rfl: 0 .  Rivaroxaban (XARELTO) 15 MG TABS tablet, Take 1 tablet (15 mg total) by mouth daily with supper., Disp: 30 tablet, Rfl: 0 .  venlafaxine XR (EFFEXOR-XR) 75 MG 24 hr capsule, Take 1 capsule (75 mg total) by mouth daily., Disp: 30 capsule, Rfl: 0  PAST MEDICAL HISTORY: Past Medical History:  Diagnosis Date  . Abnormality of gait as late effect of stroke 02/23/2017  . Acute hypoxemic respiratory failure (HCC)   . Atrial fibrillation with RVR (HCC) 02/17/2017  . CAD S/P percutaneous coronary angioplasty 2001  . CVA (cerebral vascular accident) (HCC) 01/2017  . Dysphagia as late effect of stroke 02/23/2017  . HCVD (hypertensive cardiovascular disease) 01/2017   normal LVF with moderate LVH, grade 1 DD  . High cholesterol   . Hypokalemia   . Labile blood pressure   . Leukocytosis   . Multiple sclerosis (HCC)   . PAF (paroxysmal atrial fibrillation) (HCC) 01/2017  . Right hemiparesis (HCC)     PAST SURGICAL HISTORY: Past Surgical  History:  Procedure Laterality Date  . CORONARY ANGIOPLASTY WITH STENT PLACEMENT  11/1999   [facility]  . IR ANGIO INTRA EXTRACRAN SEL COM CAROTID INNOMINATE UNI R MOD SED  02/15/2017  . IR ANGIO VERTEBRAL SEL SUBCLAVIAN INNOMINATE UNI R MOD SED  02/15/2017  . IR INTRAVSC STENT CERV CAROTID W/O EMB-PROT MOD SED INC ANGIO  02/15/2017  . IR PERCUTANEOUS ART THROMBECTOMY/INFUSION INTRACRANIAL INC DIAG ANGIO  02/15/2017  . IR RADIOLOGIST EVAL & MGMT  05/10/2017  . NECK SURGERY    . RADIOLOGY WITH ANESTHESIA N/A 02/14/2017   Procedure: RADIOLOGY WITH ANESTHESIA;  Surgeon: Radiologist, Medication, MD;  Location: [facility] OR;  Service: Radiology;  Laterality: N/A;  . WRIST SURGERY  FAMILY HISTORY: Family History  Problem Relation Age of Onset  . Heart disease Father   . Cancer Father   . Multiple sclerosis Sister   . Cancer Mother     SOCIAL HISTORY:  Social History   Socioeconomic History  . Marital status: Married    Spouse name: Not on file  . Number of children: Not on file  . Years of education: Not on file  . Highest education level: Not on file  Occupational History  . Not on file  Social Needs  . Financial resource strain: Not on file  . Food insecurity:    Worry: Not on file    Inability: Not on file  . Transportation needs:    Medical: Not on file    Non-medical: Not on file  Tobacco Use  . Smoking status: Never Smoker  . Smokeless tobacco: Never Used  Substance and Sexual Activity  . Alcohol use: Yes    Alcohol/week: 1.8 oz    Types: 3 Glasses of wine per week    Comment: wine  . Drug use: No  . Sexual activity: Not on file  Lifestyle  . Physical activity:    Days per week: Not on file    Minutes per session: Not on file  . Stress: Not on file  Relationships  . Social connections:    Talks on phone: Not on file    Gets together: Not on file    Attends religious service: Not on file    Active member of club or organization: Not on file    Attends  meetings of clubs or organizations: Not on file    Relationship status: Not on file  . Intimate partner violence:    Fear of current or ex partner: Not on file    Emotionally abused: Not on file    Physically abused: Not on file    Forced sexual activity: Not on file  Other Topics Concern  . Not on file  Social History Narrative  . Not on file     PHYSICAL EXAM  Vitals:   11/11/17 1123  BP: (!) 147/66  Pulse: (!) 54  Resp: 16  Weight: 138 lb 8 oz (62.8 kg)  Height: 5\' 2"  (1.575 m)    Body mass index is 25.33 kg/m.   General: The patient is well-developed and well-nourished and in no acute distress   Neck: The neck is supple, no carotid bruits are noted.  The neck is nontender.  Cardiovascular: The heart has a regular rate and rhythm with a normal S1 and S2. There were no murmurs, gallops or rubs. Lungs are clear to auscultation.  Skin: Extremities are without significant edema or rash    Neurologic Exam  Mental status: The patient is alert and oriented x 2 (3/20/8 as date) at the time of the examination. The patient has apparent normal recent and remote memory [facility]), with an apparently normal attention span and concentration ability.   Speech shows mild word finding difficulty.  Cranial nerves: Extraocular movements are full. Pupils are equal, round, and reactive to light and accomodation.  Visual fields are full.  Facial symmetry is present. She reports reduced touch and temperature sensation in the right base.Facial strength is normal.  Trapezius and sternocleidomastoid strength is normal. No dysarthria is noted.  The tongue is midline, and the patient has symmetric elevation of the soft palate. No obvious hearing deficits are noted.  Motor:  Muscle bulk is normal.   Tone is mildly  increased on the right. Strength is 4 to 4+/5 in the legs, right worse than left.  Rapid alternating movements were slow on both sides  Sensory: She had reduced sensation to touch  and vibration on the right side relative to the left...  Coordination: Finger-nose-finger and heel to shin was performed poorly on the right  Gait and station: Station is normal.   She can rise out of the chair without difficulty.  The gait has a wide stance with reduced stride.  Her turn is unstable.  There is a mild right foot drop there is no Romberg sign.  Reflexes: Deep tendon reflexes are slightly increased in the right arm and increased in both legs, right greater than left..   There is spread at the knees.    DIAGNOSTIC DATA (LABS, IMAGING, TESTING) - I reviewed patient records, labs, notes, testing and imaging myself where available.  Lab Results  Component Value Date   WBC 7.4 06/04/2017   HGB 10.4 (L) 06/04/2017   HCT 32.2 (L) 06/04/2017   MCV 92.8 06/04/2017   PLT 235 06/04/2017      Component Value Date/Time   NA 135 06/04/2017 0407   K 4.1 06/04/2017 0407   CL 102 06/04/2017 0407   CO2 24 06/04/2017 0407   GLUCOSE 113 (H) 06/04/2017 0407   BUN 16 06/04/2017 0407   CREATININE 0.71 06/04/2017 0407   CALCIUM 8.7 (L) 06/04/2017 0407   PROT 6.2 (L) 02/23/2017 0556   ALBUMIN 2.7 (L) 02/23/2017 0556   AST 60 (H) 02/23/2017 0556   ALT 81 (H) 02/23/2017 0556   ALKPHOS 70 02/23/2017 0556   BILITOT 0.5 02/23/2017 0556   GFRNONAA >60 06/04/2017 0407   GFRAA >60 06/04/2017 0407   Lab Results  Component Value Date   CHOL 173 02/15/2017   HDL 70 02/15/2017   LDLCALC 74 02/15/2017   TRIG 145 02/15/2017   CHOLHDL 2.5 02/15/2017   Lab Results  Component Value Date   HGBA1C 5.2 02/15/2017   No results found for: VITAMINB12 No results found for: TSH     ASSESSMENT AND PLAN  Multiple sclerosis (HCC)  Right hemiparesis (HCC)  Cognitive deficit, post-stroke  Abnormality of gait as late effect of stroke    1.   Continue off Betaseron for the MS. 2.   She will continue to see Dr. Pearlean Brownie for stroke related issues he has placed her on Xarelto and she is also  on amiodarone for her A. Fib 3.    Stop Effexor and continue Prozac to simplify med's.    3.    Consider a stimulant or provigil/Nuvigil if fatigue 4.    She will return to see me in 12 months or sooner if there are new or worsening neurologic symptoms.  45 minute face-to-face evaluation with greater than one half the time counseling and coordinating care about her MS and related symptoms.   Richard A. Epimenio Foot, MD, [facility] 11/11/2017, 11:52 AM Certified in Neurology, Clinical Neurophysiology, Sleep Medicine, Pain Medicine and Neuroimaging  [facility] [address], [address] [place], [place] [address] [phone]

## 2017-11-16 DIAGNOSIS — I639 Cerebral infarction, unspecified: Secondary | ICD-10-CM | POA: Diagnosis not present

## 2017-11-16 DIAGNOSIS — R2689 Other abnormalities of gait and mobility: Secondary | ICD-10-CM | POA: Diagnosis not present

## 2017-11-16 DIAGNOSIS — G35 Multiple sclerosis: Secondary | ICD-10-CM | POA: Diagnosis not present

## 2017-11-16 DIAGNOSIS — I69351 Hemiplegia and hemiparesis following cerebral infarction affecting right dominant side: Secondary | ICD-10-CM | POA: Diagnosis not present

## 2017-11-21 DIAGNOSIS — M859 Disorder of bone density and structure, unspecified: Secondary | ICD-10-CM | POA: Diagnosis not present

## 2017-11-21 DIAGNOSIS — E7849 Other hyperlipidemia: Secondary | ICD-10-CM | POA: Diagnosis not present

## 2017-11-21 DIAGNOSIS — M8580 Other specified disorders of bone density and structure, unspecified site: Secondary | ICD-10-CM | POA: Diagnosis not present

## 2017-11-24 DIAGNOSIS — R748 Abnormal levels of other serum enzymes: Secondary | ICD-10-CM | POA: Diagnosis not present

## 2017-11-28 DIAGNOSIS — R413 Other amnesia: Secondary | ICD-10-CM | POA: Diagnosis not present

## 2017-11-28 DIAGNOSIS — D692 Other nonthrombocytopenic purpura: Secondary | ICD-10-CM | POA: Diagnosis not present

## 2017-11-28 DIAGNOSIS — I25119 Atherosclerotic heart disease of native coronary artery with unspecified angina pectoris: Secondary | ICD-10-CM | POA: Diagnosis not present

## 2017-11-28 DIAGNOSIS — G35 Multiple sclerosis: Secondary | ICD-10-CM | POA: Diagnosis not present

## 2017-11-28 DIAGNOSIS — M859 Disorder of bone density and structure, unspecified: Secondary | ICD-10-CM | POA: Diagnosis not present

## 2017-11-28 DIAGNOSIS — I4891 Unspecified atrial fibrillation: Secondary | ICD-10-CM | POA: Diagnosis not present

## 2017-11-28 DIAGNOSIS — Z0000 Encounter for general adult medical examination without abnormal findings: Secondary | ICD-10-CM | POA: Diagnosis not present

## 2017-11-28 DIAGNOSIS — I69959 Hemiplegia and hemiparesis following unspecified cerebrovascular disease affecting unspecified side: Secondary | ICD-10-CM | POA: Diagnosis not present

## 2017-11-28 DIAGNOSIS — Z7901 Long term (current) use of anticoagulants: Secondary | ICD-10-CM | POA: Diagnosis not present

## 2017-11-28 DIAGNOSIS — E7849 Other hyperlipidemia: Secondary | ICD-10-CM | POA: Diagnosis not present

## 2017-11-29 NOTE — Progress Notes (Signed)
[facility]  PATIENT: Kaitlin [name] DOB: Aug 08, 1945   REASON FOR VISIT: Follow-up for stroke in July 2018, long history of MS HISTORY FROM: Patient and husband    HISTORY OF PRESENT ILLNESS:  72 year caucasian lady seen today for the first office follow-up visit following hospital admission for stroke in July 2018. She is accompanied by husband. History is up 10 from them as well as review of electronic medical records and have personally reviewed imaging films.Kaitlin [name] an 72 y.o.femalewith a 20 year history of MS who presented as a Code Stroke for right sided weakness and garbled speech. Time of symptom onset was 11 AM while working in the yard, at which point she began to feel very hot and could not speak. She took a shower and symptoms seemed almost completely to resolve per husband. Subsequently, she had a nap and then at about 1700 she and her husband had a few glasses of wine, at which point he noticed that she was having some difficulty using her right side and it "was hard for her to get up", the latter symptom noted at about 1900. CT head showed focal hypodensity in left insula and left posterior putamen compatible with infarction with ASPECTS of8. Also noted was hyperdensity of thedistal left M1.CTA neck showed Occlusion of the left common and internal carotid artery from the arch to the distal cavernous segment.CTA head showed patent paraclinoid and terminal segments of left internal carotid artery and minimal flow in proximal left M1 likely retrograde via a tiny left posterior communicating artery. Occlusion of mid and distal left M1 with poor left MCA collateralization. Left A1 occlusion. Bilateral A2 are patent with the left likely perfused via the anterior communicating artery.  LSN:11 AM 02/14/17 tPA Given:No: Out of time window Of note, she has not had an MS exacerbation in several years and was recently taken off of Betaseron. Patient was  admitted to the intensive care unit where blood pressure was tightly controlled. She was extubated and did well. She was seen by physical speech and occupational therapy. She was started on initially aspirin and Brilinta by Dr. Corliss Skains forcarotid stent but subsequently switched to Plavix and eliquis when anticoagulation was started 5 days after her stroke and it was felt to be safe. She developed mild rash on both cheeks which was felt to be related to eliquis hence it was discontinued and she was started on Xarelto instead .She had some mild hematuria but hematocrit remained stable. She had mild expressive language difficulties and mild residual right-sided hemiparesis at the time of discharge. She was transferred to inpatient rehabilitation. She did well in rehabilitation and was discharged home. She is planning to start outpatient physical and occupation therapy soon and has finished home therapies. She is unrelenting and Xarelto and tolerating them well without bleeding episodes but did have significant episode of bruising of her right forearm last week when she accidentally hit her forearm on the edge of the bed. She states her blood pressure is well controlled and today it is 120/66. She is tolerating Lipitor well without muscle aches and pains. She feels she is physically about 80% better though she still has some occasional word finding difficulties and cannot speak Fluently. A fine finger movements and dexterity is also diminished in the right hand. She has a long-standing history of multiple sclerosis and was followed by Dr. Vergia Alcon but has not been on Betaseron for several years. She is asking for referral to Dr. Epimenio Foot  to follow her for her multiple sclerosis. UPDATE 5/1/2019CM Kaitlin [name], 72 year old female returns for follow-up with a history of stroke event July 2018 history of MS in the past.  She has been off of Betaseron for 2 years.  She is currently on Plavix and Xarelto for  secondary stroke prevention with minimal bruising and no bleeding she is on Lipitor without myalgias.  Blood pressure in the office today 153/66.  She continues to have some right-sided weakness from her stroke however her husband reports that her gait has been more unsteady the last week or so.  She has not had any falls she does not use an assistive device.  She no longer drives.  She has had some memory issues since her stroke and is due for neuropsych evaluation later in the month.  Recent labs primary care office Dr. Felipa Eth with elevated liver functions.  These are to be repeated in 2 months.  She returns for reevaluation  REVIEW OF SYSTEMS: Full 14 system review of systems performed and notable only for those listed, all others are neg:  Constitutional: neg  Cardiovascular: neg Ear/Nose/Throat: neg  Skin: neg Eyes: neg Respiratory: neg Gastroitestinal: neg  Hematology/Lymphatic: neg  Endocrine: Intolerance to heat Musculoskeletal:neg Allergy/Immunology: neg Neurological: Speech difficulty, memory loss, weakness Psychiatric: neg Sleep : neg   ALLERGIES: Allergies  Allergen Reactions  . Eliquis [Apixaban] Rash  . Penicillins Other (See Comments)    From childhood     HOME MEDICATIONS: Outpatient Medications Prior to Visit  Medication Sig Dispense Refill  . amiodarone (PACERONE) 200 MG tablet Take 1 tablet (200 mg total) by mouth daily. 30 tablet 0  . atorvastatin (LIPITOR) 40 MG tablet Take 1 tablet (40 mg total) by mouth daily at 6 PM. 30 tablet 0  . Calcium Citrate-Vitamin D (CALCIUM CITRATE + D PO) Take 1 tablet by mouth daily.    . cholecalciferol (VITAMIN D) 1000 units tablet Take 3,000 Units by mouth daily.    . clopidogrel (PLAVIX) 75 MG tablet Take 1 tablet (75 mg total) by mouth daily. 30 tablet 0  . FLUoxetine (PROZAC) 10 MG capsule Take 1 capsule (10 mg total) by mouth daily. 30 capsule 0  . metoprolol tartrate (LOPRESSOR) 50 MG tablet Take 1 tablet (50 mg total) by  mouth 2 (two) times daily. 60 tablet 0  . Multiple Vitamins-Minerals (PRESERVISION AREDS PO) Take 1 tablet by mouth 2 (two) times daily.    . pantoprazole (PROTONIX) 40 MG tablet Take 1 tablet (40 mg total) by mouth daily. 30 tablet 0  . Rivaroxaban (XARELTO) 15 MG TABS tablet Take 1 tablet (15 mg total) by mouth daily with supper. 30 tablet 0  . venlafaxine XR (EFFEXOR-XR) 75 MG 24 hr capsule Take 1 capsule (75 mg total) by mouth daily. (Patient not taking: Reported on 11/30/2017) 30 capsule 0   No facility-administered medications prior to visit.     PAST MEDICAL HISTORY: Past Medical History:  Diagnosis Date  . Abnormality of gait as late effect of stroke 02/23/2017  . Acute hypoxemic respiratory failure (HCC)   . Atrial fibrillation with RVR (HCC) 02/17/2017  . CAD S/P percutaneous coronary angioplasty 2001  . CVA (cerebral vascular accident) (HCC) 01/2017  . Dysphagia as late effect of stroke 02/23/2017  . HCVD (hypertensive cardiovascular disease) 01/2017   normal LVF with moderate LVH, grade 1 DD  . High cholesterol   . Hypokalemia   . Labile blood pressure   . Leukocytosis   . Multiple  sclerosis (HCC)   . PAF (paroxysmal atrial fibrillation) (HCC) 01/2017  . Right hemiparesis (HCC)     PAST SURGICAL HISTORY: Past Surgical History:  Procedure Laterality Date  . CORONARY ANGIOPLASTY WITH STENT PLACEMENT  11/1999   [facility]  . IR ANGIO INTRA EXTRACRAN SEL COM CAROTID INNOMINATE UNI R MOD SED  02/15/2017  . IR ANGIO VERTEBRAL SEL SUBCLAVIAN INNOMINATE UNI R MOD SED  02/15/2017  . IR INTRAVSC STENT CERV CAROTID W/O EMB-PROT MOD SED INC ANGIO  02/15/2017  . IR PERCUTANEOUS ART THROMBECTOMY/INFUSION INTRACRANIAL INC DIAG ANGIO  02/15/2017  . IR RADIOLOGIST EVAL & MGMT  05/10/2017  . NECK SURGERY    . RADIOLOGY WITH ANESTHESIA N/A 02/14/2017   Procedure: RADIOLOGY WITH ANESTHESIA;  Surgeon: Radiologist, Medication, MD;  Location: [facility] OR;  Service: Radiology;  Laterality: N/A;  .  WRIST SURGERY      FAMILY HISTORY: Family History  Problem Relation Age of Onset  . Heart disease Father   . Cancer Father   . Multiple sclerosis Sister   . Cancer Mother     SOCIAL HISTORY: Social History   Socioeconomic History  . Marital status: Married    Spouse name: Not on file  . Number of children: Not on file  . Years of education: Not on file  . Highest education level: Not on file  Occupational History  . Not on file  Social Needs  . Financial resource strain: Not on file  . Food insecurity:    Worry: Not on file    Inability: Not on file  . Transportation needs:    Medical: Not on file    Non-medical: Not on file  Tobacco Use  . Smoking status: Never Smoker  . Smokeless tobacco: Never Used  Substance and Sexual Activity  . Alcohol use: Yes    Alcohol/week: 1.8 oz    Types: 3 Glasses of wine per week    Comment: wine  . Drug use: No  . Sexual activity: Not on file  Lifestyle  . Physical activity:    Days per week: Not on file    Minutes per session: Not on file  . Stress: Not on file  Relationships  . Social connections:    Talks on phone: Not on file    Gets together: Not on file    Attends religious service: Not on file    Active member of club or organization: Not on file    Attends meetings of clubs or organizations: Not on file    Relationship status: Not on file  . Intimate partner violence:    Fear of current or ex partner: Not on file    Emotionally abused: Not on file    Physically abused: Not on file    Forced sexual activity: Not on file  Other Topics Concern  . Not on file  Social History Narrative  . Not on file     PHYSICAL EXAM  Vitals:   11/30/17 1055  BP: (!) 153/66  Pulse: (!) 52  Weight: 140 lb (63.5 kg)  Height: 5\' 2"  (1.575 m)   Body mass index is 25.61 kg/m.  Generalized: Well developed, in no acute distress  Head: normocephalic and atraumatic,. Oropharynx benign  Neck: Supple, no carotid bruits    Cardiac: Regular rate rhythm, no murmur  Musculoskeletal: No deformity   Neurological examination   Mentation: Alert oriented x 2. Attention span and concentration appropriate.  Speech shows mild word finding difficulties  Follows all  commands speech and language fluent.   Cranial nerve II-XII: .Pupils were equal round reactive to light extraocular movements were full, visual field were full on confrontational test. Facial sensation and strength were normal. hearing was intact to finger rubbing bilaterally. Uvula tongue midline. head turning and shoulder shrug were normal and symmetric.Tongue protrusion into cheek strength was normal. Motor: Muscle bulk is normal, tone  mildly increased on the right strength is 4 to 4+ out of 5 in the legs right worse than the left .  Rapid alternating movements are slow bilaterally Sensory: normal and symmetric to light touch, pinprick, and  Vibration, in the upper and lower extremity Coordination: finger-nose-finger, heel-to-shin bilaterally, performed poorly on the right Reflexes: 1+ upper lower and symmetric plantar responses were flexor bilaterally. Gait and Station: Rising up from seated position with push off, ambulates a short distance and has a limp on the right.  Unable to heel toe or tandem.  No assistive device  DIAGNOSTIC DATA (LABS, IMAGING, TESTING) - I reviewed patient records, labs, notes, testing and imaging myself where available.  Lab Results  Component Value Date   WBC 7.4 06/04/2017   HGB 10.4 (L) 06/04/2017   HCT 32.2 (L) 06/04/2017   MCV 92.8 06/04/2017   PLT 235 06/04/2017      Component Value Date/Time   NA 135 06/04/2017 0407   K 4.1 06/04/2017 0407   CL 102 06/04/2017 0407   CO2 24 06/04/2017 0407   GLUCOSE 113 (H) 06/04/2017 0407   BUN 16 06/04/2017 0407   CREATININE 0.71 06/04/2017 0407   CALCIUM 8.7 (L) 06/04/2017 0407   PROT 6.2 (L) 02/23/2017 0556   ALBUMIN 2.7 (L) 02/23/2017 0556   AST 60 (H) 02/23/2017 0556    ALT 81 (H) 02/23/2017 0556   ALKPHOS 70 02/23/2017 0556   BILITOT 0.5 02/23/2017 0556   GFRNONAA >60 06/04/2017 0407   GFRAA >60 06/04/2017 0407   Lab Results  Component Value Date   CHOL 173 02/15/2017   HDL 70 02/15/2017   LDLCALC 74 02/15/2017   TRIG 145 02/15/2017   CHOLHDL 2.5 02/15/2017   Lab Results  Component Value Date   HGBA1C 5.2 02/15/2017    ASSESSMENT AND PLAN    72 year old Caucasian lady with embolic left middle cerebral artery infarct in July 2018 secondary to new onset atrial fibrillation status post left ICA angioplasty stenting and left middle cerebral artery mechanical embolectomy with excellent clinical recovery. Remote history of possible multiple sclerosis which has been quite quiecent despite being off medication for 2 years.  More gait difficulty in the past week, no falls.The patient is a current patient of Dr. Pearlean Brownie  who is out of the office today . This note is sent to the work in doctor.      PLAN:Stressed the importance of management of risk factors to prevent further stroke Continue Xarelto and Plavix for secondary stroke prevention Maintain strict control of hypertension with blood pressure goal below 130/90,  continue antihypertensive medications Control of diabetes with hemoglobin A1c below 6.5 followed by primary care  Cholesterol with LDL cholesterol less than 70, followed by primary care,   continue statin drug Lipitor Will refer to PT evaluate and treat then HEP gait abnormality Repeat MRI of the brain new gait difficulty R/O stroke or new MS lesion  eat healthy diet with whole grains,  fresh fruits and vegetables Follow-up with primary care for stroke risk factor modification, maintain blood pressure goal less than 130 systolic, diabetes with  A1c below 7, lipids with LDL below 70  F/U here in 6 months Nilda Riggs, [facility], [facility], APRN  [facility] [address] [place], [place] [address] [phone]

## 2017-11-30 ENCOUNTER — Encounter: Payer: Self-pay | Admitting: Nurse Practitioner

## 2017-11-30 ENCOUNTER — Ambulatory Visit: Payer: Medicare HMO | Admitting: Nurse Practitioner

## 2017-11-30 VITALS — BP 153/66 | HR 52 | Ht 62.0 in | Wt 140.0 lb

## 2017-11-30 DIAGNOSIS — I69398 Other sequelae of cerebral infarction: Secondary | ICD-10-CM | POA: Diagnosis not present

## 2017-11-30 DIAGNOSIS — Z8673 Personal history of transient ischemic attack (TIA), and cerebral infarction without residual deficits: Secondary | ICD-10-CM | POA: Diagnosis not present

## 2017-11-30 DIAGNOSIS — R269 Unspecified abnormalities of gait and mobility: Secondary | ICD-10-CM | POA: Diagnosis not present

## 2017-11-30 DIAGNOSIS — G8191 Hemiplegia, unspecified affecting right dominant side: Secondary | ICD-10-CM

## 2017-11-30 DIAGNOSIS — G35 Multiple sclerosis: Secondary | ICD-10-CM

## 2017-11-30 DIAGNOSIS — I10 Essential (primary) hypertension: Secondary | ICD-10-CM | POA: Diagnosis not present

## 2017-11-30 DIAGNOSIS — E782 Mixed hyperlipidemia: Secondary | ICD-10-CM | POA: Diagnosis not present

## 2017-11-30 NOTE — Patient Instructions (Signed)
Stressed the importance of management of risk factors to prevent further stroke Continue Xarelto and Plavix for secondary stroke prevention Maintain strict control of hypertension with blood pressure goal below 130/90,  continue antihypertensive medications Control of diabetes with hemoglobin A1c below 6.5 followed by primary care  Cholesterol with LDL cholesterol less than 70, followed by primary care,   continue statin drug Lipitor Will refer to PT evaluate and treat then HEP Repeat MRI of the brain new gait difficulty R/O stroke or new MS lesion  eat healthy diet with whole grains,  fresh fruits and vegetables Follow-up with primary care for stroke risk factor modification, maintain blood pressure goal less than 130 systolic, diabetes with A1c below 7, lipids with LDL below 70  F/U here in 6 months

## 2017-12-02 DIAGNOSIS — Z1212 Encounter for screening for malignant neoplasm of rectum: Secondary | ICD-10-CM | POA: Diagnosis not present

## 2017-12-04 NOTE — Progress Notes (Signed)
I agree with the assessment and plan as directed by NP .The patient is known to the vascular service  Tanveer Brammer, MD

## 2017-12-08 ENCOUNTER — Ambulatory Visit
Admission: RE | Admit: 2017-12-08 | Discharge: 2017-12-08 | Disposition: A | Discharge status: Home / Self Care | Payer: Medicare HMO | Source: Ambulatory Visit | Attending: Nurse Practitioner | Admitting: Nurse Practitioner

## 2017-12-08 DIAGNOSIS — G8191 Hemiplegia, unspecified affecting right dominant side: Secondary | ICD-10-CM

## 2017-12-08 DIAGNOSIS — G35 Multiple sclerosis: Secondary | ICD-10-CM

## 2017-12-08 DIAGNOSIS — Z8673 Personal history of transient ischemic attack (TIA), and cerebral infarction without residual deficits: Secondary | ICD-10-CM

## 2017-12-08 DIAGNOSIS — I69398 Other sequelae of cerebral infarction: Secondary | ICD-10-CM

## 2017-12-08 DIAGNOSIS — R269 Unspecified abnormalities of gait and mobility: Secondary | ICD-10-CM

## 2017-12-08 MED ORDER — GADOBENATE DIMEGLUMINE 529 MG/ML IV SOLN
7.0000 mL | Freq: Once | INTRAVENOUS | Status: AC | PRN
  Administered 2017-12-08: 7 mL via INTRAVENOUS

## 2017-12-09 ENCOUNTER — Encounter: Payer: Medicare HMO | Attending: Physical Medicine & Rehabilitation | Admitting: Psychology

## 2017-12-09 ENCOUNTER — Telehealth: Payer: Self-pay | Admitting: *Deleted

## 2017-12-09 DIAGNOSIS — Z8249 Family history of ischemic heart disease and other diseases of the circulatory system: Secondary | ICD-10-CM | POA: Insufficient documentation

## 2017-12-09 DIAGNOSIS — I480 Paroxysmal atrial fibrillation: Secondary | ICD-10-CM | POA: Diagnosis not present

## 2017-12-09 DIAGNOSIS — I69351 Hemiplegia and hemiparesis following cerebral infarction affecting right dominant side: Secondary | ICD-10-CM | POA: Insufficient documentation

## 2017-12-09 DIAGNOSIS — Z9889 Other specified postprocedural states: Secondary | ICD-10-CM | POA: Insufficient documentation

## 2017-12-09 DIAGNOSIS — I69319 Unspecified symptoms and signs involving cognitive functions following cerebral infarction: Secondary | ICD-10-CM

## 2017-12-09 DIAGNOSIS — Z95818 Presence of other cardiac implants and grafts: Secondary | ICD-10-CM | POA: Diagnosis not present

## 2017-12-09 DIAGNOSIS — Z809 Family history of malignant neoplasm, unspecified: Secondary | ICD-10-CM | POA: Diagnosis not present

## 2017-12-09 DIAGNOSIS — G35 Multiple sclerosis: Secondary | ICD-10-CM | POA: Diagnosis not present

## 2017-12-09 NOTE — Telephone Encounter (Signed)
LVM informing patient that Kaitlin [name] discussed her MRI brain with Dr. Pearlean Brownie. The MRI of the brain shows nothing acute or new, some expected age related changes. Advised her the office is now closed; left number for any questions.

## 2017-12-12 ENCOUNTER — Other Ambulatory Visit (HOSPITAL_COMMUNITY): Payer: Self-pay | Admitting: Interventional Radiology

## 2017-12-12 ENCOUNTER — Telehealth (HOSPITAL_COMMUNITY): Payer: Self-pay

## 2017-12-12 NOTE — Telephone Encounter (Signed)
Called to schedule 6 month f/u us carotid, no answer, left vm. AW 

## 2017-12-16 DIAGNOSIS — I639 Cerebral infarction, unspecified: Secondary | ICD-10-CM | POA: Diagnosis not present

## 2017-12-16 DIAGNOSIS — R2689 Other abnormalities of gait and mobility: Secondary | ICD-10-CM | POA: Diagnosis not present

## 2017-12-16 DIAGNOSIS — G35 Multiple sclerosis: Secondary | ICD-10-CM | POA: Diagnosis not present

## 2017-12-16 DIAGNOSIS — I69351 Hemiplegia and hemiparesis following cerebral infarction affecting right dominant side: Secondary | ICD-10-CM | POA: Diagnosis not present

## 2017-12-22 ENCOUNTER — Ambulatory Visit: Payer: Medicare HMO | Admitting: Physical Therapy

## 2017-12-27 ENCOUNTER — Telehealth: Payer: Self-pay | Admitting: Physical Therapy

## 2017-12-27 ENCOUNTER — Encounter: Payer: Self-pay | Admitting: Physical Therapy

## 2017-12-27 ENCOUNTER — Ambulatory Visit: Payer: Medicare HMO | Attending: Nurse Practitioner | Admitting: Physical Therapy

## 2017-12-27 ENCOUNTER — Other Ambulatory Visit: Payer: Self-pay

## 2017-12-27 DIAGNOSIS — M79652 Pain in left thigh: Secondary | ICD-10-CM | POA: Diagnosis not present

## 2017-12-27 DIAGNOSIS — M6281 Muscle weakness (generalized): Secondary | ICD-10-CM | POA: Insufficient documentation

## 2017-12-27 DIAGNOSIS — R2681 Unsteadiness on feet: Secondary | ICD-10-CM | POA: Insufficient documentation

## 2017-12-27 DIAGNOSIS — R2689 Other abnormalities of gait and mobility: Secondary | ICD-10-CM | POA: Diagnosis not present

## 2017-12-27 NOTE — Therapy (Signed)
[facility] [address] [place], [place], [address] Phone: [phone]   Fax:  [phone]  Physical Therapy Evaluation  Patient Details  Name: Kaitlin [name] MRN: [id number] Date of Birth: October 08, 1945 Referring Provider: Nilda Riggs, NP   Encounter Date: 12/27/2017  PT End of Session - 12/27/17 2013    Visit Number  1    Number of Visits  1    Authorization Type  [employer]    Authorization Time Period  12/27/17 to 12/27/17    PT Start Time  0847    PT Stop Time  0930    PT Time Calculation (min)  43 min    Activity Tolerance  Patient limited by pain    Behavior During Therapy  [facility] for tasks assessed/performed       Past Medical History:  Diagnosis Date  . Abnormality of gait as late effect of stroke 02/23/2017  . Acute hypoxemic respiratory failure (HCC)   . Atrial fibrillation with RVR (HCC) 02/17/2017  . CAD S/P percutaneous coronary angioplasty 2001  . CVA (cerebral vascular accident) (HCC) 01/2017  . Dysphagia as late effect of stroke 02/23/2017  . HCVD (hypertensive cardiovascular disease) 01/2017   normal LVF with moderate LVH, grade 1 DD  . High cholesterol   . Hypokalemia   . Labile blood pressure   . Leukocytosis   . Multiple sclerosis (HCC)   . PAF (paroxysmal atrial fibrillation) (HCC) 01/2017  . Right hemiparesis [facility])     Past Surgical History:  Procedure Laterality Date  . CORONARY ANGIOPLASTY WITH STENT PLACEMENT  11/1999   [facility]  . IR ANGIO INTRA EXTRACRAN SEL COM CAROTID INNOMINATE UNI R MOD SED  02/15/2017  . IR ANGIO VERTEBRAL SEL SUBCLAVIAN INNOMINATE UNI R MOD SED  02/15/2017  . IR INTRAVSC STENT CERV CAROTID W/O EMB-PROT MOD SED INC ANGIO  02/15/2017  . IR PERCUTANEOUS ART THROMBECTOMY/INFUSION INTRACRANIAL INC DIAG ANGIO  02/15/2017  . IR RADIOLOGIST EVAL & MGMT  05/10/2017  . NECK SURGERY    . RADIOLOGY WITH ANESTHESIA N/A 02/14/2017   Procedure: RADIOLOGY WITH  ANESTHESIA;  Surgeon: Radiologist, Medication, MD;  Location: [facility] OR;  Service: Radiology;  Laterality: N/A;  . WRIST SURGERY      There were no vitals filed for this visit.   Subjective Assessment - 12/27/17 0849    Subjective  Husband reports normal visit on 5/1 to neurology, but NP noted increased weakness in legs. Patient was walking with no device at that time with some imbalance but no recent falls. NP ordered MRI due to h/o MS. On 5/9 went to MRI, but had "charlie horse in left thigh" and could not walk because of the pain. And ever since has lost the ability to walk. It's like her legs don't know what to do anymore. Can pivot slowly. Pain has lessened significantly, but still present.     Patient is accompained by:  Family member    Pertinent History  afib, CAD s/p PTCA, MS with gait disorder/memory loss, lt cca and ICA CVA 7/18  Extensive white matter disease; fall 06/2017 with [facility]     Limitations  Standing;Walking    How long can you sit comfortably?  unlimited    How long can you stand comfortably?  <1 minute    How long can you walk comfortably?  none    Diagnostic tests  MRI 12/08/17 with no acute changes    Patient Stated Goals  pt unable  to state; husband wants pt to be able to walk again    Currently in Pain?  Yes    Pain Score  -- pt could not rate # due to decr cognition    Pain Location  Leg thigh    Pain Orientation  Left    Pain Descriptors / Indicators  Guarding;Grimacing "nerve pain" per pt    Pain Type  Acute pain    Pain Onset  1 to 4 weeks ago    Pain Frequency  Intermittent    Aggravating Factors   weightbearing    Pain Relieving Factors  sitting, supine    Effect of Pain on Daily Activities  using w/c for all locomotion; requires assist for all transfers to/from wheelchair         [facility] PT Assessment - 12/27/17 0858      Assessment   Medical Diagnosis  Abnormality of gait as late effect of stroke; rt hemiparesis    Referring Provider  Nilda Riggs, NP    Onset Date/Surgical Date  -- referral to PT 11/30/17; onset lt thigh pain 12/08/17    Prior Therapy  after CVA 01/2017 until 06/2017 at this clinic      Precautions   Precautions  Fall      Restrictions   Weight Bearing Restrictions  No pt self-limits due to pain      Balance Screen   Has the patient fallen in the past 6 months  Yes    How many times?  2    Has the patient had a decrease in activity level because of a fear of falling?   No    Is the patient reluctant to leave their home because of a fear of falling?   No      Home Environment   Living Environment  Private residence    Living Arrangements  Spouse/significant other    Available Help at Discharge  Family;Personal care attendant;Available 24 hours/day    Type of Home  House    Home Access  Stairs to enter    Entrance Stairs-Number of Steps  1    Entrance Stairs-Rails  None    Home Layout  Two level    Alternate Level Stairs-Number of Steps  5    Alternate Level Stairs-Rails  Right    Home Equipment  Walker - 2 wheels;Walker - 4 wheels;Shower seat;Wheelchair - manual;Bedside commode      Prior Function   Level of Independence  Needs assistance with gait;Needs assistance with ADLs    Comments  husband reports independent, however requires 24/7 supervision due to decr cognition and imbalance per his description      Cognition   Overall Cognitive Status  History of cognitive impairments - at baseline    Memory  Impaired    Memory Impairment  Retrieval deficit;Decreased recall of new information      Observation/Other Assessments   Observations  arrives in her wheelchair with husband pushing and pt holding legs off the floor; in supine, noted LLE appears ~1/2 inch longer than RLE    Focus on Therapeutic Outcomes (FOTO)   NA      Observation/Other Assessments-Edema    Edema  -- bil feet swollen (mild)      Coordination   Gross Motor Movements are Fluid and Coordinated  No    Fine Motor Movements are  Fluid and Coordinated  No    Coordination and Movement Description  Rapid toe tapping degrades  Heel Shin Test  -- poor ability with LLE due to thigh pain      ROM / Strength   AROM / PROM / Strength  AROM;Strength      AROM   Overall AROM   Within functional limits for tasks performed bil LEs      Strength   Overall Strength  Deficits    Overall Strength Comments  LLE: hip flexion 2+, hip abdct 2+, knee extension 4+, knee flexion 4, RLE: hip flexion 3+, Knee extension 4, knee flexion 3+      Flexibility   Soft Tissue Assessment /Muscle Length  yes    Quadriceps  palpable band of tightness mid-quadriceps left thigh with tenderness (pt notes pain in this area with resisted hip flexion and hip abduction); surprisingly denied pain with resisted knee extension      Bed Mobility   Bed Mobility  Rolling Right;Rolling Left;Left Sidelying to Sit;Sit to Supine    Rolling Right  5: Supervision    Rolling Right Details (indicate cue type and reason)  vc     Rolling Left  5: Supervision    Rolling Left Details (indicate cue type and reason)  vc    Left Sidelying to Sit  4: Min assist    Sit to Supine  4: Min assist      Transfers   Transfers  Stand Pivot Transfers    Stand Pivot Transfers  3: Mod assist    Stand Pivot Transfer Details (indicate cue type and reason)  to pt's right with mod assist for posterior lean and inability to fully turn prior to initiating stand to sit; to pt's left mod assist with poor ability to take weight on LLE to step/pivot RLE      Ambulation/Gait   Gait Comments  unable due to pain in left thigh      Balance   Balance Assessed  Yes      Static Sitting Balance   Static Sitting - Balance Support  Feet supported;No upper extremity supported    Static Sitting - Level of Assistance  5: Stand by assistance due to decr cognition for safety    Static Sitting - Comment/# of Minutes  5 minutes      Static Standing Balance   Static Standing - Balance Support   Right upper extremity supported    Static Standing - Level of Assistance  4: Min assist    Static Standing - Comment/# of Minutes  <1 minute with posterior lean                Objective measurements completed on examination: See above findings.              PT Education - 12/27/17 2011    Education provided  Yes    Education Details  concern re: degree of left thigh pain this many weeks after initial onset of pain (with unknown cause); need to see MD to clear her for OK to continue PT vs ?need for xray    Person(s) Educated  Patient;Spouse    Methods  Explanation    Comprehension  Verbalized understanding                  Plan - 12/27/17 2015    Clinical Impression Statement  Patient initially referred to OPPT on 11/30/17 after follow-up in neurology office when found to have increased leg weakness and further impaired ambulation. Since being referred, pt sustained an injury to her left thigh on  12/08/17 that caused her to begin using her wheelchair full-time. Husband reports she has not been to see a doctor since the pain began. He reports she initially had a very large "charlie horse" in left thigh that has significantly decreased. Upon today's assessment feel patient must be evaluated by a physician to determine if OK to proceed with OPPT. Attempted to contact referring NP Nilda Riggs, [facility]) via phone while pt/husband present. Sent "telephone encounter" via EPIC (EMR) with request for NP to follow-up with pt/husband re: next course of actition. Noted pt/husband were contacted later in the day by the Neurology office and instructed to see her PCP. Husband was instructed to call for follow-up PT appointment if MD clears her to continue. Full assessment (interventions, plan, goals) to be completed at that time.     History and Personal Factors relevant to plan of care:  PMH-afib, CAD s/p PTCA, MS with gait disorder/memory loss, lt cca and  ICA CVA 7/18; Extensive white matter disease with decr cognition; fall 06/2017 with [facility]  Personal factors- expected prognosis for improvement (based on unknown severity of lt thigh injury)    Clinical Presentation  Unstable    Clinical Presentation due to:  unknown cause of recent severe lt thigh pain    Clinical Decision Making  High    Clinical Impairments Affecting Rehab Potential  lt thigh pain of unknown origin; decr memory    PT Frequency  -- TBA    PT Duration  -- TBA    PT Treatment/Interventions  -- TBA    PT Next Visit Plan  If pt returns with MD clearance to proceed, assess gait with RW; establish plan, goals (?2x/week x 8 weeks) do certification   Recommended Other Services  MD clearance to proceed with OPPT s/p Lt thigh injury/pain    Consulted and Agree with Plan of Care  Patient;Family member/caregiver    Family Member Consulted  [name], husband       Patient will benefit from skilled therapeutic intervention in order to improve the following deficits and impairments:  (TBA if pt cleared for PT)  Visit Diagnosis: Unsteadiness on feet  Other abnormalities of gait and mobility  Muscle weakness (generalized)  Pain in left thigh     Problem List Patient Active Problem List   Diagnosis Date Noted  . History of stroke 11/30/2017  . Cognitive deficit, post-stroke 09/30/2017  . Subarachnoid hemorrhage (HCC) 06/04/2017  . Multiple sclerosis (HCC) 05/12/2017  . CAD (coronary artery disease) 04/12/2017  . Hypertension 04/11/2017  . Right hemiparesis (HCC)   . Aspiration pneumonia of right lung (HCC)   . Leukocytosis   . Hypokalemia   . Labile blood pressure   . Abnormality of gait as late effect of stroke 02/23/2017  . Dysarthria as late effect of stroke 02/23/2017  . Dysphagia as late effect of stroke 02/23/2017  . Acute ischemic left middle cerebral artery (MCA) stroke (HCC) 02/22/2017  . Paroxysmal A-fib (HCC) 02/17/2017  . Atrial fibrillation with RVR (HCC)  02/17/2017  . HLD (hyperlipidemia) 02/17/2017  . Acute hypoxemic respiratory failure (HCC)   . Stroke (cerebrum) (HCC) - Acute MCA infarct due to left CCA/ICA and MCA occlusion, s/p mechanical thrombectomy and left ICA stenting in the setting of newly diagnosed afib 02/14/2017  . Atherosclerotic cardiovascular disease 11/07/2013    Zena Amos, PT 12/27/2017, 8:43 PM  [facility] [address] [place], [place], [address] Phone: [phone]   Fax:  [phone]  Name: Alexandrina Zehner MRN: [id number] Date of Birth: June 23, 1946

## 2017-12-27 NOTE — Telephone Encounter (Signed)
Ms. Kaitlin Low, NP  Thank you for referring [name] for OPPT at the [facility] center. She presented today for her evaluation using a wheelchair due to sudden onset of left thigh pain on 12/08/17. Her husband reports the pain has caused her to be unable to walk since then. She does stand and pivot, however it is with much effort and difficulty advancing LLE.   I feel she should be cleared by a physician (or yourself) to continue with Physical Therapy. I attempted to call your office while she was here without success in getting through. Please follow-up with the patient/patient's husband re: your recommendations. (Including if you would prefer she be seen by her PCP).  Thank you for your assistance.   Veda Canning, PT Outpatient Neurorehabilitation [address] [place], [place] [address] [phone]

## 2017-12-27 NOTE — Telephone Encounter (Signed)
Please have patient follow-up with primary care regarding sudden left thigh pain.  Recent MRI of the brain 12/09/2017 with nothing acute, age-related changes

## 2017-12-27 NOTE — Telephone Encounter (Signed)
Spoke with patient's husband, [name] on [place] and advised him that NP would like patient to call her PCP, Dr Felipa Eth to evaluate new onset of thigh pain.  Advised him her recent MRI showed no acute changes. [name] verbalized understanding, agreement, appreciation of call back.

## 2017-12-30 ENCOUNTER — Ambulatory Visit: Payer: Medicare HMO | Admitting: Physical Medicine & Rehabilitation

## 2017-12-30 DIAGNOSIS — M79605 Pain in left leg: Secondary | ICD-10-CM | POA: Diagnosis not present

## 2017-12-30 DIAGNOSIS — Z6825 Body mass index (BMI) 25.0-25.9, adult: Secondary | ICD-10-CM | POA: Diagnosis not present

## 2018-01-16 DIAGNOSIS — R2689 Other abnormalities of gait and mobility: Secondary | ICD-10-CM | POA: Diagnosis not present

## 2018-01-16 DIAGNOSIS — I69351 Hemiplegia and hemiparesis following cerebral infarction affecting right dominant side: Secondary | ICD-10-CM | POA: Diagnosis not present

## 2018-01-16 DIAGNOSIS — G35 Multiple sclerosis: Secondary | ICD-10-CM | POA: Diagnosis not present

## 2018-01-16 DIAGNOSIS — I639 Cerebral infarction, unspecified: Secondary | ICD-10-CM | POA: Diagnosis not present

## 2018-01-18 ENCOUNTER — Encounter: Payer: Self-pay | Admitting: Psychology

## 2018-01-18 NOTE — Progress Notes (Signed)
Neuropsychological Consultation   Patient:   Kaitlin [name]   DOB:   [birth year]  MR Number:  [id number]  Location:  [facility] FOR PAIN AND [facility] MEDICINE [facility] PHYSICAL MEDICINE AND REHABILITATION [address], [place] [address] [id number] [place] [address] Dept: [phone]           Date of Service:   12/09/2017  Start Time:   11 AM End Time:   12 PM  Provider/Observer:  Arley Phenix, Psy.D.       Clinical Neuropsychologist       Billing Code/Service: Neurobehavioral status exam  Chief Complaint:    Kaitlin [name] Caucasian female referred neuropsychological evaluation due to short-term memory difficulties.  The patient has a 20-year history of multiple sclerosis but more than 10 years without an MS exacerbation.  She was recently taken off her MS medications due to changes in her liver.  The patient also had a stroke in July 2018.  The patient reports that there is questions about whether her memory problems are due to to an exacerbation of her MS versus resulting impact of her stroke.  Patient reports that she has difficulty remembering what she did the day before and cannot remember what she had for dinner the night before.  The patient reports that her sleep patterns are good and appetite are good.  The patient reports that she cannot be left alone and does have home care help 10 to 12 hours/week.  Most of these changes and difficulties have occurred past year.  Reason for Service:  Kaitlin [name] is a 72 year old Caucasian female who has a 20-year history with MS.  The patient reports that she has gone 10 years without any MS exacerbation.  She was taken off of her MS medications due to changes in her liver.  However, the patient had a stroke on 716/2018 that resulted in speech difficulty progressing to right side weakness later in the day.  The patient had evidence of a infarct in the left insular and left posterior putamen with occlusion of the  L-CCA and L-ICA blood vessels.  She underwent cerebral angio complete revascularization of occluded L-MCA.  Follow-up MRI revealed acute fluid infarct involving the left vitamin, caudate body and corona radiata, 2 tiny cortical infarcts in the left MCA distribution extensive white matter disease due to MS and chronic microvascular ischemia.    Current Status:  The patient describes short-term memory qualities occult he is learning new information.  She also has changes in geographic orientation.  Reliability of Information: Information is derived from a review of available medical records as well as a 1 hour face-to-face clinical interview with the patient  Behavioral Observation: Kaitlin [name]  presents as a 72 y.o.-year-old Left Caucasian Female who appeared her stated age. her dress was Appropriate and she was Well Groomed and her manners were Appropriate to the situation.  her participation was indicative of Appropriate and Attentive behaviors.  There were any physical disabilities noted.  she displayed an appropriate level of cooperation and motivation.     Interactions:    Active Appropriate and Attentive  Attention:   within normal limits and attention span and concentration were age appropriate  Memory:   abnormal; remote memory intact, recent memory impaired  Visuo-spatial:  not examined  Speech (Volume):  normal  Speech:   normal; normal  Thought Process:  Coherent and Relevant  Though Content:  WNL; not suicidal and not homicidal  Orientation:   person, place, time/date and  situation  Judgment:   Good  Planning:   Good  Affect:    Appropriate  Mood:    Depressed  Insight:   Good  Intelligence:   high  Marital Status/Living: The patient is married and is been married to her husband for 51 years.  She has no prior marriages and has no children.  The patient was born in [place] and was an only child.  She continues to live with her husband.  Current  Employment: The patient is retired.  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  Hobbies and interests include working with plants, travel, and hiking.  Substance Use:  No concerns of substance abuse are reported.    Education:   The patient graduated with her bachelor's of science degree with a 3.0 GPA average.  She attended [employer].  Her best subject was [place] and had some difficulty with math but no significant problems noted.  Medical History:   Past Medical History:  Diagnosis Date  . Abnormality of gait as late effect of stroke 02/23/2017  . Acute hypoxemic respiratory failure (HCC)   . Atrial fibrillation with RVR (HCC) 02/17/2017  . CAD S/P percutaneous coronary angioplasty 2001  . CVA (cerebral vascular accident) (HCC) 01/2017  . Dysphagia as late effect of stroke 02/23/2017  . HCVD (hypertensive cardiovascular disease) 01/2017   normal LVF with moderate LVH, grade 1 DD  . High cholesterol   . Hypokalemia   . Labile blood pressure   . Leukocytosis   . Multiple sclerosis (HCC)   . PAF (paroxysmal atrial fibrillation) (HCC) 01/2017  . Right hemiparesis [facility])        Psychiatric History:  No prior psychiatric history.  Family Med/Psych History:  Family History  Problem Relation Age of Onset  . Heart disease Father   . Cancer Father   . Multiple sclerosis Sister   . Cancer Mother     Risk of Suicide/Violence: virtually non-existent   Impression/DX:  Kaitlin [name] Caucasian female referred neuropsychological evaluation due to short-term memory difficulties.  The patient has a 20-year history of multiple sclerosis but more than 10 years without an MS exacerbation.  She was recently taken off her MS medications due to changes in her liver.  The patient also had a stroke in July 2018.  The patient reports that there is questions about whether her memory problems  are due to to an exacerbation of her MS versus resulting impact of her stroke.  Patient reports that she has difficulty remembering what she did the day before and cannot remember what she had for dinner the night before.  The patient reports that her sleep patterns are good and appetite are good.  The patient reports that she cannot be left alone and does have home care help 10 to 12 hours/week.  Most of these changes and difficulties have occurred past year.  The patient describes short-term memory qualities occult he is learning new information.  She also has changes in geographic orientation.  Disposition/Plan:  I set the patient up for formal neuropsychological testing utilizing the RBANS neuropsychological test battery.  Diagnosis:    Cognitive deficit, post-stroke  Cognitive deficit due to recent stroke  Multiple sclerosis, relapsing-remitting (HCC)         Electronically Signed   _______________________ Arley Phenix, Psy.D.

## 2018-01-23 DIAGNOSIS — R413 Other amnesia: Secondary | ICD-10-CM | POA: Diagnosis not present

## 2018-01-23 DIAGNOSIS — M859 Disorder of bone density and structure, unspecified: Secondary | ICD-10-CM | POA: Diagnosis not present

## 2018-01-27 DIAGNOSIS — R748 Abnormal levels of other serum enzymes: Secondary | ICD-10-CM | POA: Diagnosis not present

## 2018-01-27 DIAGNOSIS — E038 Other specified hypothyroidism: Secondary | ICD-10-CM | POA: Diagnosis not present

## 2018-01-27 DIAGNOSIS — M79605 Pain in left leg: Secondary | ICD-10-CM | POA: Diagnosis not present

## 2018-01-27 DIAGNOSIS — I69959 Hemiplegia and hemiparesis following unspecified cerebrovascular disease affecting unspecified side: Secondary | ICD-10-CM | POA: Diagnosis not present

## 2018-01-27 DIAGNOSIS — R69 Illness, unspecified: Secondary | ICD-10-CM | POA: Diagnosis not present

## 2018-01-27 DIAGNOSIS — M859 Disorder of bone density and structure, unspecified: Secondary | ICD-10-CM | POA: Diagnosis not present

## 2018-01-27 DIAGNOSIS — R413 Other amnesia: Secondary | ICD-10-CM | POA: Diagnosis not present

## 2018-02-08 ENCOUNTER — Encounter: Payer: Self-pay | Admitting: Psychology

## 2018-02-08 ENCOUNTER — Encounter: Payer: Medicare HMO | Attending: Physical Medicine & Rehabilitation | Admitting: Psychology

## 2018-02-08 DIAGNOSIS — I69351 Hemiplegia and hemiparesis following cerebral infarction affecting right dominant side: Secondary | ICD-10-CM | POA: Diagnosis not present

## 2018-02-08 DIAGNOSIS — I69319 Unspecified symptoms and signs involving cognitive functions following cerebral infarction: Secondary | ICD-10-CM

## 2018-02-08 DIAGNOSIS — J9601 Acute respiratory failure with hypoxia: Secondary | ICD-10-CM | POA: Insufficient documentation

## 2018-02-08 DIAGNOSIS — G35 Multiple sclerosis: Secondary | ICD-10-CM | POA: Diagnosis not present

## 2018-02-08 DIAGNOSIS — Z8249 Family history of ischemic heart disease and other diseases of the circulatory system: Secondary | ICD-10-CM | POA: Diagnosis not present

## 2018-02-08 DIAGNOSIS — Z9889 Other specified postprocedural states: Secondary | ICD-10-CM | POA: Diagnosis not present

## 2018-02-08 DIAGNOSIS — I69391 Dysphagia following cerebral infarction: Secondary | ICD-10-CM | POA: Diagnosis not present

## 2018-02-08 DIAGNOSIS — I480 Paroxysmal atrial fibrillation: Secondary | ICD-10-CM | POA: Diagnosis not present

## 2018-02-08 DIAGNOSIS — I69311 Memory deficit following cerebral infarction: Secondary | ICD-10-CM | POA: Insufficient documentation

## 2018-02-08 DIAGNOSIS — Z809 Family history of malignant neoplasm, unspecified: Secondary | ICD-10-CM | POA: Diagnosis not present

## 2018-02-08 DIAGNOSIS — I270 Primary pulmonary hypertension: Secondary | ICD-10-CM | POA: Diagnosis not present

## 2018-02-08 DIAGNOSIS — Z955 Presence of coronary angioplasty implant and graft: Secondary | ICD-10-CM | POA: Diagnosis not present

## 2018-02-08 NOTE — Progress Notes (Signed)
Today I administered the RBANS neuropsychological test battery as well as other memory and cognitive tasks.

## 2018-02-13 ENCOUNTER — Ambulatory Visit: Payer: Medicare HMO | Admitting: Psychology

## 2018-02-13 ENCOUNTER — Ambulatory Visit: Payer: Medicare HMO | Attending: Nurse Practitioner | Admitting: Physical Therapy

## 2018-02-13 DIAGNOSIS — M79652 Pain in left thigh: Secondary | ICD-10-CM

## 2018-02-13 DIAGNOSIS — M6281 Muscle weakness (generalized): Secondary | ICD-10-CM

## 2018-02-13 DIAGNOSIS — R2681 Unsteadiness on feet: Secondary | ICD-10-CM

## 2018-02-13 DIAGNOSIS — R2689 Other abnormalities of gait and mobility: Secondary | ICD-10-CM | POA: Diagnosis not present

## 2018-02-13 NOTE — Patient Instructions (Signed)
ANKLE: Dorsiflexion - Sitting    Sitting, place strap around foot. Pull foot toward body, keeping heel on floor. Keep knee straight. Hold _30__ seconds. _3__ reps per set, __1_ sets per day, __7_ days per week   KNEE: Extension, Long Arc Quads - Sitting    Raise leg until knee is straight. Hold for 3 seconds. Then lift other leg an straighten your knees. Hold for 3 seconds. ___ 20 reps per set, __2_ sets per day, __7_ days per week    High Stepping in Place (Sitting)    Sitting, alternately lift knees as high as possible. Keep torso erect. Repeat __30__ times, alternating legs.  Copyright  VHI. All rights reserved.

## 2018-02-13 NOTE — Therapy (Signed)
[facility] [address] [place], [place], [address] Phone: [phone]   Fax:  [phone]  Physical Therapy Treatment  Patient Details  Name: Kaitlin [name] MRN: [id number] Date of Birth: 12-Feb-1946 Referring Provider: Nilda Riggs, NP   Encounter Date: 02/13/2018  PT End of Session - 02/13/18 1215    Visit Number  2    Number of Visits  17    Date for PT Re-Evaluation  04/14/18    Authorization Type  [employer] Medicare    Authorization Time Period  12/27/17 to 12/27/17; 02/13/18 to 04/14/18    PT Start Time  1112    PT Stop Time  1158    PT Time Calculation (min)  46 min    Activity Tolerance  Patient limited by pain standing exercises incr pain    Behavior During Therapy  [facility] for tasks assessed/performed       Past Medical History:  Diagnosis Date  . Abnormality of gait as late effect of stroke 02/23/2017  . Acute hypoxemic respiratory failure (HCC)   . Atrial fibrillation with RVR (HCC) 02/17/2017  . CAD S/P percutaneous coronary angioplasty 2001  . CVA (cerebral vascular accident) (HCC) 01/2017  . Dysphagia as late effect of stroke 02/23/2017  . HCVD (hypertensive cardiovascular disease) 01/2017   normal LVF with moderate LVH, grade 1 DD  . High cholesterol   . Hypokalemia   . Labile blood pressure   . Leukocytosis   . Multiple sclerosis (HCC)   . PAF (paroxysmal atrial fibrillation) (HCC) 01/2017  . Right hemiparesis [facility])     Past Surgical History:  Procedure Laterality Date  . CORONARY ANGIOPLASTY WITH STENT PLACEMENT  11/1999   RCA DES-[facility]  . IR ANGIO INTRA EXTRACRAN SEL COM CAROTID INNOMINATE UNI R MOD SED  02/15/2017  . IR ANGIO VERTEBRAL SEL SUBCLAVIAN INNOMINATE UNI R MOD SED  02/15/2017  . IR INTRAVSC STENT CERV CAROTID W/O EMB-PROT MOD SED INC ANGIO  02/15/2017  . IR PERCUTANEOUS ART THROMBECTOMY/INFUSION INTRACRANIAL INC DIAG ANGIO  02/15/2017  . IR RADIOLOGIST EVAL & MGMT  05/10/2017  .  NECK SURGERY    . RADIOLOGY WITH ANESTHESIA N/A 02/14/2017   Procedure: RADIOLOGY WITH ANESTHESIA;  Surgeon: Radiologist, Medication, MD;  Location: [facility] OR;  Service: Radiology;  Laterality: N/A;  . WRIST SURGERY      There were no vitals filed for this visit.  Subjective Assessment - 02/13/18 1112    Subjective  Is doing much better. Able to walk with RW. Is using the RW all the time. Even into the bathroom (was a problem before). Has to turn sideways which is a little harder. Has been doing 5 steps to enter with railing on her left (goes forward up and sideways going down).    Patient is accompained by:  Family member    Pertinent History  afib, CAD s/p PTCA, MS with gait disorder/memory loss, lt cca and ICA CVA 7/18  Extensive white matter disease; fall 06/2017 with [facility]     Limitations  Walking    Diagnostic tests  MRI 12/08/17 with no acute changes    Patient Stated Goals  pt unable to state; husband wants pt to be able to walk again without a device    Currently in Pain?  Yes    Pain Score  -- pt has difficulty scoring due to cognition    Pain Location  Leg    Pain Orientation  Left    Pain Descriptors /  Indicators  Discomfort    Pain Type  Acute pain    Pain Onset  More than a month ago    Pain Frequency  Intermittent    Aggravating Factors   weightbearing    Pain Relieving Factors  geting off her leg                       [facility] Adult PT Treatment/Exercise - 02/13/18 1126      Transfers   Transfers  Sit to Stand;Stand to Sit    Sit to Stand  4: Min guard;With upper extremity assist    Stand to Sit  4: Min assist    Transfer Cueing  every transfer during sessoin she required vc for correct hand placement (likes to leave hands on RW); occasional not fully turning around and assist to ensure she does not miss target and to control descent      Ambulation/Gait   Ambulation/Gait  Yes    Ambulation/Gait Assistance  4: Min guard    Ambulation Distance (Feet)  120 Feet  80, 30, 15    Assistive device  Rolling walker    Gait Pattern  Step-through pattern;Decreased stride length;Left foot flat;Right foot flat;Trunk flexed;Wide base of support;Poor foot clearance - left;Poor foot clearance - right    Gait velocity  32.8/30.38=1.08 ft/sec <1.81 ft/sec indicates high fall risk      Posture/Postural Control   Posture/Postural Control  Postural limitations    Postural Limitations  Rounded Shoulders;Forward head;Increased thoracic kyphosis;Flexed trunk      Standardized Balance Assessment   Standardized Balance Assessment  Timed Up and Go Test      Timed Up and Go Test   TUG  Normal TUG    Normal TUG (seconds)  26.25      Exercises   Exercises  Knee/Hip;Ankle      Knee/Hip Exercises: Seated   Long Arc Quad  Strengthening;Both;1 set;20 reps attempted red band with pt too weak and reported lt thigh pa    Marching  Strengthening;Both;1 set;20 reps      Ankle Exercises: Stretches   Gastroc Stretch  2 reps;30 seconds    Gastroc Stretch Limitations  seated with knee extended and belt around forefoot; pt able to set-up & complete with cies             PT Education - 02/13/18 1215    Education Details  results of re-eval, updated POC; initiated HEP    Person(s) Educated  Patient;Spouse    Methods  Explanation;Demonstration;Tactile cues;Verbal cues;Handout    Comprehension  Verbalized understanding;Returned demonstration;Verbal cues required;Tactile cues required       PT Short Term Goals - 02/13/18 1229      PT SHORT TERM GOAL #1   Title  Patient (with supervision to min-guard assistance of her spouse) will perform basic HEP for balance and strengthening bil LEs. (Target STGs 03/15/2018)    Time  4    Period  Weeks    Status  New      PT SHORT TERM GOAL #2   Title  Patient will improve gait velocity to >=1.81 Ft/sec to demonstrate decr fall risk    Time  4    Period  Weeks    Status  New      PT SHORT TERM GOAL #3   Title  Patient will  improve TUG with or without a device to <22 seconds to demonstrate a lesser fall risk.     Time  4    Period  Weeks    Status  New      PT SHORT TERM GOAL #4   Title  Patient will ambulate 400 ft consecutively with LRAD with minguard assist for safety.     Time  4    Period  Weeks    Status  New        PT Long Term Goals - 02/13/18 1233      PT LONG TERM GOAL #1   Title  Patient (with supervision of husband) will perform updated HEP (Target for all LTGs 04/14/2018)    Time  8    Period  Weeks    Status  New      PT LONG TERM GOAL #2   Title  Patient will improve gait velocity to >=2.3 ft/sec to show progress towards safe community ambulation.     Time  8    Period  Weeks    Status  New      PT LONG TERM GOAL #3   Title  Patient will decr TUG to <18 sec with LRAD     Time  8    Period  Weeks    Status  New            Plan - 02/13/18 1218    Clinical Impression Statement  Patient returns to [facility] after last seen 12/27/17. She had experienced severe "cramp" of left thigh and at that time could not walk due to thigh pain. She ultimately saw her physician and he felt it was a deep bruise. On return followup with MD, she was recently cleared to rreturn to PT. Re-evaluation completed as she was unable to complete some tasks on evaluation due to left thigh pain. Her results today indicate she remains at an increased risk of falls. Instructed pt/spouse that she should continue to use her RW at all times.     Rehab Potential  Good    Clinical Impairments Affecting Rehab Potential  lt thigh pain of unknown origin (has been cleared by MD to return); decr memory    PT Frequency  2x / week    PT Duration  8 weeks    PT Treatment/Interventions  ADLs/Self Care Home Management;Gait training;DME Instruction;Stair training;Functional mobility training;Therapeutic activities;Therapeutic exercise;Balance training;Neuromuscular re-education;Cognitive remediation;Orthotic  Fit/Training;Patient/family education    PT Next Visit Plan  limit walking and focus on standing exercises (to tolerance of Lt thigh)--?SLS, hip abdct, ?sit to stand; add to HEP approp hip exercises based on her pain     Consulted and Agree with Plan of Care  Patient;Family member/caregiver    Family Member Consulted  [name], husband       Patient will benefit from skilled therapeutic intervention in order to improve the following deficits and impairments:  Decreased activity tolerance, Decreased balance, Decreased cognition, Decreased mobility, Decreased knowledge of use of DME, Decreased endurance, Decreased range of motion, Decreased strength, Difficulty walking, Impaired flexibility, Postural dysfunction, Pain(TBA if pt cleared for PT)  Visit Diagnosis: Unsteadiness on feet - Plan: PT plan of care cert/re-cert  Other abnormalities of gait and mobility - Plan: PT plan of care cert/re-cert  Muscle weakness (generalized) - Plan: PT plan of care cert/re-cert  Pain in left thigh - Plan: PT plan of care cert/re-cert     Problem List Patient Active Problem List   Diagnosis Date Noted  . History of stroke 11/30/2017  . Cognitive deficit, post-stroke 09/30/2017  . Subarachnoid hemorrhage (HCC) 06/04/2017  . Multiple  sclerosis (HCC) 05/12/2017  . CAD (coronary artery disease) 04/12/2017  . Hypertension 04/11/2017  . Right hemiparesis (HCC)   . Aspiration pneumonia of right lung (HCC)   . Leukocytosis   . Hypokalemia   . Labile blood pressure   . Abnormality of gait as late effect of stroke 02/23/2017  . Dysarthria as late effect of stroke 02/23/2017  . Dysphagia as late effect of stroke 02/23/2017  . Acute ischemic left middle cerebral artery (MCA) stroke (HCC) 02/22/2017  . Paroxysmal A-fib (HCC) 02/17/2017  . Atrial fibrillation with RVR (HCC) 02/17/2017  . HLD (hyperlipidemia) 02/17/2017  . Acute hypoxemic respiratory failure (HCC)   . Stroke (cerebrum) (HCC) - Acute MCA infarct  due to left CCA/ICA and MCA occlusion, s/p mechanical thrombectomy and left ICA stenting in the setting of newly diagnosed afib 02/14/2017  . Atherosclerotic cardiovascular disease 11/07/2013    Zena Amos, PT 02/13/2018, 1:06 PM  [facility] [address] [place], [place], [address] Phone: [phone]   Fax:  [phone]  Name: Kelaia Inks MRN: [id number] Date of Birth: Oct 16, 1945

## 2018-02-14 ENCOUNTER — Ambulatory Visit: Payer: Medicare HMO | Admitting: Physical Therapy

## 2018-02-14 ENCOUNTER — Encounter: Payer: Self-pay | Admitting: Physical Therapy

## 2018-02-14 DIAGNOSIS — R2689 Other abnormalities of gait and mobility: Secondary | ICD-10-CM | POA: Diagnosis not present

## 2018-02-14 DIAGNOSIS — M79652 Pain in left thigh: Secondary | ICD-10-CM

## 2018-02-14 DIAGNOSIS — M6281 Muscle weakness (generalized): Secondary | ICD-10-CM

## 2018-02-14 DIAGNOSIS — R2681 Unsteadiness on feet: Secondary | ICD-10-CM | POA: Diagnosis not present

## 2018-02-14 NOTE — Therapy (Signed)
[facility] [address] [place], [place], [address] Phone: [phone]   Fax:  [phone]  Physical Therapy Treatment  Patient Details  Name: Kaitlin [name] MRN: [id number] Date of Birth: April 25, 1946 Referring Provider: Nilda Riggs, NP   Encounter Date: 02/14/2018  PT End of Session - 02/14/18 2037    Visit Number  3    Number of Visits  17    Date for PT Re-Evaluation  04/14/18    Authorization Type  [employer]na Medicare    Authorization Time Period  12/27/17 to 12/27/17; 02/13/18 to 04/14/18    PT Start Time  1445    PT Stop Time  1528    PT Time Calculation (min)  43 min    Activity Tolerance  Patient limited by pain standing exercises incr pain    Behavior During Therapy  [facility] for tasks assessed/performed       Past Medical History:  Diagnosis Date  . Abnormality of gait as late effect of stroke 02/23/2017  . Acute hypoxemic respiratory failure (HCC)   . Atrial fibrillation with RVR (HCC) 02/17/2017  . CAD S/P percutaneous coronary angioplasty 2001  . CVA (cerebral vascular accident) (HCC) 01/2017  . Dysphagia as late effect of stroke 02/23/2017  . HCVD (hypertensive cardiovascular disease) 01/2017   normal LVF with moderate LVH, grade 1 DD  . High cholesterol   . Hypokalemia   . Labile blood pressure   . Leukocytosis   . Multiple sclerosis (HCC)   . PAF (paroxysmal atrial fibrillation) (HCC) 01/2017  . Right hemiparesis [facility])     Past Surgical History:  Procedure Laterality Date  . CORONARY ANGIOPLASTY WITH STENT PLACEMENT  11/1999   RCA DES-[facility]  . IR ANGIO INTRA EXTRACRAN SEL COM CAROTID INNOMINATE UNI R MOD SED  02/15/2017  . IR ANGIO VERTEBRAL SEL SUBCLAVIAN INNOMINATE UNI R MOD SED  02/15/2017  . IR INTRAVSC STENT CERV CAROTID W/O EMB-PROT MOD SED INC ANGIO  02/15/2017  . IR PERCUTANEOUS ART THROMBECTOMY/INFUSION INTRACRANIAL INC DIAG ANGIO  02/15/2017  . IR RADIOLOGIST EVAL & MGMT  05/10/2017  .  NECK SURGERY    . RADIOLOGY WITH ANESTHESIA N/A 02/14/2017   Procedure: RADIOLOGY WITH ANESTHESIA;  Surgeon: Radiologist, Medication, MD;  Location: [facility] OR;  Service: Radiology;  Laterality: N/A;  . WRIST SURGERY      There were no vitals filed for this visit.  Subjective Assessment - 02/14/18 1448    Subjective  No questions about HEP. Did it this morning.     Patient is accompained by:  Family member    Pertinent History  afib, CAD s/p PTCA, MS with gait disorder/memory loss, lt cca and ICA CVA 7/18  Extensive white matter disease; fall 06/[facility] with [facility]     Limitations  Walking    Diagnostic tests  MRI 12/08/17 with no acute changes    Patient Stated Goals  pt unable to state; husband wants pt to be able to walk again without a device    Currently in Pain?  No/denies    Pain Onset  --                       [facility] Adult PT Treatment/Exercise - 02/14/18 1503      Ambulation/Gait   Ambulation/Gait Assistance  4: Min guard    Ambulation Distance (Feet)  100 Feet 120, 40 x 2    Assistive device  Rolling walker    Gait Pattern  Step-through pattern;Decreased stride length;Left foot flat;Right foot flat;Trunk flexed;Wide base of support;Poor foot clearance - left;Poor foot clearance - right    Ambulation Surface  Level;Indoor      Knee/Hip Exercises: Aerobic   Nustep  3 min L2 Steps per minute 30-70, for gradual warm-up. Pt denied left thigh pain throughout      Knee/Hip Exercises: Standing   Hip Abduction  Stengthening;Both;1 set;Knee straight    Abduction Limitations  only 2 reps each side with pt then reporting lt lateral thigh pain and deferred further SLS exercises    Other Standing Knee Exercises  side stepping attempted with antalgic gait noted and reported left lateral thigh pain      Knee/Hip Exercises: Seated   Sit to Sand  3 sets;10 reps;without UE support 20", 18", 16" seat heights; denied thigh pain      Knee/Hip Exercises: Supine   Bridges   Strengthening;Both;2 sets;10 reps 3 sec then 5 sec hold             PT Education - 02/14/18 2036    Education Details  addition to HEP    Person(s) Educated  Patient;Spouse    Methods  Explanation;Demonstration;Verbal cues;Handout    Comprehension  Verbalized understanding;Returned demonstration;Need further instruction;Verbal cues required       PT Short Term Goals - 02/13/18 1229      PT SHORT TERM GOAL #1   Title  Patient (with supervision to min-guard assistance of her spouse) will perform basic HEP for balance and strengthening bil LEs. (Target STGs 03/15/2018)    Time  4    Period  Weeks    Status  New      PT SHORT TERM GOAL #2   Title  Patient will improve gait velocity to >=1.81 Ft/sec to demonstrate decr fall risk    Time  4    Period  Weeks    Status  New      PT SHORT TERM GOAL #3   Title  Patient will improve TUG with or without a device to <22 seconds to demonstrate a lesser fall risk.     Time  4    Period  Weeks    Status  New      PT SHORT TERM GOAL #4   Title  Patient will ambulate 400 ft consecutively with LRAD with minguard assist for safety.     Time  4    Period  Weeks    Status  New        PT Long Term Goals - 02/13/18 1233      PT LONG TERM GOAL #1   Title  Patient (with supervision of husband) will perform updated HEP (Target for all LTGs 04/14/2018)    Time  8    Period  Weeks    Status  New      PT LONG TERM GOAL #2   Title  Patient will improve gait velocity to >=2.3 ft/sec to show progress towards safe community ambulation.     Time  8    Period  Weeks    Status  New      PT LONG TERM GOAL #3   Title  Patient will decr TUG to <18 sec with LRAD     Time  8    Period  Weeks    Status  New            Plan - 02/14/18 2038    Clinical Impression Statement  Patient with  no increased left lateral thigh pain after yesterday's session (in fact, felt well enough to do her HEP this morning!). Attempted standing exercises after  short warm-up on Nustep with patient again reporting increased lateral thigh pain and deferred any further single leg stance exercises. She tolerated sit to stand and bridging without increased pain. Repeated sit to stand and stand to sit ~40 times throughout session with focus on safe/correct use of RW, however even with questioning cues, she is unable to recall to push off and reach for the surface she is sitting upon. She was able to correct her position relative to RW while walking with instructional cues progressing to questioning cues. Will continue to benefit from PT (with husband's support for HEP and carryover of proper use of RW).    Rehab Potential  Good    Clinical Impairments Affecting Rehab Potential  lt thigh pain of unknown origin (has been cleared by MD to return); decr memory    PT Frequency  2x / week    PT Duration  8 weeks    PT Treatment/Interventions  ADLs/Self Care Home Management;Gait training;DME Instruction;Stair training;Functional mobility training;Therapeutic activities;Therapeutic exercise;Balance training;Neuromuscular re-education;Cognitive remediation;Orthotic Fit/Training;Patient/family education    PT Next Visit Plan  *Note pt missed weeks of PT due to left lateral thigh pain of unknown cause--MD cleared return, increased pain when working hip abductors in standing so deferred; consistently practice proper use of RW during transfers and gait; gait training for improved rt foot clearance; add to HEP approp hip exercises based on her pain (?clamshell)    Consulted and Agree with Plan of Care  Patient;Family member/caregiver    Family Member Consulted  [name], husband       Patient will benefit from skilled therapeutic intervention in order to improve the following deficits and impairments:  Decreased activity tolerance, Decreased balance, Decreased cognition, Decreased mobility, Decreased knowledge of use of DME, Decreased endurance, Decreased range of motion, Decreased  strength, Difficulty walking, Impaired flexibility, Postural dysfunction, Pain(TBA if pt cleared for PT)  Visit Diagnosis: Other abnormalities of gait and mobility  Muscle weakness (generalized)  Pain in left thigh     Problem List Patient Active Problem List   Diagnosis Date Noted  . History of stroke 11/30/2017  . Cognitive deficit, post-stroke 09/30/2017  . Subarachnoid hemorrhage (HCC) 06/04/2017  . Multiple sclerosis (HCC) 05/12/2017  . CAD (coronary artery disease) 04/12/2017  . Hypertension 04/11/2017  . Right hemiparesis (HCC)   . Aspiration pneumonia of right lung (HCC)   . Leukocytosis   . Hypokalemia   . Labile blood pressure   . Abnormality of gait as late effect of stroke 02/23/2017  . Dysarthria as late effect of stroke 02/23/2017  . Dysphagia as late effect of stroke 02/23/2017  . Acute ischemic left middle cerebral artery (MCA) stroke (HCC) 02/22/2017  . Paroxysmal A-fib (HCC) 02/17/2017  . Atrial fibrillation with RVR (HCC) 02/17/2017  . HLD (hyperlipidemia) 02/17/2017  . Acute hypoxemic respiratory failure (HCC)   . Stroke (cerebrum) (HCC) - Acute MCA infarct due to left CCA/ICA and MCA occlusion, s/p mechanical thrombectomy and left ICA stenting in the setting of newly diagnosed afib 02/14/2017  . Atherosclerotic cardiovascular disease 11/07/2013    Zena Amos , PT 02/14/2018, 8:48 PM  [facility] [address] [place], [place], [address] Phone: [phone]   Fax:  [phone]  Name: Classie Weng MRN: [id number] Date of Birth: 1946-02-10

## 2018-02-14 NOTE — Patient Instructions (Signed)
Bridge    Lie back, legs bent. Feet and knees apart. Tighten your tummy and lift your hips up toward the ceiling. Hold for 5 counts. lRepeat __10__ times. Do __2__ sessions per day.    HIP / KNEE: Extension - Sit to Stand    Sitting, cross arms across your chest, lean chest forward, raise hips up from surface. Straighten hips and knees. Weight bear equally on left and right sides. Backs of legs should not push off surface. Then SLOWLY lower back down to sitting. Your shoulders should stay forward as you bend your hips and knees.  _10__ reps per set, __2_ sets per day

## 2018-02-15 DIAGNOSIS — I69351 Hemiplegia and hemiparesis following cerebral infarction affecting right dominant side: Secondary | ICD-10-CM | POA: Diagnosis not present

## 2018-02-15 DIAGNOSIS — G35 Multiple sclerosis: Secondary | ICD-10-CM | POA: Diagnosis not present

## 2018-02-15 DIAGNOSIS — I639 Cerebral infarction, unspecified: Secondary | ICD-10-CM | POA: Diagnosis not present

## 2018-02-15 DIAGNOSIS — R2689 Other abnormalities of gait and mobility: Secondary | ICD-10-CM | POA: Diagnosis not present

## 2018-02-17 DIAGNOSIS — R748 Abnormal levels of other serum enzymes: Secondary | ICD-10-CM | POA: Diagnosis not present

## 2018-02-20 ENCOUNTER — Ambulatory Visit: Payer: Medicare HMO | Admitting: Psychology

## 2018-02-21 DIAGNOSIS — M7090 Unspecified soft tissue disorder related to use, overuse and pressure of unspecified site: Secondary | ICD-10-CM | POA: Diagnosis not present

## 2018-02-21 DIAGNOSIS — R748 Abnormal levels of other serum enzymes: Secondary | ICD-10-CM | POA: Diagnosis not present

## 2018-02-22 ENCOUNTER — Other Ambulatory Visit: Payer: Self-pay | Admitting: Family Medicine

## 2018-02-22 DIAGNOSIS — R748 Abnormal levels of other serum enzymes: Secondary | ICD-10-CM

## 2018-02-23 ENCOUNTER — Encounter: Payer: Self-pay | Admitting: Physical Therapy

## 2018-02-23 ENCOUNTER — Ambulatory Visit: Payer: Medicare HMO | Admitting: Physical Therapy

## 2018-02-23 DIAGNOSIS — R2681 Unsteadiness on feet: Secondary | ICD-10-CM | POA: Diagnosis not present

## 2018-02-23 DIAGNOSIS — M6281 Muscle weakness (generalized): Secondary | ICD-10-CM | POA: Diagnosis not present

## 2018-02-23 DIAGNOSIS — M79652 Pain in left thigh: Secondary | ICD-10-CM

## 2018-02-23 DIAGNOSIS — R2689 Other abnormalities of gait and mobility: Secondary | ICD-10-CM | POA: Diagnosis not present

## 2018-02-23 NOTE — Therapy (Addendum)
[facility] [address] [place], [place], [address] Phone: [phone]   Fax:  [phone]  Physical Therapy Treatment  Patient Details  Name: Kaitlin [name] MRN: [id number] Date of Birth: [birth year] Referring Provider: Nilda Riggs, NP   Encounter Date: 02/23/2018  PT End of Session - 02/23/18 1028    Visit Number  4    Number of Visits  17    Date for PT Re-Evaluation  04/14/18    Authorization Type  [employer] Medicare    Authorization Time Period  12/27/17 to 12/27/17; 02/13/18 to 04/14/18    PT Start Time  1020    PT Stop Time  1102    PT Time Calculation (min)  42 min    Activity Tolerance  Patient limited by pain;No increased pain;Patient tolerated treatment well standing exercises incr pain    Behavior During Therapy  WFL for tasks assessed/performed       Past Medical History:  Diagnosis Date  . Abnormality of gait as late effect of stroke 02/23/2017  . Acute hypoxemic respiratory failure (HCC)   . Atrial fibrillation with RVR (HCC) 02/17/2017  . CAD S/P percutaneous coronary angioplasty 2001  . CVA (cerebral vascular accident) (HCC) 01/2017  . Dysphagia as late effect of stroke 02/23/2017  . HCVD (hypertensive cardiovascular disease) 01/2017   normal LVF with moderate LVH, grade 1 DD  . High cholesterol   . Hypokalemia   . Labile blood pressure   . Leukocytosis   . Multiple sclerosis (HCC)   . PAF (paroxysmal atrial fibrillation) (HCC) 01/2017  . Right hemiparesis [facility])     Past Surgical History:  Procedure Laterality Date  . CORONARY ANGIOPLASTY WITH STENT PLACEMENT  11/1999   RCA DES-[facility]  . IR ANGIO INTRA EXTRACRAN SEL COM CAROTID INNOMINATE UNI R MOD SED  02/15/2017  . IR ANGIO VERTEBRAL SEL SUBCLAVIAN INNOMINATE UNI R MOD SED  02/15/2017  . IR INTRAVSC STENT CERV CAROTID W/O EMB-PROT MOD SED INC ANGIO  02/15/2017  . IR PERCUTANEOUS ART THROMBECTOMY/INFUSION INTRACRANIAL INC DIAG ANGIO   02/15/2017  . IR RADIOLOGIST EVAL & MGMT  05/10/2017  . NECK SURGERY    . RADIOLOGY WITH ANESTHESIA N/A 02/14/2017   Procedure: RADIOLOGY WITH ANESTHESIA;  Surgeon: Radiologist, Medication, MD;  Location: [facility] OR;  Service: Radiology;  Laterality: N/A;  . WRIST SURGERY      There were no vitals filed for this visit.  Subjective Assessment - 02/23/18 1023    Subjective  Spouse reports that Kaitlin [name] now has a new pulled muscle from her last session and pointed to pt's IT band on the left. When pt is asked where she is sore she also rubs her IT band. Denies any soreness in her thigh (quad) when asked if it hurts there. No falls. Saw the PA this week at her primary MD office who suggested she take Advil to help with muscle soreness.     Patient is accompained by:  Family member spouse    Pertinent History  afib, CAD s/p PTCA, MS with gait disorder/memory loss, lt cca and ICA CVA 7/18  Extensive white matter disease; fall 06/2017 with [facility]     Limitations  Walking    How long can you sit comfortably?  unlimited    How long can you stand comfortably?  <1 minute    How long can you walk comfortably?  none    Diagnostic tests  MRI 12/08/17 with no acute changes  Patient Stated Goals  pt unable to state; husband wants pt to be able to walk again without a device    Currently in Pain?  Yes    Pain Score  5  pt reports midway pain, not emergency room pain with cues on scale    Pain Location  Leg    Pain Orientation  Left    Pain Descriptors / Indicators  Aching;Tightness;Discomfort    Pain Onset  In the past 7 days    Pain Frequency  Intermittent    Aggravating Factors   weightbearing    Pain Relieving Factors  getting off her leg          [facility] Adult PT Treatment/Exercise - 02/23/18 1054      Transfers   Transfers  Sit to Stand;Stand to Sit    Sit to Stand  4: Min guard;With upper extremity assist;From bed;From chair/3-in-1    Sit to Stand Details  Verbal cues for sequencing;Verbal cues for safe  use of DME/AE    Stand to Sit  4: Min guard;With upper extremity assist;To bed;To chair/3-in-1    Stand to Sit Details (indicate cue type and reason)  Verbal cues for technique;Verbal cues for safe use of DME/AE      Ambulation/Gait   Ambulation/Gait  Yes    Ambulation/Gait Assistance  5: Supervision    Ambulation/Gait Assistance Details  cues on posture, walker position with gait and step length with gait    Ambulation Distance (Feet)  230 Feet x1, plus ~100 ft x2 in/out of gym    Assistive device  Rolling walker    Gait Pattern  Step-through pattern;Decreased stride length;Left foot flat;Right foot flat;Trunk flexed;Wide base of support;Poor foot clearance - left;Poor foot clearance - right    Ambulation Surface  Level;Indoor      Knee/Hip Exercises: Supine   Bridges Limitations  with yoga block between knees to promote alignment    Bridges with [employer]  AROM;Strengthening;Both;1 set;10 reps;Limitations    Other Supine Knee/Hip Exercises  hip fall outs with red band resistance x 10 reps each side, cues on controlled movements. no incr in pain reported.       Knee/Hip Exercises: Sidelying   Clams  in right side lying. left clam shell x 1o reps in limited range of motion due to pain. assist needed to maintain correct posture and body alingment.     Other Sidelying Knee/Hip Exercises  in right sidelying: left LE hip abduction with assistance for form x 10 reps                                   Manual Therapy   Manual Therapy  Soft tissue mobilization    Manual therapy comments  all manual therapy was for decreased muscle tightness. performed manual massage/soft tissue mobs, followed by foam roller to IT band on left LE for decreased tightness and pain. pt wtih reports of some pain improvement with manual therapy.     Soft tissue mobilization  IT band and quad muscle          PT Short Term Goals - 02/13/18 1229      PT SHORT TERM GOAL #1   Title  Patient (with supervision to  min-guard assistance of her spouse) will perform basic HEP for balance and strengthening bil LEs. (Target STGs 03/15/2018)    Time  4    Period  Weeks  Status  New      PT SHORT TERM GOAL #2   Title  Patient will improve gait velocity to >=1.81 Ft/sec to demonstrate decr fall risk    Time  4    Period  Weeks    Status  New      PT SHORT TERM GOAL #3   Title  Patient will improve TUG with or without a device to <22 seconds to demonstrate a lesser fall risk.     Time  4    Period  Weeks    Status  New      PT SHORT TERM GOAL #4   Title  Patient will ambulate 400 ft consecutively with LRAD with minguard assist for safety.     Time  4    Period  Weeks    Status  New        PT Long Term Goals - 02/13/18 1233      PT LONG TERM GOAL #1   Title  Patient (with supervision of husband) will perform updated HEP (Target for all LTGs 04/14/2018)    Time  8    Period  Weeks    Status  New      PT LONG TERM GOAL #2   Title  Patient will improve gait velocity to >=2.3 ft/sec to show progress towards safe community ambulation.     Time  8    Period  Weeks    Status  New      PT LONG TERM GOAL #3   Title  Patient will decr TUG to <18 sec with LRAD     Time  8    Period  Weeks    Status  New            Plan - 02/23/18 1029    Clinical Impression Statement  Today's skilled session continued address LE pain/strengthening. Manual therapy was approved by primary PT and performed this session for decreased muscle pain/tightness. Pt reported no change after manual therapy. She did report decreaed pain and tightness as end of session after exercises, stretches and gait with RW. Pt is progressing toward goals and should benefit from continued PT to progress toward unmet goals.     Rehab Potential  Good    Clinical Impairments Affecting Rehab Potential  lt thigh pain of unknown origin (has been cleared by MD to return); decr memory    PT Frequency  2x / week    PT Duration  8 weeks     PT Treatment/Interventions  ADLs/Self Care Home Management;Gait training;DME Instruction;Stair training;Functional mobility training;Therapeutic activities;Therapeutic exercise;Balance training;Neuromuscular re-education;Cognitive remediation;Orthotic Fit/Training;Patient/family education   Addendum: addition of manual therapy (LPS)    PT Next Visit Plan  *Note pt missed weeks of PT due to left lateral thigh pain of unknown cause--MD cleared return, increased pain when working hip abductors in standing so deferred; consistently practice proper use of RW during transfers and gait; gait training for improved rt foot clearance; add to HEP approp hip exercises based on her pain (?clamshell)    Consulted and Agree with Plan of Care  Patient;Family member/caregiver    Family Member Consulted  [name], husband       Patient will benefit from skilled therapeutic intervention in order to improve the following deficits and impairments:  Decreased activity tolerance, Decreased balance, Decreased cognition, Decreased mobility, Decreased knowledge of use of DME, Decreased endurance, Decreased range of motion, Decreased strength, Difficulty walking, Impaired flexibility, Postural dysfunction, Pain(TBA if pt  cleared for PT)  Visit Diagnosis: Other abnormalities of gait and mobility  Muscle weakness (generalized)  Pain in left thigh     Problem List Patient Active Problem List   Diagnosis Date Noted  . History of stroke 11/30/2017  . Cognitive deficit, post-stroke 09/30/2017  . Subarachnoid hemorrhage (HCC) 06/04/2017  . Multiple sclerosis (HCC) 05/12/2017  . CAD (coronary artery disease) 04/12/2017  . Hypertension 04/11/2017  . Right hemiparesis (HCC)   . Aspiration pneumonia of right lung (HCC)   . Leukocytosis   . Hypokalemia   . Labile blood pressure   . Abnormality of gait as late effect of stroke 02/23/2017  . Dysarthria as late effect of stroke 02/23/2017  . Dysphagia as late effect of  stroke 02/23/2017  . Acute ischemic left middle cerebral artery (MCA) stroke (HCC) 02/22/2017  . Paroxysmal A-fib (HCC) 02/17/2017  . Atrial fibrillation with RVR (HCC) 02/17/2017  . HLD (hyperlipidemia) 02/17/2017  . Acute hypoxemic respiratory failure (HCC)   . Stroke (cerebrum) (HCC) - Acute MCA infarct due to left CCA/ICA and MCA occlusion, s/p mechanical thrombectomy and left ICA stenting in the setting of newly diagnosed afib 02/14/2017  . Atherosclerotic cardiovascular disease 11/07/2013   Sallyanne Kuster, PTA, [facility] [address] [place], [place] [address] [phone] 02/24/18, 12:21 AM   Name: Kaitlin [name] MRN: [id number] Date of Birth: 1946-05-26

## 2018-02-24 ENCOUNTER — Encounter: Payer: Self-pay | Admitting: Physical Therapy

## 2018-02-24 ENCOUNTER — Ambulatory Visit: Payer: Medicare HMO | Admitting: Physical Therapy

## 2018-02-24 ENCOUNTER — Ambulatory Visit: Payer: Medicare HMO | Admitting: Psychology

## 2018-02-24 DIAGNOSIS — M6281 Muscle weakness (generalized): Secondary | ICD-10-CM | POA: Diagnosis not present

## 2018-02-24 DIAGNOSIS — R2689 Other abnormalities of gait and mobility: Secondary | ICD-10-CM | POA: Diagnosis not present

## 2018-02-24 DIAGNOSIS — R2681 Unsteadiness on feet: Secondary | ICD-10-CM | POA: Diagnosis not present

## 2018-02-24 DIAGNOSIS — M1612 Unilateral primary osteoarthritis, left hip: Secondary | ICD-10-CM | POA: Diagnosis not present

## 2018-02-24 DIAGNOSIS — M79652 Pain in left thigh: Secondary | ICD-10-CM

## 2018-02-24 NOTE — Addendum Note (Signed)
Addended by: Zena Amos on: 02/24/2018 08:43 AM   Modules accepted: Orders

## 2018-02-24 NOTE — Patient Instructions (Addendum)
  NOTE: Did NOT issue this exercise due to patient's increased left lateral thigh pain at end of session. Instead, recommended pt not do any of the issued HEP until she sees the doctor.     Prior to this exercise and stretch, use your heating pad over the outside of your left thigh for up to 5 minutes.  Clam Shell 45 Degrees    Lying on the sofa on your right side with your back up against the back of the sofa. Bend your hips and knees with one pillow between knees and ankles. Keeping your feet together, lift the left knee toward the ceiling. Be sure pelvis does not roll backward. Do __10_ times, rest, and do 10 more__ times. Do this twice per day.

## 2018-02-25 NOTE — Therapy (Signed)
[facility] [address] [place], [place], [address] Phone: [phone]   Fax:  [phone]  Physical Therapy Treatment  Patient Details  Name: Kaitlin [name] MRN: [id number] Date of Birth: 05-May-1946 Referring Provider: Nilda Riggs, NP   Encounter Date: 02/24/2018  PT End of Session - 02/25/18 0645    Visit Number  5    Number of Visits  17    Date for PT Re-Evaluation  04/14/18    Authorization Type  [employer]na Medicare    Authorization Time Period  12/27/17 to 12/27/17; 02/13/18 to 04/14/18    PT Start Time  0932    PT Stop Time  1017    PT Time Calculation (min)  45 min    Activity Tolerance  Patient limited by pain standing exercises incr pain    Behavior During Therapy  [facility] for tasks assessed/performed       Past Medical History:  Diagnosis Date  . Abnormality of gait as late effect of stroke 02/23/2017  . Acute hypoxemic respiratory failure (HCC)   . Atrial fibrillation with RVR (HCC) 02/17/2017  . CAD S/P percutaneous coronary angioplasty 2001  . CVA (cerebral vascular accident) (HCC) 01/2017  . Dysphagia as late effect of stroke 02/23/2017  . HCVD (hypertensive cardiovascular disease) 01/2017   normal LVF with moderate LVH, grade 1 DD  . High cholesterol   . Hypokalemia   . Labile blood pressure   . Leukocytosis   . Multiple sclerosis (HCC)   . PAF (paroxysmal atrial fibrillation) (HCC) 01/2017  . Right hemiparesis [facility])     Past Surgical History:  Procedure Laterality Date  . CORONARY ANGIOPLASTY WITH STENT PLACEMENT  11/1999   RCA DES-[facility]  . IR ANGIO INTRA EXTRACRAN SEL COM CAROTID INNOMINATE UNI R MOD SED  02/15/2017  . IR ANGIO VERTEBRAL SEL SUBCLAVIAN INNOMINATE UNI R MOD SED  02/15/2017  . IR INTRAVSC STENT CERV CAROTID W/O EMB-PROT MOD SED INC ANGIO  02/15/2017  . IR PERCUTANEOUS ART THROMBECTOMY/INFUSION INTRACRANIAL INC DIAG ANGIO  02/15/2017  . IR RADIOLOGIST EVAL & MGMT  05/10/2017  .  NECK SURGERY    . RADIOLOGY WITH ANESTHESIA N/A 02/14/2017   Procedure: RADIOLOGY WITH ANESTHESIA;  Surgeon: Radiologist, Medication, MD;  Location: [facility] OR;  Service: Radiology;  Laterality: N/A;  . WRIST SURGERY      There were no vitals filed for this visit.  Subjective Assessment - 02/24/18 0936    Subjective  Hsuband reports she has been complaining of leg less since yesterday.     Patient is accompained by:  Family member spouse    Pertinent History  afib, CAD s/p PTCA, MS with gait disorder/memory loss, lt cca and ICA CVA 7/18  Extensive white matter disease; fall 06/2017 with [facility]     Limitations  Walking    How long can you sit comfortably?  unlimited    How long can you stand comfortably?  <1 minute    How long can you walk comfortably?  none    Diagnostic tests  MRI 12/08/17 with no acute changes    Patient Stated Goals  pt unable to state; husband wants pt to be able to walk again without a device    Currently in Pain?  Yes    Pain Score  4     Pain Location  Leg    Pain Orientation  Left    Pain Descriptors / Indicators  Discomfort    Pain Type  Acute  pain    Pain Onset  More than a month ago    Pain Frequency  Intermittent    Aggravating Factors   weightbearing     Pain Relieving Factors  getting off her leg                       [facility] Adult PT Treatment/Exercise - 02/24/18 1700      Bed Mobility   Bed Mobility  Rolling Right;Right Sidelying to Sit;Sit to Supine    Rolling Right  Supervision/verbal cueing    Right Sidelying to Sit  Contact Guard/Touching assist    Sit to Supine  Supervision/Verbal cueing      Transfers   Transfers  Sit to Stand;Stand to Sit    Sit to Stand  4: Min guard;With upper extremity assist;From bed;From chair/3-in-1    Sit to Stand Details  Verbal cues for sequencing;Verbal cues for safe use of DME/AE    Stand to Sit  4: Min guard;With upper extremity assist;To bed;To chair/3-in-1    Stand to Sit Details (indicate cue type  and reason)  Verbal cues for technique;Verbal cues for safe use of DME/AE      Ambulation/Gait   Ambulation/Gait  Yes    Ambulation/Gait Assistance  5: Supervision    Ambulation/Gait Assistance Details  cues on posture, walker position with gait and bil heelstrike for incrfoot clearance    Ambulation Distance (Feet)  100 Feet 100 ft x2 in/out of gym    Assistive device  Rolling walker    Gait Pattern  Step-through pattern;Decreased stride length;Left foot flat;Right foot flat;Trunk flexed;Wide base of support;Poor foot clearance - left;Poor foot clearance - right;Antalgic    Ambulation Surface  Level;Indoor      Knee/Hip Exercises: Aerobic   Nustep  3 minutes L1 using 4 extremities      Knee/Hip Exercises: Supine   Bridges Limitations  --    Bridges with [employer]  --    Heel Prop for Knee Extension  -- 30 seconds; -15 degrees left knee extension    Other Supine Knee/Hip Exercises  hip fall outs with no resistance with immediate pain    Other Supine Knee/Hip Exercises  PROM hip abduction x 1; PROM hip adduction (to stretch IT band) x 30 seconds      Knee/Hip Exercises: Sidelying   Clams  in right side lying. left clam shell x 1o reps AAROM and assist to maintain body alignment    Other Sidelying Knee/Hip Exercises  in right sidelying: PROM left LE hip flexion, knee extended, and hip adduction for IT band stretch               Manual Therapy   Manual Therapy  Soft tissue mobilization;Myofascial release;Passive ROM    Manual therapy comments  all manual therapy was for decreased muscle/fascia tightness of left IT band    Soft tissue mobilization  IT band and left hip abductors    Myofascial Release  left IT band    Passive ROM  see above ther-ex section for passive stretches             PT Education - 02/25/18 0643    Education Details  concern for continued "point tenderness" just distal to greater trochanter and recommend return to MD with hopeful xray ordered to rule out  fracture (provided husband with written note to MD explaining this and requesting xray)    Person(s) Educated  Patient;Spouse    Methods  Explanation;Handout    Comprehension  Verbalized understanding       PT Short Term Goals - 02/13/18 1229      PT SHORT TERM GOAL #1   Title  Patient (with supervision to min-guard assistance of her spouse) will perform basic HEP for balance and strengthening bil LEs. (Target STGs 03/15/2018)    Time  4    Period  Weeks    Status  New      PT SHORT TERM GOAL #2   Title  Patient will improve gait velocity to >=1.81 Ft/sec to demonstrate decr fall risk    Time  4    Period  Weeks    Status  New      PT SHORT TERM GOAL #3   Title  Patient will improve TUG with or without a device to <22 seconds to demonstrate a lesser fall risk.     Time  4    Period  Weeks    Status  New      PT SHORT TERM GOAL #4   Title  Patient will ambulate 400 ft consecutively with LRAD with minguard assist for safety.     Time  4    Period  Weeks    Status  New        PT Long Term Goals - 02/13/18 1233      PT LONG TERM GOAL #1   Title  Patient (with supervision of husband) will perform updated HEP (Target for all LTGs 04/14/2018)    Time  8    Period  Weeks    Status  New      PT LONG TERM GOAL #2   Title  Patient will improve gait velocity to >=2.3 ft/sec to show progress towards safe community ambulation.     Time  8    Period  Weeks    Status  New      PT LONG TERM GOAL #3   Title  Patient will decr TUG to <18 sec with LRAD     Time  8    Period  Weeks    Status  New            Plan - 02/25/18 1610    Clinical Impression Statement  On arrival, husband reported patient's left thigh pain seemed to be better after yesterday's PT session that included addition of manual therapy to decrease muscle pain/tightness. Continued gait training (vc for upright posture, proximity to RW, bil heelstrike to improve bil foot clearance), utilized Nustep for  non-weightbearing warm-up of leg muscles, manual therapy to left thigh to reduce pain/tightness in lateral thigh, and exercises for LLE. During myofascial release and massage over left IT band, pt consitently reporting most tender directly inferior to greater trochanter. With slow passive stretching/PROM in supine after manual therapy, patient "jumped" with sharp pain when LLE abducted ~15 degrees. She again pointed to area just distal to greater trochanter. Session terminated at this point due to concern for ? fracture as patient has never had an xray of left hip since the onset of the severe pain back in May (see prior notes). Husband in agreement to make a follow-up appointment with MD and reports that MD did not feel an xray was needed but would order an xray if pain persisted. Patient/husband educated to stop doing HEP and not to return to PT until she is seen again by the MD.     Rehab Potential  Good    Clinical Impairments  Affecting Rehab Potential  lt thigh pain of unknown origin (has been cleared by MD to return); decr memory    PT Frequency  2x / week    PT Duration  8 weeks    PT Treatment/Interventions  ADLs/Self Care Home Management;Gait training;DME Instruction;Stair training;Functional mobility training;Therapeutic activities;Therapeutic exercise;Balance training;Neuromuscular re-education;Cognitive remediation;Orthotic Fit/Training;Patient/family education;Passive range of motion;Manual techniques;Dry needling;Moist Heat;Ultrasound;Taping Plan updated to further address left thigh pain; recertification sent to reflect changes    PT Next Visit Plan  Did MD clear for return to PT and did they do an xray??    Consulted and Agree with Plan of Care  Patient;Family member/caregiver    Family Member Consulted  [name], husband       Patient will benefit from skilled therapeutic intervention in order to improve the following deficits and impairments:  Decreased activity tolerance, Decreased balance,  Decreased cognition, Decreased mobility, Decreased knowledge of use of DME, Decreased endurance, Decreased range of motion, Decreased strength, Difficulty walking, Impaired flexibility, Postural dysfunction, Pain(TBA if pt cleared for PT)  Visit Diagnosis: Other abnormalities of gait and mobility  Muscle weakness (generalized)  Pain in left thigh     Problem List Patient Active Problem List   Diagnosis Date Noted  . History of stroke 11/30/2017  . Cognitive deficit, post-stroke 09/30/2017  . Subarachnoid hemorrhage (HCC) 06/04/2017  . Multiple sclerosis (HCC) 05/12/2017  . CAD (coronary artery disease) 04/12/2017  . Hypertension 04/11/2017  . Right hemiparesis (HCC)   . Aspiration pneumonia of right lung (HCC)   . Leukocytosis   . Hypokalemia   . Labile blood pressure   . Abnormality of gait as late effect of stroke 02/23/2017  . Dysarthria as late effect of stroke 02/23/2017  . Dysphagia as late effect of stroke 02/23/2017  . Acute ischemic left middle cerebral artery (MCA) stroke (HCC) 02/22/2017  . Paroxysmal A-fib (HCC) 02/17/2017  . Atrial fibrillation with RVR (HCC) 02/17/2017  . HLD (hyperlipidemia) 02/17/2017  . Acute hypoxemic respiratory failure (HCC)   . Stroke (cerebrum) (HCC) - Acute MCA infarct due to left CCA/ICA and MCA occlusion, s/p mechanical thrombectomy and left ICA stenting in the setting of newly diagnosed afib 02/14/2017  . Atherosclerotic cardiovascular disease 11/07/2013    Zena Amos, PT 02/25/2018, 7:42 AM  [facility] [address] [place], [place], [address] Phone: [phone]   Fax:  [phone]  Name: Kaitlin [name] MRN: [id number] Date of Birth: May 31, 1946

## 2018-03-01 ENCOUNTER — Encounter: Payer: Self-pay | Admitting: Physical Therapy

## 2018-03-01 ENCOUNTER — Ambulatory Visit: Payer: Medicare HMO | Admitting: Physical Therapy

## 2018-03-01 DIAGNOSIS — M79652 Pain in left thigh: Secondary | ICD-10-CM | POA: Diagnosis not present

## 2018-03-01 DIAGNOSIS — R2681 Unsteadiness on feet: Secondary | ICD-10-CM | POA: Diagnosis not present

## 2018-03-01 DIAGNOSIS — R2689 Other abnormalities of gait and mobility: Secondary | ICD-10-CM | POA: Diagnosis not present

## 2018-03-01 DIAGNOSIS — M6281 Muscle weakness (generalized): Secondary | ICD-10-CM | POA: Diagnosis not present

## 2018-03-01 NOTE — Therapy (Signed)
[facility] [address] [place], [place], [address] Phone: [phone]   Fax:  [phone]  Physical Therapy Treatment  Patient Details  Name: Kaitlin [name] MRN: [id number] Date of Birth: 10-21-1945 Referring Provider: Nilda Riggs, NP   Encounter Date: 03/01/2018  PT End of Session - 03/01/18 1244    Visit Number  6    Number of Visits  17    Date for PT Re-Evaluation  04/14/18    Authorization Type  [employer] Medicare    Authorization Time Period  12/27/17 to 12/27/17; 02/13/18 to 04/14/18    PT Start Time  1147    PT Stop Time  1230    PT Time Calculation (min)  43 min    Activity Tolerance  Patient limited by pain standing exercises incr pain    Behavior During Therapy  [facility] for tasks assessed/performed       Past Medical History:  Diagnosis Date  . Abnormality of gait as late effect of stroke 02/23/2017  . Acute hypoxemic respiratory failure (HCC)   . Atrial fibrillation with RVR (HCC) 02/17/2017  . CAD S/P percutaneous coronary angioplasty 2001  . CVA (cerebral vascular accident) (HCC) 01/2017  . Dysphagia as late effect of stroke 02/23/2017  . HCVD (hypertensive cardiovascular disease) 01/2017   normal LVF with moderate LVH, grade 1 DD  . High cholesterol   . Hypokalemia   . Labile blood pressure   . Leukocytosis   . Multiple sclerosis (HCC)   . PAF (paroxysmal atrial fibrillation) (HCC) 01/2017  . Right hemiparesis [facility])     Past Surgical History:  Procedure Laterality Date  . CORONARY ANGIOPLASTY WITH STENT PLACEMENT  11/1999   RCA DES-[facility]  . IR ANGIO INTRA EXTRACRAN SEL COM CAROTID INNOMINATE UNI R MOD SED  02/15/2017  . IR ANGIO VERTEBRAL SEL SUBCLAVIAN INNOMINATE UNI R MOD SED  02/15/2017  . IR INTRAVSC STENT CERV CAROTID W/O EMB-PROT MOD SED INC ANGIO  02/15/2017  . IR PERCUTANEOUS ART THROMBECTOMY/INFUSION INTRACRANIAL INC DIAG ANGIO  02/15/2017  . IR RADIOLOGIST EVAL & MGMT  05/10/2017  .  NECK SURGERY    . RADIOLOGY WITH ANESTHESIA N/A 02/14/2017   Procedure: RADIOLOGY WITH ANESTHESIA;  Surgeon: Radiologist, Medication, MD;  Location: [facility] OR;  Service: Radiology;  Laterality: N/A;  . WRIST SURGERY      There were no vitals filed for this visit.  Subjective Assessment - 03/01/18 1151    Subjective  Pt followed up with MD and had an x-ray; there is no fracture, there is arthritis. MD stated that it is fine for pt to continue PT.  Pt took Advil before coming to therapy today.  Pt did not have pain walking with the walker today.    Patient is accompained by:  Family member spouse    Pertinent History  afib, CAD s/p PTCA, MS with gait disorder/memory loss, lt cca and ICA CVA 7/18  Extensive white matter disease; fall 06/2017 with [facility]     Limitations  Walking    How long can you sit comfortably?  unlimited    How long can you stand comfortably?  <1 minute    How long can you walk comfortably?  none    Diagnostic tests  MRI 12/08/17 with no acute changes    Patient Stated Goals  pt unable to state; husband wants pt to be able to walk again without a device    Currently in Pain?  Yes  Pain Score  4     Pain Location  Leg    Pain Orientation  Left    Pain Descriptors / Indicators  Discomfort    Pain Type  Acute pain    Pain Onset  More than a month ago                       [facility] Adult PT Treatment/Exercise - 03/01/18 0001      Ambulation/Gait   Ambulation/Gait  Yes    Ambulation/Gait Assistance  5: Supervision;4: Min assist supervsion with RW and HHA without.    Ambulation/Gait Assistance Details  cues for posture and for Right step length    Ambulation Distance (Feet)  230 Feet with walker + figure 8 HHA    Assistive device  Rolling walker    Gait Pattern  Step-through pattern;Decreased stride length;Left foot flat;Right foot flat;Trunk flexed;Wide base of support;Poor foot clearance - left;Poor foot clearance - right;Antalgic    Ambulation Surface   Level;Indoor      Knee/Hip Exercises: Aerobic   Nustep  8 minutes L1 using 4 extremities          Balance Exercises - 03/01/18 1226      Balance Exercises: Standing   Standing, One Foot on a Step  Eyes open;4 inch    Stepping Strategy  Lateral Min A    Marching Limitations  in place; HHA Min A        PT Education - 03/01/18 1243    Education provided  Yes    Education Details  HEP verbally reviewed and encourage pt's husband in guiding her through exercise with fewer reps  Building up tolerance.    Person(s) Educated  Patient    Methods  Explanation;Handout    Comprehension  Verbalized understanding       PT Short Term Goals - 02/13/18 1229      PT SHORT TERM GOAL #1   Title  Patient (with supervision to min-guard assistance of her spouse) will perform basic HEP for balance and strengthening bil LEs. (Target STGs 03/15/2018)    Time  4    Period  Weeks    Status  New      PT SHORT TERM GOAL #2   Title  Patient will improve gait velocity to >=1.81 Ft/sec to demonstrate decr fall risk    Time  4    Period  Weeks    Status  New      PT SHORT TERM GOAL #3   Title  Patient will improve TUG with or without a device to <22 seconds to demonstrate a lesser fall risk.     Time  4    Period  Weeks    Status  New      PT SHORT TERM GOAL #4   Title  Patient will ambulate 400 ft consecutively with LRAD with minguard assist for safety.     Time  4    Period  Weeks    Status  New        PT Long Term Goals - 02/13/18 1233      PT LONG TERM GOAL #1   Title  Patient (with supervision of husband) will perform updated HEP (Target for all LTGs 04/14/2018)    Time  8    Period  Weeks    Status  New      PT LONG TERM GOAL #2   Title  Patient will improve gait velocity to >=  2.3 ft/sec to show progress towards safe community ambulation.     Time  8    Period  Weeks    Status  New      PT LONG TERM GOAL #3   Title  Patient will decr TUG to <18 sec with LRAD     Time  8     Period  Weeks    Status  New            Plan - 03/01/18 1244    Clinical Impression Statement  Pt was cleared by MD to resume therapy.  Arthritis was noted on x-ray but no fracture.  Pt seemed to have less pain today and pt did not report an increase in pain throughout session.  Gait training: pt requires Min A (HHA) amb. without device; pt continue to require cues for posture and increased steplength with HHA or RW. Dynamic standing balance training: pt requires Min A with 1 UE support.                                             Rehab Potential  Good    Clinical Impairments Affecting Rehab Potential  lt thigh pain of unknown origin (has been cleared by MD to return); decr memory    PT Frequency  2x / week    PT Duration  8 weeks    PT Treatment/Interventions  ADLs/Self Care Home Management;Gait training;DME Instruction;Stair training;Functional mobility training;Therapeutic activities;Therapeutic exercise;Balance training;Neuromuscular re-education;Cognitive remediation;Orthotic Fit/Training;Patient/family education;Passive range of motion;Manual techniques;Dry needling;Moist Heat;Ultrasound;Taping Plan updated to further address left thigh pain; recertification sent to reflect changes    PT Next Visit Plan  standing balance; gait training RW/ HHA    Consulted and Agree with Plan of Care  Patient;Family member/caregiver    Family Member Consulted  [name], husband       Patient will benefit from skilled therapeutic intervention in order to improve the following deficits and impairments:  Decreased activity tolerance, Decreased balance, Decreased cognition, Decreased mobility, Decreased knowledge of use of DME, Decreased endurance, Decreased range of motion, Decreased strength, Difficulty walking, Impaired flexibility, Postural dysfunction, Pain(TBA if pt cleared for PT)  Visit Diagnosis: Other abnormalities of gait and mobility  Muscle weakness (generalized)  Pain in left  thigh     Problem List Patient Active Problem List   Diagnosis Date Noted  . History of stroke 11/30/2017  . Cognitive deficit, post-stroke 09/30/2017  . Subarachnoid hemorrhage (HCC) 06/04/2017  . Multiple sclerosis (HCC) 05/12/2017  . CAD (coronary artery disease) 04/12/2017  . Hypertension 04/11/2017  . Right hemiparesis (HCC)   . Aspiration pneumonia of right lung (HCC)   . Leukocytosis   . Hypokalemia   . Labile blood pressure   . Abnormality of gait as late effect of stroke 02/23/2017  . Dysarthria as late effect of stroke 02/23/2017  . Dysphagia as late effect of stroke 02/23/2017  . Acute ischemic left middle cerebral artery (MCA) stroke (HCC) 02/22/2017  . Paroxysmal A-fib (HCC) 02/17/2017  . Atrial fibrillation with RVR (HCC) 02/17/2017  . HLD (hyperlipidemia) 02/17/2017  . Acute hypoxemic respiratory failure (HCC)   . Stroke (cerebrum) (HCC) - Acute MCA infarct due to left CCA/ICA and MCA occlusion, s/p mechanical thrombectomy and left ICA stenting in the setting of newly diagnosed afib 02/14/2017  . Atherosclerotic cardiovascular disease 11/07/2013    Hortencia Conradi, PTA  03/01/18, 12:52 PM [facility] [address] [place], [place], [address] Phone: [phone]   Fax:  [phone]  Name: Kaitlin [name] MRN: [id number] Date of Birth: 10-03-1945

## 2018-03-02 ENCOUNTER — Ambulatory Visit: Payer: Medicare HMO | Admitting: Psychology

## 2018-03-03 ENCOUNTER — Encounter: Payer: Self-pay | Admitting: Physical Therapy

## 2018-03-03 ENCOUNTER — Ambulatory Visit: Payer: Medicare HMO | Attending: Nurse Practitioner | Admitting: Physical Therapy

## 2018-03-03 DIAGNOSIS — M79652 Pain in left thigh: Secondary | ICD-10-CM

## 2018-03-03 DIAGNOSIS — M6281 Muscle weakness (generalized): Secondary | ICD-10-CM

## 2018-03-03 DIAGNOSIS — R2681 Unsteadiness on feet: Secondary | ICD-10-CM | POA: Diagnosis not present

## 2018-03-03 DIAGNOSIS — R2689 Other abnormalities of gait and mobility: Secondary | ICD-10-CM | POA: Insufficient documentation

## 2018-03-03 NOTE — Therapy (Signed)
[facility] [address] [place], [place], [address] Phone: [phone]   Fax:  [phone]  Physical Therapy Treatment  Patient Details  Name: Kaitlin [name] MRN: [id number] Date of Birth: 10-21-1945 Referring Provider: Nilda Riggs, NP   Encounter Date: 03/03/2018  PT End of Session - 03/03/18 1108    Visit Number  7    Number of Visits  17    Date for PT Re-Evaluation  04/14/18    Authorization Type  [employer]na Medicare    Authorization Time Period  12/27/17 to 12/27/17; 02/13/18 to 04/14/18    PT Start Time  1105    PT Stop Time  1145    PT Time Calculation (min)  40 min    Equipment Utilized During Treatment  Gait belt    Activity Tolerance  Patient tolerated treatment well;No increased pain standing exercises incr pain    Behavior During Therapy  WFL for tasks assessed/performed       Past Medical History:  Diagnosis Date  . Abnormality of gait as late effect of stroke 02/23/2017  . Acute hypoxemic respiratory failure (HCC)   . Atrial fibrillation with RVR (HCC) 02/17/2017  . CAD S/P percutaneous coronary angioplasty 2001  . CVA (cerebral vascular accident) (HCC) 01/2017  . Dysphagia as late effect of stroke 02/23/2017  . HCVD (hypertensive cardiovascular disease) 01/2017   normal LVF with moderate LVH, grade 1 DD  . High cholesterol   . Hypokalemia   . Labile blood pressure   . Leukocytosis   . Multiple sclerosis (HCC)   . PAF (paroxysmal atrial fibrillation) (HCC) 01/2017  . Right hemiparesis [facility])     Past Surgical History:  Procedure Laterality Date  . CORONARY ANGIOPLASTY WITH STENT PLACEMENT  11/1999   RCA DES-[facility]  . IR ANGIO INTRA EXTRACRAN SEL COM CAROTID INNOMINATE UNI R MOD SED  02/15/2017  . IR ANGIO VERTEBRAL SEL SUBCLAVIAN INNOMINATE UNI R MOD SED  02/15/2017  . IR INTRAVSC STENT CERV CAROTID W/O EMB-PROT MOD SED INC ANGIO  02/15/2017  . IR PERCUTANEOUS ART THROMBECTOMY/INFUSION  INTRACRANIAL INC DIAG ANGIO  02/15/2017  . IR RADIOLOGIST EVAL & MGMT  05/10/2017  . NECK SURGERY    . RADIOLOGY WITH ANESTHESIA N/A 02/14/2017   Procedure: RADIOLOGY WITH ANESTHESIA;  Surgeon: Radiologist, Medication, MD;  Location: [facility] OR;  Service: Radiology;  Laterality: N/A;  . WRIST SURGERY      There were no vitals filed for this visit.  Subjective Assessment - 03/03/18 1107    Subjective  No new complaints. No falls or pain to report. Did take her Advil prior to coming to therapy today.     Patient is accompained by:  Family member    Pertinent History  afib, CAD s/p PTCA, MS with gait disorder/memory loss, lt cca and ICA CVA 7/18  Extensive white matter disease; fall 06/2017 with [facility]     Limitations  Walking    How long can you sit comfortably?  unlimited    How long can you stand comfortably?  <1 minute    How long can you walk comfortably?  none    Diagnostic tests  MRI 12/08/17 with no acute changes    Patient Stated Goals  pt unable to state; husband wants pt to be able to walk again without a device    Currently in Pain?  No/denies    Pain Score  0-No pain           [facility]  Adult PT Treatment/Exercise - 03/03/18 1109      Transfers   Transfers  Sit to Stand;Stand to Sit    Sit to Stand  4: Min guard;With upper extremity assist;From bed;From chair/3-in-1    Sit to Stand Details  Verbal cues for sequencing;Verbal cues for safe use of DME/AE    Stand to Sit  4: Min guard;With upper extremity assist;To bed;To chair/3-in-1    Stand to Sit Details (indicate cue type and reason)  Verbal cues for technique;Verbal cues for safe use of DME/AE      Ambulation/Gait   Ambulation/Gait  Yes    Ambulation/Gait Assistance  4: Min guard;5: Supervision;4: Min assist    Ambulation/Gait Assistance Details  Utilized green theraband behind pt's buttocks attached to both sides of walker to faciliate closer walker position with pt exhibiiting more upright posture with gait. continued to need  cues for increased step length. progressed to gait with no AD with min guard to min assist for balance.     Ambulation Distance (Feet)  350 Feet x1 with RW; 115 no AD    Assistive device  Rolling walker    Gait Pattern  Step-through pattern;Decreased stride length;Narrow base of support occasional foot scuffing noted.     Ambulation Surface  Level;Indoor      High Level Balance   High Level Balance Activities  Side stepping;Marching forwards;Tandem walking    High Level Balance Comments  at counter top with UE support for balance: side stepping left<>right x 2 laps each way, pt reported LE discomfort with 1st lap and less with 2cd lap; fwd marching x4 laps with cues needed on posture and for "high knees" with no issues reported; tandem gait fwd only with cues for heel to toe placement and posture x 4 laps. min guard to min assist for balance at times.                            Knee/Hip Exercises: Aerobic   Nustep  UE/LE's at level 2.0 for 8 minutes with goal for >/= 60 steps per minute for strengthening and activity tolerance      Knee/Hip Exercises: Standing   Heel Raises  Both;1 set;10 reps;5 seconds;Limitations    Heel Raises Limitations  with UE support: heel<>toe raises with 5 sec holds each, cues on posture needed.           PT Short Term Goals - 02/13/18 1229      PT SHORT TERM GOAL #1   Title  Patient (with supervision to min-guard assistance of her spouse) will perform basic HEP for balance and strengthening bil LEs. (Target STGs 03/15/2018)    Time  4    Period  Weeks    Status  New      PT SHORT TERM GOAL #2   Title  Patient will improve gait velocity to >=1.81 Ft/sec to demonstrate decr fall risk    Time  4    Period  Weeks    Status  New      PT SHORT TERM GOAL #3   Title  Patient will improve TUG with or without a device to <22 seconds to demonstrate a lesser fall risk.     Time  4    Period  Weeks    Status  New      PT SHORT TERM GOAL #4   Title  Patient  will ambulate 400 ft consecutively with LRAD with minguard  assist for safety.     Time  4    Period  Weeks    Status  New        PT Long Term Goals - 02/13/18 1233      PT LONG TERM GOAL #1   Title  Patient (with supervision of husband) will perform updated HEP (Target for all LTGs 04/14/2018)    Time  8    Period  Weeks    Status  New      PT LONG TERM GOAL #2   Title  Patient will improve gait velocity to >=2.3 ft/sec to show progress towards safe community ambulation.     Time  8    Period  Weeks    Status  New      PT LONG TERM GOAL #3   Title  Patient will decr TUG to <18 sec with LRAD     Time  8    Period  Weeks    Status  New            Plan - 03/03/18 1109    Clinical Impression Statement  Today's skilled session continued to focus on gait with RW vs no AD. Focused on walker positioning today with gait with carryover noted with reminder cues needed. Improved gait with no AD as pt did not need HHA today, does still need min guard to min assist as she fatigues for balance recovery. Remainder of session addressed strengthening adn balance. Pt did report some LE discomfort with side stepping and Nustep that eased off with rest. Denied pain, stated it was "just sore". Pt is progressing toward goals and should benefit from continued PT to progress toward unmet goals.     Rehab Potential  Good    Clinical Impairments Affecting Rehab Potential  lt thigh pain of unknown origin (has been cleared by MD to return); decr memory    PT Frequency  2x / week    PT Duration  8 weeks    PT Treatment/Interventions  ADLs/Self Care Home Management;Gait training;DME Instruction;Stair training;Functional mobility training;Therapeutic activities;Therapeutic exercise;Balance training;Neuromuscular re-education;Cognitive remediation;Orthotic Fit/Training;Patient/family education;Passive range of motion;Manual techniques;Dry needling;Moist Heat;Ultrasound;Taping Plan updated to further address  left thigh pain; recertification sent to reflect changes    PT Next Visit Plan  standing balance; gait training RW vs no AD, LE strengthening     Consulted and Agree with Plan of Care  Patient;Family member/caregiver    Family Member Consulted  [name], husband       Patient will benefit from skilled therapeutic intervention in order to improve the following deficits and impairments:  Decreased activity tolerance, Decreased balance, Decreased cognition, Decreased mobility, Decreased knowledge of use of DME, Decreased endurance, Decreased range of motion, Decreased strength, Difficulty walking, Impaired flexibility, Postural dysfunction, Pain(TBA if pt cleared for PT)  Visit Diagnosis: Other abnormalities of gait and mobility  Muscle weakness (generalized)  Pain in left thigh     Problem List Patient Active Problem List   Diagnosis Date Noted  . History of stroke 11/30/2017  . Cognitive deficit, post-stroke 09/30/2017  . Subarachnoid hemorrhage (HCC) 06/04/2017  . Multiple sclerosis (HCC) 05/12/2017  . CAD (coronary artery disease) 04/12/2017  . Hypertension 04/11/2017  . Right hemiparesis (HCC)   . Aspiration pneumonia of right lung (HCC)   . Leukocytosis   . Hypokalemia   . Labile blood pressure   . Abnormality of gait as late effect of stroke 02/23/2017  . Dysarthria as late effect of stroke 02/23/2017  .  Dysphagia as late effect of stroke 02/23/2017  . Acute ischemic left middle cerebral artery (MCA) stroke (HCC) 02/22/2017  . Paroxysmal A-fib (HCC) 02/17/2017  . Atrial fibrillation with RVR (HCC) 02/17/2017  . HLD (hyperlipidemia) 02/17/2017  . Acute hypoxemic respiratory failure (HCC)   . Stroke (cerebrum) (HCC) - Acute MCA infarct due to left CCA/ICA and MCA occlusion, s/p mechanical thrombectomy and left ICA stenting in the setting of newly diagnosed afib 02/14/2017  . Atherosclerotic cardiovascular disease 11/07/2013    Sallyanne Kuster, PTA, [facility] [address], [address] [place], [place] [address] [phone] 03/03/18, 1:04 PM   Name: Kaitlin [name] MRN: [id number] Date of Birth: 01-18-1946

## 2018-03-06 ENCOUNTER — Ambulatory Visit: Payer: Medicare HMO | Admitting: Physical Therapy

## 2018-03-06 ENCOUNTER — Encounter: Payer: Self-pay | Admitting: Physical Therapy

## 2018-03-06 DIAGNOSIS — R2681 Unsteadiness on feet: Secondary | ICD-10-CM | POA: Diagnosis not present

## 2018-03-06 DIAGNOSIS — R2689 Other abnormalities of gait and mobility: Secondary | ICD-10-CM | POA: Diagnosis not present

## 2018-03-06 DIAGNOSIS — M6281 Muscle weakness (generalized): Secondary | ICD-10-CM | POA: Diagnosis not present

## 2018-03-06 DIAGNOSIS — M79652 Pain in left thigh: Secondary | ICD-10-CM

## 2018-03-06 NOTE — Therapy (Signed)
[facility] [address] [place], [place], [address] Phone: [phone]   Fax:  [phone]  Physical Therapy Treatment  Patient Details  Name: Kaitlin [name] MRN: [id number] Date of Birth: 10-16-1945 Referring Provider: Nilda Riggs, NP   Encounter Date: 03/06/2018  PT End of Session - 03/06/18 1023    Visit Number  8    Number of Visits  17    Date for PT Re-Evaluation  04/14/18    Authorization Type  [employer] Medicare    Authorization Time Period  12/27/17 to 12/27/17; 02/13/18 to 04/14/18    PT Start Time  1019    PT Stop Time  1059    PT Time Calculation (min)  40 min    Equipment Utilized During Treatment  Gait belt    Activity Tolerance  Patient tolerated treatment well increased pain with some activity that resolved to baseline with rest breaks    Behavior During Therapy  [facility] for tasks assessed/performed       Past Medical History:  Diagnosis Date  . Abnormality of gait as late effect of stroke 02/23/2017  . Acute hypoxemic respiratory failure (HCC)   . Atrial fibrillation with RVR (HCC) 02/17/2017  . CAD S/P percutaneous coronary angioplasty 2001  . CVA (cerebral vascular accident) (HCC) 01/2017  . Dysphagia as late effect of stroke 02/23/2017  . HCVD (hypertensive cardiovascular disease) 01/2017   normal LVF with moderate LVH, grade 1 DD  . High cholesterol   . Hypokalemia   . Labile blood pressure   . Leukocytosis   . Multiple sclerosis (HCC)   . PAF (paroxysmal atrial fibrillation) (HCC) 01/2017  . Right hemiparesis [facility])     Past Surgical History:  Procedure Laterality Date  . CORONARY ANGIOPLASTY WITH STENT PLACEMENT  11/1999   RCA DES-[facility]  . IR ANGIO INTRA EXTRACRAN SEL COM CAROTID INNOMINATE UNI R MOD SED  02/15/2017  . IR ANGIO VERTEBRAL SEL SUBCLAVIAN INNOMINATE UNI R MOD SED  02/15/2017  . IR INTRAVSC STENT CERV CAROTID W/O EMB-PROT MOD SED INC ANGIO  02/15/2017  . IR PERCUTANEOUS ART  THROMBECTOMY/INFUSION INTRACRANIAL INC DIAG ANGIO  02/15/2017  . IR RADIOLOGIST EVAL & MGMT  05/10/2017  . NECK SURGERY    . RADIOLOGY WITH ANESTHESIA N/A 02/14/2017   Procedure: RADIOLOGY WITH ANESTHESIA;  Surgeon: Radiologist, Medication, MD;  Location: [facility] OR;  Service: Radiology;  Laterality: N/A;  . WRIST SURGERY      There were no vitals filed for this visit.  Subjective Assessment - 03/06/18 1020    Subjective  No new complaints. No falls to report. Some hip/thigh pain today, did take her Advil before coming in today. Spouse reports she did not need any over the weekend, even with doing her ex's at home.     Patient is accompained by:  Family member spouse, [name]    Pertinent History  afib, CAD s/p PTCA, MS with gait disorder/memory loss, lt cca and ICA CVA 7/18  Extensive white matter disease; fall 06/2017 with [facility]     Limitations  Walking    How long can you sit comfortably?  unlimited    How long can you stand comfortably?  <1 minute    How long can you walk comfortably?  none    Diagnostic tests  MRI 12/08/17 with no acute changes    Patient Stated Goals  pt unable to state; husband wants pt to be able to walk again without a device  Currently in Pain?  Yes    Pain Score  2     Pain Location  Leg left thigh/hip    Pain Orientation  Left    Pain Descriptors / Indicators  Discomfort;Aching    Pain Type  Acute pain    Pain Onset  More than a month ago    Pain Frequency  Intermittent    Aggravating Factors   increased activity, weight bearing    Pain Relieving Factors  sitting down and resting off her leg            [facility] Adult PT Treatment/Exercise - 03/06/18 1023      Transfers   Transfers  Sit to Stand;Stand to Sit    Sit to Stand  5: Supervision;With upper extremity assist;From chair/3-in-1;From bed    Sit to Stand Details  Verbal cues for sequencing;Verbal cues for safe use of DME/AE    Sit to Stand Details (indicate cue type and reason)  cues for safe hand placement     Stand to Sit  5: Supervision;With upper extremity assist;To chair/3-in-1;To bed    Stand to Sit Details (indicate cue type and reason)  Verbal cues for technique;Verbal cues for safe use of DME/AE    Stand to Sit Details  cues for safe hand placement      Ambulation/Gait   Ambulation/Gait  Yes    Ambulation/Gait Assistance  5: Supervision;4: Min guard;4: Min assist supervision with RW; min guard to min no AD    Ambulation/Gait Assistance Details  with RW: cues on posture, incr step length and RW position with gait. pt with tight grip on walker with heavy UE weight bearing; gait without AD with min guard to min assist due to forward lean at times and mild unsteadiness with gait. cues needed as well for increased right step length/foot clearance with swing phase of gait without AD.      Ambulation Distance (Feet)  460 Feet x1 with RW; 115 no AD    Assistive device  Rolling walker    Gait Pattern  Step-through pattern;Decreased stride length;Narrow base of support    Ambulation Surface  Level;Indoor          Balance Exercises - 03/06/18 1036      Balance Exercises: Standing   Rockerboard  Anterior/posterior;Lateral;Head turns;EO;EC;30 seconds;10 reps    Balance Beam  standing across blue foam beam: alternating fwd stepping to floor/back onto beam, then alternating bwd stepping to floor/back onto beam. 10 reps each with cues on posture, larger step length and increased step height with each rep. min guard to min assist for balance.       Balance Exercises: Standing   Rebounder Limitations  performed both ways on balance board with no UE support to intermittent touch to bars at times: with EO rocking the board with emphasis on tall posture; then holding the board steady with EC no head movements, progressing to EC head movements left<>right, then up<>down. min guard to min assist for balance with cues on posture and weight shifting needed.                                PT Short Term  Goals - 02/13/18 1229      PT SHORT TERM GOAL #1   Title  Patient (with supervision to min-guard assistance of her spouse) will perform basic HEP for balance and strengthening bil LEs. (Target STGs 03/15/2018)  Time  4    Period  Weeks    Status  New      PT SHORT TERM GOAL #2   Title  Patient will improve gait velocity to >=1.81 Ft/sec to demonstrate decr fall risk    Time  4    Period  Weeks    Status  New      PT SHORT TERM GOAL #3   Title  Patient will improve TUG with or without a device to <22 seconds to demonstrate a lesser fall risk.     Time  4    Period  Weeks    Status  New      PT SHORT TERM GOAL #4   Title  Patient will ambulate 400 ft consecutively with LRAD with minguard assist for safety.     Time  4    Period  Weeks    Status  New        PT Long Term Goals - 02/13/18 1233      PT LONG TERM GOAL #1   Title  Patient (with supervision of husband) will perform updated HEP (Target for all LTGs 04/14/2018)    Time  8    Period  Weeks    Status  New      PT LONG TERM GOAL #2   Title  Patient will improve gait velocity to >=2.3 ft/sec to show progress towards safe community ambulation.     Time  8    Period  Weeks    Status  New      PT LONG TERM GOAL #3   Title  Patient will decr TUG to <18 sec with LRAD     Time  8    Period  Weeks    Status  New            Plan - 03/06/18 1023    Clinical Impression Statement  Today's skilled session continued to focus on gait with LRAD and balance reactions. Pt with increased gait distance with RW before needing a rest break. Continues to need up to in assist with gait with no AD due to ant lean and unsteadiness. Pt is progressing toward goals and should benefit from continued PT to progress toward unmet goals.     Rehab Potential  Good    Clinical Impairments Affecting Rehab Potential  lt thigh pain of unknown origin (has been cleared by MD to return); decr memory    PT Frequency  2x / week    PT Duration  8  weeks    PT Treatment/Interventions  ADLs/Self Care Home Management;Gait training;DME Instruction;Stair training;Functional mobility training;Therapeutic activities;Therapeutic exercise;Balance training;Neuromuscular re-education;Cognitive remediation;Orthotic Fit/Training;Patient/family education;Passive range of motion;Manual techniques;Dry needling;Moist Heat;Ultrasound;Taping Plan updated to further address left thigh pain; recertification sent to reflect changes    PT Next Visit Plan  standing balance; gait training RW vs no AD, LE strengthening. STGs due 03/15/18.    Consulted and Agree with Plan of Care  Patient;Family member/caregiver    Family Member Consulted  [name], husband       Patient will benefit from skilled therapeutic intervention in order to improve the following deficits and impairments:  Decreased activity tolerance, Decreased balance, Decreased cognition, Decreased mobility, Decreased knowledge of use of DME, Decreased endurance, Decreased range of motion, Decreased strength, Difficulty walking, Impaired flexibility, Postural dysfunction, Pain(TBA if pt cleared for PT)  Visit Diagnosis: Other abnormalities of gait and mobility  Muscle weakness (generalized)  Pain in left thigh  Unsteadiness  on feet     Problem List Patient Active Problem List   Diagnosis Date Noted  . History of stroke 11/30/2017  . Cognitive deficit, post-stroke 09/30/2017  . Subarachnoid hemorrhage (HCC) 06/04/2017  . Multiple sclerosis (HCC) 05/12/2017  . CAD (coronary artery disease) 04/12/2017  . Hypertension 04/11/2017  . Right hemiparesis (HCC)   . Aspiration pneumonia of right lung (HCC)   . Leukocytosis   . Hypokalemia   . Labile blood pressure   . Abnormality of gait as late effect of stroke 02/23/2017  . Dysarthria as late effect of stroke 02/23/2017  . Dysphagia as late effect of stroke 02/23/2017  . Acute ischemic left middle cerebral artery (MCA) stroke (HCC) 02/22/2017  .  Paroxysmal A-fib (HCC) 02/17/2017  . Atrial fibrillation with RVR (HCC) 02/17/2017  . HLD (hyperlipidemia) 02/17/2017  . Acute hypoxemic respiratory failure (HCC)   . Stroke (cerebrum) (HCC) - Acute MCA infarct due to left CCA/ICA and MCA occlusion, s/p mechanical thrombectomy and left ICA stenting in the setting of newly diagnosed afib 02/14/2017  . Atherosclerotic cardiovascular disease 11/07/2013    Sallyanne Kuster, PTA, [facility] [address] [place], [place] [address] [phone] 03/06/18, 11:10 AM   Name: Kaitlin [name] MRN: [id number] Date of Birth: 1946-01-31

## 2018-03-10 ENCOUNTER — Ambulatory Visit: Payer: Medicare HMO | Admitting: Physical Therapy

## 2018-03-10 ENCOUNTER — Encounter: Payer: Self-pay | Admitting: Physical Therapy

## 2018-03-10 DIAGNOSIS — M79652 Pain in left thigh: Secondary | ICD-10-CM

## 2018-03-10 DIAGNOSIS — R2689 Other abnormalities of gait and mobility: Secondary | ICD-10-CM | POA: Diagnosis not present

## 2018-03-10 DIAGNOSIS — R2681 Unsteadiness on feet: Secondary | ICD-10-CM | POA: Diagnosis not present

## 2018-03-10 DIAGNOSIS — M6281 Muscle weakness (generalized): Secondary | ICD-10-CM

## 2018-03-10 NOTE — Therapy (Signed)
[facility] [address] [place], [place], [address] Phone: [phone]   Fax:  [phone]  Physical Therapy Treatment  Patient Details  Name: Kaitlin [name] MRN: [id number] Date of Birth: [birth year] Referring Provider: Dennie Bible, NP   Encounter Date: 03/10/2018  PT End of Session - 03/10/18 1927    Visit Number  9    Number of Visits  17    Date for PT Re-Evaluation  04/14/18    Authorization Type  [employer]na Medicare    Authorization Time Period  12/27/17 to 12/27/17; 02/13/18 to 04/14/18    PT Start Time  1019    PT Stop Time  1103    PT Time Calculation (min)  44 min    Equipment Utilized During Treatment  Gait belt    Activity Tolerance  Patient tolerated treatment well   increased pain with some activity that resolved to baseline with rest breaks   Behavior During Therapy  [facility] for tasks assessed/performed       Past Medical History:  Diagnosis Date  . Abnormality of gait as late effect of stroke 02/23/2017  . Acute hypoxemic respiratory failure ([place])   . Atrial fibrillation with RVR ([place]) 02/17/2017  . CAD S/P percutaneous coronary angioplasty 2001  . CVA (cerebral vascular accident) ([place]) 01/2017  . Dysphagia as late effect of stroke 02/23/2017  . HCVD (hypertensive cardiovascular disease) 01/2017   normal LVF with moderate LVH, grade 1 DD  . High cholesterol   . Hypokalemia   . Labile blood pressure   . Leukocytosis   . Multiple sclerosis ([place])   . PAF (paroxysmal atrial fibrillation) ([place]) 01/2017  . Right hemiparesis [facility])     Past Surgical History:  Procedure Laterality Date  . CORONARY ANGIOPLASTY WITH STENT PLACEMENT  11/1999   RCA DES-[facility]  . IR ANGIO INTRA EXTRACRAN SEL COM CAROTID INNOMINATE UNI R MOD SED  02/15/2017  . IR ANGIO VERTEBRAL SEL SUBCLAVIAN INNOMINATE UNI R MOD SED  02/15/2017  . IR INTRAVSC STENT CERV CAROTID W/O EMB-PROT MOD SED INC ANGIO  02/15/2017  . IR PERCUTANEOUS  ART THROMBECTOMY/INFUSION INTRACRANIAL INC DIAG ANGIO  02/15/2017  . IR RADIOLOGIST EVAL & MGMT  05/10/2017  . NECK SURGERY    . RADIOLOGY WITH ANESTHESIA N/A 02/14/2017   Procedure: RADIOLOGY WITH ANESTHESIA;  Surgeon: Radiologist, Medication, MD;  Location: [facility];  Service: Radiology;  Laterality: N/A;  . WRIST SURGERY      There were no vitals filed for this visit.  Subjective Assessment - 03/10/18 1024    Subjective  Thigh is better but not well yet.     Patient is accompained by:  Family member   spouse, [name]   Pertinent History  afib, CAD s/p PTCA, MS with gait disorder/memory loss, lt cca and ICA CVA 7/18  Extensive white matter disease; fall 06/2017 with [facility]     Limitations  Walking    How long can you sit comfortably?  unlimited    How long can you stand comfortably?  <1 minute    How long can you walk comfortably?  none    Diagnostic tests  MRI 12/08/17 with no acute changes    Patient Stated Goals  pt unable to state; husband wants pt to be able to walk again without a device    Currently in Pain?  Yes    Pain Score  2     Pain Location  --   thigh   Pain  Orientation  Left    Pain Descriptors / Indicators  Discomfort    Pain Type  Acute pain    Pain Onset  More than a month ago    Pain Frequency  Intermittent                       [facility] Adult PT Treatment/Exercise - 03/10/18 1044      Ambulation/Gait   Ambulation/Gait Assistance  5: Supervision    Ambulation/Gait Assistance Details  with RW; vc for proximity to RW; with no device x 20 ft with incr antalgic gait and reports incr left thigh pain    Ambulation Distance (Feet)  400 Feet    Assistive device  Rolling walker;None    Gait Pattern  Step-through pattern;Decreased stride length;Narrow base of support;Decreased arm swing - left;Decreased arm swing - right;Antalgic    Ambulation Surface  Indoor    Gait velocity  20/13.6 sec=1.47 ft/sec      Timed Up and Go Test   Normal TUG (seconds)  19.28   with  RW              PT Short Term Goals - 03/10/18 1929      PT SHORT TERM GOAL #1   Title  Patient (with supervision to min-guard assistance of her spouse) will perform basic HEP for balance and strengthening bil LEs. (Target STGs 03/15/2018)    Time  4    Period  Weeks    Status  Achieved      PT SHORT TERM GOAL #2   Title  Patient will improve gait velocity to >=1.81 Ft/sec to demonstrate decr fall risk    Baseline  03/10/18 with RW 1.47 ft/sec with RW    Time  4    Period  Weeks    Status  Partially Met      PT SHORT TERM GOAL #3   Title  Patient will improve TUG with or without a device to <22 seconds to demonstrate a lesser fall risk.     Baseline  03/10/18 19.28 sec    Time  4    Period  Weeks    Status  Achieved      PT SHORT TERM GOAL #4   Title  Patient will ambulate 400 ft consecutively with LRAD with minguard assist for safety.     Baseline  03/10/18 met    Time  4    Period  Weeks    Status  Achieved        PT Long Term Goals - 02/13/18 1233      PT LONG TERM GOAL #1   Title  Patient (with supervision of husband) will perform updated HEP (Target for all LTGs 04/14/2018)    Time  8    Period  Weeks    Status  New      PT LONG TERM GOAL #2   Title  Patient will improve gait velocity to >=2.3 ft/sec to show progress towards safe community ambulation.     Time  8    Period  Weeks    Status  New      PT LONG TERM GOAL #3   Title  Patient will decr TUG to <18 sec with LRAD     Time  8    Period  Weeks    Status  New            Plan - 03/10/18 1931    Clinical Impression  Statement  STGs assessed with pt meeting 3 of 4 goals and 1 goal was partially met (improved however not to goal level). Patient with nearly immediate incr in her left lateral thigh pain when attempted walking without her walker. Per husband, MD thinks her lateral thigh pain is referred pain from her arthritis in her hip. Will continue to work on strengthening her hips to improve  stability and lessen pain.    Rehab Potential  Good    Clinical Impairments Affecting Rehab Potential  lt thigh pain of unknown origin (has been cleared by MD to return); decr memory    PT Frequency  2x / week    PT Duration  8 weeks    PT Treatment/Interventions  ADLs/Self Care Home Management;Gait training;DME Instruction;Stair training;Functional mobility training;Therapeutic activities;Therapeutic exercise;Balance training;Neuromuscular re-education;Cognitive remediation;Orthotic Fit/Training;Patient/family education;Passive range of motion;Manual techniques;Dry needling;Moist Heat;Ultrasound;Taping   Plan updated to further address left thigh pain; recertification sent to reflect changes   PT Next Visit Plan  10th progress note due; check HEP for ? need to update; ? add any exercises to address Lt hip arthritis; standing balance; gait training RW vs no AD, LE strengthening.     Consulted and Agree with Plan of Care  Patient;Family member/caregiver    Family Member Consulted  [name], husband       Patient will benefit from skilled therapeutic intervention in order to improve the following deficits and impairments:  Decreased activity tolerance, Decreased balance, Decreased cognition, Decreased mobility, Decreased knowledge of use of DME, Decreased endurance, Decreased range of motion, Decreased strength, Difficulty walking, Impaired flexibility, Postural dysfunction, Pain(TBA if pt cleared for PT)  Visit Diagnosis: Other abnormalities of gait and mobility  Muscle weakness (generalized)  Pain in left thigh     Problem List Patient Active Problem List   Diagnosis Date Noted  . History of stroke 11/30/2017  . Cognitive deficit, post-stroke 09/30/2017  . Subarachnoid hemorrhage ([place]) 06/04/2017  . Multiple sclerosis ([place]) 05/12/2017  . CAD (coronary artery disease) 04/12/2017  . Hypertension 04/11/2017  . Right hemiparesis ([place])   . Aspiration pneumonia of right lung ([place])   .  Leukocytosis   . Hypokalemia   . Labile blood pressure   . Abnormality of gait as late effect of stroke 02/23/2017  . Dysarthria as late effect of stroke 02/23/2017  . Dysphagia as late effect of stroke 02/23/2017  . Acute ischemic left middle cerebral artery (MCA) stroke ([place]) 02/22/2017  . Paroxysmal A-fib ([place]) 02/17/2017  . Atrial fibrillation with RVR ([place]) 02/17/2017  . HLD (hyperlipidemia) 02/17/2017  . Acute hypoxemic respiratory failure ([place])   . Stroke (cerebrum) (HCC) - Acute MCA infarct due to left CCA/ICA and MCA occlusion, s/p mechanical thrombectomy and left ICA stenting in the setting of newly diagnosed afib 02/14/2017  . Atherosclerotic cardiovascular disease 11/07/2013    Rexanne Mano, PT 03/10/2018, 7:36 PM  [facility] [address] [place], [place], [address] Phone: [phone]   Fax:  [phone]  Name: Senie Lanese MRN: [id number] Date of Birth: 01/03/1946

## 2018-03-13 ENCOUNTER — Ambulatory Visit
Admission: RE | Admit: 2018-03-13 | Discharge: 2018-03-13 | Disposition: A | Discharge status: Home / Self Care | Payer: Medicare HMO | Source: Ambulatory Visit | Attending: Family Medicine | Admitting: Family Medicine

## 2018-03-13 DIAGNOSIS — R748 Abnormal levels of other serum enzymes: Secondary | ICD-10-CM | POA: Diagnosis not present

## 2018-03-15 ENCOUNTER — Encounter: Payer: Self-pay | Admitting: Physical Therapy

## 2018-03-15 ENCOUNTER — Ambulatory Visit: Payer: Medicare HMO | Admitting: Physical Therapy

## 2018-03-15 DIAGNOSIS — M6281 Muscle weakness (generalized): Secondary | ICD-10-CM

## 2018-03-15 DIAGNOSIS — R2689 Other abnormalities of gait and mobility: Secondary | ICD-10-CM | POA: Diagnosis not present

## 2018-03-15 DIAGNOSIS — M79652 Pain in left thigh: Secondary | ICD-10-CM

## 2018-03-15 DIAGNOSIS — R2681 Unsteadiness on feet: Secondary | ICD-10-CM | POA: Diagnosis not present

## 2018-03-15 NOTE — Therapy (Signed)
[facility] [address] [place], [place], [address] Phone: [phone]   Fax:  [phone]  Physical Therapy Treatment  Patient Details  Name: Kaitlin [name] MRN: [id number] Date of Birth: 1946-03-09 Referring Provider: Dennie Bible, NP   Encounter Date: 03/15/2018   This 10th visit progress note covers the period from 12/27/17 to 03/15/2018   PT End of Session - 03/15/18 1320    Visit Number  10    Number of Visits  17    Date for PT Re-Evaluation  04/14/18    Authorization Type  Aetna Medicare    Authorization Time Period  12/27/17 to 12/27/17; 02/13/18 to 04/14/18    PT Start Time  0934    PT Stop Time  1014    PT Time Calculation (min)  40 min    Activity Tolerance  Patient tolerated treatment well   increased pain with some activity that resolved to baseline with rest breaks   Behavior During Therapy  [facility] for tasks assessed/performed       Past Medical History:  Diagnosis Date  . Abnormality of gait as late effect of stroke 02/23/2017  . Acute hypoxemic respiratory failure ([place])   . Atrial fibrillation with RVR ([place]) 02/17/2017  . CAD S/P percutaneous coronary angioplasty 2001  . CVA (cerebral vascular accident) ([place]) 01/2017  . Dysphagia as late effect of stroke 02/23/2017  . HCVD (hypertensive cardiovascular disease) 01/2017   normal LVF with moderate LVH, grade 1 DD  . High cholesterol   . Hypokalemia   . Labile blood pressure   . Leukocytosis   . Multiple sclerosis ([place])   . PAF (paroxysmal atrial fibrillation) ([place]) 01/2017  . Right hemiparesis [facility])     Past Surgical History:  Procedure Laterality Date  . CORONARY ANGIOPLASTY WITH STENT PLACEMENT  11/1999   RCA DES-[facility]  . IR ANGIO INTRA EXTRACRAN SEL COM CAROTID INNOMINATE UNI R MOD SED  02/15/2017  . IR ANGIO VERTEBRAL SEL SUBCLAVIAN INNOMINATE UNI R MOD SED  02/15/2017  . IR INTRAVSC STENT CERV CAROTID W/O EMB-PROT MOD SED INC ANGIO   02/15/2017  . IR PERCUTANEOUS ART THROMBECTOMY/INFUSION INTRACRANIAL INC DIAG ANGIO  02/15/2017  . IR RADIOLOGIST EVAL & MGMT  05/10/2017  . NECK SURGERY    . RADIOLOGY WITH ANESTHESIA N/A 02/14/2017   Procedure: RADIOLOGY WITH ANESTHESIA;  Surgeon: Radiologist, Medication, MD;  Location: [place];  Service: Radiology;  Laterality: N/A;  . WRIST SURGERY      There were no vitals filed for this visit.  Subjective Assessment - 03/15/18 0936    Subjective  Thigh is better but not well yet. Husband's theory is her statin was stopped 2 weeks ago and thinks her leg pain was related.     Patient is accompained by:  Family member   spouse, [name]   Pertinent History  afib, CAD s/p PTCA, MS with gait disorder/memory loss, lt cca and ICA CVA 7/18  Extensive white matter disease; fall 06/2017 with [facility]     Limitations  Walking    How long can you sit comfortably?  unlimited    How long can you stand comfortably?  <1 minute    How long can you walk comfortably?  none    Diagnostic tests  MRI 12/08/17 with no acute changes    Patient Stated Goals  pt unable to state; husband wants pt to be able to walk again without a device    Currently in Pain?  No/denies    Pain Onset  --                       [facility] Adult PT Treatment/Exercise - 03/15/18 0001      Transfers   Transfers  Sit to Stand;Stand to Sit    Sit to Stand  5: Supervision;With upper extremity assist;From chair/3-in-1;From bed    Sit to Stand Details (indicate cue type and reason)  vc for safe use of hands x 2 throughout entire session (much better)    Stand to Sit  5: Supervision;With upper extremity assist;To chair/3-in-1;To bed      Ambulation/Gait   Ambulation/Gait Assistance  5: Supervision    Assistive device  Rolling walker    Gait Pattern  Step-through pattern;Decreased stride length;Narrow base of support;Decreased arm swing - left;Decreased arm swing - right    Ambulation Surface  Indoor      Exercises   Exercises   Knee/Hip      Knee/Hip Exercises: Stretches   Other Knee/Hip Stretches  PROM in supine internal & external rotation stretches bil 30 sec each      Knee/Hip Exercises: Aerobic   Stepper  SciFit x 9 minutes, all 4's, L1      Knee/Hip Exercises: Standing   Hip Flexion  Stengthening;Both;1 set;5 reps;Knee bent    Hip Flexion Limitations  limited reps to minimize LLE pain    Hip Abduction  Stengthening;Both;1 set;5 reps;Knee straight    Hip Extension  Stengthening   3 reps with wincing lifting left leg and deferred further     Knee/Hip Exercises: Supine   Quad Sets  AROM;Left;10 reps   3 sec hold   Heel Slides  Strengthening;Left;5 reps;1 set   incr lateral thigh pain   Hip Adduction Isometric  Strengthening;Both;10 reps   squeezing 6 inch ball    Bridges  Strengthening;Both;1 set;10 reps    Other Supine Knee/Hip Exercises  supine butterfly only to ~45 degres and in x 10 reps             PT Education - 03/15/18 1319    Education Details  additions to HEP (standing hip exercises with low reps to "ease into" these as they have caused incr pain recently    Person(s) Educated  Patient;Spouse    Methods  Explanation;Demonstration;Verbal cues;Handout    Comprehension  Verbalized understanding;Returned demonstration;Verbal cues required;Need further instruction       PT Short Term Goals - 03/10/18 1929      PT SHORT TERM GOAL #1   Title  Patient (with supervision to min-guard assistance of her spouse) will perform basic HEP for balance and strengthening bil LEs. (Target STGs 03/15/2018)    Time  4    Period  Weeks    Status  Achieved      PT SHORT TERM GOAL #2   Title  Patient will improve gait velocity to >=1.81 Ft/sec to demonstrate decr fall risk    Baseline  03/10/18 with RW 1.47 ft/sec with RW    Time  4    Period  Weeks    Status  Partially Met      PT SHORT TERM GOAL #3   Title  Patient will improve TUG with or without a device to <22 seconds to demonstrate a lesser  fall risk.     Baseline  03/10/18 19.28 sec    Time  4    Period  Weeks    Status  Achieved  PT SHORT TERM GOAL #4   Title  Patient will ambulate 400 ft consecutively with LRAD with minguard assist for safety.     Baseline  03/10/18 met    Time  4    Period  Weeks    Status  Achieved        PT Long Term Goals - 02/13/18 1233      PT LONG TERM GOAL #1   Title  Patient (with supervision of husband) will perform updated HEP (Target for all LTGs 04/14/2018)    Time  8    Period  Weeks    Status  New      PT LONG TERM GOAL #2   Title  Patient will improve gait velocity to >=2.3 ft/sec to show progress towards safe community ambulation.     Time  8    Period  Weeks    Status  New      PT LONG TERM GOAL #3   Title  Patient will decr TUG to <18 sec with LRAD     Time  8    Period  Weeks    Status  New            Plan - 03/15/18 1321    Clinical Impression Statement  LLE pain improving and able to tolerate progression to standing activities/exercises far better than when last attempted. Pain increased from 0 to 2 and then 3/10, therefore changed to seated and supine activities. Updated HEP. Pateint making progress towards goals.     Rehab Potential  Good    Clinical Impairments Affecting Rehab Potential  lt thigh pain of unknown origin (has been cleared by MD to return); decr memory    PT Frequency  2x / week    PT Duration  8 weeks    PT Treatment/Interventions  ADLs/Self Care Home Management;Gait training;DME Instruction;Stair training;Functional mobility training;Therapeutic activities;Therapeutic exercise;Balance training;Neuromuscular re-education;Cognitive remediation;Orthotic Fit/Training;Patient/family education;Passive range of motion;Manual techniques;Dry needling;Moist Heat;Ultrasound;Taping   Plan updated to further address left thigh pain; recertification sent to reflect changes   PT Next Visit Plan  use stepper for warm-up (has done up to 9 minutes); check  tolerance for standing hip exercises added 8/14--?ready to incr # reps; standing balance; gait training RW vs no AD, LE strengthening.     Consulted and Agree with Plan of Care  Patient;Family member/caregiver    Family Member Consulted  [name], husband       Patient will benefit from skilled therapeutic intervention in order to improve the following deficits and impairments:  Decreased activity tolerance, Decreased balance, Decreased cognition, Decreased mobility, Decreased knowledge of use of DME, Decreased endurance, Decreased range of motion, Decreased strength, Difficulty walking, Impaired flexibility, Postural dysfunction, Pain(TBA if pt cleared for PT)  Visit Diagnosis: Other abnormalities of gait and mobility  Muscle weakness (generalized)  Pain in left thigh     Problem List Patient Active Problem List   Diagnosis Date Noted  . History of stroke 11/30/2017  . Cognitive deficit, post-stroke 09/30/2017  . Subarachnoid hemorrhage ([place]) 06/04/2017  . Multiple sclerosis ([place]) 05/12/2017  . CAD (coronary artery disease) 04/12/2017  . Hypertension 04/11/2017  . Right hemiparesis ([place])   . Aspiration pneumonia of right lung ([place])   . Leukocytosis   . Hypokalemia   . Labile blood pressure   . Abnormality of gait as late effect of stroke 02/23/2017  . Dysarthria as late effect of stroke 02/23/2017  . Dysphagia as late effect of stroke 02/23/2017  .  Acute ischemic left middle cerebral artery (MCA) stroke ([place]) 02/22/2017  . Paroxysmal A-fib ([place]) 02/17/2017  . Atrial fibrillation with RVR ([place]) 02/17/2017  . HLD (hyperlipidemia) 02/17/2017  . Acute hypoxemic respiratory failure ([place])   . Stroke (cerebrum) ([facility]) - Acute MCA infarct due to left CCA/ICA and MCA occlusion, s/p mechanical thrombectomy and left ICA stenting in the setting of newly diagnosed afib 02/14/2017  . Atherosclerotic cardiovascular disease 11/07/2013    Rexanne Mano, PT 03/15/2018, 1:24 PM  [facility] [address] [place], [place], [address] Phone: [phone]   Fax:  [phone]  Name: Mylinh Cragg MRN: [id number] Date of Birth: 1946-06-30

## 2018-03-15 NOTE — Patient Instructions (Signed)
     Stand facing your counter with both hands on the counter.  March in place lifting your knee as high as you can. Repeat __5__ times on each leg. Do __1__ sessions per day.       Stand facing counter with both hands resting as lightly as needed. Raise one leg out to the side and slowly lower. Then lift the other leg out to the side and slowly lower.  _5__ reps per leg, __1_ sets per day  Copyright  VHI. All rights reserved.

## 2018-03-17 ENCOUNTER — Ambulatory Visit: Payer: Medicare HMO | Admitting: Physical Therapy

## 2018-03-17 ENCOUNTER — Encounter: Payer: Self-pay | Admitting: Physical Therapy

## 2018-03-17 DIAGNOSIS — M79652 Pain in left thigh: Secondary | ICD-10-CM

## 2018-03-17 DIAGNOSIS — R2689 Other abnormalities of gait and mobility: Secondary | ICD-10-CM | POA: Diagnosis not present

## 2018-03-17 DIAGNOSIS — R2681 Unsteadiness on feet: Secondary | ICD-10-CM | POA: Diagnosis not present

## 2018-03-17 DIAGNOSIS — M6281 Muscle weakness (generalized): Secondary | ICD-10-CM

## 2018-03-17 NOTE — Therapy (Signed)
[facility] [address] [place], [place], [address] Phone: [phone]   Fax:  [phone]  Physical Therapy Treatment  Patient Details  Name: Kaitlin [name] MRN: [id number] Date of Birth: Sep 01, 1945 Referring Provider: Dennie Bible, NP   Encounter Date: 03/17/2018  PT End of Session - 03/17/18 1524    Visit Number  11    Number of Visits  17    Date for PT Re-Evaluation  04/14/18    Authorization Type  Aetna Medicare    Authorization Time Period  12/27/17 to 12/27/17; 02/13/18 to 04/14/18    PT Start Time  0933    PT Stop Time  1012    PT Time Calculation (min)  39 min    Activity Tolerance  Patient tolerated treatment well   increased pain with some activity that resolved to baseline with rest breaks   Behavior During Therapy  [facility] for tasks assessed/performed       Past Medical History:  Diagnosis Date  . Abnormality of gait as late effect of stroke 02/23/2017  . Acute hypoxemic respiratory failure ([place])   . Atrial fibrillation with RVR ([place]) 02/17/2017  . CAD S/P percutaneous coronary angioplasty 2001  . CVA (cerebral vascular accident) ([place]) 01/2017  . Dysphagia as late effect of stroke 02/23/2017  . HCVD (hypertensive cardiovascular disease) 01/2017   normal LVF with moderate LVH, grade 1 DD  . High cholesterol   . Hypokalemia   . Labile blood pressure   . Leukocytosis   . Multiple sclerosis ([place])   . PAF (paroxysmal atrial fibrillation) ([place]) 01/2017  . Right hemiparesis [facility])     Past Surgical History:  Procedure Laterality Date  . CORONARY ANGIOPLASTY WITH STENT PLACEMENT  11/1999   RCA DES[facility]  . IR ANGIO INTRA EXTRACRAN SEL COM CAROTID INNOMINATE UNI R MOD SED  02/15/2017  . IR ANGIO VERTEBRAL SEL SUBCLAVIAN INNOMINATE UNI R MOD SED  02/15/2017  . IR INTRAVSC STENT CERV CAROTID W/O EMB-PROT MOD SED INC ANGIO  02/15/2017  . IR PERCUTANEOUS ART THROMBECTOMY/INFUSION INTRACRANIAL INC DIAG  ANGIO  02/15/2017  . IR RADIOLOGIST EVAL & MGMT  05/10/2017  . NECK SURGERY    . RADIOLOGY WITH ANESTHESIA N/A 02/14/2017   Procedure: RADIOLOGY WITH ANESTHESIA;  Surgeon: Radiologist, Medication, MD;  Location: [place];  Service: Radiology;  Laterality: N/A;  . WRIST SURGERY      There were no vitals filed for this visit.  Subjective Assessment - 03/17/18 0938    Subjective  Thigh suddenly worse today. Husband thinks it happened when stepping out of the shower (she stepped out onto RLE first).     Patient is accompained by:  Family member   spouse, [name]   Pertinent History  afib, CAD s/p PTCA, MS with gait disorder/memory loss, lt cca and ICA CVA 7/18  Extensive white matter disease; fall 06/2017 with [facility]     Limitations  Walking    How long can you sit comfortably?  unlimited    How long can you stand comfortably?  <1 minute    How long can you walk comfortably?  none    Diagnostic tests  MRI 12/08/17 with no acute changes    Patient Stated Goals  pt unable to state; husband wants pt to be able to walk again without a device    Currently in Pain?  Yes  (Pended)     Pain Score  5   (Pended)     Pain  Location  --  (Pended)    left thigh   Pain Orientation  Left;Lateral  (Pended)     Pain Descriptors / Indicators  Aching  (Pended)     Pain Type  Acute pain;Chronic pain  (Pended)    acute incr today   Pain Onset  More than a month ago  (Pended)     Pain Frequency  Intermittent  (Pended)     Aggravating Factors   increased weight-bearing  (Pended)     Pain Relieving Factors  heat  (Pended)                        [facility] Adult PT Treatment/Exercise - 03/17/18 1016      Transfers   Transfers  Sit to Stand;Stand to Sit    Sit to Stand  5: Supervision;With upper extremity assist;From chair/3-in-1;From bed    Sit to Stand Details  Verbal cues for sequencing;Verbal cues for safe use of DME/AE    Sit to Stand Details (indicate cue type and reason)  cues for safe use of hands/RW     Stand to Sit  5: Supervision;With upper extremity assist;To chair/3-in-1;To bed    Stand to Sit Details  cues for safe use of hands/RW      Ambulation/Gait   Ambulation/Gait Assistance  5: Supervision    Ambulation/Gait Assistance Details  with incr antalgic gait noted, shorter steps, decr velocity    Ambulation Distance (Feet)  80 Feet    Assistive device  Rolling walker    Gait Pattern  Step-through pattern;Decreased stride length;Decreased weight shift to left;Antalgic    Ambulation Surface  Indoor      Knee/Hip Exercises: Aerobic   Other Aerobic  restorator (seated in chair with back support); 1/2 revolutions with RLE providing movement and LLE " just going along" for gentle ROM in limited range; pt has a recumbent bike at home that husband plans to have someone move downstairs for easier access      Modalities   Modalities  Moist Heat      Moist Heat Therapy   Number Minutes Moist Heat  8 Minutes    Moist Heat Location  Hip   left hip and lateral thigh with pt in supine, knees over bol     Manual Therapy   Manual Therapy  Joint mobilization;Passive ROM;Manual Traction    Manual therapy comments  manual therapy for decreasing rt hip/lateral thigh pain; creating relaxation of surrounding muscles    Joint Mobilization  grade I-II mobs left hip with distraction and slight ROM in external rotation, flexion and abduction    Passive ROM  internal rotation, hooklying feet together to knees apart 10 inches and then knees back together, hip flexed to 90 (in supine) with gentle flexion/extension    Manual Traction  LLE using femoral condyles x 30 sec x 3             PT Education - 03/17/18 1523    Education Details  changes to HEP due to incr left hip/thigh pain (work on cycling/partial revolutions to full revolutions only); if pain continues to worsen, recommend see an orthopedist    Person(s) Educated  Patient;Spouse    Methods  Explanation;Demonstration;Verbal cues     Comprehension  Verbalized understanding;Returned demonstration;Need further instruction;Verbal cues required       PT Short Term Goals - 03/10/18 1929      PT SHORT TERM GOAL #1   Title  Patient (with supervision  to min-guard assistance of her spouse) will perform basic HEP for balance and strengthening bil LEs. (Target STGs 03/15/2018)    Time  4    Period  Weeks    Status  Achieved      PT SHORT TERM GOAL #2   Title  Patient will improve gait velocity to >=1.81 Ft/sec to demonstrate decr fall risk    Baseline  03/10/18 with RW 1.47 ft/sec with RW    Time  4    Period  Weeks    Status  Partially Met      PT SHORT TERM GOAL #3   Title  Patient will improve TUG with or without a device to <22 seconds to demonstrate a lesser fall risk.     Baseline  03/10/18 19.28 sec    Time  4    Period  Weeks    Status  Achieved      PT SHORT TERM GOAL #4   Title  Patient will ambulate 400 ft consecutively with LRAD with minguard assist for safety.     Baseline  03/10/18 met    Time  4    Period  Weeks    Status  Achieved        PT Long Term Goals - 02/13/18 1233      PT LONG TERM GOAL #1   Title  Patient (with supervision of husband) will perform updated HEP (Target for all LTGs 04/14/2018)    Time  8    Period  Weeks    Status  New      PT LONG TERM GOAL #2   Title  Patient will improve gait velocity to >=2.3 ft/sec to show progress towards safe community ambulation.     Time  8    Period  Weeks    Status  New      PT LONG TERM GOAL #3   Title  Patient will decr TUG to <18 sec with LRAD     Time  8    Period  Weeks    Status  New            Plan - 03/17/18 1525    Clinical Impression Statement  Patient arrived with incr LLE pain as of this morning when stepping out of the shower. Patient does not recall any movement that may have triggered the pain. On arrival her pain was a 5/10 (mostly has been 2/10 lately). Provided heat followed by manual therapy and gentle exercise to  attempt to reduce pain. Educated on need for activity/ROM with arthritis and not to overdue her exercises. By end of session, pain was reduced to 3/10. Husband present for entire session and educated on how to proceed with her exercises/activity through the weekend (and when to hold activity if pain worsening).     Rehab Potential  Good    Clinical Impairments Affecting Rehab Potential  lt thigh pain of unknown origin (has been cleared by MD to return); decr memory    PT Frequency  2x / week    PT Duration  8 weeks    PT Treatment/Interventions  ADLs/Self Care Home Management;Gait training;DME Instruction;Stair training;Functional mobility training;Therapeutic activities;Therapeutic exercise;Balance training;Neuromuscular re-education;Cognitive remediation;Orthotic Fit/Training;Patient/family education;Passive range of motion;Manual techniques;Dry needling;Moist Heat;Ultrasound;Taping   Plan updated to further address left thigh pain; recertification sent to reflect changes   PT Next Visit Plan  use stepper vs restorator for warm-up (has done up to 9 minutes); check tolerance for activity since last visit; standing  balance; gait training RW, LE strengthening.     Consulted and Agree with Plan of Care  Patient;Family member/caregiver    Family Member Consulted  [name], husband       Patient will benefit from skilled therapeutic intervention in order to improve the following deficits and impairments:  Decreased activity tolerance, Decreased balance, Decreased cognition, Decreased mobility, Decreased knowledge of use of DME, Decreased endurance, Decreased range of motion, Decreased strength, Difficulty walking, Impaired flexibility, Postural dysfunction, Pain(TBA if pt cleared for PT)  Visit Diagnosis: Muscle weakness (generalized)  Pain in left thigh     Problem List Patient Active Problem List   Diagnosis Date Noted  . History of stroke 11/30/2017  . Cognitive deficit, post-stroke 09/30/2017   . Subarachnoid hemorrhage ([place]) 06/04/2017  . Multiple sclerosis ([place]) 05/12/2017  . CAD (coronary artery disease) 04/12/2017  . Hypertension 04/11/2017  . Right hemiparesis ([place])   . Aspiration pneumonia of right lung ([place])   . Leukocytosis   . Hypokalemia   . Labile blood pressure   . Abnormality of gait as late effect of stroke 02/23/2017  . Dysarthria as late effect of stroke 02/23/2017  . Dysphagia as late effect of stroke 02/23/2017  . Acute ischemic left middle cerebral artery (MCA) stroke ([place]) 02/22/2017  . Paroxysmal A-fib ([place]) 02/17/2017  . Atrial fibrillation with RVR ([place]) 02/17/2017  . HLD (hyperlipidemia) 02/17/2017  . Acute hypoxemic respiratory failure ([place])   . Stroke (cerebrum) ([facility]) - Acute MCA infarct due to left CCA/ICA and MCA occlusion, s/p mechanical thrombectomy and left ICA stenting in the setting of newly diagnosed afib 02/14/2017  . Atherosclerotic cardiovascular disease 11/07/2013    Rexanne Mano, PT 03/17/2018, 7:27 PM  [facility] [address] [place], [place], [address] Phone: [phone]   Fax:  [phone]  Name: Kaitlin [name] MRN: [id number] Date of Birth: November 29, 1945

## 2018-03-18 DIAGNOSIS — I69351 Hemiplegia and hemiparesis following cerebral infarction affecting right dominant side: Secondary | ICD-10-CM | POA: Diagnosis not present

## 2018-03-18 DIAGNOSIS — G35 Multiple sclerosis: Secondary | ICD-10-CM | POA: Diagnosis not present

## 2018-03-18 DIAGNOSIS — I639 Cerebral infarction, unspecified: Secondary | ICD-10-CM | POA: Diagnosis not present

## 2018-03-18 DIAGNOSIS — R2689 Other abnormalities of gait and mobility: Secondary | ICD-10-CM | POA: Diagnosis not present

## 2018-03-22 ENCOUNTER — Other Ambulatory Visit (HOSPITAL_COMMUNITY): Payer: Self-pay | Admitting: Interventional Radiology

## 2018-03-22 ENCOUNTER — Encounter: Payer: Self-pay | Admitting: Physical Therapy

## 2018-03-22 ENCOUNTER — Telehealth (HOSPITAL_COMMUNITY): Payer: Self-pay

## 2018-03-22 ENCOUNTER — Ambulatory Visit: Payer: Medicare HMO | Admitting: Physical Therapy

## 2018-03-22 DIAGNOSIS — I639 Cerebral infarction, unspecified: Secondary | ICD-10-CM

## 2018-03-22 DIAGNOSIS — M6281 Muscle weakness (generalized): Secondary | ICD-10-CM

## 2018-03-22 DIAGNOSIS — R2689 Other abnormalities of gait and mobility: Secondary | ICD-10-CM | POA: Diagnosis not present

## 2018-03-22 DIAGNOSIS — M79652 Pain in left thigh: Secondary | ICD-10-CM

## 2018-03-22 DIAGNOSIS — R2681 Unsteadiness on feet: Secondary | ICD-10-CM | POA: Diagnosis not present

## 2018-03-22 NOTE — Therapy (Signed)
[facility] [address] [place], [place], [address] Phone: [phone]   Fax:  [phone]  Physical Therapy Treatment  Patient Details  Name: Kaitlin [name] MRN: [id number] Date of Birth: 10-22-1945 Referring Provider: Dennie Bible, NP   Encounter Date: 03/22/2018  PT End of Session - 03/22/18 1455    Visit Number  12    Number of Visits  17    Date for PT Re-Evaluation  04/14/18    Authorization Type  [employer]na Medicare    Authorization Time Period  12/27/17 to 12/27/17; 02/13/18 to 04/14/18    PT Start Time  0930    PT Stop Time  1017    PT Time Calculation (min)  47 min    Activity Tolerance  Patient limited by pain    Behavior During Therapy  [facility] for tasks assessed/performed       Past Medical History:  Diagnosis Date  . Abnormality of gait as late effect of stroke 02/23/2017  . Acute hypoxemic respiratory failure ([place])   . Atrial fibrillation with RVR ([place]) 02/17/2017  . CAD S/P percutaneous coronary angioplasty 2001  . CVA (cerebral vascular accident) ([place]) 01/2017  . Dysphagia as late effect of stroke 02/23/2017  . HCVD (hypertensive cardiovascular disease) 01/2017   normal LVF with moderate LVH, grade 1 DD  . High cholesterol   . Hypokalemia   . Labile blood pressure   . Leukocytosis   . Multiple sclerosis ([place])   . PAF (paroxysmal atrial fibrillation) ([place]) 01/2017  . Right hemiparesis [facility])     Past Surgical History:  Procedure Laterality Date  . CORONARY ANGIOPLASTY WITH STENT PLACEMENT  11/1999   RCA DES-[facility]  . IR ANGIO INTRA EXTRACRAN SEL COM CAROTID INNOMINATE UNI R MOD SED  02/15/2017  . IR ANGIO VERTEBRAL SEL SUBCLAVIAN INNOMINATE UNI R MOD SED  02/15/2017  . IR INTRAVSC STENT CERV CAROTID W/O EMB-PROT MOD SED INC ANGIO  02/15/2017  . IR PERCUTANEOUS ART THROMBECTOMY/INFUSION INTRACRANIAL INC DIAG ANGIO  02/15/2017  . IR RADIOLOGIST EVAL & MGMT  05/10/2017  . NECK SURGERY    . RADIOLOGY  WITH ANESTHESIA N/A 02/14/2017   Procedure: RADIOLOGY WITH ANESTHESIA;  Surgeon: Radiologist, Medication, MD;  Location: [facility];  Service: Radiology;  Laterality: N/A;  . WRIST SURGERY      There were no vitals filed for this visit.  Subjective Assessment - 03/22/18 0938    Subjective  Thigh hurt a lot over the weekend and began getting better yesterday. Still sore when up walking. Husband asking about whether he should make an appt with an orthopedist. (Encouraged him to do so)    Patient is accompained by:  Family member   spouse, [name]   Pertinent History  afib, CAD s/p PTCA, MS with gait disorder/memory loss, lt cca and ICA CVA 7/18  Extensive white matter disease; fall 06/2017 with SAH     Limitations  Walking;Standing    How long can you sit comfortably?  unlimited    How long can you stand comfortably?  <1 minute    How long can you walk comfortably?  none    Diagnostic tests  MRI 12/08/17 with no acute changes    Patient Stated Goals  pt unable to state; husband wants pt to be able to walk again without a device    Currently in Pain?  Yes    Pain Score  --   pt has difficulty rating throughout session   Pain  Location  Leg   thigh   Pain Orientation  Left;Proximal;Lateral    Pain Descriptors / Indicators  Stabbing    Pain Type  Acute pain    Pain Onset  More than a month ago    Pain Frequency  Intermittent    Aggravating Factors   weight bearing; resisted hip abd;     Pain Relieving Factors  sitting; advil                       [facility] Adult PT Treatment/Exercise - 03/22/18 0952      Bed Mobility   Bed Mobility  Supine to Sit;Sit to Supine;Rolling Right;Rolling Left    Rolling Right  Independent    Rolling Left  Independent    Supine to Sit  Supervision/Verbal cueing    Sit to Supine  Supervision/Verbal cueing      Transfers   Transfers  Sit to Stand;Stand to Sit    Sit to Stand  5: Supervision    Sit to Stand Details  Verbal cues for sequencing;Verbal cues  for safe use of DME/AE    Stand to Sit  5: Supervision;With upper extremity assist;To chair/3-in-1;To bed    Stand to Sit Details (indicate cue type and reason)  Verbal cues for technique;Verbal cues for safe use of DME/AE      Ambulation/Gait   Ambulation/Gait Assistance  5: Supervision    Ambulation/Gait Assistance Details  slight antalgic pattern; improved proximity to RW    Ambulation Distance (Feet)  80 Feet   40, 30   Assistive device  Rolling walker    Gait Pattern  Step-through pattern;Decreased stride length;Decreased weight shift to left;Antalgic      Exercises   Exercises  Knee/Hip      Knee/Hip Exercises: Stretches   Passive Hamstring Stretch  Left;2 reps   slight burning pain lateral thigh   Piriformis Stretch  Right;Left;1 rep;20 seconds   no incr lateral thigh pain     Knee/Hip Exercises: Aerobic   Stepper  Scifit x 1 minute with left thigh pain as severe as when walking      Knee/Hip Exercises: Seated   Long Arc Quad  Strengthening;Left    Long Arc Quad Limitations  manual resistance to LLE with mild lateral quad pain (anterior leg--not the same as when wt-bearing)    Abduction/Adduction   Strengthening;Both   manual resistance with +pain in lateral thigh      Knee/Hip Exercises: Supine   Heel Slides  AAROM;Left    Bridges  Strengthening;Both;1 set;10 reps   no thigh pain   Straight Leg Raises  Strengthening;Left   no thigh pain     Knee/Hip Exercises: Sidelying   Clams  rt sidelying, left left leg with mild lat thigh pain      Manual Therapy   Manual therapy comments  palpation of left vs right thigh with +edema mid-thigh (more anterior than site of pain) and significant tenderness over left lateral-posterior thigh ~2-3 " below greater trochanter          Balance Exercises - 03/22/18 0953      Balance Exercises: Standing   Standing Eyes Opened  Narrow base of support (BOS);Head turns;Solid surface;Foam/compliant surface    Standing Eyes Closed   Narrow base of support (BOS);Solid surface;Foam/compliant surface    Partial Tandem Stance  Eyes open;Eyes closed;Intermittent upper extremity support;20 secs   left foot forward to decrease pressure on LLE       PT  Education - 03/22/18 1453    Education Details  concern re: still undiagnosed left lateral thigh pain; pattern not consistent with hip arthritis in that she feels better when first up in the morning and worsens as the day progresses    Person(s) Educated  Patient;Spouse    Methods  Explanation;Demonstration    Comprehension  Verbalized understanding;Returned demonstration       PT Short Term Goals - 03/10/18 1929      PT SHORT TERM GOAL #1   Title  Patient (with supervision to min-guard assistance of her spouse) will perform basic HEP for balance and strengthening bil LEs. (Target STGs 03/15/2018)    Time  4    Period  Weeks    Status  Achieved      PT SHORT TERM GOAL #2   Title  Patient will improve gait velocity to >=1.81 Ft/sec to demonstrate decr fall risk    Baseline  03/10/18 with RW 1.47 ft/sec with RW    Time  4    Period  Weeks    Status  Partially Met      PT SHORT TERM GOAL #3   Title  Patient will improve TUG with or without a device to <22 seconds to demonstrate a lesser fall risk.     Baseline  03/10/18 19.28 sec    Time  4    Period  Weeks    Status  Achieved      PT SHORT TERM GOAL #4   Title  Patient will ambulate 400 ft consecutively with LRAD with minguard assist for safety.     Baseline  03/10/18 met    Time  4    Period  Weeks    Status  Achieved        PT Long Term Goals - 02/13/18 1233      PT LONG TERM GOAL #1   Title  Patient (with supervision of husband) will perform updated HEP (Target for all LTGs 04/14/2018)    Time  8    Period  Weeks    Status  New      PT LONG TERM GOAL #2   Title  Patient will improve gait velocity to >=2.3 ft/sec to show progress towards safe community ambulation.     Time  8    Period  Weeks    Status   New      PT LONG TERM GOAL #3   Title  Patient will decr TUG to <18 sec with LRAD     Time  8    Period  Weeks    Status  New            Plan - 03/22/18 1457    Clinical Impression Statement  Left lateral thigh pain continues to be a limting factor in progressing her care. Husband inquired if I thought she shold see an orthopedist (and I agreed she should). Recommended to go very slwoly with any HEP. Again requested he have someone move their recumbent bicycle downstairs where she can more easily use it. Huband in ageement.     Rehab Potential  Good    Clinical Impairments Affecting Rehab Potential  lt thigh pain of unknown origin (has been cleared by MD to return); decr memory    PT Frequency  2x / week    PT Duration  8 weeks    PT Treatment/Interventions  ADLs/Self Care Home Management;Gait training;DME Instruction;Stair training;Functional mobility training;Therapeutic activities;Therapeutic exercise;Balance training;Neuromuscular re-education;Cognitive remediation;Orthotic Fit/Training;Patient/family education;Passive  range of motion;Manual techniques;Dry needling;Moist Heat;Ultrasound;Taping   Plan updated to further address left thigh pain; recertification sent to reflect changes   PT Next Visit Plan  if able, use stepper vs restorator for warm-up (has done up to 9 minutes); check tolerance for activity since last visit; standing balance; gait training RW, LE strengthening. Once leg feeling better, ?try cane    Consulted and Agree with Plan of Care  Patient;Family member/caregiver    Family Member Consulted  [name], husband       Patient will benefit from skilled therapeutic intervention in order to improve the following deficits and impairments:  Decreased activity tolerance, Decreased balance, Decreased cognition, Decreased mobility, Decreased knowledge of use of DME, Decreased endurance, Decreased range of motion, Decreased strength, Difficulty walking, Impaired flexibility,  Postural dysfunction, Pain(TBA if pt cleared for PT)  Visit Diagnosis: Muscle weakness (generalized)  Pain in left thigh     Problem List Patient Active Problem List   Diagnosis Date Noted  . History of stroke 11/30/2017  . Cognitive deficit, post-stroke 09/30/2017  . Subarachnoid hemorrhage ([place]) 06/04/2017  . Multiple sclerosis ([place]) 05/12/2017  . CAD (coronary artery disease) 04/12/2017  . Hypertension 04/11/2017  . Right hemiparesis ([place])   . Aspiration pneumonia of right lung ([place])   . Leukocytosis   . Hypokalemia   . Labile blood pressure   . Abnormality of gait as late effect of stroke 02/23/2017  . Dysarthria as late effect of stroke 02/23/2017  . Dysphagia as late effect of stroke 02/23/2017  . Acute ischemic left middle cerebral artery (MCA) stroke ([place]) 02/22/2017  . Paroxysmal A-fib ([place]) 02/17/2017  . Atrial fibrillation with RVR ([place]) 02/17/2017  . HLD (hyperlipidemia) 02/17/2017  . Acute hypoxemic respiratory failure ([place])   . Stroke (cerebrum) ([facility]) - Acute MCA infarct due to left CCA/ICA and MCA occlusion, s/p mechanical thrombectomy and left ICA stenting in the setting of newly diagnosed afib 02/14/2017  . Atherosclerotic cardiovascular disease 11/07/2013    Rexanne Mano 03/22/2018, 3:05 PM  [facility] [address] [place], [place], [address] Phone: [phone]   Fax:  [phone]  Name: Deasia Chiu MRN: [id number] Date of Birth: November 07, 1945

## 2018-03-22 NOTE — Telephone Encounter (Signed)
Called to schedule f/u us carotid, no answer, left vm. AW 

## 2018-03-24 ENCOUNTER — Ambulatory Visit: Payer: Medicare HMO | Admitting: Physical Therapy

## 2018-03-24 ENCOUNTER — Encounter: Payer: Self-pay | Admitting: Physical Therapy

## 2018-03-24 DIAGNOSIS — R2689 Other abnormalities of gait and mobility: Secondary | ICD-10-CM

## 2018-03-24 DIAGNOSIS — R2681 Unsteadiness on feet: Secondary | ICD-10-CM | POA: Diagnosis not present

## 2018-03-24 DIAGNOSIS — M79652 Pain in left thigh: Secondary | ICD-10-CM | POA: Diagnosis not present

## 2018-03-24 DIAGNOSIS — R748 Abnormal levels of other serum enzymes: Secondary | ICD-10-CM | POA: Diagnosis not present

## 2018-03-24 DIAGNOSIS — M6281 Muscle weakness (generalized): Secondary | ICD-10-CM | POA: Diagnosis not present

## 2018-03-24 NOTE — Patient Instructions (Signed)
Access Code: [email or web address]  URL: [email or web address]  Date: 03/24/2018  Prepared by: Veda Canning   Exercises  Supine Figure 4 Piriformis Stretch - 3 reps - 1 sets - 30 seconds hold - 2x daily - 7x weekly  Supine Sciatic Nerve Glide - 10 reps - 1 sets - 1x daily - 7x weekly

## 2018-03-25 NOTE — Therapy (Signed)
[facility] [address] [place], [place], [address] Phone: [phone]   Fax:  [phone]  Physical Therapy Treatment  Patient Details  Name: Kaitlin [name] MRN: [id number] Date of Birth: 30-Jan-1946 Referring Provider: Dennie Bible, NP   Encounter Date: 03/24/2018  PT End of Session - 03/24/18 1700    Visit Number  13    Number of Visits  17    Date for PT Re-Evaluation  04/14/18    Authorization Type  Aetna Medicare    Authorization Time Period  12/27/17 to 12/27/17; 02/13/18 to 04/14/18    PT Start Time  0941    PT Stop Time  1025    PT Time Calculation (min)  44 min    Activity Tolerance  Patient limited by pain    Behavior During Therapy  [facility] for tasks assessed/performed       Past Medical History:  Diagnosis Date  . Abnormality of gait as late effect of stroke 02/23/2017  . Acute hypoxemic respiratory failure ([place])   . Atrial fibrillation with RVR ([place]) 02/17/2017  . CAD S/P percutaneous coronary angioplasty 2001  . CVA (cerebral vascular accident) ([place]) 01/2017  . Dysphagia as late effect of stroke 02/23/2017  . HCVD (hypertensive cardiovascular disease) 01/2017   normal LVF with moderate LVH, grade 1 DD  . High cholesterol   . Hypokalemia   . Labile blood pressure   . Leukocytosis   . Multiple sclerosis ([place])   . PAF (paroxysmal atrial fibrillation) ([place]) 01/2017  . Right hemiparesis [facility])     Past Surgical History:  Procedure Laterality Date  . CORONARY ANGIOPLASTY WITH STENT PLACEMENT  11/1999   [facility]  . IR ANGIO INTRA EXTRACRAN SEL COM CAROTID INNOMINATE UNI R MOD SED  02/15/2017  . IR ANGIO VERTEBRAL SEL SUBCLAVIAN INNOMINATE UNI R MOD SED  02/15/2017  . IR INTRAVSC STENT CERV CAROTID W/O EMB-PROT MOD SED INC ANGIO  02/15/2017  . IR PERCUTANEOUS ART THROMBECTOMY/INFUSION INTRACRANIAL INC DIAG ANGIO  02/15/2017  . IR RADIOLOGIST EVAL & MGMT  05/10/2017  . NECK SURGERY    . RADIOLOGY  WITH ANESTHESIA N/A 02/14/2017   Procedure: RADIOLOGY WITH ANESTHESIA;  Surgeon: Radiologist, Medication, MD;  Location: [facility];  Service: Radiology;  Laterality: N/A;  . WRIST SURGERY      There were no vitals filed for this visit.  Subjective Assessment - 03/24/18 0944    Subjective  Did get a referral from PCP for orthopedist, but has not heard from their office yet. Not sure which practice she's referred to. Hip is no worse and maybe a little better. Pt reports she also has pain in left lateral lower leg--states it feels similar to thigh pain and has been there as long as the thigh pain has been there.    Patient is accompained by:  Family member   spouse, [name]   Pertinent History  afib, CAD s/p PTCA, MS with gait disorder/memory loss, lt cca and ICA CVA 7/18  Extensive white matter disease; fall 06/2017 with SAH     Limitations  Walking;Standing    How long can you sit comfortably?  unlimited    How long can you stand comfortably?  <1 minute    How long can you walk comfortably?  none    Diagnostic tests  MRI 12/08/17 with no acute changes    Patient Stated Goals  pt unable to state; husband wants pt to be able to walk again without  a device    Currently in Pain?  Yes    Pain Score  2     Pain Location  Hip    Pain Orientation  Left;Proximal;Lateral    Pain Descriptors / Indicators  Stabbing    Pain Type  Acute pain    Pain Onset  More than a month ago    Pain Frequency  Intermittent    Aggravating Factors   weight bearing, resisted abduction, sciatic nerve glides    Pain Relieving Factors  rest, advil    Effect of Pain on Daily Activities  limits her walking                       [facility] Adult PT Treatment/Exercise - 03/24/18 0953      Transfers   Transfers  Sit to Stand;Stand to Sit    Sit to Stand  5: Supervision    Sit to Stand Details (indicate cue type and reason)  cues for safe hand placement 75% of the time    Stand to Sit  5: Supervision;With upper  extremity assist;To chair/3-in-1;To bed    Stand to Sit Details  cues 100% of the time      Ambulation/Gait   Ambulation/Gait Assistance  5: Supervision    Ambulation/Gait Assistance Details  staying closer to RW with slight antalgic gait (less than last visit)    Ambulation Distance (Feet)  100 Feet    Assistive device  Rolling walker    Gait Pattern  Step-through pattern;Decreased stride length;Decreased weight shift to left;Antalgic;Trunk flexed    Ambulation Surface  Indoor      Lumbar Exercises: Stretches   Piriformis Stretch  Left;5 reps;30 seconds   supine, lt ankle across rt knee   Piriformis Stretch Limitations  attempted left knee to rt shoulder and pt very tight, could not tolerate    Other Lumbar Stretch Exercise  supine sciatic nerve glides x 10 (note-+increase pain in lateral thigh and lateral lower leg with sciatic nerve stretch)      Knee/Hip Exercises: Aerobic   Other Aerobic  restorator x 4 minutes forward left hip pain less than walking; pedaling backwards x 2 minutes increased thigh pain; 2 minutes forward and pain remained elevated--up from 2 to 3/10             PT Education - 03/25/18 0748    Education Details  additions to HEP; add recumbent cycle for 5-10 minutes; do not do standing hip exercises or seated marching (causing incr pain)    Person(s) Educated  Patient;Spouse    Methods  Explanation;Demonstration;Handout;Verbal cues;Tactile cues    Comprehension  Verbalized understanding;Returned demonstration;Verbal cues required;Tactile cues required;Need further instruction       PT Short Term Goals - 03/10/18 1929      PT SHORT TERM GOAL #1   Title  Patient (with supervision to min-guard assistance of her spouse) will perform basic HEP for balance and strengthening bil LEs. (Target STGs 03/15/2018)    Time  4    Period  Weeks    Status  Achieved      PT SHORT TERM GOAL #2   Title  Patient will improve gait velocity to >=1.81 Ft/sec to demonstrate  decr fall risk    Baseline  03/10/18 with RW 1.47 ft/sec with RW    Time  4    Period  Weeks    Status  Partially Met      PT SHORT TERM GOAL #3  Title  Patient will improve TUG with or without a device to <22 seconds to demonstrate a lesser fall risk.     Baseline  03/10/18 19.28 sec    Time  4    Period  Weeks    Status  Achieved      PT SHORT TERM GOAL #4   Title  Patient will ambulate 400 ft consecutively with LRAD with minguard assist for safety.     Baseline  03/10/18 met    Time  4    Period  Weeks    Status  Achieved        PT Long Term Goals - 02/13/18 1233      PT LONG TERM GOAL #1   Title  Patient (with supervision of husband) will perform updated HEP (Target for all LTGs 04/14/2018)    Time  8    Period  Weeks    Status  New      PT LONG TERM GOAL #2   Title  Patient will improve gait velocity to >=2.3 ft/sec to show progress towards safe community ambulation.     Time  8    Period  Weeks    Status  New      PT LONG TERM GOAL #3   Title  Patient will decr TUG to <18 sec with LRAD     Time  8    Period  Weeks    Status  New            Plan - 03/24/18 1700    Clinical Impression Statement  Patient's LLE pain continues to be a limiting factor in all activities. Today she noted left lower leg pain that she then reports has been present the entire time her thigh pain has been present. (Patient's cognitive deficits make it difficult to assess her pain). Assessed for left sciatic nerve pain and pt with +sciatic nerve stretch on left (negative on right). Lt piriformis found to be much tighter than right. Updated HEP to address these deficits. Patient is also waiting for an orthopedist appointment.     Rehab Potential  Good    Clinical Impairments Affecting Rehab Potential  lt thigh pain of unknown origin (has been cleared by MD to return); decr memory    PT Frequency  2x / week    PT Duration  8 weeks    PT Treatment/Interventions  ADLs/Self Care Home  Management;Gait training;DME Instruction;Stair training;Functional mobility training;Therapeutic activities;Therapeutic exercise;Balance training;Neuromuscular re-education;Cognitive remediation;Orthotic Fit/Training;Patient/family education;Passive range of motion;Manual techniques;Dry needling;Moist Heat;Ultrasound;Taping   Plan updated to further address left thigh pain; recertification sent to reflect changes   PT Next Visit Plan  ?need to add appts; ? go on hold until ortho appt; check tolerance for activity since last visit; restorator for warm-up; reassess left sciatic nerve glide and piriformis stretch (any improvement?); gait training RW, LE strengthening. Once leg feeling better, ?try cane    Consulted and Agree with Plan of Care  Patient;Family member/caregiver    Family Member Consulted  [name], husband       Patient will benefit from skilled therapeutic intervention in order to improve the following deficits and impairments:  Decreased activity tolerance, Decreased balance, Decreased cognition, Decreased mobility, Decreased knowledge of use of DME, Decreased endurance, Decreased range of motion, Decreased strength, Difficulty walking, Impaired flexibility, Postural dysfunction, Pain(TBA if pt cleared for PT)  Visit Diagnosis: Pain in left thigh  Other abnormalities of gait and mobility     Problem List Patient  Active Problem List   Diagnosis Date Noted  . History of stroke 11/30/2017  . Cognitive deficit, post-stroke 09/30/2017  . Subarachnoid hemorrhage ([place]) 06/04/2017  . Multiple sclerosis ([place]) 05/12/2017  . CAD (coronary artery disease) 04/12/2017  . Hypertension 04/11/2017  . Right hemiparesis ([place])   . Aspiration pneumonia of right lung ([place])   . Leukocytosis   . Hypokalemia   . Labile blood pressure   . Abnormality of gait as late effect of stroke 02/23/2017  . Dysarthria as late effect of stroke 02/23/2017  . Dysphagia as late effect of stroke 02/23/2017  . Acute  ischemic left middle cerebral artery (MCA) stroke ([place]) 02/22/2017  . Paroxysmal A-fib ([place]) 02/17/2017  . Atrial fibrillation with RVR ([place]) 02/17/2017  . HLD (hyperlipidemia) 02/17/2017  . Acute hypoxemic respiratory failure ([place])   . Stroke (cerebrum) ([facility]) - Acute MCA infarct due to left CCA/ICA and MCA occlusion, s/p mechanical thrombectomy and left ICA stenting in the setting of newly diagnosed afib 02/14/2017  . Atherosclerotic cardiovascular disease 11/07/2013    Rexanne Mano, PT 03/25/2018, 8:02 AM  [facility] [address] [place], [place], [address] Phone: [phone]   Fax:  [phone]  Name: Kaitlin [name] MRN: [id number] Date of Birth: [birth year]

## 2018-03-27 ENCOUNTER — Telehealth: Payer: Self-pay | Admitting: Cardiology

## 2018-03-27 DIAGNOSIS — R932 Abnormal findings on diagnostic imaging of liver and biliary tract: Secondary | ICD-10-CM

## 2018-03-27 DIAGNOSIS — Z789 Other specified health status: Secondary | ICD-10-CM

## 2018-03-27 DIAGNOSIS — R748 Abnormal levels of other serum enzymes: Secondary | ICD-10-CM

## 2018-03-27 NOTE — Telephone Encounter (Signed)
New Message   Patsy Lager is calling to confirm we received what she sent about the abnormal liver test the pt had and says she sent 4 sets of labs and an ultrasound. States Dr. Felipa Eth will be out of the office all week but it can be discussed with her. Please call

## 2018-03-27 NOTE — Telephone Encounter (Signed)
Returned call to [place] at Dr. Vicente Males office. She states that she faxed over lab results and liver ultrasound results for this patient. Made her aware that this information has not been received yet. Provided fax # [phone] for her to resend the results.   Patsy Lager states that the patient's liver enzymes remain elevated. She states that the patient has been off of her statin since 02/17/18. Will forward to Dr. Lindaann Slough nurse to follow.

## 2018-03-28 DIAGNOSIS — M1612 Unilateral primary osteoarthritis, left hip: Secondary | ICD-10-CM | POA: Diagnosis not present

## 2018-03-29 ENCOUNTER — Encounter: Payer: Self-pay | Admitting: Physical Therapy

## 2018-03-29 ENCOUNTER — Ambulatory Visit: Payer: Medicare HMO | Admitting: Physical Therapy

## 2018-03-29 DIAGNOSIS — M79652 Pain in left thigh: Secondary | ICD-10-CM

## 2018-03-29 DIAGNOSIS — M6281 Muscle weakness (generalized): Secondary | ICD-10-CM

## 2018-03-29 DIAGNOSIS — R2689 Other abnormalities of gait and mobility: Secondary | ICD-10-CM | POA: Diagnosis not present

## 2018-03-29 DIAGNOSIS — R2681 Unsteadiness on feet: Secondary | ICD-10-CM | POA: Diagnosis not present

## 2018-03-29 NOTE — Therapy (Signed)
[facility] [address] [place], [place], [address] Phone: [phone]   Fax:  [phone]  Physical Therapy Treatment  Patient Details  Name: Kaitlin [name] MRN: [id number] Date of Birth: Nov 15, 1945 Referring Provider: Dennie Bible, NP   Encounter Date: 03/29/2018  PT End of Session - 03/29/18 1601    Visit Number  14    Number of Visits  17    Date for PT Re-Evaluation  04/14/18    Authorization Type  [employer]    Authorization Time Period  12/27/17 to 12/27/17; 02/13/18 to 04/14/18    PT Start Time  0940    PT Stop Time  1020    PT Time Calculation (min)  40 min    Activity Tolerance  Patient tolerated treatment well    Behavior During Therapy  [facility] for tasks assessed/performed       Past Medical History:  Diagnosis Date  . Abnormality of gait as late effect of stroke 02/23/2017  . Acute hypoxemic respiratory failure ([place])   . Atrial fibrillation with RVR ([place]) 02/17/2017  . CAD S/P percutaneous coronary angioplasty 2001  . CVA (cerebral vascular accident) ([place]) 01/2017  . Dysphagia as late effect of stroke 02/23/2017  . HCVD (hypertensive cardiovascular disease) 01/2017   normal LVF with moderate LVH, grade 1 DD  . High cholesterol   . Hypokalemia   . Labile blood pressure   . Leukocytosis   . Multiple sclerosis ([place])   . PAF (paroxysmal atrial fibrillation) ([place]) 01/2017  . Right hemiparesis [facility])     Past Surgical History:  Procedure Laterality Date  . CORONARY ANGIOPLASTY WITH STENT PLACEMENT  11/1999   RCA [facility]  . IR ANGIO INTRA EXTRACRAN SEL COM CAROTID INNOMINATE UNI R MOD SED  02/15/2017  . IR ANGIO VERTEBRAL SEL SUBCLAVIAN INNOMINATE UNI R MOD SED  02/15/2017  . IR INTRAVSC STENT CERV CAROTID W/O EMB-PROT MOD SED INC ANGIO  02/15/2017  . IR PERCUTANEOUS ART THROMBECTOMY/INFUSION INTRACRANIAL INC DIAG ANGIO  02/15/2017  . IR RADIOLOGIST EVAL & MGMT  05/10/2017  . NECK SURGERY    .  RADIOLOGY WITH ANESTHESIA N/A 02/14/2017   Procedure: RADIOLOGY WITH ANESTHESIA;  Surgeon: Radiologist, Medication, MD;  Location: [place];  Service: Radiology;  Laterality: N/A;  . WRIST SURGERY      There were no vitals filed for this visit.  Subjective Assessment - 03/29/18 0941    Subjective  Saw orthopedist and they want to do injection to her hip. Hopefully next week. Has been able to walk up the steps and do the recumbent bike for 9 minutes. Has noticed the pain gets worse if she is still too long and then gets up to move OR if she walks too much.     Patient is accompained by:  Family member   spouse, Kaitlin [name]   Pertinent History  afib, CAD s/p PTCA, MS with gait disorder/memory loss, lt cca and ICA CVA 7/18  Extensive white matter disease; fall 06/2017 with SAH     Limitations  Walking;Standing    How long can you sit comfortably?  unlimited    How long can you stand comfortably?  <1 minute    How long can you walk comfortably?  none    Diagnostic tests  MRI 12/08/17 with no acute changes    Patient Stated Goals  pt unable to state; husband wants pt to be able to walk again without a device    Currently in  Pain?  Yes    Pain Score  2     Pain Location  --   thigh worse than lower leg   Pain Orientation  Left    Pain Descriptors / Indicators  Stabbing    Pain Type  Acute pain    Pain Radiating Towards  lateral thigh and outer lower leg    Pain Onset  More than a month ago    Pain Frequency  Intermittent                       [facility] Adult PT Treatment/Exercise - 03/29/18 0001      Ambulation/Gait   Ambulation/Gait Assistance  5: Supervision    Ambulation/Gait Assistance Details  vc for staying closer to RW with slight antalgic gait (less than last visit)    Ambulation Distance (Feet)  80 Feet    Assistive device  Rolling walker    Gait Pattern  Step-through pattern;Decreased stride length;Decreased weight shift to left;Antalgic;Trunk flexed    Ambulation Surface   Indoor      Knee/Hip Exercises: Stretches   Other Knee/Hip Stretches  distraction to left hip in supine hands on distal femur; 30 sec x 2 with +relief      Knee/Hip Exercises: Aerobic   Other Aerobic  restorator x 4 minutes fwd with lateral thigh pain incr from 2 to 3/10      Knee/Hip Exercises: Supine   [employer]  Strengthening;AROM;Left;3 sets;10 reps   no wt; 2#, 3#   Heel Slides  AAROM;Left;1 set;10 reps   mid-range to reduce painful ROM   Straight Leg Raises  AAROM;Left;1 set    Other Supine Knee/Hip Exercises  hooklying left hip abdct/butterfly only to ~45 degrees with min resistance to adduction  x 10 reps      Manual Therapy   Joint Mobilization  grade 1-II midrange hip flex/extension in supine    Myofascial Release  left IT band    Manual Traction  LLE using femoral condyles x 30 sec x 2             PT Education - 03/29/18 1558    Education Details  continue recumbent bike at home and LLE stretches; can add massage to IT band (pt in sidelying with pillow between knees and husband using heated massager they have)    Person(s) Educated  Patient;Spouse    Methods  Explanation;Demonstration    Comprehension  Verbalized understanding       PT Short Term Goals - 03/10/18 1929      PT SHORT TERM GOAL #1   Title  Patient (with supervision to min-guard assistance of her spouse) will perform basic HEP for balance and strengthening bil LEs. ([employer] STGs 03/15/2018)    Time  4    Period  Weeks    Status  Achieved      PT SHORT TERM GOAL #2   Title  Patient will improve gait velocity to >=1.81 Ft/sec to demonstrate decr fall risk    Baseline  03/10/18 with RW 1.47 ft/sec with RW    Time  4    Period  Weeks    Status  Partially Met      PT SHORT TERM GOAL #3   Title  Patient will improve TUG with or without a device to <22 seconds to demonstrate a lesser fall risk.     Baseline  03/10/18 19.28 sec    Time  4  Period  Weeks    Status  Achieved      PT SHORT  TERM GOAL #4   Title  Patient will ambulate 400 ft consecutively with LRAD with minguard assist for safety.     Baseline  03/10/18 met    Time  4    Period  Weeks    Status  Achieved        PT Long Term Goals - 02/13/18 1233      PT LONG TERM GOAL #1   Title  Patient (with supervision of husband) will perform updated HEP (Target for all LTGs 04/14/2018)    Time  8    Period  Weeks    Status  New      PT LONG TERM GOAL #2   Title  Patient will improve gait velocity to >=2.3 ft/sec to show progress towards safe community ambulation.     Time  8    Period  Weeks    Status  New      PT LONG TERM GOAL #3   Title  Patient will decr TUG to <18 sec with LRAD     Time  8    Period  Weeks    Status  New            Plan - 03/29/18 6962    Clinical Impression Statement  LLE pain better today, but remains. Orthopedist plans to inject her left hip, per husband. Tolerated increased resistance to supine exercises and increased stretching to IT band with less pain. Anticipate pt will continue to make gains and benefit from PT, although has been slowed by left hip pain.     Rehab Potential  Good    Clinical Impairments Affecting Rehab Potential  lt thigh pain of unknown origin (has been cleared by MD to return); decr memory    PT Frequency  2x / week    PT Duration  8 weeks    PT Treatment/Interventions  ADLs/Self Care Home Management;Gait training;DME Instruction;Stair training;Functional mobility training;Therapeutic activities;Therapeutic exercise;Balance training;Neuromuscular re-education;Cognitive remediation;Orthotic Fit/Training;Patient/family education;Passive range of motion;Manual techniques;Dry needling;Moist Heat;Ultrasound;Taping   Plan updated to further address left thigh pain; recertification sent to reflect changes   PT Next Visit Plan  recommend add 2 wks of appts (last is 9/13); check tolerance for activity since last visit; restorator for warm-up; reassess left sciatic  nerve glide and piriformis stretch (any improvement?) gait training RW, LE strengthening. Once leg feeling better, ?try cane    Consulted and Agree with Plan of Care  Patient;Family member/caregiver    Family Member Consulted  Kaitlin [name], husband       Patient will benefit from skilled therapeutic intervention in order to improve the following deficits and impairments:  Decreased activity tolerance, Decreased balance, Decreased cognition, Decreased mobility, Decreased knowledge of use of DME, Decreased endurance, Decreased range of motion, Decreased strength, Difficulty walking, Impaired flexibility, Postural dysfunction, Pain(TBA if pt cleared for PT)  Visit Diagnosis: Pain in left thigh  Other abnormalities of gait and mobility  Muscle weakness (generalized)     Problem List Patient Active Problem List   Diagnosis Date Noted  . History of stroke 11/30/2017  . Cognitive deficit, post-stroke 09/30/2017  . Subarachnoid hemorrhage ([place]) 06/04/2017  . Multiple sclerosis ([place]) 05/12/2017  . CAD (coronary artery disease) 04/12/2017  . Hypertension 04/11/2017  . Right hemiparesis ([place])   . Aspiration pneumonia of right lung ([place])   . Leukocytosis   . Hypokalemia   . Labile blood pressure   .  Abnormality of gait as late effect of stroke 02/23/2017  . Dysarthria as late effect of stroke 02/23/2017  . Dysphagia as late effect of stroke 02/23/2017  . Acute ischemic left middle cerebral artery (MCA) stroke ([place]) 02/22/2017  . Paroxysmal A-fib ([place]) 02/17/2017  . Atrial fibrillation with RVR ([place]) 02/17/2017  . HLD (hyperlipidemia) 02/17/2017  . Acute hypoxemic respiratory failure ([place])   . Stroke (cerebrum) (HCC) - Acute MCA infarct due to left CCA/ICA and MCA occlusion, s/p mechanical thrombectomy and left ICA stenting in the setting of newly diagnosed afib 02/14/2017  . Atherosclerotic cardiovascular disease 11/07/2013    Rexanne Mano, PT 03/29/2018, 4:08 PM  [facility] [address] [place], [place], [address] Phone: [phone]   Fax:  [phone]  Name: Kaitlin [name] MRN: [id number] Date of Birth: 02-18-1946

## 2018-03-30 ENCOUNTER — Encounter: Payer: Self-pay | Admitting: Psychology

## 2018-03-30 ENCOUNTER — Encounter: Payer: Medicare HMO | Attending: Physical Medicine & Rehabilitation | Admitting: Psychology

## 2018-03-30 ENCOUNTER — Ambulatory Visit: Payer: Medicare HMO | Admitting: Psychology

## 2018-03-30 DIAGNOSIS — I69351 Hemiplegia and hemiparesis following cerebral infarction affecting right dominant side: Secondary | ICD-10-CM | POA: Diagnosis not present

## 2018-03-30 DIAGNOSIS — I69311 Memory deficit following cerebral infarction: Secondary | ICD-10-CM | POA: Diagnosis not present

## 2018-03-30 DIAGNOSIS — J9601 Acute respiratory failure with hypoxia: Secondary | ICD-10-CM | POA: Insufficient documentation

## 2018-03-30 DIAGNOSIS — I480 Paroxysmal atrial fibrillation: Secondary | ICD-10-CM | POA: Diagnosis not present

## 2018-03-30 DIAGNOSIS — Z8249 Family history of ischemic heart disease and other diseases of the circulatory system: Secondary | ICD-10-CM | POA: Insufficient documentation

## 2018-03-30 DIAGNOSIS — Z9889 Other specified postprocedural states: Secondary | ICD-10-CM | POA: Insufficient documentation

## 2018-03-30 DIAGNOSIS — Z955 Presence of coronary angioplasty implant and graft: Secondary | ICD-10-CM | POA: Diagnosis not present

## 2018-03-30 DIAGNOSIS — G35 Multiple sclerosis: Secondary | ICD-10-CM | POA: Diagnosis not present

## 2018-03-30 DIAGNOSIS — I69319 Unspecified symptoms and signs involving cognitive functions following cerebral infarction: Secondary | ICD-10-CM | POA: Diagnosis not present

## 2018-03-30 DIAGNOSIS — I69391 Dysphagia following cerebral infarction: Secondary | ICD-10-CM | POA: Insufficient documentation

## 2018-03-30 DIAGNOSIS — I270 Primary pulmonary hypertension: Secondary | ICD-10-CM | POA: Diagnosis not present

## 2018-03-30 DIAGNOSIS — R413 Other amnesia: Secondary | ICD-10-CM | POA: Diagnosis present

## 2018-03-30 DIAGNOSIS — Z809 Family history of malignant neoplasm, unspecified: Secondary | ICD-10-CM | POA: Insufficient documentation

## 2018-03-30 NOTE — Progress Notes (Signed)
Neuropsychological Evaluation  Patient:  Kaitlin [name]   DOB: 1946/05/08  MR Number: [id number]  Location: [facility]ICINE [facility]ILITATION [address] [place], [address] [id number] [facility] [place] [address] Dept: [phone]  Start: 12:30 PM End: 1:30 PM  Provider/Observer:     Hershal Coria PsyD  Chief Complaint:      Chief Complaint  Patient presents with  . Memory Loss    Reason For Service:      Irva Moralas Caucasian female referred neuropsychological evaluation due to short-term memory difficulties.  The patient has a 20-year history of multiple sclerosis but more than 10 years without an MS exacerbation.  She was recently taken off her MS medications due to changes in her liver.  The patient also had a stroke in July 2018.  The patient reports that there is questions about whether her memory problems are due to to an exacerbation of her MS versus resulting impact of her stroke.  Patient reports that she has difficulty remembering what she did the day before and cannot remember what she had for dinner the night before.  The patient reports that her sleep patterns are good and appetite are good.  The patient reports that she cannot be left alone and does have home care help 10 to 12 hours/week.  Most of these changes and difficulties have occurred past year.  The patient reports that she has gone 10 years without any MS exacerbation.  She was taken off of her MS medications due to changes in her liver.  However, the patient had a stroke on 716/2018 that resulted in speech difficulty progressing to right side weakness later in the day.  The patient had evidence of a infarct in the left insular and left posterior putamen with occlusion of the L-CCA and L-ICA blood vessels.  She underwent cerebral angio complete revascularization of occluded L-MCA.  Follow-up MRI revealed acute fluid infarct involving  the left vitamin, caudate body and corona radiata, 2 tiny cortical infarcts in the left MCA distribution extensive white matter disease due to MS and chronic microvascular ischemia.    Testing Administered:  RBANS A  Participation Level:   Active  Participation Quality:  Appropriate and Attentive      Behavioral Observation:  Well Groomed, Alert, and Appropriate.   Test Results:       RBANS Update Form A Total Scale 64  The patient's overall global performance on this neuropsychological test battery did suggest significant neuropsychological deficits and global impairment.  Her performance falls in the extremely low range of overall neuropsychological/cognitive functioning.  While the patient's attention and concentration and visual-spatial abilities appear to be quite good there are specific memory deficits both immediate and delayed identified both auditory memory as well as visual memory.     RBANS Update Form A Immediate Memory 57  The patient indicated displayed severe deficits with regard to immediate memory and learning.  The patient's initial encoding was quite good but she had great difficulty learning complex and even simple verbal information and retaining that information for very brief periods of time.        RBANS Update Form A Visuospatial/ Constructional 96  The patient's performance on visual-spatial/visual constructional task was quite good and within the average range.  There were no indications of visual spatial deficits.  The patient also showed good information processing speed as well.       RBANS Update Form A Attention 91  The patient is performance for attention and concentration measures was also in the average range although at the lower end of the average range.  The patient's auditory encoding and simple auditory registration along with visual scanning and processing speed were in the average range.         RBANS Update Form A Language 60  The patient's  performance on verbal fluency task and expressive language functioning were significantly and severely impaired.  The patient had great difficulty with word finding deficits and verbal fluency deficits.  There did not appear to be any problems with articulation or targeted naming but more with verbal fluency and other expressive language deficits.    RBANS Update Form A Delayed Memory 52  As was expected given the significant deficits with regard to her immediate memory the patient also showed significant severe deficits with regard to delayed memory.  These deficits were seen both 4 auditory as well as visual delayed memory functions.  These measures look at delayed recall and recognition of both verbal and visual information.  Summary of Results:   The results of the current neuropsychological evaluation are consistent with significant global neuropsychological deficits.  These deficits are primarily in the areas of learning and memory and recall for both visual and auditory information.  There also significant deficits with regard to expressive language functioning.  The patient displayed good performance with regard to visual spatial/visual constructional abilities as well as attention and concentration measures.  Impression/Diagnosis:   The patient does have a complicated diagnostic question regarding the etiological factors of her current memory and language deficits and the relationship to her chronic long-term management of her MS versus deficits caused by a recent stroke as well as indications of chronic microvascular ischemia.  I do think that the recent changes related to her expressive language and motor functioning deficits are likely directly related to the stroke she had in 2018.  However, the memory deficits and the pattern of memory deficits are very likely to be a combination of both the stroke as well as an ongoing pattern likely related to chronic microvascular ischemia.  While her  MS/demyelinating disorder would also be consistent with these difficulty storing and retrieving information after period of delay because of its negative impact on white matter tracts she also has extensive white matter disease because of chronic microvascular ischemia.  Both of these conditions would be producing problems with regard to white matter tracts explaining her storage and retrieval of new information.  Differentiating these 2 factors would be very difficult but any acute recent changes are likely related to her stroke and/or ongoing microvascular ischemia.   Diagnosis:    Axis I: Cognitive deficit, post-stroke  Multiple sclerosis, relapsing-remitting (HCC)  Hemiparesis affecting right side as late effect of cerebrovascular accident (CVA) (HCC)  Cognitive deficit due to recent stroke   Arley Phenix, Psy.D. Neuropsychologist

## 2018-03-30 NOTE — Telephone Encounter (Signed)
Dr. Delton See. Dr Felipa Eth faxed over some paperwork on this pt for you to review and advise on.  Paperwork consist of the pts most recent labs (particularly liver function) and her liver US.  Per Dr Felipa Eth, he would like for you to look at all results sent, for the pts liver enzymes continue to be elevated, despite her being off of alcohol and statins.   Her liver US is consistent with fatty infiltration and inflammation.  Pt is on amiodarone. Per Dr. Felipa Eth PCP, he is questioning the relationship to amiodarone use.  Dr Delton See, paperwork will be in your folder to review in clinic tomorrow.  Please advise accordingly thereafter.

## 2018-03-30 NOTE — Telephone Encounter (Signed)
Left a message on Kaitlin [name]'s VM stating that we have not received the fax she sent on 8/26.  Algis Liming to send this information to fax number [phone] ATTN: Dr. Delton See and Lajoyce Corners.  Advised Kaitlin [name] to call back with any questions or concerns regarding message left.

## 2018-03-31 ENCOUNTER — Encounter: Payer: Self-pay | Admitting: Physical Therapy

## 2018-03-31 ENCOUNTER — Ambulatory Visit: Payer: Medicare HMO | Admitting: Physical Therapy

## 2018-03-31 DIAGNOSIS — M79652 Pain in left thigh: Secondary | ICD-10-CM

## 2018-03-31 DIAGNOSIS — R2681 Unsteadiness on feet: Secondary | ICD-10-CM

## 2018-03-31 DIAGNOSIS — M6281 Muscle weakness (generalized): Secondary | ICD-10-CM

## 2018-03-31 DIAGNOSIS — R2689 Other abnormalities of gait and mobility: Secondary | ICD-10-CM

## 2018-03-31 NOTE — Telephone Encounter (Signed)
Her LFT elevations are concerning, I agree with discontinuations of statins, I would repeat her LFTs in 1 months, and have her primary doctor evaluate her for other causes such as hepatitis, I would also like to refer her to our lipid clinic, she needs to be on lipid therapy considering she had stenting to the left main.  [place], please refer to [employer].

## 2018-03-31 NOTE — Therapy (Signed)
[facility] [address] [place], [place], [address] Phone: [phone]   Fax:  [phone]  Physical Therapy Treatment  Patient Details  Name: Kaitlin [name] MRN: [id number] Date of Birth: 1945-08-05 Referring Provider: Dennie Bible, NP   Encounter Date: 03/31/2018  PT End of Session - 03/31/18 1017    Visit Number  15    Number of Visits  17    Date for PT Re-Evaluation  04/14/18    Authorization Type  [employer]na Medicare    Authorization Time Period  12/27/17 to 12/27/17; 02/13/18 to 04/14/18    PT Start Time  0930    PT Stop Time  1012    PT Time Calculation (min)  42 min    Activity Tolerance  Patient tolerated treatment well;Patient limited by pain   pain "grabbed" her during balance exercises attempting partial tandem   Behavior During Therapy  [facility] for tasks assessed/performed       Past Medical History:  Diagnosis Date  . Abnormality of gait as late effect of stroke 02/23/2017  . Acute hypoxemic respiratory failure ([place])   . Atrial fibrillation with RVR ([place]) 02/17/2017  . CAD S/P percutaneous coronary angioplasty 2001  . CVA (cerebral vascular accident) ([place]) 01/2017  . Dysphagia as late effect of stroke 02/23/2017  . HCVD (hypertensive cardiovascular disease) 01/2017   normal LVF with moderate LVH, grade 1 DD  . High cholesterol   . Hypokalemia   . Labile blood pressure   . Leukocytosis   . Multiple sclerosis ([place])   . PAF (paroxysmal atrial fibrillation) ([place]) 01/2017  . Right hemiparesis [facility])     Past Surgical History:  Procedure Laterality Date  . CORONARY ANGIOPLASTY WITH STENT PLACEMENT  11/1999   RCA DES-[facility]  . IR ANGIO INTRA EXTRACRAN SEL COM CAROTID INNOMINATE UNI R MOD SED  02/15/2017  . IR ANGIO VERTEBRAL SEL SUBCLAVIAN INNOMINATE UNI R MOD SED  02/15/2017  . IR INTRAVSC STENT CERV CAROTID W/O EMB-PROT MOD SED INC ANGIO  02/15/2017  . IR PERCUTANEOUS ART THROMBECTOMY/INFUSION  INTRACRANIAL INC DIAG ANGIO  02/15/2017  . IR RADIOLOGIST EVAL & MGMT  05/10/2017  . NECK SURGERY    . RADIOLOGY WITH ANESTHESIA N/A 02/14/2017   Procedure: RADIOLOGY WITH ANESTHESIA;  Surgeon: Radiologist, Medication, MD;  Location: [place];  Service: Radiology;  Laterality: N/A;  . WRIST SURGERY      There were no vitals filed for this visit.  Subjective Assessment - 03/31/18 0933    Subjective  Has not heard from orthopedic office yet to schedule injection. Husband plans to call today.     Patient is accompained by:  Family member   spouse, Kaitlin [name]   Pertinent History  afib, CAD s/p PTCA, MS with gait disorder/memory loss, lt cca and ICA CVA 7/18  Extensive white matter disease; fall 06/2017 with SAH     Limitations  Walking;Standing    How long can you sit comfortably?  unlimited    How long can you stand comfortably?  <1 minute    How long can you walk comfortably?  none    Diagnostic tests  MRI 12/08/17 with no acute changes    Patient Stated Goals  pt unable to state; husband wants pt to be able to walk again without a device    Currently in Pain?  Yes    Pain Score  3     Pain Location  Leg    Pain Orientation  Left  Pain Descriptors / Indicators  Tender    Pain Type  Acute pain    Pain Radiating Towards  lateral thigh and outer lower leg    Pain Onset  More than a month ago    Pain Frequency  Constant                       [facility] Adult PT Treatment/Exercise - 03/31/18 0951      Transfers   Transfers  Sit to Stand;Stand to Sit    Sit to Stand  5: Supervision    Sit to Stand Details (indicate cue type and reason)  cues for safe hand placement 90% of the time    Stand to Sit  5: Supervision;With upper extremity assist;To chair/3-in-1;To bed    Stand to Sit Details  cues 100% of the time      Ambulation/Gait   Ambulation/Gait Assistance  5: Supervision    Ambulation/Gait Assistance Details  vc for maintaining straight path     Ambulation Distance (Feet)  400  Feet    Assistive device  Rolling walker    Gait Pattern  Step-through pattern;Decreased stride length;Decreased weight shift to left;Antalgic;Trunk flexed    Ambulation Surface  Indoor    Gait Comments  up walking consecutively for 9 minutes (covering 400 ft) with LLE pain increasing 3 to 4/10      Knee/Hip Exercises: Aerobic   Other Aerobic  restorator x 2 minutes with pain up to 4/10          Balance Exercises - 03/31/18 1009      Balance Exercises: Standing   Standing Eyes Opened  Wide (BOA);Head turns;Solid surface   no UE support; attempted LLE fwd partial tandem with incr pa   Standing Eyes Closed  Wide (BOA);Solid surface;30 secs   no UE s       PT Education - 03/31/18 1014    Education Details  make time for cycling and stretches over the weekend    Person(s) Educated  Patient;Spouse    Methods  Explanation    Comprehension  Verbalized understanding       PT Short Term Goals - 03/10/18 1929      PT SHORT TERM GOAL #1   Title  Patient (with supervision to min-guard assistance of her spouse) will perform basic HEP for balance and strengthening bil LEs. (Target STGs 03/15/2018)    Time  4    Period  Weeks    Status  Achieved      PT SHORT TERM GOAL #2   Title  Patient will improve gait velocity to >=1.81 Ft/sec to demonstrate decr fall risk    Baseline  03/10/18 with RW 1.47 ft/sec with RW    Time  4    Period  Weeks    Status  Partially Met      PT SHORT TERM GOAL #3   Title  Patient will improve TUG with or without a device to <22 seconds to demonstrate a lesser fall risk.     Baseline  03/10/18 19.28 sec    Time  4    Period  Weeks    Status  Achieved      PT SHORT TERM GOAL #4   Title  Patient will ambulate 400 ft consecutively with LRAD with minguard assist for safety.     Baseline  03/10/18 met    Time  4    Period  Weeks    Status  Achieved  PT Long Term Goals - 02/13/18 1233      PT LONG TERM GOAL #1   Title  Patient (with supervision  of husband) will perform updated HEP (Target for all LTGs 04/14/2018)    Time  8    Period  Weeks    Status  New      PT LONG TERM GOAL #2   Title  Patient will improve gait velocity to >=2.3 ft/sec to show progress towards safe community ambulation.     Time  8    Period  Weeks    Status  New      PT LONG TERM GOAL #3   Title  Patient will decr TUG to <18 sec with LRAD     Time  8    Period  Weeks    Status  New            Plan - 03/31/18 1018    Clinical Impression Statement  Attempted to increase time on her LLE today with pt's pain initially at a 3/10 and pt instructed we would continue until pain reached 5/10. After 400 ft walking with RW, pain only had incr to 4/10. With corner balance exercises, she did well until attempted partial tandem stance with LLE forward. Patient flinched twice when attempted and deferred further standing activities. Concluded with seated rest followed by seated use of restorator with no decrease in LLE pain with gentle movement. May need to place pt on hold until her pain is better managed? Hopefful she will get in to have her injection soon with positive results.     Rehab Potential  Good    Clinical Impairments Affecting Rehab Potential  lt thigh pain of unknown origin (has been cleared by MD to return); decr memory    PT Frequency  2x / week    PT Duration  8 weeks    PT Treatment/Interventions  ADLs/Self Care Home Management;Gait training;DME Instruction;Stair training;Functional mobility training;Therapeutic activities;Therapeutic exercise;Balance training;Neuromuscular re-education;Cognitive remediation;Orthotic Fit/Training;Patient/family education;Passive range of motion;Manual techniques;Dry needling;Moist Heat;Ultrasound;Taping   Plan updated to further address left thigh pain; recertification sent to reflect changes   PT Next Visit Plan  check tolerance for activity since last visit; restorator for warm-up; reassess left sciatic nerve glide  and piriformis stretch (did they do these?) gait training RW, LE strengthening. Once leg feeling better, ?try cane    Consulted and Agree with Plan of Care  Patient;Family member/caregiver    Family Member Consulted  Kaitlin [name], husband       Patient will benefit from skilled therapeutic intervention in order to improve the following deficits and impairments:  Decreased activity tolerance, Decreased balance, Decreased cognition, Decreased mobility, Decreased knowledge of use of DME, Decreased endurance, Decreased range of motion, Decreased strength, Difficulty walking, Impaired flexibility, Postural dysfunction, Pain(TBA if pt cleared for PT)  Visit Diagnosis: Pain in left thigh  Other abnormalities of gait and mobility  Muscle weakness (generalized)  Unsteadiness on feet     Problem List Patient Active Problem List   Diagnosis Date Noted  . History of stroke 11/30/2017  . Cognitive deficit, post-stroke 09/30/2017  . Subarachnoid hemorrhage ([place]) 06/04/2017  . Multiple sclerosis ([place]) 05/12/2017  . CAD (coronary artery disease) 04/12/2017  . Hypertension 04/11/2017  . Right hemiparesis ([place])   . Aspiration pneumonia of right lung ([place])   . Leukocytosis   . Hypokalemia   . Labile blood pressure   . Abnormality of gait as late effect of stroke 02/23/2017  .  Dysarthria as late effect of stroke 02/23/2017  . Dysphagia as late effect of stroke 02/23/2017  . Acute ischemic left middle cerebral artery (MCA) stroke ([place]) 02/22/2017  . Paroxysmal A-fib ([place]) 02/17/2017  . Atrial fibrillation with RVR ([place]) 02/17/2017  . HLD (hyperlipidemia) 02/17/2017  . Acute hypoxemic respiratory failure ([place])   . Stroke (cerebrum) ([facility]) - Acute MCA infarct due to left CCA/ICA and MCA occlusion, s/p mechanical thrombectomy and left ICA stenting in the setting of newly diagnosed afib 02/14/2017  . Atherosclerotic cardiovascular disease 11/07/2013    Rexanne Mano, PT 03/31/2018, 1:58 PM  [facility] [address] [place], [place], [address] Phone: [phone]   Fax:  [phone]  Name: Kaitlin [name] MRN: [id number] Date of Birth: 06/22/1946

## 2018-03-31 NOTE — Telephone Encounter (Signed)
Spoke with the pts Husband (on DPR and primary caretaker for the pt), and informed him that Dr Delton See recommends that the pt stop statins, stop amiodarone (Dr Delton See gave a verbal for this discontinuation), repeat the pts LFTs in one month, and follow back up with Dr Felipa Eth, to further evaluate her for other causes such as hepatitis.  Also informed the pts spouse that Dr Delton See recommends that we refer the pt to our lipid clinic for therapy, being she has known CAD and has had stenting to the left main.  Scheduled the pt for repeat LFTs in one month on 05/01/18.  Informed the pts husband that I will place the referral to our lipid clinic in the system, and one of our [facility] schedulers will call him back to have this appt arranged.  Husband verbalized understanding and agrees with this plan.

## 2018-04-04 ENCOUNTER — Encounter: Payer: Self-pay | Admitting: Psychology

## 2018-04-04 ENCOUNTER — Ambulatory Visit: Payer: Medicare HMO | Admitting: Psychology

## 2018-04-04 ENCOUNTER — Encounter: Payer: Medicare HMO | Attending: Physical Medicine & Rehabilitation | Admitting: Psychology

## 2018-04-04 DIAGNOSIS — G35 Multiple sclerosis: Secondary | ICD-10-CM

## 2018-04-04 DIAGNOSIS — I480 Paroxysmal atrial fibrillation: Secondary | ICD-10-CM | POA: Insufficient documentation

## 2018-04-04 DIAGNOSIS — Z8249 Family history of ischemic heart disease and other diseases of the circulatory system: Secondary | ICD-10-CM | POA: Insufficient documentation

## 2018-04-04 DIAGNOSIS — I270 Primary pulmonary hypertension: Secondary | ICD-10-CM | POA: Insufficient documentation

## 2018-04-04 DIAGNOSIS — R413 Other amnesia: Secondary | ICD-10-CM | POA: Diagnosis present

## 2018-04-04 DIAGNOSIS — I679 Cerebrovascular disease, unspecified: Secondary | ICD-10-CM | POA: Diagnosis not present

## 2018-04-04 DIAGNOSIS — Z955 Presence of coronary angioplasty implant and graft: Secondary | ICD-10-CM | POA: Insufficient documentation

## 2018-04-04 DIAGNOSIS — Z809 Family history of malignant neoplasm, unspecified: Secondary | ICD-10-CM | POA: Insufficient documentation

## 2018-04-04 DIAGNOSIS — I69311 Memory deficit following cerebral infarction: Secondary | ICD-10-CM | POA: Diagnosis not present

## 2018-04-04 DIAGNOSIS — I69319 Unspecified symptoms and signs involving cognitive functions following cerebral infarction: Secondary | ICD-10-CM

## 2018-04-04 DIAGNOSIS — Z9889 Other specified postprocedural states: Secondary | ICD-10-CM | POA: Insufficient documentation

## 2018-04-04 DIAGNOSIS — I69351 Hemiplegia and hemiparesis following cerebral infarction affecting right dominant side: Secondary | ICD-10-CM | POA: Insufficient documentation

## 2018-04-04 DIAGNOSIS — I69391 Dysphagia following cerebral infarction: Secondary | ICD-10-CM | POA: Insufficient documentation

## 2018-04-04 DIAGNOSIS — J9601 Acute respiratory failure with hypoxia: Secondary | ICD-10-CM | POA: Diagnosis not present

## 2018-04-04 NOTE — Progress Notes (Signed)
Today I provided feedback and interpretation of the recent neuropsychological evaluation.  The patient and her husband were present for this visit.  I reviewed the recent testing and interpreted with the patient provided feedback regarding the results.  Overall, the current or recent changes in memory and expressive language appear to be related primarily to white matter involvement related to chronic microvascular ischemia as well as effects of her recent stroke.  I do not think that an exacerbation of her MS is the primary factor.  I provided recommendations to the patient and her husband regarding what they should do going forward as far as reducing the issues related to the small vessel disease/microvascular ischemia.  Below is a copy of the summary from the neuropsychological evaluation.  Summary of Results:                        The results of the current neuropsychological evaluation are consistent with significant global neuropsychological deficits.  These deficits are primarily in the areas of learning and memory and recall for both visual and auditory information.  There also significant deficits with regard to expressive language functioning.  The patient displayed good performance with regard to visual spatial/visual constructional abilities as well as attention and concentration measures.  Impression/Diagnosis:                     The patient does have a complicated diagnostic question regarding the etiological factors of her current memory and language deficits and the relationship to her chronic long-term management of her MS versus deficits caused by a recent stroke as well as indications of chronic microvascular ischemia.  I do think that the recent changes related to her expressive language and motor functioning deficits are likely directly related to the stroke she had in 2018.  However, the memory deficits and the pattern of memory deficits are very likely to be a combination of both the  stroke as well as an ongoing pattern likely related to chronic microvascular ischemia.  While her MS/demyelinating disorder would also be consistent with these difficulty storing and retrieving information after period of delay because of its negative impact on white matter tracts she also has extensive white matter disease because of chronic microvascular ischemia.  Both of these conditions would be producing problems with regard to white matter tracts explaining her storage and retrieval of new information.  Differentiating these 2 factors would be very difficult but any acute recent changes are likely related to her stroke and/or ongoing microvascular ischemia.

## 2018-04-05 ENCOUNTER — Ambulatory Visit: Payer: Medicare HMO | Admitting: Physical Therapy

## 2018-04-05 DIAGNOSIS — M1612 Unilateral primary osteoarthritis, left hip: Secondary | ICD-10-CM | POA: Diagnosis not present

## 2018-04-05 NOTE — Telephone Encounter (Signed)
RE: refer to lipid clinic per Dr Delton See  Received: Today  Message Contents  Janett Billow, LPN        Schedule for 04-27-18 [facility] to call regarding the appointment

## 2018-04-07 ENCOUNTER — Telehealth: Payer: Self-pay | Admitting: *Deleted

## 2018-04-07 ENCOUNTER — Encounter: Payer: Self-pay | Admitting: Physical Therapy

## 2018-04-07 ENCOUNTER — Ambulatory Visit: Payer: Medicare HMO | Attending: Nurse Practitioner | Admitting: Physical Therapy

## 2018-04-07 DIAGNOSIS — M79652 Pain in left thigh: Secondary | ICD-10-CM | POA: Insufficient documentation

## 2018-04-07 DIAGNOSIS — M6281 Muscle weakness (generalized): Secondary | ICD-10-CM | POA: Insufficient documentation

## 2018-04-07 DIAGNOSIS — R2681 Unsteadiness on feet: Secondary | ICD-10-CM | POA: Insufficient documentation

## 2018-04-07 DIAGNOSIS — R2689 Other abnormalities of gait and mobility: Secondary | ICD-10-CM | POA: Insufficient documentation

## 2018-04-07 NOTE — Telephone Encounter (Signed)
-----   Message from Pricilla Holm sent at 04/07/2018  4:34 PM EDT ----- Regarding: RE: refer to lipid clinic per Dr Delton See 05-04-18  @ 11:30   ----- Message ----- From: Loa Socks, LPN Sent: 0/10/5463  11:06 AM EDT To: Pricilla Holm, [facility] Subject: FW: refer to lipid clinic per Dr Delton See          ----- Message ----- From: Loa Socks, LPN Sent: 6/81/2751   5:54 PM EDT To: Levin Bacon, RPH, Awilda Metro, [facility], # Subject: refer to lipid clinic per Dr Delton See            Per Dr Delton See, this pt needs to be referred to our lipid clinic for statin intolerance, elevated liver enzymes, CAD, history of stent placement, and recent abnormal liver US.  You will need to call her Husband (on DPR) for he is her primary caretaker.  Referral in the system and husband aware you will call to scheduled. Can you please arrange?  Thanks,  [employer]

## 2018-04-08 NOTE — Therapy (Signed)
[facility] [address] [place], [place], [address] Phone: [phone]   Fax:  [phone]  Physical Therapy Treatment  Patient Details  Name: Kaitlin [name] MRN: [id number] Date of Birth: 03/11/1946 Referring Provider: Martin, Nancy Carolyn, NP   Encounter Date: 04/07/2018  PT End of Session - 04/08/18 0630    Visit Number  16    Number of Visits  17    Date for PT Re-Evaluation  04/14/18    Authorization Type  A[employer]na Medicare    Authorization Time Period  12/27/17 to 12/27/17; 02/13/18 to 04/14/18    PT Start Time  0933    PT Stop Time  1015    PT Time Calculation (min)  42 min    Activity Tolerance  Patient tolerated treatment well   pain "grabbed" her during balance exercises attempting partial tandem   Behavior During Therapy  W[facility] for tasks assessed/performed       Past Medical History:  Diagnosis Date  . Abnormality of gait as late effect of stroke 02/23/2017  . Acute hypoxemic respiratory failure ([facility])   . Atrial fibrillation with RVR ([facility]) 02/17/2017  . CAD S/P percutaneous coronary angioplasty 2001  . CVA (cerebral vascular accident) ([facility]) 01/2017  . Dysphagia as late effect of stroke 02/23/2017  . HCVD (hypertensive cardiovascular disease) 01/2017   normal LVF with moderate LVH, grade 1 DD  . High cholesterol   . Hypokalemia   . Labile blood pressure   . Leukocytosis   . Multiple sclerosis ([facility])   . PAF (paroxysmal atrial fibrillation) ([facility]) 01/2017  . Right hemiparesis ([facility])     Past Surgical History:  Procedure Laterality Date  . CORONARY ANGIOPLASTY WITH STENT PLACEMENT  11/1999   [facility]  . IR ANGIO INTRA EXTRACRAN SEL COM CAROTID INNOMINATE UNI R MOD SED  02/15/2017  . IR ANGIO VERTEBRAL SEL SUBCLAVIAN INNOMINATE UNI R MOD SED  02/15/2017  . IR INTRAVSC STENT CERV CAROTID W/O EMB-PROT MOD SED INC ANGIO  02/15/2017  . IR PERCUTANEOUS ART THROMBECTOMY/INFUSION INTRACRANIAL INC DIAG ANGIO   02/15/2017  . IR RADIOLOGIST EVAL & MGMT  05/10/2017  . NECK SURGERY    . RADIOLOGY WITH ANESTHESIA N/A 02/14/2017   Procedure: RADIOLOGY WITH ANESTHESIA;  Surgeon: Radiologist, Medication, MD;  [facility];  Service: Radiology;  Laterality: N/A;  . WRIST SURGERY      There were no vitals filed for this visit.  Subjective Assessment - 04/07/18 0939    Subjective  Got a shot for her hip on 9/4. Was very sore yesterday and better today.   (Pended)     Patient is accompained by:  Family member  (Pended)    spouse, [name]   Pertinent History  afib, CAD s/p PTCA, MS with gait disorder/memory loss, lt cca and ICA CVA 7/18  Extensive white matter disease; fall 06/2017 with SAH   (Pended)     Limitations  Walking;Standing  (Pended)     How long can you sit comfortably?  unlimited  (Pended)     How long can you stand comfortably?  <1 minute  (Pended)     How long can you walk comfortably?  none  (Pended)     Diagnostic tests  MRI 12/08/17 with no acute changes  (Pended)     Patient Stated Goals  pt unable to state; husband wants pt to be able to walk again without a device  (Pended)     Currently in Pain?    No/denies  (Pended)    while seated   Pain Onset  More than a month ago  (Pended)                        [facility] Adult PT Treatment/Exercise - 04/07/18 0958      Knee/Hip Exercises: Stretches   Piriformis Stretch  Right;Left;2 reps;30 seconds   knee to opp shoulder & figure 4      Knee/Hip Exercises: Aerobic   Nustep  1 min L2 (machine seemed to be dragging and switched to [employer] L1 x 2 minutes with increasing hip pain      Knee/Hip Exercises: Standing   Hip Flexion  Stengthening;Both;1 set;10 reps    Hip Abduction  Stengthening;Both    Abduction Limitations  modifed to step out and step back in x 10 each leg    Other Standing Knee Exercises  step backward and step forward, alternating holding onto counteregs x      Knee/Hip Exercises: Sidelying   Clams  2 sets of 10  with 3 sec hold               PT Short Term Goals - 03/10/18 1929      PT SHORT TERM GOAL #1   Title  Patient (with supervision to min-guard assistance of her spouse) will perform basic HEP for balance and strengthening bil LEs. (Target STGs 03/15/2018)    Time  4    Period  Weeks    Status  Achieved      PT SHORT TERM GOAL #2   Title  Patient will improve gait velocity to >=1.81 Ft/sec to demonstrate decr fall risk    Baseline  03/10/18 with RW 1.47 ft/sec with RW    Time  4    Period  Weeks    Status  Partially Met      PT SHORT TERM GOAL #3   Title  Patient will improve TUG with or without a device to <22 seconds to demonstrate a lesser fall risk.     Baseline  03/10/18 19.28 sec    Time  4    Period  Weeks    Status  Achieved      PT SHORT TERM GOAL #4   Title  Patient will ambulate 400 ft consecutively with LRAD with minguard assist for safety.     Baseline  03/10/18 met    Time  4    Period  Weeks    Status  Achieved        PT Long Term Goals - 02/13/18 1233      PT LONG TERM GOAL #1   Title  Patient (with supervision of husband) will perform updated HEP (Target for all LTGs 04/14/2018)    Time  8    Period  Weeks    Status  New      PT LONG TERM GOAL #2   Title  Patient will improve gait velocity to >=2.3 ft/sec to show progress towards safe community ambulation.     Time  8    Period  Weeks    Status  New      PT LONG TERM GOAL #3   Title  Patient will decr TUG to <18 sec with LRAD     Time  8    Period  Weeks    Status  New            Plan - 04/07/18 1700      Clinical Impression Statement  Patient's hip is less painful today post-injection. Throughout sesssion denied any pain below her knee. Slowly increasing challenge to her exercises and requested pt not yet return to her standing exercises at home.     Rehab Potential  Good    Clinical Impairments Affecting Rehab Potential  lt thigh pain of unknown origin (has been cleared by MD to return);  decr memory    PT Frequency  2x / week    PT Duration  8 weeks    PT Treatment/Interventions  ADLs/Self Care Home Management;Gait training;DME Instruction;Stair training;Functional mobility training;Therapeutic activities;Therapeutic exercise;Balance training;Neuromuscular re-education;Cognitive remediation;Orthotic Fit/Training;Patient/family education;Passive range of motion;Manual techniques;Dry needling;Moist Heat;Ultrasound;Taping   Plan updated to further address left thigh pain; recertification sent to reflect changes   PT Next Visit Plan  chk LTGs and recert; check tolerance for activity since last visit; restorator for warm-up; gait training RW, LE strengthening try standing ex's again. Once leg feeling better, ?try cane    Consulted and Agree with Plan of Care  Patient;Family member/caregiver    Family Member Consulted  [name], husband       Patient will benefit from skilled therapeutic intervention in order to improve the following deficits and impairments:  Decreased activity tolerance, Decreased balance, Decreased cognition, Decreased mobility, Decreased knowledge of use of DME, Decreased endurance, Decreased range of motion, Decreased strength, Difficulty walking, Impaired flexibility, Postural dysfunction, Pain(TBA if pt cleared for PT)  Visit Diagnosis: Pain in left thigh  Other abnormalities of gait and mobility  Muscle weakness (generalized)  Unsteadiness on feet     Problem List Patient Active Problem List   Diagnosis Date Noted  . History of stroke 11/30/2017  . Cognitive deficit, post-stroke 09/30/2017  . Subarachnoid hemorrhage ([place]) 06/04/2017  . Multiple sclerosis ([place]) 05/12/2017  . CAD (coronary artery disease) 04/12/2017  . Hypertension 04/11/2017  . Right hemiparesis ([place])   . Aspiration pneumonia of right lung ([place])   . Leukocytosis   . Hypokalemia   . Labile blood pressure   . Abnormality of gait as late effect of stroke 02/23/2017  . Dysarthria as  late effect of stroke 02/23/2017  . Dysphagia as late effect of stroke 02/23/2017  . Acute ischemic left middle cerebral artery (MCA) stroke ([place]) 02/22/2017  . Paroxysmal A-fib ([place]) 02/17/2017  . Atrial fibrillation with RVR ([place]) 02/17/2017  . HLD (hyperlipidemia) 02/17/2017  . Acute hypoxemic respiratory failure ([place])   . Stroke (cerebrum) ([facility]) - Acute MCA infarct due to left CCA/ICA and MCA occlusion, s/p mechanical thrombectomy and left ICA stenting in the setting of newly diagnosed afib 02/14/2017  . Atherosclerotic cardiovascular disease 11/07/2013    Rexanne Mano, PT 04/08/2018, 6:55 AM  [facility] [address] [place], [place], [address] Phone: [phone]   Fax:  [phone]  Name: Kaitlin [name] MRN: [id number] Date of Birth: 1945-09-14

## 2018-04-10 ENCOUNTER — Ambulatory Visit: Payer: Medicare HMO | Admitting: Psychology

## 2018-04-12 ENCOUNTER — Encounter: Payer: Self-pay | Admitting: Physical Therapy

## 2018-04-12 ENCOUNTER — Ambulatory Visit: Payer: Medicare HMO | Admitting: Physical Therapy

## 2018-04-12 DIAGNOSIS — M79652 Pain in left thigh: Secondary | ICD-10-CM | POA: Diagnosis not present

## 2018-04-12 DIAGNOSIS — M6281 Muscle weakness (generalized): Secondary | ICD-10-CM | POA: Diagnosis not present

## 2018-04-12 DIAGNOSIS — R2681 Unsteadiness on feet: Secondary | ICD-10-CM

## 2018-04-12 DIAGNOSIS — R2689 Other abnormalities of gait and mobility: Secondary | ICD-10-CM | POA: Diagnosis not present

## 2018-04-12 NOTE — Therapy (Signed)
[facility] [address] [place], [place], [address] Phone: [phone]   Fax:  [phone]  Physical Therapy Treatment  Patient Details  Name: Kaitlin [name] MRN: [id number] Date of Birth: 25-Dec-1945 Referring Provider: Dennie Bible, NP   Encounter Date: 04/12/2018  PT End of Session - 04/12/18 1016    Visit Number  17    Number of Visits  25    Date for PT Re-Evaluation  05/12/18    Authorization Type  A[employer]na Medicare    Authorization Time Period  12/27/17 to 12/27/17; 02/13/18 to 04/14/18; 04/12/18 to 07/11/2018    PT Start Time  0933    PT Stop Time  1015    PT Time Calculation (min)  42 min    Equipment Utilized During Treatment  Gait belt    Activity Tolerance  Patient limited by pain    Behavior During Therapy  [facility] for tasks assessed/performed       Past Medical History:  Diagnosis Date  . Abnormality of gait as late effect of stroke 02/23/2017  . Acute hypoxemic respiratory failure ([place])   . Atrial fibrillation with RVR ([place]) 02/17/2017  . CAD S/P percutaneous coronary angioplasty 2001  . CVA (cerebral vascular accident) ([place]) 01/2017  . Dysphagia as late effect of stroke 02/23/2017  . HCVD (hypertensive cardiovascular disease) 01/2017   normal LVF with moderate LVH, grade 1 DD  . High cholesterol   . Hypokalemia   . Labile blood pressure   . Leukocytosis   . Multiple sclerosis ([place])   . PAF (paroxysmal atrial fibrillation) ([place]) 01/2017  . Right hemiparesis [facility])     Past Surgical History:  Procedure Laterality Date  . CORONARY ANGIOPLASTY WITH STENT PLACEMENT  11/1999   RCA DES-[facility]  . IR ANGIO INTRA EXTRACRAN SEL COM CAROTID INNOMINATE UNI R MOD SED  02/15/2017  . IR ANGIO VERTEBRAL SEL SUBCLAVIAN INNOMINATE UNI R MOD SED  02/15/2017  . IR INTRAVSC STENT CERV CAROTID W/O EMB-PROT MOD SED INC ANGIO  02/15/2017  . IR PERCUTANEOUS ART THROMBECTOMY/INFUSION INTRACRANIAL INC DIAG ANGIO  02/15/2017   . IR RADIOLOGIST EVAL & MGMT  05/10/2017  . NECK SURGERY    . RADIOLOGY WITH ANESTHESIA N/A 02/14/2017   Procedure: RADIOLOGY WITH ANESTHESIA;  Surgeon: Radiologist, Medication, MD;  Location: [facility];  Service: Radiology;  Laterality: N/A;  . WRIST SURGERY      There were no vitals filed for this visit.  Subjective Assessment - 04/12/18 0935    Subjective  Hip is doing much better. No pain! Husband has caught her walking without her walker even!    Patient is accompained by:  Family member   spouse, Kaitlin [name]   Pertinent History  afib, CAD s/p PTCA, MS with gait disorder/memory loss, lt cca and ICA CVA 7/18  Extensive white matter disease; fall 06/2017 with SAH     Limitations  Walking;Standing    How long can you sit comfortably?  unlimited    How long can you stand comfortably?  <1 minute    How long can you walk comfortably?  none    Diagnostic tests  MRI 12/08/17 with no acute changes    Patient Stated Goals  pt unable to state; husband wants pt to be able to walk again without a device    Currently in Pain?  No/denies    Pain Onset  --  [facility] Adult PT Treatment/Exercise - 04/12/18 0944      Transfers   Transfers  Sit to Stand;Stand to Sit    Sit to Stand  5: Supervision    Sit to Stand Details (indicate cue type and reason)  with and without RW; cont'd vc for safe hand placemnt with RW    Stand to Sit  5: Supervision      Ambulation/Gait   Ambulation/Gait Assistance  5: Supervision    Ambulation/Gait Assistance Details  vc for upright posture with RW; no device she had mild limp and report RIGHT ANKLE pain but no left hip pain    Ambulation Distance (Feet)  100 Feet   120 x 2, 40 x 2   Assistive device  Rolling walker;None    Gait Pattern  Step-through pattern;Decreased stride length;Antalgic;Trunk flexed;Decreased weight shift to right    Ambulation Surface  Indoor    Gait velocity  32.8 ft/20.41 sec=1.61 ft/sec with RW; 32.8 ft/14.75 sec=2.22  ft/sec    Gait Comments  After 60 ft with no RW, pt began to limp; after rest breaks ultimately went back to using RW to avoid further incr pain to ankle or lt hip      Timed Up and Go Test   Normal TUG (seconds)  19.53   with RW; 15.28 no device     Knee/Hip Exercises: Stretches   Other Knee/Hip Stretches  PROMstretching for left hip IR/ER; distraction of femur; active sciatic nerve glide in supine with trace amount of pain in left lateral thigh and lower leg      Knee/Hip Exercises: Aerobic   Nustep  L1 x 4 min warm-up with no left hip pain, +rt ankle pain with no change with warm-up activity      Knee/Hip Exercises: Standing   Hip Flexion  AROM;Both;1 set;20 reps;Knee bent    Hip Abduction  Stengthening;Both;1 set;20 reps;Knee straight      Manual Therapy   Manual Therapy  Joint mobilization;Soft tissue mobilization;Manual Traction    Joint Mobilization  grade 1-II midrange hip flex/extension in supine    Soft tissue mobilization  IT band and left hip abductors    Passive ROM  rt sidelying with left hip flexed, knee straight for IT band stretch    Manual Traction  LLE using femoral condyles x 30 sec x 2          Balance Exercises - 04/12/18 1004      Balance Exercises: Standing   Tandem Gait  Forward;Upper extremity support;4 reps   at counter   Retro Gait  Upper extremity support;4 reps    Sidestepping  Upper extremity support;4 reps        PT Education - 04/12/18 1847    Education Details  continue cycling, stretches and add back standing hip abduction at counter    Person(s) Educated  Patient;Spouse    Methods  Explanation;Demonstration;Verbal cues    Comprehension  Verbalized understanding;Returned demonstration;Verbal cues required       PT Short Term Goals - 03/10/18 1929      PT SHORT TERM GOAL #1   Title  Patient (with supervision to min-guard assistance of her spouse) will perform basic HEP for balance and strengthening bil LEs. (Target STGs 03/15/2018)     Time  4    Period  Weeks    Status  Achieved      PT SHORT TERM GOAL #2   Title  Patient will improve gait velocity to >=1.81 Ft/sec to demonstrate  decr fall risk    Baseline  03/10/18 with RW 1.47 ft/sec with RW    Time  4    Period  Weeks    Status  Partially Met      PT SHORT TERM GOAL #3   Title  Patient will improve TUG with or without a device to <22 seconds to demonstrate a lesser fall risk.     Baseline  03/10/18 19.28 sec    Time  4    Period  Weeks    Status  Achieved      PT SHORT TERM GOAL #4   Title  Patient will ambulate 400 ft consecutively with LRAD with minguard assist for safety.     Baseline  03/10/18 met    Time  4    Period  Weeks    Status  Achieved        PT Long Term Goals - 04/12/18 1849      PT LONG TERM GOAL #1   Title  Patient (with supervision of husband) will perform updated HEP (Target for all LTGs 04/14/2018)    Baseline  04/12/18 met, however only doing portions of HEP that do not cause incr left hip pain--beginning to add back standing ex's    Time  8    Period  Weeks    Status  Achieved      PT LONG TERM GOAL #2   Title  Patient will improve gait velocity to >=2.3 ft/sec to show progress towards safe community ambulation.     Baseline  04/12/18 1.61 ft/sec with RW, 2.22 ft/sec no device    Time  8    Period  Weeks    Status  Partially Met      PT LONG TERM GOAL #3   Title  Patient will decr TUG to <18 sec with LRAD     Baseline  04/12/18  19.53 sec with RW; 15.28 no device    Time  8    Period  Weeks    Status  Achieved       NEW LONG-TERM GOALS as of 04/12/18 PT Long Term Goals - 04/12/18 1905      PT LONG TERM GOAL #1   Title  Patient (with supervision of husband) will perform updated HEP, to include standing strengthening and balance exercises (Target for all LTGs 05/12/2018)    Time  4    Period  Weeks    Status  Revised      PT LONG TERM GOAL #2   Title  Patient will improve gait velocity to >=2.5 ft/sec to show progress  towards safe community ambulation.     Time  4    Period  Weeks    Status  Revised      PT LONG TERM GOAL #3   Title  Patient will decr TUG with no device to <=13.5 seconds to reflect decr fall risk.     Time  4    Period  Weeks    Status  Revised           Plan - 04/12/18 1854    Clinical Impression Statement  Patient arrived today with no hip pain! (First time since evaluation). LTGs assessed with pt meeting 2 of 3 goals and partially meeting 3rd goal (made progress but not to goal level). Patient with recent left hip injection due to severe arthritis and finally is having less pain. Progress has been slow due to hip pain and have not  achieved pt/husband's goal to return to walking without RW. Anticipate can further benefit and make faster progress now that she has had steroid injection for her hip. Will recertify for 4 weeks (and hope to discharge sooner).     Rehab Potential  Good    Clinical Impairments Affecting Rehab Potential  lt thigh pain and decr memory (however husband attends all sessions and assists her with her home program)    PT Frequency  2x / week    PT Duration  4 weeks    PT Treatment/Interventions  ADLs/Self Care Home Management;Gait training;DME Instruction;Stair training;Functional mobility training;Therapeutic activities;Therapeutic exercise;Balance training;Neuromuscular re-education;Cognitive remediation;Orthotic Fit/Training;Patient/family education;Passive range of motion;Manual techniques;Dry needling;Moist Heat;Ultrasound;Taping;Aquatic Therapy   Plan updated to further address left thigh pain; recertification sent to reflect changes   PT Next Visit Plan   check tolerance for activity since last visit--has she tolerated standing hip abdct exercise added back to her HEP?; Nustep vs restorator for warm-up; gait training no RW, LE strengthening--try standing ex's again and add back to HEP as approp    Consulted and Agree with Plan of Care  Patient;Family  member/caregiver    Family Member Consulted  Kaitlin [name], husband       Patient will benefit from skilled therapeutic intervention in order to improve the following deficits and impairments:  Decreased activity tolerance, Decreased balance, Decreased cognition, Decreased mobility, Decreased knowledge of use of DME, Decreased endurance, Decreased range of motion, Decreased strength, Difficulty walking, Impaired flexibility, Postural dysfunction, Pain, Increased muscle spasms, Increased fascial restricitons(TBA if pt cleared for PT)  Visit Diagnosis: Pain in left thigh  Other abnormalities of gait and mobility  Muscle weakness (generalized)  Unsteadiness on feet     Problem List Patient Active Problem List   Diagnosis Date Noted  . History of stroke 11/30/2017  . Cognitive deficit, post-stroke 09/30/2017  . Subarachnoid hemorrhage ([place]) 06/04/2017  . Multiple sclerosis ([place]) 05/12/2017  . CAD (coronary artery disease) 04/12/2017  . Hypertension 04/11/2017  . Right hemiparesis ([place])   . Aspiration pneumonia of right lung ([place])   . Leukocytosis   . Hypokalemia   . Labile blood pressure   . Abnormality of gait as late effect of stroke 02/23/2017  . Dysarthria as late effect of stroke 02/23/2017  . Dysphagia as late effect of stroke 02/23/2017  . Acute ischemic left middle cerebral artery (MCA) stroke ([employer]) 02/22/2017  . Paroxysmal A-fib ([place]) 02/17/2017  . Atrial fibrillation with RVR ([place]) 02/17/2017  . HLD (hyperlipidemia) 02/17/2017  . Acute hypoxemic respiratory failure ([place])   . Stroke (cerebrum) ([facility]) - Acute MCA infarct due to left CCA/ICA and MCA occlusion, s/p mechanical thrombectomy and left ICA stenting in the setting of newly diagnosed afib 02/14/2017  . Atherosclerotic cardiovascular disease 11/07/2013    Rexanne Mano, PT 04/12/2018, 7:03 PM  [facility] [address] [place], [place], [address] Phone:  [phone]   Fax:  [phone]  Name: Aleathea Pugmire MRN: [id number] Date of Birth: September 21, 1945

## 2018-04-13 ENCOUNTER — Ambulatory Visit (HOSPITAL_COMMUNITY)
Admission: RE | Admit: 2018-04-13 | Discharge: 2018-04-13 | Disposition: A | Discharge status: Home / Self Care | Payer: Medicare HMO | Source: Ambulatory Visit | Attending: Interventional Radiology | Admitting: Interventional Radiology

## 2018-04-13 DIAGNOSIS — I2510 Atherosclerotic heart disease of native coronary artery without angina pectoris: Secondary | ICD-10-CM | POA: Diagnosis not present

## 2018-04-13 DIAGNOSIS — I639 Cerebral infarction, unspecified: Secondary | ICD-10-CM | POA: Diagnosis not present

## 2018-04-13 DIAGNOSIS — I6522 Occlusion and stenosis of left carotid artery: Secondary | ICD-10-CM | POA: Insufficient documentation

## 2018-04-13 NOTE — Progress Notes (Signed)
Carotid duplex prelim: 1-39% ICA stenosis.  Leta Jungling RDCS

## 2018-04-14 ENCOUNTER — Encounter: Payer: Self-pay | Admitting: Physical Therapy

## 2018-04-14 ENCOUNTER — Ambulatory Visit: Payer: Medicare HMO | Admitting: Physical Therapy

## 2018-04-14 DIAGNOSIS — R2681 Unsteadiness on feet: Secondary | ICD-10-CM

## 2018-04-14 DIAGNOSIS — M79652 Pain in left thigh: Secondary | ICD-10-CM | POA: Diagnosis not present

## 2018-04-14 DIAGNOSIS — R2689 Other abnormalities of gait and mobility: Secondary | ICD-10-CM | POA: Diagnosis not present

## 2018-04-14 DIAGNOSIS — M6281 Muscle weakness (generalized): Secondary | ICD-10-CM | POA: Diagnosis not present

## 2018-04-14 NOTE — Therapy (Signed)
[facility] [address] [place], [place], [address] Phone: [phone]   Fax:  [phone]  Physical Therapy Treatment  Patient Details  Name: Kaitlin [name] MRN: [id number] Date of Birth: Nov 11, 1945 Referring Provider: Nilda Riggs, NP   Encounter Date: 04/14/2018  PT End of Session - 04/14/18 0939    Visit Number  18    Number of Visits  25    Date for PT Re-Evaluation  05/12/18    Authorization Type  Aetna Medicare    Authorization Time Period  12/27/17 to 12/27/17; 02/13/18 to 04/14/18; 04/12/18 to 07/11/2018    PT Start Time  0935    PT Stop Time  1015    PT Time Calculation (min)  40 min    Equipment Utilized During Treatment  Gait belt    Activity Tolerance  Patient tolerated treatment well;No increased pain;Patient limited by pain    Behavior During Therapy  [facility] for tasks assessed/performed       Past Medical History:  Diagnosis Date  . Abnormality of gait as late effect of stroke 02/23/2017  . Acute hypoxemic respiratory failure ([facility])   . Atrial fibrillation with RVR ([facility]) 02/17/2017  . CAD S/P percutaneous coronary angioplasty 2001  . CVA (cerebral vascular accident) ([facility]) 01/2017  . Dysphagia as late effect of stroke 02/23/2017  . HCVD (hypertensive cardiovascular disease) 01/2017   normal LVF with moderate LVH, grade 1 DD  . High cholesterol   . Hypokalemia   . Labile blood pressure   . Leukocytosis   . Multiple sclerosis ([facility])   . PAF (paroxysmal atrial fibrillation) ([facility]) 01/2017  . Right hemiparesis [facility])     Past Surgical History:  Procedure Laterality Date  . CORONARY ANGIOPLASTY WITH STENT PLACEMENT  11/1999   [facility]  . IR ANGIO INTRA EXTRACRAN SEL COM CAROTID INNOMINATE UNI R MOD SED  02/15/2017  . IR ANGIO VERTEBRAL SEL SUBCLAVIAN INNOMINATE UNI R MOD SED  02/15/2017  . IR INTRAVSC STENT CERV CAROTID W/O EMB-PROT MOD SED INC ANGIO  02/15/2017  . IR PERCUTANEOUS ART  THROMBECTOMY/INFUSION INTRACRANIAL INC DIAG ANGIO  02/15/2017  . IR RADIOLOGIST EVAL & MGMT  05/10/2017  . NECK SURGERY    . RADIOLOGY WITH ANESTHESIA N/A 02/14/2017   Procedure: RADIOLOGY WITH ANESTHESIA;  Surgeon: Radiologist, Medication, MD;  [facility];  Service: Radiology;  Laterality: N/A;  . WRIST SURGERY      There were no vitals filed for this visit.  Subjective Assessment - 04/14/18 0937    Subjective  No new complaints. No falls. Still with some hip discomfort, much better that is has been.     Patient is accompained by:  Family member   spouse, [name]   Pertinent History  afib, CAD s/p PTCA, MS with gait disorder/memory loss, lt cca and ICA CVA 7/18  Extensive white matter disease; fall 06/2017 with SAH     Limitations  Walking;Standing    How long can you sit comfortably?  unlimited    How long can you stand comfortably?  <1 minute    How long can you walk comfortably?  none    Diagnostic tests  MRI 12/08/17 with no acute changes    Patient Stated Goals  pt unable to state; husband wants pt to be able to walk again without a device    Currently in Pain?  Yes    Pain Score  2     Pain Location  Hip  Pain Orientation  Left    Pain Descriptors / Indicators  Aching    Pain Type  Chronic pain    Pain Onset  More than a month ago    Pain Frequency  Intermittent    Aggravating Factors   increased activity    Pain Relieving Factors  rest, recent injection           [facility] Adult PT Treatment/Exercise - 04/14/18 4098      Neuro Re-ed    Neuro Re-ed Details   at counter: fwd marching x 4 laps with single UE support, min guard assist. cues on posture and ex form/technique. tandem gait fwd with up to min assist for balance with single UE support. did not addt this to HEP due to assistance needed. static standing in tandem with bil UE support facing counter for 15 sec holds x 2 reps each foot foward with min guard assit. Pt was able to progress to hovering hands over counter top  with each rep. In corner with chair in front for safety: on floor- wide base of support progressing to narrow base of support with EC no head movements progressing to head movements. on pillows: wide base of support progressing to narrow base of support for EC no head movements, progressed back to wide base of support for EC head movements left<>right, then up<>down. min guard assist for safety with all corner balance ex's.                        Exercises   Exercises  Other Exercises    Other Exercises   at counter: side stepping left<>right x 2 laps each way with bil light UE support, min guard assist, cues on correct ex form/technique.       Knee/Hip Exercises: Aerobic   Nustep  Level 2 with UE/LE's for 8 minutes with goal >/= 60 for strengthening and activity tolerance          PT Education - 04/14/18 1023    Education provided  Yes    Education Details  added standing strengthening and balance ex's to HEP today.     Person(s) Educated  Spouse    Methods  Explanation;Demonstration;Verbal cues;Tactile cues;Handout    Comprehension  Verbalized understanding;Returned demonstration;Verbal cues required;Tactile cues required;Need further instruction       PT Short Term Goals - 04/12/18 1904      PT SHORT TERM GOAL #1   Title  See LTGs        PT Long Term Goals - 04/12/18 1905      PT LONG TERM GOAL #1   Title  Patient (with supervision of husband) will perform updated HEP, to include standing strengthening and balance exercises (Target for all LTGs 05/12/2018)    Time  4    Period  Weeks    Status  Revised      PT LONG TERM GOAL #2   Title  Patient will improve gait velocity to >=2.5 ft/sec to show progress towards safe community ambulation.     Time  4    Period  Weeks    Status  Revised      PT LONG TERM GOAL #3   Title  Patient will decr TUG with no device to <=13.5 seconds to reflect decr fall risk.     Time  4    Period  Weeks    Status  Revised  Plan - 04/14/18 0940    Clinical Impression Statement  Today's skilled session addressed strengthening and balance with decreased UE support. Added new exercises to HEP after performance in session without any issues reported. Spouse was present and educated as well. The pt is making steady progress and should benefit from continued PT to progress toward unmet goals.     Rehab Potential  Good    Clinical Impairments Affecting Rehab Potential  lt thigh pain and decr memory (however husband attends all sessions and assists her with her home program)    PT Frequency  2x / week    PT Duration  4 weeks    PT Treatment/Interventions  ADLs/Self Care Home Management;Gait training;DME Instruction;Stair training;Functional mobility training;Therapeutic activities;Therapeutic exercise;Balance training;Neuromuscular re-education;Cognitive remediation;Orthotic Fit/Training;Patient/family education;Passive range of motion;Manual techniques;Dry needling;Moist Heat;Ultrasound;Taping;Aquatic Therapy   Plan updated to further address left thigh pain; recertification sent to reflect changes   PT Next Visit Plan  see how new HEP additions are going?, continue with use of Nustep ro restorator for warm ups, then continue with LE strengthening, balance activities and gait with no AD progressing toward updated goals     Consulted and Agree with Plan of Care  Patient;Family member/caregiver    Family Member Consulted  [name], husband       Patient will benefit from skilled therapeutic intervention in order to improve the following deficits and impairments:  Decreased activity tolerance, Decreased balance, Decreased cognition, Decreased mobility, Decreased knowledge of use of DME, Decreased endurance, Decreased range of motion, Decreased strength, Difficulty walking, Impaired flexibility, Postural dysfunction, Pain, Increased muscle spasms, Increased fascial restricitons(TBA if pt cleared for PT)  Visit  Diagnosis: Pain in left thigh  Other abnormalities of gait and mobility  Muscle weakness (generalized)  Unsteadiness on feet     Problem List Patient Active Problem List   Diagnosis Date Noted  . History of stroke 11/30/2017  . Cognitive deficit, post-stroke 09/30/2017  . Subarachnoid hemorrhage ([facility]) 06/04/2017  . Multiple sclerosis ([facility]) 05/12/2017  . CAD (coronary artery disease) 04/12/2017  . Hypertension 04/11/2017  . Right hemiparesis ([facility])   . Aspiration pneumonia of right lung ([facility])   . Leukocytosis   . Hypokalemia   . Labile blood pressure   . Abnormality of gait as late effect of stroke 02/23/2017  . Dysarthria as late effect of stroke 02/23/2017  . Dysphagia as late effect of stroke 02/23/2017  . Acute ischemic left middle cerebral artery (MCA) stroke ([facility]) 02/22/2017  . Paroxysmal A-fib ([facility]) 02/17/2017  . Atrial fibrillation with RVR ([facility]) 02/17/2017  . HLD (hyperlipidemia) 02/17/2017  . Acute hypoxemic respiratory failure ([facility])   . Stroke (cerebrum) ([facility]) - Acute MCA infarct due to left CCA/ICA and MCA occlusion, s/p mechanical thrombectomy and left ICA stenting in the setting of newly diagnosed afib 02/14/2017  . Atherosclerotic cardiovascular disease 11/07/2013    Sallyanne Kuster, PTA, [facility] Out[facility] Neuro [facility] [address], [address] [place], [place] [address] [phone] 04/14/18, 10:39 AM   Name: Kaitlin [name] MRN: [id number] Date of Birth: 1945-10-18

## 2018-04-14 NOTE — Patient Instructions (Addendum)
"  I love a Licensed conveyancer    Use the counter top for balance support: march forward slowly along counter top. At the end, turn around and march fwd to the starting place. Repeat down and back again.  4 laps total. 1-2 times a day, 3-5 days a week. http://gt2.exer.us/345  Copyright  VHI. All rights reserved.   Side-Stepping    At the counter top: walk sideways along counter top toward the other end. Then walk sideways back to the starting point. Repeat again down and back.  4 laps total, 1-2 times a day, 3-5 days a week. Copyright  VHI. All rights reserved.      TANDEM STANCE WITH SUPPORT  Stand in front of counter top for support. Then place the heel of one foot so that it is touching the toes of the other foot. Maintain your balance in this position. Hold for 15 seconds.  Perform 2 reps with each foot in front, 1-2 times a day, 3-5 days a week.  Perform these in a corner with a chair in front of you for safety:       Feet Together (Compliant Surface) Varied Arm Positions - Eyes Closed    Stand on compliant surface: _pillow/s_ with feet together and arms at sides. Close eyes and visualize upright position. Hold__30__ seconds. Repeat __2-3__ times per session. Do __1_ sessions per day, 3-5 days a week.  Copyright  VHI. All rights reserved.    Feet Apart (Compliant Surface) Head Motion - Eyes Closed    Stand on compliant surface: _pillow/s_ with feet shoulder width apart. Close eyes and move head slowly up and down, then left and right.   Repeat __10__ times each way per session. Do __1__ sessions per day, 3-5 days a week.  Copyright  VHI. All rights reserved.

## 2018-04-17 ENCOUNTER — Telehealth (HOSPITAL_COMMUNITY): Payer: Self-pay

## 2018-04-17 NOTE — Telephone Encounter (Signed)
Pt's husband agreed to f/u in 6 months with us carotid. AW  

## 2018-04-18 DIAGNOSIS — I69351 Hemiplegia and hemiparesis following cerebral infarction affecting right dominant side: Secondary | ICD-10-CM | POA: Diagnosis not present

## 2018-04-18 DIAGNOSIS — R2689 Other abnormalities of gait and mobility: Secondary | ICD-10-CM | POA: Diagnosis not present

## 2018-04-18 DIAGNOSIS — G35 Multiple sclerosis: Secondary | ICD-10-CM | POA: Diagnosis not present

## 2018-04-18 DIAGNOSIS — I639 Cerebral infarction, unspecified: Secondary | ICD-10-CM | POA: Diagnosis not present

## 2018-04-19 ENCOUNTER — Ambulatory Visit: Payer: Medicare HMO | Admitting: Physical Therapy

## 2018-04-19 ENCOUNTER — Encounter: Payer: Self-pay | Admitting: Physical Therapy

## 2018-04-19 DIAGNOSIS — R2681 Unsteadiness on feet: Secondary | ICD-10-CM | POA: Diagnosis not present

## 2018-04-19 DIAGNOSIS — M6281 Muscle weakness (generalized): Secondary | ICD-10-CM | POA: Diagnosis not present

## 2018-04-19 DIAGNOSIS — R2689 Other abnormalities of gait and mobility: Secondary | ICD-10-CM

## 2018-04-19 DIAGNOSIS — M79652 Pain in left thigh: Secondary | ICD-10-CM | POA: Diagnosis not present

## 2018-04-19 NOTE — Therapy (Signed)
[facility] [address] [place], [place], [address] Phone: [phone]   Fax:  [phone]  Physical Therapy Treatment  Patient Details  Name: Kaitlin [name] MRN: [id number] Date of Birth: 1945-10-14 Referring Provider: Nilda Riggs, NP   Encounter Date: 04/19/2018  PT End of Session - 04/19/18 1957    Visit Number  19    Number of Visits  25    Date for PT Re-Evaluation  05/12/18    Authorization Type  [employer] Medicare    Authorization Time Period  12/27/17 to 12/27/17; 02/13/18 to 04/14/18; 04/12/18 to 07/11/2018    PT Start Time  1017    PT Stop Time  1056    PT Time Calculation (min)  39 min    Equipment Utilized During Treatment  --    Activity Tolerance  Patient tolerated treatment well;No increased pain    Behavior During Therapy  WFL for tasks assessed/performed       Past Medical History:  Diagnosis Date  . Abnormality of gait as late effect of stroke 02/23/2017  . Acute hypoxemic respiratory failure (HCC)   . Atrial fibrillation with RVR (HCC) 02/17/2017  . CAD S/P percutaneous coronary angioplasty 2001  . CVA (cerebral vascular accident) (HCC) 01/2017  . Dysphagia as late effect of stroke 02/23/2017  . HCVD (hypertensive cardiovascular disease) 01/2017   normal LVF with moderate LVH, grade 1 DD  . High cholesterol   . Hypokalemia   . Labile blood pressure   . Leukocytosis   . Multiple sclerosis (HCC)   . PAF (paroxysmal atrial fibrillation) (HCC) 01/2017  . Right hemiparesis [facility])     Past Surgical History:  Procedure Laterality Date  . CORONARY ANGIOPLASTY WITH STENT PLACEMENT  11/1999   RCA DES-[facility]  . IR ANGIO INTRA EXTRACRAN SEL COM CAROTID INNOMINATE UNI R MOD SED  02/15/2017  . IR ANGIO VERTEBRAL SEL SUBCLAVIAN INNOMINATE UNI R MOD SED  02/15/2017  . IR INTRAVSC STENT CERV CAROTID W/O EMB-PROT MOD SED INC ANGIO  02/15/2017  . IR PERCUTANEOUS ART THROMBECTOMY/INFUSION INTRACRANIAL INC  DIAG ANGIO  02/15/2017  . IR RADIOLOGIST EVAL & MGMT  05/10/2017  . NECK SURGERY    . RADIOLOGY WITH ANESTHESIA N/A 02/14/2017   Procedure: RADIOLOGY WITH ANESTHESIA;  Surgeon: Radiologist, Medication, MD;  Location: [facility] OR;  Service: Radiology;  Laterality: N/A;  . WRIST SURGERY      There were no vitals filed for this visit.  Subjective Assessment - 04/19/18 1019    Subjective  She's doing well. Not using walker in the kitchen when she can hold onto the counter. If she overdoes it, she will have incr pain.     Patient is accompained by:  Family member   spouse, [name]   Pertinent History  afib, CAD s/p PTCA, MS with gait disorder/memory loss, lt cca and ICA CVA 7/18  Extensive white matter disease; fall 06/2017 with SAH     Limitations  Walking;Standing    How long can you sit comfortably?  unlimited    How long can you stand comfortably?  <1 minute    How long can you walk comfortably?  none    Diagnostic tests  MRI 12/08/17 with no acute changes    Patient Stated Goals  pt unable to state; husband wants pt to be able to walk again without a device    Currently in Pain?  No/denies    Pain Onset  --  [facility] Adult PT Treatment/Exercise - 04/19/18 0001      Transfers   Transfers  Sit to Stand;Stand to Sit    Sit to Stand  5: Supervision    Sit to Stand Details (indicate cue type and reason)  with and without RW; cont'd vc for safe hand placemnt with RW    Stand to Sit  5: Supervision      Ambulation/Gait   Ambulation/Gait Assistance  4: Min guard;5: Supervision    Ambulation/Gait Assistance Details  supervision with RW; minguard with no device due to poor right foot clearance (improved with min assist to facilitate left wtshift and improve rt foot clearance and step length)    Ambulation Distance (Feet)  120 Feet   x3; 100   Assistive device  Rolling walker;None    Gait Pattern  Step-through pattern;Decreased stride length;Antalgic;Decreased step  length - right;Decreased weight shift to left;Right foot flat;Poor foot clearance - right    Ambulation Surface  Indoor    Stairs  Yes    Stairs Assistance  4: Min guard;5: Supervision    Stairs Assistance Details (indicate cue type and reason)  educated pt/husband in technique to minimize LLE pain as pt walking up to second floor to access recumbent bike    Stair Management Technique  One rail Right;Step to pattern;Forwards    Number of Stairs  4   x 3   Height of Stairs  6          Balance Exercises - 04/19/18 1951      Balance Exercises: Standing   SLS with Vectors  Solid surface;Upper extremity assist 1   heel/toe taps (up to triple taps) foam bubbles for incr SLS   Tandem Gait  Upper extremity support;2 reps    Retro Gait  2 reps;Upper extremity support    Sidestepping  Upper extremity support;2 reps   increased left hip pain   Step Over Hurdles / Cones  6" x 6 reps light UE support    Marching Limitations  along counter light UE support x 3 reps        PT Education - 04/19/18 1955    Education Details  stop sidestepping for HEP, instead do standing hip abduction (less hip pain; likely stepping incr pain due to weightbearing with hip in closed-pack position)    Person(s) Educated  Patient;Spouse    Methods  Explanation;Demonstration;Handout    Comprehension  Verbalized understanding;Returned demonstration       PT Short Term Goals - 04/12/18 1904      PT SHORT TERM GOAL #1   Title  See LTGs        PT Long Term Goals - 04/12/18 1905      PT LONG TERM GOAL #1   Title  Patient (with supervision of husband) will perform updated HEP, to include standing strengthening and balance exercises (Target for all LTGs 05/12/2018)    Time  4    Period  Weeks    Status  Revised      PT LONG TERM GOAL #2   Title  Patient will improve gait velocity to >=2.5 ft/sec to show progress towards safe community ambulation.     Time  4    Period  Weeks    Status  Revised      PT  LONG TERM GOAL #3   Title  Patient will decr TUG with no device to <=13.5 seconds to reflect decr fall risk.     Time  4  Period  Weeks    Status  Revised            Plan - 04/19/18 1959    Clinical Impression Statement  Session focused on gait training without RW with no LOB however noted continued decr wt-shift over LLE and poor rt foot clearance (husband notes when she was very fatigued from walking in the park, she was dragging RLE). Also focused on strengthening and balance with pt tolerating increased activities and demonstrating improved balance (including without RW). Patient can continue to benefit from PT.     Rehab Potential  Good    Clinical Impairments Affecting Rehab Potential  lt thigh pain and decr memory (however husband attends all sessions and assists her with her home program)    PT Frequency  2x / week    PT Duration  4 weeks    PT Treatment/Interventions  ADLs/Self Care Home Management;Gait training;DME Instruction;Stair training;Functional mobility training;Therapeutic activities;Therapeutic exercise;Balance training;Neuromuscular re-education;Cognitive remediation;Orthotic Fit/Training;Patient/family education;Passive range of motion;Manual techniques;Dry needling;Moist Heat;Ultrasound;Taping;Aquatic Therapy   Plan updated to further address left thigh pain; recertification sent to reflect changes   PT Next Visit Plan  continue with use of Nustep or restorator for warm up to decr left hip pain, then continue with LE strengthening, balance activities and gait with no AD (focus on left wt-shift and incr RLE clearance and step length); FYI-pt having incr pain with side stepping and removed from HEP reverting to stand hip abduction--is she tolerating this ex better?      Consulted and Agree with Plan of Care  Patient;Family member/caregiver    Family Member Consulted  [name], husband       Patient will benefit from skilled therapeutic intervention in order to improve the  following deficits and impairments:  Decreased activity tolerance, Decreased balance, Decreased cognition, Decreased mobility, Decreased knowledge of use of DME, Decreased endurance, Decreased range of motion, Decreased strength, Difficulty walking, Impaired flexibility, Postural dysfunction, Pain, Increased muscle spasms, Increased fascial restricitons(TBA if pt cleared for PT)  Visit Diagnosis: Other abnormalities of gait and mobility  Muscle weakness (generalized)  Unsteadiness on feet     Problem List Patient Active Problem List   Diagnosis Date Noted  . History of stroke 11/30/2017  . Cognitive deficit, post-stroke 09/30/2017  . Subarachnoid hemorrhage (HCC) 06/04/2017  . Multiple sclerosis (HCC) 05/12/2017  . CAD (coronary artery disease) 04/12/2017  . Hypertension 04/11/2017  . Right hemiparesis (HCC)   . Aspiration pneumonia of right lung (HCC)   . Leukocytosis   . Hypokalemia   . Labile blood pressure   . Abnormality of gait as late effect of stroke 02/23/2017  . Dysarthria as late effect of stroke 02/23/2017  . Dysphagia as late effect of stroke 02/23/2017  . Acute ischemic left middle cerebral artery (MCA) stroke (HCC) 02/22/2017  . Paroxysmal A-fib (HCC) 02/17/2017  . Atrial fibrillation with RVR (HCC) 02/17/2017  . HLD (hyperlipidemia) 02/17/2017  . Acute hypoxemic respiratory failure (HCC)   . Stroke (cerebrum) (HCC) - Acute MCA infarct due to left CCA/ICA and MCA occlusion, s/p mechanical thrombectomy and left ICA stenting in the setting of newly diagnosed afib 02/14/2017  . Atherosclerotic cardiovascular disease 11/07/2013    Zena Amos, PT 04/19/2018, 8:08 PM  [facility] [address] [place], [place], [address] Phone: [phone]   Fax:  [phone]  Name: LAURALI GODDARD MRN: [id number] Date of Birth: 1946-03-01

## 2018-04-21 ENCOUNTER — Ambulatory Visit: Payer: Medicare HMO | Admitting: Physical Therapy

## 2018-04-21 ENCOUNTER — Encounter: Payer: Self-pay | Admitting: Physical Therapy

## 2018-04-21 DIAGNOSIS — R2689 Other abnormalities of gait and mobility: Secondary | ICD-10-CM

## 2018-04-21 DIAGNOSIS — M79652 Pain in left thigh: Secondary | ICD-10-CM | POA: Diagnosis not present

## 2018-04-21 DIAGNOSIS — M6281 Muscle weakness (generalized): Secondary | ICD-10-CM

## 2018-04-21 DIAGNOSIS — R2681 Unsteadiness on feet: Secondary | ICD-10-CM | POA: Diagnosis not present

## 2018-04-21 NOTE — Therapy (Addendum)
[facility] [address] [place], [place], [address] Phone: [phone]   Fax:  [phone]  Physical Therapy Treatment/Progress Note  Patient Details  Name: Kaitlin [name] MRN: [id number] Date of Birth: 1946/03/19 Referring Provider: Nilda Riggs, NP   Encounter Date: 04/21/2018  PT End of Session - 04/21/18 1024    Visit Number  20    Number of Visits  25    Date for PT Re-Evaluation  05/12/18    Authorization Type  [employer]na Medicare    Authorization Time Period  12/27/17 to 12/27/17; 02/13/18 to 04/14/18; 04/12/18 to 07/11/2018    PT Start Time  1016    PT Stop Time  1100    PT Time Calculation (min)  44 min    Equipment Utilized During Treatment  Gait belt    Activity Tolerance  Patient tolerated treatment well    Behavior During Therapy  [facility] for tasks assessed/performed       Past Medical History:  Diagnosis Date  . Abnormality of gait as late effect of stroke 02/23/2017  . Acute hypoxemic respiratory failure (HCC)   . Atrial fibrillation with RVR (HCC) 02/17/2017  . CAD S/P percutaneous coronary angioplasty 2001  . CVA (cerebral vascular accident) (HCC) 01/2017  . Dysphagia as late effect of stroke 02/23/2017  . HCVD (hypertensive cardiovascular disease) 01/2017   normal LVF with moderate LVH, grade 1 DD  . High cholesterol   . Hypokalemia   . Labile blood pressure   . Leukocytosis   . Multiple sclerosis (HCC)   . PAF (paroxysmal atrial fibrillation) (HCC) 01/2017  . Right hemiparesis [place])     Past Surgical History:  Procedure Laterality Date  . CORONARY ANGIOPLASTY WITH STENT PLACEMENT  11/1999   [facility]CA [facility]  . IR ANGIO INTRA EXTRACRAN SEL COM CAROTID INNOMINATE UNI R MOD SED  02/15/2017  . IR ANGIO VERTEBRAL SEL SUBCLAVIAN INNOMINATE UNI R MOD SED  02/15/2017  . IR INTRAVSC STENT CERV CAROTID W/O EMB-PROT MOD SED INC ANGIO  02/15/2017  . IR PERCUTANEOUS ART THROMBECTOMY/INFUSION INTRACRANIAL  INC DIAG ANGIO  02/15/2017  . IR RADIOLOGIST EVAL & MGMT  05/10/2017  . NECK SURGERY    . RADIOLOGY WITH ANESTHESIA N/A 02/14/2017   Procedure: RADIOLOGY WITH ANESTHESIA;  Surgeon: Radiologist, Medication, MD;  Location: [facility] OR;  Service: Radiology;  Laterality: N/A;  . WRIST SURGERY      There were no vitals filed for this visit.  Subjective Assessment - 04/21/18 1019    Subjective  No new complaints. No pain today. The modified exercise is working well and not causing hip pain (standing hip abduction instead of side stepping).    Pertinent History  afib, CAD s/p PTCA, MS with gait disorder/memory loss, lt cca and ICA CVA 7/18  Extensive white matter disease; fall 06/2017 with SAH     Limitations  Walking;Standing    How long can you sit comfortably?  unlimited    How long can you stand comfortably?  <1 minute    How long can you walk comfortably?  none    Diagnostic tests  MRI 12/08/17 with no acute changes    Patient Stated Goals  pt unable to state; husband wants pt to be able to walk again without a device    Currently in Pain?  No/denies    Pain Score  0-No pain          [facility] Adult PT Treatment/Exercise - 04/21/18 1019  Transfers   Transfers  Sit to Stand;Stand to Sit    Sit to Stand  5: Supervision;With upper extremity assist;Without upper extremity assist;From bed;From chair/3-in-1    Stand to Sit  5: Supervision;With upper extremity assist;Without upper extremity assist;To bed;To chair/3-in-1      Ambulation/Gait   Ambulation/Gait  Yes    Ambulation/Gait Assistance  4: Min guard;4: Min assist    Ambulation/Gait Assistance Details  cues needed for posture, incr step length and incr foot clearance on right. pt moslty needed min guard assist, min assist when providing facilitation for weight shifting to improve foot clearance. Pt was also noted to keep knees (right > left) in slight flexion, with cues this mildly improves.     Ambulation Distance (Feet)  220 Feet   x 2,  plus around gym with activities   Assistive device  None    Gait Pattern  Step-through pattern;Decreased stride length;Antalgic;Decreased step length - right;Decreased weight shift to left;Right foot flat;Poor foot clearance - right;Right flexed knee in stance    Ambulation Surface  Level;Indoor    Gait velocity  14.81 sec's= 2.21 ft/sec no AD      Timed Up and Go Test   Normal TUG (seconds)  14.09   no AD, min guard assist with gait.     High Level Balance   High Level Balance Activities  Marching forwards;Marching backwards;Tandem walking   tandem gait fwd/bwd   High Level Balance Comments  on solid floor next to counter top with single UE support for balance: 3 laps each/each way with up to min assist for balance. verbal/visual cues on ex form/technique and for posture.       Knee/Hip Exercises: Aerobic   Nustep  Level 3 with UE/LE's x 5 minutes for strengthening and activity tolerance.           Balance Exercises - 04/21/18 1054      Balance Exercises: Standing   Standing Eyes Closed  Narrow base of support (BOS);Wide (BOA);Head turns;Foam/compliant surface;Other reps (comment);30 secs;Limitations    Partial Tandem Stance  Eyes closed;Foam/compliant surface;2 reps;30 secs;Limitations    Step Over Hurdles / Cones  4 shorter hurdles in a row next to counter top: reciprocal stepping over forward, then step to stepping pattern bwd over them x 3 laps each with single UE support on counter. min guard to min assist for balance with cues for increased step length/step height and weight shifting; lateral stepping over hurdles in step to pattern with cues/facilitation needed for pelvic position, step length and step placement. the pt was able to incr step length toward left to allow space for both feet, however has significant difficulty doing this when going toward the right side.                           Balance Exercises: Standing   Standing Eyes Closed Limitations  on airex in corner  with a chair in front for safety: wide base of support progressing to narrow base of support with no UE assist- EC no head movements, progressing to EC head movements left<>right, then up<>down. min guard assist for balance with cues on posture during ex.     Tandem Stance Limitations  on airex in corner with a chair in front for safety: modified tandem standing with no UE support for EC no head movements. min assist needed for balance with cues on posture and weight shifitng for balance.  PT Short Term Goals - 04/12/18 1904      PT SHORT TERM GOAL #1   Title  See LTGs        PT Long Term Goals - 04/12/18 1905      PT LONG TERM GOAL #1   Title  Patient (with supervision of husband) will perform updated HEP, to include standing strengthening and balance exercises (Target for all LTGs 05/12/2018)    Time  4    Period  Weeks    Status  Revised      PT LONG TERM GOAL #2   Title  Patient will improve gait velocity to >=2.5 ft/sec to show progress towards safe community ambulation.     Time  4    Period  Weeks    Status  Revised      PT LONG TERM GOAL #3   Title  Patient will decr TUG with no device to <=13.5 seconds to reflect decr fall risk.     Time  4    Period  Weeks    Status  Revised        Plan - 04/21/18 1024    Clinical Impression Statement  Today's skilled session continued to focus on LE strengthening and gait with no AD. No pain was reported with any activities performed today. The pt is progressing well and should benefit from continued PT to progress toward unmet goals.     Rehab Potential  Good    Clinical Impairments Affecting Rehab Potential  lt thigh pain and decr memory (however husband attends all sessions and assists her with her home program)    PT Frequency  2x / week    PT Duration  4 weeks    PT Treatment/Interventions  ADLs/Self Care Home Management;Gait training;DME Instruction;Stair training;Functional mobility training;Therapeutic  activities;Therapeutic exercise;Balance training;Neuromuscular re-education;Cognitive remediation;Orthotic Fit/Training;Patient/family education;Passive range of motion;Manual techniques;Dry needling;Moist Heat;Ultrasound;Taping;Aquatic Therapy   Plan updated to further address left thigh pain; recertification sent to reflect changes   PT Next Visit Plan  continue to work on LE strengthening, balance reactions and gait with no AD being mindful of left LE pain; Nustep for warm up as needed to decrease left hip pain with sessions    Consulted and Agree with Plan of Care  Patient;Family member/caregiver    Family Member Consulted  [name], husband       Patient will benefit from skilled therapeutic intervention in order to improve the following deficits and impairments:  Decreased activity tolerance, Decreased balance, Decreased cognition, Decreased mobility, Decreased knowledge of use of DME, Decreased endurance, Decreased range of motion, Decreased strength, Difficulty walking, Impaired flexibility, Postural dysfunction, Pain, Increased muscle spasms, Increased fascial restricitons(TBA if pt cleared for PT)  Visit Diagnosis: Other abnormalities of gait and mobility  Muscle weakness (generalized)  Unsteadiness on feet  Pain in left thigh     Problem List Patient Active Problem List   Diagnosis Date Noted  . History of stroke 11/30/2017  . Cognitive deficit, post-stroke 09/30/2017  . Subarachnoid hemorrhage (HCC) 06/04/2017  . Multiple sclerosis (HCC) 05/12/2017  . CAD (coronary artery disease) 04/12/2017  . Hypertension 04/11/2017  . Right hemiparesis (HCC)   . Aspiration pneumonia of right lung (HCC)   . Leukocytosis   . Hypokalemia   . Labile blood pressure   . Abnormality of gait as late effect of stroke 02/23/2017  . Dysarthria as late effect of stroke 02/23/2017  . Dysphagia as late effect of stroke 02/23/2017  . Acute ischemic left  middle cerebral artery (MCA) stroke (HCC)  02/22/2017  . Paroxysmal A-fib (HCC) 02/17/2017  . Atrial fibrillation with RVR (HCC) 02/17/2017  . HLD (hyperlipidemia) 02/17/2017  . Acute hypoxemic respiratory failure (HCC)   . Stroke (cerebrum) (HCC) - Acute MCA infarct due to left CCA/ICA and MCA occlusion, s/p mechanical thrombectomy and left ICA stenting in the setting of newly diagnosed afib 02/14/2017  . Atherosclerotic cardiovascular disease 11/07/2013    Sallyanne Kuster, PTA, [facility] Outpatient Neuro [facility] [address] [place], [place] [address] [phone] 04/21/18, 2:28 PM   Name: Kaitlin [name] MRN: [id number] Date of Birth: [birth year]   Physical Therapy Progress Note   Dates of Reporting Period: 03/17/18-04/21/18   Objective Reports of Subjective Statement: Pt is making steady progress since receiving an injection to left hip for decreased pain. Now working on progressing from RW to no AD with gait in sessions and is able to fully participate in PT sessions with limited hip pain reported at times.    Objective Measurements: Timed up and go today was 14.09 sec with no AD, 10 meter gait speed today was 2.21 ft/sec with no AD. Min guard to min assist for balance with testing.   Goal Update: see above   Plan: continue per PT POC   Thank you for the referral of this patient.  Sallyanne Kuster, PTA, [facility] Outpatient Neuro [facility] [address] [place], [place] [address] [phone] 04/21/18, 2:35 PM   Agree with progress note.   Veda Canning, PT Outpatient Neurorehabilitation [address] [place], [place] [address] [phone]

## 2018-04-24 ENCOUNTER — Ambulatory Visit: Payer: Medicare HMO | Admitting: Physical Therapy

## 2018-04-27 ENCOUNTER — Ambulatory Visit: Payer: Medicare HMO | Admitting: Physical Therapy

## 2018-04-27 ENCOUNTER — Encounter: Payer: Self-pay | Admitting: Physical Therapy

## 2018-04-27 ENCOUNTER — Ambulatory Visit: Payer: Medicare HMO

## 2018-04-27 DIAGNOSIS — M6281 Muscle weakness (generalized): Secondary | ICD-10-CM

## 2018-04-27 DIAGNOSIS — M79652 Pain in left thigh: Secondary | ICD-10-CM | POA: Diagnosis not present

## 2018-04-27 DIAGNOSIS — R2689 Other abnormalities of gait and mobility: Secondary | ICD-10-CM | POA: Diagnosis not present

## 2018-04-27 DIAGNOSIS — R2681 Unsteadiness on feet: Secondary | ICD-10-CM | POA: Diagnosis not present

## 2018-04-27 NOTE — Therapy (Signed)
[facility] [address] [place], [place], [address] Phone: [phone]   Fax:  [phone]  Physical Therapy Treatment  Patient Details  Name: Kaitlin [name] MRN: [id number] Date of Birth: 06-06-1946 Referring Provider (PT): Nilda Riggs, NP   Encounter Date: 04/27/2018  PT End of Session - 04/27/18 1943    Visit Number  21    Number of Visits  25    Date for PT Re-Evaluation  05/12/18    Authorization Type  [employer]    Authorization Time Period  12/27/17 to 12/27/17; 02/13/18 to 04/14/18; 04/12/18 to 07/11/2018    PT Start Time  1020    PT Stop Time  1102    PT Time Calculation (min)  42 min    Equipment Utilized During Treatment  Gait belt    Activity Tolerance  Patient tolerated treatment well    Behavior During Therapy  [facility] for tasks assessed/performed       Past Medical History:  Diagnosis Date  . Abnormality of gait as late effect of stroke 02/23/2017  . Acute hypoxemic respiratory failure (HCC)   . Atrial fibrillation with RVR (HCC) 02/17/2017  . CAD S/P percutaneous coronary angioplasty 2001  . CVA (cerebral vascular accident) (HCC) 01/2017  . Dysphagia as late effect of stroke 02/23/2017  . HCVD (hypertensive cardiovascular disease) 01/2017   normal LVF with moderate LVH, grade 1 DD  . High cholesterol   . Hypokalemia   . Labile blood pressure   . Leukocytosis   . Multiple sclerosis (HCC)   . PAF (paroxysmal atrial fibrillation) (HCC) 01/2017  . Right hemiparesis [facility])     Past Surgical History:  Procedure Laterality Date  . CORONARY ANGIOPLASTY WITH STENT PLACEMENT  11/1999   [facility]CA DES[facility]  . IR ANGIO INTRA EXTRACRAN SEL COM CAROTID INNOMINATE UNI R MOD SED  02/15/2017  . IR ANGIO VERTEBRAL SEL SUBCLAVIAN INNOMINATE UNI R MOD SED  02/15/2017  . IR INTRAVSC STENT CERV CAROTID W/O EMB-PROT MOD SED INC ANGIO  02/15/2017  . IR PERCUTANEOUS ART THROMBECTOMY/INFUSION INTRACRANIAL INC DIAG  ANGIO  02/15/2017  . IR RADIOLOGIST EVAL & MGMT  05/10/2017  . NECK SURGERY    . RADIOLOGY WITH ANESTHESIA N/A 02/14/2017   Procedure: RADIOLOGY WITH ANESTHESIA;  Surgeon: Radiologist, Medication, MD;  Location: [facility] OR;  Service: Radiology;  Laterality: N/A;  . WRIST SURGERY      There were no vitals filed for this visit.  Subjective Assessment - 04/27/18 1024    Subjective  No changes. Husband reports pt has progressed from doing 8 minutes on recumbent bike up to 10 minutes. Husband reports they used to enjoy hiking and asking if she'll ever be able to do that again.    Pertinent History  afib, CAD s/p PTCA, MS with gait disorder/memory loss, lt cca and ICA CVA 7/18  Extensive white matter disease; fall 06/2017 with SAH     Limitations  Walking;Standing    How long can you sit comfortably?  unlimited    How long can you stand comfortably?  <1 minute    How long can you walk comfortably?  none    Diagnostic tests  MRI 12/08/17 with no acute changes    Patient Stated Goals  pt unable to state; husband wants pt to be able to walk again without a device    Currently in Pain?  No/denies  [facility] Adult PT Treatment/Exercise - 04/27/18 1043      Transfers   Transfers  Sit to Stand;Stand to Sit    Sit to Stand  5: Supervision;With upper extremity assist;Without upper extremity assist;From bed;From chair/3-in-1    Sit to Stand Details (indicate cue type and reason)  with and without RW; cont'd vc for safe hand placemnt with RW    Stand to Sit  5: Supervision      Ambulation/Gait   Ambulation/Gait Assistance  4: Min guard    Ambulation/Gait Assistance Details  without RW close guarding, no LOB with walking only    Ambulation Distance (Feet)  120 Feet   x 4 throughout session   Assistive device  None    Gait Pattern  Step-through pattern;Decreased stride length;Antalgic;Decreased step length - right;Decreased weight shift to left;Right foot flat;Poor foot  clearance - right;Right flexed knee in stance    Ambulation Surface  Indoor    Stairs Assistance  5: Supervision    Stairs Assistance Details (indicate cue type and reason)  supervision for safety due to cognition    Stair Management Technique  One rail Right;Step to pattern;Forwards    Number of Stairs  4   x2   Height of Stairs  6    Ramp  5: Supervision    Ramp Details (indicate cue type and reason)  very short steps when descending, no imbalance; supervision for safety      Knee/Hip Exercises: Aerobic   Nustep  L3 with UEs x 2 min; L4 x 4 min          Balance Exercises - 04/27/18 1046      Balance Exercises: Standing   SLS with Vectors  --   back to counter, 3 targets in semi-circle, alternating taps    Rockerboard  Anterior/posterior;Head turns;EO;EC   forward bow & up x 10   Balance Beam  blue beam tandem stance x 30 sec (LLE in front); then step down with left foot and back on in front of rt foot    Tandem Gait  Forward;Intermittent upper extremity support;2 reps    Retro Gait  Upper extremity support;2 reps    Step Over Hurdles / Cones  intermixed short and taller hurdles (?6" and 8") forward and backward stepping over with light UE support on counter    Other Standing Exercises  upfacing on ramp; EC x 30 sec; EC with head turns/nods        PT Education - 04/27/18 1111    Education Details  discussed her progress and OK to walk inside home without her RW WITH her husband near by; goal of exercising 3 days/week when stops PT (using HEP and recumbent bike or walking program); possible return to "easy" hiking path, however risk of increasing hip pain with walking on uneven terrain    Person(s) Educated  Patient;Spouse    Methods  Explanation    Comprehension  Verbalized understanding       PT Short Term Goals - 04/12/18 1904      PT SHORT TERM GOAL #1   Title  See LTGs        PT Long Term Goals - 04/12/18 1905      PT LONG TERM GOAL #1   Title  Patient (with  supervision of husband) will perform updated HEP, to include standing strengthening and balance exercises (Target for all LTGs 05/12/2018)    Time  4    Period  Weeks    Status  Revised      PT LONG TERM GOAL #2   Title  Patient will improve gait velocity to >=2.5 ft/sec to show progress towards safe community ambulation.     Time  4    Period  Weeks    Status  Revised      PT LONG TERM GOAL #3   Title  Patient will decr TUG with no device to <=13.5 seconds to reflect decr fall risk.     Time  4    Period  Weeks    Status  Revised            Plan - 04/27/18 1944    Clinical Impression Statement  Focused on gait training without a device with continuing to improve in her balance, balance training with compliant surfaces to increase use of vestibular system, strength training for bil hips, and pt/spouse education. She continues to tolerate more and more activity now that left thigh/hip pain is under better control.     Rehab Potential  Good    Clinical Impairments Affecting Rehab Potential  lt thigh pain and decr memory (however husband attends all sessions and assists her with her home program)    PT Frequency  2x / week    PT Duration  4 weeks    PT Treatment/Interventions  ADLs/Self Care Home Management;Gait training;DME Instruction;Stair training;Functional mobility training;Therapeutic activities;Therapeutic exercise;Balance training;Neuromuscular re-education;Cognitive remediation;Orthotic Fit/Training;Patient/family education;Passive range of motion;Manual techniques;Dry needling;Moist Heat;Ultrasound;Taping;Aquatic Therapy   Plan updated to further address left thigh pain; recertification sent to reflect changes   PT Next Visit Plan  Nustep for warm up as needed vs working on longer walks without RW; continue to work on LE strengthening, balance reactions and gait with no AD being mindful of left LE pain; check     Consulted and Agree with Plan of Care  Patient;Family  member/caregiver    Family Member Consulted  [name], husband       Patient will benefit from skilled therapeutic intervention in order to improve the following deficits and impairments:  Decreased activity tolerance, Decreased balance, Decreased cognition, Decreased mobility, Decreased knowledge of use of DME, Decreased endurance, Decreased range of motion, Decreased strength, Difficulty walking, Impaired flexibility, Postural dysfunction, Pain, Increased muscle spasms, Increased fascial restricitons(TBA if pt cleared for PT)  Visit Diagnosis: Other abnormalities of gait and mobility  Muscle weakness (generalized)  Unsteadiness on feet     Problem List Patient Active Problem List   Diagnosis Date Noted  . History of stroke 11/30/2017  . Cognitive deficit, post-stroke 09/30/2017  . Subarachnoid hemorrhage (HCC) 06/04/2017  . Multiple sclerosis (HCC) 05/12/2017  . CAD (coronary artery disease) 04/12/2017  . Hypertension 04/11/2017  . Right hemiparesis (HCC)   . Aspiration pneumonia of right lung (HCC)   . Leukocytosis   . Hypokalemia   . Labile blood pressure   . Abnormality of gait as late effect of stroke 02/23/2017  . Dysarthria as late effect of stroke 02/23/2017  . Dysphagia as late effect of stroke 02/23/2017  . Acute ischemic left middle cerebral artery (MCA) stroke (HCC) 02/22/2017  . Paroxysmal A-fib (HCC) 02/17/2017  . Atrial fibrillation with RVR (HCC) 02/17/2017  . HLD (hyperlipidemia) 02/17/2017  . Acute hypoxemic respiratory failure (HCC)   . Stroke (cerebrum) (HCC) - Acute MCA infarct due to left CCA/ICA and MCA occlusion, s/p mechanical thrombectomy and left ICA stenting in the setting of newly diagnosed afib 02/14/2017  . Atherosclerotic cardiovascular disease 11/07/2013    Zena Amos,  PT 04/27/2018, 7:51 PM  [facility] [address] [place], [place], [address] Phone: [phone]   Fax:   [phone]  Name: BRIDGETT HATTABAUGH MRN: [id number] Date of Birth: 05/03/1946

## 2018-04-28 ENCOUNTER — Ambulatory Visit: Payer: Medicare HMO | Admitting: Physical Therapy

## 2018-05-01 ENCOUNTER — Other Ambulatory Visit: Payer: Medicare HMO | Admitting: *Deleted

## 2018-05-01 DIAGNOSIS — R748 Abnormal levels of other serum enzymes: Secondary | ICD-10-CM | POA: Diagnosis not present

## 2018-05-01 DIAGNOSIS — R932 Abnormal findings on diagnostic imaging of liver and biliary tract: Secondary | ICD-10-CM | POA: Diagnosis not present

## 2018-05-01 DIAGNOSIS — Z789 Other specified health status: Secondary | ICD-10-CM | POA: Diagnosis not present

## 2018-05-02 ENCOUNTER — Telehealth: Payer: Self-pay | Admitting: *Deleted

## 2018-05-02 DIAGNOSIS — R7401 Elevation of levels of liver transaminase levels: Secondary | ICD-10-CM | POA: Insufficient documentation

## 2018-05-02 DIAGNOSIS — R740 Nonspecific elevation of levels of transaminase and lactic acid dehydrogenase [LDH]: Secondary | ICD-10-CM

## 2018-05-02 DIAGNOSIS — R945 Abnormal results of liver function studies: Principal | ICD-10-CM

## 2018-05-02 DIAGNOSIS — R7989 Other specified abnormal findings of blood chemistry: Secondary | ICD-10-CM

## 2018-05-02 LAB — HEPATIC FUNCTION PANEL
ALT: 102 IU/L — ABNORMAL HIGH (ref 0–32)
AST: 94 IU/L — ABNORMAL HIGH (ref 0–40)
Albumin: 4.3 g/dL (ref 3.5–4.8)
Alkaline Phosphatase: 74 IU/L (ref 39–117)
Bilirubin Total: 0.3 mg/dL (ref 0.0–1.2)
Bilirubin, Direct: 0.12 mg/dL (ref 0.00–0.40)
Total Protein: 6.7 g/dL (ref 6.0–8.5)

## 2018-05-02 NOTE — Telephone Encounter (Signed)
-----   Message from Lars Masson, MD sent at 05/02/2018  4:12 PM EDT ----- Thank you! Zaydrian Batta, please refer to GI. K ----- Message ----- From: Awilda Metro, [facility] Sent: 05/02/2018   4:02 PM EDT To: Lars Masson, MD  Would make sure that pt is not using excessive Tylenol or alcohol. If not, would recommend GI referral to rule out other causes of elevated LFTs. I'd prefer that LFTs start to trend down more before potentially rechallenging with another statin. Low dose Crestor or Livalo typically cause less LFT elevations than Lipitor, however would expect LFTs to be trending down more quickly if elevation was statin-induced. LDL was 115 earlier this year - both Zetia and PCSK9i are options as well and shouldn't affect LFTs - we can discuss these at her visit with [place] on 10/3 rather than statin rechallenge if you prefer.

## 2018-05-02 NOTE — Telephone Encounter (Signed)
Spoke with the pt and Husband and clarified if she was taking Tylenol or drinking alcohol, and both clarified that the pt is taking NO tylenol, and pt very rarely drinks a glass of wine, but not on a daily basis.  Advised the pt and spouse to avoid both.  Also informed both parties that per Dr Delton See, the pt should still come in to see our Pharmacist in Lipid clinic on 10/3, and we will refer the pt to GI for further evaluation of elevated LFTs.  Informed both parties that I will place the referral in the system and have our [facility] schedulers call them back to arrange this new pt appt.  Both parties verbalized understanding and agrees with this plan.

## 2018-05-03 ENCOUNTER — Encounter: Payer: Self-pay | Admitting: Internal Medicine

## 2018-05-04 ENCOUNTER — Ambulatory Visit (INDEPENDENT_AMBULATORY_CARE_PROVIDER_SITE_OTHER): Payer: Medicare HMO | Admitting: Pharmacist

## 2018-05-04 ENCOUNTER — Ambulatory Visit: Payer: Medicare HMO | Attending: Nurse Practitioner | Admitting: Physical Therapy

## 2018-05-04 ENCOUNTER — Encounter: Payer: Self-pay | Admitting: Physical Therapy

## 2018-05-04 ENCOUNTER — Ambulatory Visit: Payer: Medicare HMO

## 2018-05-04 DIAGNOSIS — R2681 Unsteadiness on feet: Secondary | ICD-10-CM | POA: Insufficient documentation

## 2018-05-04 DIAGNOSIS — M6281 Muscle weakness (generalized): Secondary | ICD-10-CM | POA: Insufficient documentation

## 2018-05-04 DIAGNOSIS — E782 Mixed hyperlipidemia: Secondary | ICD-10-CM

## 2018-05-04 DIAGNOSIS — R2689 Other abnormalities of gait and mobility: Secondary | ICD-10-CM

## 2018-05-04 DIAGNOSIS — M79652 Pain in left thigh: Secondary | ICD-10-CM | POA: Diagnosis not present

## 2018-05-04 MED ORDER — EVOLOCUMAB 140 MG/ML ~~LOC~~ SOAJ: 1.0000 "pen " | SUBCUTANEOUS | 11 refills | Status: DC

## 2018-05-04 NOTE — Progress Notes (Signed)
Patient ID: Kaitlin [name]                 DOB: 1946-06-05                    MRN: [id number]     HPI: Kaitlin [name] is a 72 y.o. female patient referred to lipid clinic by Dr Delton See for history of CAD s/p PCI, statin intolerance, and elevated LFTs. PMH also significant for HTN, afib, MS, and CVA in July 2018. She previously took atorvastatin however LFTs increased to 2-3x ULN. Pt has since discontinued atorvastatin and ALT remains elevated ~100. GI referral has been placed and pt presents to clinic for further lipid management.  Pt presents today with her husband. Both of their parents were pharmacists. Pt most recently took atorvastatin, however LFTs were elevated 2-3x ULN. She has taken pravastatin in the past which caused muscle aches. Pt did take interferon beta injections in the past for MS and after 18 years they caused her LFTs to increase. LFTs normalized after treatment discontinuation, however pt is no longer on this medication. She has a GI appt scheduled with Dr Marina Goodell for November 4th to rule out any other potential causes of LFT elevation.  Current Medications: none Intolerances: atorvastatin 40mg  daily - ? LFT elevations, pravastatin 10mg  daily - leg pain, simvastatin Risk Factors: CAD s/p PCI, CVA, HTN LDL goal: 70mg /dL  Diet:  Breakfast - fruit, thinly sliced bagel with chicken sausage and tomato occasionally, sometimes egg or waffle. Lunch - sandwich, salad, pizza Dinner - stew with vegetables, shrimp stew, most meals at home  Exercise: Going to PT now (stroke last year), exercise bike at home. Ambulates with a walker after recent leg injury.  Family History: Father with history of heart disease.  Social History: Drinks wine occasionally, denies alcohol and illicit drug use.  Labs: 11/21/17: TC 221, TG 101, HDL 86, LDL 115 (atorvastatin 40mg  daily)   Past Medical History:  Diagnosis Date  . Abnormality of gait as late effect of stroke 02/23/2017  . Acute hypoxemic  respiratory failure (HCC)   . Atrial fibrillation with RVR (HCC) 02/17/2017  . CAD S/P percutaneous coronary angioplasty 2001  . CVA (cerebral vascular accident) (HCC) 01/2017  . Dysphagia as late effect of stroke 02/23/2017  . HCVD (hypertensive cardiovascular disease) 01/2017   normal LVF with moderate LVH, grade 1 DD  . High cholesterol   . Hypokalemia   . Labile blood pressure   . Leukocytosis   . Multiple sclerosis (HCC)   . PAF (paroxysmal atrial fibrillation) (HCC) 01/2017  . Right hemiparesis [facility])     Current Outpatient Medications on File Prior to Visit  Medication Sig Dispense Refill  . Calcium Citrate-Vitamin D (CALCIUM CITRATE + D PO) Take 1 tablet by mouth daily.    . cholecalciferol (VITAMIN D) 1000 units tablet Take 3,000 Units by mouth daily.    . clopidogrel (PLAVIX) 75 MG tablet Take 1 tablet (75 mg total) by mouth daily. 30 tablet 0  . FLUoxetine (PROZAC) 10 MG capsule Take 1 capsule (10 mg total) by mouth daily. 30 capsule 0  . metoprolol tartrate (LOPRESSOR) 50 MG tablet Take 1 tablet (50 mg total) by mouth 2 (two) times daily. 60 tablet 0  . Multiple Vitamins-Minerals (PRESERVISION AREDS PO) Take 1 tablet by mouth 2 (two) times daily.    . pantoprazole (PROTONIX) 40 MG tablet Take 1 tablet (40 mg total) by mouth daily. 30 tablet 0  .  Rivaroxaban (XARELTO) 15 MG TABS tablet Take 1 tablet (15 mg total) by mouth daily with supper. 30 tablet 0   No current facility-administered medications on file prior to visit.     Allergies  Allergen Reactions  . Eliquis [Apixaban] Rash  . Penicillins Other (See Comments)    From childhood     Assessment/Plan:  1. Hyperlipidemia - LDL 115 on atorvastatin 40mg  daily, above goal < 70 due to hx of CAD s/p PCI. Pt now off of atorvastatin due to LFT elevations. She has previously taken pravastatin (caused myalgias) and simvastatin. Zetia therapy will not bring LDL to goal as baseline LDL off of statin is likely closer to 200.  Discussed PCSK9i therapy including expected benefit, side effects, and injection technique. Will submit prior authorization and follow up with pt once copay information is available. Pt will not qualify for patient assistance. Once pt sees GI in November, may be able to resume statin therapy if this is felt to be safe.   Kaitlin [name], PharmD, BCACP, CPP  [facility] [address], [place], [place] [address] Phone: [phone]; Fax: [phone] 05/04/2018 3:36 PM   Addendum: PA for Repatha approved, rx sent to local pharmacy to determine copay.

## 2018-05-04 NOTE — Patient Instructions (Signed)
It was nice to meet you today  Your LDL cholesterol goal is < 70  We will start the paperwork for Repatha shots  Call Diondre Pulis, Pharmacist with any questions #[phone]

## 2018-05-05 ENCOUNTER — Encounter: Payer: Self-pay | Admitting: Physical Therapy

## 2018-05-05 ENCOUNTER — Ambulatory Visit: Payer: Medicare HMO | Admitting: Physical Therapy

## 2018-05-05 DIAGNOSIS — M79652 Pain in left thigh: Secondary | ICD-10-CM | POA: Diagnosis not present

## 2018-05-05 DIAGNOSIS — R2681 Unsteadiness on feet: Secondary | ICD-10-CM | POA: Diagnosis not present

## 2018-05-05 DIAGNOSIS — M6281 Muscle weakness (generalized): Secondary | ICD-10-CM | POA: Diagnosis not present

## 2018-05-05 DIAGNOSIS — R2689 Other abnormalities of gait and mobility: Secondary | ICD-10-CM

## 2018-05-05 NOTE — Therapy (Signed)
[facility] [address] [place], [place], [address] Phone: [phone]   Fax:  [phone]  Physical Therapy Treatment  Patient Details  Name: Kaitlin [name] MRN: [id number] Date of Birth: [birth year] Referring Provider (PT): Nilda Riggs, NP   Encounter Date: 05/04/2018  PT End of Session - 05/04/18 0809    Visit Number  22    Number of Visits  25    Date for PT Re-Evaluation  05/12/18    Authorization Type  [employer] Medicare    Authorization Time Period  12/27/17 to 12/27/17; 02/13/18 to 04/14/18; 04/12/18 to 07/11/2018    PT Start Time  0800    PT Stop Time  0844    PT Time Calculation (min)  44 min    Equipment Utilized During Treatment  --    Activity Tolerance  Patient tolerated treatment well    Behavior During Therapy  [facility] for tasks assessed/performed       Past Medical History:  Diagnosis Date  . Abnormality of gait as late effect of stroke 02/23/2017  . Acute hypoxemic respiratory failure (HCC)   . Atrial fibrillation with RVR (HCC) 02/17/2017  . CAD S/P percutaneous coronary angioplasty 2001  . CVA (cerebral vascular accident) (HCC) 01/2017  . Dysphagia as late effect of stroke 02/23/2017  . HCVD (hypertensive cardiovascular disease) 01/2017   normal LVF with moderate LVH, grade 1 DD  . High cholesterol   . Hypokalemia   . Labile blood pressure   . Leukocytosis   . Multiple sclerosis (HCC)   . PAF (paroxysmal atrial fibrillation) (HCC) 01/2017  . Right hemiparesis [facility])     Past Surgical History:  Procedure Laterality Date  . CORONARY ANGIOPLASTY WITH STENT PLACEMENT  11/1999   RCA DES-[facility]  . IR ANGIO INTRA EXTRACRAN SEL COM CAROTID INNOMINATE UNI R MOD SED  02/15/2017  . IR ANGIO VERTEBRAL SEL SUBCLAVIAN INNOMINATE UNI R MOD SED  02/15/2017  . IR INTRAVSC STENT CERV CAROTID W/O EMB-PROT MOD SED INC ANGIO  02/15/2017  . IR PERCUTANEOUS ART THROMBECTOMY/INFUSION INTRACRANIAL INC DIAG ANGIO   02/15/2017  . IR RADIOLOGIST EVAL & MGMT  05/10/2017  . NECK SURGERY    . RADIOLOGY WITH ANESTHESIA N/A 02/14/2017   Procedure: RADIOLOGY WITH ANESTHESIA;  Surgeon: Radiologist, Medication, MD;  Location: [facility] OR;  Service: Radiology;  Laterality: N/A;  . WRIST SURGERY      There were no vitals filed for this visit.  Subjective Assessment - 05/04/18 0804    Subjective  At home mostly walking with no device and hip pain has been controlled. Able to walk outside with arms linked with husband and does well. Only has hip pain after prolonged standing.     Pertinent History  afib, CAD s/p PTCA, MS with gait disorder/memory loss, lt cca and ICA CVA 7/18  Extensive white matter disease; fall 06/2017 with SAH     Limitations  Walking;Standing    How long can you sit comfortably?  unlimited    How long can you stand comfortably?  <1 minute    How long can you walk comfortably?  none    Diagnostic tests  MRI 12/08/17 with no acute changes    Patient Stated Goals  pt unable to state; husband wants pt to be able to walk again without a device    Currently in Pain?  No/denies  [facility] Adult PT Treatment/Exercise - 05/04/18 0810      Transfers   Transfers  Sit to Stand;Stand to Sit    Sit to Stand  5: Supervision;With upper extremity assist;Without upper extremity assist;From bed;From chair/3-in-1    Sit to Stand Details (indicate cue type and reason)  with and without RW; cont'd vc for safe hand placemnt with RW    Stand to Sit  5: Supervision      Ambulation/Gait   Ambulation/Gait Assistance  4: Min guard    Ambulation/Gait Assistance Details  without RW close guarding, no LOB with walking only    Ambulation Distance (Feet)  100 Feet   120   Assistive device  None    Gait Pattern  Step-through pattern;Decreased stride length;Antalgic;Decreased step length - right;Decreased weight shift to left;Right foot flat;Poor foot clearance - right;Right flexed knee in stance       Knee/Hip Exercises: Aerobic   Nustep  L3 x 4 min warmup          Balance Exercises - 05/04/18 0823      Balance Exercises: Standing   SLS  Eyes open;Upper extremity support 1   on rockerboard with raised leg hip flex, abd, ext   Rockerboard  Anterior/posterior;EO;Intermittent UE support   hip strategy and recovery   Retro Gait  Upper extremity support;4 reps    Marching Limitations  1 length // bars with incr left hip pain          PT Short Term Goals - 04/12/18 1904      PT SHORT TERM GOAL #1   Title  See LTGs        PT Long Term Goals - 04/12/18 1905      PT LONG TERM GOAL #1   Title  Patient (with supervision of husband) will perform updated HEP, to include standing strengthening and balance exercises (Target for all LTGs 05/12/2018)    Time  4    Period  Weeks    Status  Revised      PT LONG TERM GOAL #2   Title  Patient will improve gait velocity to >=2.5 ft/sec to show progress towards safe community ambulation.     Time  4    Period  Weeks    Status  Revised      PT LONG TERM GOAL #3   Title  Patient will decr TUG with no device to <=13.5 seconds to reflect decr fall risk.     Time  4    Period  Weeks    Status  Revised            Plan - 05/04/18 1700    Clinical Impression Statement  On arrival, pt with no left hip/thigh pain. Session included gait, balance and stretching of LLE with pt reporting pain up to 2-3/10 by end of session. She continues to make slow progress--limited by severe lt hip arthritis.     Rehab Potential  Good    Clinical Impairments Affecting Rehab Potential  lt thigh pain and decr memory (however husband attends all sessions and assists her with her home program)    PT Frequency  2x / week    PT Duration  4 weeks    PT Treatment/Interventions  ADLs/Self Care Home Management;Gait training;DME Instruction;Stair training;Functional mobility training;Therapeutic activities;Therapeutic exercise;Balance  training;Neuromuscular re-education;Cognitive remediation;Orthotic Fit/Training;Patient/family education;Passive range of motion;Manual techniques;Dry needling;Moist Heat;Ultrasound;Taping;Aquatic Therapy   Plan updated to further address left thigh pain; recertification sent to reflect changes  PT Next Visit Plan  restorator for warm up; check HEP stretches for LLE and update as approopriate; continue to work on LE strengthening, balance reactions and gait with no AD being mindful of left LE pain    Consulted and Agree with Plan of Care  Patient;Family member/caregiver    Family Member Consulted  [name], husband       Patient will benefit from skilled therapeutic intervention in order to improve the following deficits and impairments:  Decreased activity tolerance, Decreased balance, Decreased cognition, Decreased mobility, Decreased knowledge of use of DME, Decreased endurance, Decreased range of motion, Decreased strength, Difficulty walking, Impaired flexibility, Postural dysfunction, Pain, Increased muscle spasms, Increased fascial restricitons(TBA if pt cleared for PT)  Visit Diagnosis: Other abnormalities of gait and mobility  Muscle weakness (generalized)  Unsteadiness on feet  Pain in left thigh     Problem List Patient Active Problem List   Diagnosis Date Noted  . Elevated liver function tests 05/02/2018  . Elevated alanine aminotransferase (ALT) level 05/02/2018  . Elevated AST (SGOT) 05/02/2018  . History of stroke 11/30/2017  . Cognitive deficit, post-stroke 09/30/2017  . Subarachnoid hemorrhage (HCC) 06/04/2017  . Multiple sclerosis (HCC) 05/12/2017  . CAD (coronary artery disease) 04/12/2017  . Hypertension 04/11/2017  . Right hemiparesis (HCC)   . Aspiration pneumonia of right lung (HCC)   . Leukocytosis   . Hypokalemia   . Labile blood pressure   . Abnormality of gait as late effect of stroke 02/23/2017  . Dysarthria as late effect of stroke 02/23/2017  .  Dysphagia as late effect of stroke 02/23/2017  . Acute ischemic left middle cerebral artery (MCA) stroke (HCC) 02/22/2017  . Paroxysmal A-fib (HCC) 02/17/2017  . Atrial fibrillation with RVR (HCC) 02/17/2017  . HLD (hyperlipidemia) 02/17/2017  . Acute hypoxemic respiratory failure (HCC)   . Stroke (cerebrum) (HCC) - Acute MCA infarct due to left CCA/ICA and MCA occlusion, s/p mechanical thrombectomy and left ICA stenting in the setting of newly diagnosed afib 02/14/2017  . Atherosclerotic cardiovascular disease 11/07/2013    Zena Amos, PT 05/05/2018, 5:37 AM  [facility] [address] [place], [place], [address] Phone: [phone]   Fax:  [phone]  Name: Kaitlin [name] MRN: [id number] Date of Birth: Nov 04, 1945

## 2018-05-05 NOTE — Therapy (Signed)
[facility] [address] [place], [place], [address] Phone: [phone]   Fax:  [phone]  Physical Therapy Treatment  Patient Details  Name: Kaitlin [name] MRN: [id number] Date of Birth: 1945/09/24 Referring Provider (PT): Nilda Riggs, NP   Encounter Date: 05/05/2018  PT End of Session - 05/05/18 0959    Visit Number  23    Number of Visits  25    Date for PT Re-Evaluation  05/12/18    Authorization Type  [employer]na Medicare    Authorization Time Period  12/27/17 to 12/27/17; 02/13/18 to 04/14/18; 04/12/18 to 07/11/2018    PT Start Time  0935    PT Stop Time  1015    PT Time Calculation (min)  40 min    Activity Tolerance  Patient tolerated treatment well    Behavior During Therapy  [facility] for tasks assessed/performed       Past Medical History:  Diagnosis Date  . Abnormality of gait as late effect of stroke 02/23/2017  . Acute hypoxemic respiratory failure (HCC)   . Atrial fibrillation with RVR (HCC) 02/17/2017  . CAD S/P percutaneous coronary angioplasty 2001  . CVA (cerebral vascular accident) (HCC) 01/2017  . Dysphagia as late effect of stroke 02/23/2017  . HCVD (hypertensive cardiovascular disease) 01/2017   normal LVF with moderate LVH, grade 1 DD  . High cholesterol   . Hypokalemia   . Labile blood pressure   . Leukocytosis   . Multiple sclerosis (HCC)   . PAF (paroxysmal atrial fibrillation) (HCC) 01/2017  . Right hemiparesis [facility])     Past Surgical History:  Procedure Laterality Date  . CORONARY ANGIOPLASTY WITH STENT PLACEMENT  11/1999   RCA DES[facility]  . IR ANGIO INTRA EXTRACRAN SEL COM CAROTID INNOMINATE UNI R MOD SED  02/15/2017  . IR ANGIO VERTEBRAL SEL SUBCLAVIAN INNOMINATE UNI R MOD SED  02/15/2017  . IR INTRAVSC STENT CERV CAROTID W/O EMB-PROT MOD SED INC ANGIO  02/15/2017  . IR PERCUTANEOUS ART THROMBECTOMY/INFUSION INTRACRANIAL INC DIAG ANGIO  02/15/2017  . IR RADIOLOGIST EVAL & MGMT   05/10/2017  . NECK SURGERY    . RADIOLOGY WITH ANESTHESIA N/A 02/14/2017   Procedure: RADIOLOGY WITH ANESTHESIA;  Surgeon: Radiologist, Medication, MD;  Location: [facility] OR;  Service: Radiology;  Laterality: N/A;  . WRIST SURGERY      There were no vitals filed for this visit.  Subjective Assessment - 05/05/18 0939    Subjective  Husband went to purchase foam for corner exercise and could not find foam. Hip is a little more sore today.     Pertinent History  afib, CAD s/p PTCA, MS with gait disorder/memory loss, lt cca and ICA CVA 7/18  Extensive white matter disease; fall 06/2017 with SAH     Limitations  Walking;Standing    How long can you sit comfortably?  unlimited    How long can you stand comfortably?  <1 minute    How long can you walk comfortably?  none    Diagnostic tests  MRI 12/08/17 with no acute changes    Patient Stated Goals  pt unable to state; husband wants pt to be able to walk again without a device    Currently in Pain?  Yes    Pain Score  2     Pain Location  Hip    Pain Orientation  Left    Pain Descriptors / Indicators  Aching    Pain Type  Chronic pain;Intractable  pain    Pain Onset  More than a month ago    Pain Frequency  Intermittent    Aggravating Factors   certain activities    Pain Relieving Factors  rest                        [facility] Adult PT Treatment/Exercise - 05/05/18 0946      Transfers   Transfers  Sit to Stand;Stand to Sit    Sit to Stand  5: Supervision;With upper extremity assist;Without upper extremity assist;From bed;From chair/3-in-1    Sit to Stand Details (indicate cue type and reason)  with and without RW; cont'd vc for safe hand placemnt with RW    Stand to Sit  5: Supervision      Ambulation/Gait   Ambulation/Gait Assistance  4: Min guard;5: Supervision    Ambulation/Gait Assistance Details  supervison with cues for proximity to RW and posture; no device closeguarding due to hip pain    Ambulation Distance (Feet)  100  Feet   80, 60   Assistive device  Rolling walker;None;1 person hand held assist    Gait Pattern  Step-through pattern;Decreased stride length;Antalgic;Decreased step length - right;Decreased weight shift to left;Right foot flat;Poor foot clearance - right;Right flexed knee in stance    Ambulation Surface  Indoor    Stairs Assistance  5: Supervision    Stair Management Technique  One rail Right;Step to pattern;Forwards    Number of Stairs  4    Height of Stairs  6      Knee/Hip Exercises: Stretches   Piriformis Stretch  Left;2 reps;30 seconds    Other Knee/Hip Stretches  rt sidelying, left hip knee flexed to 90 for Lateral thigh stretch      Knee/Hip Exercises: Aerobic   Other Aerobic  restorator x 4 minutes warmup             PT Education - 05/05/18 1916    Education Details  addition to HEP; finalized exercises in HEP    Person(s) Educated  Patient;Spouse    Methods  Explanation;Demonstration;Verbal cues;Handout    Comprehension  Verbalized understanding;Returned demonstration;Verbal cues required       PT Short Term Goals - 04/12/18 1904      PT SHORT TERM GOAL #1   Title  See LTGs        PT Long Term Goals - 04/12/18 1905      PT LONG TERM GOAL #1   Title  Patient (with supervision of husband) will perform updated HEP, to include standing strengthening and balance exercises (Target for all LTGs 05/12/2018)    Time  4    Period  Weeks    Status  Revised      PT LONG TERM GOAL #2   Title  Patient will improve gait velocity to >=2.5 ft/sec to show progress towards safe community ambulation.     Time  4    Period  Weeks    Status  Revised      PT LONG TERM GOAL #3   Title  Patient will decr TUG with no device to <=13.5 seconds to reflect decr fall risk.     Time  4    Period  Weeks    Status  Revised              Patient will benefit from skilled therapeutic intervention in order to improve the following deficits and impairments:     Visit  Diagnosis: Other abnormalities of gait and mobility  Pain in left thigh     Problem List Patient Active Problem List   Diagnosis Date Noted  . Elevated liver function tests 05/02/2018  . Elevated alanine aminotransferase (ALT) level 05/02/2018  . Elevated AST (SGOT) 05/02/2018  . History of stroke 11/30/2017  . Cognitive deficit, post-stroke 09/30/2017  . Subarachnoid hemorrhage (HCC) 06/04/2017  . Multiple sclerosis (HCC) 05/12/2017  . CAD (coronary artery disease) 04/12/2017  . Hypertension 04/11/2017  . Right hemiparesis (HCC)   . Aspiration pneumonia of right lung (HCC)   . Leukocytosis   . Hypokalemia   . Labile blood pressure   . Abnormality of gait as late effect of stroke 02/23/2017  . Dysarthria as late effect of stroke 02/23/2017  . Dysphagia as late effect of stroke 02/23/2017  . Acute ischemic left middle cerebral artery (MCA) stroke (HCC) 02/22/2017  . Paroxysmal A-fib (HCC) 02/17/2017  . Atrial fibrillation with RVR (HCC) 02/17/2017  . HLD (hyperlipidemia) 02/17/2017  . Acute hypoxemic respiratory failure (HCC)   . Stroke (cerebrum) (HCC) - Acute MCA infarct due to left CCA/ICA and MCA occlusion, s/p mechanical thrombectomy and left ICA stenting in the setting of newly diagnosed afib 02/14/2017  . Atherosclerotic cardiovascular disease 11/07/2013    Zena Amos, PT 05/05/2018, 7:19 PM  [facility] [address] [place], [place], [address] Phone: [phone]   Fax:  [phone]  Name: Kaitlin [name] MRN: [id number] Date of Birth: 09-Jun-1946

## 2018-05-05 NOTE — Patient Instructions (Signed)
Access Code: [email or web address]  URL: [email or web address]  Date: 05/05/2018  Prepared by: Veda Canning   Exercises  Sidelying Bent Knee Hip Flexion Stretch - 1 reps - 1 sets - 60 seonds hold - 1x daily - 7x weekly

## 2018-05-08 ENCOUNTER — Telehealth: Payer: Self-pay | Admitting: Pharmacist

## 2018-05-08 ENCOUNTER — Encounter: Payer: Self-pay | Admitting: Physical Therapy

## 2018-05-08 ENCOUNTER — Ambulatory Visit: Payer: Medicare HMO | Admitting: Physical Therapy

## 2018-05-08 DIAGNOSIS — M79652 Pain in left thigh: Secondary | ICD-10-CM | POA: Diagnosis not present

## 2018-05-08 DIAGNOSIS — M6281 Muscle weakness (generalized): Secondary | ICD-10-CM

## 2018-05-08 DIAGNOSIS — R2681 Unsteadiness on feet: Secondary | ICD-10-CM

## 2018-05-08 DIAGNOSIS — R2689 Other abnormalities of gait and mobility: Secondary | ICD-10-CM

## 2018-05-08 NOTE — Telephone Encounter (Signed)
[employer] copay $118 per month. LMOM for pt to see if this is affordable, she will not qualify for patient assistance.

## 2018-05-08 NOTE — Telephone Encounter (Signed)
Copay affordable for pt. Her husband will help with injections and is aware to call clinic once she begins injections so that we can schedule f/u lab work after 3 injections.

## 2018-05-08 NOTE — Therapy (Signed)
[facility] [address] [place], [place], [address] Phone: [phone]   Fax:  [phone]  Physical Therapy Treatment  Patient Details  Name: Kaitlin [name] MRN: [id number] Date of Birth: 07/08/1946 Referring Provider (PT): Dennie Bible, NP   Encounter Date: 05/08/2018  PT End of Session - 05/08/18 0937    Visit Number  24    Number of Visits  25    Date for PT Re-Evaluation  05/12/18    Authorization Type  [employer]na Medicare    Authorization Time Period  12/27/17 to 12/27/17; 02/13/18 to 04/14/18; 04/12/18 to 07/11/2018    PT Start Time  0933    PT Stop Time  1015    PT Time Calculation (min)  42 min    Equipment Utilized During Treatment  Gait belt    Activity Tolerance  Patient tolerated treatment well    Behavior During Therapy  [facility] for tasks assessed/performed       Past Medical History:  Diagnosis Date  . Abnormality of gait as late effect of stroke 02/23/2017  . Acute hypoxemic respiratory failure ([place])   . Atrial fibrillation with RVR ([place]) 02/17/2017  . CAD S/P percutaneous coronary angioplasty 2001  . CVA (cerebral vascular accident) ([place]) 01/2017  . Dysphagia as late effect of stroke 02/23/2017  . HCVD (hypertensive cardiovascular disease) 01/2017   normal LVF with moderate LVH, grade 1 DD  . High cholesterol   . Hypokalemia   . Labile blood pressure   . Leukocytosis   . Multiple sclerosis ([place])   . PAF (paroxysmal atrial fibrillation) ([place]) 01/2017  . Right hemiparesis [facility])     Past Surgical History:  Procedure Laterality Date  . CORONARY ANGIOPLASTY WITH STENT PLACEMENT  11/1999   RCA DES[facility]  . IR ANGIO INTRA EXTRACRAN SEL COM CAROTID INNOMINATE UNI R MOD SED  02/15/2017  . IR ANGIO VERTEBRAL SEL SUBCLAVIAN INNOMINATE UNI R MOD SED  02/15/2017  . IR INTRAVSC STENT CERV CAROTID W/O EMB-PROT MOD SED INC ANGIO  02/15/2017  . IR PERCUTANEOUS ART THROMBECTOMY/INFUSION INTRACRANIAL INC DIAG  ANGIO  02/15/2017  . IR RADIOLOGIST EVAL & MGMT  05/10/2017  . NECK SURGERY    . RADIOLOGY WITH ANESTHESIA N/A 02/14/2017   Procedure: RADIOLOGY WITH ANESTHESIA;  Surgeon: Radiologist, Medication, MD;  Location: [facility];  Service: Radiology;  Laterality: N/A;  . WRIST SURGERY      There were no vitals filed for this visit.  Subjective Assessment - 05/08/18 0934    Subjective  No new complaints. No falls to report. Still looking for the foam to use.     Patient is accompained by:  Family member    Pertinent History  afib, CAD s/p PTCA, MS with gait disorder/memory loss, lt cca and ICA CVA 7/18  Extensive white matter disease; fall 06/2017 with SAH     Limitations  Walking;Standing    How long can you sit comfortably?  unlimited    How long can you stand comfortably?  <1 minute    How long can you walk comfortably?  none    Diagnostic tests  MRI 12/08/17 with no acute changes    Patient Stated Goals  pt unable to state; husband wants pt to be able to walk again without a device    Currently in Pain?  Yes    Pain Score  2     Pain Location  Hip    Pain Orientation  Left  Pain Descriptors / Indicators  Aching;Sore    Pain Type  Chronic pain;Intractable pain    Pain Onset  More than a month ago    Pain Frequency  Intermittent    Aggravating Factors   certain activities    Pain Relieving Factors  rest             [facility] Adult PT Treatment/Exercise - 05/08/18 0938      Ambulation/Gait   Ambulation/Gait  Yes    Ambulation/Gait Assistance  5: Supervision;4: Min guard    Ambulation Distance (Feet)  230 Feet   x1 RW; 120 x1 no AD, plus around gym with RW    Assistive device  Rolling walker    Gait Pattern  Step-through pattern;Decreased stride length;Antalgic;Decreased step length - right;Decreased weight shift to left;Right foot flat;Poor foot clearance - right;Right flexed knee in stance    Ambulation Surface  Level;Indoor    Gait velocity  20.84 sec's with RW= 1.57 ft/sec; 13.75  sec's= 2.39 ft/sec no AD with min guard assist      Timed Up and Go Test   TUG  Normal TUG    Normal TUG (seconds)  14.16   with RW; 12.87 no AD with min guard assist     Knee/Hip Exercises: Aerobic   Nustep  UE/LE's on level 3.0 x 5 mintues with >/= 40 steps per minute.           Balance Exercises - 05/08/18 0958      Balance Exercises: Standing   Rockerboard  Anterior/posterior;Lateral;Head turns;EO;EC;30 seconds;10 reps      Balance Exercises: Standing   Rebounder Limitations  performed both ways on balance board with no UE support to intermittent touch to bars at times: with EO rocking the board with emphasis on tall posture, progressed to EC rocking board; then holding the board steady with EC no head movements, progressing to EC head movements left<>right, then up<>down. min guard to min assist for balance with cues on posture and weight shifting needed.                                PT Short Term Goals - 04/12/18 1904      PT SHORT TERM GOAL #1   Title  See LTGs        PT Long Term Goals - 05/08/18 4132      PT LONG TERM GOAL #1   Title  Patient (with supervision of husband) will perform updated HEP, to include standing strengthening and balance exercises (Target for all LTGs 05/12/2018)    Time  4    Period  Weeks    Status  On-going      PT LONG TERM GOAL #2   Title  Patient will improve gait velocity to >=2.5 ft/sec to show progress towards safe community ambulation.     Baseline  05/08/18: 2.39 ft/sec no AD, improved from 2.22 ft/sec just not to goal    Time  --    Period  --    Status  Partially Met      PT LONG TERM GOAL #3   Title  Patient will decr TUG with no device to <=13.5 seconds to reflect decr fall risk.     Baseline  05/08/18: 12.87 sec no AD    Time  --    Period  --    Status  Achieved  Plan - 05/08/18 0937    Clinical Impression Statement  Today's skilled session initially focused on progress toward LTGs with 1 of 2  goals checked met, other goal partially met. Will focus on finalizing her HEP at her final session on Friday. Remainder of today's session continued to focus on hip/ankle strategies with no issues reported.     Rehab Potential  Good    Clinical Impairments Affecting Rehab Potential  lt thigh pain and decr memory (however husband attends all sessions and assists her with her home program)    PT Frequency  2x / week    PT Duration  4 weeks    PT Treatment/Interventions  ADLs/Self Care Home Management;Gait training;DME Instruction;Stair training;Functional mobility training;Therapeutic activities;Therapeutic exercise;Balance training;Neuromuscular re-education;Cognitive remediation;Orthotic Fit/Training;Patient/family education;Passive range of motion;Manual techniques;Dry needling;Moist Heat;Ultrasound;Taping;Aquatic Therapy   Plan updated to further address left thigh pain; recertification sent to reflect changes   PT Next Visit Plan  assess remaining LTG and discharge per PT POC    Consulted and Agree with Plan of Care  Patient;Family member/caregiver    Family Member Consulted  [name], husband       Patient will benefit from skilled therapeutic intervention in order to improve the following deficits and impairments:  Decreased activity tolerance, Decreased balance, Decreased cognition, Decreased mobility, Decreased knowledge of use of DME, Decreased endurance, Decreased range of motion, Decreased strength, Difficulty walking, Impaired flexibility, Postural dysfunction, Pain, Increased muscle spasms, Increased fascial restricitons(TBA if pt cleared for PT)  Visit Diagnosis: Other abnormalities of gait and mobility  Pain in left thigh  Muscle weakness (generalized)  Unsteadiness on feet     Problem List Patient Active Problem List   Diagnosis Date Noted  . Elevated liver function tests 05/02/2018  . Elevated alanine aminotransferase (ALT) level 05/02/2018  . Elevated AST (SGOT) 05/02/2018   . History of stroke 11/30/2017  . Cognitive deficit, post-stroke 09/30/2017  . Subarachnoid hemorrhage ([place]) 06/04/2017  . Multiple sclerosis ([place]) 05/12/2017  . CAD (coronary artery disease) 04/12/2017  . Hypertension 04/11/2017  . Right hemiparesis ([place])   . Aspiration pneumonia of right lung ([place])   . Leukocytosis   . Hypokalemia   . Labile blood pressure   . Abnormality of gait as late effect of stroke 02/23/2017  . Dysarthria as late effect of stroke 02/23/2017  . Dysphagia as late effect of stroke 02/23/2017  . Acute ischemic left middle cerebral artery (MCA) stroke ([place]) 02/22/2017  . Paroxysmal A-fib ([place]) 02/17/2017  . Atrial fibrillation with RVR ([place]) 02/17/2017  . HLD (hyperlipidemia) 02/17/2017  . Acute hypoxemic respiratory failure ([place])   . Stroke (cerebrum) ([facility]) - Acute MCA infarct due to left CCA/ICA and MCA occlusion, s/p mechanical thrombectomy and left ICA stenting in the setting of newly diagnosed afib 02/14/2017  . Atherosclerotic cardiovascular disease 11/07/2013   Willow Ora, PTA, [facility] [address] [place], [place] [address] [phone] 05/08/18, 7:54 PM   Name: Kaitlin [name] MRN: [id number] Date of Birth: [birth year]

## 2018-05-12 ENCOUNTER — Ambulatory Visit: Payer: Medicare HMO | Admitting: Physical Therapy

## 2018-05-12 ENCOUNTER — Encounter: Payer: Self-pay | Admitting: Physical Therapy

## 2018-05-12 DIAGNOSIS — R69 Illness, unspecified: Secondary | ICD-10-CM | POA: Diagnosis not present

## 2018-05-12 DIAGNOSIS — R2681 Unsteadiness on feet: Secondary | ICD-10-CM | POA: Diagnosis not present

## 2018-05-12 DIAGNOSIS — M6281 Muscle weakness (generalized): Secondary | ICD-10-CM

## 2018-05-12 DIAGNOSIS — M79652 Pain in left thigh: Secondary | ICD-10-CM | POA: Diagnosis not present

## 2018-05-12 DIAGNOSIS — R2689 Other abnormalities of gait and mobility: Secondary | ICD-10-CM

## 2018-05-12 NOTE — Therapy (Signed)
[facility] [address] [place], [place], [address] Phone: [phone]   Fax:  [phone]  Physical Therapy Treatment and Discharge Summary  Patient Details  Name: Kaitlin [name] MRN: [id number] Date of Birth: 11/13/1945 Referring Provider (PT): Dennie Bible, NP   Encounter Date: 05/12/2018  PT End of Session - 05/12/18 1654    Visit Number  25    Number of Visits  25    Date for PT Re-Evaluation  05/12/18    Authorization Type  Aetna Medicare    Authorization Time Period  12/27/17 to 12/27/17; 02/13/18 to 04/14/18; 04/12/18 to 07/11/2018    PT Start Time  0800    PT Stop Time  0845    PT Time Calculation (min)  45 min    Activity Tolerance  Patient tolerated treatment well    Behavior During Therapy  [facility] for tasks assessed/performed       Past Medical History:  Diagnosis Date  . Abnormality of gait as late effect of stroke 02/23/2017  . Acute hypoxemic respiratory failure ([place])   . Atrial fibrillation with RVR ([place]) 02/17/2017  . CAD S/P percutaneous coronary angioplasty 2001  . CVA (cerebral vascular accident) ([place]) 01/2017  . Dysphagia as late effect of stroke 02/23/2017  . HCVD (hypertensive cardiovascular disease) 01/2017   normal LVF with moderate LVH, grade 1 DD  . High cholesterol   . Hypokalemia   . Labile blood pressure   . Leukocytosis   . Multiple sclerosis ([place])   . PAF (paroxysmal atrial fibrillation) ([place]) 01/2017  . Right hemiparesis [facility])     Past Surgical History:  Procedure Laterality Date  . CORONARY ANGIOPLASTY WITH STENT PLACEMENT  11/1999   [facility]  . IR ANGIO INTRA EXTRACRAN SEL COM CAROTID INNOMINATE UNI R MOD SED  02/15/2017  . IR ANGIO VERTEBRAL SEL SUBCLAVIAN INNOMINATE UNI R MOD SED  02/15/2017  . IR INTRAVSC STENT CERV CAROTID W/O EMB-PROT MOD SED INC ANGIO  02/15/2017  . IR PERCUTANEOUS ART THROMBECTOMY/INFUSION INTRACRANIAL INC DIAG ANGIO  02/15/2017  . IR  RADIOLOGIST EVAL & MGMT  05/10/2017  . NECK SURGERY    . RADIOLOGY WITH ANESTHESIA N/A 02/14/2017   Procedure: RADIOLOGY WITH ANESTHESIA;  Surgeon: Radiologist, Medication, MD;  Location: [facility];  Service: Radiology;  Laterality: N/A;  . WRIST SURGERY      There were no vitals filed for this visit.  Subjective Assessment - 05/12/18 0804    Subjective  Hip is a [name] sore today. Husband requested to go thru HEP to be sure they know what ex's to do (as we have d/c'd many as they became to easy)    Patient is accompained by:  Family member    Pertinent History  afib, CAD s/p PTCA, MS with gait disorder/memory loss, lt cca and ICA CVA 7/18  Extensive white matter disease; fall 06/2017 with SAH     Limitations  Walking;Standing    How long can you sit comfortably?  unlimited    How long can you stand comfortably?  <1 minute    How long can you walk comfortably?  none    Diagnostic tests  MRI 12/08/17 with no acute changes    Patient Stated Goals  pt unable to state; husband wants pt to be able to walk again without a device    Currently in Pain?  Yes    Pain Score  2     Pain Location  Hip  Pain Orientation  Left    Pain Descriptors / Indicators  Aching;Sore    Pain Radiating Towards  lateral thigh    Pain Onset  More than a month ago    Pain Frequency  Intermittent    Aggravating Factors   unsure; certain activities    Pain Relieving Factors  rest    Effect of Pain on Daily Activities  limits her walking                       [facility] Adult PT Treatment/Exercise - 05/12/18 0812      Lumbar Exercises: Stretches   Piriformis Stretch  Left;1 rep;30 seconds;Right      Knee/Hip Exercises: Aerobic   Nustep  UE/LE's on level 1.0 x 9 mintues with >/= 40 steps per minute.       Knee/Hip Exercises: Standing   Hip Flexion  Stengthening;Both;1 set;10 reps    Hip Abduction  AROM;Stengthening;Both;1 set;10 reps      Knee/Hip Exercises: Sidelying   Clams  lifting LLE, x 10 reps              PT Education - 05/12/18 1653    Education Details  finalized HEP; most important to continue cycling and stretches at least 5 days/week; strengthening, balance could be 3x/week    Person(s) Educated  Patient;Spouse    Methods  Explanation;Handout    Comprehension  Verbalized understanding       PT Short Term Goals - 04/12/18 1904      PT SHORT TERM GOAL #1   Title  See LTGs        PT Long Term Goals - 05/12/18 1655      PT LONG TERM GOAL #1   Title  Patient (with supervision of husband) will perform updated HEP, to include standing strengthening and balance exercises (Target for all LTGs 05/12/2018)    Baseline  05/12/18 met    Time  4    Period  Weeks    Status  Achieved      PT LONG TERM GOAL #2   Title  Patient will improve gait velocity to >=2.5 ft/sec to show progress towards safe community ambulation.     Baseline  05/08/18: 2.39 ft/sec no AD, improved from 2.22 ft/sec just not to goal    Status  Partially Met      PT LONG TERM GOAL #3   Title  Patient will decr TUG with no device to <=13.5 seconds to reflect decr fall risk.     Baseline  05/08/18: 12.87 sec no AD    Status  Achieved            Plan - 05/12/18 1656    Clinical Impression Statement  Completed checking LTG#1 with overall pt meeting 2 of 3 goals and 1 goal partially met (progress made, just not to goal level). Husband has been very involved and assisting patient with HEP throughout course of PT due to her impaired cognition. Anticipate she will do well with continued use of HEP. Patient/husband agree with discharge from PT.     Rehab Potential  Good    Clinical Impairments Affecting Rehab Potential  lt thigh pain and decr memory (however husband attends all sessions and assists her with her home program)    PT Frequency  2x / week    PT Duration  4 weeks    PT Treatment/Interventions  ADLs/Self Care Home Management;Gait training;DME Instruction;Stair training;Functional mobility  training;Therapeutic activities;Therapeutic exercise;Balance  training;Neuromuscular re-education;Cognitive remediation;Orthotic Fit/Training;Patient/family education;Passive range of motion;Manual techniques;Dry needling;Moist Heat;Ultrasound;Taping;Aquatic Therapy   Plan updated to further address left thigh pain; recertification sent to reflect changes   Consulted and Agree with Plan of Care  Patient;Family member/caregiver    Family Member Consulted  [name], husband       Patient will benefit from skilled therapeutic intervention in order to improve the following deficits and impairments:  Decreased activity tolerance, Decreased balance, Decreased cognition, Decreased mobility, Decreased knowledge of use of DME, Decreased endurance, Decreased range of motion, Decreased strength, Difficulty walking, Impaired flexibility, Postural dysfunction, Pain, Increased muscle spasms, Increased fascial restricitons(TBA if pt cleared for PT)  Visit Diagnosis: Other abnormalities of gait and mobility  Pain in left thigh  Muscle weakness (generalized)     Problem List Patient Active Problem List   Diagnosis Date Noted  . Elevated liver function tests 05/02/2018  . Elevated alanine aminotransferase (ALT) level 05/02/2018  . Elevated AST (SGOT) 05/02/2018  . History of stroke 11/30/2017  . Cognitive deficit, post-stroke 09/30/2017  . Subarachnoid hemorrhage ([place]) 06/04/2017  . Multiple sclerosis ([place]) 05/12/2017  . CAD (coronary artery disease) 04/12/2017  . Hypertension 04/11/2017  . Right hemiparesis ([place])   . Aspiration pneumonia of right lung ([place])   . Leukocytosis   . Hypokalemia   . Labile blood pressure   . Abnormality of gait as late effect of stroke 02/23/2017  . Dysarthria as late effect of stroke 02/23/2017  . Dysphagia as late effect of stroke 02/23/2017  . Acute ischemic left middle cerebral artery (MCA) stroke ([place]) 02/22/2017  . Paroxysmal A-fib ([place]) 02/17/2017  . Atrial  fibrillation with RVR ([place]) 02/17/2017  . HLD (hyperlipidemia) 02/17/2017  . Acute hypoxemic respiratory failure ([place])   . Stroke (cerebrum) ([facility]) - Acute MCA infarct due to left CCA/ICA and MCA occlusion, s/p mechanical thrombectomy and left ICA stenting in the setting of newly diagnosed afib 02/14/2017  . Atherosclerotic cardiovascular disease 11/07/2013    Rexanne Mano, PT 05/12/2018, 4:59 PM  [facility] [address] [place], [place], [address] Phone: [phone]   Fax:  [phone]  Name: Kaitlin [name] MRN: [id number] Date of Birth: 08-Aug-1945

## 2018-05-12 NOTE — Patient Instructions (Addendum)
  Prior to this exercise and stretch, use your heating pad over the outside of your left thigh for up to 5 minutes.  Clam Shell 45 Degrees    Lying on the sofa on your right side with your back up against the back of the sofa. Bend your hips and knees with one pillow between knees and ankles. Keeping your feet together, lift the left knee toward the ceiling. Be sure pelvis does not roll backward. Do __10_ times, rest, and do 10 more__ times. Do this twice per day.           Stand facing your counter with both hands on the counter.  March in place lifting your knee as high as you can. Repeat __10__ times on each leg. Do __1__ sessions per day.       Stand facing counter with both hands resting as lightly as needed. Raise one leg out to the side and slowly lower. Then lift the other leg out to the side and slowly lower.  _10__ reps per leg, __1_ sets per day       TANDEM STANCE WITH SUPPORT  Stand in front of counter top for support. Then place the heel of one foot so that it is touching the toes of the other foot. Maintain your balance in this position. Hold for 15 seconds.  Perform 2 reps with each foot in front, 1-2 times a day, 3-5 days a week.     Use the counter top for balance support: march forward slowly along counter top. At the end, turn around and march fwd to the starting place. Repeat down and back again.  4 laps total. 1-2 times a day, 3-5 days a week.   HIP / KNEE: Extension - Sit to Stand    Sitting, cross arms across your chest, lean chest forward, raise hips up from surface. Straighten hips and knees. Weight bear equally on left and right sides. Backs of legs should not push off surface. Then SLOWLY lower back down to sitting. Your shoulders should stay forward as you bend your hips and knees.  _10__ reps per set, __2_ sets per day     Perform these in a corner with a chair in front of you for safety:   Feet Together (Compliant  Surface) Varied Arm Positions - Eyes Closed    Stand on compliant surface: _pillow/s_ with feet together and arms at sides. Close eyes and visualize upright position. Hold__30__ seconds. Repeat __2-3__ times per session. Do __1_ sessions per day, 3-5 days a week.  Copyright  VHI. All rights reserved.    Feet Apart (Compliant Surface) Head Motion - Eyes Closed    Stand on compliant surface: _pillow/s_ with feet shoulder width apart. Close eyes and move head slowly up and down, then left and right.   Repeat __10__ times each way per session. Do __1__ sessions per day, 3-5 days a week.

## 2018-05-18 ENCOUNTER — Encounter: Payer: Self-pay | Admitting: Cardiology

## 2018-05-18 ENCOUNTER — Ambulatory Visit: Payer: Medicare HMO | Admitting: Cardiology

## 2018-05-18 ENCOUNTER — Encounter: Payer: Self-pay | Admitting: *Deleted

## 2018-05-18 VITALS — BP 150/64 | HR 63 | Ht 62.0 in | Wt 152.0 lb

## 2018-05-18 DIAGNOSIS — I69351 Hemiplegia and hemiparesis following cerebral infarction affecting right dominant side: Secondary | ICD-10-CM | POA: Diagnosis not present

## 2018-05-18 DIAGNOSIS — I639 Cerebral infarction, unspecified: Secondary | ICD-10-CM | POA: Diagnosis not present

## 2018-05-18 DIAGNOSIS — G35 Multiple sclerosis: Secondary | ICD-10-CM | POA: Diagnosis not present

## 2018-05-18 DIAGNOSIS — R600 Localized edema: Secondary | ICD-10-CM

## 2018-05-18 DIAGNOSIS — Z789 Other specified health status: Secondary | ICD-10-CM | POA: Diagnosis not present

## 2018-05-18 DIAGNOSIS — E782 Mixed hyperlipidemia: Secondary | ICD-10-CM | POA: Diagnosis not present

## 2018-05-18 DIAGNOSIS — R0609 Other forms of dyspnea: Secondary | ICD-10-CM | POA: Diagnosis not present

## 2018-05-18 DIAGNOSIS — R2689 Other abnormalities of gait and mobility: Secondary | ICD-10-CM | POA: Diagnosis not present

## 2018-05-18 DIAGNOSIS — I2510 Atherosclerotic heart disease of native coronary artery without angina pectoris: Secondary | ICD-10-CM

## 2018-05-18 DIAGNOSIS — I10 Essential (primary) hypertension: Secondary | ICD-10-CM | POA: Diagnosis not present

## 2018-05-18 DIAGNOSIS — R0600 Dyspnea, unspecified: Secondary | ICD-10-CM

## 2018-05-18 MED ORDER — HYDROCHLOROTHIAZIDE 25 MG PO TABS: 25.0000 mg | ORAL_TABLET | Freq: Every day | ORAL | 2 refills | Status: DC

## 2018-05-18 MED ORDER — LISINOPRIL 2.5 MG PO TABS: 2.5000 mg | ORAL_TABLET | Freq: Every day | ORAL | 1 refills | Status: DC

## 2018-05-18 NOTE — Patient Instructions (Addendum)
Medication Instructions:   START TAKING LISINOPRIL 2.5 MG ONCE DAILY  START TAKING HYDROCHLOROTHIAZIDE 25 MG ONCE DAILY   If you need a refill on your cardiac medications before your next appointment, please call your pharmacy.     Lab work:  IN 6 WEEKS TO CHECK--CMET AND LIPIDS--PLEASE COME FASTING TO THIS LAB APPOINTMENT  If you have labs (blood work) drawn today and your tests are completely normal, you will receive your results only by: Marland Kitchen MyChart Message (if you have MyChart) OR . A paper copy in the mail If you have any lab test that is abnormal or we need to change your treatment, we will call you to review the results.    Testing/Procedures:  Your physician has requested that you have a lexiscan myoview. For further information please visit [email or web address] Please follow instruction sheet, as given.  DO IMAGE ON D-SPECT    Follow-Up:  2 MONTHS WITH AN EXTENDER ON DR. Lindaann Slough TEAM

## 2018-05-18 NOTE — Progress Notes (Signed)
Cardiology Office Note    Date:  05/21/2018   ID:  Kaitlin [name], DOB 20-May-1946, MRN [id number]  PCP:  Chilton Greathouse, MD  Cardiologist: Dr. Delton See  Reason for visit: 6 moths follow up  History of Present Illness:  Kaitlin [name] is a 72 y.o. female a PMH significant for CAD, s/p remote RCA PCI 2001, HTN, HLD, and stable multiple sclerosis. She presented to [facility] 02/15/17 with a L MCA occlusion s/p LMCA PTA and LICA PTA/stenting. While on telemetry she had documented PAF and anticoagulation was initiated. She remained on Plavix and Xarelto.CHADSVASC=5. She was maintaining normal sinus rhythm on amiodarone and beta blocker. 2-D echo LVEF 65-70% with mild AS, no cardiac source of emboli. Patient was transferred to cardiac rehabilitation. Patient was discharged from inpatient rehabilitation 03/19/17.   06/17/2017 -  this 2 months follow-up, she was seen by Herma Carson, she's been complaining off episodes of feeling short of breath when she stands up or when she goes to the bathroom at night. However when she goes to cardiac rehabilitation and walks around for longer time she feels perfectly fine and has no shortness of breath chest pressure or tightness. 2 weeks ago she had a mechanical fall when she tripped over her walker and hit her head. She was hospitalized with finding of a right anterior frontal subarachnoid and parenchymal hemorrhage. The patient was continued on post Xarelto and Plavix, she supposed to follow with neurology in 2 weeks and if stable findings of her carotid stent her neurologist will consider discontinuation of Plavix. She denies any palpitations dizziness presyncope or syncope.  09/22/2017 - she comes accompanied by her husband, they enjoy walks and some light exercises, they used to be avid hikers, and hoping to start this summer again.  She denies any chest pain DOE, LE edema, palpitations or syncope, she has been compliant with her meds and had no side  effects.  05/18/2018 - 6 months follow up, denies chest pain or SOB, but has noticed elevated blood pressures and mild LE edema. No bleeding with Xarelto. Tolerates Repatha well.   Past Medical History:  Diagnosis Date  . Abnormality of gait as late effect of stroke 02/23/2017  . Acute hypoxemic respiratory failure (HCC)   . Atrial fibrillation with RVR (HCC) 02/17/2017  . CAD S/P percutaneous coronary angioplasty 2001  . CVA (cerebral vascular accident) (HCC) 01/2017  . Dysphagia as late effect of stroke 02/23/2017  . HCVD (hypertensive cardiovascular disease) 01/2017   normal LVF with moderate LVH, grade 1 DD  . High cholesterol   . Hypokalemia   . Labile blood pressure   . Leukocytosis   . Multiple sclerosis (HCC)   . PAF (paroxysmal atrial fibrillation) (HCC) 01/2017  . Right hemiparesis [facility])     Past Surgical History:  Procedure Laterality Date  . CORONARY ANGIOPLASTY WITH STENT PLACEMENT  11/1999   RCA DES-[facility]  . IR ANGIO INTRA EXTRACRAN SEL COM CAROTID INNOMINATE UNI R MOD SED  02/15/2017  . IR ANGIO VERTEBRAL SEL SUBCLAVIAN INNOMINATE UNI R MOD SED  02/15/2017  . IR INTRAVSC STENT CERV CAROTID W/O EMB-PROT MOD SED INC ANGIO  02/15/2017  . IR PERCUTANEOUS ART THROMBECTOMY/INFUSION INTRACRANIAL INC DIAG ANGIO  02/15/2017  . IR RADIOLOGIST EVAL & MGMT  05/10/2017  . NECK SURGERY    . RADIOLOGY WITH ANESTHESIA N/A 02/14/2017   Procedure: RADIOLOGY WITH ANESTHESIA;  Surgeon: Radiologist, Medication, MD;  Location: [facility] OR;  Service: Radiology;  Laterality: N/A;  .  WRIST SURGERY      Current Medications: Current Meds  Medication Sig  . Calcium Citrate-Vitamin D (CALCIUM CITRATE + D PO) Take 1 tablet by mouth daily.  . cholecalciferol (VITAMIN D) 1000 units tablet Take 3,000 Units by mouth daily.  . clopidogrel (PLAVIX) 75 MG tablet Take 1 tablet (75 mg total) by mouth daily.  . Evolocumab (REPATHA SURECLICK) 140 MG/ML SOAJ Inject 1 pen into the skin every 14 (fourteen) days.   Marland Kitchen FLUoxetine (PROZAC) 10 MG capsule Take 1 capsule (10 mg total) by mouth daily.  . metoprolol tartrate (LOPRESSOR) 50 MG tablet Take 1 tablet (50 mg total) by mouth 2 (two) times daily.  . Multiple Vitamins-Minerals (PRESERVISION AREDS PO) Take 1 tablet by mouth 2 (two) times daily.  . pantoprazole (PROTONIX) 40 MG tablet Take 1 tablet (40 mg total) by mouth daily.  . Rivaroxaban (XARELTO) 15 MG TABS tablet Take 1 tablet (15 mg total) by mouth daily with supper.     Allergies:   Eliquis [apixaban] and Penicillins   Social History   Socioeconomic History  . Marital status: Married    Spouse name: Not on file  . Number of children: Not on file  . Years of education: Not on file  . Highest education level: Not on file  Occupational History  . Not on file  Social Needs  . Financial resource strain: Not on file  . Food insecurity:    Worry: Not on file    Inability: Not on file  . Transportation needs:    Medical: Not on file    Non-medical: Not on file  Tobacco Use  . Smoking status: Never Smoker  . Smokeless tobacco: Never Used  Substance and Sexual Activity  . Alcohol use: Yes    Alcohol/week: 3.0 standard drinks    Types: 3 Glasses of wine per week    Comment: wine  . Drug use: No  . Sexual activity: Not on file  Lifestyle  . Physical activity:    Days per week: Not on file    Minutes per session: Not on file  . Stress: Not on file  Relationships  . Social connections:    Talks on phone: Not on file    Gets together: Not on file    Attends religious service: Not on file    Active member of club or organization: Not on file    Attends meetings of clubs or organizations: Not on file    Relationship status: Not on file  Other Topics Concern  . Not on file  Social History Narrative  . Not on file     Family History:  The patient's family history includes Cancer in her father and mother; Heart disease in her father; Multiple sclerosis in her sister.   ROS:    Please see the history of present illness.    Review of Systems  Constitution: Negative.  HENT: Negative.   Eyes: Negative.   Cardiovascular: Negative.   Respiratory: Negative.   Hematologic/Lymphatic: Negative.   Musculoskeletal: Negative.  Negative for joint pain.  Gastrointestinal: Negative.   Genitourinary: Negative.   Neurological: Positive for difficulty with concentration, disturbances in coordination and loss of balance.  Psychiatric/Behavioral: Positive for memory loss.   All other systems reviewed and are negative.  PHYSICAL EXAM:   VS:  BP (!) 150/64   Pulse 63   Ht 5\' 2"  (1.575 m)   Wt 152 lb (68.9 kg)   SpO2 94%   BMI 27.80 kg/m  Physical Exam  GEN: Well nourished, well developed, in no acute distress  Neck: Bilateral carotid bruits no JVD,or masses Cardiac:RRR; 2/6 systolic murmur at the left sternal border Respiratory:  clear to auscultation bilaterally, normal work of breathing GI: soft, nontender, nondistended, + BS Ext: Small lesion where she bled anterior tibia from bumping it otherwise lower extremities without cyanosis, clubbing, or edema, Good distal pulses bilaterally Neuro:  Alert and Oriented x 3 Psych: euthymic mood, full affect  Wt Readings from Last 3 Encounters:  05/18/18 152 lb (68.9 kg)  11/30/17 140 lb (63.5 kg)  11/11/17 138 lb 8 oz (62.8 kg)      Studies/Labs Reviewed:   EKG:  EKG is  ordered today.  The ekg ordered today demonstrates Normal sinus rhythm with T wave inversion inferior, anterior lateral changed from 2013 but similar to the hospital  Recent Labs: 06/04/2017: BUN 16; Creatinine, Ser 0.71; Hemoglobin 10.4; Platelets 235; Potassium 4.1; Sodium 135 05/01/2018: ALT 102   Lipid Panel    Component Value Date/Time   CHOL 173 02/15/2017 0420   TRIG 145 02/15/2017 0420   HDL 70 02/15/2017 0420   CHOLHDL 2.5 02/15/2017 0420   VLDL 29 02/15/2017 0420   LDLCALC 74 02/15/2017 0420    Additional studies/ records that were  reviewed today include:   Echo 02/16/17- Study Conclusions   - Left ventricle: The cavity size was normal. There was moderate   concentric hypertrophy. Systolic function was vigorous. The   estimated ejection fraction was in the range of 65% to 70%. Wall   motion was normal; there were no regional wall motion   abnormalities. Doppler parameters are consistent with abnormal   left ventricular relaxation (grade 1 diastolic dysfunction).   Doppler parameters are consistent with elevated ventricular   end-diastolic filling pressure. - Aortic valve: Trileaflet; mildly thickened, mildly calcified   leaflets. There was mild stenosis. Mean gradient (S): 11 mm Hg.   Peak gradient (S): 17 mm Hg. Valve area (VTI): 1.45 cm^2. Valve   area (Vmax): 1.73 cm^2. Valve area (Vmean): 1.44 cm^2. - Mitral valve: Calcified annulus. Mildly thickened leaflets .   There was trivial regurgitation. - Left atrium: The atrium was normal in size. - Right ventricle: The cavity size was normal. Wall thickness was   normal. Systolic function was normal. - Right atrium: The atrium was normal in size. - Tricuspid valve: There was trivial regurgitation. - Pulmonary arteries: Systolic pressure was within the normal   range. - Inferior vena cava: The vessel was normal in size. - Pericardium, extracardiac: There was no pericardial effusion.   Impressions:   - No cardiac source of emboli was indentified.     ASSESSMENT:    1. Mixed hyperlipidemia   2. Coronary artery disease involving native coronary artery of native heart without angina pectoris   3. Essential hypertension   4. DOE (dyspnea on exertion)   5. Lower extremity edema   6. Statin intolerance      PLAN:  In order of problems listed above:  LMCA infarct:  S/p LMCA PTA and LICA PTA/stenting on 7/17.  She remains on plavix and xarelto (in the setting of afib).  Her Plavix might be possibly discontinued after her next carotid ultrasound. She has had  2 falls the last summer with subarachnoid hemorrhage but none since then I would continue for now.  PAF on amiodarone and Xarelto maintaining normal sinus rhythm, we'll continue Xarelto. No bleeding.   Hypertension - add lisinopril 2.5 mg  po daily and hydrochlorothiazide 25 mg po daily  Lower extremity edema - add HCTZ 25 mg po daily  Hyperlipidemia on Repatha.  CAD with remote PCI to the RCA - asymptomatic  Medication Adjustments/Labs and Tests Ordered: Current medicines are reviewed at length with the patient today.  Concerns regarding medicines are outlined above.  Medication changes, Labs and Tests ordered today are listed in the Patient Instructions below. Patient Instructions  Medication Instructions:   START TAKING LISINOPRIL 2.5 MG ONCE DAILY  START TAKING HYDROCHLOROTHIAZIDE 25 MG ONCE DAILY   If you need a refill on your cardiac medications before your next appointment, please call your pharmacy.     Lab work:  IN 6 WEEKS TO CHECK--CMET AND LIPIDS--PLEASE COME FASTING TO THIS LAB APPOINTMENT  If you have labs (blood work) drawn today and your tests are completely normal, you will receive your results only by: Marland Kitchen MyChart Message (if you have MyChart) OR . A paper copy in the mail If you have any lab test that is abnormal or we need to change your treatment, we will call you to review the results.    Testing/Procedures:  Your physician has requested that you have a lexiscan myoview. For further information please visit [email or web address] Please follow instruction sheet, as given.  DO IMAGE ON D-SPECT    Follow-Up:  2 MONTHS WITH AN EXTENDER ON DR. Lindaann Slough TEAM        Signed, Tobias Alexander, MD  05/21/2018 10:02 PM    [facility] [address] [place], [place], [place]  [address] Phone: [phone]; Fax: [phone]

## 2018-05-25 DIAGNOSIS — H4720 Unspecified optic atrophy: Secondary | ICD-10-CM | POA: Diagnosis not present

## 2018-05-25 DIAGNOSIS — Z961 Presence of intraocular lens: Secondary | ICD-10-CM | POA: Diagnosis not present

## 2018-05-25 DIAGNOSIS — H353131 Nonexudative age-related macular degeneration, bilateral, early dry stage: Secondary | ICD-10-CM | POA: Diagnosis not present

## 2018-05-25 DIAGNOSIS — H43813 Vitreous degeneration, bilateral: Secondary | ICD-10-CM | POA: Diagnosis not present

## 2018-05-29 ENCOUNTER — Telehealth (HOSPITAL_COMMUNITY): Payer: Self-pay | Admitting: *Deleted

## 2018-05-29 NOTE — Telephone Encounter (Signed)
Patient given detailed instructions per Myocardial Perfusion Study Information Sheet for the test on 05/31/18 at 1015. Patient notified to arrive 15 minutes early and that it is imperative to arrive on time for appointment to keep from having the test rescheduled.  If you need to cancel or reschedule your appointment, please call the office within 24 hours of your appointment. . Patient verbalized understanding.Zamarah Ullmer, Adelene Idler

## 2018-05-31 ENCOUNTER — Ambulatory Visit (HOSPITAL_COMMUNITY): Payer: Medicare HMO | Attending: Cardiovascular Disease

## 2018-05-31 DIAGNOSIS — R600 Localized edema: Secondary | ICD-10-CM | POA: Insufficient documentation

## 2018-05-31 DIAGNOSIS — Z789 Other specified health status: Secondary | ICD-10-CM

## 2018-05-31 DIAGNOSIS — I10 Essential (primary) hypertension: Secondary | ICD-10-CM | POA: Insufficient documentation

## 2018-05-31 DIAGNOSIS — I2510 Atherosclerotic heart disease of native coronary artery without angina pectoris: Secondary | ICD-10-CM | POA: Insufficient documentation

## 2018-05-31 DIAGNOSIS — R0600 Dyspnea, unspecified: Secondary | ICD-10-CM

## 2018-05-31 DIAGNOSIS — E782 Mixed hyperlipidemia: Secondary | ICD-10-CM | POA: Diagnosis not present

## 2018-05-31 DIAGNOSIS — R0609 Other forms of dyspnea: Secondary | ICD-10-CM | POA: Diagnosis not present

## 2018-05-31 LAB — MYOCARDIAL PERFUSION IMAGING
LV dias vol: 45 mL (ref 46–106)
LV sys vol: 14 mL
Peak HR: 70 {beats}/min
Rest HR: 51 {beats}/min
SDS: 0
SRS: 0
SSS: 0
TID: 0.97

## 2018-05-31 MED ORDER — REGADENOSON 0.4 MG/5ML IV SOLN
0.4000 mg | Freq: Once | INTRAVENOUS | Status: AC
  Administered 2018-05-31: 0.4 mg via INTRAVENOUS

## 2018-05-31 MED ORDER — TECHNETIUM TC 99M TETROFOSMIN IV KIT
10.2000 | PACK | Freq: Once | INTRAVENOUS | Status: AC | PRN
  Administered 2018-05-31: 10.2 via INTRAVENOUS
  Filled 2018-05-31: qty 11

## 2018-05-31 MED ORDER — TECHNETIUM TC 99M TETROFOSMIN IV KIT
32.4000 | PACK | Freq: Once | INTRAVENOUS | Status: AC | PRN
  Administered 2018-05-31: 32.4 via INTRAVENOUS
  Filled 2018-05-31: qty 33

## 2018-05-31 NOTE — Addendum Note (Signed)
Addended by: Jacqlyn Krauss on: 05/31/2018 11:38 AM   Modules accepted: Orders

## 2018-06-01 NOTE — Progress Notes (Signed)
[facility]  PATIENT: Kaitlin [name] DOB: March 09, 1946   REASON FOR VISIT: Follow-up for stroke in July 2018, long history of MS HISTORY FROM: Patient and husband    HISTORY OF PRESENT ILLNESS:  32 year caucasian lady seen today for the first office follow-up visit following hospital admission for stroke in July 2018. She is accompanied by husband. History is up 10 from them as well as review of electronic medical records and have personally reviewed imaging films.Kaitlin Watkinsis an 72 y.o.femalewith a 20 year history of MS who presented as a Code Stroke for right sided weakness and garbled speech. Time of symptom onset was 11 AM while working in the yard, at which point she began to feel very hot and could not speak. She took a shower and symptoms seemed almost completely to resolve per husband. Subsequently, she had a nap and then at about 1700 she and her husband had a few glasses of wine, at which point he noticed that she was having some difficulty using her right side and it "was hard for her to get up", the latter symptom noted at about 1900. CT head showed focal hypodensity in left insula and left posterior putamen compatible with infarction with ASPECTS of8. Also noted was hyperdensity of thedistal left M1.CTA neck showed Occlusion of the left common and internal carotid artery from the arch to the distal cavernous segment.CTA head showed patent paraclinoid and terminal segments of left internal carotid artery and minimal flow in proximal left M1 likely retrograde via a tiny left posterior communicating artery. Occlusion of mid and distal left M1 with poor left MCA collateralization. Left A1 occlusion. Bilateral A2 are patent with the left likely perfused via the anterior communicating artery.  LSN:11 AM 02/14/17 tPA Given:No: Out of time window Of note, she has not had an MS exacerbation in several years and was recently taken off of Betaseron. Patient was  admitted to the intensive care unit where blood pressure was tightly controlled. She was extubated and did well. She was seen by physical speech and occupational therapy. She was started on initially aspirin and Brilinta by Dr. Corliss Skains forcarotid stent but subsequently switched to Plavix and eliquis when anticoagulation was started 5 days after her stroke and it was felt to be safe. She developed mild rash on both cheeks which was felt to be related to eliquis hence it was discontinued and she was started on Xarelto instead .She had some mild hematuria but hematocrit remained stable. She had mild expressive language difficulties and mild residual right-sided hemiparesis at the time of discharge. She was transferred to inpatient rehabilitation. She did well in rehabilitation and was discharged home. She is planning to start outpatient physical and occupation therapy soon and has finished home therapies. She is unrelenting and Xarelto and tolerating them well without bleeding episodes but did have significant episode of bruising of her right forearm last week when she accidentally hit her forearm on the edge of the bed. She states her blood pressure is well controlled and today it is 120/66. She is tolerating Lipitor well without muscle aches and pains. She feels she is physically about 80% better though she still has some occasional word finding difficulties and cannot speak Fluently. A fine finger movements and dexterity is also diminished in the right hand. She has a long-standing history of multiple sclerosis and was followed by Dr. Vergia Alcon but has not been on Betaseron for several years. She is asking for referral to Dr.  Sater to follow her for her multiple sclerosis. UPDATE 5/1/2019CM Kaitlin [name], with history of stroke in July 2018 with a history of stroke event July 2018 history of MS in the past.  She has been off of Betaseron for 2 years.  She is currently on Plavix and Xarelto for secondary  stroke prevention with minimal bruising and no bleeding she is on Lipitor without myalgias.  Blood pressure in the office today 153/66.  She continues to have some right-sided weakness from her stroke however her husband reports that her gait has been more unsteady the last week or so.  She has not had any falls she does not use an assistive device.  She no longer drives.  She has had some memory issues since her stroke and is due for neuropsych evaluation later in the month.  Recent labs primary care office Dr. Felipa Eth with elevated liver functions.  These are to be repeated in 2 months.  She returns for reevaluation UPDATE 11/1/2019CM Kaitlin [name] 72 year old female returns for follow-up with history of stroke in 2018 and history of MS in the past.  She has been off of Betaseron for 2-1/2 years.  She is currently on Plavix and Xarelto for secondary stroke prevention with minimal bruising and no bleeding.  She is also taking Repatha from her cardiologist.  Blood pressure in the office today better 155/69.  Husband claims lisinopril has  just been added.  Since last seen I ordered an MRI of the brain due to increased difficulty with ambulation.  The MRI was reviewed with Dr. Pearlean Brownie  and did not show any new or acute changes but she did have expected age-related changes .  She has also had neuropsych evaluation since last seen.  This was done by Dr. Kieth Brightly.  Who felt that her current changes in memory and expressive language appeared to be primarily related to white matter involvement related to her chronic microvascular ischemia as well as her stroke.  She has had an injection to her left which is been beneficial for her as well as receiving physical therapy.  She is now doing her home exercise.  She returns for reevaluation  REVIEW OF SYSTEMS: Full 14 system review of systems performed and notable only for those listed, all others are neg:  Constitutional: neg  Cardiovascular: neg Ear/Nose/Throat: neg  Skin:  neg Eyes: neg Respiratory: neg Gastroitestinal: neg  Hematology/Lymphatic: Easy bruising Endocrine: Intolerance to heat Musculoskeletal: Walking difficulty Allergy/Immunology: neg Neurological: Mild speech difficulty, memory loss, weakness Psychiatric: neg Sleep : neg   ALLERGIES: Allergies  Allergen Reactions  . Eliquis [Apixaban] Rash  . Penicillins Other (See Comments)    From childhood     HOME MEDICATIONS: Outpatient Medications Prior to Visit  Medication Sig Dispense Refill  . Calcium Citrate-Vitamin D (CALCIUM CITRATE + D PO) Take 1 tablet by mouth daily.    . cholecalciferol (VITAMIN D) 1000 units tablet Take 3,000 Units by mouth daily.    . clopidogrel (PLAVIX) 75 MG tablet Take 1 tablet (75 mg total) by mouth daily. 30 tablet 0  . Evolocumab (REPATHA SURECLICK) 140 MG/ML SOAJ Inject 1 pen into the skin every 14 (fourteen) days. 2 pen 11  . FLUoxetine (PROZAC) 10 MG capsule Take 1 capsule (10 mg total) by mouth daily. 30 capsule 0  . hydrochlorothiazide (HYDRODIURIL) 25 MG tablet Take 1 tablet (25 mg total) by mouth daily. 90 tablet 2  . lisinopril (PRINIVIL,ZESTRIL) 2.5 MG tablet Take 1 tablet (2.5 mg total) by  mouth daily. 90 tablet 1  . metoprolol tartrate (LOPRESSOR) 50 MG tablet Take 1 tablet (50 mg total) by mouth 2 (two) times daily. 60 tablet 0  . Multiple Vitamins-Minerals (PRESERVISION AREDS PO) Take 1 tablet by mouth 2 (two) times daily.    . pantoprazole (PROTONIX) 40 MG tablet Take 1 tablet (40 mg total) by mouth daily. 30 tablet 0  . Rivaroxaban (XARELTO) 15 MG TABS tablet Take 1 tablet (15 mg total) by mouth daily with supper. 30 tablet 0   No facility-administered medications prior to visit.     PAST MEDICAL HISTORY: Past Medical History:  Diagnosis Date  . Abnormality of gait as late effect of stroke 02/23/2017  . Acute hypoxemic respiratory failure (HCC)   . Atrial fibrillation with RVR (HCC) 02/17/2017  . CAD S/P percutaneous coronary  angioplasty 2001  . CVA (cerebral vascular accident) (HCC) 01/2017  . Dysphagia as late effect of stroke 02/23/2017  . HCVD (hypertensive cardiovascular disease) 01/2017   normal LVF with moderate LVH, grade 1 DD  . High cholesterol   . Hypokalemia   . Labile blood pressure   . Leukocytosis   . Multiple sclerosis (HCC)   . PAF (paroxysmal atrial fibrillation) (HCC) 01/2017  . Right hemiparesis (HCC)     PAST SURGICAL HISTORY: Past Surgical History:  Procedure Laterality Date  . CORONARY ANGIOPLASTY WITH STENT PLACEMENT  11/1999   RCA DES[facility]  . IR ANGIO INTRA EXTRACRAN SEL COM CAROTID INNOMINATE UNI R MOD SED  02/15/2017  . IR ANGIO VERTEBRAL SEL SUBCLAVIAN INNOMINATE UNI R MOD SED  02/15/2017  . IR INTRAVSC STENT CERV CAROTID W/O EMB-PROT MOD SED INC ANGIO  02/15/2017  . IR PERCUTANEOUS ART THROMBECTOMY/INFUSION INTRACRANIAL INC DIAG ANGIO  02/15/2017  . IR RADIOLOGIST EVAL & MGMT  05/10/2017  . NECK SURGERY    . RADIOLOGY WITH ANESTHESIA N/A 02/14/2017   Procedure: RADIOLOGY WITH ANESTHESIA;  Surgeon: Radiologist, Medication, MD;  Location: [facility] OR;  Service: Radiology;  Laterality: N/A;  . WRIST SURGERY      FAMILY HISTORY: Family History  Problem Relation Age of Onset  . Heart disease Father   . Cancer Father   . Multiple sclerosis Sister   . Cancer Mother     SOCIAL HISTORY: Social History   Socioeconomic History  . Marital status: Married    Spouse name: Not on file  . Number of children: Not on file  . Years of education: Not on file  . Highest education level: Not on file  Occupational History  . Not on file  Social Needs  . Financial resource strain: Not on file  . Food insecurity:    Worry: Not on file    Inability: Not on file  . Transportation needs:    Medical: Not on file    Non-medical: Not on file  Tobacco Use  . Smoking status: Never Smoker  . Smokeless tobacco: Never Used  Substance and Sexual Activity  . Alcohol use: Yes    Alcohol/week:  3.0 standard drinks    Types: 3 Glasses of wine per week    Comment: wine  . Drug use: No  . Sexual activity: Not on file  Lifestyle  . Physical activity:    Days per week: Not on file    Minutes per session: Not on file  . Stress: Not on file  Relationships  . Social connections:    Talks on phone: Not on file    Gets together: Not on file  Attends religious service: Not on file    Active member of club or organization: Not on file    Attends meetings of clubs or organizations: Not on file    Relationship status: Not on file  . Intimate partner violence:    Fear of current or ex partner: Not on file    Emotionally abused: Not on file    Physically abused: Not on file    Forced sexual activity: Not on file  Other Topics Concern  . Not on file  Social History Narrative  . Not on file     PHYSICAL EXAM  Vitals:   06/02/18 1042  BP: (!) 155/69  Pulse: (!) 54  Weight: 153 lb (69.4 kg)  Height: 5\' 2"  (1.575 m)   Body mass index is 27.98 kg/m.  Generalized: Well developed, in no acute distress  Head: normocephalic and atraumatic,. Oropharynx benign  Neck: Supple, no carotid bruits  Cardiac: Regular rate rhythm, no murmur  Musculoskeletal: No deformity   Neurological examination   Mentation: Alert oriented x 3  Attention span and concentration appropriate.  Speech shows mild word finding difficulties  Follows all commands speech and language fluent.   Cranial nerve II-XII: .Pupils were equal round reactive to light extraocular movements were full, visual field were full on confrontational test. Facial sensation and strength were normal. hearing was intact to finger rubbing bilaterally. Uvula tongue midline. head turning and shoulder shrug were normal and symmetric.Tongue protrusion into cheek strength was normal. Motor: Muscle bulk is normal, tone  mildly increased on the right strength is 4 to 4+ out of 5 in the legs right worse than the left .  Rapid alternating  movements are slow bilaterally Sensory: normal and symmetric to light touch, pinprick, and  Vibration, in the upper and lower extremity Coordination: finger-nose-finger, heel-to-shin bilaterally, performed poorly on the right Reflexes: 1+ upper lower and symmetric plantar responses were flexor bilaterally. Gait and Station: Rising up from seated position with push off, ambulates a short distance and has a limp on the right.  Unable to heel toe or tandem.  Ambulates with a rolling walker   DIAGNOSTIC DATA (LABS, IMAGING, TESTING) - I reviewed patient records, labs, notes, testing and imaging myself where available.  Lab Results  Component Value Date   WBC 7.4 06/04/2017   HGB 10.4 (L) 06/04/2017   HCT 32.2 (L) 06/04/2017   MCV 92.8 06/04/2017   PLT 235 06/04/2017      Component Value Date/Time   NA 135 06/04/2017 0407   K 4.1 06/04/2017 0407   CL 102 06/04/2017 0407   CO2 24 06/04/2017 0407   GLUCOSE 113 (H) 06/04/2017 0407   BUN 16 06/04/2017 0407   CREATININE 0.71 06/04/2017 0407   CALCIUM 8.7 (L) 06/04/2017 0407   PROT 6.7 05/01/2018 1132   ALBUMIN 4.3 05/01/2018 1132   AST 94 (H) 05/01/2018 1132   ALT 102 (H) 05/01/2018 1132   ALKPHOS 74 05/01/2018 1132   BILITOT 0.3 05/01/2018 1132   GFRNONAA >60 06/04/2017 0407   GFRAA >60 06/04/2017 0407   Lab Results  Component Value Date   CHOL 173 02/15/2017   HDL 70 02/15/2017   LDLCALC 74 02/15/2017   TRIG 145 02/15/2017   CHOLHDL 2.5 02/15/2017   Lab Results  Component Value Date   HGBA1C 5.2 02/15/2017    ASSESSMENT AND PLAN    72 year old Caucasian lady with embolic left middle cerebral artery infarct in July 2018 secondary to new onset atrial  fibrillation status post left ICA angioplasty stenting and left middle cerebral artery mechanical embolectomy with excellent clinical recovery. Remote history of possible multiple sclerosis which has been quite quiecent despite being off medication for 2.5 years.   . 11/30/17  Abnormal MRI scan of the brain showing remote age left basal ganglia infarct with encephalomalacia and moderate changes of chronic microvascular ischemia and generalized cerebral atrophy.  Compared to CT head dated 06/04/2017 expected evolutionary changes are noted.The patient is a current patient of Dr. Pearlean Brownie  who is out of the office today.This note is sent to the work in doctor.      PLAN:Stressed the importance of management of risk factors to prevent further stroke Continue Xarelto and Plavix for secondary stroke prevention Maintain strict control of hypertension with blood pressure goal below 130/90,  continue antihypertensive medications Control of diabetes with hemoglobin A1c below 6.5 followed by primary care  Cholesterol with LDL cholesterol less than 70, followed by primary care,   continue Repatha Continue HEP gait abnormality use walker for safe ambulation  eat healthy diet with whole grains,  fresh fruits and vegetables Follow-up with primary care for stroke risk factor modification, maintain blood pressure goal less than 130 systolic, diabetes with A1c below 7, lipids with LDL below 70  F/U here in 8 months Nilda Riggs, [facility], [facility], APRN  [facility] [address], [address] [place], [place] [address] [phone]

## 2018-06-02 ENCOUNTER — Ambulatory Visit: Payer: Medicare HMO | Admitting: Nurse Practitioner

## 2018-06-02 ENCOUNTER — Encounter: Payer: Self-pay | Admitting: Nurse Practitioner

## 2018-06-02 ENCOUNTER — Telehealth: Payer: Self-pay | Admitting: *Deleted

## 2018-06-02 VITALS — BP 155/69 | HR 54 | Ht 62.0 in | Wt 153.0 lb

## 2018-06-02 DIAGNOSIS — I69398 Other sequelae of cerebral infarction: Secondary | ICD-10-CM

## 2018-06-02 DIAGNOSIS — R269 Unspecified abnormalities of gait and mobility: Secondary | ICD-10-CM | POA: Diagnosis not present

## 2018-06-02 DIAGNOSIS — Z8673 Personal history of transient ischemic attack (TIA), and cerebral infarction without residual deficits: Secondary | ICD-10-CM

## 2018-06-02 DIAGNOSIS — E782 Mixed hyperlipidemia: Secondary | ICD-10-CM

## 2018-06-02 DIAGNOSIS — I10 Essential (primary) hypertension: Secondary | ICD-10-CM | POA: Diagnosis not present

## 2018-06-02 DIAGNOSIS — G35 Multiple sclerosis: Secondary | ICD-10-CM

## 2018-06-02 NOTE — Patient Instructions (Signed)
Stressed the importance of management of risk factors to prevent further stroke Continue Xarelto and Plavix for secondary stroke prevention Maintain strict control of hypertension with blood pressure goal below 130/90,  continue antihypertensive medications Control of diabetes with hemoglobin A1c below 6.5 followed by primary care  Cholesterol with LDL cholesterol less than 70, followed by primary care,   continue Repatha Continue HEP gait abnormality use walker for safe ambulation  eat healthy diet with whole grains,  fresh fruits and vegetables Follow-up with primary care for stroke risk factor modification, maintain blood pressure goal less than 130 systolic, diabetes with A1c below 7, lipids with LDL below 70  F/U here in 8 months

## 2018-06-02 NOTE — Telephone Encounter (Signed)
Called Dr. Lindaann Slough pt re: Stress Test Results.  Left a message for pt to call back.

## 2018-06-02 NOTE — Telephone Encounter (Signed)
-----   Message from Loa Socks, LPN sent at 19/08/4780  8:30 AM EDT -----   ----- Message ----- From: Lars Masson, MD Sent: 06/01/2018   8:21 AM EDT To: Loa Socks, LPN  No ischemia, normal study

## 2018-06-05 ENCOUNTER — Other Ambulatory Visit (INDEPENDENT_AMBULATORY_CARE_PROVIDER_SITE_OTHER): Payer: Medicare HMO

## 2018-06-05 ENCOUNTER — Encounter: Payer: Self-pay | Admitting: Internal Medicine

## 2018-06-05 ENCOUNTER — Ambulatory Visit: Payer: Medicare HMO | Admitting: Internal Medicine

## 2018-06-05 VITALS — BP 120/60 | HR 60 | Ht 61.75 in | Wt 154.2 lb

## 2018-06-05 DIAGNOSIS — R7989 Other specified abnormal findings of blood chemistry: Secondary | ICD-10-CM

## 2018-06-05 DIAGNOSIS — R932 Abnormal findings on diagnostic imaging of liver and biliary tract: Secondary | ICD-10-CM

## 2018-06-05 DIAGNOSIS — K760 Fatty (change of) liver, not elsewhere classified: Secondary | ICD-10-CM | POA: Diagnosis not present

## 2018-06-05 DIAGNOSIS — R945 Abnormal results of liver function studies: Secondary | ICD-10-CM

## 2018-06-05 LAB — IRON: Iron: 118 ug/dL (ref 42–145)

## 2018-06-05 LAB — FERRITIN: FERRITIN: 36.9 ng/mL (ref 10.0–291.0)

## 2018-06-05 LAB — IBC PANEL
IRON: 118 ug/dL (ref 42–145)
Saturation Ratios: 23.7 % (ref 20.0–50.0)
TRANSFERRIN: 355 mg/dL (ref 212.0–360.0)

## 2018-06-05 NOTE — Patient Instructions (Addendum)
Your provider has requested that you go to the basement level for lab work before leaving today. Press "B" on the elevator. The lab is located at the first door on the left as you exit the elevator.   Please follow up on __12/04/2018 1:30pm_________________.

## 2018-06-05 NOTE — Progress Notes (Signed)
HISTORY OF PRESENT ILLNESS:  Kaitlin [name] is a 72 y.o. female with multiple significant medical problems including coronary artery disease with prior percutaneous intervention on Plavix, atrial fibrillation with a history of stroke on Xarelto, multiple sclerosis, hypertension, hyperlipidemia, and a history of deep vein thrombosis.  She is sent today by her primary care provider Dr. Felipa Eth regarding elevated liver test.  She is accompanied by her husband (my patient) who assists with the history as the patient has some level of memory deficit.  I am told that she was initially diagnosed with elevated liver tests April 2017.  Some of her medications were adjusted with improvement though not normalization of liver test.  She had a stroke July 2018.  She has had persistently abnormal liver tests throughout.  Namely, hepatic transaminases.  Review of laboratories from August 2019 showed AST 77 and ALT 120.  Normal alkaline phosphatase and bilirubin.  Several new medications in recent weeks.  She is on Evolocumab for her multiple sclerosis.  She and her husband do enjoy 1-2 glasses of wine in the evening.  They did stop alcohol for 3 weeks without improvement in liver test.  She has gained 25 pounds since her stroke.  Review of additional laboratories from May 01, 2018 reveals AST 94 and ALT 102.  Normal protein and albumin.  CBC with hemoglobin 10.4.  Normal MCV.  Normal platelets.  INR 1.6.  Abdominal ultrasound performed March 13, 2018 reveals increased hepatic echogenicity of the parenchyma suggesting fatty infiltration.  No other abnormalities.  Patient denies a family history of liver disease.  GI review of systems is negative except for constipation.  She has had multiple colonoscopies with Dr. Kinnie Scales 2002, 2009, 2014.  REVIEW OF SYSTEMS:  All non-GI ROS negative unless otherwise stated in the HPI except for memory deficit, right-sided weakness, shortness of breath, ankle swelling, confusion, fatigue,  arthritis, sinus and allergy  Past Medical History:  Diagnosis Date  . Abnormality of gait as late effect of stroke 02/23/2017  . Acute hypoxemic respiratory failure (HCC)   . Arthritis   . Atrial fibrillation with RVR (HCC) 02/17/2017  . CAD S/P percutaneous coronary angioplasty 2001  . Colon polyps   . CVA (cerebral vascular accident) (HCC) 01/2017  . Dysphagia as late effect of stroke 02/23/2017  . H/O blood clots   . HCVD (hypertensive cardiovascular disease) 01/2017   normal LVF with moderate LVH, grade 1 DD  . High cholesterol   . HTN (hypertension)   . Hypokalemia   . Labile blood pressure   . Leukocytosis   . Multiple sclerosis (HCC)   . PAF (paroxysmal atrial fibrillation) (HCC) 01/2017  . Right hemiparesis [facility])     Past Surgical History:  Procedure Laterality Date  . CORONARY ANGIOPLASTY WITH STENT PLACEMENT  11/1999   [facility]CA DES-[facility]  . IR ANGIO INTRA EXTRACRAN SEL COM CAROTID INNOMINATE UNI R MOD SED  02/15/2017  . IR ANGIO VERTEBRAL SEL SUBCLAVIAN INNOMINATE UNI R MOD SED  02/15/2017  . IR INTRAVSC STENT CERV CAROTID W/O EMB-PROT MOD SED INC ANGIO  02/15/2017  . IR PERCUTANEOUS ART THROMBECTOMY/INFUSION INTRACRANIAL INC DIAG ANGIO  02/15/2017  . IR RADIOLOGIST EVAL & MGMT  05/10/2017  . NECK SURGERY    . RADIOLOGY WITH ANESTHESIA N/A 02/14/2017   Procedure: RADIOLOGY WITH ANESTHESIA;  Surgeon: Radiologist, Medication, MD;  Location: [facility] OR;  Service: Radiology;  Laterality: N/A;  . WRIST SURGERY Left     Social History ASHELEE ROTAR  reports that she quit smoking about 34 years ago. Her smoking use included cigarettes. She has never used smokeless tobacco. She reports that she drinks about 3.0 standard drinks of alcohol per week. She reports that she does not use drugs.  family history includes Cancer in her father; Heart disease in her father; Multiple sclerosis in her sister; Ovarian cancer in her mother.  Allergies  Allergen Reactions  . Eliquis [Apixaban] Rash   . Penicillins Other (See Comments)    From childhood        PHYSICAL EXAMINATION: Vital signs: BP 120/60 (BP Location: Left Arm, Patient Position: Sitting, Cuff Size: Normal)   Pulse 60   Ht 5' 1.75" (1.568 m) Comment: height measured without shoes  Wt 154 lb 4 oz (70 kg)   BMI 28.44 kg/m   Constitutional: Somewhat frail and unhealthy appearing, no acute distress Psychiatric: alert and oriented x3, cooperative.  Pleasant Eyes: extraocular movements intact, anicteric, conjunctiva pink Mouth: oral pharynx moist, no lesions Neck: supple no lymphadenopathy Cardiovascular: heart regular rate and rhythm, no murmur Lungs: clear to auscultation bilaterally Abdomen: soft, nontender, nondistended, no obvious ascites, no peritoneal signs, normal bowel sounds, no organomegaly Rectal: Omitted Extremities: no clubbing or cyanosis.  Trace lower extremity edema bilaterally Skin: no lesions on visible extremities Neuro: No focal deficits save right-sided weakness.  Cranial nerves intact  ASSESSMENT:  #1.  Elevated hepatic transaminases.  Suspect fatty liver.  No evidence for decompensated liver disease or hepatic synthetic dysfunction #2.  Abnormal ultrasound consistent with fatty liver #3.  Multiple significant medical problems   PLAN:  1.  Check viral and nonviral studies to assess for possible causes of chronic elevation of hepatic transaminases 2.  Office follow-up 1 month

## 2018-06-08 DIAGNOSIS — Z6828 Body mass index (BMI) 28.0-28.9, adult: Secondary | ICD-10-CM | POA: Diagnosis not present

## 2018-06-08 DIAGNOSIS — I69959 Hemiplegia and hemiparesis following unspecified cerebrovascular disease affecting unspecified side: Secondary | ICD-10-CM | POA: Diagnosis not present

## 2018-06-08 DIAGNOSIS — G35 Multiple sclerosis: Secondary | ICD-10-CM | POA: Diagnosis not present

## 2018-06-08 DIAGNOSIS — I25119 Atherosclerotic heart disease of native coronary artery with unspecified angina pectoris: Secondary | ICD-10-CM | POA: Diagnosis not present

## 2018-06-08 DIAGNOSIS — E038 Other specified hypothyroidism: Secondary | ICD-10-CM | POA: Diagnosis not present

## 2018-06-08 DIAGNOSIS — R413 Other amnesia: Secondary | ICD-10-CM | POA: Diagnosis not present

## 2018-06-08 DIAGNOSIS — R69 Illness, unspecified: Secondary | ICD-10-CM | POA: Diagnosis not present

## 2018-06-08 DIAGNOSIS — I4891 Unspecified atrial fibrillation: Secondary | ICD-10-CM | POA: Diagnosis not present

## 2018-06-08 DIAGNOSIS — I10 Essential (primary) hypertension: Secondary | ICD-10-CM | POA: Diagnosis not present

## 2018-06-08 LAB — HEPATITIS C ANTIBODY
HEP C AB: NONREACTIVE
SIGNAL TO CUT-OFF: 0.01 (ref <1.00)

## 2018-06-08 LAB — HEPATITIS B SURFACE ANTIBODY,QUALITATIVE: Hep B S Ab: NONREACTIVE

## 2018-06-08 LAB — MITOCHONDRIAL ANTIBODIES

## 2018-06-08 LAB — TISSUE TRANSGLUTAMINASE, IGA: (tTG) Ab, IgA: 1 U/mL

## 2018-06-08 LAB — CERULOPLASMIN: Ceruloplasmin: 36 mg/dL (ref 18–53)

## 2018-06-08 LAB — ANA: ANA: NEGATIVE

## 2018-06-08 LAB — ALPHA-1-ANTITRYPSIN: A-1 Antitrypsin, Ser: 169 mg/dL (ref 83–199)

## 2018-06-08 LAB — HEPATITIS B SURFACE ANTIGEN: Hepatitis B Surface Ag: NONREACTIVE

## 2018-06-08 LAB — ANTI-SMOOTH MUSCLE ANTIBODY, IGG: Actin (Smooth Muscle) Antibody (IGG): <20 U (ref <20)

## 2018-06-14 NOTE — Progress Notes (Signed)
Made any corrections needed, and agree with history, physical, neuro exam,assessment and plan as stated above.     Jaquis Picklesimer, MD [facility] 

## 2018-06-27 ENCOUNTER — Other Ambulatory Visit: Payer: Medicare HMO | Admitting: *Deleted

## 2018-06-27 DIAGNOSIS — R0609 Other forms of dyspnea: Secondary | ICD-10-CM

## 2018-06-27 DIAGNOSIS — I10 Essential (primary) hypertension: Secondary | ICD-10-CM

## 2018-06-27 DIAGNOSIS — R0600 Dyspnea, unspecified: Secondary | ICD-10-CM

## 2018-06-27 DIAGNOSIS — E782 Mixed hyperlipidemia: Secondary | ICD-10-CM

## 2018-06-27 DIAGNOSIS — R600 Localized edema: Secondary | ICD-10-CM

## 2018-06-27 DIAGNOSIS — Z789 Other specified health status: Secondary | ICD-10-CM | POA: Diagnosis not present

## 2018-06-27 DIAGNOSIS — I2510 Atherosclerotic heart disease of native coronary artery without angina pectoris: Secondary | ICD-10-CM

## 2018-06-28 LAB — COMPREHENSIVE METABOLIC PANEL
ALT: 77 IU/L — ABNORMAL HIGH (ref 0–32)
AST: 64 IU/L — ABNORMAL HIGH (ref 0–40)
Albumin/Globulin Ratio: 1.5 (ref 1.2–2.2)
Albumin: 4.1 g/dL (ref 3.5–4.8)
Alkaline Phosphatase: 78 IU/L (ref 39–117)
BUN/Creatinine Ratio: 21 (ref 12–28)
BUN: 25 mg/dL (ref 8–27)
Bilirubin Total: 0.4 mg/dL (ref 0.0–1.2)
CO2: 20 mmol/L (ref 20–29)
Calcium: 9.6 mg/dL (ref 8.7–10.3)
Chloride: 98 mmol/L (ref 96–106)
Creatinine, Ser: 1.2 mg/dL — ABNORMAL HIGH (ref 0.57–1.00)
GFR calc Af Amer: 51 mL/min/{1.73_m2} — ABNORMAL LOW (ref >59)
GFR calc non Af Amer: 45 mL/min/{1.73_m2} — ABNORMAL LOW (ref >59)
Globulin, Total: 2.7 g/dL (ref 1.5–4.5)
Glucose: 86 mg/dL (ref 65–99)
Potassium: 5 mmol/L (ref 3.5–5.2)
Sodium: 136 mmol/L (ref 134–144)
Total Protein: 6.8 g/dL (ref 6.0–8.5)

## 2018-06-28 LAB — LIPID PANEL
Chol/HDL Ratio: 1.7 ratio (ref 0.0–4.4)
Cholesterol, Total: 173 mg/dL (ref 100–199)
HDL: 99 mg/dL (ref >39)
LDL Calculated: 51 mg/dL (ref 0–99)
Triglycerides: 116 mg/dL (ref 0–149)
VLDL Cholesterol Cal: 23 mg/dL (ref 5–40)

## 2018-07-03 ENCOUNTER — Other Ambulatory Visit: Payer: Self-pay | Admitting: *Deleted

## 2018-07-03 DIAGNOSIS — R748 Abnormal levels of other serum enzymes: Secondary | ICD-10-CM

## 2018-07-10 ENCOUNTER — Encounter: Payer: Self-pay | Admitting: Internal Medicine

## 2018-07-10 ENCOUNTER — Ambulatory Visit: Payer: Medicare HMO | Admitting: Internal Medicine

## 2018-07-10 VITALS — BP 110/52 | HR 63 | Ht 62.0 in | Wt 154.0 lb

## 2018-07-10 DIAGNOSIS — R7989 Other specified abnormal findings of blood chemistry: Secondary | ICD-10-CM

## 2018-07-10 DIAGNOSIS — R945 Abnormal results of liver function studies: Secondary | ICD-10-CM | POA: Diagnosis not present

## 2018-07-10 DIAGNOSIS — K760 Fatty (change of) liver, not elsewhere classified: Secondary | ICD-10-CM | POA: Diagnosis not present

## 2018-07-10 DIAGNOSIS — R932 Abnormal findings on diagnostic imaging of liver and biliary tract: Secondary | ICD-10-CM

## 2018-07-10 NOTE — Progress Notes (Signed)
HISTORY OF PRESENT ILLNESS:  Kaitlin [name] is a 72 y.o. female who was evaluated June 05, 2018 regarding elevated liver test.  See that dictation for details.  She underwent multiple studies evaluating for various viral and nonviral causes of elevated hepatic transaminases.  These were all negative or normal.  Imaging did show changes consistent with fatty liver.  She has gained 25 pounds in recent years.  Other considerations were medication related.  She has had her medications changed or adjusted.  Liver test as recently as June 27, 2018 were improved with AST 64, ALT 77.  Normal alkaline phosphatase and bilirubin.  Her hepatic synthetic function and platelets are normal.  REVIEW OF SYSTEMS:  All non-GI ROS negative less otherwise stated in the HPI except for arthritis, confusion, shortness of breath, fatigue  Past Medical History:  Diagnosis Date  . Abnormality of gait as late effect of stroke 02/23/2017  . Acute hypoxemic respiratory failure (HCC)   . Arthritis   . Atrial fibrillation with RVR (HCC) 02/17/2017  . CAD S/P percutaneous coronary angioplasty 2001  . Colon polyps   . CVA (cerebral vascular accident) (HCC) 01/2017  . Dysphagia as late effect of stroke 02/23/2017  . H/O blood clots   . HCVD (hypertensive cardiovascular disease) 01/2017   normal LVF with moderate LVH, grade 1 DD  . High cholesterol   . HTN (hypertension)   . Hypokalemia   . Labile blood pressure   . Leukocytosis   . Multiple sclerosis (HCC)   . PAF (paroxysmal atrial fibrillation) (HCC) 01/2017  . Right hemiparesis [facility])     Past Surgical History:  Procedure Laterality Date  . CORONARY ANGIOPLASTY WITH STENT PLACEMENT  11/1999   RCA DES-[facility]  . IR ANGIO INTRA EXTRACRAN SEL COM CAROTID INNOMINATE UNI R MOD SED  02/15/2017  . IR ANGIO VERTEBRAL SEL SUBCLAVIAN INNOMINATE UNI R MOD SED  02/15/2017  . IR INTRAVSC STENT CERV CAROTID W/O EMB-PROT MOD SED INC ANGIO  02/15/2017  . IR PERCUTANEOUS ART  THROMBECTOMY/INFUSION INTRACRANIAL INC DIAG ANGIO  02/15/2017  . IR RADIOLOGIST EVAL & MGMT  05/10/2017  . NECK SURGERY    . RADIOLOGY WITH ANESTHESIA N/A 02/14/2017   Procedure: RADIOLOGY WITH ANESTHESIA;  Surgeon: Radiologist, Medication, MD;  Location: [facility] OR;  Service: Radiology;  Laterality: N/A;  . WRIST SURGERY Left     Social History Kaitlin [name]  reports that she quit smoking about 34 years ago. Her smoking use included cigarettes. She has never used smokeless tobacco. She reports that she drinks about 3.0 standard drinks of alcohol per week. She reports that she does not use drugs.  family history includes Cancer in her father; Heart disease in her father; Multiple sclerosis in her sister; Ovarian cancer in her mother.  Allergies  Allergen Reactions  . Eliquis [Apixaban] Rash  . Penicillins Other (See Comments)    From childhood        PHYSICAL EXAMINATION: Vital signs: BP (!) 110/52   Pulse 63   Ht 5\' 2"  (1.575 m)   Wt 154 lb (69.9 kg)   BMI 28.17 kg/m   Constitutional: generally well-appearing, no acute distress Psychiatric: alert and oriented x3, cooperative Abdomen: Not reexamined   ASSESSMENT:  1.  Mild elevation of hepatic transaminases without evidence for clinically relevant liver disease.  Suspect fatty liver based on history and imaging changes   PLAN:  1.  Weight loss 2.  Alcohol in moderation 3.  Routine office follow-up 1 year  15-minute spent face-to-face with the patient and her husband.  Greater than 50% the time used for counseling regarding liver test abnormalities, the current state of her liver function, and follow-up plans and recommendations

## 2018-07-10 NOTE — Patient Instructions (Signed)
If you are age 74 or older, your body mass index should be between 23-30. Your Body mass index is 28.17 kg/m. If this is out of the aforementioned range listed, please consider follow up with your Primary Care Provider.  If you are age 42 or younger, your body mass index should be between 19-25. Your Body mass index is 28.17 kg/m. If this is out of the aformentioned range listed, please consider follow up with your Primary Care Provider.   Follow up in 1 year.   It was a pleasure to see you today!

## 2018-08-21 ENCOUNTER — Encounter: Payer: Self-pay | Admitting: Cardiology

## 2018-08-21 ENCOUNTER — Ambulatory Visit: Payer: Medicare HMO | Admitting: Cardiology

## 2018-08-21 VITALS — BP 138/68 | HR 62 | Ht 62.0 in | Wt 156.2 lb

## 2018-08-21 DIAGNOSIS — R748 Abnormal levels of other serum enzymes: Secondary | ICD-10-CM

## 2018-08-21 DIAGNOSIS — R7989 Other specified abnormal findings of blood chemistry: Secondary | ICD-10-CM

## 2018-08-21 DIAGNOSIS — R945 Abnormal results of liver function studies: Secondary | ICD-10-CM | POA: Diagnosis not present

## 2018-08-21 DIAGNOSIS — Z7901 Long term (current) use of anticoagulants: Secondary | ICD-10-CM

## 2018-08-21 DIAGNOSIS — E782 Mixed hyperlipidemia: Secondary | ICD-10-CM | POA: Diagnosis not present

## 2018-08-21 DIAGNOSIS — I2510 Atherosclerotic heart disease of native coronary artery without angina pectoris: Secondary | ICD-10-CM | POA: Diagnosis not present

## 2018-08-21 DIAGNOSIS — Z5309 Procedure and treatment not carried out because of other contraindication: Secondary | ICD-10-CM

## 2018-08-21 NOTE — Progress Notes (Signed)
Cardiology Office Note    Date:  08/21/2018   ID:  Kaitlin [name], Kaitlin [name] [birth year], MRN [id number]  PCP:  Chilton Greathouse, MD  Cardiologist: Dr. Delton See  Reason for visit: 6 moths follow up  History of Present Illness:  Kaitlin [name] is a 73 y.o. female a PMH significant for CAD, s/p remote RCA PCI 2001, HTN, HLD, and stable multiple sclerosis. She presented to [facility] 02/15/17 with a L MCA occlusion s/p LMCA PTA and LICA PTA/stenting. While on telemetry she had documented PAF and anticoagulation was initiated. She remained on Plavix and Xarelto.CHADSVASC=5. She was maintaining normal sinus rhythm on amiodarone and beta blocker. 2-D echo LVEF 65-70% with mild AS, no cardiac source of emboli. Patient was transferred to cardiac rehabilitation. Patient was discharged from inpatient rehabilitation 03/19/17.   06/17/2017 -  this 2 months follow-up, she was seen by Kaitlin [name], she's been complaining off episodes of feeling short of breath when she stands up or when she goes to the bathroom at night. However when she goes to cardiac rehabilitation and walks around for longer time she feels perfectly fine and has no shortness of breath chest pressure or tightness. 2 weeks ago she had a mechanical fall when she tripped over her walker and hit her head. She was hospitalized with finding of a right anterior frontal subarachnoid and parenchymal hemorrhage. The patient was continued on post Xarelto and Plavix, she supposed to follow with neurology in 2 weeks and if stable findings of her carotid stent her neurologist will consider discontinuation of Plavix. She denies any palpitations dizziness presyncope or syncope.  09/22/2017 - she comes accompanied by her husband, they enjoy walks and some light exercises, they used to be avid hikers, and hoping to start this summer again.  She denies any chest pain DOE, LE edema, palpitations or syncope, she has been compliant with her meds and had no side  effects.  05/18/2018 - 6 months follow up, denies chest pain or SOB, but has noticed elevated blood pressures and mild LE edema. No bleeding with Xarelto. Tolerates Repatha well.   08/21/2018 -this is 6 months follow-up, the patient is doing better, she is able to walk with no dizziness or falls.  She exercises on a regular basis.  Her blood pressure has been well controlled, she tolerates her medications well.  She denies any palpitation, she has no bleeding.  Past Medical History:  Diagnosis Date  . Abnormality of gait as late effect of stroke 02/23/2017  . Acute hypoxemic respiratory failure (HCC)   . Arthritis   . Atrial fibrillation with RVR (HCC) 02/17/2017  . CAD S/P percutaneous coronary angioplasty 2001  . Colon polyps   . CVA (cerebral vascular accident) (HCC) 01/2017  . Dysphagia as late effect of stroke 02/23/2017  . H/O blood clots   . HCVD (hypertensive cardiovascular disease) 01/2017   normal LVF with moderate LVH, grade 1 DD  . High cholesterol   . HTN (hypertension)   . Hypokalemia   . Labile blood pressure   . Leukocytosis   . Multiple sclerosis (HCC)   . PAF (paroxysmal atrial fibrillation) (HCC) 01/2017  . Right hemiparesis [facility])     Past Surgical History:  Procedure Laterality Date  . CORONARY ANGIOPLASTY WITH STENT PLACEMENT  11/1999   [facility]CA DES[facility]  . IR ANGIO INTRA EXTRACRAN SEL COM CAROTID INNOMINATE UNI R MOD SED  02/15/2017  . IR ANGIO VERTEBRAL SEL SUBCLAVIAN INNOMINATE UNI R MOD SED  02/15/2017  .  IR INTRAVSC STENT CERV CAROTID W/O EMB-PROT MOD SED INC ANGIO  02/15/2017  . IR PERCUTANEOUS ART THROMBECTOMY/INFUSION INTRACRANIAL INC DIAG ANGIO  02/15/2017  . IR RADIOLOGIST EVAL & MGMT  05/10/2017  . NECK SURGERY    . RADIOLOGY WITH ANESTHESIA N/A 02/14/2017   Procedure: RADIOLOGY WITH ANESTHESIA;  Surgeon: Radiologist, Medication, MD;  Location: [facility] OR;  Service: Radiology;  Laterality: N/A;  . WRIST SURGERY Left     Current Medications: Current Meds   Medication Sig  . Calcium Citrate-Vitamin D (CALCIUM CITRATE + D PO) Take 1 tablet by mouth daily.  . cholecalciferol (VITAMIN D) 1000 units tablet Take 3,000 Units by mouth daily.  . clopidogrel (PLAVIX) 75 MG tablet Take 1 tablet (75 mg total) by mouth daily.  . Evolocumab (REPATHA SURECLICK) 140 MG/ML SOAJ Inject 1 pen into the skin every 14 (fourteen) days.  Marland Kitchen. FLUoxetine (PROZAC) 10 MG capsule Take 1 capsule (10 mg total) by mouth daily.  . hydrochlorothiazide (HYDRODIURIL) 25 MG tablet Take 1 tablet (25 mg total) by mouth daily.  Marland Kitchen. lisinopril (PRINIVIL,ZESTRIL) 2.5 MG tablet Take 1 tablet (2.5 mg total) by mouth daily.  . metoprolol tartrate (LOPRESSOR) 50 MG tablet Take 1 tablet (50 mg total) by mouth 2 (two) times daily.  . Multiple Vitamins-Minerals (PRESERVISION AREDS PO) Take 1 tablet by mouth 2 (two) times daily.  . pantoprazole (PROTONIX) 40 MG tablet Take 1 tablet (40 mg total) by mouth daily.  . Rivaroxaban (XARELTO) 15 MG TABS tablet Take 1 tablet (15 mg total) by mouth daily with supper.     Allergies:   Eliquis [apixaban] and Penicillins   Social History   Socioeconomic History  . Marital status: Married    Spouse name: Not on file  . Number of children: 0  . Years of education: Not on file  . Highest education level: Not on file  Occupational History  . Occupation: retired  Engineer, productionocial Needs  . Financial resource strain: Not on file  . Food insecurity:    Worry: Not on file    Inability: Not on file  . Transportation needs:    Medical: Not on file    Non-medical: Not on file  Tobacco Use  . Smoking status: Former Smoker    Types: Cigarettes    Last attempt to quit: 1985    Years since quitting: 35.0  . Smokeless tobacco: Never Used  Substance and Sexual Activity  . Alcohol use: Yes    Alcohol/week: 3.0 standard drinks    Types: 3 Glasses of wine per week    Comment: wine 1-2 per day  . Drug use: No  . Sexual activity: Not on file  Lifestyle  . Physical  activity:    Days per week: Not on file    Minutes per session: Not on file  . Stress: Not on file  Relationships  . Social connections:    Talks on phone: Not on file    Gets together: Not on file    Attends religious service: Not on file    Active member of club or organization: Not on file    Attends meetings of clubs or organizations: Not on file    Relationship status: Not on file  Other Topics Concern  . Not on file  Social History Narrative  . Not on file     Family History:  The patient's family history includes Cancer in her father; Heart disease in her father; Multiple sclerosis in her sister; Ovarian cancer in  her mother.   ROS:   Please see the history of present illness.    Review of Systems  Constitution: Negative.  HENT: Negative.   Eyes: Negative.   Cardiovascular: Negative.   Respiratory: Negative.   Hematologic/Lymphatic: Negative.   Musculoskeletal: Negative.  Negative for joint pain.  Gastrointestinal: Negative.   Genitourinary: Negative.   Neurological: Positive for difficulty with concentration, disturbances in coordination and loss of balance.  Psychiatric/Behavioral: Positive for memory loss.   All other systems reviewed and are negative.  PHYSICAL EXAM:   VS:  BP 138/68   Pulse 62   Ht 5\' 2"  (1.575 m)   Wt 156 lb 3.2 oz (70.9 kg)   SpO2 97%   BMI 28.57 kg/m   Physical Exam  GEN: Well nourished, well developed, in no acute distress  Neck: Bilateral carotid bruits no JVD,or masses Cardiac:RRR; 2/6 systolic murmur at the left sternal border Respiratory:  clear to auscultation bilaterally, normal work of breathing GI: soft, nontender, nondistended, + BS Ext: Small lesion where she bled anterior tibia from bumping it otherwise lower extremities without cyanosis, clubbing, or edema, Good distal pulses bilaterally Neuro:  Alert and Oriented x 3 Psych: euthymic mood, full affect  Wt Readings from Last 3 Encounters:  08/21/18 156 lb 3.2 oz (70.9  kg)  07/10/18 154 lb (69.9 kg)  06/05/18 154 lb 4 oz (70 kg)      Studies/Labs Reviewed:   EKG:  EKG is  ordered today.  The ekg ordered today demonstrates Normal sinus rhythm with T wave inversion inferior, anterior lateral changed from 2013 but similar to the hospital  Recent Labs: 06/27/2018: ALT 77; BUN 25; Creatinine, Ser 1.20; Potassium 5.0; Sodium 136   Lipid Panel    Component Value Date/Time   CHOL 173 06/27/2018 1409   TRIG 116 06/27/2018 1409   HDL 99 06/27/2018 1409   CHOLHDL 1.7 06/27/2018 1409   CHOLHDL 2.5 02/15/2017 0420   VLDL 29 02/15/2017 0420   LDLCALC 51 06/27/2018 1409    Additional studies/ records that were reviewed today include:   Echo 02/16/17- Study Conclusions   - Left ventricle: The cavity size was normal. There was moderate   concentric hypertrophy. Systolic function was vigorous. The   estimated ejection fraction was in the range of 65% to 70%. Wall   motion was normal; there were no regional wall motion   abnormalities. Doppler parameters are consistent with abnormal   left ventricular relaxation (grade 1 diastolic dysfunction).   Doppler parameters are consistent with elevated ventricular   end-diastolic filling pressure. - Aortic valve: Trileaflet; mildly thickened, mildly calcified   leaflets. There was mild stenosis. Mean gradient (S): 11 mm Hg.   Peak gradient (S): 17 mm Hg. Valve area (VTI): 1.45 cm^2. Valve   area (Vmax): 1.73 cm^2. Valve area (Vmean): 1.44 cm^2. - Mitral valve: Calcified annulus. Mildly thickened leaflets .   There was trivial regurgitation. - Left atrium: The atrium was normal in size. - Right ventricle: The cavity size was normal. Wall thickness was   normal. Systolic function was normal. - Right atrium: The atrium was normal in size. - Tricuspid valve: There was trivial regurgitation. - Pulmonary arteries: Systolic pressure was within the normal   range. - Inferior vena cava: The vessel was normal in  size. - Pericardium, extracardiac: There was no pericardial effusion.   Impressions:   - No cardiac source of emboli was indentified.    ASSESSMENT:    1. Elevated liver enzymes  2. Coronary artery disease involving native coronary artery of native heart without angina pectoris   3. Elevated liver function tests   4. Mixed hyperlipidemia   5. Chronic anticoagulation   6. Contraindication to percutaneous coronary intervention (PCI)     PLAN:  In order of problems listed above:  LMCA infarct:  S/p LMCA PTA and LICA PTA/stenting on 7/17.  She remains on plavix and xarelto (in the setting of afib).  She has had 2 falls the last summer with subarachnoid hemorrhage but none since then. Most recent carotid ultrasound from September 2019 showed bilateral less than 40% stenosis.  PAF on amiodarone and Xarelto maintaining normal sinus rhythm, we'll continue Xarelto. No bleeding.   Hypertension -well controlled.  Lower extremity edema has improved after adding hydrochlorothiazide.  Lower extremity edema -resolved with adding HCTZ 25 mg po daily  Hyperlipidemia on Repatha.  Tolerated well.  CAD with remote PCI to the RCA - asymptomatic  Elevated LFTs -we will repeat labs today.  Medication Adjustments/Labs and Tests Ordered: Current medicines are reviewed at length with the patient today.  Concerns regarding medicines are outlined above.  Medication changes, Labs and Tests ordered today are listed in the Patient Instructions below. There are no Patient Instructions on file for this visit.   Signed, Tobias AlexanderKatarina Nealy Karapetian, MD  08/21/2018 11:08 AM    [facility] [address], [place], [place]  [address] Phone: [phone]; Fax: [phone]

## 2018-08-21 NOTE — Patient Instructions (Signed)
Medication Instructions:   Your physician recommends that you continue on your current medications as directed. Please refer to the Current Medication list given to you today.  If you need a refill on your cardiac medications before your next appointment, please call your pharmacy.     Lab work:  TODAY--CMET AND CBC W DIFF  If you have labs (blood work) drawn today and your tests are completely normal, you will receive your results only by: Marland Kitchen MyChart Message (if you have MyChart) OR . A paper copy in the mail If you have any lab test that is abnormal or we need to change your treatment, we will call you to review the results.     Follow-Up: At [facility], you and your health needs are our priority.  As part of our continuing mission to provide you with exceptional heart care, we have created designated Provider Care Teams.  These Care Teams include your primary Cardiologist (physician) and Advanced Practice Providers (APPs -  Physician Assistants and Nurse Practitioners) who all work together to provide you with the care you need, when you need it. You will need a follow up appointment in 6 months.  Please call our office 2 months in advance to schedule this appointment.  You may see Tobias Alexander, MD or one of the following Advanced Practice Providers on your designated Care Team:   Kemmerer, PA-C Ronie Spies, PA-C . Jacolyn Reedy, PA-C

## 2018-08-22 LAB — COMPREHENSIVE METABOLIC PANEL
ALT: 61 IU/L — ABNORMAL HIGH (ref 0–32)
AST: 51 IU/L — ABNORMAL HIGH (ref 0–40)
Albumin/Globulin Ratio: 1.9 (ref 1.2–2.2)
Albumin: 4.6 g/dL (ref 3.7–4.7)
Alkaline Phosphatase: 73 IU/L (ref 39–117)
BUN/Creatinine Ratio: 21 (ref 12–28)
BUN: 29 mg/dL — ABNORMAL HIGH (ref 8–27)
Bilirubin Total: 0.3 mg/dL (ref 0.0–1.2)
CO2: 22 mmol/L (ref 20–29)
Calcium: 9.9 mg/dL (ref 8.7–10.3)
Chloride: 97 mmol/L (ref 96–106)
Creatinine, Ser: 1.4 mg/dL — ABNORMAL HIGH (ref 0.57–1.00)
GFR calc Af Amer: 43 mL/min/{1.73_m2} — ABNORMAL LOW (ref >59)
GFR calc non Af Amer: 37 mL/min/{1.73_m2} — ABNORMAL LOW (ref >59)
Globulin, Total: 2.4 g/dL (ref 1.5–4.5)
Glucose: 100 mg/dL — ABNORMAL HIGH (ref 65–99)
Potassium: 5.4 mmol/L — ABNORMAL HIGH (ref 3.5–5.2)
Sodium: 135 mmol/L (ref 134–144)
Total Protein: 7 g/dL (ref 6.0–8.5)

## 2018-08-22 LAB — CBC WITH DIFFERENTIAL/PLATELET
Basophils Absolute: 0 10*3/uL (ref 0.0–0.2)
Basos: 1 %
EOS (ABSOLUTE): 0.2 10*3/uL (ref 0.0–0.4)
Eos: 2 %
Hematocrit: 31.6 % — ABNORMAL LOW (ref 34.0–46.6)
Hemoglobin: 10.4 g/dL — ABNORMAL LOW (ref 11.1–15.9)
Immature Grans (Abs): 0 10*3/uL (ref 0.0–0.1)
Immature Granulocytes: 1 %
Lymphocytes Absolute: 1.6 10*3/uL (ref 0.7–3.1)
Lymphs: 18 %
MCH: 30.4 pg (ref 26.6–33.0)
MCHC: 32.9 g/dL (ref 31.5–35.7)
MCV: 92 fL (ref 79–97)
Monocytes Absolute: 0.8 10*3/uL (ref 0.1–0.9)
Monocytes: 9 %
Neutrophils Absolute: 6.1 10*3/uL (ref 1.4–7.0)
Neutrophils: 69 %
Platelets: 271 10*3/uL (ref 150–450)
RBC: 3.42 x10E6/uL — ABNORMAL LOW (ref 3.77–5.28)
RDW: 13 % (ref 11.7–15.4)
WBC: 8.7 10*3/uL (ref 3.4–10.8)

## 2018-08-23 ENCOUNTER — Telehealth: Payer: Self-pay | Admitting: *Deleted

## 2018-08-23 MED ORDER — HYDROCHLOROTHIAZIDE 12.5 MG PO CAPS: 12.5000 mg | ORAL_CAPSULE | Freq: Every day | ORAL | 1 refills | Status: DC

## 2018-08-23 NOTE — Telephone Encounter (Signed)
Spoke with the pts husband (on [place]) and endorsed to him the pts lab results and recommendations per Dr Delton See. Advised the pts Husband that Dr Delton See recommends that she cut her HCTZ to 12.5 mg po daily, and she should increase her fluid intake and strictly monitor sodium intake.  Confirmed the pharmacy of choice with the husband.  He states they have enough on hand at this time.  Husband also states he has a pill splitter at home as well. Placed a note to pharmacy that pt will call for further refills.  Husband verbalized understanding and agrees with this plan.  Husband will endorse these recommendations to the pt.

## 2018-08-23 NOTE — Telephone Encounter (Signed)
-----   Message from Lars Masson, MD sent at 08/21/2018  5:10 PM EST ----- Her liver function continues to normalize, her creatinine is slightly elevated and I would cut hydrochlorothiazide to 12.5 mg daily.  She should increase her fluid intake but strictly monitor salt intake.

## 2018-11-09 ENCOUNTER — Other Ambulatory Visit: Payer: Self-pay | Admitting: Cardiology

## 2018-11-09 DIAGNOSIS — Z789 Other specified health status: Secondary | ICD-10-CM

## 2018-11-09 DIAGNOSIS — R0609 Other forms of dyspnea: Secondary | ICD-10-CM

## 2018-11-09 DIAGNOSIS — R0600 Dyspnea, unspecified: Secondary | ICD-10-CM

## 2018-11-09 DIAGNOSIS — E782 Mixed hyperlipidemia: Secondary | ICD-10-CM

## 2018-11-09 DIAGNOSIS — R600 Localized edema: Secondary | ICD-10-CM

## 2018-11-09 DIAGNOSIS — I10 Essential (primary) hypertension: Secondary | ICD-10-CM

## 2018-11-09 DIAGNOSIS — I2510 Atherosclerotic heart disease of native coronary artery without angina pectoris: Secondary | ICD-10-CM

## 2018-11-13 ENCOUNTER — Ambulatory Visit: Payer: Medicare HMO | Admitting: Neurology

## 2018-11-29 DIAGNOSIS — E038 Other specified hypothyroidism: Secondary | ICD-10-CM | POA: Diagnosis not present

## 2018-11-29 DIAGNOSIS — E7849 Other hyperlipidemia: Secondary | ICD-10-CM | POA: Diagnosis not present

## 2018-11-29 DIAGNOSIS — M859 Disorder of bone density and structure, unspecified: Secondary | ICD-10-CM | POA: Diagnosis not present

## 2018-11-30 DIAGNOSIS — R7989 Other specified abnormal findings of blood chemistry: Secondary | ICD-10-CM | POA: Diagnosis not present

## 2018-11-30 DIAGNOSIS — D649 Anemia, unspecified: Secondary | ICD-10-CM | POA: Diagnosis not present

## 2018-12-04 ENCOUNTER — Other Ambulatory Visit: Payer: Medicare HMO

## 2018-12-06 DIAGNOSIS — Z0000 Encounter for general adult medical examination without abnormal findings: Secondary | ICD-10-CM | POA: Diagnosis not present

## 2018-12-06 DIAGNOSIS — E785 Hyperlipidemia, unspecified: Secondary | ICD-10-CM | POA: Diagnosis not present

## 2018-12-06 DIAGNOSIS — G35 Multiple sclerosis: Secondary | ICD-10-CM | POA: Diagnosis not present

## 2018-12-06 DIAGNOSIS — I10 Essential (primary) hypertension: Secondary | ICD-10-CM | POA: Diagnosis not present

## 2018-12-06 DIAGNOSIS — I69959 Hemiplegia and hemiparesis following unspecified cerebrovascular disease affecting unspecified side: Secondary | ICD-10-CM | POA: Diagnosis not present

## 2018-12-06 DIAGNOSIS — D649 Anemia, unspecified: Secondary | ICD-10-CM | POA: Diagnosis not present

## 2018-12-06 DIAGNOSIS — Z7901 Long term (current) use of anticoagulants: Secondary | ICD-10-CM | POA: Diagnosis not present

## 2018-12-06 DIAGNOSIS — Z1339 Encounter for screening examination for other mental health and behavioral disorders: Secondary | ICD-10-CM | POA: Diagnosis not present

## 2018-12-06 DIAGNOSIS — I4891 Unspecified atrial fibrillation: Secondary | ICD-10-CM | POA: Diagnosis not present

## 2018-12-06 DIAGNOSIS — Z1331 Encounter for screening for depression: Secondary | ICD-10-CM | POA: Diagnosis not present

## 2018-12-06 DIAGNOSIS — I25119 Atherosclerotic heart disease of native coronary artery with unspecified angina pectoris: Secondary | ICD-10-CM | POA: Diagnosis not present

## 2018-12-06 DIAGNOSIS — R69 Illness, unspecified: Secondary | ICD-10-CM | POA: Diagnosis not present

## 2018-12-11 DIAGNOSIS — Z1212 Encounter for screening for malignant neoplasm of rectum: Secondary | ICD-10-CM | POA: Diagnosis not present

## 2018-12-19 ENCOUNTER — Ambulatory Visit (INDEPENDENT_AMBULATORY_CARE_PROVIDER_SITE_OTHER): Payer: Medicare HMO | Admitting: Internal Medicine

## 2018-12-19 ENCOUNTER — Encounter: Payer: Self-pay | Admitting: Internal Medicine

## 2018-12-19 ENCOUNTER — Other Ambulatory Visit: Payer: Self-pay

## 2018-12-19 VITALS — Ht 62.0 in | Wt 156.0 lb

## 2018-12-19 DIAGNOSIS — D500 Iron deficiency anemia secondary to blood loss (chronic): Secondary | ICD-10-CM | POA: Diagnosis not present

## 2018-12-19 NOTE — Progress Notes (Addendum)
HISTORY OF PRESENT ILLNESS:  Kaitlin [name] is a 73 y.o. female with multiple significant medical problems including atrial fibrillation, coronary artery disease, prior stroke on both Plavix and Xarelto, right hemiparesis, neurogenic dysphasia, and multiple sclerosis.  She is scheduled for this telehealth medicine visit at request of her primary care provider Dr. Felipa Eth regarding iron deficiency anemia.  I last saw the patient July 10, 2018 after she underwent extensive evaluation for elevated liver test.  Most of the either fatty liver and alcohol versus drug.  The problem has resolved.  Recent evaluation revealed iron deficiency anemia with hemoglobin of 8.4, MCV 86.3, ferritin level 13.6.  Stool testing for blood was negative.  Iron saturation 6%.  Normal renal function.  Patient has undergone multiple prior colonoscopies with Dr. Sharrell Ku as previously outlined.  Most recent examination 2014.  No additional routine follow-up planned due to current age and significant comorbidities.  She does take chronic PPI daily.  GI review of systems is unremarkable.  Specifically, no abdominal pain, melena, hematochezia, change in bowel habits, or unexplained weight loss.  She is accompanied today by her husband who provides essential history.  She was placed on an iron supplement once daily along with vitamin C 1 week ago.  No other active issues or complaints  REVIEW OF SYSTEMS:  All non-GI ROS negative unless otherwise stated in the HPI except for mobility problems  Past Medical History:  Diagnosis Date  . Abnormality of gait as late effect of stroke 02/23/2017  . Acute hypoxemic respiratory failure (HCC)   . Anemia   . Arthritis   . Atrial fibrillation with RVR (HCC) 02/17/2017  . CAD S/P percutaneous coronary angioplasty 2001  . Colon polyps   . CVA (cerebral vascular accident) (HCC) 01/2017  . DDD (degenerative disc disease), cervical   . Dysphagia as late effect of stroke 02/23/2017  . H/O  blood clots   . HCVD (hypertensive cardiovascular disease) 01/2017   normal LVF with moderate LVH, grade 1 DD  . High cholesterol   . HTN (hypertension)   . Hypokalemia   . Labile blood pressure   . Leukocytosis   . Multiple sclerosis (HCC)   . Osteopenia   . PAF (paroxysmal atrial fibrillation) (HCC) 01/2017  . Right hemiparesis [facility])     Past Surgical History:  Procedure Laterality Date  . CORONARY ANGIOPLASTY WITH STENT PLACEMENT  11/1999   [facility]  . IR ANGIO INTRA EXTRACRAN SEL COM CAROTID INNOMINATE UNI R MOD SED  02/15/2017  . IR ANGIO VERTEBRAL SEL SUBCLAVIAN INNOMINATE UNI R MOD SED  02/15/2017  . IR INTRAVSC STENT CERV CAROTID W/O EMB-PROT MOD SED INC ANGIO  02/15/2017  . IR PERCUTANEOUS ART THROMBECTOMY/INFUSION INTRACRANIAL INC DIAG ANGIO  02/15/2017  . IR RADIOLOGIST EVAL & MGMT  05/10/2017  . NECK SURGERY    . RADIOLOGY WITH ANESTHESIA N/A 02/14/2017   Procedure: RADIOLOGY WITH ANESTHESIA;  Surgeon: Radiologist, Medication, MD;  Location: [facility] OR;  Service: Radiology;  Laterality: N/A;  . WRIST SURGERY Left     Social History Kaitlin [name]  reports that she quit smoking about 35 years ago. Her smoking use included cigarettes. She has never used smokeless tobacco. She reports current alcohol use of about 3.0 standard drinks of alcohol per week. She reports that she does not use drugs.  family history includes Cancer in her father; Heart disease in her father; Multiple sclerosis in her sister; Ovarian cancer in her mother.  Allergies  Allergen  Reactions  . Eliquis [Apixaban] Rash  . Penicillins Other (See Comments)    From childhood        PHYSICAL EXAMINATION: No physical examination with telehealth visit  ASSESSMENT:  1.  Iron deficiency anemia without evidence of acute, subacute, or occult blood loss.  This is a patient on both Plavix and Xarelto.  Suspect chronic intermittent GI blood loss from entity such as AVMs.  She does have upper GI mucosal  protection with chronic PPI use.  Cannot rule out neoplasia that she has had multiple regular colonoscopies throughout the years. 2.  MULTIPLE significant medical problems   PLAN:  1.  We discussed standard approach to working up new onset iron deficiency anemia would be colonoscopy and upper endoscopy as well as possible capsule endoscopy.  However, given her significant comorbidities and absence of acute or subacute bleeding we have elected for more conservative approach.  Somewhat reassuring that she has had regular colonoscopies with the last one being 2014.  Also somewhat reassuring that her stool was negative for occult blood and that she is completely asymptomatic.  We have agreed upon the following plan as follows 2.  Increase iron to twice daily 3.  Monitor ferritin and CBC once monthly for the next few months.  She wishes to have this done at Dr. Vicente Males office 4.  Come to medical attention for any evidence of overt GI bleeding (there has been none to date) 5.  Routine GI office follow-up 3 months.  Further plans that time.  They know to contact the office in the interim for any questions or problems  This telehealth medicine visit was initiated by the patient and consented for by the patient.  She was in her home and I was in my office during the encounter.  They understand there may be an associated professional charge for this service  ADDENDUM (March 20, 2019): I received blood work from Dr. office office dated January 10, 2019.  Hemoglobin 11.0.  MCV 96.9.  Ferritin level 183.4.  Significantly improved from previous levels.  Filed in epic

## 2018-12-19 NOTE — Patient Instructions (Signed)
1.  We discussed standard approach to working up new onset iron deficiency anemia would be colonoscopy and upper endoscopy as well as possible capsule endoscopy.  However, given her significant comorbidities and absence of acute or subacute bleeding we have elected for more conservative approach.   We have agreed upon the following plan as follows:  2.  Increase iron to twice daily 3.  Monitor ferritin and CBC once monthly for the next few months.  She wishes to have this done at Dr. Vicente Males office 4.  Come to medical attention for any evidence of overt GI bleeding (there has been none to date) 5.  Routine GI office follow-up 3 months.  Further plans that time.  They know to contact the office in the interim for any questions or problems

## 2018-12-27 DIAGNOSIS — Z7901 Long term (current) use of anticoagulants: Secondary | ICD-10-CM | POA: Diagnosis not present

## 2018-12-27 DIAGNOSIS — I69959 Hemiplegia and hemiparesis following unspecified cerebrovascular disease affecting unspecified side: Secondary | ICD-10-CM | POA: Diagnosis not present

## 2018-12-27 DIAGNOSIS — G35 Multiple sclerosis: Secondary | ICD-10-CM | POA: Diagnosis not present

## 2018-12-27 DIAGNOSIS — D649 Anemia, unspecified: Secondary | ICD-10-CM | POA: Diagnosis not present

## 2019-01-10 DIAGNOSIS — D649 Anemia, unspecified: Secondary | ICD-10-CM | POA: Diagnosis not present

## 2019-01-10 DIAGNOSIS — R7989 Other specified abnormal findings of blood chemistry: Secondary | ICD-10-CM | POA: Diagnosis not present

## 2019-01-30 ENCOUNTER — Other Ambulatory Visit (HOSPITAL_COMMUNITY): Payer: Self-pay | Admitting: Interventional Radiology

## 2019-01-30 ENCOUNTER — Telehealth (HOSPITAL_COMMUNITY): Payer: Self-pay

## 2019-01-30 DIAGNOSIS — I771 Stricture of artery: Secondary | ICD-10-CM

## 2019-01-30 NOTE — Telephone Encounter (Signed)
Called to schedule f/u us carotid, no answer, left vm. AW 

## 2019-01-31 ENCOUNTER — Other Ambulatory Visit: Payer: Self-pay

## 2019-01-31 ENCOUNTER — Encounter: Payer: Self-pay | Admitting: Family Medicine

## 2019-01-31 ENCOUNTER — Ambulatory Visit: Payer: Medicare HMO | Admitting: Family Medicine

## 2019-01-31 ENCOUNTER — Ambulatory Visit: Payer: Medicare HMO | Admitting: Adult Health

## 2019-01-31 VITALS — BP 138/78 | HR 64 | Temp 97.7°F | Ht 62.0 in | Wt 157.0 lb

## 2019-01-31 DIAGNOSIS — G35 Multiple sclerosis: Secondary | ICD-10-CM | POA: Diagnosis not present

## 2019-01-31 DIAGNOSIS — I6300 Cerebral infarction due to thrombosis of unspecified precerebral artery: Secondary | ICD-10-CM

## 2019-01-31 NOTE — Progress Notes (Signed)
PATIENT: Kaitlin [name] DOB: 02-Dec-1945  REASON FOR VISIT: follow up HISTORY FROM: patient  Chief Complaint  Patient presents with   Follow-up    8 month f/u. with husband, Rm 10. no concerns.     HISTORY OF PRESENT ILLNESS: Today 01/31/19 Kaitlin [name] is a 73 y.o. female here today for follow up. She has a history of left MCA embolic infarct with residual right hemiparesis in 2018 and MS off Betaseron for about 3 years. She denies any MS exacerbations. Last exacerbation was in 2008. She was noted to have elevated liver enzymes and was taken off Betaseron. She has not had any changes in vision, sensory changes or bowel/bladder changes. She continues to do well. She is followed by Dr Corliss Skainseveshwar (vascular) and Dr Delton SeeNelson (cardiology) regularly. She continues Plavix and Xarelto for stroke prevention and atrial fibrillation. She continues Repatha for HLD control and lisinopril, HCTZ and metoprolol for BP management. BP is 138/78 today. She is exercising with recumbent bike at least 2-3 times a week. She is using walker. No falls or injuries although she does bruise easily. She has not noted unusual bleeding.   HISTORY: (copied from [employer] Works[employer]yn Martin's note on 06/02/2018)  71 year caucasian ladyseen today for the first office follow-up visit following hospital admission for stroke in July 2018. She is accompanied by husband. History is up 10 from them as well as review of electronic medical records and have personally reviewed imaging films.Kaitlin QuinLinda Watkinsis an 73 y.o.femalewith a 20 year history of MS who presented as a Code Stroke for right sided weakness and garbled speech. Time of symptom onset was 11 AM while working in the yard, at which point she began to feel very hot and could not speak. She took a shower and symptoms seemed almost completely to resolve per husband. Subsequently, she had a nap and then at about 1700 she and her husband had a few glasses of wine, at which point he  noticed that she was having some difficulty using her right side and it "was hard for her to get up", the latter symptom noted at about 1900. CT head showed focal hypodensity in left insula and left posterior putamen compatible with infarction with ASPECTS of8. Also noted was hyperdensity of thedistal left M1.CTA neck showed Occlusion of the left common and internal carotid artery from the arch to the distal cavernous segment.CTA head showedpatent paraclinoid and terminal segments of left internal carotid artery and minimal flow in proximal left M1 likely retrograde via a tiny left posterior communicating artery.Occlusion of mid and distal left M1 with poor left MCA collateralization. Left A1 occlusion. Bilateral A2 are patent with the left likely perfused via the anterior communicating artery.   LSN:11 AM7/16/18tPA Given:No: Out of time window Of note, she has not had an MS exacerbation in several years and was recently taken off of Betaseron. Patient was admitted to the intensive care unit where blood pressure was tightly controlled. She was extubated and did well. She was seen by physical speech and occupational therapy. She was started on initially aspirin and Brilinta by Dr. Corliss Skainseveshwar forcarotid stent but subsequently switched to Plavix and eliquis when anticoagulation was started 5 days after her stroke and it was felt to be safe. She developed mild rash on both cheeks which was felt to be related to eliquis hence it was discontinued and she was started on Xarelto instead .She had some mild hematuria but hematocrit remained stable. She had mild expressive language difficulties  and mild residual right-sided hemiparesis at the time of discharge. She was transferred to inpatient rehabilitation.She did well in rehabilitation and was discharged home. She is planning to start outpatient physical and occupation therapy soon and has finished home therapies. She is unrelenting and Xarelto and  tolerating them well without bleeding episodes but did have significant episode of bruising of her right forearm last week when she accidentally hit her forearm on the edge of the bed. She states her blood pressure is well controlled and today it is 120/66. She is tolerating Lipitor well without muscle aches and pains. She feels she is physically about 80% better though she still has some occasional word finding difficulties and cannot speak Fluently. A fine finger movements and dexterity is also diminished in the right hand. She has a long-standing history of multiple sclerosis and was followed by Dr. Vergia Alconouglas Jeffries but has not been on Betaseron for several years. She is asking for referral to Dr. Epimenio FootSater to follow her for her multiple sclerosis.  UPDATE 5/1/2019CM Kaitlin [name], with history of stroke in July 2018 with a history of stroke event July 2018 history of MS in the past.  She has been off of Betaseron for 2 years.  She is currently on Plavix and Xarelto for secondary stroke prevention with minimal bruising and no bleeding she is on Lipitor without myalgias.  Blood pressure in the office today 153/66.  She continues to have some right-sided weakness from her stroke however her husband reports that her gait has been more unsteady the last week or so.  She has not had any falls she does not use an assistive device.  She no longer drives.  She has had some memory issues since her stroke and is due for neuropsych evaluation later in the month.  Recent labs primary care office Dr. Felipa EthAvva with elevated liver functions.  These are to be repeated in 2 months.  She returns for reevaluation  UPDATE 11/1/2019CM Kaitlin [name] 73 year old female returns for follow-up with history of stroke in 2018 and history of MS in the past.  She has been off of Betaseron for 2-1/2 years.  She is currently on Plavix and Xarelto for secondary stroke prevention with minimal bruising and no bleeding.  She is also taking Repatha from her  cardiologist.  Blood pressure in the office today better 155/69.  Husband claims lisinopril has  just been added.  Since last seen I ordered an MRI of the brain due to increased difficulty with ambulation.  The MRI was reviewed with Dr. Pearlean BrownieSethi  and did not show any new or acute changes but she did have expected age-related changes .  She has also had neuropsych evaluation since last seen.  This was done by Dr. Kieth Brightlyodenbough.  Who felt that her current changes in memory and expressive language appeared to be primarily related to white matter involvement related to her chronic microvascular ischemia as well as her stroke.  She has had an injection to her left which is been beneficial for her as well as receiving physical therapy.  She is now doing her home exercise.  She returns for reevaluation  REVIEW OF SYSTEMS: Out of a complete 14 system review of symptoms, the patient complains only of the following symptoms, shortness of breath, heat intolerance, snoring, walking difficulty, bruise/bleed easily, anemia, memory loss, speech difficulty, confusion and all other reviewed systems are negative.  ALLERGIES: Allergies  Allergen Reactions   Eliquis [Apixaban] Rash   Penicillins Other (See Comments)  From childhood     HOME MEDICATIONS: Outpatient Medications Prior to Visit  Medication Sig Dispense Refill   Ascorbic Acid (VITAMIN C) 500 MG CAPS Take 500 mg by mouth daily.     Calcium Citrate-Vitamin D (CALCIUM CITRATE + D PO) Take 1 tablet by mouth daily.     cholecalciferol (VITAMIN D) 1000 units tablet Take 3,000 Units by mouth daily.     clopidogrel (PLAVIX) 75 MG tablet Take 1 tablet (75 mg total) by mouth daily. 30 tablet 0   Evolocumab (REPATHA SURECLICK) 140 MG/ML SOAJ Inject 1 pen into the skin every 14 (fourteen) days. 2 pen 11   ferrous sulfate 325 (65 FE) MG tablet Take 325 mg by mouth 2 (two) times daily with a meal.      FLUoxetine (PROZAC) 10 MG capsule Take 1 capsule (10 mg  total) by mouth daily. 30 capsule 0   hydrochlorothiazide (MICROZIDE) 12.5 MG capsule Take 1 capsule (12.5 mg total) by mouth daily. 90 capsule 1   lisinopril (PRINIVIL,ZESTRIL) 2.5 MG tablet TAKE 1 TABLET BY MOUTH EVERY DAY 90 tablet 3   metoprolol tartrate (LOPRESSOR) 50 MG tablet Take 1 tablet (50 mg total) by mouth 2 (two) times daily. 60 tablet 0   Multiple Vitamins-Minerals (PRESERVISION AREDS PO) Take 1 tablet by mouth 2 (two) times daily.     pantoprazole (PROTONIX) 40 MG tablet Take 1 tablet (40 mg total) by mouth daily. 30 tablet 0   Rivaroxaban (XARELTO) 15 MG TABS tablet Take 1 tablet (15 mg total) by mouth daily with supper. 30 tablet 0   No facility-administered medications prior to visit.     PAST MEDICAL HISTORY: Past Medical History:  Diagnosis Date   Abnormality of gait as late effect of stroke 02/23/2017   Acute hypoxemic respiratory failure (HCC)    Anemia    Arthritis    Atrial fibrillation with RVR (HCC) 02/17/2017   CAD S/P percutaneous coronary angioplasty 2001   Colon polyps    CVA (cerebral vascular accident) (HCC) 01/2017   DDD (degenerative disc disease), cervical    Dysphagia as late effect of stroke 02/23/2017   H/O blood clots    HCVD (hypertensive cardiovascular disease) 01/2017   normal LVF with moderate LVH, grade 1 DD   High cholesterol    HTN (hypertension)    Hypokalemia    Labile blood pressure    Leukocytosis    Multiple sclerosis (HCC)    Osteopenia    PAF (paroxysmal atrial fibrillation) (HCC) 01/2017   Right hemiparesis (HCC)     PAST SURGICAL HISTORY: Past Surgical History:  Procedure Laterality Date   CORONARY ANGIOPLASTY WITH STENT PLACEMENT  11/1999   [facility]   IR ANGIO INTRA EXTRACRAN SEL COM CAROTID INNOMINATE UNI R MOD SED  02/15/2017   IR ANGIO VERTEBRAL SEL SUBCLAVIAN INNOMINATE UNI R MOD SED  02/15/2017   IR INTRAVSC STENT CERV CAROTID W/O EMB-PROT MOD SED INC ANGIO  02/15/2017   IR  PERCUTANEOUS ART THROMBECTOMY/INFUSION INTRACRANIAL INC DIAG ANGIO  02/15/2017   IR RADIOLOGIST EVAL & MGMT  05/10/2017   NECK SURGERY     RADIOLOGY WITH ANESTHESIA N/A 02/14/2017   Procedure: RADIOLOGY WITH ANESTHESIA;  Surgeon: Radiologist, Medication, MD;  Location: [facility] OR;  Service: Radiology;  Laterality: N/A;   WRIST SURGERY Left     FAMILY HISTORY: Family History  Problem Relation Age of Onset   Heart disease Father    Cancer Father        type  unknown   Multiple sclerosis Sister    Ovarian cancer Mother    Colon cancer Neg Hx    Stomach cancer Neg Hx    Liver cancer Neg Hx    Pancreatic cancer Neg Hx    Rectal cancer Neg Hx     SOCIAL HISTORY: Social History   Socioeconomic History   Marital status: Married    Spouse name: Not on file   Number of children: 0   Years of education: Not on file   Highest education level: Not on file  Occupational History   Occupation: retired  Ecologist strain: Not on file   Food insecurity    Worry: Not on file    Inability: Not on Occupational hygienist needs    Medical: Not on file    Non-medical: Not on file  Tobacco Use   Smoking status: Former Smoker    Types: Cigarettes    Quit date: 1985    Years since quitting: 35.5   Smokeless tobacco: Never Used  Substance and Sexual Activity   Alcohol use: Yes    Alcohol/week: 3.0 standard drinks    Types: 3 Glasses of wine per week    Comment: wine 1-2 per day   Drug use: No   Sexual activity: Not on file  Lifestyle   Physical activity    Days per week: Not on file    Minutes per session: Not on file   Stress: Not on file  Relationships   Social connections    Talks on phone: Not on file    Gets together: Not on file    Attends religious service: Not on file    Active member of club or organization: Not on file    Attends meetings of clubs or organizations: Not on file    Relationship status: Not on file   Intimate  partner violence    Fear of current or ex partner: Not on file    Emotionally abused: Not on file    Physically abused: Not on file    Forced sexual activity: Not on file  Other Topics Concern   Not on file  Social History Narrative   Not on file      PHYSICAL EXAM  Vitals:   01/31/19 1111  BP: 138/78  Pulse: 64  Temp: 97.7 F (36.5 C)  Weight: 157 lb (71.2 kg)  Height:  (1.575 m)   Body mass index is 28.72 kg/m.  Generalized: Well developed, in no acute distress  Cardiology: normal rate and rhythm, no murmur noted Neurological examination  Mentation: Alert oriented to time, place, history taking. Follows all commands speech and language fluent Cranial nerve II-XII: Pupils were equal round reactive to light. Extraocular movements were full, visual field were full on confrontational test. Facial sensation and strength were normal. Uvula tongue midline. Head turning and shoulder shrug  were normal and symmetric. Motor: The motor testing reveals 5 over 5 strength of all 4 extremities. Good symmetric motor tone is noted throughout.  Sensory: Sensory testing is intact to soft touch on all 4 extremities. No evidence of extinction is noted.  Coordination: Cerebellar testing reveals good finger-nose-finger and heel-to-shin bilaterally.  Gait and station: Gait is normal. Tandem gait is normal. Romberg is negative. No drift is seen.  Reflexes: Deep tendon reflexes are symmetric and normal bilaterally.   DIAGNOSTIC DATA (LABS, IMAGING, TESTING) - I reviewed patient records, labs, notes, testing and imaging myself where available.  No flowsheet data found.   Lab Results  Component Value Date   WBC 8.7 08/21/2018   HGB 10.4 (L) 08/21/2018   HCT 31.6 (L) 08/21/2018   MCV 92 08/21/2018   PLT 271 08/21/2018      Component Value Date/Time   NA 135 08/21/2018 1115   K 5.4 (H) 08/21/2018 1115   CL 97 08/21/2018 1115   CO2 22 08/21/2018 1115   GLUCOSE 100 (H) 08/21/2018  1115   GLUCOSE 113 (H) 06/04/2017 0407   BUN 29 (H) 08/21/2018 1115   CREATININE 1.40 (H) 08/21/2018 1115   CALCIUM 9.9 08/21/2018 1115   PROT 7.0 08/21/2018 1115   ALBUMIN 4.6 08/21/2018 1115   AST 51 (H) 08/21/2018 1115   ALT 61 (H) 08/21/2018 1115   ALKPHOS 73 08/21/2018 1115   BILITOT 0.3 08/21/2018 1115   GFRNONAA 37 (L) 08/21/2018 1115   GFRAA 43 (L) 08/21/2018 1115   Lab Results  Component Value Date   CHOL 173 06/27/2018   HDL 99 06/27/2018   LDLCALC 51 06/27/2018   TRIG 116 06/27/2018   CHOLHDL 1.7 06/27/2018   Lab Results  Component Value Date   HGBA1C 5.2 02/15/2017   No results found for: VITAMINB12 No results found for: TSH     ASSESSMENT AND PLAN 73 y.o. year old female  has a past medical history of Abnormality of gait as late effect of stroke (02/23/2017), Acute hypoxemic respiratory failure ([place]), Anemia, Arthritis, Atrial fibrillation with RVR ([place]) (02/17/2017), CAD S/P percutaneous coronary angioplasty (2001), Colon polyps, CVA (cerebral vascular accident) ([place]) (01/2017), DDD (degenerative disc disease), cervical, Dysphagia as late effect of stroke (02/23/2017), H/O blood clots, HCVD (hypertensive cardiovascular disease) (01/2017), High cholesterol, HTN (hypertension), Hypokalemia, Labile blood pressure, Leukocytosis, Multiple sclerosis ([place]), Osteopenia, PAF (paroxysmal atrial fibrillation) ([place]) (01/2017), and Right hemiparesis ([place]). here with     ICD-10-CM   1. Cerebrovascular accident (CVA) due to thrombosis of precerebral artery ([place])  I63.00   2. Multiple sclerosis ([place])  G35     Kaitlin [name] continues to do well.  She is followed very closely by vascular surgery as well as cardiology.  She continues Plavix and Xarelto as prescribed.  She reports blood pressures are well managed on lisinopril, HCTZ and metoprolol.  She continues healthy lifestyle with heart healthy diet and regular exercise.  She has had no MS symptoms.  We will continue current treatment  plan.  I have advised that she speak with Dr. Estanislado Pandy regarding continuation of Plavix.  Signs and symptoms of stroke reviewed.  Education provided on stroke prevention.  She will continue to monitor for any MS symptoms and call should these occur.  We will follow-up in 6 months.  She and her husband both verbalized understanding and agreement with this plan.   No orders of the defined types were placed in this encounter.    No orders of the defined types were placed in this encounter.     I spent 15 minutes with the patient. 50% of this time was spent counseling and educating patient on plan of care and medications.    Debbora Presto, FNP-C 01/31/2019, 12:03 PM [facility] [address] [place], [place] [address] [phone]

## 2019-01-31 NOTE — Progress Notes (Signed)
I have read the note, and I agree with the clinical assessment and plan.  Arilyn Brierley A. Matthewjames Petrasek, MD, PhD, FAAN Certified in Neurology, Clinical Neurophysiology, Sleep Medicine, Pain Medicine and Neuroimaging  [facility] [address], [address] [place], [place] [address] [phone]  

## 2019-01-31 NOTE — Patient Instructions (Signed)
Continue current treatment regimen.   Follow up with vascular and cardiology as advised  Exercise regularly and eat heart healthy diet   Follow up in 6 months   Stroke Prevention Some medical conditions and lifestyle choices can lead to a higher risk for a stroke. You can help to prevent a stroke by making nutrition, lifestyle, and other changes. What nutrition changes can be made?   Eat healthy foods. ? Choose foods that are high in fiber. These include:  Fresh fruits.  Fresh vegetables.  Whole grains. ? Eat at least 5 or more servings of fruits and vegetables each day. Try to fill half of your plate at each meal with fruits and vegetables. ? Choose lean protein foods. These include:  Lowfat (lean) cuts of meat.  Chicken without skin.  Fish.  Tofu.  Beans.  Nuts. ? Eat low-fat dairy products. ? Avoid foods that:  Are high in salt (sodium).  Have saturated fat.  Have trans fat.  Have cholesterol.  Are processed.  Are premade.  Follow eating guidelines as told by your doctor. These may include: ? Reducing how many calories you eat and drink each day. ? Limiting how much salt you eat or drink each day to 1,500 milligrams (mg). ? Using only healthy fats for cooking. These include:  Olive oil.  Canola oil.  Sunflower oil. ? Counting how many carbohydrates you eat and drink each day. What lifestyle changes can be made?  Try to stay at a healthy weight. Talk to your doctor about what a good weight is for you.  Get at least 30 minutes of moderate physical activity at least 5 days a week. This can include: ? Fast walking. ? Biking. ? Swimming.  Do not use any products that have nicotine or tobacco. This includes cigarettes and e-cigarettes. If you need help quitting, ask your doctor. Avoid being around tobacco smoke in general.  Limit how much alcohol you drink to no more than 1 drink a day for nonpregnant women and 2 drinks a day for men. One drink  equals 12 oz of beer, 5 oz of wine, or 1 oz of hard liquor.  Do not use drugs.  Avoid taking birth control pills. Talk to your doctor about the risks of taking birth control pills if: ? You are over 60 years old. ? You smoke. ? You get migraines. ? You have had a blood clot. What other changes can be made?  Manage your cholesterol. ? It is important to eat a healthy diet. ? If your cholesterol cannot be managed through your diet, you may also need to take medicines. Take medicines as told by your doctor.  Manage your diabetes. ? It is important to eat a healthy diet and to exercise regularly. ? If your blood sugar cannot be managed through diet and exercise, you may need to take medicines. Take medicines as told by your doctor.  Control your high blood pressure (hypertension). ? Try to keep your blood pressure below 130/80. This can help lower your risk of stroke. ? It is important to eat a healthy diet and to exercise regularly. ? If your blood pressure cannot be managed through diet and exercise, you may need to take medicines. Take medicines as told by your doctor. ? Ask your doctor if you should check your blood pressure at home. ? Have your blood pressure checked every year. Do this even if your blood pressure is normal.  Talk to your doctor about getting checked  for a sleep disorder. Signs of this can include: ? Snoring a lot. ? Feeling very tired.  Take over-the-counter and prescription medicines only as told by your doctor. These may include aspirin or blood thinners (antiplatelets or anticoagulants).  Make sure that any other medical conditions you have are managed. Where to find more information  American Stroke Association: www.strokeassociation.org  National Stroke Association: www.stroke.org Get help right away if:  You have any symptoms of stroke. "BE FAST" is an easy way to remember the main warning signs: ? B - Balance. Signs are dizziness, sudden trouble  walking, or loss of balance. ? E - Eyes. Signs are trouble seeing or a sudden change in how you see. ? F - Face. Signs are sudden weakness or loss of feeling of the face, or the face or eyelid drooping on one side. ? A - Arms. Signs are weakness or loss of feeling in an arm. This happens suddenly and usually on one side of the body. ? S - Speech. Signs are sudden trouble speaking, slurred speech, or trouble understanding what people say. ? T - Time. Time to call emergency services. Write down what time symptoms started.  You have other signs of stroke, such as: ? A sudden, very bad headache with no known cause. ? Feeling sick to your stomach (nausea). ? Throwing up (vomiting). ? Jerky movements you cannot control (seizure). These symptoms may represent a serious problem that is an emergency. Do not wait to see if the symptoms will go away. Get medical help right away. Call your local emergency services (911 in the [place] Do not drive yourself to the hospital. Summary  You can prevent a stroke by eating healthy, exercising, not smoking, drinking less alcohol, and treating other health problems, such as diabetes, high blood pressure, or high cholesterol.  Do not use any products that contain nicotine or tobacco, such as cigarettes and e-cigarettes.  Get help right away if you have any signs or symptoms of a stroke. This information is not intended to replace advice given to you by your health care provider. Make sure you discuss any questions you have with your health care provider. Document Released: 01/18/2012 Document Revised: 09/14/2018 Document Reviewed: 10/20/2016 Elsevier Patient Education  2020 [employer].

## 2019-01-31 NOTE — Progress Notes (Signed)
I agree with the above plan 

## 2019-02-02 ENCOUNTER — Other Ambulatory Visit: Payer: Self-pay | Admitting: Cardiology

## 2019-02-02 DIAGNOSIS — R600 Localized edema: Secondary | ICD-10-CM

## 2019-02-02 DIAGNOSIS — R0600 Dyspnea, unspecified: Secondary | ICD-10-CM

## 2019-02-02 DIAGNOSIS — I10 Essential (primary) hypertension: Secondary | ICD-10-CM

## 2019-02-02 DIAGNOSIS — I2510 Atherosclerotic heart disease of native coronary artery without angina pectoris: Secondary | ICD-10-CM

## 2019-02-02 DIAGNOSIS — E782 Mixed hyperlipidemia: Secondary | ICD-10-CM

## 2019-02-02 DIAGNOSIS — Z789 Other specified health status: Secondary | ICD-10-CM

## 2019-02-02 DIAGNOSIS — R0609 Other forms of dyspnea: Secondary | ICD-10-CM

## 2019-02-05 ENCOUNTER — Other Ambulatory Visit: Payer: Self-pay | Admitting: Cardiology

## 2019-02-05 DIAGNOSIS — I2510 Atherosclerotic heart disease of native coronary artery without angina pectoris: Secondary | ICD-10-CM

## 2019-02-05 DIAGNOSIS — E782 Mixed hyperlipidemia: Secondary | ICD-10-CM

## 2019-02-05 DIAGNOSIS — R600 Localized edema: Secondary | ICD-10-CM

## 2019-02-05 DIAGNOSIS — R0600 Dyspnea, unspecified: Secondary | ICD-10-CM

## 2019-02-05 DIAGNOSIS — I10 Essential (primary) hypertension: Secondary | ICD-10-CM

## 2019-02-05 DIAGNOSIS — Z789 Other specified health status: Secondary | ICD-10-CM

## 2019-02-05 DIAGNOSIS — R0609 Other forms of dyspnea: Secondary | ICD-10-CM

## 2019-02-05 MED ORDER — HYDROCHLOROTHIAZIDE 12.5 MG PO CAPS: 12.5000 mg | ORAL_CAPSULE | Freq: Every day | ORAL | 1 refills | Status: DC

## 2019-02-14 DIAGNOSIS — R7989 Other specified abnormal findings of blood chemistry: Secondary | ICD-10-CM | POA: Diagnosis not present

## 2019-02-28 ENCOUNTER — Other Ambulatory Visit: Payer: Self-pay

## 2019-02-28 ENCOUNTER — Ambulatory Visit (HOSPITAL_COMMUNITY)
Admission: RE | Admit: 2019-02-28 | Discharge: 2019-02-28 | Disposition: A | Discharge status: Home / Self Care | Payer: Medicare HMO | Source: Ambulatory Visit | Attending: Interventional Radiology | Admitting: Interventional Radiology

## 2019-02-28 DIAGNOSIS — I771 Stricture of artery: Secondary | ICD-10-CM | POA: Insufficient documentation

## 2019-02-28 NOTE — Progress Notes (Signed)
Carotid artery duplex completed. Refer to "CV Proc" under chart review to view preliminary results.  02/28/2019 1:39 PM Maudry Mayhew, MHA, RVT, RDCS, RDMS

## 2019-03-01 ENCOUNTER — Telehealth (HOSPITAL_COMMUNITY): Payer: Self-pay

## 2019-03-01 NOTE — Telephone Encounter (Signed)
Pt agreed to f/u in 6 months with US carotid. Husband wants to know if she can d/c Plavix. Will forward to our PA and get back with him. AW

## 2019-03-06 ENCOUNTER — Telehealth (HOSPITAL_COMMUNITY): Payer: Self-pay

## 2019-03-06 NOTE — Telephone Encounter (Signed)
Informed pt's husband that we will revisit stopping Brilinta until the next f/u in 6 months. She has all the risk factors for another stroke. He agreed with this plan. AW

## 2019-03-14 DIAGNOSIS — R7989 Other specified abnormal findings of blood chemistry: Secondary | ICD-10-CM | POA: Diagnosis not present

## 2019-03-14 DIAGNOSIS — D649 Anemia, unspecified: Secondary | ICD-10-CM | POA: Diagnosis not present

## 2019-04-14 ENCOUNTER — Other Ambulatory Visit: Payer: Self-pay | Admitting: Cardiology

## 2019-04-16 DIAGNOSIS — I10 Essential (primary) hypertension: Secondary | ICD-10-CM | POA: Diagnosis not present

## 2019-04-19 DIAGNOSIS — J309 Allergic rhinitis, unspecified: Secondary | ICD-10-CM | POA: Diagnosis not present

## 2019-04-19 DIAGNOSIS — I69959 Hemiplegia and hemiparesis following unspecified cerebrovascular disease affecting unspecified side: Secondary | ICD-10-CM | POA: Diagnosis not present

## 2019-04-19 DIAGNOSIS — G35 Multiple sclerosis: Secondary | ICD-10-CM | POA: Diagnosis not present

## 2019-04-19 DIAGNOSIS — D649 Anemia, unspecified: Secondary | ICD-10-CM | POA: Diagnosis not present

## 2019-04-19 DIAGNOSIS — I10 Essential (primary) hypertension: Secondary | ICD-10-CM | POA: Diagnosis not present

## 2019-04-19 DIAGNOSIS — I4891 Unspecified atrial fibrillation: Secondary | ICD-10-CM | POA: Diagnosis not present

## 2019-04-19 DIAGNOSIS — I25119 Atherosclerotic heart disease of native coronary artery with unspecified angina pectoris: Secondary | ICD-10-CM | POA: Diagnosis not present

## 2019-05-04 DIAGNOSIS — Z23 Encounter for immunization: Secondary | ICD-10-CM | POA: Diagnosis not present

## 2019-05-29 ENCOUNTER — Ambulatory Visit: Payer: Medicare HMO | Admitting: Cardiology

## 2019-05-31 ENCOUNTER — Telehealth: Payer: Self-pay | Admitting: *Deleted

## 2019-05-31 ENCOUNTER — Encounter: Payer: Self-pay | Admitting: *Deleted

## 2019-05-31 NOTE — Telephone Encounter (Signed)
Virtual Visit Pre-Appointment Phone Call  "(Name), I am calling you today to discuss your upcoming appointment. We are currently trying to limit exposure to the virus that causes COVID-19 by seeing patients at home rather than in the office."  1. "What is the BEST phone number to call the day of the visit?" - include this in appointment notes-YES UPDATED PTS HUSBAND AND IN NOTES  2. Do you have or have access to (through a family member/friend) a smartphone with video capability that we can use for your visit?"  a. If no - list the appointment type as a PHONE visit in appointment notes-YES UPDATED  3. Confirm consent - "In the setting of the current Covid19 crisis, you are scheduled for a (phone ) visit with DR NELSON ON 10/30 AT 0920 .  Just as we do with many in-office visits, in order for you to participate in this visit, we must obtain consent.  If you'd like, I can send this to your mychart (if signed up) or email for you to review.  Otherwise, I can obtain your verbal consent now.  All virtual visits are billed to your insurance company just like a normal visit would be.  By agreeing to a virtual visit, we'd like you to understand that the technology does not allow for your provider to perform an examination, and thus may limit your provider's ability to fully assess your condition. If your provider identifies any concerns that need to be evaluated in person, we will make arrangements to do so.  Finally, though the technology is pretty good, we cannot assure that it will always work on either your or our end, and in the setting of a video visit, we may have to convert it to a phone-only visit.  In either situation, we cannot ensure that we have a secure connection.  Are you willing to proceed?" STAFF: Did the patient verbally acknowledge consent to telehealth visit? Document YES/NO here: PTS HUSBAND (ON DPR AND SPEAKS FOR THE PT) GAVE VERBAL CONSENT FOR DR NELSON TO TREAT THE PT TOMORROW VIA  TELEPHONE VISIT AT 0920.  4. Advise patient to be prepared - "Two hours prior to your appointment, go ahead and check your blood pressure, pulse, oxygen saturation, and your weight (if you have the equipment to check those) and write them all down. When your visit starts, your provider will ask you for this information. If you have an Apple Watch or Kardia device, please plan to have heart rate information ready on the day of your appointment. Please have a pen and paper handy nearby the day of the visit as well."--YES SPOUSE AWARE TO TAKE BP/HR/WT 5.  6. Inform patient they will receive a phone call 15 minutes prior to their appointment time (may be from unknown caller ID) so they should be prepared to answer-YES AWARE    TELEPHONE CALL NOTE  Kaitlin [name] has been deemed a candidate for a follow-up tele-health visit to limit community exposure during the Covid-19 pandemic. I spoke with the patient via phone to ensure availability of phone/video source, confirm preferred email & phone number, and discuss instructions and expectations.  I reminded Kaitlin [name] to be prepared with any vital sign and/or heart rhythm information that could potentially be obtained via home monitoring, at the time of her visit. I reminded Kaitlin [name] to expect a phone call prior to her visit.  Loa Socks, LPN 96/29/5284 13:24 AM  FULL LENGTH CONSENT FOR TELE-HEALTH VISIT   I hereby voluntarily request, consent and authorize [facility] and its employed or contracted physicians, physician assistants, nurse practitioners or other licensed health care professionals (the Practitioner), to provide me with telemedicine health care services (the Services") as deemed necessary by the treating Practitioner. I acknowledge and consent to receive the Services by the Practitioner via telemedicine. I understand that the telemedicine visit will involve communicating with the Practitioner through live  audiovisual communication technology and the disclosure of certain medical information by electronic transmission. I acknowledge that I have been given the opportunity to request an in-person assessment or other available alternative prior to the telemedicine visit and am voluntarily participating in the telemedicine visit.  I understand that I have the right to withhold or withdraw my consent to the use of telemedicine in the course of my care at any time, without affecting my right to future care or treatment, and that the Practitioner or I may terminate the telemedicine visit at any time. I understand that I have the right to inspect all information obtained and/or recorded in the course of the telemedicine visit and may receive copies of available information for a reasonable fee.  I understand that some of the potential risks of receiving the Services via telemedicine include:   Delay or interruption in medical evaluation due to technological equipment failure or disruption;  Information transmitted may not be sufficient (e.g. poor resolution of images) to allow for appropriate medical decision making by the Practitioner; and/or   In rare instances, security protocols could fail, causing a breach of personal health information.  Furthermore, I acknowledge that it is my responsibility to provide information about my medical history, conditions and care that is complete and accurate to the best of my ability. I acknowledge that Practitioner's advice, recommendations, and/or decision may be based on factors not within their control, such as incomplete or inaccurate data provided by me or distortions of diagnostic images or specimens that may result from electronic transmissions. I understand that the practice of medicine is not an exact science and that Practitioner makes no warranties or guarantees regarding treatment outcomes. I acknowledge that I will receive a copy of this consent concurrently upon  execution via email to the email address I last provided but may also request a printed copy by calling the office of [facility].    I understand that my insurance will be billed for this visit.   I have read or had this consent read to me.  I understand the contents of this consent, which adequately explains the benefits and risks of the Services being provided via telemedicine.   I have been provided ample opportunity to ask questions regarding this consent and the Services and have had my questions answered to my satisfaction.  I give my informed consent for the services to be provided through the use of telemedicine in my medical care  By participating in this telemedicine visit I agree to the above.  PTS HUSBAND GAVE VERBAL CONSENT FOR DR NELSON TO TREAT THE PT VIA TELEPHONE VISIT ON 10/30 AT 0920.  WILL SEND IN MYCHART AS WELL FOR HUSBAND TO REVIEW.

## 2019-06-01 ENCOUNTER — Telehealth (INDEPENDENT_AMBULATORY_CARE_PROVIDER_SITE_OTHER): Payer: Medicare HMO | Admitting: Cardiology

## 2019-06-01 ENCOUNTER — Encounter: Payer: Self-pay | Admitting: Cardiology

## 2019-06-01 ENCOUNTER — Encounter: Payer: Self-pay | Admitting: *Deleted

## 2019-06-01 ENCOUNTER — Other Ambulatory Visit: Payer: Self-pay

## 2019-06-01 VITALS — BP 118/63 | HR 82 | Wt 158.0 lb

## 2019-06-01 DIAGNOSIS — I4891 Unspecified atrial fibrillation: Secondary | ICD-10-CM | POA: Diagnosis not present

## 2019-06-01 DIAGNOSIS — I10 Essential (primary) hypertension: Secondary | ICD-10-CM

## 2019-06-01 DIAGNOSIS — G720 Drug-induced myopathy: Secondary | ICD-10-CM

## 2019-06-01 DIAGNOSIS — Z789 Other specified health status: Secondary | ICD-10-CM

## 2019-06-01 DIAGNOSIS — R600 Localized edema: Secondary | ICD-10-CM

## 2019-06-01 DIAGNOSIS — E782 Mixed hyperlipidemia: Secondary | ICD-10-CM

## 2019-06-01 DIAGNOSIS — I2510 Atherosclerotic heart disease of native coronary artery without angina pectoris: Secondary | ICD-10-CM

## 2019-06-01 DIAGNOSIS — R7401 Elevation of levels of liver transaminase levels: Secondary | ICD-10-CM

## 2019-06-01 DIAGNOSIS — I480 Paroxysmal atrial fibrillation: Secondary | ICD-10-CM | POA: Diagnosis not present

## 2019-06-01 DIAGNOSIS — R0600 Dyspnea, unspecified: Secondary | ICD-10-CM

## 2019-06-01 DIAGNOSIS — R0609 Other forms of dyspnea: Secondary | ICD-10-CM

## 2019-06-01 MED ORDER — METOPROLOL TARTRATE 50 MG PO TABS: 50.0000 mg | ORAL_TABLET | Freq: Two times a day (BID) | ORAL | 4 refills | Status: DC

## 2019-06-01 MED ORDER — HYDROCHLOROTHIAZIDE 12.5 MG PO CAPS: 12.5000 mg | ORAL_CAPSULE | Freq: Every day | ORAL | 2 refills | Status: DC

## 2019-06-01 MED ORDER — RIVAROXABAN 15 MG PO TABS: 15.0000 mg | ORAL_TABLET | Freq: Every day | ORAL | 4 refills | Status: DC

## 2019-06-01 MED ORDER — LISINOPRIL 2.5 MG PO TABS: 2.5000 mg | ORAL_TABLET | Freq: Every day | ORAL | 3 refills | Status: DC

## 2019-06-01 NOTE — Progress Notes (Signed)
Virtual Visit via Telephone Note   This visit type was conducted due to national recommendations for restrictions regarding the COVID-19 Pandemic (e.g. social distancing) in an effort to limit this patient's exposure and mitigate transmission in our community.  Due to her co-morbid illnesses, this patient is at least at moderate risk for complications without adequate follow up.  This format is felt to be most appropriate for this patient at this time.  The patient did not have access to video technology/had technical difficulties with video requiring transitioning to audio format only (telephone).  All issues noted in this document were discussed and addressed.  No physical exam could be performed with this format.  Please refer to the patient's chart for her  consent to telehealth for [facility]   Date:  06/01/2019   ID:  Kaitlin [name], DOB 08-11-1945, MRN [id number]  Patient Location: Home Provider Location: Home  PCP:  Chilton Greathouse, MD  Cardiologist:  Tobias Alexander, MD  Electrophysiologist:  None   Evaluation Performed:  Follow-Up Visit  Chief Complaint:  9 months follow up, dyspnea on exertion  History of Present Illness:    Kaitlin [name] is a 73 y.o. female with h/o CAD, s/p remote RCA PCI 2001, HTN, HLD, and stable multiple sclerosis. She presented to [facility] 02/15/17 with a L MCA occlusion s/p LMCA PTA and LICA PTA/stenting. While on telemetry she had documented PAF and anticoagulation was initiated. She remained on Plavix and Xarelto.CHADSVASC=5. She was maintaining normal sinus rhythm on amiodarone and beta blocker. 2-D echo LVEF 65-70% with mild AS, no cardiac source of emboli. Patient was transferred to cardiac rehabilitation. Patient was discharged from inpatient rehabilitation 03/19/17.  She is taking Repatha for hyperlipidemia.  08/21/2018 -this is 6 months follow-up, the patient is doing better, she is able to walk with no dizziness or falls.  She exercises on a  regular basis.  Her blood pressure has been well controlled, she tolerates her medications well.  She denies any palpitation, she has no bleeding.  06/01/2019-this is 9 months follow-up, overall patient is doing well, she has new very active and when she stands up from prolonged sitting she gets short of breath but not all the time.  Sometimes she can walk without any problems.  She denies any chest pain no palpitation dizziness or fall.  No bleeding.  Lower extremity edema.  The patient does not have symptoms concerning for COVID-19 infection (fever, chills, cough, or new shortness of breath).    Past Medical History:  Diagnosis Date  . Abnormality of gait as late effect of stroke 02/23/2017  . Acute hypoxemic respiratory failure (HCC)   . Anemia   . Arthritis   . Atrial fibrillation with RVR (HCC) 02/17/2017  . CAD S/P percutaneous coronary angioplasty 2001  . Colon polyps   . CVA (cerebral vascular accident) (HCC) 01/2017  . DDD (degenerative disc disease), cervical   . Dysphagia as late effect of stroke 02/23/2017  . H/O blood clots   . HCVD (hypertensive cardiovascular disease) 01/2017   normal LVF with moderate LVH, grade 1 DD  . High cholesterol   . HTN (hypertension)   . Hypokalemia   . Labile blood pressure   . Leukocytosis   . Multiple sclerosis (HCC)   . Osteopenia   . PAF (paroxysmal atrial fibrillation) (HCC) 01/2017  . Right hemiparesis [facility])    Past Surgical History:  Procedure Laterality Date  . CORONARY ANGIOPLASTY WITH STENT PLACEMENT  11/1999  RCA DES-[facility]  . IR ANGIO INTRA EXTRACRAN SEL COM CAROTID INNOMINATE UNI R MOD SED  02/15/2017  . IR ANGIO VERTEBRAL SEL SUBCLAVIAN INNOMINATE UNI R MOD SED  02/15/2017  . IR INTRAVSC STENT CERV CAROTID W/O EMB-PROT MOD SED INC ANGIO  02/15/2017  . IR PERCUTANEOUS ART THROMBECTOMY/INFUSION INTRACRANIAL INC DIAG ANGIO  02/15/2017  . IR RADIOLOGIST EVAL & MGMT  05/10/2017  . NECK SURGERY    . RADIOLOGY WITH ANESTHESIA N/A  02/14/2017   Procedure: RADIOLOGY WITH ANESTHESIA;  Surgeon: Radiologist, Medication, MD;  Location: [place];  Service: Radiology;  Laterality: N/A;  . WRIST SURGERY Left      Current Meds  Medication Sig  . Ascorbic Acid (VITAMIN C) 500 MG CAPS Take 500 mg by mouth daily.  . Calcium Citrate-Vitamin D (CALCIUM CITRATE + D PO) Take 1 tablet by mouth daily.  . cholecalciferol (VITAMIN D) 1000 units tablet Take 3,000 Units by mouth daily.  . clopidogrel (PLAVIX) 75 MG tablet Take 1 tablet (75 mg total) by mouth daily.  . ferrous sulfate 325 (65 FE) MG tablet Take 325 mg by mouth 2 (two) times daily with a meal.   . FLUoxetine (PROZAC) 10 MG capsule Take 1 capsule (10 mg total) by mouth daily.  . hydrochlorothiazide (MICROZIDE) 12.5 MG capsule Take 1 capsule (12.5 mg total) by mouth daily.  Marland Kitchen lisinopril (PRINIVIL,ZESTRIL) 2.5 MG tablet TAKE 1 TABLET BY MOUTH EVERY DAY  . metoprolol tartrate (LOPRESSOR) 50 MG tablet Take 1 tablet (50 mg total) by mouth 2 (two) times daily.  . Multiple Vitamins-Minerals (PRESERVISION AREDS PO) Take 1 tablet by mouth 2 (two) times daily.  . pantoprazole (PROTONIX) 40 MG tablet Take 1 tablet (40 mg total) by mouth daily.  Marland Kitchen REPATHA SURECLICK 213 MG/ML SOAJ INJECT 1 PEN INTO THE SKIN EVERY 14 (FOURTEEN) DAYS.  . Rivaroxaban (XARELTO) 15 MG TABS tablet Take 1 tablet (15 mg total) by mouth daily with supper.     Allergies:   Eliquis [apixaban] and Penicillins   Social History   Tobacco Use  . Smoking status: Former Smoker    Types: Cigarettes    Quit date: 1985    Years since quitting: 35.8  . Smokeless tobacco: Never Used  Substance Use Topics  . Alcohol use: Yes    Alcohol/week: 3.0 standard drinks    Types: 3 Glasses of wine per week    Comment: wine 1-2 per day  . Drug use: No     Family Hx: The patient's family history includes Cancer in her father; Heart disease in her father; Multiple sclerosis in her sister; Ovarian cancer in her mother. There is  no history of Colon cancer, Stomach cancer, Liver cancer, Pancreatic cancer, or Rectal cancer.  ROS:   Please see the history of present illness.    All other systems reviewed and are negative.  Prior CV studies:   The following studies were reviewed today:  Echocardiogram on 02/16/2017  - Left ventricle: The cavity size was normal. There was moderate   concentric hypertrophy. Systolic function was vigorous. The   estimated ejection fraction was in the range of 65% to 70%. Wall   motion was normal; there were no regional wall motion   abnormalities. Doppler parameters are consistent with abnormal   left ventricular relaxation (grade 1 diastolic dysfunction).   Doppler parameters are consistent with elevated ventricular   end-diastolic filling pressure. - Aortic valve: Trileaflet; mildly thickened, mildly calcified   leaflets. There was mild stenosis.  Mean gradient (S): 11 mm Hg.   Peak gradient (S): 17 mm Hg. Valve area (VTI): 1.45 cm^2. Valve   area (Vmax): 1.73 cm^2. Valve area (Vmean): 1.44 cm^2. - Mitral valve: Calcified annulus. Mildly thickened leaflets .   There was trivial regurgitation. - Left atrium: The atrium was normal in size. - Right ventricle: The cavity size was normal. Wall thickness was   normal. Systolic function was normal. - Right atrium: The atrium was normal in size. - Tricuspid valve: There was trivial regurgitation. - Pulmonary arteries: Systolic pressure was within the normal   range. - Inferior vena cava: The vessel was normal in size. - Pericardium, extracardiac: There was no pericardial effusion.  Labs/Other Tests and Data Reviewed:    EKG:  No ECG reviewed.  Recent Labs: 08/21/2018: ALT 61; BUN 29; Creatinine, Ser 1.40; Hemoglobin 10.4; Platelets 271; Potassium 5.4; Sodium 135   Recent Lipid Panel Lab Results  Component Value Date/Time   CHOL 173 06/27/2018 02:09 PM   TRIG 116 06/27/2018 02:09 PM   HDL 99 06/27/2018 02:09 PM   CHOLHDL 1.7  06/27/2018 02:09 PM   CHOLHDL 2.5 02/15/2017 04:20 AM   LDLCALC 51 06/27/2018 02:09 PM    Wt Readings from Last 3 Encounters:  06/01/19 158 lb (71.7 kg)  01/31/19 157 lb (71.2 kg)  12/19/18 156 lb (70.8 kg)     Objective:    Vital Signs:  BP 118/63   Pulse 82   Wt 158 lb (71.7 kg)   BMI 28.90 kg/m    VITAL SIGNS:  reviewed  ASSESSMENT & PLAN:    LMCA infarct:  S/p LMCA PTA and LICA PTA/stenting on 7/17.  She remains on plavix and xarelto (in the setting of afib).  She has had 2 falls in 2018 with subarachnoid hemorrhage but none since then. Most recent carotid ultrasound from 01/2019 showed  Right Carotid: Velocities in the right ICA are consistent with a 1-39% stenosis. Left Carotid: Velocities in the left ICA stent are consistent with a <50% stenosis.  PAF on amiodarone and Xarelto maintaining normal sinus rhythm, we'll continue Xarelto. No bleeding.   Hypertension -well controlled.  Lower extremity edema has improved after adding hydrochlorothiazide.  Lower extremity edema -resolved with adding HCTZ 25 mg po daily  Hyperlipidemia on Repatha.  Tolerated well.  Her lipids have improved significantly now all at goal.  Her LFTs have normalized.  CAD with remote PCI to the RCA -she has dyspnea on exertion, however this might be secondary to deconditioning.  She is advised to start using her stationary bike 30 minutes a day and work it up to 20 minutes a day.  If her symptoms are not improving or getting worse we will perform for stress test.  Her last stress test was done in October 2019 and it was normal.  Elevated LFTs -normalized.  Anemia -while on Plavix and Xarelto, she is being followed by her primary care physician who started her on iron supplements with hemoglobin up to 11.  COVID-19 Education: The signs and symptoms of COVID-19 were discussed with the patient and how to seek care for testing (follow up with PCP or arrange E-visit).  The importance of social distancing  was discussed today.  Time:   Today, I have spent 25 minutes with the patient with telehealth technology discussing the above problems.    Medication Adjustments/Labs and Tests Ordered: Current medicines are reviewed at length with the patient today.  Concerns regarding medicines are outlined above.  Tests Ordered: No orders of the defined types were placed in this encounter.   Medication Changes: No orders of the defined types were placed in this encounter.   Follow Up:  In Person in 6 month(s)  Signed, Tobias Alexander, MD  06/01/2019 9:17 AM    [facility]

## 2019-06-01 NOTE — Patient Instructions (Signed)
Medication Instructions:   Your physician recommends that you continue on your current medications as directed. Please refer to the Current Medication list given to you today.  *If you need a refill on your cardiac medications before your next appointment, please call your pharmacy*    Follow-Up: At [facility], you and your health needs are our priority.  As part of our continuing mission to provide you with exceptional heart care, we have created designated Provider Care Teams.  These Care Teams include your primary Cardiologist (physician) and Advanced Practice Providers (APPs -  Physician Assistants and Nurse Practitioners) who all work together to provide you with the care you need, when you need it.  Your next appointment:   September 19, 2019 AT 9:20 AM  The format for your next appointment:   Virtual Visit   Provider:   Ena Dawley, MD

## 2019-06-01 NOTE — Addendum Note (Signed)
Addended by: Nuala Alpha on: 06/01/2019 09:57 AM   Modules accepted: Orders

## 2019-06-12 NOTE — Telephone Encounter (Signed)
Pts Husband was sending a Pharmacist, community message to ask if I can inform our lipid clinic and Pharmacist  that they received a letter in the mail about the pt needing fasting lipids done, in order to receive her Repatha refill. Husband states that the letter advised that she get her fasting lipids done in December, in order to receive that refill.  Husband wanted to let our Pharmacist in lipid clinic know that he is taking the pt for her yearly check-up to see her PCP on next Monday 11/16, and her PCP will do fasting lipids and have those results sent to our office, if that's ok with our lipid clinic. Informed the pts Husband that I will route this communication to our lipid clinic for further review of this message.  Advised him to take her to her PCP as planned and have her lipids faxed to our office, so that our Pharmacist can proceed with refilling her Repatha. Pts Husband verbalized understanding and agrees with this plan.

## 2019-06-19 DIAGNOSIS — R7989 Other specified abnormal findings of blood chemistry: Secondary | ICD-10-CM | POA: Diagnosis not present

## 2019-06-19 DIAGNOSIS — E7849 Other hyperlipidemia: Secondary | ICD-10-CM | POA: Diagnosis not present

## 2019-06-19 DIAGNOSIS — D649 Anemia, unspecified: Secondary | ICD-10-CM | POA: Diagnosis not present

## 2019-06-19 DIAGNOSIS — I10 Essential (primary) hypertension: Secondary | ICD-10-CM | POA: Diagnosis not present

## 2019-06-22 IMAGING — CT CT CERVICAL SPINE W/O CM
4 of 10 series · 8 of 33 positions shown, 9 images · non-contrast
Comparison: MRI of the head February 15, 2017

CLINICAL DATA: Fell using a walker in bathroom at 7 p.m. No loss of
consciousness. RIGHT periorbital hematoma. History of multiple
sclerosis, stroke, atrial fibrillation.

EXAM:
CT HEAD WITHOUT CONTRAST
CT MAXILLOFACIAL WITHOUT CONTRAST
CT CERVICAL SPINE WITHOUT CONTRAST
TECHNIQUE: Multidetector CT imaging of the head, cervical spine, and
maxillofacial structures were performed using the standard protocol
without intravenous contrast. Multiplanar CT image reconstructions
of the cervical spine and maxillofacial structures were also
generated.

[Series 8: facial/ orbits 2.0 h30s · axial · 0.32mm/px · z∈[-143,-89]mm · 2 of 83 slices shown]
[im 28/83  bone]
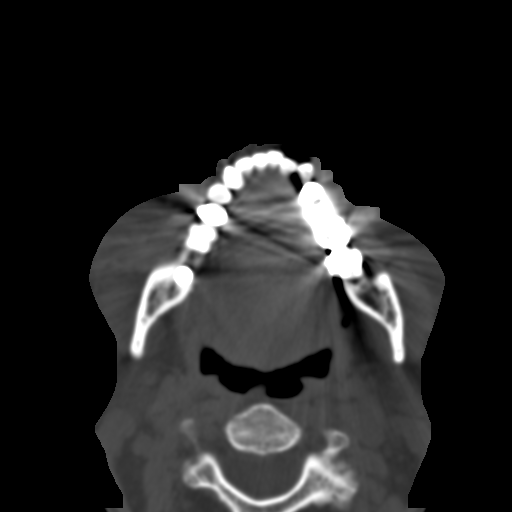
[im 55/83  bone]
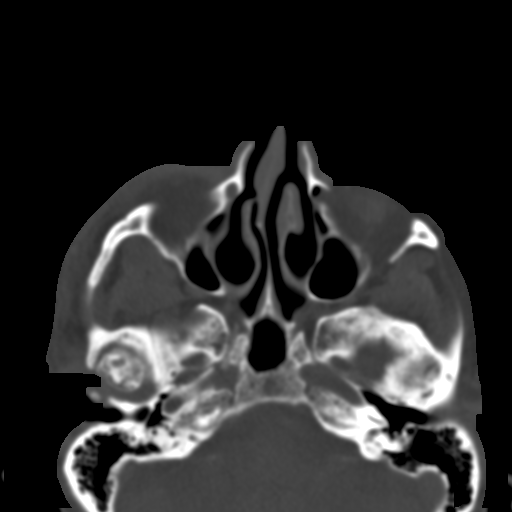

[Series 17: c_spine 2.0 3 st · axial · 0.37mm/px · z∈[-195,-131]mm · 2 of 97 slices shown, 3 images]
[im 33/97  soft-tissue]
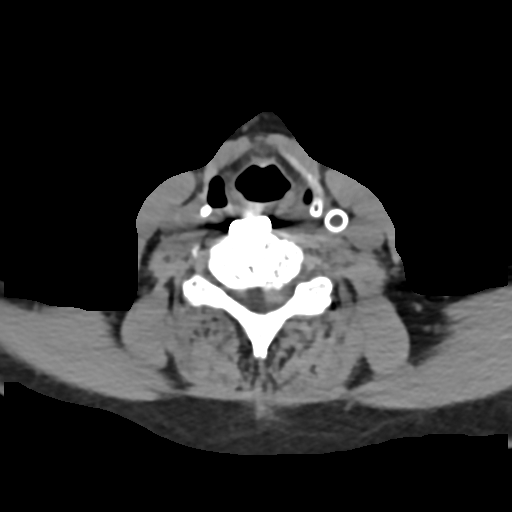
[im 33/97  bone]
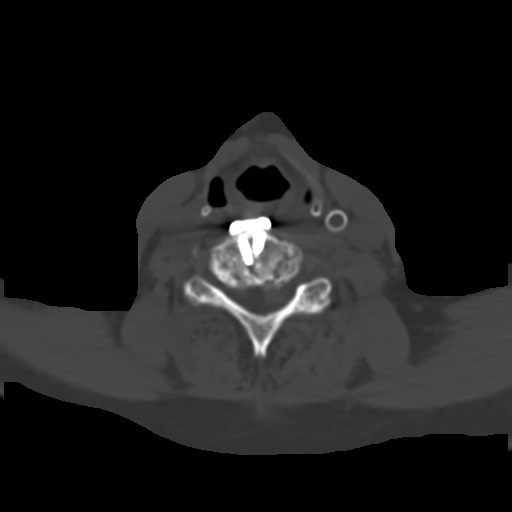
[im 65/97  bone]
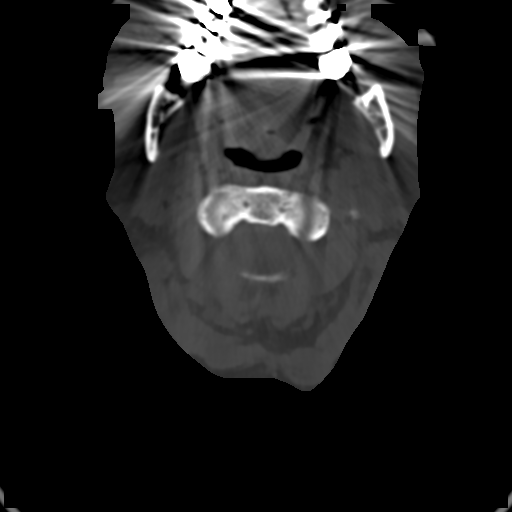

[Series 19: sagittal bone · sagittal · 0.44mm/px · 2 of 61 slices shown]
[im 21/61  bone]
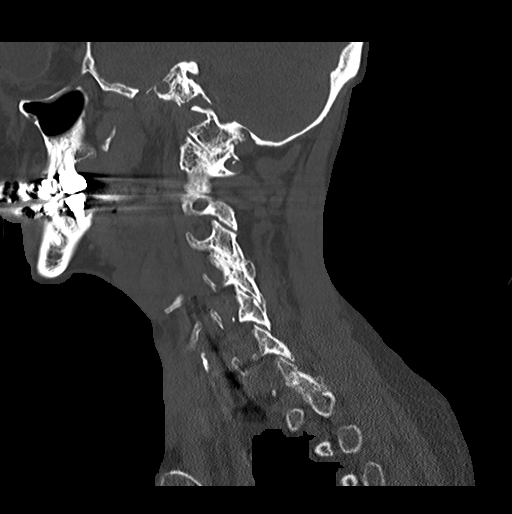
[im 41/61  bone]
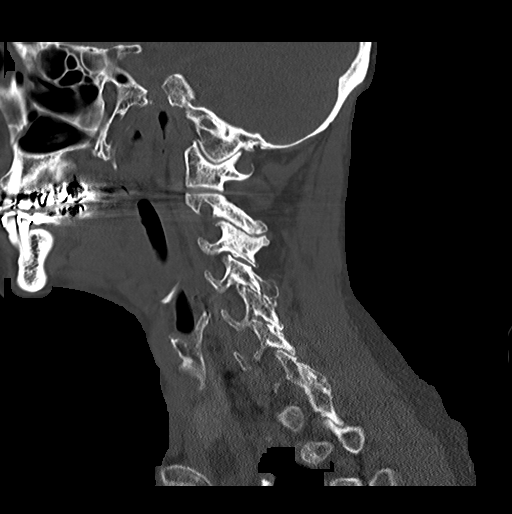

[Series 21: orthogonal axials st · axial · 0.21mm/px · z∈[-230,-165]mm · 2 of 91 slices shown]
[im 31/91  bone]
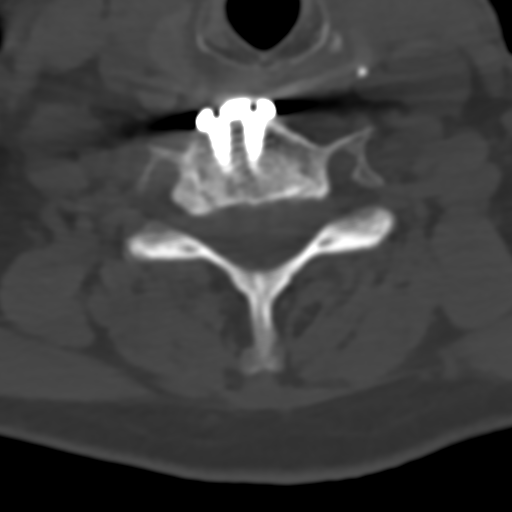
[im 61/91  bone]
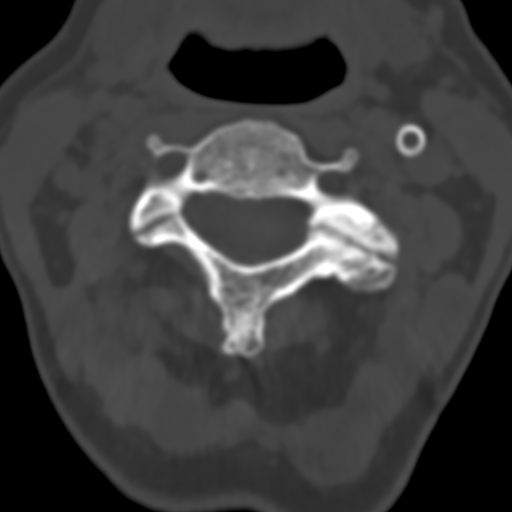

[8 of 33 positions shown; findings below may reference images not displayed]

FINDINGS: CT HEAD FINDINGS

BRAIN: Small volume RIGHT frontal subarachnoid hemorrhage with 4 mm
possible RIGHT frontal lobe hemorrhagic contusion. No mass effect
nor midline shift. Old LEFT basal ganglia infarct with ex vacuo
dilatation LEFT lateral ventricle. No hydrocephalus. Patchy to
confluent supratentorial white matter hypodensities. No acute large
vascular territory infarct. Basal cisterns are patent.

VASCULAR: Moderate calcific atherosclerosis of the carotid siphons.

SKULL: No skull fracture. Large RIGHT frontal scalp hematoma without
subcutaneous gas or radiopaque foreign bodies.

SINUSES/ORBITS: The mastoid air-cells and included paranasal sinuses
are well-aerated.The included ocular globes and orbital contents are
non-suspicious.

OTHER: None.

CT MAXILLOFACIAL FINDINGS

OSSEOUS: The mandible is intact, the condyles are located. Severe
bilateral temporomandibular osteoarthrosis. No acute facial
fracture. No destructive bony lesions.

ORBITS: Ocular globes and orbital contents are nonsuspicious, status
post bilateral ocular lens implants.

SINUSES: Trace paranasal sinus mucosal thickening. Nasal septum is
deviated the RIGHT. Included mastoid aircells are well aerated.

SOFT TISSUES: RIGHT masseter muscle hematoma. Severe RIGHT facial
soft tissue and periorbital swelling with subcutaneous fat
stranding. No focal fluid collections, subcutaneous gas or
radiopaque foreign bodies.

LIMITED INTRACRANIAL: Normal.

CT CERVICAL SPINE FINDINGS

ALIGNMENT: Maintained lordosis. Vertebral bodies in alignment.

SKULL BASE AND VERTEBRAE: Cervical vertebral bodies and posterior
elements are intact. Status post C4 through C7 ACDF with
arthrodesis. C3-4 facets fused on degenerative basis. Mild C7-T1 and
T1-2 disc height loss with endplate spurring compatible with
degenerative disc. C1-2 articulation maintained with severe
arthropathy. Osteopenia.

SOFT TISSUES AND SPINAL CANAL: Nonacute. LEFT carotid artery to
internal carotid artery stent.

DISC LEVELS: No significant osseous canal stenosis. Severe RIGHT
C3-4 neural foraminal narrowing, moderate on the RIGHT at C5-6 and
C6-7.

UPPER CHEST: Biapical pleuroparenchymal scarring. Small RIGHT
pleural effusion.

OTHER: None.
IMPRESSION: CT HEAD:

1. Small volume RIGHT frontal subarachnoid hemorrhage and
subcentimeter suspected RIGHT frontal lobe hemorrhagic contusion.
2. Large RIGHT frontal scalp hematoma.  No skull fracture.
3. Old LEFT basal ganglia infarct.
4. Moderate to severe chronic small vessel ischemic disease.
CT MAXILLOFACIAL:

1. Severe RIGHT facial and periorbital soft tissue
swelling/contusion without postseptal involvement. RIGHT masseter
muscle hematoma.
2. No acute facial fracture.
CT CERVICAL SPINE:

1. No acute fracture.
2. Status post C4 through C7 ACDF with arthrodesis.
3. Small RIGHT pleural effusion.  Recommend chest radiograph.
Acute findings discussed with and reconfirmed by PA.LESLEY LIEVANO on
06/04/2017 at [DATE].

## 2019-07-12 DIAGNOSIS — G720 Drug-induced myopathy: Secondary | ICD-10-CM | POA: Insufficient documentation

## 2019-08-08 ENCOUNTER — Ambulatory Visit: Payer: Medicare HMO | Admitting: Family Medicine

## 2019-08-13 ENCOUNTER — Telehealth: Payer: Self-pay

## 2019-08-13 ENCOUNTER — Other Ambulatory Visit: Payer: Self-pay | Admitting: Cardiology

## 2019-08-13 DIAGNOSIS — E782 Mixed hyperlipidemia: Secondary | ICD-10-CM

## 2019-08-13 DIAGNOSIS — R600 Localized edema: Secondary | ICD-10-CM

## 2019-08-13 DIAGNOSIS — R0600 Dyspnea, unspecified: Secondary | ICD-10-CM

## 2019-08-13 DIAGNOSIS — Z789 Other specified health status: Secondary | ICD-10-CM

## 2019-08-13 DIAGNOSIS — I2510 Atherosclerotic heart disease of native coronary artery without angina pectoris: Secondary | ICD-10-CM

## 2019-08-13 DIAGNOSIS — I10 Essential (primary) hypertension: Secondary | ICD-10-CM

## 2019-08-13 DIAGNOSIS — R0609 Other forms of dyspnea: Secondary | ICD-10-CM

## 2019-08-13 MED ORDER — PRALUENT 150 MG/ML ~~LOC~~ SOAJ: 150.0000 mg | SUBCUTANEOUS | 11 refills | Status: DC

## 2019-08-13 NOTE — Telephone Encounter (Signed)
Called and spoke w/pts husband and informed him that she was approved for praluent and he voiced understanding

## 2019-08-14 NOTE — Telephone Encounter (Signed)
Prescription refill request for Xarelto received.   Last office visit:06/01/2019, Delton See Weight: 71.7 kg Age: 74 y.o. Scr: 1.2,  11/29/2018 CrCl: 46 ml/min   Prescription refill sent.

## 2019-08-28 ENCOUNTER — Telehealth (HOSPITAL_COMMUNITY): Payer: Self-pay

## 2019-08-28 ENCOUNTER — Other Ambulatory Visit (HOSPITAL_COMMUNITY): Payer: Self-pay | Admitting: Interventional Radiology

## 2019-08-28 DIAGNOSIS — I639 Cerebral infarction, unspecified: Secondary | ICD-10-CM

## 2019-08-28 NOTE — Telephone Encounter (Signed)
Called to schedule f/u us carotid, no answer, left vm. AW 

## 2019-08-31 ENCOUNTER — Other Ambulatory Visit: Payer: Self-pay | Admitting: Cardiology

## 2019-08-31 ENCOUNTER — Ambulatory Visit: Payer: Medicare HMO

## 2019-08-31 DIAGNOSIS — I10 Essential (primary) hypertension: Secondary | ICD-10-CM

## 2019-08-31 DIAGNOSIS — E782 Mixed hyperlipidemia: Secondary | ICD-10-CM

## 2019-08-31 DIAGNOSIS — R600 Localized edema: Secondary | ICD-10-CM

## 2019-08-31 DIAGNOSIS — Z789 Other specified health status: Secondary | ICD-10-CM

## 2019-08-31 DIAGNOSIS — R0609 Other forms of dyspnea: Secondary | ICD-10-CM

## 2019-08-31 DIAGNOSIS — I2510 Atherosclerotic heart disease of native coronary artery without angina pectoris: Secondary | ICD-10-CM

## 2019-08-31 DIAGNOSIS — R0600 Dyspnea, unspecified: Secondary | ICD-10-CM

## 2019-09-03 ENCOUNTER — Other Ambulatory Visit (HOSPITAL_COMMUNITY): Payer: Self-pay

## 2019-09-03 ENCOUNTER — Encounter (HOSPITAL_COMMUNITY): Payer: Medicare HMO

## 2019-09-03 DIAGNOSIS — I639 Cerebral infarction, unspecified: Secondary | ICD-10-CM

## 2019-09-06 ENCOUNTER — Telehealth (HOSPITAL_COMMUNITY): Payer: Self-pay

## 2019-09-06 NOTE — Telephone Encounter (Signed)
Called to schedule f/u us carotid, no answer, left vm. AW 

## 2019-09-08 ENCOUNTER — Ambulatory Visit: Payer: Medicare HMO | Attending: Internal Medicine

## 2019-09-08 DIAGNOSIS — Z23 Encounter for immunization: Secondary | ICD-10-CM | POA: Insufficient documentation

## 2019-09-08 NOTE — Progress Notes (Signed)
   Covid-19 Vaccination Clinic  Name:  Kaitlin [name]    MRN: [id number] DOB: 04-08-1946  09/08/2019  [name] was observed post Covid-19 immunization for 15 minutes without incidence. She was provided with Vaccine Information Sheet and instruction to access the V-Safe system.   [name] was instructed to call 911 with any severe reactions post vaccine: Marland Kitchen Difficulty breathing  . Swelling of your face and throat  . A fast heartbeat  . A bad rash all over your body  . Dizziness and weakness    Immunizations Administered    Name Date Dose VIS Date Route   Pfizer COVID-19 Vaccine 09/08/2019  3:34 PM 0.3 mL 07/13/2019 Intramuscular   Manufacturer: [employer], [employer]   Lot: [id number]   NDC: [address]-9

## 2019-09-18 DIAGNOSIS — I25119 Atherosclerotic heart disease of native coronary artery with unspecified angina pectoris: Secondary | ICD-10-CM | POA: Diagnosis not present

## 2019-09-18 DIAGNOSIS — I69959 Hemiplegia and hemiparesis following unspecified cerebrovascular disease affecting unspecified side: Secondary | ICD-10-CM | POA: Diagnosis not present

## 2019-09-18 DIAGNOSIS — J309 Allergic rhinitis, unspecified: Secondary | ICD-10-CM | POA: Diagnosis not present

## 2019-09-18 DIAGNOSIS — D649 Anemia, unspecified: Secondary | ICD-10-CM | POA: Diagnosis not present

## 2019-09-18 DIAGNOSIS — G35 Multiple sclerosis: Secondary | ICD-10-CM | POA: Diagnosis not present

## 2019-09-18 DIAGNOSIS — I10 Essential (primary) hypertension: Secondary | ICD-10-CM | POA: Diagnosis not present

## 2019-09-18 DIAGNOSIS — Z7901 Long term (current) use of anticoagulants: Secondary | ICD-10-CM | POA: Diagnosis not present

## 2019-09-19 ENCOUNTER — Encounter: Payer: Self-pay | Admitting: *Deleted

## 2019-09-19 ENCOUNTER — Encounter: Payer: Self-pay | Admitting: Cardiology

## 2019-09-19 ENCOUNTER — Other Ambulatory Visit: Payer: Self-pay

## 2019-09-19 ENCOUNTER — Telehealth (INDEPENDENT_AMBULATORY_CARE_PROVIDER_SITE_OTHER): Payer: Medicare HMO | Admitting: Cardiology

## 2019-09-19 VITALS — BP 138/75 | HR 90 | Temp 96.9°F | Wt 158.0 lb

## 2019-09-19 DIAGNOSIS — I2510 Atherosclerotic heart disease of native coronary artery without angina pectoris: Secondary | ICD-10-CM | POA: Diagnosis not present

## 2019-09-19 DIAGNOSIS — I480 Paroxysmal atrial fibrillation: Secondary | ICD-10-CM | POA: Diagnosis not present

## 2019-09-19 DIAGNOSIS — G720 Drug-induced myopathy: Secondary | ICD-10-CM | POA: Diagnosis not present

## 2019-09-19 DIAGNOSIS — E782 Mixed hyperlipidemia: Secondary | ICD-10-CM

## 2019-09-19 DIAGNOSIS — I10 Essential (primary) hypertension: Secondary | ICD-10-CM

## 2019-09-19 DIAGNOSIS — Z789 Other specified health status: Secondary | ICD-10-CM

## 2019-09-19 DIAGNOSIS — I4891 Unspecified atrial fibrillation: Secondary | ICD-10-CM

## 2019-09-19 MED ORDER — CLOPIDOGREL BISULFATE 75 MG PO TABS: 75.0000 mg | ORAL_TABLET | Freq: Every day | ORAL | 2 refills | Status: DC

## 2019-09-19 NOTE — Patient Instructions (Signed)
Medication Instructions:   Your physician recommends that you continue on your current medications as directed. Please refer to the Current Medication list given to you today.  *If you need a refill on your cardiac medications before your next appointment, please call your pharmacy*   Follow-Up:  IN 4 MONTHS WITH DR. Delton See IN THE OFFICE ON Thursday 01/17/20 AT 9:20 AM--THIS VISIT WILL BE IN PERSON

## 2019-09-19 NOTE — Progress Notes (Signed)
Virtual Visit via Telephone Note   This visit type was conducted due to national recommendations for restrictions regarding the COVID-19 Pandemic (e.g. social distancing) in an effort to limit this patient's exposure and mitigate transmission in our community.  Due to her co-morbid illnesses, this patient is at least at moderate risk for complications without adequate follow up.  This format is felt to be most appropriate for this patient at this time.  The patient did not have access to video technology/had technical difficulties with video requiring transitioning to audio format only (telephone).  All issues noted in this document were discussed and addressed.  No physical exam could be performed with this format.  Please refer to the patient's chart for her  consent to telehealth for [facility].   Date:  09/19/2019   ID:  Kaitlin [name], DOB 03/31/1946, MRN [id number]  Patient Location: Home Provider Location: Home  PCP:  Prince Solian, MD  Cardiologist:  Ena Dawley, MD  Electrophysiologist:  None   Evaluation Performed:  Follow-Up Visit  Chief Complaint:  3 months follow up  History of Present Illness:    Kaitlin [name] is a 74 y.o. female with h/o CAD, s/p remote RCA PCI 2001, HTN, HLD, and stable multiple sclerosis. She presented to [facility] 02/15/17 with a L MCA occlusion s/p LMCA PTA and LICA PTA/stenting. While on telemetry she had documented PAF and anticoagulation was initiated. She remained on Plavix and Xarelto.CHADSVASC=5. She was maintaining normal sinus rhythm on amiodarone and beta blocker. 2-D echo LVEF 65-70% with mild AS, no cardiac source of emboli. Patient was transferred to cardiac rehabilitation. She is taking Repatha for hyperlipidemia.  09/19/19 - this is a 3 months follow up, she has been doing well, walks with a walker with mild orthostatic hypotension but no falls. She continues to be on Xarelto/Plavix with occasional nose bleeds. Hb is followed closely  by Dr Dagmar Hait, the last one 11.9. She denies chest pain and only has mild DOE. Repatha was switched to Praluent by insurance. She tolerates it well.  The patient does not have symptoms concerning for COVID-19 infection (fever, chills, cough, or new shortness of breath).   Past Medical History:  Diagnosis Date  . Abnormality of gait as late effect of stroke 02/23/2017  . Acute hypoxemic respiratory failure ([place])   . Anemia   . Arthritis   . Atrial fibrillation with RVR ([place]) 02/17/2017  . CAD S/P percutaneous coronary angioplasty 2001  . Colon polyps   . CVA (cerebral vascular accident) ([place]) 01/2017  . DDD (degenerative disc disease), cervical   . Dysphagia as late effect of stroke 02/23/2017  . H/O blood clots   . HCVD (hypertensive cardiovascular disease) 01/2017   normal LVF with moderate LVH, grade 1 DD  . High cholesterol   . HTN (hypertension)   . Hypokalemia   . Labile blood pressure   . Leukocytosis   . Multiple sclerosis ([place])   . Osteopenia   . PAF (paroxysmal atrial fibrillation) ([place]) 01/2017  . Right hemiparesis [facility])    Past Surgical History:  Procedure Laterality Date  . CORONARY ANGIOPLASTY WITH STENT PLACEMENT  11/1999   RCA DES-[facility]  . IR ANGIO INTRA EXTRACRAN SEL COM CAROTID INNOMINATE UNI R MOD SED  02/15/2017  . IR ANGIO VERTEBRAL SEL SUBCLAVIAN INNOMINATE UNI R MOD SED  02/15/2017  . IR INTRAVSC STENT CERV CAROTID W/O EMB-PROT MOD SED INC ANGIO  02/15/2017  . IR PERCUTANEOUS ART THROMBECTOMY/INFUSION INTRACRANIAL INC DIAG  ANGIO  02/15/2017  . IR RADIOLOGIST EVAL & MGMT  05/10/2017  . NECK SURGERY    . RADIOLOGY WITH ANESTHESIA N/A 02/14/2017   Procedure: RADIOLOGY WITH ANESTHESIA;  Surgeon: Radiologist, Medication, MD;  Location: [facility] OR;  Service: Radiology;  Laterality: N/A;  . WRIST SURGERY Left     Current Meds  Medication Sig  . clopidogrel (PLAVIX) 75 MG tablet Take 1 tablet (75 mg total) by mouth daily.    Allergies:   Eliquis [apixaban] and  Penicillins   Social History   Tobacco Use  . Smoking status: Former Smoker    Types: Cigarettes    Quit date: 1985    Years since quitting: 36.1  . Smokeless tobacco: Never Used  Substance Use Topics  . Alcohol use: Yes    Alcohol/week: 3.0 standard drinks    Types: 3 Glasses of wine per week    Comment: wine 1-2 per day  . Drug use: No    Family Hx: The patient's family history includes Cancer in her father; Heart disease in her father; Multiple sclerosis in her sister; Ovarian cancer in her mother. There is no history of Colon cancer, Stomach cancer, Liver cancer, Pancreatic cancer, or Rectal cancer.  ROS:   Please see the history of present illness.    All other systems reviewed and are negative.  Prior CV studies:   The following studies were reviewed today:  Echocardiogram on 02/16/2017  - Left ventricle: The cavity size was normal. There was moderate   concentric hypertrophy. Systolic function was vigorous. The   estimated ejection fraction was in the range of 65% to 70%. Wall   motion was normal; there were no regional wall motion   abnormalities. Doppler parameters are consistent with abnormal   left ventricular relaxation (grade 1 diastolic dysfunction).   Doppler parameters are consistent with elevated ventricular   end-diastolic filling pressure. - Aortic valve: Trileaflet; mildly thickened, mildly calcified   leaflets. There was mild stenosis. Mean gradient (S): 11 mm Hg.   Peak gradient (S): 17 mm Hg. Valve area (VTI): 1.45 cm^2. Valve   area (Vmax): 1.73 cm^2. Valve area (Vmean): 1.44 cm^2. - Mitral valve: Calcified annulus. Mildly thickened leaflets .   There was trivial regurgitation. - Left atrium: The atrium was normal in size. - Right ventricle: The cavity size was normal. Wall thickness was   normal. Systolic function was normal. - Right atrium: The atrium was normal in size. - Tricuspid valve: There was trivial regurgitation. - Pulmonary arteries:  Systolic pressure was within the normal   range. - Inferior vena cava: The vessel was normal in size. - Pericardium, extracardiac: There was no pericardial effusion.  Labs/Other Tests and Data Reviewed:    EKG:  No ECG reviewed.  Recent Labs: No results found for requested labs within last 8760 hours.   Recent Lipid Panel Lab Results  Component Value Date/Time   CHOL 173 06/27/2018 02:09 PM   TRIG 116 06/27/2018 02:09 PM   HDL 99 06/27/2018 02:09 PM   CHOLHDL 1.7 06/27/2018 02:09 PM   CHOLHDL 2.5 02/15/2017 04:20 AM   LDLCALC 51 06/27/2018 02:09 PM    Wt Readings from Last 3 Encounters:  09/19/19 158 lb (71.7 kg)  06/01/19 158 lb (71.7 kg)  01/31/19 157 lb (71.2 kg)     Objective:    Vital Signs:  BP 138/75   Pulse 90   Temp (!) 96.9 F (36.1 C)   Wt 158 lb (71.7 kg)  BMI 28.90 kg/m    VITAL SIGNS:  reviewed   ASSESSMENT & PLAN:    LMCA infarct:  S/p LMCA PTA and LICA PTA/stenting on 7/17.  She remains on plavix and xarelto (in the setting of afib).  She has had 2 falls in 2018 with subarachnoid hemorrhage but none since then. Most recent carotid ultrasound from 01/2019 showed  Right Carotid: Velocities in the right ICA are consistent with a 1-39% stenosis. Left Carotid: Velocities in the left ICA stent are consistent with a <50% stenosis.  PAF - remains in SR, now off amiodarone, on Xarelto, Hb 11.9.   Hypertension -well controlled.  Lower extremity edema has improved after adding hydrochlorothiazide.  Lower extremity edema -resolved with adding HCTZ 25 mg po daily  Hyperlipidemia on Praluent, lipids all at goal in 11/2018.   CAD with remote PCI to the RCA - Her last stress test was done in October 2019 and it was normal. She has minimal DOE.   Elevated LFTs -normalized.  Anemia -while on Plavix and Xarelto, she is being followed by her primary care physician who started her on iron supplements with hemoglobin up to 11.9.  COVID-19 Education: The signs  and symptoms of COVID-19 were discussed with the patient and how to seek care for testing (follow up with PCP or arrange E-visit).  The importance of social distancing was discussed today.  Time:   Today, I have spent 25 minutes with the patient with telehealth technology discussing the above problems.    Medication Adjustments/Labs and Tests Ordered: Current medicines are reviewed at length with the patient today.  Concerns regarding medicines are outlined above.   Tests Ordered: No orders of the defined types were placed in this encounter.  Medication Changes: No orders of the defined types were placed in this encounter.  Follow Up:  In Person in 4 month(s)  Signed, Tobias Alexander, MD  09/19/2019 9:46 AM    [facility]

## 2019-09-21 ENCOUNTER — Ambulatory Visit: Payer: Medicare HMO

## 2019-10-03 ENCOUNTER — Ambulatory Visit: Payer: Medicare HMO | Attending: Internal Medicine

## 2019-10-03 DIAGNOSIS — Z23 Encounter for immunization: Secondary | ICD-10-CM

## 2019-10-03 NOTE — Progress Notes (Signed)
   Covid-19 Vaccination Clinic  Name:  Kaitlin [name]    MRN: [id number] DOB: 07-01-1946  10/03/2019  [name] was observed post Covid-19 immunization for 15 minutes without incident. She was provided with Vaccine Information Sheet and instruction to access the V-Safe system.   [name] was instructed to call 911 with any severe reactions post vaccine: Marland Kitchen Difficulty breathing  . Swelling of face and throat  . A fast heartbeat  . A bad rash all over body  . Dizziness and weakness   Immunizations Administered    Name Date Dose VIS Date Route   Pfizer COVID-19 Vaccine 10/03/2019  2:46 PM 0.3 mL 07/13/2019 Intramuscular   Manufacturer: [employer], [employer]   Lot: [id number]   NDC: [address]-7

## 2019-11-06 DIAGNOSIS — R69 Illness, unspecified: Secondary | ICD-10-CM | POA: Diagnosis not present

## 2019-11-07 ENCOUNTER — Telehealth (HOSPITAL_COMMUNITY): Payer: Self-pay

## 2019-11-07 NOTE — Telephone Encounter (Signed)
Called to schedule us carotid, no answer, left vm. AW  

## 2019-11-27 ENCOUNTER — Other Ambulatory Visit: Payer: Self-pay | Admitting: Cardiology

## 2019-12-11 DIAGNOSIS — Z0000 Encounter for general adult medical examination without abnormal findings: Secondary | ICD-10-CM | POA: Diagnosis not present

## 2019-12-11 DIAGNOSIS — E7849 Other hyperlipidemia: Secondary | ICD-10-CM | POA: Diagnosis not present

## 2019-12-11 DIAGNOSIS — E038 Other specified hypothyroidism: Secondary | ICD-10-CM | POA: Diagnosis not present

## 2019-12-18 DIAGNOSIS — R69 Illness, unspecified: Secondary | ICD-10-CM | POA: Diagnosis not present

## 2019-12-18 DIAGNOSIS — I4891 Unspecified atrial fibrillation: Secondary | ICD-10-CM | POA: Diagnosis not present

## 2019-12-18 DIAGNOSIS — Z0000 Encounter for general adult medical examination without abnormal findings: Secondary | ICD-10-CM | POA: Diagnosis not present

## 2019-12-18 DIAGNOSIS — Z7901 Long term (current) use of anticoagulants: Secondary | ICD-10-CM | POA: Diagnosis not present

## 2019-12-18 DIAGNOSIS — I10 Essential (primary) hypertension: Secondary | ICD-10-CM | POA: Diagnosis not present

## 2019-12-18 DIAGNOSIS — I69959 Hemiplegia and hemiparesis following unspecified cerebrovascular disease affecting unspecified side: Secondary | ICD-10-CM | POA: Diagnosis not present

## 2019-12-18 DIAGNOSIS — R413 Other amnesia: Secondary | ICD-10-CM | POA: Diagnosis not present

## 2019-12-18 DIAGNOSIS — I25119 Atherosclerotic heart disease of native coronary artery with unspecified angina pectoris: Secondary | ICD-10-CM | POA: Diagnosis not present

## 2019-12-18 DIAGNOSIS — Z1331 Encounter for screening for depression: Secondary | ICD-10-CM | POA: Diagnosis not present

## 2019-12-18 DIAGNOSIS — D649 Anemia, unspecified: Secondary | ICD-10-CM | POA: Diagnosis not present

## 2019-12-18 DIAGNOSIS — G35 Multiple sclerosis: Secondary | ICD-10-CM | POA: Diagnosis not present

## 2020-01-12 ENCOUNTER — Other Ambulatory Visit: Payer: Self-pay | Admitting: Cardiology

## 2020-01-14 NOTE — Telephone Encounter (Signed)
Pt is suppose to be taking HCTZ 25 mg po daily.  This dose change was made by Dr. Delton See on 05/2018.  Not sure why the 12.5 mg po daily was sent in.  She should be taking HCTZ 25 mg po daily.

## 2020-01-15 ENCOUNTER — Telehealth: Payer: Self-pay | Admitting: Cardiology

## 2020-01-15 NOTE — Telephone Encounter (Signed)
[name] is calling requesting to attend Kaitlin [name]'s upcoming appointment scheduled for 01/17/20 due to her needing assistance walking as well as having memory issues. Please advise.

## 2020-01-15 NOTE — Telephone Encounter (Signed)
Informed patient's husband he may accompany her to OV. He was grateful for assistance.

## 2020-01-17 ENCOUNTER — Encounter: Payer: Self-pay | Admitting: Cardiology

## 2020-01-17 ENCOUNTER — Ambulatory Visit: Payer: Medicare HMO | Admitting: Cardiology

## 2020-01-17 ENCOUNTER — Other Ambulatory Visit: Payer: Self-pay

## 2020-01-17 VITALS — BP 130/68 | HR 71 | Ht 62.0 in | Wt 157.2 lb

## 2020-01-17 DIAGNOSIS — I350 Nonrheumatic aortic (valve) stenosis: Secondary | ICD-10-CM

## 2020-01-17 NOTE — Progress Notes (Signed)
ekg 

## 2020-01-17 NOTE — Patient Instructions (Signed)
Medication Instructions:   Your physician recommends that you continue on your current medications as directed. Please refer to the Current Medication list given to you today.  *If you need a refill on your cardiac medications before your next appointment, please call your pharmacy*   Testing/Procedures:  Your physician has requested that you have an echocardiogram. Echocardiography is a painless test that uses sound waves to create images of your heart. It provides your doctor with information about the size and shape of your heart and how well your heart's chambers and valves are working. This procedure takes approximately one hour. There are no restrictions for this procedure.   Follow-Up: At [facility], you and your health needs are our priority.  As part of our continuing mission to provide you with exceptional heart care, we have created designated Provider Care Teams.  These Care Teams include your primary Cardiologist (physician) and Advanced Practice Providers (APPs -  Physician Assistants and Nurse Practitioners) who all work together to provide you with the care you need, when you need it.  We recommend signing up for the patient portal called "MyChart".  Sign up information is provided on this After Visit Summary.  MyChart is used to connect with patients for Virtual Visits (Telemedicine).  Patients are able to view lab/test results, encounter notes, upcoming appointments, etc.  Non-urgent messages can be sent to your provider as well.   To learn more about what you can do with MyChart, go to [email or web address].    Your next appointment:   6 month(s)  The format for your next appointment:   In Person  Provider:   Katarina Nelson, MD    

## 2020-01-17 NOTE — Progress Notes (Signed)
Virtual Visit via Telephone Note   Date:  01/17/2020   ID:  Kaitlin [name], DOB August 05, 1945, MRN [id number]  Patient Location: Home Provider Location: Home  PCP:  Prince Solian, MD  Cardiologist:  Ena Dawley, MD  Electrophysiologist:  None   Evaluation Performed:  Follow-Up Visit  Chief Complaint:  4 months follow up  History of Present Illness:    Kaitlin [name] is a 74 y.o. female with h/o CAD, s/p remote RCA PCI 2001, HTN, HLD, and stable multiple sclerosis. She presented to [facility] 02/15/17 with a L MCA occlusion s/p LMCA PTA and LICA PTA/stenting. While on telemetry she had documented PAF and anticoagulation was initiated. She remained on Plavix and Xarelto.CHADSVASC=5. She was maintaining normal sinus rhythm on amiodarone and beta blocker. 2-D echo LVEF 65-70% with mild AS, no cardiac source of emboli. Patient was transferred to cardiac rehabilitation. She is taking Repatha for hyperlipidemia.  09/19/19 - this is a 3 months follow up, she has been doing well, walks with a walker with mild orthostatic hypotension but no falls. She continues to be on Xarelto/Plavix with occasional nose bleeds. Hb is followed closely by Dr Dagmar Hait, the last one 11.9. She denies chest pain and only has mild DOE. Repatha was switched to Praluent by insurance. She tolerates it well.  01/17/2020 -the patient is coming after 4 months, she has been doing well, she uses stationary bike without symptoms of chest pain or shortness of breath.  She walks with a walker and occasionally trips.  No syncope.  No palpitations.  She has been tolerating her medications well, denies any bleeding, she tolerates Praluent well.  Her lipids have improved significantly.  No lower extremity edema orthopnea or proximal nocturnal dyspnea.  Past Medical History:  Diagnosis Date  . Abnormality of gait as late effect of stroke 02/23/2017  . Acute hypoxemic respiratory failure ([place])   . Anemia   . Arthritis   . Atrial  fibrillation with RVR ([place]) 02/17/2017  . CAD S/P percutaneous coronary angioplasty 2001  . Colon polyps   . CVA (cerebral vascular accident) ([place]) 01/2017  . DDD (degenerative disc disease), cervical   . Dysphagia as late effect of stroke 02/23/2017  . H/O blood clots   . HCVD (hypertensive cardiovascular disease) 01/2017   normal LVF with moderate LVH, grade 1 DD  . High cholesterol   . HTN (hypertension)   . Hypokalemia   . Labile blood pressure   . Leukocytosis   . Multiple sclerosis ([place])   . Osteopenia   . PAF (paroxysmal atrial fibrillation) ([place]) 01/2017  . Right hemiparesis [facility])    Past Surgical History:  Procedure Laterality Date  . CORONARY ANGIOPLASTY WITH STENT PLACEMENT  11/1999   RCA DES-[facility]  . IR ANGIO INTRA EXTRACRAN SEL COM CAROTID INNOMINATE UNI R MOD SED  02/15/2017  . IR ANGIO VERTEBRAL SEL SUBCLAVIAN INNOMINATE UNI R MOD SED  02/15/2017  . IR INTRAVSC STENT CERV CAROTID W/O EMB-PROT MOD SED INC ANGIO  02/15/2017  . IR PERCUTANEOUS ART THROMBECTOMY/INFUSION INTRACRANIAL INC DIAG ANGIO  02/15/2017  . IR RADIOLOGIST EVAL & MGMT  05/10/2017  . NECK SURGERY    . RADIOLOGY WITH ANESTHESIA N/A 02/14/2017   Procedure: RADIOLOGY WITH ANESTHESIA;  Surgeon: Radiologist, Medication, MD;  Location: [place];  Service: Radiology;  Laterality: N/A;  . WRIST SURGERY Left     Current Meds  Medication Sig  . Alirocumab (PRALUENT) 150 MG/ML SOAJ Inject 150 mg into the skin every  14 (fourteen) days.  . Ascorbic Acid (VITAMIN C) 500 MG CAPS Take 500 mg by mouth daily.  . Calcium Citrate-Vitamin D (CALCIUM CITRATE + D PO) Take 1 tablet by mouth daily.  . cholecalciferol (VITAMIN D) 1000 units tablet Take 3,000 Units by mouth daily.  . clopidogrel (PLAVIX) 75 MG tablet Take 1 tablet (75 mg total) by mouth daily.  . ferrous sulfate 325 (65 FE) MG tablet Take 325 mg by mouth 2 (two) times daily with a meal.   . FLUoxetine (PROZAC) 10 MG capsule Take 1 capsule (10 mg total) by mouth  daily.  . hydrochlorothiazide (MICROZIDE) 12.5 MG capsule TAKE 1 CAPSULE BY MOUTH EVERY DAY  . lisinopril (ZESTRIL) 2.5 MG tablet Take 1 tablet (2.5 mg total) by mouth daily.  . metoprolol tartrate (LOPRESSOR) 50 MG tablet TAKE 1 TABLET BY MOUTH TWICE A DAY  . Multiple Vitamins-Minerals (PRESERVISION AREDS PO) Take 1 tablet by mouth 2 (two) times daily.  . pantoprazole (PROTONIX) 40 MG tablet Take 1 tablet (40 mg total) by mouth daily.  Carlena Hurl 15 MG TABS tablet TAKE 1 TABLET (15 MG TOTAL) BY MOUTH DAILY WITH SUPPER.    Allergies:   Eliquis [apixaban] and Penicillins   Social History   Tobacco Use  . Smoking status: Former Smoker    Types: Cigarettes    Quit date: 1985    Years since quitting: 36.4  . Smokeless tobacco: Never Used  Vaping Use  . Vaping Use: Never used  Substance Use Topics  . Alcohol use: Yes    Alcohol/week: 3.0 standard drinks    Types: 3 Glasses of wine per week    Comment: wine 1-2 per day  . Drug use: No    Family Hx: The patient's family history includes Cancer in her father; Heart disease in her father; Multiple sclerosis in her sister; Ovarian cancer in her mother. There is no history of Colon cancer, Stomach cancer, Liver cancer, Pancreatic cancer, or Rectal cancer.  ROS:   Please see the history of present illness.    All other systems reviewed and are negative.  Prior CV studies:   The following studies were reviewed today:  Echocardiogram on 02/16/2017  - Left ventricle: The cavity size was normal. There was moderate   concentric hypertrophy. Systolic function was vigorous. The   estimated ejection fraction was in the range of 65% to 70%. Wall   motion was normal; there were no regional wall motion   abnormalities. Doppler parameters are consistent with abnormal   left ventricular relaxation (grade 1 diastolic dysfunction).   Doppler parameters are consistent with elevated ventricular   end-diastolic filling pressure. - Aortic valve:  Trileaflet; mildly thickened, mildly calcified   leaflets. There was mild stenosis. Mean gradient (S): 11 mm Hg.   Peak gradient (S): 17 mm Hg. Valve area (VTI): 1.45 cm^2. Valve   area (Vmax): 1.73 cm^2. Valve area (Vmean): 1.44 cm^2. - Mitral valve: Calcified annulus. Mildly thickened leaflets .   There was trivial regurgitation. - Left atrium: The atrium was normal in size. - Right ventricle: The cavity size was normal. Wall thickness was   normal. Systolic function was normal. - Right atrium: The atrium was normal in size. - Tricuspid valve: There was trivial regurgitation. - Pulmonary arteries: Systolic pressure was within the normal   range. - Inferior vena cava: The vessel was normal in size. - Pericardium, extracardiac: There was no pericardial effusion.  Labs/Other Tests and Data Reviewed:    EKG:  No ECG reviewed.  Recent Labs: No results found for requested labs within last 8760 hours.   Recent Lipid Panel Lab Results  Component Value Date/Time   CHOL 173 06/27/2018 02:09 PM   TRIG 116 06/27/2018 02:09 PM   HDL 99 06/27/2018 02:09 PM   CHOLHDL 1.7 06/27/2018 02:09 PM   CHOLHDL 2.5 02/15/2017 04:20 AM   LDLCALC 51 06/27/2018 02:09 PM    Wt Readings from Last 3 Encounters:  01/17/20 157 lb 3.2 oz (71.3 kg)  09/19/19 158 lb (71.7 kg)  06/01/19 158 lb (71.7 kg)     Objective:    Vital Signs:  BP 130/68   Pulse 71   Ht 5\' 2"  (1.575 m)   Wt 157 lb 3.2 oz (71.3 kg)   SpO2 95%   BMI 28.75 kg/m    VITAL SIGNS:  reviewed   ASSESSMENT & PLAN:    LMCA infarct:  S/p LMCA PTA and LICA PTA/stenting on 7/17.  She remains on plavix and xarelto (in the setting of afib).  She has had 2 falls in 2018 with subarachnoid hemorrhage but none since then. Most recent carotid ultrasound from 01/2019 showed  Right Carotid: Velocities in the right ICA are consistent with a 1-39% stenosis. Left Carotid: Velocities in the left ICA stent are consistent with a <50% stenosis.  PAF -  remains in SR, now off amiodarone, on Xarelto, Hb 10.4.  Hypertension -well controlled.  Lower extremity edema has resolved after adding hydrochlorothiazide.  Lower extremity edema -resolved with adding HCTZ 25 mg po daily  Hyperlipidemia on Praluent, lipids all at goal, triglycerides 127, LDL 57, HDL 77, hemoglobin A1c 5.2%.  CAD with remote PCI to the RCA - Her last stress test was done in October 2019 and it was normal. She is asymptomatic.   Elevated LFTs -normalized.  Anemia -while on Plavix and Xarelto, she is being followed by her primary care physician who started her on iron supplements with hemoglobin up to 10.4.  Mild aortic stenosis on echocardiogram in 2018, we will repeat.  COVID-19 Education: The signs and symptoms of COVID-19 were discussed with the patient and how to seek care for testing (follow up with PCP or arrange E-visit).  The importance of social distancing was discussed today.  Time:   Today, I have spent 25 minutes with the patient with telehealth technology discussing the above problems.    Medication Adjustments/Labs and Tests Ordered: Current medicines are reviewed at length with the patient today.  Concerns regarding medicines are outlined above.   Tests Ordered: No orders of the defined types were placed in this encounter.  Medication Changes: No orders of the defined types were placed in this encounter.  Follow Up:  In Person in 4 month(s)  Signed, 2019, MD  01/17/2020 10:04 AM    [facility]

## 2020-01-25 DIAGNOSIS — Z1231 Encounter for screening mammogram for malignant neoplasm of breast: Secondary | ICD-10-CM | POA: Diagnosis not present

## 2020-02-12 ENCOUNTER — Ambulatory Visit (HOSPITAL_COMMUNITY): Payer: Medicare HMO | Attending: Cardiology

## 2020-02-12 ENCOUNTER — Other Ambulatory Visit: Payer: Self-pay

## 2020-02-12 DIAGNOSIS — I350 Nonrheumatic aortic (valve) stenosis: Secondary | ICD-10-CM | POA: Insufficient documentation

## 2020-04-11 DIAGNOSIS — H4720 Unspecified optic atrophy: Secondary | ICD-10-CM | POA: Diagnosis not present

## 2020-04-11 DIAGNOSIS — H52203 Unspecified astigmatism, bilateral: Secondary | ICD-10-CM | POA: Diagnosis not present

## 2020-04-11 DIAGNOSIS — H43813 Vitreous degeneration, bilateral: Secondary | ICD-10-CM | POA: Diagnosis not present

## 2020-04-11 DIAGNOSIS — Z961 Presence of intraocular lens: Secondary | ICD-10-CM | POA: Diagnosis not present

## 2020-06-03 DIAGNOSIS — E039 Hypothyroidism, unspecified: Secondary | ICD-10-CM | POA: Diagnosis not present

## 2020-06-03 DIAGNOSIS — E785 Hyperlipidemia, unspecified: Secondary | ICD-10-CM | POA: Diagnosis not present

## 2020-06-03 DIAGNOSIS — M8580 Other specified disorders of bone density and structure, unspecified site: Secondary | ICD-10-CM | POA: Diagnosis not present

## 2020-06-03 DIAGNOSIS — I25119 Atherosclerotic heart disease of native coronary artery with unspecified angina pectoris: Secondary | ICD-10-CM | POA: Diagnosis not present

## 2020-06-03 DIAGNOSIS — I10 Essential (primary) hypertension: Secondary | ICD-10-CM | POA: Diagnosis not present

## 2020-06-03 DIAGNOSIS — G35 Multiple sclerosis: Secondary | ICD-10-CM | POA: Diagnosis not present

## 2020-06-03 DIAGNOSIS — R413 Other amnesia: Secondary | ICD-10-CM | POA: Diagnosis not present

## 2020-06-03 DIAGNOSIS — I4891 Unspecified atrial fibrillation: Secondary | ICD-10-CM | POA: Diagnosis not present

## 2020-06-03 DIAGNOSIS — Z7901 Long term (current) use of anticoagulants: Secondary | ICD-10-CM | POA: Diagnosis not present

## 2020-06-03 DIAGNOSIS — I69959 Hemiplegia and hemiparesis following unspecified cerebrovascular disease affecting unspecified side: Secondary | ICD-10-CM | POA: Diagnosis not present

## 2020-06-16 DIAGNOSIS — R69 Illness, unspecified: Secondary | ICD-10-CM | POA: Diagnosis not present

## 2020-07-10 ENCOUNTER — Other Ambulatory Visit: Payer: Self-pay | Admitting: Cardiology

## 2020-07-10 DIAGNOSIS — R0600 Dyspnea, unspecified: Secondary | ICD-10-CM

## 2020-07-10 DIAGNOSIS — R0609 Other forms of dyspnea: Secondary | ICD-10-CM

## 2020-07-10 DIAGNOSIS — R600 Localized edema: Secondary | ICD-10-CM

## 2020-07-10 DIAGNOSIS — Z789 Other specified health status: Secondary | ICD-10-CM

## 2020-07-10 DIAGNOSIS — I10 Essential (primary) hypertension: Secondary | ICD-10-CM

## 2020-07-10 DIAGNOSIS — I2510 Atherosclerotic heart disease of native coronary artery without angina pectoris: Secondary | ICD-10-CM

## 2020-07-10 DIAGNOSIS — E782 Mixed hyperlipidemia: Secondary | ICD-10-CM

## 2020-07-15 ENCOUNTER — Ambulatory Visit: Payer: Medicare HMO | Admitting: Cardiology

## 2020-07-15 ENCOUNTER — Other Ambulatory Visit: Payer: Self-pay

## 2020-07-15 ENCOUNTER — Encounter: Payer: Self-pay | Admitting: Cardiology

## 2020-07-15 VITALS — BP 126/72 | HR 72 | Ht 62.0 in | Wt 158.4 lb

## 2020-07-15 DIAGNOSIS — E782 Mixed hyperlipidemia: Secondary | ICD-10-CM

## 2020-07-15 DIAGNOSIS — I2510 Atherosclerotic heart disease of native coronary artery without angina pectoris: Secondary | ICD-10-CM | POA: Diagnosis not present

## 2020-07-15 DIAGNOSIS — I10 Essential (primary) hypertension: Secondary | ICD-10-CM

## 2020-07-15 NOTE — Patient Instructions (Signed)

## 2020-07-15 NOTE — Progress Notes (Signed)
Virtual Visit via Telephone Note   Date:  07/15/2020   ID:  Kaitlin [name], DOB 1945/11/11, MRN [id number]  Patient Location: Home Provider Location: Home  PCP:  Chilton Greathouse, MD  Cardiologist:  Tobias Alexander, MD  Electrophysiologist:  None   Evaluation Performed:  Follow-Up Visit  Reason for visit: 6 months follow-up  History of Present Illness:    Kaitlin [name] is a 74 y.o. female with h/o CAD, s/p remote RCA PCI 2001, HTN, HLD, and stable multiple sclerosis. She presented to [facility] 02/15/17 with a L MCA occlusion s/p LMCA PTA and LICA PTA/stenting. While on telemetry she had documented PAF and anticoagulation was initiated. She remained on Plavix and Xarelto.CHADSVASC=5. She was maintaining normal sinus rhythm on amiodarone and beta blocker. 2-D echo LVEF 65-70% with mild AS, no cardiac source of emboli. Patient was transferred to cardiac rehabilitation. She is taking Repatha for hyperlipidemia.  07/15/2020 -patient is coming after 6 months, she has been doing great, she is able to walk around the house without any difficulties no chest pain or shortness of breath, no lower extremity edema orthopnea proximal nocturnal dyspnea no falls. She tolerates Praluent really well. All her lipids have been at goal. Her LFTs have normalized since she discontinued statins.  Past Medical History:  Diagnosis Date  . Abnormality of gait as late effect of stroke 02/23/2017  . Acute hypoxemic respiratory failure (HCC)   . Anemia   . Arthritis   . Atrial fibrillation with RVR (HCC) 02/17/2017  . CAD S/P percutaneous coronary angioplasty 2001  . Colon polyps   . CVA (cerebral vascular accident) (HCC) 01/2017  . DDD (degenerative disc disease), cervical   . Dysphagia as late effect of stroke 02/23/2017  . H/O blood clots   . HCVD (hypertensive cardiovascular disease) 01/2017   normal LVF with moderate LVH, grade 1 DD  . High cholesterol   . HTN (hypertension)   . Hypokalemia   . Labile  blood pressure   . Leukocytosis   . Multiple sclerosis (HCC)   . Osteopenia   . PAF (paroxysmal atrial fibrillation) (HCC) 01/2017  . Right hemiparesis [facility])    Past Surgical History:  Procedure Laterality Date  . CORONARY ANGIOPLASTY WITH STENT PLACEMENT  11/1999   RCA DES-[facility]  . IR ANGIO INTRA EXTRACRAN SEL COM CAROTID INNOMINATE UNI R MOD SED  02/15/2017  . IR ANGIO VERTEBRAL SEL SUBCLAVIAN INNOMINATE UNI R MOD SED  02/15/2017  . IR INTRAVSC STENT CERV CAROTID W/O EMB-PROT MOD SED INC ANGIO  02/15/2017  . IR PERCUTANEOUS ART THROMBECTOMY/INFUSION INTRACRANIAL INC DIAG ANGIO  02/15/2017  . IR RADIOLOGIST EVAL & MGMT  05/10/2017  . NECK SURGERY    . RADIOLOGY WITH ANESTHESIA N/A 02/14/2017   Procedure: RADIOLOGY WITH ANESTHESIA;  Surgeon: Radiologist, Medication, MD;  Location: [facility] OR;  Service: Radiology;  Laterality: N/A;  . WRIST SURGERY Left     Current Meds  Medication Sig  . Alirocumab (PRALUENT) 150 MG/ML SOAJ Inject 150 mg into the skin every 14 (fourteen) days.  . Ascorbic Acid (VITAMIN C) 500 MG CAPS Take 500 mg by mouth daily.  . Calcium Citrate-Vitamin D (CALCIUM CITRATE + D PO) Take 1 tablet by mouth daily.  . cholecalciferol (VITAMIN D) 1000 units tablet Take 3,000 Units by mouth daily.  . clopidogrel (PLAVIX) 75 MG tablet Take 1 tablet (75 mg total) by mouth daily.  . ferrous sulfate 325 (65 FE) MG tablet Take 325 mg by mouth 2 (two)  times daily with a meal.   . FLUoxetine (PROZAC) 10 MG capsule Take 1 capsule (10 mg total) by mouth daily.  . hydrochlorothiazide (MICROZIDE) 12.5 MG capsule TAKE 1 CAPSULE BY MOUTH EVERY DAY  . lisinopril (ZESTRIL) 2.5 MG tablet TAKE 1 TABLET BY MOUTH EVERY DAY  . metoprolol tartrate (LOPRESSOR) 50 MG tablet TAKE 1 TABLET BY MOUTH TWICE A DAY  . Multiple Vitamins-Minerals (PRESERVISION AREDS PO) Take 1 tablet by mouth 2 (two) times daily.  . pantoprazole (PROTONIX) 40 MG tablet Take 1 tablet (40 mg total) by mouth daily.  Carlena Hurl 15 MG  TABS tablet TAKE 1 TABLET (15 MG TOTAL) BY MOUTH DAILY WITH SUPPER.    Allergies:   Eliquis [apixaban] and Penicillins   Social History   Tobacco Use  . Smoking status: Former Smoker    Types: Cigarettes    Quit date: 1985    Years since quitting: 36.9  . Smokeless tobacco: Never Used  Vaping Use  . Vaping Use: Never used  Substance Use Topics  . Alcohol use: Yes    Alcohol/week: 3.0 standard drinks    Types: 3 Glasses of wine per week    Comment: wine 1-2 per day  . Drug use: No    Family Hx: The patient's family history includes Cancer in her father; Heart disease in her father; Multiple sclerosis in her sister; Ovarian cancer in her mother. There is no history of Colon cancer, Stomach cancer, Liver cancer, Pancreatic cancer, or Rectal cancer.  ROS:   Please see the history of present illness.    All other systems reviewed and are negative.  Prior CV studies:   The following studies were reviewed today:  Echocardiogram on 01/2020 1. Left ventricular ejection fraction, by estimation, is 60 to 65%. The  left ventricle has normal function. The left ventricle has no regional  wall motion abnormalities. There is mild left ventricular hypertrophy of  the septal segment. Left ventricular  diastolic parameters are consistent with Grade I diastolic dysfunction  (impaired relaxation).  2. Right ventricular systolic function is normal. The right ventricular  size is normal.  3. The mitral valve is normal in structure. Trivial mitral valve  regurgitation. No evidence of mitral stenosis.  4. The aortic valve is normal in structure. Aortic valve regurgitation is  not visualized. Mild to moderate aortic valve sclerosis/calcification is  present, without any evidence of aortic stenosis. Aortic valve mean  gradient measures 10.5 mmHg. Aortic  valve Vmax measures 2.26 m/s.  5. The inferior vena cava is normal in size with greater than 50%  respiratory variability, suggesting right  atrial pressure of 3 mmHg.   Labs/Other Tests and Data Reviewed:    EKG: EKG performed today 07/15/2020 shows normal sinus rhythm, negative T waves in the anterolateral leads unchanged from prior.  Recent Labs: No results found for requested labs within last 8760 hours.   Recent Lipid Panel Lab Results  Component Value Date/Time   CHOL 173 06/27/2018 02:09 PM   TRIG 116 06/27/2018 02:09 PM   HDL 99 06/27/2018 02:09 PM   CHOLHDL 1.7 06/27/2018 02:09 PM   CHOLHDL 2.5 02/15/2017 04:20 AM   LDLCALC 51 06/27/2018 02:09 PM    Wt Readings from Last 3 Encounters:  07/15/20 158 lb 6.4 oz (71.8 kg)  01/17/20 157 lb 3.2 oz (71.3 kg)  09/19/19 158 lb (71.7 kg)     Objective:    Vital Signs:  BP 126/72   Pulse 72   Ht  5\' 2"  (1.575 m)   Wt 158 lb 6.4 oz (71.8 kg)   SpO2 94%   BMI 28.97 kg/m    VITAL SIGNS:  reviewed   ASSESSMENT & PLAN:    LMCA infarct:  S/p LMCA PTA and LICA PTA/stenting on 7/17.  She remains on plavix and xarelto (in the setting of afib).  She has had 2 falls in 2018 with subarachnoid hemorrhage but none since then. Most recent carotid ultrasound from 01/2019 showed  Right Carotid: Velocities in the right ICA are consistent with a 1-39% stenosis. Left Carotid: Velocities in the left ICA stent are consistent with a <50% stenosis.  PAF - remains in SR, now off amiodarone, on Xarelto, Hb 11.8.  Hypertension -well controlled.  Lower extremity edema has resolved after adding hydrochlorothiazide.  Lower extremity edema -resolved with adding HCTZ 25 mg po daily  Hyperlipidemia on Praluent, lipids all at goal, normal LFTs, hemoglobin A1c at goal.  CAD with remote PCI to the RCA - Her last stress test was done in October 2019 and it was normal. She is asymptomatic.   Elevated LFTs -normalized.  Anemia -while on Plavix and Xarelto, most recent hemoglobin 11.8.  Mild aortic stenosis on echocardiogram in 2018, unchanged on most recent echo in July 2021.  COVID-19  Education: The signs and symptoms of COVID-19 were discussed with the patient and how to seek care for testing (follow up with PCP or arrange E-visit).  The importance of social distancing was discussed today.  Time:   Today, I have spent 25 minutes with the patient with telehealth technology discussing the above problems.    Medication Adjustments/Labs and Tests Ordered: Current medicines are reviewed at length with the patient today.  Concerns regarding medicines are outlined above.   Tests Ordered: Orders Placed This Encounter  Procedures  . EKG 12-Lead   Medication Changes: No orders of the defined types were placed in this encounter.  Follow Up:  In Person in 6 month(s)  Signed, August 2021, MD  07/15/2020 1:54 PM    [facility]

## 2020-07-16 ENCOUNTER — Other Ambulatory Visit: Payer: Self-pay | Admitting: Cardiology

## 2020-08-30 ENCOUNTER — Other Ambulatory Visit: Payer: Self-pay | Admitting: Cardiology

## 2020-09-11 ENCOUNTER — Other Ambulatory Visit: Payer: Self-pay | Admitting: Cardiology

## 2020-12-30 DIAGNOSIS — E785 Hyperlipidemia, unspecified: Secondary | ICD-10-CM | POA: Diagnosis not present

## 2020-12-30 DIAGNOSIS — M859 Disorder of bone density and structure, unspecified: Secondary | ICD-10-CM | POA: Diagnosis not present

## 2020-12-30 DIAGNOSIS — E039 Hypothyroidism, unspecified: Secondary | ICD-10-CM | POA: Diagnosis not present

## 2020-12-30 NOTE — Progress Notes (Signed)
Cardiology Office Note:    Date:  01/05/2021   ID:  Kaitlin [name], DOB 1945/12/10, MRN [id number]  PCP:  Chilton GreathouseAvva, Ravisankar, MD   [facility] Providers Cardiologist:  Tobias AlexanderKatarina Nelson, MD (Inactive) {    Referring MD: Chilton GreathouseAvva, Ravisankar, MD    History of Present Illness:    Kaitlin [name] is a 75 y.o. female with a hx of CAD s/p remote RCA PCI 2001, HTN, HLD, and stable multiple sclerosis who was previously followed by Dr. Delton SeeNelson who now returns to clinic for follow-up.  Per review of the record, the patient [facility]with aL MCA occlusion s/p LMCA PTA and LICA PTA/stenting. While on telemetry she had documented PAF and anticoagulation was initiated. She remained on Plavix and Xarelto.CHADSVASC=5. She was maintaining normal sinus rhythm on amiodarone and beta blocker. TTE LVEF 65-70% with mild AS, no cardiac source of emboli.   Last saw Dr. Delton SeeNelson on 07/15/20 where she was doing well. Tolerating praulent.   Today, the patient states she feels well. Tolerating all medications without issues. Bruises easily on the xarelto and plavix. No chest pain, orthopnea, PND, lightheadedness or syncope. Occasional shortness of breath with exertion but no chest discomfort. Blood pressure well controlled at home.  Past Medical History:  Diagnosis Date  . Abnormality of gait as late effect of stroke 02/23/2017  . Acute hypoxemic respiratory failure (HCC)   . Anemia   . Arthritis   . Atrial fibrillation with RVR (HCC) 02/17/2017  . CAD S/P percutaneous coronary angioplasty 2001  . Colon polyps   . CVA (cerebral vascular accident) (HCC) 01/2017  . DDD (degenerative disc disease), cervical   . Dysphagia as late effect of stroke 02/23/2017  . H/O blood clots   . HCVD (hypertensive cardiovascular disease) 01/2017   normal LVF with moderate LVH, grade 1 DD  . High cholesterol   . HTN (hypertension)   . Hypokalemia   . Labile blood pressure   . Leukocytosis   . Multiple sclerosis  (HCC)   . Osteopenia   . PAF (paroxysmal atrial fibrillation) (HCC) 01/2017  . Right hemiparesis [facility](HCC)     Past Surgical History:  Procedure Laterality Date  . CORONARY ANGIOPLASTY WITH STENT PLACEMENT  11/1999   RCA DES-[facility]  . IR ANGIO INTRA EXTRACRAN SEL COM CAROTID INNOMINATE UNI R MOD SED  02/15/2017  . IR ANGIO VERTEBRAL SEL SUBCLAVIAN INNOMINATE UNI R MOD SED  02/15/2017  . IR INTRAVSC STENT CERV CAROTID W/O EMB-PROT MOD SED INC ANGIO  02/15/2017  . IR PERCUTANEOUS ART THROMBECTOMY/INFUSION INTRACRANIAL INC DIAG ANGIO  02/15/2017  . IR RADIOLOGIST EVAL & MGMT  05/10/2017  . NECK SURGERY    . RADIOLOGY WITH ANESTHESIA N/A 02/14/2017   Procedure: RADIOLOGY WITH ANESTHESIA;  Surgeon: Radiologist, Medication, MD;  Location: [facility] OR;  Service: Radiology;  Laterality: N/A;  . WRIST SURGERY Left     Current Medications: Current Meds  Medication Sig  . Ascorbic Acid (VITAMIN C) 500 MG CAPS Take 500 mg by mouth daily.  . Calcium Citrate-Vitamin D (CALCIUM CITRATE + D PO) Take 1 tablet by mouth daily.  . cholecalciferol (VITAMIN D) 1000 units tablet Take 3,000 Units by mouth daily.  . clopidogrel (PLAVIX) 75 MG tablet TAKE 1 TABLET BY MOUTH EVERY DAY  . ferrous sulfate 325 (65 FE) MG tablet Take 325 mg by mouth 2 (two) times daily with a meal.   . FLUoxetine (PROZAC) 10 MG capsule Take 1 capsule (10 mg total) by  mouth daily.  . hydrochlorothiazide (MICROZIDE) 12.5 MG capsule TAKE 1 CAPSULE BY MOUTH EVERY DAY  . lisinopril (ZESTRIL) 2.5 MG tablet TAKE 1 TABLET BY MOUTH EVERY DAY  . metoprolol tartrate (LOPRESSOR) 50 MG tablet TAKE 1 TABLET BY MOUTH TWICE A DAY  . Multiple Vitamins-Minerals (PRESERVISION AREDS PO) Take 1 tablet by mouth 2 (two) times daily.  . pantoprazole (PROTONIX) 40 MG tablet Take 1 tablet (40 mg total) by mouth daily.  Marland Kitchen PRALUENT 150 MG/ML SOAJ INJECT 150 MG INTO THE SKIN EVERY 14 (FOURTEEN) DAYS.  Marland Kitchen XARELTO 15 MG TABS tablet TAKE 1 TABLET (15 MG TOTAL) BY MOUTH DAILY  WITH SUPPER.     Allergies:   Eliquis [apixaban] and Penicillins   Social History   Socioeconomic History  . Marital status: Married    Spouse name: Not on file  . Number of children: 0  . Years of education: Not on file  . Highest education level: Not on file  Occupational History  . Occupation: retired  Tobacco Use  . Smoking status: Former Smoker    Types: Cigarettes    Quit date: 1985    Years since quitting: 37.4  . Smokeless tobacco: Never Used  Vaping Use  . Vaping Use: Never used  Substance and Sexual Activity  . Alcohol use: Yes    Alcohol/week: 3.0 standard drinks    Types: 3 Glasses of wine per week    Comment: wine 1-2 per day  . Drug use: No  . Sexual activity: Not on file  Other Topics Concern  . Not on file  Social History Narrative  . Not on file   Social Determinants of Health   Financial Resource Strain: Not on file  Food Insecurity: Not on file  Transportation Needs: Not on file  Physical Activity: Not on file  Stress: Not on file  Social Connections: Not on file     Family History: The patient's family history includes Cancer in her father; Heart disease in her father; Multiple sclerosis in her sister; Ovarian cancer in her mother. There is no history of Colon cancer, Stomach cancer, Liver cancer, Pancreatic cancer, or Rectal cancer.  ROS:   Please see the history of present illness.    Review of Systems  Constitutional: Negative for chills and fever.  HENT: Negative for sore throat.   Eyes: Negative for blurred vision.  Respiratory: Positive for shortness of breath.   Cardiovascular: Negative for chest pain, palpitations, orthopnea, claudication, leg swelling and PND.  Gastrointestinal: Negative for blood in stool and melena.  Genitourinary: Negative for hematuria.  Musculoskeletal: Negative for falls.  Neurological: Negative for loss of consciousness.  Endo/Heme/Allergies: Bruises/bleeds easily.  Psychiatric/Behavioral: Negative for  substance abuse.    EKGs/Labs/Other Studies Reviewed:    The following studies were reviewed today: Echocardiogram on 01/2020 1. Left ventricular ejection fraction, by estimation, is 60 to 65%. The  left ventricle has normal function. The left ventricle has no regional  wall motion abnormalities. There is mild left ventricular hypertrophy of  the septal segment. Left ventricular  diastolic parameters are consistent with Grade I diastolic dysfunction  (impaired relaxation).  2. Right ventricular systolic function is normal. The right ventricular  size is normal.  3. The mitral valve is normal in structure. Trivial mitral valve  regurgitation. No evidence of mitral stenosis.  4. The aortic valve is normal in structure. Aortic valve regurgitation is  not visualized. Mild to moderate aortic valve sclerosis/calcification is  present, without any evidence of  aortic stenosis. Aortic valve mean  gradient measures 10.5 mmHg. Aortic  valve Vmax measures 2.26 m/s.  5. The inferior vena cava is normal in size with greater than 50%  respiratory variability, suggesting right atrial pressure of 3 mmHg.   Myoview 2019:  Nuclear stress EF: 69%. The left ventricular ejection fraction is hyperdynamic (>65%).  There was no ST segment deviation noted during stress.  The study is normal. no evidence of ischemia or previous MI    EKG:  EKG is  ordered today.  The ekg ordered today demonstrates NSR with HR 68, TWI in anterolateral leads (chronic)  Recent Labs: No results found for requested labs within last 8760 hours.  Recent Lipid Panel    Component Value Date/Time   CHOL 173 06/27/2018 1409   TRIG 116 06/27/2018 1409   HDL 99 06/27/2018 1409   CHOLHDL 1.7 06/27/2018 1409   CHOLHDL 2.5 02/15/2017 0420   VLDL 29 02/15/2017 0420   LDLCALC 51 06/27/2018 1409     Risk Assessment/Calculations:     CHA2DS2-VASc Score = 7  The patient's score is based upon: CHF History: No HTN History:  Yes Diabetes History: No Stroke History: Yes Vascular Disease History: Yes Age Score: 2 Gender Score: 1    Physical Exam:    VS:  BP 130/76   Pulse 68   Ht 5\' 2"  (1.575 m)   Wt 170 lb (77.1 kg)   SpO2 95%   BMI 31.09 kg/m     Wt Readings from Last 3 Encounters:  01/05/21 170 lb (77.1 kg)  07/15/20 158 lb 6.4 oz (71.8 kg)  01/17/20 157 lb 3.2 oz (71.3 kg)     GEN:  Comfortable, wheelchair bound HEENT: Normal NECK: No JVD; No carotid bruits CARDIAC: RRR, no murmurs, rubs, gallops RESPIRATORY:  Clear to auscultation without rales, wheezing or rhonchi  ABDOMEN: Soft, non-tender, non-distended MUSCULOSKELETAL:  No edema; No deformity  SKIN: Warm and dry NEUROLOGIC:  Alert and oriented x 3 PSYCHIATRIC:  Normal affect   ASSESSMENT:    1. Mixed hyperlipidemia   2. Coronary artery disease involving native coronary artery of native heart without angina pectoris   3. Bilateral carotid artery stenosis   4. Essential hypertension   5. Paroxysmal A-fib (HCC)   6. Atherosclerotic cardiovascular disease   7. Statin myopathy   8. Aortic valve stenosis, etiology of cardiac valve disease unspecified   9. Cerebrovascular accident (CVA) due to thrombosis of precerebral artery (HCC)    PLAN:    In order of problems listed above:  #History of CVA with Left MCA infarct: S/p LMCA PTA and LICA PTA/stenting on 7/17. She remains on plavix and xarelto (in the setting of afib).  She has had 2 falls in 2018 with subarachnoid hemorrhage but none since then. Most recent carotid ultrasound from 01/2019 with 1-39% RICA and <50% LICA.  -Continue plavix 75mg  daily--may consider changing to ASA in the future given stroke was in 2017 -Continue xarelto 15mg  daily as below -Repeat carotid ultrasound for monitoring  #CAD s/p remote PCI to RCA in 2001: No anginal symptoms. TTE with normal LVEF 60-65%, G1DD. -Continue plavix 75mg  daily -Continue praulent 140mg  q2weeks -Continue metop 50mg   BID  #Paroxysmal Afib: CHADs-vasc 7. Remains in rhythm today. -Continue xarelto 15mg  daily -Continue metop tartrate 50mg  BID  #Mild AS: Mean gradient 2018 in 01/2020: -Repeat TTE in 2024 for monitoring  #HTN: Well controlled and at goal <120s/80s. -Continue lisinopril 2.5mg  daily -Continue metop 50mg  BID  #LE  Edema: Resolved. -Continue HCTZ 12.5mg  daily  #HLD: #Statin Intolerance: LDL at goal of 57 in 12/2019.  -Continue praulent 150mg  q2weeks -Planned for repeat cholesterol with PCP per patient preference     Medication Adjustments/Labs and Tests Ordered: Current medicines are reviewed at length with the patient today.  Concerns regarding medicines are outlined above.  Orders Placed This Encounter  Procedures  . EKG 12-Lead  . VAS CAROTID   No orders of the defined types were placed in this encounter.   Patient Instructions  Medication Instructions:   Your physician recommends that you continue on your current medications as directed. Please refer to the Current Medication list given to you today.  *If you need a refill on your cardiac medications before your next appointment, please call your pharmacy*   Testing/Procedures:  Your physician has requested that you have a carotid duplex. This test is an ultrasound of the carotid arteries in your neck. It looks at blood flow through these arteries that supply the brain with blood. Allow one hour for this exam. There are no restrictions or special instructions.   Follow-Up:  6 MONTHS IN THE OFFICE WITH DR. [place]       Signed, Shari Prows, MD  01/05/2021 12:54 PM    [facility]

## 2021-01-05 ENCOUNTER — Other Ambulatory Visit: Payer: Self-pay

## 2021-01-05 ENCOUNTER — Ambulatory Visit: Payer: Medicare HMO | Admitting: Cardiology

## 2021-01-05 ENCOUNTER — Encounter: Payer: Self-pay | Admitting: Cardiology

## 2021-01-05 VITALS — BP 130/76 | HR 68 | Ht 62.0 in | Wt 170.0 lb

## 2021-01-05 DIAGNOSIS — T466X5A Adverse effect of antihyperlipidemic and antiarteriosclerotic drugs, initial encounter: Secondary | ICD-10-CM

## 2021-01-05 DIAGNOSIS — I480 Paroxysmal atrial fibrillation: Secondary | ICD-10-CM

## 2021-01-05 DIAGNOSIS — I350 Nonrheumatic aortic (valve) stenosis: Secondary | ICD-10-CM

## 2021-01-05 DIAGNOSIS — G720 Drug-induced myopathy: Secondary | ICD-10-CM | POA: Diagnosis not present

## 2021-01-05 DIAGNOSIS — I6300 Cerebral infarction due to thrombosis of unspecified precerebral artery: Secondary | ICD-10-CM | POA: Diagnosis not present

## 2021-01-05 DIAGNOSIS — I6523 Occlusion and stenosis of bilateral carotid arteries: Secondary | ICD-10-CM

## 2021-01-05 DIAGNOSIS — I10 Essential (primary) hypertension: Secondary | ICD-10-CM

## 2021-01-05 DIAGNOSIS — E782 Mixed hyperlipidemia: Secondary | ICD-10-CM | POA: Diagnosis not present

## 2021-01-05 DIAGNOSIS — I2510 Atherosclerotic heart disease of native coronary artery without angina pectoris: Secondary | ICD-10-CM | POA: Diagnosis not present

## 2021-01-05 NOTE — Patient Instructions (Signed)
Medication Instructions:   Your physician recommends that you continue on your current medications as directed. Please refer to the Current Medication list given to you today.  *If you need a refill on your cardiac medications before your next appointment, please call your pharmacy*   Testing/Procedures:  Your physician has requested that you have a carotid duplex. This test is an ultrasound of the carotid arteries in your neck. It looks at blood flow through these arteries that supply the brain with blood. Allow one hour for this exam. There are no restrictions or special instructions.   Follow-Up:  6 MONTHS IN THE OFFICE WITH DR. Shari Prows

## 2021-01-06 DIAGNOSIS — I25119 Atherosclerotic heart disease of native coronary artery with unspecified angina pectoris: Secondary | ICD-10-CM | POA: Diagnosis not present

## 2021-01-06 DIAGNOSIS — E785 Hyperlipidemia, unspecified: Secondary | ICD-10-CM | POA: Diagnosis not present

## 2021-01-06 DIAGNOSIS — I4891 Unspecified atrial fibrillation: Secondary | ICD-10-CM | POA: Diagnosis not present

## 2021-01-06 DIAGNOSIS — R69 Illness, unspecified: Secondary | ICD-10-CM | POA: Diagnosis not present

## 2021-01-06 DIAGNOSIS — I10 Essential (primary) hypertension: Secondary | ICD-10-CM | POA: Diagnosis not present

## 2021-01-06 DIAGNOSIS — E871 Hypo-osmolality and hyponatremia: Secondary | ICD-10-CM | POA: Diagnosis not present

## 2021-01-06 DIAGNOSIS — D692 Other nonthrombocytopenic purpura: Secondary | ICD-10-CM | POA: Diagnosis not present

## 2021-01-06 DIAGNOSIS — Z0000 Encounter for general adult medical examination without abnormal findings: Secondary | ICD-10-CM | POA: Diagnosis not present

## 2021-01-06 DIAGNOSIS — G35 Multiple sclerosis: Secondary | ICD-10-CM | POA: Diagnosis not present

## 2021-01-06 DIAGNOSIS — Z7901 Long term (current) use of anticoagulants: Secondary | ICD-10-CM | POA: Diagnosis not present

## 2021-01-06 DIAGNOSIS — I69959 Hemiplegia and hemiparesis following unspecified cerebrovascular disease affecting unspecified side: Secondary | ICD-10-CM | POA: Diagnosis not present

## 2021-01-21 ENCOUNTER — Ambulatory Visit (HOSPITAL_COMMUNITY)
Admission: RE | Admit: 2021-01-21 | Discharge: 2021-01-21 | Disposition: A | Discharge status: Home / Self Care | Payer: Medicare HMO | Source: Ambulatory Visit | Attending: Cardiovascular Disease | Admitting: Cardiovascular Disease

## 2021-01-21 ENCOUNTER — Other Ambulatory Visit (HOSPITAL_COMMUNITY): Payer: Self-pay | Admitting: Cardiology

## 2021-01-21 ENCOUNTER — Other Ambulatory Visit: Payer: Self-pay

## 2021-01-21 DIAGNOSIS — Z9889 Other specified postprocedural states: Secondary | ICD-10-CM

## 2021-01-21 DIAGNOSIS — I6523 Occlusion and stenosis of bilateral carotid arteries: Secondary | ICD-10-CM | POA: Diagnosis not present

## 2021-01-21 DIAGNOSIS — I2510 Atherosclerotic heart disease of native coronary artery without angina pectoris: Secondary | ICD-10-CM | POA: Insufficient documentation

## 2021-01-21 DIAGNOSIS — Z95828 Presence of other vascular implants and grafts: Secondary | ICD-10-CM

## 2021-01-21 DIAGNOSIS — E782 Mixed hyperlipidemia: Secondary | ICD-10-CM | POA: Insufficient documentation

## 2021-01-22 ENCOUNTER — Telehealth: Payer: Self-pay | Admitting: *Deleted

## 2021-01-22 DIAGNOSIS — I6523 Occlusion and stenosis of bilateral carotid arteries: Secondary | ICD-10-CM

## 2021-01-22 DIAGNOSIS — I2510 Atherosclerotic heart disease of native coronary artery without angina pectoris: Secondary | ICD-10-CM

## 2021-01-22 NOTE — Telephone Encounter (Signed)
-----   Message from Meriam Sprague, MD sent at 01/22/2021  8:33 AM EDT ----- Carotid ultrasound looks stable with no significant blockage. We will continue to monitor every 2 years.

## 2021-01-22 NOTE — Telephone Encounter (Signed)
Pt and husband aware of carotid US results and recommendations per Dr. Shari Prows. They are aware we will go ahead and place the order for her to have this repeated in 2 years, and send a message to our PCC/PV schedulers to call her back and arrange this appt closer to that time. Both verbalized understanding and agrees with this plan.

## 2021-04-09 DIAGNOSIS — Z1231 Encounter for screening mammogram for malignant neoplasm of breast: Secondary | ICD-10-CM | POA: Diagnosis not present

## 2021-04-20 DIAGNOSIS — H353132 Nonexudative age-related macular degeneration, bilateral, intermediate dry stage: Secondary | ICD-10-CM | POA: Diagnosis not present

## 2021-04-20 DIAGNOSIS — H35372 Puckering of macula, left eye: Secondary | ICD-10-CM | POA: Diagnosis not present

## 2021-04-20 DIAGNOSIS — H524 Presbyopia: Secondary | ICD-10-CM | POA: Diagnosis not present

## 2021-04-20 DIAGNOSIS — H4720 Unspecified optic atrophy: Secondary | ICD-10-CM | POA: Diagnosis not present

## 2021-04-23 DIAGNOSIS — R928 Other abnormal and inconclusive findings on diagnostic imaging of breast: Secondary | ICD-10-CM | POA: Diagnosis not present

## 2021-04-23 DIAGNOSIS — N6489 Other specified disorders of breast: Secondary | ICD-10-CM | POA: Diagnosis not present

## 2021-04-23 DIAGNOSIS — R922 Inconclusive mammogram: Secondary | ICD-10-CM | POA: Diagnosis not present

## 2021-06-16 ENCOUNTER — Other Ambulatory Visit: Payer: Self-pay | Admitting: Pharmacist

## 2021-06-16 MED ORDER — PRALUENT 150 MG/ML ~~LOC~~ SOAJ: 150.0000 mg | SUBCUTANEOUS | 11 refills | Status: DC

## 2021-06-17 DIAGNOSIS — E785 Hyperlipidemia, unspecified: Secondary | ICD-10-CM | POA: Diagnosis not present

## 2021-06-17 DIAGNOSIS — G35 Multiple sclerosis: Secondary | ICD-10-CM | POA: Diagnosis not present

## 2021-06-17 DIAGNOSIS — K219 Gastro-esophageal reflux disease without esophagitis: Secondary | ICD-10-CM | POA: Diagnosis not present

## 2021-06-17 DIAGNOSIS — R69 Illness, unspecified: Secondary | ICD-10-CM | POA: Diagnosis not present

## 2021-06-17 DIAGNOSIS — M1990 Unspecified osteoarthritis, unspecified site: Secondary | ICD-10-CM | POA: Diagnosis not present

## 2021-06-17 DIAGNOSIS — I10 Essential (primary) hypertension: Secondary | ICD-10-CM | POA: Diagnosis not present

## 2021-06-17 DIAGNOSIS — Z7901 Long term (current) use of anticoagulants: Secondary | ICD-10-CM | POA: Diagnosis not present

## 2021-06-17 DIAGNOSIS — I2510 Atherosclerotic heart disease of native coronary artery without angina pectoris: Secondary | ICD-10-CM | POA: Diagnosis not present

## 2021-06-17 DIAGNOSIS — G3184 Mild cognitive impairment, so stated: Secondary | ICD-10-CM | POA: Diagnosis not present

## 2021-06-17 DIAGNOSIS — I4891 Unspecified atrial fibrillation: Secondary | ICD-10-CM | POA: Diagnosis not present

## 2021-06-17 DIAGNOSIS — I69328 Other speech and language deficits following cerebral infarction: Secondary | ICD-10-CM | POA: Diagnosis not present

## 2021-06-17 DIAGNOSIS — D6869 Other thrombophilia: Secondary | ICD-10-CM | POA: Diagnosis not present

## 2021-06-17 DIAGNOSIS — Z008 Encounter for other general examination: Secondary | ICD-10-CM | POA: Diagnosis not present

## 2021-06-17 DIAGNOSIS — I69353 Hemiplegia and hemiparesis following cerebral infarction affecting right non-dominant side: Secondary | ICD-10-CM | POA: Diagnosis not present

## 2021-06-17 DIAGNOSIS — D649 Anemia, unspecified: Secondary | ICD-10-CM | POA: Diagnosis not present

## 2021-06-28 NOTE — Progress Notes (Deleted)
Cardiology Office Note:    Date:  06/28/2021   ID:  Kaitlin [name], DOB 1946-05-08, MRN [id number]  PCP:  Chilton Greathouse, MD   [facility] Providers Cardiologist:  Tobias Alexander, MD (Inactive) {    Referring MD: Chilton Greathouse, MD    History of Present Illness:    Kaitlin [name] is a 75 y.o. female with a hx of CAD s/p remote RCA PCI 2001, HTN, HLD, and stable multiple sclerosis who was previously followed by Dr. Delton See who now returns to clinic for follow-up.  Per review of the record, the patient presented to [facility] 02/15/17 with a L MCA occlusion s/p LMCA PTA and LICA PTA/stenting. While on telemetry she had documented PAF and anticoagulation was initiated. She remained on Plavix and Xarelto.CHADSVASC=5. She was maintaining normal sinus rhythm on amiodarone and beta blocker. TTE LVEF 65-70% with mild AS, no cardiac source of emboli.   Last seen in clinic on 01/05/21 where she was doing overall well with no CV complaints.  Today,   Past Medical History:  Diagnosis Date   Abnormality of gait as late effect of stroke 02/23/2017   Acute hypoxemic respiratory failure (HCC)    Anemia    Arthritis    Atrial fibrillation with RVR (HCC) 02/17/2017   CAD S/P percutaneous coronary angioplasty 2001   Colon polyps    CVA (cerebral vascular accident) (HCC) 01/2017   DDD (degenerative disc disease), cervical    Dysphagia as late effect of stroke 02/23/2017   H/O blood clots    HCVD (hypertensive cardiovascular disease) 01/2017   normal LVF with moderate LVH, grade 1 DD   High cholesterol    HTN (hypertension)    Hypokalemia    Labile blood pressure    Leukocytosis    Multiple sclerosis (HCC)    Osteopenia    PAF (paroxysmal atrial fibrillation) (HCC) 01/2017   Right hemiparesis (HCC)     Past Surgical History:  Procedure Laterality Date   CORONARY ANGIOPLASTY WITH STENT PLACEMENT  11/1999   RCA DES-[facility]   IR ANGIO INTRA EXTRACRAN SEL COM CAROTID INNOMINATE UNI R MOD  SED  02/15/2017   IR ANGIO VERTEBRAL SEL SUBCLAVIAN INNOMINATE UNI R MOD SED  02/15/2017   IR INTRAVSC STENT CERV CAROTID W/O EMB-PROT MOD SED INC ANGIO  02/15/2017   IR PERCUTANEOUS ART THROMBECTOMY/INFUSION INTRACRANIAL INC DIAG ANGIO  02/15/2017   IR RADIOLOGIST EVAL & MGMT  05/10/2017   NECK SURGERY     RADIOLOGY WITH ANESTHESIA N/A 02/14/2017   Procedure: RADIOLOGY WITH ANESTHESIA;  Surgeon: Radiologist, Medication, MD;  Location: [facility] OR;  Service: Radiology;  Laterality: N/A;   WRIST SURGERY Left     Current Medications: No outpatient medications have been marked as taking for the 07/08/21 encounter (Appointment) with Meriam Sprague, MD.     Allergies:   Eliquis [apixaban] and Penicillins   Social History   Socioeconomic History   Marital status: Married    Spouse name: Not on file   Number of children: 0   Years of education: Not on file   Highest education level: Not on file  Occupational History   Occupation: retired  Tobacco Use   Smoking status: Former    Types: Cigarettes    Quit date: 1985    Years since quitting: 37.9   Smokeless tobacco: Never  Vaping Use   Vaping Use: Never used  Substance and Sexual Activity   Alcohol use: Yes    Alcohol/week: 3.0 standard drinks  Types: 3 Glasses of wine per week    Comment: wine 1-2 per day   Drug use: No   Sexual activity: Not on file  Other Topics Concern   Not on file  Social History Narrative   Not on file   Social Determinants of Health   Financial Resource Strain: Not on file  Food Insecurity: Not on file  Transportation Needs: Not on file  Physical Activity: Not on file  Stress: Not on file  Social Connections: Not on file     Family History: The patient's family history includes Cancer in her father; Heart disease in her father; Multiple sclerosis in her sister; Ovarian cancer in her mother. There is no history of Colon cancer, Stomach cancer, Liver cancer, Pancreatic cancer, or Rectal  cancer.  ROS:   Please see the history of present illness.    Review of Systems  Constitutional:  Negative for chills and fever.  HENT:  Negative for sore throat.   Eyes:  Negative for blurred vision.  Respiratory:  Positive for shortness of breath.   Cardiovascular:  Negative for chest pain, palpitations, orthopnea, claudication, leg swelling and PND.  Gastrointestinal:  Negative for blood in stool and melena.  Genitourinary:  Negative for hematuria.  Musculoskeletal:  Negative for falls.  Neurological:  Negative for loss of consciousness.  Endo/Heme/Allergies:  Bruises/bleeds easily.  Psychiatric/Behavioral:  Negative for substance abuse.    EKGs/Labs/Other Studies Reviewed:    The following studies were reviewed today: Carotid ultrasound 01/21/21: Summary:  Right Carotid: Velocities in the right ICA are consistent with a 1-39%  stenosis.                 Non-hemodynamically significant plaque <50% noted in the  CCA.   Left Carotid: Non-hemodynamically significant plaque <50% noted in the  CCA. No                evidence of distal CCA-mid ICA stent stenosis.   Vertebrals:  Bilateral vertebral arteries demonstrate antegrade flow.  Subclavians: Bilateral subclavian artery flow was disturbed.   Echocardiogram on 01/2020 1. Left ventricular ejection fraction, by estimation, is 60 to 65%. The  left ventricle has normal function. The left ventricle has no regional  wall motion abnormalities. There is mild left ventricular hypertrophy of  the septal segment. Left ventricular  diastolic parameters are consistent with Grade I diastolic dysfunction  (impaired relaxation).   2. Right ventricular systolic function is normal. The right ventricular  size is normal.   3. The mitral valve is normal in structure. Trivial mitral valve  regurgitation. No evidence of mitral stenosis.   4. The aortic valve is normal in structure. Aortic valve regurgitation is  not visualized. Mild to  moderate aortic valve sclerosis/calcification is  present, without any evidence of aortic stenosis. Aortic valve mean  gradient measures 10.5 mmHg. Aortic  valve Vmax measures 2.26 m/s.   5. The inferior vena cava is normal in size with greater than 50%  respiratory variability, suggesting right atrial pressure of 3 mmHg.   Myoview 2019: Nuclear stress EF: 69%. The left ventricular ejection fraction is hyperdynamic (>65%). There was no ST segment deviation noted during stress. The study is normal. no evidence of ischemia or previous MI    EKG:  EKG is  ordered today.  The ekg ordered today demonstrates NSR with HR 68, TWI in anterolateral leads (chronic)  Recent Labs: No results found for requested labs within last 8760 hours.  Recent Lipid Panel  Component Value Date/Time   CHOL 173 06/27/2018 1409   TRIG 116 06/27/2018 1409   HDL 99 06/27/2018 1409   CHOLHDL 1.7 06/27/2018 1409   CHOLHDL 2.5 02/15/2017 0420   VLDL 29 02/15/2017 0420   LDLCALC 51 06/27/2018 1409     Risk Assessment/Calculations:     CHA2DS2-VASc Score = 7  The patient's score is based upon: CHF History: 0 HTN History: 1 Diabetes History: 0 Stroke History: 2 Vascular Disease History: 1 Age Score: 2 Gender Score: 1    Physical Exam:    VS:  There were no vitals taken for this visit.    Wt Readings from Last 3 Encounters:  01/05/21 170 lb (77.1 kg)  07/15/20 158 lb 6.4 oz (71.8 kg)  01/17/20 157 lb 3.2 oz (71.3 kg)     GEN:  Comfortable, wheelchair bound HEENT: Normal NECK: No JVD; No carotid bruits CARDIAC: RRR, no murmurs, rubs, gallops RESPIRATORY:  Clear to auscultation without rales, wheezing or rhonchi  ABDOMEN: Soft, non-tender, non-distended MUSCULOSKELETAL:  No edema; No deformity  SKIN: Warm and dry NEUROLOGIC:  Alert and oriented x 3 PSYCHIATRIC:  Normal affect   ASSESSMENT:    No diagnosis found.  PLAN:    In order of problems listed above:  #History of CVA with  Left MCA infarct: S/p LMCA PTA and LICA PTA/stenting on 7/17.  She remains on plavix and xarelto (in the setting of afib).  She has had 2 falls in 2018 with subarachnoid hemorrhage but none since then. Most recent carotid ultrasound from 01/2019 with [id number] RICA and [id number] LICA.  -Stop plavix and start ASA 81mg  daily -Continue xarelto 15mg  daily as below  #CAD s/p remote PCI to RCA in 2001: No anginal symptoms. TTE with normal LVEF 60-65%, G1DD. -Continue plavix 75mg  daily -Continue praulent 140mg  q2weeks -Continue metop 50mg  BID  #Paroxysmal Afib: CHADs-vasc 7. Remains in rhythm today. -Continue xarelto 15mg  daily -Continue metop tartrate 50mg  BID  #Mild AS: Mean gradient 4mmHg in 01/2020. -Repeat TTE in 2024 for monitoring  #Mild Carotid Artery Disease: 1-39% in the RICA; Patent distal CCA-mid ICA stent on the left in 12/2020. -Continue annual surveillance -Continue xarelto, plavix and praulent as above  #HTN: Well controlled and at goal <120s/80s. -Continue lisinopril 2.5mg  daily -Continue metop 50mg  BID  #LE Edema: Resolved. -Continue HCTZ 12.5mg  daily  #HLD: #Statin Intolerance: LDL at goal of 57 in 12/2019.  -Continue praulent 150mg  q2weeks -Planned for repeat cholesterol with PCP per patient preference     Medication Adjustments/Labs and Tests Ordered: Current medicines are reviewed at length with the patient today.  Concerns regarding medicines are outlined above.  No orders of the defined types were placed in this encounter.  No orders of the defined types were placed in this encounter.   There are no Patient Instructions on file for this visit.    Signed, Freada Bergeron, MD  06/28/2021 1:25 PM    [facility]

## 2021-06-30 ENCOUNTER — Other Ambulatory Visit: Payer: Self-pay | Admitting: *Deleted

## 2021-06-30 DIAGNOSIS — I10 Essential (primary) hypertension: Secondary | ICD-10-CM

## 2021-06-30 DIAGNOSIS — R600 Localized edema: Secondary | ICD-10-CM

## 2021-06-30 DIAGNOSIS — I2510 Atherosclerotic heart disease of native coronary artery without angina pectoris: Secondary | ICD-10-CM

## 2021-06-30 DIAGNOSIS — R0609 Other forms of dyspnea: Secondary | ICD-10-CM

## 2021-06-30 DIAGNOSIS — E782 Mixed hyperlipidemia: Secondary | ICD-10-CM

## 2021-06-30 DIAGNOSIS — Z789 Other specified health status: Secondary | ICD-10-CM

## 2021-06-30 MED ORDER — LISINOPRIL 2.5 MG PO TABS: 2.5000 mg | ORAL_TABLET | Freq: Every day | ORAL | 3 refills | Status: DC

## 2021-07-08 ENCOUNTER — Ambulatory Visit: Payer: Medicare HMO | Admitting: Cardiology

## 2021-07-08 ENCOUNTER — Encounter: Payer: Self-pay | Admitting: Cardiology

## 2021-07-08 ENCOUNTER — Other Ambulatory Visit: Payer: Self-pay

## 2021-07-08 VITALS — BP 120/76 | HR 73 | Ht 62.0 in | Wt 156.0 lb

## 2021-07-08 DIAGNOSIS — I2510 Atherosclerotic heart disease of native coronary artery without angina pectoris: Secondary | ICD-10-CM | POA: Diagnosis not present

## 2021-07-08 DIAGNOSIS — I480 Paroxysmal atrial fibrillation: Secondary | ICD-10-CM | POA: Diagnosis not present

## 2021-07-08 DIAGNOSIS — G720 Drug-induced myopathy: Secondary | ICD-10-CM | POA: Diagnosis not present

## 2021-07-08 DIAGNOSIS — I350 Nonrheumatic aortic (valve) stenosis: Secondary | ICD-10-CM | POA: Diagnosis not present

## 2021-07-08 DIAGNOSIS — I6523 Occlusion and stenosis of bilateral carotid arteries: Secondary | ICD-10-CM

## 2021-07-08 DIAGNOSIS — I10 Essential (primary) hypertension: Secondary | ICD-10-CM

## 2021-07-08 DIAGNOSIS — T466X5A Adverse effect of antihyperlipidemic and antiarteriosclerotic drugs, initial encounter: Secondary | ICD-10-CM

## 2021-07-08 DIAGNOSIS — I6300 Cerebral infarction due to thrombosis of unspecified precerebral artery: Secondary | ICD-10-CM

## 2021-07-08 MED ORDER — ASPIRIN EC 81 MG PO TBEC: 81.0000 mg | DELAYED_RELEASE_TABLET | Freq: Every day | ORAL | 3 refills | Status: AC

## 2021-07-08 NOTE — Progress Notes (Signed)
Cardiology Office Note:    Date:  07/08/2021   ID:  Kaitlin [name], DOB Aug 11, 1945, MRN [id number]  PCP:  Chilton Greathouse, MD   [facility] Providers Cardiologist:  Tobias Alexander, MD (Inactive) {    Referring MD: Chilton Greathouse, MD    History of Present Illness:    Kaitlin [name] is a 75 y.o. female with a hx of CAD s/p remote RCA PCI 2001, HTN, HLD, and stable multiple sclerosis who was previously followed by Dr. Delton See who now returns to clinic for follow-up.  Per review of the record, the patient presented to [facility] 02/15/17 with a L MCA occlusion s/p LMCA PTA and LICA PTA/stenting. While on telemetry she had documented PAF and anticoagulation was initiated. She remained on Plavix and Xarelto.CHADSVASC=5. She was maintaining normal sinus rhythm on amiodarone and beta blocker. TTE LVEF 65-70% with mild AS, no cardiac source of emboli.   Last seen in clinic on 01/05/21 where she was doing overall well with no CV complaints.  Today, the patient states she is doing well. She and her husband wanted to know about continued usage of Plavix and Xarelto, due to bleeding concerns. This is in due part because, around 6 weeks ago her husband woke up in the middle of the night and felt blood "everywhere". This was caused by a small cut on her finger apparently which took a long time to stop.   She denies palpitations, chest pain, dizziness or feeling faint. No SOB, lightheadedness, nausea or vomiting.   Past Medical History:  Diagnosis Date   Abnormality of gait as late effect of stroke 02/23/2017   Acute hypoxemic respiratory failure (HCC)    Anemia    Arthritis    Atrial fibrillation with RVR (HCC) 02/17/2017   CAD S/P percutaneous coronary angioplasty 2001   Colon polyps    CVA (cerebral vascular accident) (HCC) 01/2017   DDD (degenerative disc disease), cervical    Dysphagia as late effect of stroke 02/23/2017   H/O blood clots    HCVD (hypertensive cardiovascular disease)  01/2017   normal LVF with moderate LVH, grade 1 DD   High cholesterol    HTN (hypertension)    Hypokalemia    Labile blood pressure    Leukocytosis    Multiple sclerosis (HCC)    Osteopenia    PAF (paroxysmal atrial fibrillation) (HCC) 01/2017   Right hemiparesis (HCC)     Past Surgical History:  Procedure Laterality Date   CORONARY ANGIOPLASTY WITH STENT PLACEMENT  11/1999   [facility]CA [facility]   IR ANGIO INTRA EXTRACRAN SEL COM CAROTID INNOMINATE UNI R MOD SED  02/15/2017   IR ANGIO VERTEBRAL SEL SUBCLAVIAN INNOMINATE UNI R MOD SED  02/15/2017   IR INTRAVSC STENT CERV CAROTID W/O EMB-PROT MOD SED INC ANGIO  02/15/2017   IR PERCUTANEOUS ART THROMBECTOMY/INFUSION INTRACRANIAL INC DIAG ANGIO  02/15/2017   IR RADIOLOGIST EVAL & MGMT  05/10/2017   NECK SURGERY     RADIOLOGY WITH ANESTHESIA N/A 02/14/2017   Procedure: RADIOLOGY WITH ANESTHESIA;  Surgeon: Radiologist, Medication, MD;  Location: [facility] OR;  Service: Radiology;  Laterality: N/A;   WRIST SURGERY Left     Current Medications: Current Meds  Medication Sig   Alirocumab (PRALUENT) 150 MG/ML SOAJ Inject 150 mg into the skin every 14 (fourteen) days.   Ascorbic Acid (VITAMIN C) 500 MG CAPS Take 500 mg by mouth daily.   aspirin EC 81 MG tablet Take 1 tablet (81 mg total) by mouth daily.  Swallow whole.   Calcium Citrate-Vitamin D (CALCIUM CITRATE + D PO) Take 1 tablet by mouth daily.   cholecalciferol (VITAMIN D) 1000 units tablet Take 3,000 Units by mouth daily.   ferrous sulfate 325 (65 FE) MG tablet Take 325 mg by mouth 2 (two) times daily with a meal.    FLUoxetine (PROZAC) 10 MG capsule Take 1 capsule (10 mg total) by mouth daily.   hydrochlorothiazide (MICROZIDE) 12.5 MG capsule TAKE 1 CAPSULE BY MOUTH EVERY DAY   lisinopril (ZESTRIL) 2.5 MG tablet Take 1 tablet (2.5 mg total) by mouth daily.   metoprolol tartrate (LOPRESSOR) 50 MG tablet TAKE 1 TABLET BY MOUTH TWICE A DAY   Multiple Vitamins-Minerals (PRESERVISION AREDS PO) Take 1  tablet by mouth 2 (two) times daily.   pantoprazole (PROTONIX) 40 MG tablet Take 1 tablet (40 mg total) by mouth daily.   XARELTO 15 MG TABS tablet TAKE 1 TABLET (15 MG TOTAL) BY MOUTH DAILY WITH SUPPER.   [DISCONTINUED] clopidogrel (PLAVIX) 75 MG tablet TAKE 1 TABLET BY MOUTH EVERY DAY     Allergies:   Eliquis [apixaban] and Penicillins   Social History   Socioeconomic History   Marital status: Married    Spouse name: Not on file   Number of children: 0   Years of education: Not on file   Highest education level: Not on file  Occupational History   Occupation: retired  Tobacco Use   Smoking status: Former    Types: Cigarettes    Quit date: 1985    Years since quitting: 37.9   Smokeless tobacco: Never  Vaping Use   Vaping Use: Never used  Substance and Sexual Activity   Alcohol use: Yes    Alcohol/week: 3.0 standard drinks    Types: 3 Glasses of wine per week    Comment: wine 1-2 per day   Drug use: No   Sexual activity: Not on file  Other Topics Concern   Not on file  Social History Narrative   Not on file   Social Determinants of Health   Financial Resource Strain: Not on file  Food Insecurity: Not on file  Transportation Needs: Not on file  Physical Activity: Not on file  Stress: Not on file  Social Connections: Not on file     Family History: The patient's family history includes Cancer in her father; Heart disease in her father; Multiple sclerosis in her sister; Ovarian cancer in her mother. There is no history of Colon cancer, Stomach cancer, Liver cancer, Pancreatic cancer, or Rectal cancer.  ROS:   Please see the history of present illness.    Review of Systems  Constitutional:  Negative for chills and fever.  HENT:  Negative for sore throat.   Eyes:  Negative for blurred vision.  Respiratory:  Positive for shortness of breath.   Cardiovascular:  Negative for chest pain, palpitations, orthopnea, claudication, leg swelling and PND.  Gastrointestinal:   Negative for blood in stool and melena.  Genitourinary:  Negative for hematuria.  Musculoskeletal:  Negative for falls.  Neurological:  Negative for loss of consciousness.  Endo/Heme/Allergies:  Bruises/bleeds easily.  Psychiatric/Behavioral:  Negative for substance abuse.    EKGs/Labs/Other Studies Reviewed:    The following studies were reviewed today: Carotid ultrasound 01/21/21: Summary:  Right Carotid: Velocities in the right ICA are consistent with a 1-39%  stenosis.                 Non-hemodynamically significant plaque <50% noted in the  CCA.   Left Carotid: Non-hemodynamically significant plaque <50% noted in the  CCA. No                evidence of distal CCA-mid ICA stent stenosis.   Vertebrals:  Bilateral vertebral arteries demonstrate antegrade flow.  Subclavians: Bilateral subclavian artery flow was disturbed.   Echocardiogram on 01/2020 1. Left ventricular ejection fraction, by estimation, is 60 to 65%. The  left ventricle has normal function. The left ventricle has no regional  wall motion abnormalities. There is mild left ventricular hypertrophy of  the septal segment. Left ventricular  diastolic parameters are consistent with Grade I diastolic dysfunction  (impaired relaxation).   2. Right ventricular systolic function is normal. The right ventricular  size is normal.   3. The mitral valve is normal in structure. Trivial mitral valve  regurgitation. No evidence of mitral stenosis.   4. The aortic valve is normal in structure. Aortic valve regurgitation is  not visualized. Mild to moderate aortic valve sclerosis/calcification is  present, without any evidence of aortic stenosis. Aortic valve mean  gradient measures 10.5 mmHg. Aortic  valve Vmax measures 2.26 m/s.   5. The inferior vena cava is normal in size with greater than 50%  respiratory variability, suggesting right atrial pressure of 3 mmHg.   Myoview 2019: Nuclear stress EF: 69%. The left ventricular  ejection fraction is hyperdynamic (>65%). There was no ST segment deviation noted during stress. The study is normal. no evidence of ischemia or previous MI    EKG:  No new ECG  Recent Labs: No results found for requested labs within last 8760 hours.  Recent Lipid Panel    Component Value Date/Time   CHOL 173 06/27/2018 1409   TRIG 116 06/27/2018 1409   HDL 99 06/27/2018 1409   CHOLHDL 1.7 06/27/2018 1409   CHOLHDL 2.5 02/15/2017 0420   VLDL 29 02/15/2017 0420   LDLCALC 51 06/27/2018 1409     Risk Assessment/Calculations:     CHA2DS2-VASc Score = 7  The patient's score is based upon: CHF History: 0 HTN History: 1 Diabetes History: 0 Stroke History: 2 Vascular Disease History: 1 Age Score: 2 Gender Score: 1    Physical Exam:    VS:  BP 120/76   Pulse 73   Ht 5\' 2"  (1.575 m)   Wt 156 lb (70.8 kg)   SpO2 95%   BMI 28.53 kg/m     Wt Readings from Last 3 Encounters:  07/08/21 156 lb (70.8 kg)  01/05/21 170 lb (77.1 kg)  07/15/20 158 lb 6.4 oz (71.8 kg)     GEN:  Comfortable, wheelchair bound HEENT: Normal NECK: No JVD; No carotid bruits CARDIAC: RRR, 1/6 systolic murmur RESPIRATORY:  Clear to auscultation without rales, wheezing or rhonchi  ABDOMEN: Soft, non-tender, non-distended MUSCULOSKELETAL:  No edema; No deformity  SKIN: Warm and dry NEUROLOGIC:  Alert and oriented x 3 PSYCHIATRIC:  Normal affect   ASSESSMENT:    1. Coronary artery disease involving native coronary artery of native heart without angina pectoris   2. Bilateral carotid artery stenosis   3. Essential hypertension   4. Paroxysmal A-fib ([place])   5. Aortic valve stenosis, etiology of cardiac valve disease unspecified   6. Statin myopathy   7. Cerebrovascular accident (CVA) due to thrombosis of precerebral artery (HCC)     PLAN:    In order of problems listed above:  #History of CVA with Left MCA infarct: S/p LMCA PTA and LICA PTA/stenting on  7/17.  She remains on plavix and  xarelto (in the setting of afib).  She has had 2 falls in 2018 with subarachnoid hemorrhage but none since then. Most recent carotid ultrasound from 01/2019 with 1-39% RICA and <50% LICA.  -Change plavix to ASA 81mg  daily -Continue xarelto 15mg  daily as below  #CAD s/p remote PCI to RCA in 2001: No anginal symptoms. TTE with normal LVEF 60-65%, G1DD. -Change plavix to ASA 81mg  daily -Continue praulent 140mg  q2weeks -Continue metop 50mg  BID  #Paroxysmal Afib: CHADs-vasc 7. Tolerating AC. On metop for rate control. -Continue xarelto 15mg  daily -Continue metop tartrate 50mg  BID  #Mild AS: Mean gradient in 01/2020. -Repeat TTE in 2024 for monitoring  #Mild Carotid Artery Disease: 1-39% in the RICA; Patent distal CCA-mid ICA stent on the left in 12/2020. -Continue annual surveillance (next 12/2021) -Continue xarelto, ASA and praulent as above  #HTN: Well controlled and at goal <120s/80s. -Continue lisinopril 2.5mg  daily -Continue metop 50mg  BID  #LE Edema: Resolved. -Continue HCTZ 12.5mg  daily  #HLD: #Statin Intolerance: LDL at goal of 57 in 12/2019.  -Continue praulent 150mg  q2weeks -Planned for repeat cholesterol with PCP per patient preference   Medication Adjustments/Labs and Tests Ordered: Current medicines are reviewed at length with the patient today.  Concerns regarding medicines are outlined above.  No orders of the defined types were placed in this encounter.  Meds ordered this encounter  Medications   aspirin EC 81 MG tablet    Sig: Take 1 tablet (81 mg total) by mouth daily. Swallow whole.    Dispense:  90 tablet    Refill:  3    Patient Instructions  Medication Instructions:   STOP TAKING PLAVIX NOW  START TAKING ASPIRIN 81 MG BY MOUTH DAILY  *If you need a refill on your cardiac medications before your next appointment, please call your pharmacy*    Follow-Up: At [facility], you and your health needs are our priority.  As part of our  continuing mission to provide you with exceptional heart care, we have created designated Provider Care Teams.  These Care Teams include your primary Cardiologist (physician) and Advanced Practice Providers (APPs -  Physician Assistants and Nurse Practitioners) who all work together to provide you with the care you need, when you need it.  We recommend signing up for the patient portal called "MyChart".  Sign up information is provided on this After Visit Summary.  MyChart is used to connect with patients for Virtual Visits (Telemedicine).  Patients are able to view lab/test results, encounter notes, upcoming appointments, etc.  Non-urgent messages can be sent to your provider as well.   To learn more about what you can do with MyChart, go to .    Your next appointment:   6 month(s)  The format for your next appointment:   In Person  Provider:   DR. 03/2020    Signed,  I,Jordan Kelly,acting as a scribe for 2025, MD.,have documented all relevant documentation on the behalf of 01/2021, MD,as directed by  01/2022, MD while in the presence of , MD.  I, 01/2020, MD, have reviewed all documentation for this visit. The documentation on 07/08/21 for the exam, diagnosis, procedures, and orders are all accurate and complete.  07/08/2021 10:28 AM    [facility]

## 2021-07-08 NOTE — Patient Instructions (Signed)
Medication Instructions:   STOP TAKING PLAVIX NOW  START TAKING ASPIRIN 81 MG BY MOUTH DAILY  *If you need a refill on your cardiac medications before your next appointment, please call your pharmacy*   Follow-Up: At [facility], you and your health needs are our priority.  As part of our continuing mission to provide you with exceptional heart care, we have created designated Provider Care Teams.  These Care Teams include your primary Cardiologist (physician) and Advanced Practice Providers (APPs -  Physician Assistants and Nurse Practitioners) who all work together to provide you with the care you need, when you need it.  We recommend signing up for the patient portal called "MyChart".  Sign up information is provided on this After Visit Summary.  MyChart is used to connect with patients for Virtual Visits (Telemedicine).  Patients are able to view lab/test results, encounter notes, upcoming appointments, etc.  Non-urgent messages can be sent to your provider as well.   To learn more about what you can do with MyChart, go to [email or web address].    Your next appointment:   6 month(s)  The format for your next appointment:   In Person  Provider:   DR. PEMBERTON  

## 2021-07-09 DIAGNOSIS — I25119 Atherosclerotic heart disease of native coronary artery with unspecified angina pectoris: Secondary | ICD-10-CM | POA: Diagnosis not present

## 2021-07-09 DIAGNOSIS — E785 Hyperlipidemia, unspecified: Secondary | ICD-10-CM | POA: Diagnosis not present

## 2021-07-09 DIAGNOSIS — I4891 Unspecified atrial fibrillation: Secondary | ICD-10-CM | POA: Diagnosis not present

## 2021-07-09 DIAGNOSIS — D649 Anemia, unspecified: Secondary | ICD-10-CM | POA: Diagnosis not present

## 2021-07-09 DIAGNOSIS — I10 Essential (primary) hypertension: Secondary | ICD-10-CM | POA: Diagnosis not present

## 2021-07-09 DIAGNOSIS — E039 Hypothyroidism, unspecified: Secondary | ICD-10-CM | POA: Diagnosis not present

## 2021-07-09 DIAGNOSIS — R413 Other amnesia: Secondary | ICD-10-CM | POA: Diagnosis not present

## 2021-07-09 DIAGNOSIS — G35 Multiple sclerosis: Secondary | ICD-10-CM | POA: Diagnosis not present

## 2021-07-09 DIAGNOSIS — Z23 Encounter for immunization: Secondary | ICD-10-CM | POA: Diagnosis not present

## 2021-07-09 DIAGNOSIS — I69959 Hemiplegia and hemiparesis following unspecified cerebrovascular disease affecting unspecified side: Secondary | ICD-10-CM | POA: Diagnosis not present

## 2021-07-09 DIAGNOSIS — D692 Other nonthrombocytopenic purpura: Secondary | ICD-10-CM | POA: Diagnosis not present

## 2021-09-07 ENCOUNTER — Other Ambulatory Visit: Payer: Self-pay | Admitting: *Deleted

## 2021-09-07 MED ORDER — METOPROLOL TARTRATE 50 MG PO TABS: 50.0000 mg | ORAL_TABLET | Freq: Two times a day (BID) | ORAL | 3 refills | Status: DC

## 2021-12-23 DIAGNOSIS — H353132 Nonexudative age-related macular degeneration, bilateral, intermediate dry stage: Secondary | ICD-10-CM | POA: Diagnosis not present

## 2021-12-23 DIAGNOSIS — H3500 Unspecified background retinopathy: Secondary | ICD-10-CM | POA: Diagnosis not present

## 2021-12-23 DIAGNOSIS — H35372 Puckering of macula, left eye: Secondary | ICD-10-CM | POA: Diagnosis not present

## 2022-01-12 ENCOUNTER — Ambulatory Visit: Payer: Medicare HMO | Admitting: Cardiology

## 2022-02-01 DIAGNOSIS — E039 Hypothyroidism, unspecified: Secondary | ICD-10-CM | POA: Diagnosis not present

## 2022-02-01 DIAGNOSIS — M8580 Other specified disorders of bone density and structure, unspecified site: Secondary | ICD-10-CM | POA: Diagnosis not present

## 2022-02-01 DIAGNOSIS — D649 Anemia, unspecified: Secondary | ICD-10-CM | POA: Diagnosis not present

## 2022-02-01 DIAGNOSIS — E785 Hyperlipidemia, unspecified: Secondary | ICD-10-CM | POA: Diagnosis not present

## 2022-02-01 DIAGNOSIS — I10 Essential (primary) hypertension: Secondary | ICD-10-CM | POA: Diagnosis not present

## 2022-02-08 DIAGNOSIS — E785 Hyperlipidemia, unspecified: Secondary | ICD-10-CM | POA: Diagnosis not present

## 2022-02-08 DIAGNOSIS — I10 Essential (primary) hypertension: Secondary | ICD-10-CM | POA: Diagnosis not present

## 2022-02-08 DIAGNOSIS — Z23 Encounter for immunization: Secondary | ICD-10-CM | POA: Diagnosis not present

## 2022-02-08 DIAGNOSIS — G35 Multiple sclerosis: Secondary | ICD-10-CM | POA: Diagnosis not present

## 2022-02-08 DIAGNOSIS — I25119 Atherosclerotic heart disease of native coronary artery with unspecified angina pectoris: Secondary | ICD-10-CM | POA: Diagnosis not present

## 2022-02-08 DIAGNOSIS — M5030 Other cervical disc degeneration, unspecified cervical region: Secondary | ICD-10-CM | POA: Diagnosis not present

## 2022-02-08 DIAGNOSIS — Z0000 Encounter for general adult medical examination without abnormal findings: Secondary | ICD-10-CM | POA: Diagnosis not present

## 2022-02-08 DIAGNOSIS — D649 Anemia, unspecified: Secondary | ICD-10-CM | POA: Diagnosis not present

## 2022-02-08 DIAGNOSIS — D692 Other nonthrombocytopenic purpura: Secondary | ICD-10-CM | POA: Diagnosis not present

## 2022-02-08 DIAGNOSIS — I4891 Unspecified atrial fibrillation: Secondary | ICD-10-CM | POA: Diagnosis not present

## 2022-02-08 DIAGNOSIS — I69959 Hemiplegia and hemiparesis following unspecified cerebrovascular disease affecting unspecified side: Secondary | ICD-10-CM | POA: Diagnosis not present

## 2022-02-08 DIAGNOSIS — F39 Unspecified mood [affective] disorder: Secondary | ICD-10-CM | POA: Diagnosis not present

## 2022-02-08 DIAGNOSIS — R413 Other amnesia: Secondary | ICD-10-CM | POA: Diagnosis not present

## 2022-02-13 NOTE — Progress Notes (Unsigned)
Cardiology Office Note:    Date:  02/15/2022   ID:  Kaitlin [name], DOB [birth year], MRN [id number]  PCP:  Prince Solian, MD   [facility] Providers Cardiologist:  Ena Dawley, MD {    Referring MD: Prince Solian, MD    History of Present Illness:    Kaitlin [name] is a 76 y.o. female with a hx of CAD s/p remote RCA PCI 2001, HTN, HLD, and stable multiple sclerosis who was previously followed by Dr. Meda Coffee who now returns to clinic for follow-up.  Per review of the record, the patient presented to [facility] 02/15/17 with a L MCA occlusion s/p LMCA PTA and LICA PTA/stenting. While on telemetry she had documented PAF and anticoagulation was initiated. She remained on Plavix and Xarelto.CHADSVASC=5. She was maintaining normal sinus rhythm on amiodarone and beta blocker. TTE LVEF 65-70% with mild AS, no cardiac source of emboli.   Was last seen in clinic on 07/2021 where he was doing well from a CV standpoint. We changed plavix to ASA given episodes of bleeding while on plavix and xarelto.  Today, the patient overall is doing okay. She recently had her fluoxetine to help with anxiety. No significant chest pain. Continues to have dyspnea on exertion which is unchanged. Has trace LE edema. Bruising improved off plavix. No lightheadedness, dizziness, palpitations, or syncope.    Past Medical History:  Diagnosis Date   Abnormality of gait as late effect of stroke 02/23/2017   Acute hypoxemic respiratory failure (HCC)    Anemia    Arthritis    Atrial fibrillation with RVR ([place]) 02/17/2017   CAD S/P percutaneous coronary angioplasty 2001   Colon polyps    CVA (cerebral vascular accident) ([place]) 01/2017   DDD (degenerative disc disease), cervical    Dysphagia as late effect of stroke 02/23/2017   H/O blood clots    HCVD (hypertensive cardiovascular disease) 01/2017   normal LVF with moderate LVH, grade 1 DD   High cholesterol    HTN (hypertension)    Hypokalemia    Labile blood  pressure    Leukocytosis    Multiple sclerosis (HCC)    Osteopenia    PAF (paroxysmal atrial fibrillation) ([place]) 01/2017   Right hemiparesis ([place])     Past Surgical History:  Procedure Laterality Date   CORONARY ANGIOPLASTY WITH STENT PLACEMENT  11/1999   RCA DES-[facility]   IR ANGIO INTRA EXTRACRAN SEL COM CAROTID INNOMINATE UNI R MOD SED  02/15/2017   IR ANGIO VERTEBRAL SEL SUBCLAVIAN INNOMINATE UNI R MOD SED  02/15/2017   IR INTRAVSC STENT CERV CAROTID W/O EMB-PROT MOD SED INC ANGIO  02/15/2017   IR PERCUTANEOUS ART THROMBECTOMY/INFUSION INTRACRANIAL INC DIAG ANGIO  02/15/2017   IR RADIOLOGIST EVAL & MGMT  05/10/2017   NECK SURGERY     RADIOLOGY WITH ANESTHESIA N/A 02/14/2017   Procedure: RADIOLOGY WITH ANESTHESIA;  Surgeon: Radiologist, Medication, MD;  Location: [facility];  Service: Radiology;  Laterality: N/A;   WRIST SURGERY Left     Current Medications: Current Meds  Medication Sig   Alirocumab (PRALUENT) 150 MG/ML SOAJ Inject 150 mg into the skin every 14 (fourteen) days.   Ascorbic Acid (VITAMIN C) 500 MG CAPS Take 500 mg by mouth daily.   aspirin EC 81 MG tablet Take 1 tablet (81 mg total) by mouth daily. Swallow whole.   Calcium Citrate-Vitamin D (CALCIUM CITRATE + D PO) Take 1 tablet by mouth daily.   cholecalciferol (VITAMIN D) 1000 units tablet Take 3,000  Units by mouth daily.   ferrous sulfate 325 (65 FE) MG tablet Take 325 mg by mouth 2 (two) times daily with a meal.    FLUoxetine (PROZAC) 20 MG capsule Take 20 mg by mouth daily.   hydrochlorothiazide (MICROZIDE) 12.5 MG capsule TAKE 1 CAPSULE BY MOUTH EVERY DAY   lisinopril (ZESTRIL) 2.5 MG tablet Take 1 tablet (2.5 mg total) by mouth daily.   metoprolol tartrate (LOPRESSOR) 50 MG tablet Take 1 tablet (50 mg total) by mouth 2 (two) times daily.   Multiple Vitamins-Minerals (PRESERVISION AREDS PO) Take 1 tablet by mouth 2 (two) times daily.   pantoprazole (PROTONIX) 40 MG tablet Take 1 tablet (40 mg total) by mouth daily.    XARELTO 15 MG TABS tablet TAKE 1 TABLET (15 MG TOTAL) BY MOUTH DAILY WITH SUPPER.     Allergies:   Eliquis [apixaban] and Penicillins   Social History   Socioeconomic History   Marital status: Married    Spouse name: Not on file   Number of children: 0   Years of education: Not on file   Highest education level: Not on file  Occupational History   Occupation: retired  Tobacco Use   Smoking status: Former    Types: Cigarettes    Quit date: 1985    Years since quitting: 38.5   Smokeless tobacco: Never  Vaping Use   Vaping Use: Never used  Substance and Sexual Activity   Alcohol use: Yes    Alcohol/week: 3.0 standard drinks of alcohol    Types: 3 Glasses of wine per week    Comment: wine 1-2 per day   Drug use: No   Sexual activity: Not on file  Other Topics Concern   Not on file  Social History Narrative   Not on file   Social Determinants of Health   Financial Resource Strain: Not on file  Food Insecurity: Not on file  Transportation Needs: Not on file  Physical Activity: Not on file  Stress: Not on file  Social Connections: Not on file     Family History: The patient's family history includes Cancer in her father; Heart disease in her father; Multiple sclerosis in her sister; Ovarian cancer in her mother. There is no history of Colon cancer, Stomach cancer, Liver cancer, Pancreatic cancer, or Rectal cancer.  ROS:   Please see the history of present illness.    Review of Systems  Constitutional:  Negative for chills and fever.  HENT:  Negative for sore throat.   Eyes:  Negative for blurred vision.  Respiratory:  Positive for shortness of breath.   Cardiovascular:  Negative for chest pain, palpitations, orthopnea, claudication, leg swelling and PND.  Gastrointestinal:  Negative for blood in stool and melena.  Genitourinary:  Negative for hematuria.  Musculoskeletal:  Negative for falls.  Neurological:  Negative for loss of consciousness.  Endo/Heme/Allergies:   Does not bruise/bleed easily.  Psychiatric/Behavioral:  Negative for substance abuse.     EKGs/Labs/Other Studies Reviewed:    The following studies were reviewed today: Carotid ultrasound 01/21/21: Summary:  Right Carotid: Velocities in the right ICA are consistent with a 1-39%  stenosis.                 Non-hemodynamically significant plaque <50% noted in the  CCA.   Left Carotid: Non-hemodynamically significant plaque <50% noted in the  CCA. No                evidence of distal CCA-mid ICA stent  stenosis.   Vertebrals:  Bilateral vertebral arteries demonstrate antegrade flow.  Subclavians: Bilateral subclavian artery flow was disturbed.   Echocardiogram on 01/2020 1. Left ventricular ejection fraction, by estimation, is 60 to 65%. The  left ventricle has normal function. The left ventricle has no regional  wall motion abnormalities. There is mild left ventricular hypertrophy of  the septal segment. Left ventricular  diastolic parameters are consistent with Grade I diastolic dysfunction  (impaired relaxation).   2. Right ventricular systolic function is normal. The right ventricular  size is normal.   3. The mitral valve is normal in structure. Trivial mitral valve  regurgitation. No evidence of mitral stenosis.   4. The aortic valve is normal in structure. Aortic valve regurgitation is  not visualized. Mild to moderate aortic valve sclerosis/calcification is  present, without any evidence of aortic stenosis. Aortic valve mean  gradient measures 10.5 mmHg. Aortic  valve Vmax measures 2.26 m/s.   5. The inferior vena cava is normal in size with greater than 50%  respiratory variability, suggesting right atrial pressure of 3 mmHg.   Myoview 2019: Nuclear stress EF: 69%. The left ventricular ejection fraction is hyperdynamic (>65%). There was no ST segment deviation noted during stress. The study is normal. no evidence of ischemia or previous MI    EKG:  ECG 02/15/22: NSR  with LVH with strain  Recent Labs: No results found for requested labs within last 365 days.  Recent Lipid Panel    Component Value Date/Time   CHOL 173 06/27/2018 1409   TRIG 116 06/27/2018 1409   HDL 99 06/27/2018 1409   CHOLHDL 1.7 06/27/2018 1409   CHOLHDL 2.5 02/15/2017 0420   VLDL 29 02/15/2017 0420   LDLCALC 51 06/27/2018 1409     Risk Assessment/Calculations:     CHA2DS2-VASc Score = 7  The patient's score is based upon: CHF History: 0 HTN History: 1 Diabetes History: 0 Stroke History: 2 Vascular Disease History: 1 Age Score: 2 Gender Score: 1     Physical Exam:    VS:  BP 110/60 (BP Location: Left Arm, Patient Position: Sitting, Cuff Size: Normal)   Pulse 62   Ht 5\' 2"  (1.575 m)   Wt 128 lb (58.1 kg)   BMI 23.41 kg/m     Wt Readings from Last 3 Encounters:  02/15/22 128 lb (58.1 kg)  07/08/21 156 lb (70.8 kg)  01/05/21 170 lb (77.1 kg)     GEN:  Comfortable, wheelchair bound HEENT: Normal NECK: No JVD; No carotid bruits CARDIAC: RRR, 2/6 systolic murmur RESPIRATORY:  CTAB, no wheezes  ABDOMEN: Soft, non-tender, non-distended MUSCULOSKELETAL:  No edema; No deformity  SKIN: Warm and dry NEUROLOGIC:  Alert and oriented x 3 PSYCHIATRIC:  Normal affect   ASSESSMENT:    1. Coronary artery disease involving native coronary artery of native heart without angina pectoris   2. Aortic valve stenosis, etiology of cardiac valve disease unspecified   3. Essential hypertension   4. Bilateral carotid artery stenosis   5. Paroxysmal A-fib (HCC)   6. Cerebrovascular accident (CVA) due to thrombosis of precerebral artery (HCC)   7. Mixed hyperlipidemia   8. Statin myopathy     PLAN:    In order of problems listed above:  #History of CVA with Left MCA infarct: S/p LMCA PTA and LICA PTA/stenting on 7/17.  She has had 2 falls in 2018 with subarachnoid hemorrhage but none since then. Most recent carotid ultrasound from 01/2019 with 1-39% RICA and <50%  LICA.   -Continue xarelto 15mg  daily as below -Continue ASA 81mg  daily  #CAD s/p remote PCI to RCA in 2001: No anginal symptoms. TTE with normal LVEF 60-65%, G1DD. -Continue ASA 81mg  daily -Continue praulent 140mg  q2weeks -Continue metop 50mg  BID  #Paroxysmal Afib: CHADs-vasc 7. Tolerating AC. On metop for rate control. -Continue xarelto 15mg  daily -Continue metop tartrate 50mg  BID  #Mild AS: Mean gradient in 01/2020. -Repeat TTE for monitoring  #Mild Carotid Artery Disease: 1-39% in the RICA; Patent distal CCA-mid ICA stent on the left in 12/2020. -Repeat in 2024 -Continue xarelto, ASA and praulent as above  #HTN: Well controlled and at goal <120s/80s. -Continue lisinopril 2.5mg  daily -Continue metop 50mg  BID  #LE Edema: Mild on exam. -Continue HCTZ 12.5mg  daily  #HLD: #Statin Intolerance: LDL at goal of 57 in 12/2019.  -Continue praulent 150mg  q2weeks -Planned for repeat cholesterol with PCP per patient preference   Medication Adjustments/Labs and Tests Ordered: Current medicines are reviewed at length with the patient today.  Concerns regarding medicines are outlined above.  Orders Placed This Encounter  Procedures   EKG 12-Lead   ECHOCARDIOGRAM COMPLETE   No orders of the defined types were placed in this encounter.   Patient Instructions  Medication Instructions:   Your physician recommends that you continue on your current medications as directed. Please refer to the Current Medication list given to you today.  *If you need a refill on your cardiac medications before your next appointment, please call your pharmacy*   Testing/Procedures:  Your physician has requested that you have an echocardiogram. Echocardiography is a painless test that uses sound waves to create images of your heart. It provides your doctor with information about the size and shape of your heart and how well your heart's chambers and valves are working. This procedure takes  approximately one hour. There are no restrictions for this procedure.   Follow-Up: At [facility], you and your health needs are our priority.  As part of our continuing mission to provide you with exceptional heart care, we have created designated Provider Care Teams.  These Care Teams include your primary Cardiologist (physician) and Advanced Practice Providers (APPs -  Physician Assistants and Nurse Practitioners) who all work together to provide you with the care you need, when you need it.  We recommend signing up for the patient portal called "MyChart".  Sign up information is provided on this After Visit Summary.  MyChart is used to connect with patients for Virtual Visits (Telemedicine).  Patients are able to view lab/test results, encounter notes, upcoming appointments, etc.  Non-urgent messages can be sent to your provider as well.   To learn more about what you can do with MyChart, go to .    Your next appointment:   6 month(s)  The format for your next appointment:   In Person  Provider:   DR.   Important Information About Sugar          Signed,  I,Jordan Kelly,acting as a scribe for 03/2020, MD.,have documented all relevant documentation on the behalf of 01/2021, MD,as directed by  2025, MD while in the presence of , MD.  I, 01/2020, MD, have reviewed all documentation for this visit. The documentation on 02/15/22 for the exam, diagnosis, procedures, and orders are all accurate and complete.  02/15/2022 3:16 PM    [facility]

## 2022-02-15 ENCOUNTER — Ambulatory Visit: Payer: Medicare HMO | Admitting: Cardiology

## 2022-02-15 ENCOUNTER — Encounter: Payer: Self-pay | Admitting: Cardiology

## 2022-02-15 VITALS — BP 110/60 | HR 62 | Ht 62.0 in | Wt 128.0 lb

## 2022-02-15 DIAGNOSIS — I6300 Cerebral infarction due to thrombosis of unspecified precerebral artery: Secondary | ICD-10-CM | POA: Diagnosis not present

## 2022-02-15 DIAGNOSIS — T466X5A Adverse effect of antihyperlipidemic and antiarteriosclerotic drugs, initial encounter: Secondary | ICD-10-CM

## 2022-02-15 DIAGNOSIS — I480 Paroxysmal atrial fibrillation: Secondary | ICD-10-CM | POA: Diagnosis not present

## 2022-02-15 DIAGNOSIS — G720 Drug-induced myopathy: Secondary | ICD-10-CM | POA: Diagnosis not present

## 2022-02-15 DIAGNOSIS — T466X5D Adverse effect of antihyperlipidemic and antiarteriosclerotic drugs, subsequent encounter: Secondary | ICD-10-CM

## 2022-02-15 DIAGNOSIS — I6523 Occlusion and stenosis of bilateral carotid arteries: Secondary | ICD-10-CM

## 2022-02-15 DIAGNOSIS — I350 Nonrheumatic aortic (valve) stenosis: Secondary | ICD-10-CM | POA: Diagnosis not present

## 2022-02-15 DIAGNOSIS — I2510 Atherosclerotic heart disease of native coronary artery without angina pectoris: Secondary | ICD-10-CM | POA: Diagnosis not present

## 2022-02-15 DIAGNOSIS — I10 Essential (primary) hypertension: Secondary | ICD-10-CM

## 2022-02-15 DIAGNOSIS — E782 Mixed hyperlipidemia: Secondary | ICD-10-CM

## 2022-02-15 NOTE — Patient Instructions (Signed)
Medication Instructions:   Your physician recommends that you continue on your current medications as directed. Please refer to the Current Medication list given to you today.  *If you need a refill on your cardiac medications before your next appointment, please call your pharmacy*   Testing/Procedures:  Your physician has requested that you have an echocardiogram. Echocardiography is a painless test that uses sound waves to create images of your heart. It provides your doctor with information about the size and shape of your heart and how well your heart's chambers and valves are working. This procedure takes approximately one hour. There are no restrictions for this procedure.   Follow-Up: At [facility], you and your health needs are our priority.  As part of our continuing mission to provide you with exceptional heart care, we have created designated Provider Care Teams.  These Care Teams include your primary Cardiologist (physician) and Advanced Practice Providers (APPs -  Physician Assistants and Nurse Practitioners) who all work together to provide you with the care you need, when you need it.  We recommend signing up for the patient portal called "MyChart".  Sign up information is provided on this After Visit Summary.  MyChart is used to connect with patients for Virtual Visits (Telemedicine).  Patients are able to view lab/test results, encounter notes, upcoming appointments, etc.  Non-urgent messages can be sent to your provider as well.   To learn more about what you can do with MyChart, go to [email or web address].    Your next appointment:   6 month(s)  The format for your next appointment:   In Person  Provider:   DR. Shari Prows   Important Information About Sugar

## 2022-03-04 ENCOUNTER — Ambulatory Visit (HOSPITAL_COMMUNITY): Payer: Medicare HMO | Attending: Cardiology

## 2022-03-04 DIAGNOSIS — I350 Nonrheumatic aortic (valve) stenosis: Secondary | ICD-10-CM

## 2022-03-04 LAB — ECHOCARDIOGRAM COMPLETE
AR max vel: 1.43 cm2
AV Area VTI: 1.45 cm2
AV Area mean vel: 1.44 cm2
AV Mean grad: 12 mmHg
AV Peak grad: 21.6 mmHg
Ao pk vel: 2.33 m/s
Area-P 1/2: 1.78 cm2
S' Lateral: 2.4 cm

## 2022-05-07 DIAGNOSIS — I69959 Hemiplegia and hemiparesis following unspecified cerebrovascular disease affecting unspecified side: Secondary | ICD-10-CM | POA: Diagnosis not present

## 2022-05-07 DIAGNOSIS — R238 Other skin changes: Secondary | ICD-10-CM | POA: Diagnosis not present

## 2022-05-07 DIAGNOSIS — N390 Urinary tract infection, site not specified: Secondary | ICD-10-CM | POA: Diagnosis not present

## 2022-05-07 DIAGNOSIS — Z23 Encounter for immunization: Secondary | ICD-10-CM | POA: Diagnosis not present

## 2022-05-07 DIAGNOSIS — G35 Multiple sclerosis: Secondary | ICD-10-CM | POA: Diagnosis not present

## 2022-05-07 DIAGNOSIS — R531 Weakness: Secondary | ICD-10-CM | POA: Diagnosis not present

## 2022-05-07 DIAGNOSIS — M5030 Other cervical disc degeneration, unspecified cervical region: Secondary | ICD-10-CM | POA: Diagnosis not present

## 2022-05-07 DIAGNOSIS — R413 Other amnesia: Secondary | ICD-10-CM | POA: Diagnosis not present

## 2022-05-19 DIAGNOSIS — Z87891 Personal history of nicotine dependence: Secondary | ICD-10-CM | POA: Diagnosis not present

## 2022-05-19 DIAGNOSIS — M5030 Other cervical disc degeneration, unspecified cervical region: Secondary | ICD-10-CM | POA: Diagnosis not present

## 2022-05-19 DIAGNOSIS — F39 Unspecified mood [affective] disorder: Secondary | ICD-10-CM | POA: Diagnosis not present

## 2022-05-19 DIAGNOSIS — D649 Anemia, unspecified: Secondary | ICD-10-CM | POA: Diagnosis not present

## 2022-05-19 DIAGNOSIS — I10 Essential (primary) hypertension: Secondary | ICD-10-CM | POA: Diagnosis not present

## 2022-05-19 DIAGNOSIS — Z9181 History of falling: Secondary | ICD-10-CM | POA: Diagnosis not present

## 2022-05-19 DIAGNOSIS — I69351 Hemiplegia and hemiparesis following cerebral infarction affecting right dominant side: Secondary | ICD-10-CM | POA: Diagnosis not present

## 2022-05-19 DIAGNOSIS — Z7901 Long term (current) use of anticoagulants: Secondary | ICD-10-CM | POA: Diagnosis not present

## 2022-05-19 DIAGNOSIS — Z8781 Personal history of (healed) traumatic fracture: Secondary | ICD-10-CM | POA: Diagnosis not present

## 2022-05-19 DIAGNOSIS — Z7982 Long term (current) use of aspirin: Secondary | ICD-10-CM | POA: Diagnosis not present

## 2022-05-19 DIAGNOSIS — I2510 Atherosclerotic heart disease of native coronary artery without angina pectoris: Secondary | ICD-10-CM | POA: Diagnosis not present

## 2022-05-19 DIAGNOSIS — Z79899 Other long term (current) drug therapy: Secondary | ICD-10-CM | POA: Diagnosis not present

## 2022-05-19 DIAGNOSIS — I480 Paroxysmal atrial fibrillation: Secondary | ICD-10-CM | POA: Diagnosis not present

## 2022-05-19 DIAGNOSIS — G35 Multiple sclerosis: Secondary | ICD-10-CM | POA: Diagnosis not present

## 2022-05-19 DIAGNOSIS — M8580 Other specified disorders of bone density and structure, unspecified site: Secondary | ICD-10-CM | POA: Diagnosis not present

## 2022-05-19 DIAGNOSIS — E785 Hyperlipidemia, unspecified: Secondary | ICD-10-CM | POA: Diagnosis not present

## 2022-05-19 DIAGNOSIS — N390 Urinary tract infection, site not specified: Secondary | ICD-10-CM | POA: Diagnosis not present

## 2022-05-19 DIAGNOSIS — R413 Other amnesia: Secondary | ICD-10-CM | POA: Diagnosis not present

## 2022-05-27 DIAGNOSIS — G35 Multiple sclerosis: Secondary | ICD-10-CM | POA: Diagnosis not present

## 2022-05-27 DIAGNOSIS — R413 Other amnesia: Secondary | ICD-10-CM | POA: Diagnosis not present

## 2022-05-27 DIAGNOSIS — E785 Hyperlipidemia, unspecified: Secondary | ICD-10-CM | POA: Diagnosis not present

## 2022-05-27 DIAGNOSIS — Z8781 Personal history of (healed) traumatic fracture: Secondary | ICD-10-CM | POA: Diagnosis not present

## 2022-05-27 DIAGNOSIS — M5030 Other cervical disc degeneration, unspecified cervical region: Secondary | ICD-10-CM | POA: Diagnosis not present

## 2022-05-27 DIAGNOSIS — I10 Essential (primary) hypertension: Secondary | ICD-10-CM | POA: Diagnosis not present

## 2022-05-27 DIAGNOSIS — I2510 Atherosclerotic heart disease of native coronary artery without angina pectoris: Secondary | ICD-10-CM | POA: Diagnosis not present

## 2022-05-27 DIAGNOSIS — F39 Unspecified mood [affective] disorder: Secondary | ICD-10-CM | POA: Diagnosis not present

## 2022-05-27 DIAGNOSIS — M8580 Other specified disorders of bone density and structure, unspecified site: Secondary | ICD-10-CM | POA: Diagnosis not present

## 2022-05-27 DIAGNOSIS — Z7901 Long term (current) use of anticoagulants: Secondary | ICD-10-CM | POA: Diagnosis not present

## 2022-05-27 DIAGNOSIS — D649 Anemia, unspecified: Secondary | ICD-10-CM | POA: Diagnosis not present

## 2022-05-27 DIAGNOSIS — Z79899 Other long term (current) drug therapy: Secondary | ICD-10-CM | POA: Diagnosis not present

## 2022-05-27 DIAGNOSIS — Z7982 Long term (current) use of aspirin: Secondary | ICD-10-CM | POA: Diagnosis not present

## 2022-05-27 DIAGNOSIS — I480 Paroxysmal atrial fibrillation: Secondary | ICD-10-CM | POA: Diagnosis not present

## 2022-05-27 DIAGNOSIS — Z87891 Personal history of nicotine dependence: Secondary | ICD-10-CM | POA: Diagnosis not present

## 2022-05-27 DIAGNOSIS — I69351 Hemiplegia and hemiparesis following cerebral infarction affecting right dominant side: Secondary | ICD-10-CM | POA: Diagnosis not present

## 2022-05-27 DIAGNOSIS — N390 Urinary tract infection, site not specified: Secondary | ICD-10-CM | POA: Diagnosis not present

## 2022-05-27 DIAGNOSIS — Z9181 History of falling: Secondary | ICD-10-CM | POA: Diagnosis not present

## 2022-05-29 DIAGNOSIS — M8580 Other specified disorders of bone density and structure, unspecified site: Secondary | ICD-10-CM | POA: Diagnosis not present

## 2022-05-29 DIAGNOSIS — Z8781 Personal history of (healed) traumatic fracture: Secondary | ICD-10-CM | POA: Diagnosis not present

## 2022-05-29 DIAGNOSIS — D649 Anemia, unspecified: Secondary | ICD-10-CM | POA: Diagnosis not present

## 2022-05-29 DIAGNOSIS — R413 Other amnesia: Secondary | ICD-10-CM | POA: Diagnosis not present

## 2022-05-29 DIAGNOSIS — Z79899 Other long term (current) drug therapy: Secondary | ICD-10-CM | POA: Diagnosis not present

## 2022-05-29 DIAGNOSIS — I10 Essential (primary) hypertension: Secondary | ICD-10-CM | POA: Diagnosis not present

## 2022-05-29 DIAGNOSIS — Z87891 Personal history of nicotine dependence: Secondary | ICD-10-CM | POA: Diagnosis not present

## 2022-05-29 DIAGNOSIS — N390 Urinary tract infection, site not specified: Secondary | ICD-10-CM | POA: Diagnosis not present

## 2022-05-29 DIAGNOSIS — M5030 Other cervical disc degeneration, unspecified cervical region: Secondary | ICD-10-CM | POA: Diagnosis not present

## 2022-05-29 DIAGNOSIS — Z7982 Long term (current) use of aspirin: Secondary | ICD-10-CM | POA: Diagnosis not present

## 2022-05-29 DIAGNOSIS — Z7901 Long term (current) use of anticoagulants: Secondary | ICD-10-CM | POA: Diagnosis not present

## 2022-05-29 DIAGNOSIS — Z9181 History of falling: Secondary | ICD-10-CM | POA: Diagnosis not present

## 2022-05-29 DIAGNOSIS — I2510 Atherosclerotic heart disease of native coronary artery without angina pectoris: Secondary | ICD-10-CM | POA: Diagnosis not present

## 2022-05-29 DIAGNOSIS — F39 Unspecified mood [affective] disorder: Secondary | ICD-10-CM | POA: Diagnosis not present

## 2022-05-29 DIAGNOSIS — G35 Multiple sclerosis: Secondary | ICD-10-CM | POA: Diagnosis not present

## 2022-05-29 DIAGNOSIS — I69351 Hemiplegia and hemiparesis following cerebral infarction affecting right dominant side: Secondary | ICD-10-CM | POA: Diagnosis not present

## 2022-05-29 DIAGNOSIS — I480 Paroxysmal atrial fibrillation: Secondary | ICD-10-CM | POA: Diagnosis not present

## 2022-05-29 DIAGNOSIS — E785 Hyperlipidemia, unspecified: Secondary | ICD-10-CM | POA: Diagnosis not present

## 2022-05-31 DIAGNOSIS — I480 Paroxysmal atrial fibrillation: Secondary | ICD-10-CM | POA: Diagnosis not present

## 2022-05-31 DIAGNOSIS — I69351 Hemiplegia and hemiparesis following cerebral infarction affecting right dominant side: Secondary | ICD-10-CM | POA: Diagnosis not present

## 2022-05-31 DIAGNOSIS — Z7982 Long term (current) use of aspirin: Secondary | ICD-10-CM | POA: Diagnosis not present

## 2022-05-31 DIAGNOSIS — M5030 Other cervical disc degeneration, unspecified cervical region: Secondary | ICD-10-CM | POA: Diagnosis not present

## 2022-05-31 DIAGNOSIS — M6281 Muscle weakness (generalized): Secondary | ICD-10-CM | POA: Diagnosis not present

## 2022-05-31 DIAGNOSIS — I10 Essential (primary) hypertension: Secondary | ICD-10-CM | POA: Diagnosis not present

## 2022-05-31 DIAGNOSIS — Z7901 Long term (current) use of anticoagulants: Secondary | ICD-10-CM | POA: Diagnosis not present

## 2022-05-31 DIAGNOSIS — Z87891 Personal history of nicotine dependence: Secondary | ICD-10-CM | POA: Diagnosis not present

## 2022-05-31 DIAGNOSIS — F39 Unspecified mood [affective] disorder: Secondary | ICD-10-CM | POA: Diagnosis not present

## 2022-05-31 DIAGNOSIS — M8580 Other specified disorders of bone density and structure, unspecified site: Secondary | ICD-10-CM | POA: Diagnosis not present

## 2022-05-31 DIAGNOSIS — D649 Anemia, unspecified: Secondary | ICD-10-CM | POA: Diagnosis not present

## 2022-05-31 DIAGNOSIS — R269 Unspecified abnormalities of gait and mobility: Secondary | ICD-10-CM | POA: Diagnosis not present

## 2022-05-31 DIAGNOSIS — R413 Other amnesia: Secondary | ICD-10-CM | POA: Diagnosis not present

## 2022-05-31 DIAGNOSIS — Z9181 History of falling: Secondary | ICD-10-CM | POA: Diagnosis not present

## 2022-05-31 DIAGNOSIS — Z79899 Other long term (current) drug therapy: Secondary | ICD-10-CM | POA: Diagnosis not present

## 2022-05-31 DIAGNOSIS — E785 Hyperlipidemia, unspecified: Secondary | ICD-10-CM | POA: Diagnosis not present

## 2022-05-31 DIAGNOSIS — N390 Urinary tract infection, site not specified: Secondary | ICD-10-CM | POA: Diagnosis not present

## 2022-05-31 DIAGNOSIS — G35 Multiple sclerosis: Secondary | ICD-10-CM | POA: Diagnosis not present

## 2022-05-31 DIAGNOSIS — Z8781 Personal history of (healed) traumatic fracture: Secondary | ICD-10-CM | POA: Diagnosis not present

## 2022-05-31 DIAGNOSIS — I2510 Atherosclerotic heart disease of native coronary artery without angina pectoris: Secondary | ICD-10-CM | POA: Diagnosis not present

## 2022-06-02 DIAGNOSIS — I480 Paroxysmal atrial fibrillation: Secondary | ICD-10-CM | POA: Diagnosis not present

## 2022-06-02 DIAGNOSIS — Z8781 Personal history of (healed) traumatic fracture: Secondary | ICD-10-CM | POA: Diagnosis not present

## 2022-06-02 DIAGNOSIS — N390 Urinary tract infection, site not specified: Secondary | ICD-10-CM | POA: Diagnosis not present

## 2022-06-02 DIAGNOSIS — D649 Anemia, unspecified: Secondary | ICD-10-CM | POA: Diagnosis not present

## 2022-06-02 DIAGNOSIS — Z79899 Other long term (current) drug therapy: Secondary | ICD-10-CM | POA: Diagnosis not present

## 2022-06-02 DIAGNOSIS — Z87891 Personal history of nicotine dependence: Secondary | ICD-10-CM | POA: Diagnosis not present

## 2022-06-02 DIAGNOSIS — I2510 Atherosclerotic heart disease of native coronary artery without angina pectoris: Secondary | ICD-10-CM | POA: Diagnosis not present

## 2022-06-02 DIAGNOSIS — I10 Essential (primary) hypertension: Secondary | ICD-10-CM | POA: Diagnosis not present

## 2022-06-02 DIAGNOSIS — M8580 Other specified disorders of bone density and structure, unspecified site: Secondary | ICD-10-CM | POA: Diagnosis not present

## 2022-06-02 DIAGNOSIS — Z7901 Long term (current) use of anticoagulants: Secondary | ICD-10-CM | POA: Diagnosis not present

## 2022-06-02 DIAGNOSIS — I69351 Hemiplegia and hemiparesis following cerebral infarction affecting right dominant side: Secondary | ICD-10-CM | POA: Diagnosis not present

## 2022-06-02 DIAGNOSIS — E785 Hyperlipidemia, unspecified: Secondary | ICD-10-CM | POA: Diagnosis not present

## 2022-06-02 DIAGNOSIS — R413 Other amnesia: Secondary | ICD-10-CM | POA: Diagnosis not present

## 2022-06-02 DIAGNOSIS — M5030 Other cervical disc degeneration, unspecified cervical region: Secondary | ICD-10-CM | POA: Diagnosis not present

## 2022-06-02 DIAGNOSIS — Z9181 History of falling: Secondary | ICD-10-CM | POA: Diagnosis not present

## 2022-06-02 DIAGNOSIS — G35 Multiple sclerosis: Secondary | ICD-10-CM | POA: Diagnosis not present

## 2022-06-02 DIAGNOSIS — Z7982 Long term (current) use of aspirin: Secondary | ICD-10-CM | POA: Diagnosis not present

## 2022-06-02 DIAGNOSIS — F39 Unspecified mood [affective] disorder: Secondary | ICD-10-CM | POA: Diagnosis not present

## 2022-06-04 DIAGNOSIS — Z79899 Other long term (current) drug therapy: Secondary | ICD-10-CM | POA: Diagnosis not present

## 2022-06-04 DIAGNOSIS — Z7901 Long term (current) use of anticoagulants: Secondary | ICD-10-CM | POA: Diagnosis not present

## 2022-06-04 DIAGNOSIS — Z9181 History of falling: Secondary | ICD-10-CM | POA: Diagnosis not present

## 2022-06-04 DIAGNOSIS — I10 Essential (primary) hypertension: Secondary | ICD-10-CM | POA: Diagnosis not present

## 2022-06-04 DIAGNOSIS — Z7982 Long term (current) use of aspirin: Secondary | ICD-10-CM | POA: Diagnosis not present

## 2022-06-04 DIAGNOSIS — I69351 Hemiplegia and hemiparesis following cerebral infarction affecting right dominant side: Secondary | ICD-10-CM | POA: Diagnosis not present

## 2022-06-04 DIAGNOSIS — D649 Anemia, unspecified: Secondary | ICD-10-CM | POA: Diagnosis not present

## 2022-06-04 DIAGNOSIS — Z87891 Personal history of nicotine dependence: Secondary | ICD-10-CM | POA: Diagnosis not present

## 2022-06-04 DIAGNOSIS — Z8781 Personal history of (healed) traumatic fracture: Secondary | ICD-10-CM | POA: Diagnosis not present

## 2022-06-04 DIAGNOSIS — R413 Other amnesia: Secondary | ICD-10-CM | POA: Diagnosis not present

## 2022-06-04 DIAGNOSIS — N390 Urinary tract infection, site not specified: Secondary | ICD-10-CM | POA: Diagnosis not present

## 2022-06-04 DIAGNOSIS — F39 Unspecified mood [affective] disorder: Secondary | ICD-10-CM | POA: Diagnosis not present

## 2022-06-04 DIAGNOSIS — G35 Multiple sclerosis: Secondary | ICD-10-CM | POA: Diagnosis not present

## 2022-06-04 DIAGNOSIS — I480 Paroxysmal atrial fibrillation: Secondary | ICD-10-CM | POA: Diagnosis not present

## 2022-06-04 DIAGNOSIS — E785 Hyperlipidemia, unspecified: Secondary | ICD-10-CM | POA: Diagnosis not present

## 2022-06-04 DIAGNOSIS — M5030 Other cervical disc degeneration, unspecified cervical region: Secondary | ICD-10-CM | POA: Diagnosis not present

## 2022-06-04 DIAGNOSIS — M8580 Other specified disorders of bone density and structure, unspecified site: Secondary | ICD-10-CM | POA: Diagnosis not present

## 2022-06-04 DIAGNOSIS — I2510 Atherosclerotic heart disease of native coronary artery without angina pectoris: Secondary | ICD-10-CM | POA: Diagnosis not present

## 2022-06-07 DIAGNOSIS — Z9181 History of falling: Secondary | ICD-10-CM | POA: Diagnosis not present

## 2022-06-07 DIAGNOSIS — Z7901 Long term (current) use of anticoagulants: Secondary | ICD-10-CM | POA: Diagnosis not present

## 2022-06-07 DIAGNOSIS — I480 Paroxysmal atrial fibrillation: Secondary | ICD-10-CM | POA: Diagnosis not present

## 2022-06-07 DIAGNOSIS — Z7982 Long term (current) use of aspirin: Secondary | ICD-10-CM | POA: Diagnosis not present

## 2022-06-07 DIAGNOSIS — Z79899 Other long term (current) drug therapy: Secondary | ICD-10-CM | POA: Diagnosis not present

## 2022-06-07 DIAGNOSIS — I69351 Hemiplegia and hemiparesis following cerebral infarction affecting right dominant side: Secondary | ICD-10-CM | POA: Diagnosis not present

## 2022-06-07 DIAGNOSIS — M8580 Other specified disorders of bone density and structure, unspecified site: Secondary | ICD-10-CM | POA: Diagnosis not present

## 2022-06-07 DIAGNOSIS — Z87891 Personal history of nicotine dependence: Secondary | ICD-10-CM | POA: Diagnosis not present

## 2022-06-07 DIAGNOSIS — R413 Other amnesia: Secondary | ICD-10-CM | POA: Diagnosis not present

## 2022-06-07 DIAGNOSIS — N390 Urinary tract infection, site not specified: Secondary | ICD-10-CM | POA: Diagnosis not present

## 2022-06-07 DIAGNOSIS — G35 Multiple sclerosis: Secondary | ICD-10-CM | POA: Diagnosis not present

## 2022-06-07 DIAGNOSIS — M5030 Other cervical disc degeneration, unspecified cervical region: Secondary | ICD-10-CM | POA: Diagnosis not present

## 2022-06-07 DIAGNOSIS — I10 Essential (primary) hypertension: Secondary | ICD-10-CM | POA: Diagnosis not present

## 2022-06-07 DIAGNOSIS — Z8781 Personal history of (healed) traumatic fracture: Secondary | ICD-10-CM | POA: Diagnosis not present

## 2022-06-07 DIAGNOSIS — F39 Unspecified mood [affective] disorder: Secondary | ICD-10-CM | POA: Diagnosis not present

## 2022-06-07 DIAGNOSIS — D649 Anemia, unspecified: Secondary | ICD-10-CM | POA: Diagnosis not present

## 2022-06-07 DIAGNOSIS — I2510 Atherosclerotic heart disease of native coronary artery without angina pectoris: Secondary | ICD-10-CM | POA: Diagnosis not present

## 2022-06-07 DIAGNOSIS — E785 Hyperlipidemia, unspecified: Secondary | ICD-10-CM | POA: Diagnosis not present

## 2022-06-09 DIAGNOSIS — R413 Other amnesia: Secondary | ICD-10-CM | POA: Diagnosis not present

## 2022-06-09 DIAGNOSIS — F39 Unspecified mood [affective] disorder: Secondary | ICD-10-CM | POA: Diagnosis not present

## 2022-06-09 DIAGNOSIS — D649 Anemia, unspecified: Secondary | ICD-10-CM | POA: Diagnosis not present

## 2022-06-09 DIAGNOSIS — Z8781 Personal history of (healed) traumatic fracture: Secondary | ICD-10-CM | POA: Diagnosis not present

## 2022-06-09 DIAGNOSIS — Z9181 History of falling: Secondary | ICD-10-CM | POA: Diagnosis not present

## 2022-06-09 DIAGNOSIS — I10 Essential (primary) hypertension: Secondary | ICD-10-CM | POA: Diagnosis not present

## 2022-06-09 DIAGNOSIS — Z87891 Personal history of nicotine dependence: Secondary | ICD-10-CM | POA: Diagnosis not present

## 2022-06-09 DIAGNOSIS — I480 Paroxysmal atrial fibrillation: Secondary | ICD-10-CM | POA: Diagnosis not present

## 2022-06-09 DIAGNOSIS — N390 Urinary tract infection, site not specified: Secondary | ICD-10-CM | POA: Diagnosis not present

## 2022-06-09 DIAGNOSIS — M8580 Other specified disorders of bone density and structure, unspecified site: Secondary | ICD-10-CM | POA: Diagnosis not present

## 2022-06-09 DIAGNOSIS — I2510 Atherosclerotic heart disease of native coronary artery without angina pectoris: Secondary | ICD-10-CM | POA: Diagnosis not present

## 2022-06-09 DIAGNOSIS — G35 Multiple sclerosis: Secondary | ICD-10-CM | POA: Diagnosis not present

## 2022-06-09 DIAGNOSIS — Z79899 Other long term (current) drug therapy: Secondary | ICD-10-CM | POA: Diagnosis not present

## 2022-06-09 DIAGNOSIS — M5030 Other cervical disc degeneration, unspecified cervical region: Secondary | ICD-10-CM | POA: Diagnosis not present

## 2022-06-09 DIAGNOSIS — Z7982 Long term (current) use of aspirin: Secondary | ICD-10-CM | POA: Diagnosis not present

## 2022-06-09 DIAGNOSIS — Z7901 Long term (current) use of anticoagulants: Secondary | ICD-10-CM | POA: Diagnosis not present

## 2022-06-09 DIAGNOSIS — I69351 Hemiplegia and hemiparesis following cerebral infarction affecting right dominant side: Secondary | ICD-10-CM | POA: Diagnosis not present

## 2022-06-09 DIAGNOSIS — E785 Hyperlipidemia, unspecified: Secondary | ICD-10-CM | POA: Diagnosis not present

## 2022-06-11 DIAGNOSIS — Z7982 Long term (current) use of aspirin: Secondary | ICD-10-CM | POA: Diagnosis not present

## 2022-06-11 DIAGNOSIS — Z8781 Personal history of (healed) traumatic fracture: Secondary | ICD-10-CM | POA: Diagnosis not present

## 2022-06-11 DIAGNOSIS — I10 Essential (primary) hypertension: Secondary | ICD-10-CM | POA: Diagnosis not present

## 2022-06-11 DIAGNOSIS — R413 Other amnesia: Secondary | ICD-10-CM | POA: Diagnosis not present

## 2022-06-11 DIAGNOSIS — Z9181 History of falling: Secondary | ICD-10-CM | POA: Diagnosis not present

## 2022-06-11 DIAGNOSIS — M8580 Other specified disorders of bone density and structure, unspecified site: Secondary | ICD-10-CM | POA: Diagnosis not present

## 2022-06-11 DIAGNOSIS — M5030 Other cervical disc degeneration, unspecified cervical region: Secondary | ICD-10-CM | POA: Diagnosis not present

## 2022-06-11 DIAGNOSIS — I2510 Atherosclerotic heart disease of native coronary artery without angina pectoris: Secondary | ICD-10-CM | POA: Diagnosis not present

## 2022-06-11 DIAGNOSIS — Z7901 Long term (current) use of anticoagulants: Secondary | ICD-10-CM | POA: Diagnosis not present

## 2022-06-11 DIAGNOSIS — N390 Urinary tract infection, site not specified: Secondary | ICD-10-CM | POA: Diagnosis not present

## 2022-06-11 DIAGNOSIS — D649 Anemia, unspecified: Secondary | ICD-10-CM | POA: Diagnosis not present

## 2022-06-11 DIAGNOSIS — E785 Hyperlipidemia, unspecified: Secondary | ICD-10-CM | POA: Diagnosis not present

## 2022-06-11 DIAGNOSIS — Z79899 Other long term (current) drug therapy: Secondary | ICD-10-CM | POA: Diagnosis not present

## 2022-06-11 DIAGNOSIS — Z87891 Personal history of nicotine dependence: Secondary | ICD-10-CM | POA: Diagnosis not present

## 2022-06-11 DIAGNOSIS — F39 Unspecified mood [affective] disorder: Secondary | ICD-10-CM | POA: Diagnosis not present

## 2022-06-11 DIAGNOSIS — I69351 Hemiplegia and hemiparesis following cerebral infarction affecting right dominant side: Secondary | ICD-10-CM | POA: Diagnosis not present

## 2022-06-11 DIAGNOSIS — I480 Paroxysmal atrial fibrillation: Secondary | ICD-10-CM | POA: Diagnosis not present

## 2022-06-11 DIAGNOSIS — G35 Multiple sclerosis: Secondary | ICD-10-CM | POA: Diagnosis not present

## 2022-06-15 ENCOUNTER — Other Ambulatory Visit: Payer: Self-pay | Admitting: *Deleted

## 2022-06-15 ENCOUNTER — Other Ambulatory Visit: Payer: Self-pay | Admitting: Pharmacist

## 2022-06-15 DIAGNOSIS — Z9181 History of falling: Secondary | ICD-10-CM | POA: Diagnosis not present

## 2022-06-15 DIAGNOSIS — I480 Paroxysmal atrial fibrillation: Secondary | ICD-10-CM | POA: Diagnosis not present

## 2022-06-15 DIAGNOSIS — R413 Other amnesia: Secondary | ICD-10-CM | POA: Diagnosis not present

## 2022-06-15 DIAGNOSIS — M5030 Other cervical disc degeneration, unspecified cervical region: Secondary | ICD-10-CM | POA: Diagnosis not present

## 2022-06-15 DIAGNOSIS — I69351 Hemiplegia and hemiparesis following cerebral infarction affecting right dominant side: Secondary | ICD-10-CM | POA: Diagnosis not present

## 2022-06-15 DIAGNOSIS — M8580 Other specified disorders of bone density and structure, unspecified site: Secondary | ICD-10-CM | POA: Diagnosis not present

## 2022-06-15 DIAGNOSIS — I2510 Atherosclerotic heart disease of native coronary artery without angina pectoris: Secondary | ICD-10-CM | POA: Diagnosis not present

## 2022-06-15 DIAGNOSIS — E785 Hyperlipidemia, unspecified: Secondary | ICD-10-CM | POA: Diagnosis not present

## 2022-06-15 DIAGNOSIS — Z7901 Long term (current) use of anticoagulants: Secondary | ICD-10-CM | POA: Diagnosis not present

## 2022-06-15 DIAGNOSIS — Z8781 Personal history of (healed) traumatic fracture: Secondary | ICD-10-CM | POA: Diagnosis not present

## 2022-06-15 DIAGNOSIS — F39 Unspecified mood [affective] disorder: Secondary | ICD-10-CM | POA: Diagnosis not present

## 2022-06-15 DIAGNOSIS — Z79899 Other long term (current) drug therapy: Secondary | ICD-10-CM | POA: Diagnosis not present

## 2022-06-15 DIAGNOSIS — Z789 Other specified health status: Secondary | ICD-10-CM

## 2022-06-15 DIAGNOSIS — I10 Essential (primary) hypertension: Secondary | ICD-10-CM | POA: Diagnosis not present

## 2022-06-15 DIAGNOSIS — Z7982 Long term (current) use of aspirin: Secondary | ICD-10-CM | POA: Diagnosis not present

## 2022-06-15 DIAGNOSIS — R0609 Other forms of dyspnea: Secondary | ICD-10-CM

## 2022-06-15 DIAGNOSIS — Z87891 Personal history of nicotine dependence: Secondary | ICD-10-CM | POA: Diagnosis not present

## 2022-06-15 DIAGNOSIS — N390 Urinary tract infection, site not specified: Secondary | ICD-10-CM | POA: Diagnosis not present

## 2022-06-15 DIAGNOSIS — R600 Localized edema: Secondary | ICD-10-CM

## 2022-06-15 DIAGNOSIS — D649 Anemia, unspecified: Secondary | ICD-10-CM | POA: Diagnosis not present

## 2022-06-15 DIAGNOSIS — G35 Multiple sclerosis: Secondary | ICD-10-CM | POA: Diagnosis not present

## 2022-06-15 DIAGNOSIS — E782 Mixed hyperlipidemia: Secondary | ICD-10-CM

## 2022-06-15 MED ORDER — PRALUENT 150 MG/ML ~~LOC~~ SOAJ: 150.0000 mg | SUBCUTANEOUS | 11 refills | Status: DC

## 2022-06-15 MED ORDER — LISINOPRIL 2.5 MG PO TABS: 2.5000 mg | ORAL_TABLET | Freq: Every day | ORAL | 2 refills | Status: DC

## 2022-06-17 DIAGNOSIS — I69351 Hemiplegia and hemiparesis following cerebral infarction affecting right dominant side: Secondary | ICD-10-CM | POA: Diagnosis not present

## 2022-06-17 DIAGNOSIS — N390 Urinary tract infection, site not specified: Secondary | ICD-10-CM | POA: Diagnosis not present

## 2022-06-17 DIAGNOSIS — Z87891 Personal history of nicotine dependence: Secondary | ICD-10-CM | POA: Diagnosis not present

## 2022-06-17 DIAGNOSIS — M5030 Other cervical disc degeneration, unspecified cervical region: Secondary | ICD-10-CM | POA: Diagnosis not present

## 2022-06-17 DIAGNOSIS — Z8781 Personal history of (healed) traumatic fracture: Secondary | ICD-10-CM | POA: Diagnosis not present

## 2022-06-17 DIAGNOSIS — D649 Anemia, unspecified: Secondary | ICD-10-CM | POA: Diagnosis not present

## 2022-06-17 DIAGNOSIS — Z79899 Other long term (current) drug therapy: Secondary | ICD-10-CM | POA: Diagnosis not present

## 2022-06-17 DIAGNOSIS — E785 Hyperlipidemia, unspecified: Secondary | ICD-10-CM | POA: Diagnosis not present

## 2022-06-17 DIAGNOSIS — Z9181 History of falling: Secondary | ICD-10-CM | POA: Diagnosis not present

## 2022-06-17 DIAGNOSIS — G35 Multiple sclerosis: Secondary | ICD-10-CM | POA: Diagnosis not present

## 2022-06-17 DIAGNOSIS — I480 Paroxysmal atrial fibrillation: Secondary | ICD-10-CM | POA: Diagnosis not present

## 2022-06-17 DIAGNOSIS — Z7982 Long term (current) use of aspirin: Secondary | ICD-10-CM | POA: Diagnosis not present

## 2022-06-17 DIAGNOSIS — M8580 Other specified disorders of bone density and structure, unspecified site: Secondary | ICD-10-CM | POA: Diagnosis not present

## 2022-06-17 DIAGNOSIS — Z7901 Long term (current) use of anticoagulants: Secondary | ICD-10-CM | POA: Diagnosis not present

## 2022-06-17 DIAGNOSIS — R413 Other amnesia: Secondary | ICD-10-CM | POA: Diagnosis not present

## 2022-06-17 DIAGNOSIS — I2510 Atherosclerotic heart disease of native coronary artery without angina pectoris: Secondary | ICD-10-CM | POA: Diagnosis not present

## 2022-06-17 DIAGNOSIS — F39 Unspecified mood [affective] disorder: Secondary | ICD-10-CM | POA: Diagnosis not present

## 2022-06-17 DIAGNOSIS — I10 Essential (primary) hypertension: Secondary | ICD-10-CM | POA: Diagnosis not present

## 2022-06-18 DIAGNOSIS — F39 Unspecified mood [affective] disorder: Secondary | ICD-10-CM | POA: Diagnosis not present

## 2022-06-18 DIAGNOSIS — M5030 Other cervical disc degeneration, unspecified cervical region: Secondary | ICD-10-CM | POA: Diagnosis not present

## 2022-06-18 DIAGNOSIS — Z8781 Personal history of (healed) traumatic fracture: Secondary | ICD-10-CM | POA: Diagnosis not present

## 2022-06-18 DIAGNOSIS — Z87891 Personal history of nicotine dependence: Secondary | ICD-10-CM | POA: Diagnosis not present

## 2022-06-18 DIAGNOSIS — Z9181 History of falling: Secondary | ICD-10-CM | POA: Diagnosis not present

## 2022-06-18 DIAGNOSIS — I69351 Hemiplegia and hemiparesis following cerebral infarction affecting right dominant side: Secondary | ICD-10-CM | POA: Diagnosis not present

## 2022-06-18 DIAGNOSIS — I480 Paroxysmal atrial fibrillation: Secondary | ICD-10-CM | POA: Diagnosis not present

## 2022-06-18 DIAGNOSIS — R413 Other amnesia: Secondary | ICD-10-CM | POA: Diagnosis not present

## 2022-06-18 DIAGNOSIS — Z79899 Other long term (current) drug therapy: Secondary | ICD-10-CM | POA: Diagnosis not present

## 2022-06-18 DIAGNOSIS — G35 Multiple sclerosis: Secondary | ICD-10-CM | POA: Diagnosis not present

## 2022-06-18 DIAGNOSIS — I2510 Atherosclerotic heart disease of native coronary artery without angina pectoris: Secondary | ICD-10-CM | POA: Diagnosis not present

## 2022-06-18 DIAGNOSIS — Z7982 Long term (current) use of aspirin: Secondary | ICD-10-CM | POA: Diagnosis not present

## 2022-06-18 DIAGNOSIS — Z7901 Long term (current) use of anticoagulants: Secondary | ICD-10-CM | POA: Diagnosis not present

## 2022-06-18 DIAGNOSIS — M8580 Other specified disorders of bone density and structure, unspecified site: Secondary | ICD-10-CM | POA: Diagnosis not present

## 2022-06-18 DIAGNOSIS — E785 Hyperlipidemia, unspecified: Secondary | ICD-10-CM | POA: Diagnosis not present

## 2022-06-18 DIAGNOSIS — D649 Anemia, unspecified: Secondary | ICD-10-CM | POA: Diagnosis not present

## 2022-06-18 DIAGNOSIS — N390 Urinary tract infection, site not specified: Secondary | ICD-10-CM | POA: Diagnosis not present

## 2022-06-18 DIAGNOSIS — I10 Essential (primary) hypertension: Secondary | ICD-10-CM | POA: Diagnosis not present

## 2022-06-21 DIAGNOSIS — I10 Essential (primary) hypertension: Secondary | ICD-10-CM | POA: Diagnosis not present

## 2022-06-21 DIAGNOSIS — Z7982 Long term (current) use of aspirin: Secondary | ICD-10-CM | POA: Diagnosis not present

## 2022-06-21 DIAGNOSIS — Z79899 Other long term (current) drug therapy: Secondary | ICD-10-CM | POA: Diagnosis not present

## 2022-06-21 DIAGNOSIS — Z8781 Personal history of (healed) traumatic fracture: Secondary | ICD-10-CM | POA: Diagnosis not present

## 2022-06-21 DIAGNOSIS — Z87891 Personal history of nicotine dependence: Secondary | ICD-10-CM | POA: Diagnosis not present

## 2022-06-21 DIAGNOSIS — M5030 Other cervical disc degeneration, unspecified cervical region: Secondary | ICD-10-CM | POA: Diagnosis not present

## 2022-06-21 DIAGNOSIS — M8580 Other specified disorders of bone density and structure, unspecified site: Secondary | ICD-10-CM | POA: Diagnosis not present

## 2022-06-21 DIAGNOSIS — Z7901 Long term (current) use of anticoagulants: Secondary | ICD-10-CM | POA: Diagnosis not present

## 2022-06-21 DIAGNOSIS — R413 Other amnesia: Secondary | ICD-10-CM | POA: Diagnosis not present

## 2022-06-21 DIAGNOSIS — I69351 Hemiplegia and hemiparesis following cerebral infarction affecting right dominant side: Secondary | ICD-10-CM | POA: Diagnosis not present

## 2022-06-21 DIAGNOSIS — I480 Paroxysmal atrial fibrillation: Secondary | ICD-10-CM | POA: Diagnosis not present

## 2022-06-21 DIAGNOSIS — G35 Multiple sclerosis: Secondary | ICD-10-CM | POA: Diagnosis not present

## 2022-06-21 DIAGNOSIS — I2510 Atherosclerotic heart disease of native coronary artery without angina pectoris: Secondary | ICD-10-CM | POA: Diagnosis not present

## 2022-06-21 DIAGNOSIS — D649 Anemia, unspecified: Secondary | ICD-10-CM | POA: Diagnosis not present

## 2022-06-21 DIAGNOSIS — N390 Urinary tract infection, site not specified: Secondary | ICD-10-CM | POA: Diagnosis not present

## 2022-06-21 DIAGNOSIS — F39 Unspecified mood [affective] disorder: Secondary | ICD-10-CM | POA: Diagnosis not present

## 2022-06-21 DIAGNOSIS — Z9181 History of falling: Secondary | ICD-10-CM | POA: Diagnosis not present

## 2022-06-21 DIAGNOSIS — E785 Hyperlipidemia, unspecified: Secondary | ICD-10-CM | POA: Diagnosis not present

## 2022-06-23 DIAGNOSIS — Z9181 History of falling: Secondary | ICD-10-CM | POA: Diagnosis not present

## 2022-06-23 DIAGNOSIS — N390 Urinary tract infection, site not specified: Secondary | ICD-10-CM | POA: Diagnosis not present

## 2022-06-23 DIAGNOSIS — Z87891 Personal history of nicotine dependence: Secondary | ICD-10-CM | POA: Diagnosis not present

## 2022-06-23 DIAGNOSIS — I480 Paroxysmal atrial fibrillation: Secondary | ICD-10-CM | POA: Diagnosis not present

## 2022-06-23 DIAGNOSIS — M5030 Other cervical disc degeneration, unspecified cervical region: Secondary | ICD-10-CM | POA: Diagnosis not present

## 2022-06-23 DIAGNOSIS — E785 Hyperlipidemia, unspecified: Secondary | ICD-10-CM | POA: Diagnosis not present

## 2022-06-23 DIAGNOSIS — D649 Anemia, unspecified: Secondary | ICD-10-CM | POA: Diagnosis not present

## 2022-06-23 DIAGNOSIS — I10 Essential (primary) hypertension: Secondary | ICD-10-CM | POA: Diagnosis not present

## 2022-06-23 DIAGNOSIS — Z7901 Long term (current) use of anticoagulants: Secondary | ICD-10-CM | POA: Diagnosis not present

## 2022-06-23 DIAGNOSIS — Z8781 Personal history of (healed) traumatic fracture: Secondary | ICD-10-CM | POA: Diagnosis not present

## 2022-06-23 DIAGNOSIS — I69351 Hemiplegia and hemiparesis following cerebral infarction affecting right dominant side: Secondary | ICD-10-CM | POA: Diagnosis not present

## 2022-06-23 DIAGNOSIS — I2510 Atherosclerotic heart disease of native coronary artery without angina pectoris: Secondary | ICD-10-CM | POA: Diagnosis not present

## 2022-06-23 DIAGNOSIS — Z79899 Other long term (current) drug therapy: Secondary | ICD-10-CM | POA: Diagnosis not present

## 2022-06-23 DIAGNOSIS — G35 Multiple sclerosis: Secondary | ICD-10-CM | POA: Diagnosis not present

## 2022-06-23 DIAGNOSIS — Z7982 Long term (current) use of aspirin: Secondary | ICD-10-CM | POA: Diagnosis not present

## 2022-06-23 DIAGNOSIS — F39 Unspecified mood [affective] disorder: Secondary | ICD-10-CM | POA: Diagnosis not present

## 2022-06-23 DIAGNOSIS — R413 Other amnesia: Secondary | ICD-10-CM | POA: Diagnosis not present

## 2022-06-23 DIAGNOSIS — M8580 Other specified disorders of bone density and structure, unspecified site: Secondary | ICD-10-CM | POA: Diagnosis not present

## 2022-06-25 DIAGNOSIS — I69351 Hemiplegia and hemiparesis following cerebral infarction affecting right dominant side: Secondary | ICD-10-CM | POA: Diagnosis not present

## 2022-06-25 DIAGNOSIS — N390 Urinary tract infection, site not specified: Secondary | ICD-10-CM | POA: Diagnosis not present

## 2022-06-25 DIAGNOSIS — Z79899 Other long term (current) drug therapy: Secondary | ICD-10-CM | POA: Diagnosis not present

## 2022-06-25 DIAGNOSIS — M5030 Other cervical disc degeneration, unspecified cervical region: Secondary | ICD-10-CM | POA: Diagnosis not present

## 2022-06-25 DIAGNOSIS — E785 Hyperlipidemia, unspecified: Secondary | ICD-10-CM | POA: Diagnosis not present

## 2022-06-25 DIAGNOSIS — Z7901 Long term (current) use of anticoagulants: Secondary | ICD-10-CM | POA: Diagnosis not present

## 2022-06-25 DIAGNOSIS — I10 Essential (primary) hypertension: Secondary | ICD-10-CM | POA: Diagnosis not present

## 2022-06-25 DIAGNOSIS — I480 Paroxysmal atrial fibrillation: Secondary | ICD-10-CM | POA: Diagnosis not present

## 2022-06-25 DIAGNOSIS — G35 Multiple sclerosis: Secondary | ICD-10-CM | POA: Diagnosis not present

## 2022-06-25 DIAGNOSIS — D649 Anemia, unspecified: Secondary | ICD-10-CM | POA: Diagnosis not present

## 2022-06-25 DIAGNOSIS — M8580 Other specified disorders of bone density and structure, unspecified site: Secondary | ICD-10-CM | POA: Diagnosis not present

## 2022-06-25 DIAGNOSIS — Z9181 History of falling: Secondary | ICD-10-CM | POA: Diagnosis not present

## 2022-06-25 DIAGNOSIS — Z7982 Long term (current) use of aspirin: Secondary | ICD-10-CM | POA: Diagnosis not present

## 2022-06-25 DIAGNOSIS — I2510 Atherosclerotic heart disease of native coronary artery without angina pectoris: Secondary | ICD-10-CM | POA: Diagnosis not present

## 2022-06-25 DIAGNOSIS — Z8781 Personal history of (healed) traumatic fracture: Secondary | ICD-10-CM | POA: Diagnosis not present

## 2022-06-25 DIAGNOSIS — R413 Other amnesia: Secondary | ICD-10-CM | POA: Diagnosis not present

## 2022-06-25 DIAGNOSIS — Z87891 Personal history of nicotine dependence: Secondary | ICD-10-CM | POA: Diagnosis not present

## 2022-06-25 DIAGNOSIS — F39 Unspecified mood [affective] disorder: Secondary | ICD-10-CM | POA: Diagnosis not present

## 2022-06-28 DIAGNOSIS — Z9181 History of falling: Secondary | ICD-10-CM | POA: Diagnosis not present

## 2022-06-28 DIAGNOSIS — N390 Urinary tract infection, site not specified: Secondary | ICD-10-CM | POA: Diagnosis not present

## 2022-06-28 DIAGNOSIS — Z79899 Other long term (current) drug therapy: Secondary | ICD-10-CM | POA: Diagnosis not present

## 2022-06-28 DIAGNOSIS — Z87891 Personal history of nicotine dependence: Secondary | ICD-10-CM | POA: Diagnosis not present

## 2022-06-28 DIAGNOSIS — D649 Anemia, unspecified: Secondary | ICD-10-CM | POA: Diagnosis not present

## 2022-06-28 DIAGNOSIS — I69351 Hemiplegia and hemiparesis following cerebral infarction affecting right dominant side: Secondary | ICD-10-CM | POA: Diagnosis not present

## 2022-06-28 DIAGNOSIS — Z7901 Long term (current) use of anticoagulants: Secondary | ICD-10-CM | POA: Diagnosis not present

## 2022-06-28 DIAGNOSIS — F39 Unspecified mood [affective] disorder: Secondary | ICD-10-CM | POA: Diagnosis not present

## 2022-06-28 DIAGNOSIS — M8580 Other specified disorders of bone density and structure, unspecified site: Secondary | ICD-10-CM | POA: Diagnosis not present

## 2022-06-28 DIAGNOSIS — M5030 Other cervical disc degeneration, unspecified cervical region: Secondary | ICD-10-CM | POA: Diagnosis not present

## 2022-06-28 DIAGNOSIS — R413 Other amnesia: Secondary | ICD-10-CM | POA: Diagnosis not present

## 2022-06-28 DIAGNOSIS — Z7982 Long term (current) use of aspirin: Secondary | ICD-10-CM | POA: Diagnosis not present

## 2022-06-28 DIAGNOSIS — Z8781 Personal history of (healed) traumatic fracture: Secondary | ICD-10-CM | POA: Diagnosis not present

## 2022-06-28 DIAGNOSIS — I480 Paroxysmal atrial fibrillation: Secondary | ICD-10-CM | POA: Diagnosis not present

## 2022-06-28 DIAGNOSIS — E785 Hyperlipidemia, unspecified: Secondary | ICD-10-CM | POA: Diagnosis not present

## 2022-06-28 DIAGNOSIS — G35 Multiple sclerosis: Secondary | ICD-10-CM | POA: Diagnosis not present

## 2022-06-28 DIAGNOSIS — I2510 Atherosclerotic heart disease of native coronary artery without angina pectoris: Secondary | ICD-10-CM | POA: Diagnosis not present

## 2022-06-28 DIAGNOSIS — I10 Essential (primary) hypertension: Secondary | ICD-10-CM | POA: Diagnosis not present

## 2022-06-30 DIAGNOSIS — D649 Anemia, unspecified: Secondary | ICD-10-CM | POA: Diagnosis not present

## 2022-06-30 DIAGNOSIS — M8580 Other specified disorders of bone density and structure, unspecified site: Secondary | ICD-10-CM | POA: Diagnosis not present

## 2022-06-30 DIAGNOSIS — R413 Other amnesia: Secondary | ICD-10-CM | POA: Diagnosis not present

## 2022-06-30 DIAGNOSIS — F39 Unspecified mood [affective] disorder: Secondary | ICD-10-CM | POA: Diagnosis not present

## 2022-06-30 DIAGNOSIS — Z9181 History of falling: Secondary | ICD-10-CM | POA: Diagnosis not present

## 2022-06-30 DIAGNOSIS — E785 Hyperlipidemia, unspecified: Secondary | ICD-10-CM | POA: Diagnosis not present

## 2022-06-30 DIAGNOSIS — G35 Multiple sclerosis: Secondary | ICD-10-CM | POA: Diagnosis not present

## 2022-06-30 DIAGNOSIS — Z8781 Personal history of (healed) traumatic fracture: Secondary | ICD-10-CM | POA: Diagnosis not present

## 2022-06-30 DIAGNOSIS — Z7982 Long term (current) use of aspirin: Secondary | ICD-10-CM | POA: Diagnosis not present

## 2022-06-30 DIAGNOSIS — N390 Urinary tract infection, site not specified: Secondary | ICD-10-CM | POA: Diagnosis not present

## 2022-06-30 DIAGNOSIS — I69351 Hemiplegia and hemiparesis following cerebral infarction affecting right dominant side: Secondary | ICD-10-CM | POA: Diagnosis not present

## 2022-06-30 DIAGNOSIS — M5030 Other cervical disc degeneration, unspecified cervical region: Secondary | ICD-10-CM | POA: Diagnosis not present

## 2022-06-30 DIAGNOSIS — I480 Paroxysmal atrial fibrillation: Secondary | ICD-10-CM | POA: Diagnosis not present

## 2022-06-30 DIAGNOSIS — Z87891 Personal history of nicotine dependence: Secondary | ICD-10-CM | POA: Diagnosis not present

## 2022-06-30 DIAGNOSIS — I2510 Atherosclerotic heart disease of native coronary artery without angina pectoris: Secondary | ICD-10-CM | POA: Diagnosis not present

## 2022-06-30 DIAGNOSIS — I10 Essential (primary) hypertension: Secondary | ICD-10-CM | POA: Diagnosis not present

## 2022-06-30 DIAGNOSIS — Z7901 Long term (current) use of anticoagulants: Secondary | ICD-10-CM | POA: Diagnosis not present

## 2022-06-30 DIAGNOSIS — Z79899 Other long term (current) drug therapy: Secondary | ICD-10-CM | POA: Diagnosis not present

## 2022-07-01 DIAGNOSIS — M6281 Muscle weakness (generalized): Secondary | ICD-10-CM | POA: Diagnosis not present

## 2022-07-01 DIAGNOSIS — R269 Unspecified abnormalities of gait and mobility: Secondary | ICD-10-CM | POA: Diagnosis not present

## 2022-07-01 DIAGNOSIS — G35 Multiple sclerosis: Secondary | ICD-10-CM | POA: Diagnosis not present

## 2022-07-02 DIAGNOSIS — Z9181 History of falling: Secondary | ICD-10-CM | POA: Diagnosis not present

## 2022-07-02 DIAGNOSIS — I10 Essential (primary) hypertension: Secondary | ICD-10-CM | POA: Diagnosis not present

## 2022-07-02 DIAGNOSIS — F39 Unspecified mood [affective] disorder: Secondary | ICD-10-CM | POA: Diagnosis not present

## 2022-07-02 DIAGNOSIS — E785 Hyperlipidemia, unspecified: Secondary | ICD-10-CM | POA: Diagnosis not present

## 2022-07-02 DIAGNOSIS — I480 Paroxysmal atrial fibrillation: Secondary | ICD-10-CM | POA: Diagnosis not present

## 2022-07-02 DIAGNOSIS — M5030 Other cervical disc degeneration, unspecified cervical region: Secondary | ICD-10-CM | POA: Diagnosis not present

## 2022-07-02 DIAGNOSIS — I2510 Atherosclerotic heart disease of native coronary artery without angina pectoris: Secondary | ICD-10-CM | POA: Diagnosis not present

## 2022-07-02 DIAGNOSIS — Z8781 Personal history of (healed) traumatic fracture: Secondary | ICD-10-CM | POA: Diagnosis not present

## 2022-07-02 DIAGNOSIS — Z79899 Other long term (current) drug therapy: Secondary | ICD-10-CM | POA: Diagnosis not present

## 2022-07-02 DIAGNOSIS — Z7982 Long term (current) use of aspirin: Secondary | ICD-10-CM | POA: Diagnosis not present

## 2022-07-02 DIAGNOSIS — Z87891 Personal history of nicotine dependence: Secondary | ICD-10-CM | POA: Diagnosis not present

## 2022-07-02 DIAGNOSIS — D649 Anemia, unspecified: Secondary | ICD-10-CM | POA: Diagnosis not present

## 2022-07-02 DIAGNOSIS — Z7901 Long term (current) use of anticoagulants: Secondary | ICD-10-CM | POA: Diagnosis not present

## 2022-07-02 DIAGNOSIS — N390 Urinary tract infection, site not specified: Secondary | ICD-10-CM | POA: Diagnosis not present

## 2022-07-02 DIAGNOSIS — I69351 Hemiplegia and hemiparesis following cerebral infarction affecting right dominant side: Secondary | ICD-10-CM | POA: Diagnosis not present

## 2022-07-02 DIAGNOSIS — G35 Multiple sclerosis: Secondary | ICD-10-CM | POA: Diagnosis not present

## 2022-07-02 DIAGNOSIS — R413 Other amnesia: Secondary | ICD-10-CM | POA: Diagnosis not present

## 2022-07-02 DIAGNOSIS — M8580 Other specified disorders of bone density and structure, unspecified site: Secondary | ICD-10-CM | POA: Diagnosis not present

## 2022-07-05 DIAGNOSIS — D649 Anemia, unspecified: Secondary | ICD-10-CM | POA: Diagnosis not present

## 2022-07-05 DIAGNOSIS — N390 Urinary tract infection, site not specified: Secondary | ICD-10-CM | POA: Diagnosis not present

## 2022-07-05 DIAGNOSIS — G35 Multiple sclerosis: Secondary | ICD-10-CM | POA: Diagnosis not present

## 2022-07-05 DIAGNOSIS — Z79899 Other long term (current) drug therapy: Secondary | ICD-10-CM | POA: Diagnosis not present

## 2022-07-05 DIAGNOSIS — R413 Other amnesia: Secondary | ICD-10-CM | POA: Diagnosis not present

## 2022-07-05 DIAGNOSIS — Z87891 Personal history of nicotine dependence: Secondary | ICD-10-CM | POA: Diagnosis not present

## 2022-07-05 DIAGNOSIS — E785 Hyperlipidemia, unspecified: Secondary | ICD-10-CM | POA: Diagnosis not present

## 2022-07-05 DIAGNOSIS — I10 Essential (primary) hypertension: Secondary | ICD-10-CM | POA: Diagnosis not present

## 2022-07-05 DIAGNOSIS — Z7901 Long term (current) use of anticoagulants: Secondary | ICD-10-CM | POA: Diagnosis not present

## 2022-07-05 DIAGNOSIS — M5030 Other cervical disc degeneration, unspecified cervical region: Secondary | ICD-10-CM | POA: Diagnosis not present

## 2022-07-05 DIAGNOSIS — I480 Paroxysmal atrial fibrillation: Secondary | ICD-10-CM | POA: Diagnosis not present

## 2022-07-05 DIAGNOSIS — I2510 Atherosclerotic heart disease of native coronary artery without angina pectoris: Secondary | ICD-10-CM | POA: Diagnosis not present

## 2022-07-05 DIAGNOSIS — Z8781 Personal history of (healed) traumatic fracture: Secondary | ICD-10-CM | POA: Diagnosis not present

## 2022-07-05 DIAGNOSIS — I69351 Hemiplegia and hemiparesis following cerebral infarction affecting right dominant side: Secondary | ICD-10-CM | POA: Diagnosis not present

## 2022-07-05 DIAGNOSIS — Z7982 Long term (current) use of aspirin: Secondary | ICD-10-CM | POA: Diagnosis not present

## 2022-07-05 DIAGNOSIS — F39 Unspecified mood [affective] disorder: Secondary | ICD-10-CM | POA: Diagnosis not present

## 2022-07-05 DIAGNOSIS — M8580 Other specified disorders of bone density and structure, unspecified site: Secondary | ICD-10-CM | POA: Diagnosis not present

## 2022-07-05 DIAGNOSIS — Z9181 History of falling: Secondary | ICD-10-CM | POA: Diagnosis not present

## 2022-07-07 DIAGNOSIS — R413 Other amnesia: Secondary | ICD-10-CM | POA: Diagnosis not present

## 2022-07-07 DIAGNOSIS — Z87891 Personal history of nicotine dependence: Secondary | ICD-10-CM | POA: Diagnosis not present

## 2022-07-07 DIAGNOSIS — D649 Anemia, unspecified: Secondary | ICD-10-CM | POA: Diagnosis not present

## 2022-07-07 DIAGNOSIS — Z79899 Other long term (current) drug therapy: Secondary | ICD-10-CM | POA: Diagnosis not present

## 2022-07-07 DIAGNOSIS — Z9181 History of falling: Secondary | ICD-10-CM | POA: Diagnosis not present

## 2022-07-07 DIAGNOSIS — M8580 Other specified disorders of bone density and structure, unspecified site: Secondary | ICD-10-CM | POA: Diagnosis not present

## 2022-07-07 DIAGNOSIS — I69351 Hemiplegia and hemiparesis following cerebral infarction affecting right dominant side: Secondary | ICD-10-CM | POA: Diagnosis not present

## 2022-07-07 DIAGNOSIS — Z8781 Personal history of (healed) traumatic fracture: Secondary | ICD-10-CM | POA: Diagnosis not present

## 2022-07-07 DIAGNOSIS — I480 Paroxysmal atrial fibrillation: Secondary | ICD-10-CM | POA: Diagnosis not present

## 2022-07-07 DIAGNOSIS — M5030 Other cervical disc degeneration, unspecified cervical region: Secondary | ICD-10-CM | POA: Diagnosis not present

## 2022-07-07 DIAGNOSIS — I2510 Atherosclerotic heart disease of native coronary artery without angina pectoris: Secondary | ICD-10-CM | POA: Diagnosis not present

## 2022-07-07 DIAGNOSIS — E785 Hyperlipidemia, unspecified: Secondary | ICD-10-CM | POA: Diagnosis not present

## 2022-07-07 DIAGNOSIS — Z7982 Long term (current) use of aspirin: Secondary | ICD-10-CM | POA: Diagnosis not present

## 2022-07-07 DIAGNOSIS — F39 Unspecified mood [affective] disorder: Secondary | ICD-10-CM | POA: Diagnosis not present

## 2022-07-07 DIAGNOSIS — Z7901 Long term (current) use of anticoagulants: Secondary | ICD-10-CM | POA: Diagnosis not present

## 2022-07-07 DIAGNOSIS — N390 Urinary tract infection, site not specified: Secondary | ICD-10-CM | POA: Diagnosis not present

## 2022-07-07 DIAGNOSIS — I10 Essential (primary) hypertension: Secondary | ICD-10-CM | POA: Diagnosis not present

## 2022-07-07 DIAGNOSIS — G35 Multiple sclerosis: Secondary | ICD-10-CM | POA: Diagnosis not present

## 2022-07-09 DIAGNOSIS — N390 Urinary tract infection, site not specified: Secondary | ICD-10-CM | POA: Diagnosis not present

## 2022-07-09 DIAGNOSIS — Z9181 History of falling: Secondary | ICD-10-CM | POA: Diagnosis not present

## 2022-07-09 DIAGNOSIS — M5030 Other cervical disc degeneration, unspecified cervical region: Secondary | ICD-10-CM | POA: Diagnosis not present

## 2022-07-09 DIAGNOSIS — I10 Essential (primary) hypertension: Secondary | ICD-10-CM | POA: Diagnosis not present

## 2022-07-09 DIAGNOSIS — I480 Paroxysmal atrial fibrillation: Secondary | ICD-10-CM | POA: Diagnosis not present

## 2022-07-09 DIAGNOSIS — D649 Anemia, unspecified: Secondary | ICD-10-CM | POA: Diagnosis not present

## 2022-07-09 DIAGNOSIS — Z87891 Personal history of nicotine dependence: Secondary | ICD-10-CM | POA: Diagnosis not present

## 2022-07-09 DIAGNOSIS — Z79899 Other long term (current) drug therapy: Secondary | ICD-10-CM | POA: Diagnosis not present

## 2022-07-09 DIAGNOSIS — Z7982 Long term (current) use of aspirin: Secondary | ICD-10-CM | POA: Diagnosis not present

## 2022-07-09 DIAGNOSIS — R413 Other amnesia: Secondary | ICD-10-CM | POA: Diagnosis not present

## 2022-07-09 DIAGNOSIS — Z7901 Long term (current) use of anticoagulants: Secondary | ICD-10-CM | POA: Diagnosis not present

## 2022-07-09 DIAGNOSIS — G35 Multiple sclerosis: Secondary | ICD-10-CM | POA: Diagnosis not present

## 2022-07-09 DIAGNOSIS — F39 Unspecified mood [affective] disorder: Secondary | ICD-10-CM | POA: Diagnosis not present

## 2022-07-09 DIAGNOSIS — I2510 Atherosclerotic heart disease of native coronary artery without angina pectoris: Secondary | ICD-10-CM | POA: Diagnosis not present

## 2022-07-09 DIAGNOSIS — M8580 Other specified disorders of bone density and structure, unspecified site: Secondary | ICD-10-CM | POA: Diagnosis not present

## 2022-07-09 DIAGNOSIS — E785 Hyperlipidemia, unspecified: Secondary | ICD-10-CM | POA: Diagnosis not present

## 2022-07-09 DIAGNOSIS — Z8781 Personal history of (healed) traumatic fracture: Secondary | ICD-10-CM | POA: Diagnosis not present

## 2022-07-09 DIAGNOSIS — I69351 Hemiplegia and hemiparesis following cerebral infarction affecting right dominant side: Secondary | ICD-10-CM | POA: Diagnosis not present

## 2022-07-12 DIAGNOSIS — I2510 Atherosclerotic heart disease of native coronary artery without angina pectoris: Secondary | ICD-10-CM | POA: Diagnosis not present

## 2022-07-12 DIAGNOSIS — D649 Anemia, unspecified: Secondary | ICD-10-CM | POA: Diagnosis not present

## 2022-07-12 DIAGNOSIS — N390 Urinary tract infection, site not specified: Secondary | ICD-10-CM | POA: Diagnosis not present

## 2022-07-12 DIAGNOSIS — M8580 Other specified disorders of bone density and structure, unspecified site: Secondary | ICD-10-CM | POA: Diagnosis not present

## 2022-07-12 DIAGNOSIS — Z7901 Long term (current) use of anticoagulants: Secondary | ICD-10-CM | POA: Diagnosis not present

## 2022-07-12 DIAGNOSIS — R413 Other amnesia: Secondary | ICD-10-CM | POA: Diagnosis not present

## 2022-07-12 DIAGNOSIS — M5030 Other cervical disc degeneration, unspecified cervical region: Secondary | ICD-10-CM | POA: Diagnosis not present

## 2022-07-12 DIAGNOSIS — Z87891 Personal history of nicotine dependence: Secondary | ICD-10-CM | POA: Diagnosis not present

## 2022-07-12 DIAGNOSIS — G35 Multiple sclerosis: Secondary | ICD-10-CM | POA: Diagnosis not present

## 2022-07-12 DIAGNOSIS — Z79899 Other long term (current) drug therapy: Secondary | ICD-10-CM | POA: Diagnosis not present

## 2022-07-12 DIAGNOSIS — Z9181 History of falling: Secondary | ICD-10-CM | POA: Diagnosis not present

## 2022-07-12 DIAGNOSIS — I480 Paroxysmal atrial fibrillation: Secondary | ICD-10-CM | POA: Diagnosis not present

## 2022-07-12 DIAGNOSIS — Z7982 Long term (current) use of aspirin: Secondary | ICD-10-CM | POA: Diagnosis not present

## 2022-07-12 DIAGNOSIS — E785 Hyperlipidemia, unspecified: Secondary | ICD-10-CM | POA: Diagnosis not present

## 2022-07-12 DIAGNOSIS — I10 Essential (primary) hypertension: Secondary | ICD-10-CM | POA: Diagnosis not present

## 2022-07-12 DIAGNOSIS — F39 Unspecified mood [affective] disorder: Secondary | ICD-10-CM | POA: Diagnosis not present

## 2022-07-12 DIAGNOSIS — Z8781 Personal history of (healed) traumatic fracture: Secondary | ICD-10-CM | POA: Diagnosis not present

## 2022-07-12 DIAGNOSIS — I69351 Hemiplegia and hemiparesis following cerebral infarction affecting right dominant side: Secondary | ICD-10-CM | POA: Diagnosis not present

## 2022-07-13 DIAGNOSIS — I10 Essential (primary) hypertension: Secondary | ICD-10-CM | POA: Diagnosis not present

## 2022-07-13 DIAGNOSIS — D649 Anemia, unspecified: Secondary | ICD-10-CM | POA: Diagnosis not present

## 2022-07-13 DIAGNOSIS — Z8781 Personal history of (healed) traumatic fracture: Secondary | ICD-10-CM | POA: Diagnosis not present

## 2022-07-13 DIAGNOSIS — E785 Hyperlipidemia, unspecified: Secondary | ICD-10-CM | POA: Diagnosis not present

## 2022-07-13 DIAGNOSIS — I480 Paroxysmal atrial fibrillation: Secondary | ICD-10-CM | POA: Diagnosis not present

## 2022-07-13 DIAGNOSIS — Z87891 Personal history of nicotine dependence: Secondary | ICD-10-CM | POA: Diagnosis not present

## 2022-07-13 DIAGNOSIS — I69351 Hemiplegia and hemiparesis following cerebral infarction affecting right dominant side: Secondary | ICD-10-CM | POA: Diagnosis not present

## 2022-07-13 DIAGNOSIS — R413 Other amnesia: Secondary | ICD-10-CM | POA: Diagnosis not present

## 2022-07-13 DIAGNOSIS — M8580 Other specified disorders of bone density and structure, unspecified site: Secondary | ICD-10-CM | POA: Diagnosis not present

## 2022-07-13 DIAGNOSIS — M5030 Other cervical disc degeneration, unspecified cervical region: Secondary | ICD-10-CM | POA: Diagnosis not present

## 2022-07-13 DIAGNOSIS — Z7901 Long term (current) use of anticoagulants: Secondary | ICD-10-CM | POA: Diagnosis not present

## 2022-07-13 DIAGNOSIS — Z79899 Other long term (current) drug therapy: Secondary | ICD-10-CM | POA: Diagnosis not present

## 2022-07-13 DIAGNOSIS — I2510 Atherosclerotic heart disease of native coronary artery without angina pectoris: Secondary | ICD-10-CM | POA: Diagnosis not present

## 2022-07-13 DIAGNOSIS — F39 Unspecified mood [affective] disorder: Secondary | ICD-10-CM | POA: Diagnosis not present

## 2022-07-13 DIAGNOSIS — Z9181 History of falling: Secondary | ICD-10-CM | POA: Diagnosis not present

## 2022-07-13 DIAGNOSIS — N390 Urinary tract infection, site not specified: Secondary | ICD-10-CM | POA: Diagnosis not present

## 2022-07-13 DIAGNOSIS — Z7982 Long term (current) use of aspirin: Secondary | ICD-10-CM | POA: Diagnosis not present

## 2022-07-13 DIAGNOSIS — G35 Multiple sclerosis: Secondary | ICD-10-CM | POA: Diagnosis not present

## 2022-07-14 DIAGNOSIS — G35 Multiple sclerosis: Secondary | ICD-10-CM | POA: Diagnosis not present

## 2022-07-14 DIAGNOSIS — F39 Unspecified mood [affective] disorder: Secondary | ICD-10-CM | POA: Diagnosis not present

## 2022-07-14 DIAGNOSIS — Z9181 History of falling: Secondary | ICD-10-CM | POA: Diagnosis not present

## 2022-07-14 DIAGNOSIS — I2510 Atherosclerotic heart disease of native coronary artery without angina pectoris: Secondary | ICD-10-CM | POA: Diagnosis not present

## 2022-07-14 DIAGNOSIS — M8580 Other specified disorders of bone density and structure, unspecified site: Secondary | ICD-10-CM | POA: Diagnosis not present

## 2022-07-14 DIAGNOSIS — I69351 Hemiplegia and hemiparesis following cerebral infarction affecting right dominant side: Secondary | ICD-10-CM | POA: Diagnosis not present

## 2022-07-14 DIAGNOSIS — Z87891 Personal history of nicotine dependence: Secondary | ICD-10-CM | POA: Diagnosis not present

## 2022-07-14 DIAGNOSIS — Z79899 Other long term (current) drug therapy: Secondary | ICD-10-CM | POA: Diagnosis not present

## 2022-07-14 DIAGNOSIS — D649 Anemia, unspecified: Secondary | ICD-10-CM | POA: Diagnosis not present

## 2022-07-14 DIAGNOSIS — R413 Other amnesia: Secondary | ICD-10-CM | POA: Diagnosis not present

## 2022-07-14 DIAGNOSIS — Z7982 Long term (current) use of aspirin: Secondary | ICD-10-CM | POA: Diagnosis not present

## 2022-07-14 DIAGNOSIS — I10 Essential (primary) hypertension: Secondary | ICD-10-CM | POA: Diagnosis not present

## 2022-07-14 DIAGNOSIS — Z8781 Personal history of (healed) traumatic fracture: Secondary | ICD-10-CM | POA: Diagnosis not present

## 2022-07-14 DIAGNOSIS — N390 Urinary tract infection, site not specified: Secondary | ICD-10-CM | POA: Diagnosis not present

## 2022-07-14 DIAGNOSIS — M5030 Other cervical disc degeneration, unspecified cervical region: Secondary | ICD-10-CM | POA: Diagnosis not present

## 2022-07-14 DIAGNOSIS — I480 Paroxysmal atrial fibrillation: Secondary | ICD-10-CM | POA: Diagnosis not present

## 2022-07-14 DIAGNOSIS — E785 Hyperlipidemia, unspecified: Secondary | ICD-10-CM | POA: Diagnosis not present

## 2022-07-14 DIAGNOSIS — Z7901 Long term (current) use of anticoagulants: Secondary | ICD-10-CM | POA: Diagnosis not present

## 2022-07-31 DIAGNOSIS — M6281 Muscle weakness (generalized): Secondary | ICD-10-CM | POA: Diagnosis not present

## 2022-07-31 DIAGNOSIS — R269 Unspecified abnormalities of gait and mobility: Secondary | ICD-10-CM | POA: Diagnosis not present

## 2022-07-31 DIAGNOSIS — G35 Multiple sclerosis: Secondary | ICD-10-CM | POA: Diagnosis not present

## 2022-08-10 ENCOUNTER — Telehealth: Payer: Self-pay | Admitting: Pharmacist

## 2022-08-10 MED ORDER — REPATHA SURECLICK 140 MG/ML ~~LOC~~ SOAJ: 1.0000 | SUBCUTANEOUS | 11 refills | Status: AC

## 2022-08-10 NOTE — Telephone Encounter (Signed)
Repatha preferred [id number] on pt's formulary this year, Praluent no longer covered. PA submitted, key [id number], approved through 08/02/23. Rx sent to pharmacy, pt's husband made aware.

## 2022-08-31 DIAGNOSIS — G35 Multiple sclerosis: Secondary | ICD-10-CM | POA: Diagnosis not present

## 2022-08-31 DIAGNOSIS — R269 Unspecified abnormalities of gait and mobility: Secondary | ICD-10-CM | POA: Diagnosis not present

## 2022-08-31 DIAGNOSIS — M6281 Muscle weakness (generalized): Secondary | ICD-10-CM | POA: Diagnosis not present

## 2022-09-06 ENCOUNTER — Other Ambulatory Visit: Payer: Self-pay | Admitting: *Deleted

## 2022-09-06 MED ORDER — METOPROLOL TARTRATE 50 MG PO TABS: 50.0000 mg | ORAL_TABLET | Freq: Two times a day (BID) | ORAL | 1 refills | Status: DC

## 2022-09-30 DIAGNOSIS — M6281 Muscle weakness (generalized): Secondary | ICD-10-CM | POA: Diagnosis not present

## 2022-09-30 DIAGNOSIS — R269 Unspecified abnormalities of gait and mobility: Secondary | ICD-10-CM | POA: Diagnosis not present

## 2022-09-30 DIAGNOSIS — G35 Multiple sclerosis: Secondary | ICD-10-CM | POA: Diagnosis not present

## 2022-10-30 DIAGNOSIS — G35 Multiple sclerosis: Secondary | ICD-10-CM | POA: Diagnosis not present

## 2022-10-30 DIAGNOSIS — M6281 Muscle weakness (generalized): Secondary | ICD-10-CM | POA: Diagnosis not present

## 2022-10-30 DIAGNOSIS — R269 Unspecified abnormalities of gait and mobility: Secondary | ICD-10-CM | POA: Diagnosis not present

## 2022-11-01 ENCOUNTER — Emergency Department (HOSPITAL_BASED_OUTPATIENT_CLINIC_OR_DEPARTMENT_OTHER): Payer: Medicare HMO

## 2022-11-01 ENCOUNTER — Encounter (HOSPITAL_BASED_OUTPATIENT_CLINIC_OR_DEPARTMENT_OTHER): Payer: Self-pay | Admitting: *Deleted

## 2022-11-01 ENCOUNTER — Inpatient Hospital Stay (HOSPITAL_BASED_OUTPATIENT_CLINIC_OR_DEPARTMENT_OTHER)
Admission: EM | Admit: 2022-11-01 | Discharge: 2022-11-04 | DRG: 689 | Disposition: A | Discharge status: Home / Self Care | Payer: Medicare HMO | Attending: Internal Medicine | Admitting: Internal Medicine

## 2022-11-01 ENCOUNTER — Other Ambulatory Visit: Payer: Self-pay

## 2022-11-01 DIAGNOSIS — Z8249 Family history of ischemic heart disease and other diseases of the circulatory system: Secondary | ICD-10-CM | POA: Diagnosis not present

## 2022-11-01 DIAGNOSIS — F32A Depression, unspecified: Secondary | ICD-10-CM | POA: Diagnosis present

## 2022-11-01 DIAGNOSIS — J329 Chronic sinusitis, unspecified: Secondary | ICD-10-CM | POA: Diagnosis present

## 2022-11-01 DIAGNOSIS — Z79899 Other long term (current) drug therapy: Secondary | ICD-10-CM

## 2022-11-01 DIAGNOSIS — F0394 Unspecified dementia, unspecified severity, with anxiety: Secondary | ICD-10-CM | POA: Diagnosis present

## 2022-11-01 DIAGNOSIS — E860 Dehydration: Secondary | ICD-10-CM | POA: Diagnosis present

## 2022-11-01 DIAGNOSIS — Z7401 Bed confinement status: Secondary | ICD-10-CM

## 2022-11-01 DIAGNOSIS — L89310 Pressure ulcer of right buttock, unstageable: Secondary | ICD-10-CM | POA: Diagnosis not present

## 2022-11-01 DIAGNOSIS — I480 Paroxysmal atrial fibrillation: Secondary | ICD-10-CM | POA: Diagnosis present

## 2022-11-01 DIAGNOSIS — I69391 Dysphagia following cerebral infarction: Secondary | ICD-10-CM

## 2022-11-01 DIAGNOSIS — Z955 Presence of coronary angioplasty implant and graft: Secondary | ICD-10-CM | POA: Diagnosis not present

## 2022-11-01 DIAGNOSIS — E7800 Pure hypercholesterolemia, unspecified: Secondary | ICD-10-CM | POA: Diagnosis not present

## 2022-11-01 DIAGNOSIS — N390 Urinary tract infection, site not specified: Principal | ICD-10-CM | POA: Diagnosis present

## 2022-11-01 DIAGNOSIS — R4182 Altered mental status, unspecified: Secondary | ICD-10-CM | POA: Diagnosis not present

## 2022-11-01 DIAGNOSIS — Z743 Need for continuous supervision: Secondary | ICD-10-CM | POA: Diagnosis not present

## 2022-11-01 DIAGNOSIS — D631 Anemia in chronic kidney disease: Secondary | ICD-10-CM | POA: Diagnosis not present

## 2022-11-01 DIAGNOSIS — L8990 Pressure ulcer of unspecified site, unspecified stage: Secondary | ICD-10-CM | POA: Diagnosis present

## 2022-11-01 DIAGNOSIS — I2510 Atherosclerotic heart disease of native coronary artery without angina pectoris: Secondary | ICD-10-CM | POA: Diagnosis present

## 2022-11-01 DIAGNOSIS — Z7982 Long term (current) use of aspirin: Secondary | ICD-10-CM

## 2022-11-01 DIAGNOSIS — Z87891 Personal history of nicotine dependence: Secondary | ICD-10-CM

## 2022-11-01 DIAGNOSIS — G35D Multiple sclerosis, unspecified: Secondary | ICD-10-CM | POA: Diagnosis present

## 2022-11-01 DIAGNOSIS — M5030 Other cervical disc degeneration, unspecified cervical region: Secondary | ICD-10-CM | POA: Diagnosis not present

## 2022-11-01 DIAGNOSIS — G9341 Metabolic encephalopathy: Secondary | ICD-10-CM | POA: Diagnosis present

## 2022-11-01 DIAGNOSIS — Z888 Allergy status to other drugs, medicaments and biological substances status: Secondary | ICD-10-CM

## 2022-11-01 DIAGNOSIS — Z820 Family history of epilepsy and other diseases of the nervous system: Secondary | ICD-10-CM

## 2022-11-01 DIAGNOSIS — I1310 Hypertensive heart and chronic kidney disease without heart failure, with stage 1 through stage 4 chronic kidney disease, or unspecified chronic kidney disease: Secondary | ICD-10-CM | POA: Diagnosis not present

## 2022-11-01 DIAGNOSIS — I959 Hypotension, unspecified: Secondary | ICD-10-CM | POA: Diagnosis not present

## 2022-11-01 DIAGNOSIS — K219 Gastro-esophageal reflux disease without esophagitis: Secondary | ICD-10-CM | POA: Diagnosis present

## 2022-11-01 DIAGNOSIS — Z880 Allergy status to penicillin: Secondary | ICD-10-CM

## 2022-11-01 DIAGNOSIS — E785 Hyperlipidemia, unspecified: Secondary | ICD-10-CM | POA: Diagnosis present

## 2022-11-01 DIAGNOSIS — R531 Weakness: Secondary | ICD-10-CM | POA: Diagnosis not present

## 2022-11-01 DIAGNOSIS — G35 Multiple sclerosis: Secondary | ICD-10-CM | POA: Diagnosis present

## 2022-11-01 DIAGNOSIS — F0393 Unspecified dementia, unspecified severity, with mood disturbance: Secondary | ICD-10-CM | POA: Diagnosis present

## 2022-11-01 DIAGNOSIS — M1990 Unspecified osteoarthritis, unspecified site: Secondary | ICD-10-CM | POA: Diagnosis present

## 2022-11-01 DIAGNOSIS — F419 Anxiety disorder, unspecified: Secondary | ICD-10-CM | POA: Diagnosis present

## 2022-11-01 DIAGNOSIS — Z7901 Long term (current) use of anticoagulants: Secondary | ICD-10-CM

## 2022-11-01 DIAGNOSIS — L89892 Pressure ulcer of other site, stage 2: Secondary | ICD-10-CM | POA: Diagnosis not present

## 2022-11-01 DIAGNOSIS — Z993 Dependence on wheelchair: Secondary | ICD-10-CM

## 2022-11-01 DIAGNOSIS — N1831 Chronic kidney disease, stage 3a: Secondary | ICD-10-CM | POA: Diagnosis not present

## 2022-11-01 DIAGNOSIS — Z8041 Family history of malignant neoplasm of ovary: Secondary | ICD-10-CM

## 2022-11-01 DIAGNOSIS — B9620 Unspecified Escherichia coli [E. coli] as the cause of diseases classified elsewhere: Secondary | ICD-10-CM | POA: Diagnosis not present

## 2022-11-01 DIAGNOSIS — R69 Illness, unspecified: Secondary | ICD-10-CM | POA: Diagnosis not present

## 2022-11-01 LAB — COMPREHENSIVE METABOLIC PANEL
ALT: 11 U/L (ref 0–44)
AST: 16 U/L (ref 15–41)
Albumin: 3.7 g/dL (ref 3.5–5.0)
Alkaline Phosphatase: 69 U/L (ref 38–126)
Anion gap: 9 (ref 5–15)
BUN: 35 mg/dL — ABNORMAL HIGH (ref 8–23)
CO2: 23 mmol/L (ref 22–32)
Calcium: 9.5 mg/dL (ref 8.9–10.3)
Chloride: 104 mmol/L (ref 98–111)
Creatinine, Ser: 1.04 mg/dL — ABNORMAL HIGH (ref 0.44–1.00)
GFR, Estimated: 55 mL/min — ABNORMAL LOW (ref >60)
Glucose, Bld: 127 mg/dL — ABNORMAL HIGH (ref 70–99)
Potassium: 4.4 mmol/L (ref 3.5–5.1)
Sodium: 136 mmol/L (ref 135–145)
Total Bilirubin: 0.4 mg/dL (ref 0.3–1.2)
Total Protein: 6.9 g/dL (ref 6.5–8.1)

## 2022-11-01 LAB — URINALYSIS, ROUTINE W REFLEX MICROSCOPIC
Bilirubin Urine: NEGATIVE
Glucose, UA: NEGATIVE mg/dL
Ketones, ur: NEGATIVE mg/dL
Nitrite: POSITIVE — AB
Protein, ur: 30 mg/dL — AB
Specific Gravity, Urine: 1.022 (ref 1.005–1.030)
WBC, UA: >50 WBC/hpf (ref 0–5)
pH: 6 (ref 5.0–8.0)

## 2022-11-01 LAB — CBC WITH DIFFERENTIAL/PLATELET
Abs Immature Granulocytes: 0.05 10*3/uL (ref 0.00–0.07)
Basophils Absolute: 0 10*3/uL (ref 0.0–0.1)
Basophils Relative: 0 %
Eosinophils Absolute: 0.1 10*3/uL (ref 0.0–0.5)
Eosinophils Relative: 1 %
HCT: 29.4 % — ABNORMAL LOW (ref 36.0–46.0)
Hemoglobin: 9.1 g/dL — ABNORMAL LOW (ref 12.0–15.0)
Immature Granulocytes: 1 %
Lymphocytes Relative: 11 %
Lymphs Abs: 1.1 10*3/uL (ref 0.7–4.0)
MCH: 30.3 pg (ref 26.0–34.0)
MCHC: 31 g/dL (ref 30.0–36.0)
MCV: 98 fL (ref 80.0–100.0)
Monocytes Absolute: 1 10*3/uL (ref 0.1–1.0)
Monocytes Relative: 9 %
Neutro Abs: 8.3 10*3/uL — ABNORMAL HIGH (ref 1.7–7.7)
Neutrophils Relative %: 78 %
Platelets: 457 10*3/uL — ABNORMAL HIGH (ref 150–400)
RBC: 3 MIL/uL — ABNORMAL LOW (ref 3.87–5.11)
RDW: 15.3 % (ref 11.5–15.5)
WBC: 10.6 10*3/uL — ABNORMAL HIGH (ref 4.0–10.5)
nRBC: 0 % (ref 0.0–0.2)

## 2022-11-01 LAB — LACTIC ACID, PLASMA
Lactic Acid, Venous: 1.5 mmol/L (ref 0.5–1.9)
Lactic Acid, Venous: 1.3 mmol/L (ref 0.5–1.9)

## 2022-11-01 LAB — PROTIME-INR
INR: 1.3 — ABNORMAL HIGH (ref 0.8–1.2)
Prothrombin Time: 15.8 seconds — ABNORMAL HIGH (ref 11.4–15.2)

## 2022-11-01 MED ORDER — SODIUM CHLORIDE 0.9 % IV SOLN
1.0000 g | Freq: Once | INTRAVENOUS | Status: AC
  Administered 2022-11-01: 1 g via INTRAVENOUS
  Filled 2022-11-01: qty 10

## 2022-11-01 NOTE — ED Provider Notes (Signed)
[facility] Provider Note   CSN: [id number] Arrival date & time: 11/01/22  1520     History  Chief Complaint  Patient presents with   Wound Check    Kaitlin [name] is a 77 y.o. female.   Wound Check  Patient presents with husband.  Generalized weakness and wounds on her rear end.  History of MS and previous stroke.  Normally able to walk with a walker or assistance now has been bedbound for the last few weeks to months.  Cannot straighten her legs out.  Reportedly has worsening wounds on her urine.  States worsened after she got a urinary tract infection.    Past Medical History:  Diagnosis Date   Abnormality of gait as late effect of stroke 02/23/2017   Acute hypoxemic respiratory failure    Anemia    Arthritis    Atrial fibrillation with RVR 02/17/2017   CAD S/P percutaneous coronary angioplasty 2001   Colon polyps    CVA (cerebral vascular accident) 01/2017   DDD (degenerative disc disease), cervical    Dysphagia as late effect of stroke 02/23/2017   H/O blood clots    HCVD (hypertensive cardiovascular disease) 01/2017   normal LVF with moderate LVH, grade 1 DD   High cholesterol    HTN (hypertension)    Hypokalemia    Labile blood pressure    Leukocytosis    Multiple sclerosis    Osteopenia    PAF (paroxysmal atrial fibrillation) 01/2017   Right hemiparesis     Home Medications Prior to Admission medications   Medication Sig Start Date End Date Taking? Authorizing Provider  Ascorbic Acid (VITAMIN C) 500 MG CAPS Take 500 mg by mouth daily.    [provider]  aspirin EC 81 MG tablet Take 1 tablet (81 mg total) by mouth daily. Swallow whole. 07/08/21   Freada Bergeron, MD  Calcium Citrate-Vitamin D (CALCIUM CITRATE + D PO) Take 1 tablet by mouth daily.    [provider]  cholecalciferol (VITAMIN D) 1000 units tablet Take 3,000 Units by mouth daily.    [provider]  Evolocumab (REPATHA  SURECLICK) [id number] MG/ML SOAJ Inject 140 mg into the skin every 14 (fourteen) days. 08/10/22   Freada Bergeron, MD  ferrous sulfate 325 (65 FE) MG tablet Take 325 mg by mouth 2 (two) times daily with a meal.     [provider]  FLUoxetine (PROZAC) 20 MG capsule Take 20 mg by mouth daily. 02/08/22   [provider]  hydrochlorothiazide (MICROZIDE) 12.5 MG capsule TAKE 1 CAPSULE BY MOUTH EVERY DAY 01/15/20   Dorothy Spark, MD  lisinopril (ZESTRIL) 2.5 MG tablet Take 1 tablet (2.5 mg total) by mouth daily. 06/15/22   Freada Bergeron, MD  metoprolol tartrate (LOPRESSOR) 50 MG tablet Take 1 tablet (50 mg total) by mouth 2 (two) times daily. 09/06/22   Freada Bergeron, MD  Multiple Vitamins-Minerals (PRESERVISION AREDS PO) Take 1 tablet by mouth 2 (two) times daily.    [provider]  pantoprazole (PROTONIX) 40 MG tablet Take 1 tablet (40 mg total) by mouth daily. 03/19/17   Love, Ivan Anchors, PA-C  XARELTO 15 MG TABS tablet TAKE 1 TABLET (15 MG TOTAL) BY MOUTH DAILY WITH SUPPER. 08/14/19   Dorothy Spark, MD      Allergies    Eliquis [apixaban] and Penicillins    Review of Systems   Review of Systems  Physical Exam  Updated Vital Signs BP (!) 146/54   Pulse 65   Temp 97.8 F (36.6 C) (Rectal)   Resp 15   SpO2 96%  Physical Exam Vitals and nursing note reviewed.  Abdominal:     Tenderness: There is no abdominal tenderness.  Skin:    Capillary Refill: Capillary refill takes less than 2 seconds.  Neurological:     Mental Status: She is alert.     Comments: Awake and answers some questions but particularly defers to husband.  Able to raise both arms send decent grip strength however both lower extremities pretty much contracted.     ED Results / Procedures / Treatments   Labs (all labs ordered are listed, but only abnormal results are displayed) Labs Reviewed  COMPREHENSIVE METABOLIC PANEL - Abnormal; Notable for the following components:       Result Value   Glucose, Bld 127 (*)    BUN 35 (*)    Creatinine, Ser 1.04 (*)    GFR, Estimated 55 (*)    All other components within normal limits  CBC WITH DIFFERENTIAL/PLATELET - Abnormal; Notable for the following components:   WBC 10.6 (*)    RBC 3.00 (*)    Hemoglobin 9.1 (*)    HCT 29.4 (*)    Platelets 457 (*)    Neutro Abs 8.3 (*)    All other components within normal limits  PROTIME-INR - Abnormal; Notable for the following components:   Prothrombin Time 15.8 (*)    INR 1.3 (*)    All other components within normal limits  URINALYSIS, ROUTINE W REFLEX MICROSCOPIC - Abnormal; Notable for the following components:   APPearance CLOUDY (*)    Hgb urine dipstick SMALL (*)    Protein, ur 30 (*)    Nitrite POSITIVE (*)    Leukocytes,Ua LARGE (*)    Bacteria, UA MANY (*)    All other components within normal limits  CULTURE, BLOOD (ROUTINE X 2)  CULTURE, BLOOD (ROUTINE X 2)  URINE CULTURE  LACTIC ACID, PLASMA  LACTIC ACID, PLASMA    EKG EKG Interpretation  Date/Time:  Monday November 01 2022 18:18:33 EDT Ventricular Rate:  66 PR Interval:  157 QRS Duration: 86 QT Interval:  424 QTC Calculation: 445 R Axis:   26 Text Interpretation: Sinus rhythm Probable left atrial enlargement RSR' in V1 or V2, right VCD or RVH Abnrm T, consider ischemia, anterolateral lds Confirmed by Davonna Belling [phone]) on 11/01/2022 7:13:35 PM  Radiology CT Head Wo Contrast  Result Date: 11/01/2022 CLINICAL DATA:  Altered mental status, neurologic deficit EXAM: CT HEAD WITHOUT CONTRAST TECHNIQUE: Contiguous axial images were obtained from the base of the skull through the vertex without intravenous contrast. RADIATION DOSE REDUCTION: This exam was performed according to the departmental dose-optimization program which includes automated exposure control, adjustment of the mA and/or kV according to patient size and/or use of iterative reconstruction technique. COMPARISON:  Previous studies  including the examination of 06/04/2017 FINDINGS: Brain: No acute intracranial findings are seen. There are no signs of bleeding within the cranium. There is interval clearing of small patchy parenchymal hemorrhage in the right frontal cortex seen on the previous study. Left lateral ventricle is larger than right. There is old infarct in left basal ganglia. Cortical sulci are prominent. There is decreased density in periventricular and subcortical white matter. Vascular: Unremarkable. Skull: No acute findings are seen. Sinuses/Orbits: There is mild mucosal thickening in ethmoid and right maxillary sinuses. Other: No significant changes are noted. IMPRESSION:  No acute intracranial findings are seen. Old infarct is seen in left basal ganglia. Atrophy. Small-vessel disease. Mild chronic sinusitis. Electronically Signed   By: Elmer Picker M.D.   On: 11/01/2022 18:36    Procedures Procedures    Medications Ordered in ED Medications - No data to display  ED Course/ Medical Decision Making/ A&P                             Medical Decision Making Amount and/or Complexity of Data Reviewed Labs: ordered. Radiology: ordered.  Risk Decision regarding hospitalization.   Patient with worsening physical status over the last few months.  History of MS and previous stroke.  Normally has ambulation ability but now cannot get out of bed.  Also it appears as of both legs are contracted.  Good movement of her extremities.  No fall.  Also reportedly has pressure wounds on her rear end.  Will evaluate those.  However this potentially could be MS causing some of the weakness.  Will get basic blood work urinalysis and head CT.  Likely require admission to hospital.  Lab work reassuring.  However urine does show UTI.  Head CT reassuring.  I think with UTI she would benefit from mission to the hospital.  Also will need workup for the worsening lower extremity weakness.  Potentially could be a cause such as her  MS.  No longer able to ambulate.  Will discuss with hospitalist. Hospitalist will admit.        Final Clinical Impression(s) / ED Diagnoses Final diagnoses:  Urinary tract infection without hematuria, site unspecified  Weakness    Rx / DC Orders ED Discharge Orders     None         Davonna Belling, MD 11/01/22 2342

## 2022-11-01 NOTE — Progress Notes (Signed)
Plan of Care Note for accepted transfer   Patient: Kaitlin [name] MRN: [id number]   DOA: 11/01/2022  Facility requesting transfer: [facility] Requesting Provider: Dr. Alvino Chapel Reason for transfer: Acute metabolic encephalopathy due to UTI and inability to ambulate with history of MS Facility course: Kaitlin CLESTER is a 77 y.o. female, with history of MS and CVA as well as dementia who presents with her husband with acute onset of generalized weakness and wounds on her buttock.  She is normally able to walk with a walker or assistance now has been bedbound for the last few weeks to months.  Cannot straighten her legs out.  Reportedly has worsening wounds on her urine.  States worsened after she got a urinary tract infection.  When she came to the ED temperature was 97.5, BP 128/51 with otherwise normal vital signs.  Labs revealed a positive UA for UTI and CBC with a BUN of 35 and creatinine 1.04, lactic acid 1.5 and later 1.3 and CBC with WBC of 10.6 with anemia worse than previous levels but however compared to 2020.  INR was 1.3 with a PTT of 15.8.  EKG showed normal sinus rhythm with a rate of 66 with probable left atrial enlargement and RSR-and T wave inversion anterolaterally.  Noncontrasted head CT scan showed old infarct seen in the left basal ganglia, atrophy, small vessel disease with no acute intracranial findings.  It showed mild chronic sinusitis.  The patient was given a gram of IV Rocephin and had 2 blood cultures drawn.   Plan of care: The patient is accepted for admission to Telemetry unit, at [facility]..   The patient will be under the care and responsibility of the ER physician until arrival to [facility].  Author: Christel Mormon, MD 11/01/2022  Check [email or web address] for on-call coverage.  Nursing staff, Please call [place] number on Amion as soon as patient's arrival, so appropriate admitting provider can evaluate the pt.

## 2022-11-01 NOTE — ED Triage Notes (Signed)
Pt is brought in by ems from home.  Pt is accompanied by her husband.  He states that she is unable to ambulate and is incontinent and has bed sores that have been getting worse.  Pt has care 8 hours every day.  No fever or chills

## 2022-11-02 DIAGNOSIS — I480 Paroxysmal atrial fibrillation: Secondary | ICD-10-CM

## 2022-11-02 DIAGNOSIS — F419 Anxiety disorder, unspecified: Secondary | ICD-10-CM | POA: Diagnosis present

## 2022-11-02 DIAGNOSIS — L8990 Pressure ulcer of unspecified site, unspecified stage: Secondary | ICD-10-CM | POA: Diagnosis present

## 2022-11-02 DIAGNOSIS — L89310 Pressure ulcer of right buttock, unstageable: Secondary | ICD-10-CM

## 2022-11-02 DIAGNOSIS — G9341 Metabolic encephalopathy: Secondary | ICD-10-CM | POA: Diagnosis not present

## 2022-11-02 DIAGNOSIS — N390 Urinary tract infection, site not specified: Secondary | ICD-10-CM

## 2022-11-02 LAB — CBC
HCT: 28.6 % — ABNORMAL LOW (ref 36.0–46.0)
Hemoglobin: 8.9 g/dL — ABNORMAL LOW (ref 12.0–15.0)
MCH: 30.1 pg (ref 26.0–34.0)
MCHC: 31.1 g/dL (ref 30.0–36.0)
MCV: 96.6 fL (ref 80.0–100.0)
Platelets: 413 10*3/uL — ABNORMAL HIGH (ref 150–400)
RBC: 2.96 MIL/uL — ABNORMAL LOW (ref 3.87–5.11)
RDW: 14.8 % (ref 11.5–15.5)
WBC: 8.2 10*3/uL (ref 4.0–10.5)
nRBC: 0 % (ref 0.0–0.2)

## 2022-11-02 LAB — BASIC METABOLIC PANEL
Anion gap: 10 (ref 5–15)
BUN: 24 mg/dL — ABNORMAL HIGH (ref 8–23)
CO2: 23 mmol/L (ref 22–32)
Calcium: 9 mg/dL (ref 8.9–10.3)
Chloride: 103 mmol/L (ref 98–111)
Creatinine, Ser: 0.82 mg/dL (ref 0.44–1.00)
GFR, Estimated: >60 mL/min (ref >60)
Glucose, Bld: 103 mg/dL — ABNORMAL HIGH (ref 70–99)
Potassium: 4.1 mmol/L (ref 3.5–5.1)
Sodium: 136 mmol/L (ref 135–145)

## 2022-11-02 LAB — MAGNESIUM: Magnesium: 1.9 mg/dL (ref 1.7–2.4)

## 2022-11-02 LAB — BRAIN NATRIURETIC PEPTIDE: B Natriuretic Peptide: 589.3 pg/mL — ABNORMAL HIGH (ref 0.0–100.0)

## 2022-11-02 LAB — PHOSPHORUS: Phosphorus: 3.2 mg/dL (ref 2.5–4.6)

## 2022-11-02 MED ORDER — FLUOXETINE HCL 20 MG PO CAPS
20.0000 mg | ORAL_CAPSULE | Freq: Every day | ORAL | Status: DC
  Administered 2022-11-02 – 2022-11-04 (×3): 20 mg via ORAL
  Filled 2022-11-02 (×3): qty 1

## 2022-11-02 MED ORDER — METOPROLOL TARTRATE 5 MG/5ML IV SOLN
5.0000 mg | Freq: Four times a day (QID) | INTRAVENOUS | Status: DC | PRN
  Administered 2022-11-02: 5 mg via INTRAVENOUS
  Filled 2022-11-02: qty 5

## 2022-11-02 MED ORDER — DILTIAZEM HCL-DEXTROSE 125-5 MG/125ML-% IV SOLN (PREMIX)
5.0000 mg/h | INTRAVENOUS | Status: DC
  Administered 2022-11-02: 5 mg/h via INTRAVENOUS
  Administered 2022-11-03: 15 mg/h via INTRAVENOUS
  Filled 2022-11-02 (×2): qty 125

## 2022-11-02 MED ORDER — POLYETHYLENE GLYCOL 3350 17 G PO PACK: 17.0000 g | PACK | Freq: Every day | ORAL | Status: DC | PRN

## 2022-11-02 MED ORDER — ASPIRIN 81 MG PO TBEC
81.0000 mg | DELAYED_RELEASE_TABLET | Freq: Every day | ORAL | Status: DC
  Administered 2022-11-02 – 2022-11-04 (×3): 81 mg via ORAL
  Filled 2022-11-02 (×3): qty 1

## 2022-11-02 MED ORDER — MELATONIN 5 MG PO TABS
5.0000 mg | ORAL_TABLET | Freq: Every evening | ORAL | Status: DC | PRN
  Administered 2022-11-03: 5 mg via ORAL
  Filled 2022-11-02: qty 1

## 2022-11-02 MED ORDER — ALPRAZOLAM 0.25 MG PO TABS: 0.2500 mg | ORAL_TABLET | Freq: Three times a day (TID) | ORAL | Status: DC | PRN

## 2022-11-02 MED ORDER — METOPROLOL TARTRATE 50 MG PO TABS
50.0000 mg | ORAL_TABLET | Freq: Two times a day (BID) | ORAL | Status: DC
  Administered 2022-11-02: 50 mg via ORAL
  Filled 2022-11-02: qty 1

## 2022-11-02 MED ORDER — ACETAMINOPHEN 325 MG PO TABS
650.0000 mg | ORAL_TABLET | Freq: Four times a day (QID) | ORAL | Status: DC | PRN
  Filled 2022-11-02: qty 2

## 2022-11-02 MED ORDER — PANTOPRAZOLE SODIUM 40 MG PO TBEC
40.0000 mg | DELAYED_RELEASE_TABLET | Freq: Every day | ORAL | Status: DC
  Administered 2022-11-02 – 2022-11-04 (×3): 40 mg via ORAL
  Filled 2022-11-02 (×3): qty 1

## 2022-11-02 MED ORDER — SODIUM CHLORIDE 0.9 % IV SOLN: INTRAVENOUS | Status: DC

## 2022-11-02 MED ORDER — PROSIGHT PO TABS
1.0000 | ORAL_TABLET | Freq: Every day | ORAL | Status: DC
  Administered 2022-11-02 – 2022-11-04 (×3): 1 via ORAL
  Filled 2022-11-02 (×3): qty 1

## 2022-11-02 MED ORDER — MEDIHONEY WOUND/BURN DRESSING EX PSTE
1.0000 | PASTE | Freq: Every day | CUTANEOUS | Status: DC
  Administered 2022-11-02 – 2022-11-04 (×3): 1 via TOPICAL
  Filled 2022-11-02: qty 44

## 2022-11-02 MED ORDER — RIVAROXABAN 15 MG PO TABS
15.0000 mg | ORAL_TABLET | Freq: Every day | ORAL | Status: DC
  Administered 2022-11-02 – 2022-11-04 (×3): 15 mg via ORAL
  Filled 2022-11-02 (×3): qty 1

## 2022-11-02 MED ORDER — SODIUM CHLORIDE 0.9 % IV SOLN
2.0000 g | INTRAVENOUS | Status: DC
  Administered 2022-11-02 – 2022-11-03 (×2): 2 g via INTRAVENOUS
  Filled 2022-11-02 (×2): qty 20

## 2022-11-02 MED ORDER — VITAMIN D 25 MCG (1000 UNIT) PO TABS
3000.0000 [IU] | ORAL_TABLET | Freq: Every day | ORAL | Status: DC
  Administered 2022-11-02 – 2022-11-04 (×3): 3000 [IU] via ORAL
  Filled 2022-11-02 (×3): qty 3

## 2022-11-02 MED ORDER — PROCHLORPERAZINE EDISYLATE 10 MG/2ML IJ SOLN: 5.0000 mg | Freq: Four times a day (QID) | INTRAMUSCULAR | Status: DC | PRN

## 2022-11-02 MED ORDER — DILTIAZEM LOAD VIA INFUSION
10.0000 mg | Freq: Once | INTRAVENOUS | Status: AC
  Administered 2022-11-02: 10 mg via INTRAVENOUS
  Filled 2022-11-02: qty 10

## 2022-11-02 NOTE — H&P (Signed)
History and Physical  Kaitlin [name] [id number] DOB: [birth year] DOA: 11/01/2022  Referring physician: Accepted by Dr. Sidney Ace, [facility], hospitalist service. PCP: Prince Solian, MD  Outpatient Specialists: Cardiology Patient coming from: Home.  Chief Complaint: Confusion  HPI: Kaitlin [name] is a 77 y.o. female with medical history significant for coronary artery disease status post PCI, hypertension, hyperlipidemia, multiple sclerosis, paroxysmal A-fib on Xarelto, who initially presented to [facility] ED due to confusion and inability to ambulate with history of MS.  Workup in the ED revealed urinary tract infection.  Noncontrast head CT showed old infarct seen in the left basal ganglia, atrophy, small vessel disease with no acute intracranial findings.  Cultures were obtained and the patient received Rocephin empirically.  [facility], hospitalist service was asked to admit.  The patient was admitted by Dr. Trixie Rude and transferred to [facility] telemetry medical unit as inpatient status.  At the time of this visit the patient is alert and confused.  She has no new complaints.  ED Course: Tmax 98.1.  BP 160/63, pulse 67, respiratory rate 17, saturation 99% on room air.  Lab studies remarkable for BUN 35, creatinine 1.0 4 with GFR 55 at baseline.  Hemoglobin 9.1, WBC 10.6, platelet count 457.  Urine analysis positive for pyuria.  Review of Systems: Review of systems as noted in the HPI. All other systems reviewed and are negative.   Past Medical History:  Diagnosis Date   Abnormality of gait as late effect of stroke 02/23/2017   Acute hypoxemic respiratory failure    Anemia    Arthritis    Atrial fibrillation with RVR 02/17/2017   CAD S/P percutaneous coronary angioplasty 2001   Colon polyps    CVA (cerebral vascular accident) 01/2017   DDD (degenerative disc disease), cervical    Dysphagia as late effect of stroke 02/23/2017   H/O blood clots    HCVD (hypertensive cardiovascular  disease) 01/2017   normal LVF with moderate LVH, grade 1 DD   High cholesterol    HTN (hypertension)    Hypokalemia    Labile blood pressure    Leukocytosis    Multiple sclerosis    Osteopenia    PAF (paroxysmal atrial fibrillation) 01/2017   Right hemiparesis    Past Surgical History:  Procedure Laterality Date   CORONARY ANGIOPLASTY WITH STENT PLACEMENT  11/1999   RCA DES[facility]   IR ANGIO INTRA EXTRACRAN SEL COM CAROTID INNOMINATE UNI R MOD SED  02/15/2017   IR ANGIO VERTEBRAL SEL SUBCLAVIAN INNOMINATE UNI R MOD SED  02/15/2017   IR INTRAVSC STENT CERV CAROTID W/O EMB-PROT MOD SED INC ANGIO  02/15/2017   IR PERCUTANEOUS ART THROMBECTOMY/INFUSION INTRACRANIAL INC DIAG ANGIO  02/15/2017   IR RADIOLOGIST EVAL & MGMT  05/10/2017   NECK SURGERY     RADIOLOGY WITH ANESTHESIA N/A 02/14/2017   Procedure: RADIOLOGY WITH ANESTHESIA;  Surgeon: Radiologist, Medication, MD;  Location: [facility];  Service: Radiology;  Laterality: N/A;   WRIST SURGERY Left     Social History:  reports that she quit smoking about 39 years ago. Her smoking use included cigarettes. She has never used smokeless tobacco. She reports current alcohol use of about 3.0 standard drinks of alcohol per week. She reports that she does not use drugs.   Allergies  Allergen Reactions   Eliquis [Apixaban] Rash   Penicillins Other (See Comments)    From childhood     Family History  Problem Relation Age of Onset   Heart disease Father  Cancer Father        type unknown   Multiple sclerosis Sister    Ovarian cancer Mother    Colon cancer Neg Hx    Stomach cancer Neg Hx    Liver cancer Neg Hx    Pancreatic cancer Neg Hx    Rectal cancer Neg Hx       Prior to Admission medications   Medication Sig Start Date End Date Taking? Authorizing Provider  Ascorbic Acid (VITAMIN C) 500 MG CAPS Take 500 mg by mouth daily.    [provider]  aspirin EC 81 MG tablet Take 1 tablet (81 mg total) by mouth daily. Swallow  whole. 07/08/21   Freada Bergeron, MD  Calcium Citrate-Vitamin D (CALCIUM CITRATE + D PO) Take 1 tablet by mouth daily.    [provider]  cholecalciferol (VITAMIN D) 1000 units tablet Take 3,000 Units by mouth daily.    [provider]  Evolocumab (REPATHA SURECLICK) [id number] MG/ML SOAJ Inject 140 mg into the skin every 14 (fourteen) days. 08/10/22   Freada Bergeron, MD  ferrous sulfate 325 (65 FE) MG tablet Take 325 mg by mouth 2 (two) times daily with a meal.     [provider]  FLUoxetine (PROZAC) 20 MG capsule Take 20 mg by mouth daily. 02/08/22   [provider]  hydrochlorothiazide (MICROZIDE) 12.5 MG capsule TAKE 1 CAPSULE BY MOUTH EVERY DAY 01/15/20   Dorothy Spark, MD  lisinopril (ZESTRIL) 2.5 MG tablet Take 1 tablet (2.5 mg total) by mouth daily. 06/15/22   Freada Bergeron, MD  metoprolol tartrate (LOPRESSOR) 50 MG tablet Take 1 tablet (50 mg total) by mouth 2 (two) times daily. 09/06/22   Freada Bergeron, MD  Multiple Vitamins-Minerals (PRESERVISION AREDS PO) Take 1 tablet by mouth 2 (two) times daily.    [provider]  pantoprazole (PROTONIX) 40 MG tablet Take 1 tablet (40 mg total) by mouth daily. 03/19/17   Love, Ivan Anchors, PA-C  XARELTO 15 MG TABS tablet TAKE 1 TABLET (15 MG TOTAL) BY MOUTH DAILY WITH SUPPER. 08/14/19   Dorothy Spark, MD    Physical Exam: BP (!) 147/65 (BP Location: Left Arm)   Pulse 63   Temp 98.1 F (36.7 C) (Oral)   Resp 17   SpO2 99%   General: 77 y.o. year-old female well developed well nourished in no acute distress.  Alert and confused. Cardiovascular: Regular rate and rhythm with no rubs or gallops.  No thyromegaly or JVD noted.  No lower extremity edema. 2/4 pulses in all 4 extremities. Respiratory: Clear to auscultation with no wheezes or rales. Good inspiratory effort. Abdomen: Soft nontender nondistended with normal bowel sounds x4 quadrants. Muskuloskeletal: No cyanosis, clubbing or  edema noted bilaterally Neuro: CN II-XII intact, strength, sensation, reflexes Skin: No ulcerative lesions noted or rashes Psychiatry: Judgement and insight appear altered. Mood is appropriate for condition and setting          Labs on Admission:  Basic Metabolic Panel: Recent Labs  Lab 11/01/22 1549  NA 136  K 4.4  CL 104  CO2 23  GLUCOSE 127*  BUN 35*  CREATININE 1.04*  CALCIUM 9.5   Liver Function Tests: Recent Labs  Lab 11/01/22 1549  AST 16  ALT 11  ALKPHOS 69  BILITOT 0.4  PROT 6.9  ALBUMIN 3.7   No results for input(s): "LIPASE", "AMYLASE" in the last 168 hours. No results for input(s): "AMMONIA" in the last 168  hours. CBC: Recent Labs  Lab 11/01/22 1549  WBC 10.6*  NEUTROABS 8.3*  HGB 9.1*  HCT 29.4*  MCV 98.0  PLT 457*   Cardiac Enzymes: No results for input(s): "CKTOTAL", "CKMB", "CKMBINDEX", "TROPONINI" in the last 168 hours.  BNP (last 3 results) No results for input(s): "BNP" in the last 8760 hours.  ProBNP (last 3 results) No results for input(s): "PROBNP" in the last 8760 hours.  CBG: No results for input(s): "GLUCAP" in the last 168 hours.  Radiological Exams on Admission: CT Head Wo Contrast  Result Date: 11/01/2022 CLINICAL DATA:  Altered mental status, neurologic deficit EXAM: CT HEAD WITHOUT CONTRAST TECHNIQUE: Contiguous axial images were obtained from the base of the skull through the vertex without intravenous contrast. RADIATION DOSE REDUCTION: This exam was performed according to the departmental dose-optimization program which includes automated exposure control, adjustment of the mA and/or kV according to patient size and/or use of iterative reconstruction technique. COMPARISON:  Previous studies including the examination of 06/04/2017 FINDINGS: Brain: No acute intracranial findings are seen. There are no signs of bleeding within the cranium. There is interval clearing of small patchy parenchymal hemorrhage in the right frontal  cortex seen on the previous study. Left lateral ventricle is larger than right. There is old infarct in left basal ganglia. Cortical sulci are prominent. There is decreased density in periventricular and subcortical white matter. Vascular: Unremarkable. Skull: No acute findings are seen. Sinuses/Orbits: There is mild mucosal thickening in ethmoid and right maxillary sinuses. Other: No significant changes are noted. IMPRESSION: No acute intracranial findings are seen. Old infarct is seen in left basal ganglia. Atrophy. Small-vessel disease. Mild chronic sinusitis. Electronically Signed   By: Elmer Picker M.D.   On: 11/01/2022 18:36    EKG: I independently viewed the EKG done and my findings are as followed: Normal sinus rhythm rate of 63.  Nonspecific ST-T changes.  QTc 445.  Assessment/Plan Present on Admission:  Acute metabolic encephalopathy  Principal Problem:   Acute metabolic encephalopathy  Acute metabolic encephalopathy likely secondary to UTI, POA Treat underlying conditions Reorient as needed Fall, aspiration, delirium precautions  UTI, POA UA positive for pyuria Follow urine culture and blood cultures for ID and sensitivities Continue Rocephin empirically Monitor fever curve and WBC.  Ambulate dysfunction in the setting of MS Hold stroke PT OT assessment Fall precautions  Paroxysmal A-fib on Xarelto Resume home Xarelto Monitor on telemetry.  Chronic anxiety/depression Resume home regimen.  GERD Resume home PPI.  CKD 3A Appears to be at baseline with creatinine 1.04 with GFR 55 Avoid nephrotoxic agent, dehydration and hypotension Gentle IV fluid hydration NS at 50 cc/h x 2 days. Monitor urine output.   DVT prophylaxis: Home Xarelto  Code Status: Full code  Family Communication: None at bedside  Disposition Plan: Admitted to telemetry medical unit  Consults called: None.  Admission status: Inpatient status.   Status is: Inpatient The patient  requires at least 2 midnights for further evaluation and treatment of present condition.   Kayleen Memos MD [facility] Pager [phone]  If 7PM-7AM, please contact night-coverage [email or web address] Password [facility]  11/02/2022, 12:43 AM

## 2022-11-02 NOTE — Evaluation (Signed)
Occupational Therapy Evaluation Patient Details Name: Kaitlin [name] MRN: [id number] DOB: 1946-05-30 Today's Date: 11/02/2022   History of Present Illness Pt is a 77 y/o female presenting on 11/01/22 with confusion and inability to ambulate.  Found with acute metabolic encephalopathy from UTI. CT with old infarct but no acute findings.  PMH includes: MS, CAD, CVA, HTN, arthritis.   Clinical Impression   PTA patient living with her spouse, has caregivers 8 hours/day who assist with ADLs, transfers and w/c mobility.  Spouse reports she has not ambulated in 4 months, is a total assist for transfer, but she is able to feed and groom herself. Pt is oriented to self, follows simple commands but demonstrates decreased awareness and problem solving; spouse reports UTIs tend to increase confusion but she has baseline short term memory deficits.  She currently requires total assist +2 for transition to sitting EOB, but max assist +2 to roll; requires setup to total assist +2 for ADLs.  Will follow acutely to ensure ADLs and mobility returns to baseline, but with assist at home anticipate pt will not need further OT services after dc.      Recommendations for follow up therapy are one component of a multi-disciplinary discharge planning process, led by the attending physician.  Recommendations may be updated based on patient status, additional functional criteria and insurance authorization.   Assistance Recommended at Discharge Frequent or constant Supervision/Assistance  Patient can return home with the following Two people to help with walking and/or transfers;Two people to help with bathing/dressing/bathroom;Assistance with cooking/housework;Assistance with feeding;Direct supervision/assist for medications management;Direct supervision/assist for financial management;Assist for transportation;Help with stairs or ramp for entrance    Functional Status Assessment  Patient has had a recent decline in their  functional status and demonstrates the ability to make significant improvements in function in a reasonable and predictable amount of time.  Equipment Recommendations  None recommended by OT    Recommendations for Other Services       Precautions / Restrictions Precautions Precautions: Fall Precaution Comments: B LE contractures Restrictions Weight Bearing Restrictions: No      Mobility Bed Mobility Overal bed mobility: Needs Assistance Bed Mobility: Rolling, Sidelying to Sit, Sit to Sidelying Rolling: Max assist, +2 for physical assistance, +2 for safety/equipment Sidelying to sit: Total assist, +2 for physical assistance, +2 for safety/equipment     Sit to sidelying: Total assist, +2 for physical assistance, +2 for safety/equipment General bed mobility comments: pt able to reach with UEs to support rolling (L>R), requires total assist +2 to transition to EOB    Transfers                   General transfer comment: deferred      Balance Overall balance assessment: Needs assistance Sitting-balance support: Bilateral upper extremity supported, Feet supported Sitting balance-Leahy Scale: Poor Sitting balance - Comments: relies on external support, limited tolerance                                   ADL either performed or assessed with clinical judgement   ADL Overall ADL's : Needs assistance/impaired     Grooming: Bed level;Wash/dry face;Set up Grooming Details (indicate cue type and reason): would need more assist for bimanual tasks         Upper Body Dressing : Maximal assistance;Bed level   Lower Body Dressing: Total assistance;+2 for physical assistance;+2 for safety/equipment;Bed level  Toilet Transfer Details (indicate cue type and reason): deferred Toileting- Clothing Manipulation and Hygiene: Total assistance;+2 for physical assistance;+2 for safety/equipment;Bed level Toileting - Clothing Manipulation Details (indicate cue  type and reason): pure wick leaking upon entry, incontinent of bowel and requires total assist for clean up     Functional mobility during ADLs: Rolling walker (2 wheels);Total assistance;+2 for physical assistance;+2 for safety/equipment;Maximal assistance       Vision   Additional Comments: not assessed     Perception     Praxis      Pertinent Vitals/Pain Pain Assessment Pain Assessment: Faces Faces Pain Scale: Hurts little more Pain Location: generalized with movement Pain Descriptors / Indicators: Discomfort Pain Intervention(s): Limited activity within patient's tolerance, Monitored during session, Repositioned     Hand Dominance Left   Extremity/Trunk Assessment Upper Extremity Assessment Upper Extremity Assessment: Generalized weakness;RUE deficits/detail RUE Deficits / Details: hx of CVA with R sided deficits- shoulder flexion to 90* slow but decreased coordination and motor planning RUE Coordination: decreased fine motor;decreased gross motor   Lower Extremity Assessment Lower Extremity Assessment: Defer to PT evaluation   Cervical / Trunk Assessment Cervical / Trunk Assessment: Kyphotic   Communication Communication Communication: Expressive difficulties   Cognition Arousal/Alertness: Awake/alert Behavior During Therapy: WFL for tasks assessed/performed Overall Cognitive Status: History of cognitive impairments - at baseline Area of Impairment: Following commands, Memory, Problem solving, Awareness                     Memory: Decreased short-term memory Following Commands: Follows one step commands consistently, Follows one step commands with increased time, Follows multi-step commands inconsistently   Awareness: Intellectual Problem Solving: Slow processing, Requires verbal cues General Comments: spouse reports decreased STM at baseline and increased confusion with UTIs, She is oriented to self today and follows simple commands.     General  Comments  spouse present and supportive    Exercises     Shoulder Instructions      Home Living Family/patient expects to be discharged to:: Private residence Living Arrangements: Spouse/significant other Available Help at Discharge: Family;Personal care attendant Type of Home: House Home Access: Stairs to enter Technical brewer of Steps: [facility] bed;Wheelchair - manual   Additional Comments: incontient at baseline, uses diapers; did not get exact home setup, but per chart review in multilevel home.      Prior Functioning/Environment Prior Level of Function : Needs assist             Mobility Comments: assist for transfers into wheelchair, past 4 months non-ambulatory ADLs Comments: assist for ADLs, IADLs.  pt able to self feed and groom. She has assist from caregivers 8 hours/day        OT Problem List: Decreased strength;Decreased activity tolerance;Impaired balance (sitting and/or standing);Decreased coordination;Decreased cognition;Impaired UE functional use      OT Treatment/Interventions: DME and/or AE instruction;Balance training;Cognitive remediation/compensation;Therapeutic activities;Patient/family education;Self-care/ADL training;Neuromuscular education    OT Goals(Current goals can be found in the care plan section) Acute Rehab OT Goals Patient Stated Goal: none stated OT Goal Formulation: Patient unable to participate in goal setting Time For Goal Achievement: 11/16/22 Potential to Achieve Goals: Good  OT Frequency: Min 2X/week    Co-evaluation PT/OT/SLP Co-Evaluation/Treatment: Yes Reason for Co-Treatment: Complexity of the patient's impairments (multi-system involvement)   OT goals addressed during session: ADL's and self-care  AM-PAC OT "6 Clicks" Daily Activity     Outcome Measure Help from another person eating meals?: A Little Help from another person taking care of  personal grooming?: A Little Help from another person toileting, which includes using toliet, bedpan, or urinal?: Total Help from another person bathing (including washing, rinsing, drying)?: A Lot Help from another person to put on and taking off regular upper body clothing?: A Lot Help from another person to put on and taking off regular lower body clothing?: Total 6 Click Score: 12   End of Session Nurse Communication: Mobility status;Other (comment) (pure wick)  Activity Tolerance: Patient tolerated treatment well Patient left: in bed;with call bell/phone within reach;with bed alarm set;with family/visitor present  OT Visit Diagnosis: Other abnormalities of gait and mobility (R26.89);Muscle weakness (generalized) (M62.81);Other symptoms and signs involving cognitive function;Hemiplegia and hemiparesis Hemiplegia - Right/Left: Right Hemiplegia - dominant/non-dominant: Non-Dominant Hemiplegia - caused by:  (chronic)                TimeID:[id number] OT Time Calculation (min): 34 min Charges:  OT General Charges $OT Visit: 1 Visit OT Evaluation $OT Eval Moderate Complexity: 1 Mod  Jolaine Artist, OT Acute Rehabilitation Services Office [phone]   Delight Stare 11/02/2022, 10:39 AM

## 2022-11-02 NOTE — Evaluation (Signed)
Physical Therapy Evaluation Patient Details Name: Kaitlin [name] MRN: [id number] DOB: 25-Jan-1946 Today's Date: 11/02/2022  History of Present Illness  Pt is a 77 y/o female presenting on 11/01/22 with confusion and inability to ambulate.  Found with acute metabolic encephalopathy from UTI. CT with old infarct but no acute findings.  PMH includes: MS, CAD, CVA, HTN, arthritis.  Clinical Impression  Pt admitted with above diagnosis. Pt from home with husband and caregivers 8 hours per day. She has been unable to ambulate past 4 mos but caregivers have been putting her in w/c each day and she spends most of the day there. On eval pt with excessive tightness B knee flexors, hip flexors, and hip adductors. Able to come to sitting EOB with max A +2 but unable to sit in 90/90 position that she would be in in her w/c. Will follow acutely to help her return to ability to sit in w/c at home. No post acute therapy services recommended at this time.  Pt currently with functional limitations due to the deficits listed below (see PT Problem List). Pt will benefit from acute skilled PT to increase their independence and safety with mobility to allow discharge.          Recommendations for follow up therapy are one component of a multi-disciplinary discharge planning process, led by the attending physician.  Recommendations may be updated based on patient status, additional functional criteria and insurance authorization.  Follow Up Recommendations       Assistance Recommended at Discharge Frequent or constant Supervision/Assistance  Patient can return home with the following  A lot of help with walking and/or transfers;A lot of help with bathing/dressing/bathroom;Help with stairs or ramp for entrance    Equipment Recommendations None recommended by PT  Recommendations for Other Services       Functional Status Assessment Patient has had a recent decline in their functional status and demonstrates the  ability to make significant improvements in function in a reasonable and predictable amount of time.     Precautions / Restrictions Precautions Precautions: Fall Precaution Comments: B LE contractures Restrictions Weight Bearing Restrictions: No      Mobility  Bed Mobility Overal bed mobility: Needs Assistance Bed Mobility: Rolling, Sidelying to Sit, Sit to Sidelying Rolling: Max assist, +2 for physical assistance, +2 for safety/equipment Sidelying to sit: Total assist, +2 for physical assistance, +2 for safety/equipment     Sit to sidelying: Total assist, +2 for physical assistance, +2 for safety/equipment General bed mobility comments: pt able to reach with UEs to support rolling (L>R), requires total assist +2 to transition to EOB. LE's supported EOB and unable to achieve 90/90 position with hips and knees    Transfers                   General transfer comment: deferred, recommend use of lift    Ambulation/Gait               General Gait Details: has been unable to ambulate x4 mos  Stairs            Wheelchair Mobility    Modified Rankin (Stroke Patients Only)       Balance Overall balance assessment: Needs assistance Sitting-balance support: Bilateral upper extremity supported, Feet supported Sitting balance-Leahy Scale: Poor Sitting balance - Comments: relies on external support, limited tolerance  Pertinent Vitals/Pain Pain Assessment Pain Assessment: Faces Faces Pain Scale: Hurts little more Pain Location: generalized with movement Pain Descriptors / Indicators: Discomfort Pain Intervention(s): Limited activity within patient's tolerance, Monitored during session    Home Living Family/patient expects to be discharged to:: Private residence Living Arrangements: Spouse/significant other Available Help at Discharge: Family;Personal care attendant Type of Home: House Home Access:  Stairs to enter   Technical brewer of Steps: 1   Home Layout: Multi-level Home Equipment: Hospital bed;Wheelchair - manual Additional Comments: incontent at baseline, uses diapers; did not get exact home setup, but per chart review in multilevel home.    Prior Function Prior Level of Function : Needs assist             Mobility Comments: assist for transfers into wheelchair, past 4 months non-ambulatory and NWB through BLE's but has been able to tolerate sitting in her w/c throughout the day ADLs Comments: assist for ADLs, IADLs.  pt able to self feed and groom. She has assist from caregivers 8 hours/day     Hand Dominance   Dominant Hand: Left    Extremity/Trunk Assessment   Upper Extremity Assessment Upper Extremity Assessment: Defer to OT evaluation RUE Deficits / Details: hx of CVA with R sided deficits- shoulder flexion to 90* slow but decreased coordination and motor planning RUE Coordination: decreased fine motor;decreased gross motor    Lower Extremity Assessment Lower Extremity Assessment: RLE deficits/detail;LLE deficits/detail;Generalized weakness RLE Deficits / Details: pt with excessive tightness knee flexors, hip flexors, hip adductors, keeps BLE's pulled into chest in bed and ankles crossed, knees together. RLE Sensation: WNL RLE Coordination: decreased gross motor LLE Deficits / Details: mildly stronger than R side but same ROM limitations LLE Sensation: WNL LLE Coordination: decreased gross motor    Cervical / Trunk Assessment Cervical / Trunk Assessment: Kyphotic  Communication   Communication: Expressive difficulties  Cognition Arousal/Alertness: Awake/alert Behavior During Therapy: WFL for tasks assessed/performed Overall Cognitive Status: History of cognitive impairments - at baseline Area of Impairment: Following commands, Memory, Problem solving, Awareness                     Memory: Decreased short-term memory Following  Commands: Follows one step commands consistently, Follows one step commands with increased time, Follows multi-step commands inconsistently   Awareness: Intellectual Problem Solving: Slow processing, Requires verbal cues General Comments: spouse reports decreased STM at baseline and increased confusion with UTIs, She is oriented to self today and follows simple commands.        General Comments General comments (skin integrity, edema, etc.): pt with sacral wounds. Spose present and supportive. He states that when she does not have a UTI she is much more verbal though what she says is often incorrect    Exercises     Assessment/Plan    PT Assessment Patient needs continued PT services  PT Problem List Decreased range of motion;Decreased strength;Decreased activity tolerance;Decreased balance;Decreased mobility;Pain;Decreased skin integrity       PT Treatment Interventions DME instruction;Functional mobility training;Therapeutic activities;Therapeutic exercise;Balance training;Neuromuscular re-education;Patient/family education    PT Goals (Current goals can be found in the Care Plan section)  Acute Rehab PT Goals Patient Stated Goal: return home PT Goal Formulation: With patient/family Time For Goal Achievement: 11/16/22 Potential to Achieve Goals: Fair    Frequency Min 3X/week     Co-evaluation PT/OT/SLP Co-Evaluation/Treatment: Yes Reason for Co-Treatment: Complexity of the patient's impairments (multi-system involvement) PT goals addressed during session: Mobility/safety with mobility;Strengthening/ROM;Balance OT goals  addressed during session: ADL's and self-care       AM-PAC PT "6 Clicks" Mobility  Outcome Measure Help needed turning from your back to your side while in a flat bed without using bedrails?: A Lot Help needed moving from lying on your back to sitting on the side of a flat bed without using bedrails?: Total Help needed moving to and from a bed to a chair  (including a wheelchair)?: Total Help needed standing up from a chair using your arms (e.g., wheelchair or bedside chair)?: Total Help needed to walk in hospital room?: Total Help needed climbing 3-5 steps with a railing? : Total 6 Click Score: 7    End of Session   Activity Tolerance: Patient tolerated treatment well Patient left: in bed;with call bell/phone within reach;with bed alarm set;with family/visitor present Nurse Communication: Mobility status PT Visit Diagnosis: Muscle weakness (generalized) (M62.81);Pain;Other (comment) (decreased ROM) Pain - part of body:  (generalized)    Time: [id number] PT Time Calculation (min) (ACUTE ONLY): 35 min   Charges:   PT Evaluation $PT Eval Moderate Complexity: [place] chat preferred Office [place] 11/02/2022, 11:05 AM

## 2022-11-02 NOTE — Consult Note (Addendum)
[facility] Nurse Consult Note: Reason for Consult: Consult requested for several wounds.  Pt is contracted and incontinent of urine and stool and it is difficult to keep the affected areas from becoming soiled. Changed linens and cleaned patient.  Wound type: Left ischium with Stage 2 pressure injury; 2X2X.1cm, red and moist Left knee Unstageable pressure injury; 100% yellow slough Right buttock with Unstageable pressure injury; 80% yellow, 20% red, 6X6cm Pressure Injury POA: Yes Dressing procedure/placement/frequency: Topical treatment orders provided for bedside nurses to perform as follows to assist with removal of nonviable tissue and promote healing:  1. Apply Medihoney to left knee and right buttock wounds Q day, then cover with foam dresing.  Change foam dressings Q 3 days or PRN soiling. 2. Foam dressing to left ischium, change Q 3 days or PRN soiling Please re-consult if further assistance is needed.  Thank-you,  Julien Girt MSN, [place], [place], [place], [place]

## 2022-11-02 NOTE — Progress Notes (Signed)
[facility] Progress Note  Patient: Kaitlin [name]    [id number]  DOA: 11/01/2022    Date of Service: the patient was seen and examined on 11/02/2022  Brief hospital course: Patient is a 77 year old female with past medical history of previous CVA, hypertension, paroxysmal atrial fibrillation on Xarelto and MS plus recurrent urinary tract infections who in the last 6 months has become bedbound/nonambulatory who was brought into the emergency room on 4/1 for worsening confusion.  Workup revealed large urinary tract infection and patient was brought into the hospitalist service for further evaluation. Patient started on IV Rocephin.  Following admission, urine culture is back for greater than 100,000 colonies of gram-negative rods.  By afternoon of 4/2, patient went into rapid atrial fibrillation.   Assessment and Plan: Acute metabolic encephalopathy secondary to urinary tract infection: Sepsis ruled out.  On IV Rocephin.  Stable, mentation improving according to husband although not yet back to baseline.  Awaiting sensitivities.  Atrial fibrillation with rapid ventricular rate: Initially when patient presented with dehydration, Lopressor was held.  Patient started becoming more tachycardic and then went into atrial fibrillation.  Already on Xarelto.  Attempted IV Lopressor which was not successful.  Have started Cardizem drip.  MS: Patient on Repatha.  No severe exacerbations lately.  However overall functional decline.  Patient now with contractures and she has not walked in 4 months.  PT and OT following, but she is wheelchair-bound.  Pressure ulcers: Present on admission with an unstageable right buttock ulcer, unstageable ulcer of the left anterior knee and a stage II ischial tuberosity ulcer.  Appreciate wound care nurse with recommendations.  Anxiety: Continue patient's SSRI.    Body mass index is 23.27 kg/m.    Pressure Injury 11/01/22 Buttocks Right Unstageable - Full  thickness tissue loss in which the base of the injury is covered by slough (yellow, tan, gray, green or brown) and/or eschar (tan, brown or black) in the wound bed. (Active)  11/01/22 2331  Location: Buttocks  Location Orientation: Right  Staging: Unstageable - Full thickness tissue loss in which the base of the injury is covered by slough (yellow, tan, gray, green or brown) and/or eschar (tan, brown or black) in the wound bed.  Wound Description (Comments):   Present on Admission: Yes  Dressing Type Foam - Lift dressing to assess site every shift 11/02/22 1015     Pressure Injury 11/01/22 Knee Anterior;Left Unstageable - Full thickness tissue loss in which the base of the injury is covered by slough (yellow, tan, gray, green or brown) and/or eschar (tan, brown or black) in the wound bed. (Active)  11/01/22 2331  Location: Knee  Location Orientation: Anterior;Left  Staging: Unstageable - Full thickness tissue loss in which the base of the injury is covered by slough (yellow, tan, gray, green or brown) and/or eschar (tan, brown or black) in the wound bed.  Wound Description (Comments):   Present on Admission: Yes  Dressing Type Foam - Lift dressing to assess site every shift 11/02/22 1015     Pressure Injury 11/01/22 Ischial tuberosity Left Stage 2 -  Partial thickness loss of dermis presenting as a shallow open injury with a red, pink wound bed without slough. (Active)  11/01/22 2330  Location: Ischial tuberosity  Location Orientation: Left  Staging: Stage 2 -  Partial thickness loss of dermis presenting as a shallow open injury with a red, pink wound bed without slough.  Wound Description (Comments):   Present on Admission: Yes  Dressing  Type Foam - Lift dressing to assess site every shift 11/02/22 1015     Consultants: None  Procedures: None  Antimicrobials: IV Rocephin 4/1-present  Code Status: Full code   Subjective: Patient quiet, no complaints  Objective: Stable,  vitals recorded before atrial fibrillation started Vitals:   11/02/22 1105 11/02/22 1505  BP: (!) 132/50 (!) 149/64  Pulse: 81 86  Resp: 16 16  Temp: 98 F (36.7 C) 98.9 F (37.2 C)  SpO2: 100% 100%   No intake or output data in the 24 hours ending 11/02/22 1817 Filed Weights   11/01/22 2331 11/02/22 0302  Weight: 57.6 kg 57.7 kg   Body mass index is 23.27 kg/m.  Exam:  General: Alert and oriented x 2, no acute distress HEENT: Normocephalic, atraumatic, mucous membranes slightly dry Cardiovascular: Regular rate and rhythm, S1-S2 (not in atrial fibrillation during exam) Respiratory: Clear to auscultation bilaterally Abdomen: Soft, nontender, nondistended, hypoactive bowel sounds Musculoskeletal: Legs are contracted Skin: Patient has pressure ulcers as described above Psychiatry: Some underlying confusion and may be component of mild underlying dementia Neurology: Lower extremity weakness MS with contractures with right-sided hemiplegia from previous CVA  Data Reviewed: Hemoglobin of 8.9  Disposition:  Status is: Inpatient Remains inpatient appropriate because:  -Control of atrial fibrillation -Sensitivities for urinary tract infection    Anticipated discharge date: 4/4  Family Communication: Husband at bedside DVT Prophylaxis:  Rivaroxaban (XARELTO) tablet 15 mg    Author: Annita Brod ,MD 11/02/2022 6:17 PM  To reach On-call, see care teams to locate the attending and reach out via [email or web address]. Between 7PM-7AM, please contact night-coverage If you still have difficulty reaching the attending provider, please page the [facility] (Director on Call) for [facility] on amion for assistance.

## 2022-11-02 NOTE — Hospital Course (Signed)
Patient is a 77 year old female with past medical history of previous CVA, hypertension, paroxysmal atrial fibrillation on Xarelto and MS plus recurrent urinary tract infections who in the last 6 months has become bedbound/nonambulatory who was brought into the emergency room on 4/1 for worsening confusion.  Workup revealed large urinary tract infection and patient was brought into the hospitalist service for further evaluation. Patient started on IV Rocephin.  Following admission, urine culture is back for greater than 100,000 colonies of gram-negative rods.  By afternoon of 4/2, patient went into rapid atrial fibrillation.

## 2022-11-02 NOTE — Progress Notes (Signed)
  Transition of Care [facility]) Screening Note   Patient Details  Name: Kaitlin [name] Date of Birth: 06-Jun-1946   Transition of Care [facility]) CM/SW Contact:    Pollie Friar, RN Phone Number: 11/02/2022, 2:48 PM   Pt is from home with her spouse. She also has a caregiver that assists at home. CM will meet with pt, spouse tomorrow. Transition of Care Department [facility]) has reviewed patient. We will continue to monitor patient advancement through interdisciplinary progression rounds. If new patient transition needs arise, please place a TOC consult.

## 2022-11-03 DIAGNOSIS — G9341 Metabolic encephalopathy: Secondary | ICD-10-CM | POA: Diagnosis not present

## 2022-11-03 DIAGNOSIS — N390 Urinary tract infection, site not specified: Secondary | ICD-10-CM | POA: Diagnosis not present

## 2022-11-03 DIAGNOSIS — R531 Weakness: Secondary | ICD-10-CM

## 2022-11-03 LAB — URINE CULTURE: Culture: >=100000 — AB

## 2022-11-03 LAB — COMPREHENSIVE METABOLIC PANEL
ALT: 15 U/L (ref 0–44)
AST: 29 U/L (ref 15–41)
Albumin: 2.9 g/dL — ABNORMAL LOW (ref 3.5–5.0)
Alkaline Phosphatase: 60 U/L (ref 38–126)
Anion gap: 13 (ref 5–15)
BUN: 16 mg/dL (ref 8–23)
CO2: 19 mmol/L — ABNORMAL LOW (ref 22–32)
Calcium: 8.9 mg/dL (ref 8.9–10.3)
Chloride: 103 mmol/L (ref 98–111)
Creatinine, Ser: 0.83 mg/dL (ref 0.44–1.00)
GFR, Estimated: >60 mL/min (ref >60)
Glucose, Bld: 143 mg/dL — ABNORMAL HIGH (ref 70–99)
Potassium: 3.9 mmol/L (ref 3.5–5.1)
Sodium: 135 mmol/L (ref 135–145)
Total Bilirubin: 0.6 mg/dL (ref 0.3–1.2)
Total Protein: 6.2 g/dL — ABNORMAL LOW (ref 6.5–8.1)

## 2022-11-03 LAB — CBC
HCT: 27.8 % — ABNORMAL LOW (ref 36.0–46.0)
Hemoglobin: 8.6 g/dL — ABNORMAL LOW (ref 12.0–15.0)
MCH: 30.1 pg (ref 26.0–34.0)
MCHC: 30.9 g/dL (ref 30.0–36.0)
MCV: 97.2 fL (ref 80.0–100.0)
Platelets: 438 10*3/uL — ABNORMAL HIGH (ref 150–400)
RBC: 2.86 MIL/uL — ABNORMAL LOW (ref 3.87–5.11)
RDW: 15 % (ref 11.5–15.5)
WBC: 11.2 10*3/uL — ABNORMAL HIGH (ref 4.0–10.5)
nRBC: 0 % (ref 0.0–0.2)

## 2022-11-03 LAB — MAGNESIUM: Magnesium: 1.8 mg/dL (ref 1.7–2.4)

## 2022-11-03 MED ORDER — CEPHALEXIN 250 MG PO CAPS
250.0000 mg | ORAL_CAPSULE | Freq: Two times a day (BID) | ORAL | Status: DC
  Administered 2022-11-04: 250 mg via ORAL
  Filled 2022-11-03 (×2): qty 1

## 2022-11-03 MED ORDER — METOPROLOL TARTRATE 50 MG PO TABS
75.0000 mg | ORAL_TABLET | Freq: Two times a day (BID) | ORAL | Status: DC
  Administered 2022-11-03 – 2022-11-04 (×3): 75 mg via ORAL
  Filled 2022-11-03 (×3): qty 1

## 2022-11-03 NOTE — TOC Initial Note (Signed)
Transition of Care [facility]) - Initial/Assessment Note    Patient Details  Name: Kaitlin [name] MRN: [id number] Date of Birth: 06-22-1946  Transition of Care [facility]) CM/SW Contact:    Pollie Friar, RN Phone Number: 11/03/2022, 11:41 AM  Clinical Narrative:                 Pt is from home with her spouse. She has a caregiver 8 hours a day/ 7 days a week.  Pt with some wounds from home. CM inquired about her mobility at home with spouse. He states she is in bed about 12 hours a day and up in her wheelchair the other part of the time. CM went over the need to make sure she is being turned more frequently in the bed and that her weight is being shifted frequently in the chair. Spouse voiced understanding.  Spouse inquired about wound care at home. CM has asked the bedside RN to go over wound care with the spouse or caregiver. HHRN arranged through [place] for further education at home and monitoring of wounds at home.  Spouse over sees her medications at home and he denies any issues. [facility] [address] [facility]. Pt unable to get into car for appointments. CM will provide information on wheelchair transport services on AV[place]. Spouse is also going to call her insurance to see if they have transportation benefits. CM will also add information on physicians that see pts in home. Spouse requesting ambulance transport home when she is ready for d/c.  TOC following.  Expected Discharge Plan: [place] Barriers to Discharge: Continued Medical Work up   Patient Goals and CMS Choice   CMS Medicare.gov Compare Post Acute Care list provided to:: Patient Represenative (must comment) Choice offered to / list presented to : Patient, Spouse      Expected Discharge Plan and Services   Discharge Planning Services: CM Consult Post Acute Care Choice: [place] arrangements for the past 2 months: [place]:  RN [place] Agency: [place] Date [place]: 11/03/22   Representative spoke with at [place]: Claiborne Billings  Prior Living Arrangements/Services Living arrangements for the past 2 months: [place] Lives with:: Spouse Patient language and need for interpreter reviewed:: Yes Do you feel safe going back to the place where you live?: Yes      Need for Family Participation in Patient Care: Yes (Comment) Care giver support system in place?: Yes (comment) Current home services: DME (hospital bed/ wheelchair/ walkers) Criminal Activity/Legal Involvement Pertinent to Current Situation/Hospitalization: No - Comment as needed  Activities of Daily Living Home Assistive Devices/Equipment:  (Patient was unable to tell me) ADL Screening (condition at time of admission) Patient's cognitive ability adequate to safely complete daily activities?: No Is the patient deaf or have difficulty hearing?: Yes Does the patient have difficulty seeing, even when wearing glasses/contacts?: No Does the patient have difficulty concentrating, remembering, or making decisions?: No Patient able to express need for assistance with ADLs?: Yes Does the patient have difficulty dressing or bathing?: Yes Independently performs ADLs?: No Communication: Independent Dressing (OT): Needs assistance Is this a change from baseline?: Pre-admission baseline Grooming: Needs assistance Is this a change from baseline?: Pre-admission baseline Feeding: Independent with device (comment) Bathing: Needs assistance Is this a  change from baseline?: Pre-admission baseline Toileting: Needs assistance Is this a change from baseline?: Pre-admission baseline In/Out Bed: Dependent Is this a change from baseline?: Pre-admission baseline Walks in Home: Dependent Is this a change from baseline?: Pre-admission baseline Does the patient have difficulty walking or climbing stairs?: Yes Weakness of Legs: Both Weakness of  Arms/Hands: None  Permission Sought/Granted                  Emotional Assessment Appearance:: Appears stated age Attitude/Demeanor/Rapport:  (confused)   Orientation: : Oriented to Self   Psych Involvement: No (comment)  Admission diagnosis:  Weakness [R53.1] Urinary tract infection without hematuria, site unspecified [id number] Acute metabolic encephalopathy [id number] Patient Active Problem List   Diagnosis Date Noted   Pressure injury of skin 11/02/2022   UTI (urinary tract infection) 11/02/2022   Anxiety [id number]   Acute metabolic encephalopathy [id number]   Statin myopathy 07/12/2019   Elevated liver function tests 05/02/2018   Elevated alanine aminotransferase (ALT) level 05/02/2018   Elevated AST (SGOT) 05/02/2018   History of stroke 11/30/2017   Cognitive deficit, post-stroke 09/30/2017   Subarachnoid hemorrhage 06/04/2017   Multiple sclerosis 05/12/2017   CAD (coronary artery disease) 04/12/2017   Hypertension 04/11/2017   Right hemiparesis    Aspiration pneumonia of right lung    Leukocytosis    Hypokalemia    Labile blood pressure    Abnormality of gait as late effect of stroke 02/23/2017   Dysarthria as late effect of stroke 02/23/2017   Dysphagia as late effect of stroke 02/23/2017   Acute ischemic left middle cerebral artery (MCA) stroke 02/22/2017   Paroxysmal A-fib 02/17/2017   Atrial fibrillation with RVR 02/17/2017   HLD (hyperlipidemia) 02/17/2017   Acute hypoxemic respiratory failure    Stroke (cerebrum) (HCC) - Acute MCA infarct due to left CCA/ICA and MCA occlusion, s/p mechanical thrombectomy and left ICA stenting in the setting of newly diagnosed afib 02/14/2017   Atherosclerotic cardiovascular disease 11/07/2013   PCP:  Prince Solian, MD Pharmacy:   [id number] [place], [place] [id number] [place] [id number] Phone: [phone] Fax:  [phone]     Social Determinants of Health (SDOH) Social History: SDOH Screenings   Tobacco Use: Medium Risk (11/01/2022)   SDOH Interventions:     Readmission Risk Interventions     No data to display

## 2022-11-03 NOTE — Progress Notes (Signed)
Physical Therapy Treatment Patient Details Name: Kaitlin [name] MRN: [id number] DOB: Aug 21, 1945 Today's Date: 11/03/2022   History of Present Illness Pt is a 77 y/o female presenting on 11/01/22 with confusion and inability to ambulate.  Found with acute metabolic encephalopathy from UTI. CT with old infarct but no acute findings.  PMH includes: MS, CAD, CVA, HTN, arthritis.    PT Comments    Focused session on providing gentle prolonged stretch to pt's bil knees into extension and bil hips into abduction and extension as her limited ROM impacts her ability to sit up in her w/c. Pt continues to lack at least 20 degrees of bil knee extension to even achieve a 90 degree knee flexion position for sitting upright in a chair. Remainder of session focused on bed mobility and sitting up EOB to eventually progress to sitting up in the chair. Pt was eventually able to progress to a min guard assist level for static sitting balance when she had x1-2 UE support and feet support on the bed rail dropped under her buttocks. Will continue to follow acutely.     Recommendations for follow up therapy are one component of a multi-disciplinary discharge planning process, led by the attending physician.  Recommendations may be updated based on patient status, additional functional criteria and insurance authorization.  Follow Up Recommendations       Assistance Recommended at Discharge Frequent or constant Supervision/Assistance  Patient can return home with the following A lot of help with walking and/or transfers;A lot of help with bathing/dressing/bathroom;Help with stairs or ramp for entrance;Assistance with cooking/housework;Direct supervision/assist for medications management;Direct supervision/assist for financial management;Assist for transportation   Equipment Recommendations  Other (comment) (hoyer lift)    Recommendations for Other Services       Precautions / Restrictions Precautions Precautions:  Fall Precaution Comments: B LE contractures Restrictions Weight Bearing Restrictions: No     Mobility  Bed Mobility Overal bed mobility: Needs Assistance Bed Mobility: Rolling, Sidelying to Sit, Sit to Sidelying Rolling: Max assist Sidelying to sit: Total assist, HOB elevated     Sit to sidelying: Total assist, HOB elevated General bed mobility comments: pt able to reach with UEs to support rolling from R to L, but requires total assist to transition to EOB and back to supine. Pt needing blocking to prevent sliding off EOB when transitioned to sit EOB.    Transfers                   General transfer comment: deferred, recommend use of lift    Ambulation/Gait               General Gait Details: has been unable to ambulate x4 mos   Stairs             Wheelchair Mobility    Modified Rankin (Stroke Patients Only)       Balance Overall balance assessment: Needs assistance Sitting-balance support: Bilateral upper extremity supported, Feet supported, Single extremity supported Sitting balance-Leahy Scale: Poor Sitting balance - Comments: Pt initially requiring maxA for balance and to block her from sliding off EOB. Once repositioned with better buttocks support on EOB and pt propping feet up on bed rail under her buttocks she was able to progress to min guard assist for 1-2 UE support with intermittent minA.       Standing balance comment: deferred  Cognition Arousal/Alertness: Awake/alert Behavior During Therapy: WFL for tasks assessed/performed, Anxious Overall Cognitive Status: History of cognitive impairments - at baseline Area of Impairment: Following commands, Memory, Problem solving, Awareness                     Memory: Decreased short-term memory Following Commands: Follows one step commands with increased time, Follows multi-step commands inconsistently, Follows one step commands  inconsistently   Awareness: Intellectual Problem Solving: Slow processing, Requires verbal cues, Decreased initiation, Difficulty sequencing, Requires tactile cues General Comments: spouse reports decreased STM at baseline and increased confusion with UTIs, She follows simple commands inconsistently        Exercises Other Exercises Other Exercises: PROM to knee into extension in combination with hip extension while supine, bil 1x ~2-3 min duration to pt tolerance Other Exercises: PROM to hips into abduction while supine, bil 1x ~2 min duration to pt tolerance    General Comments General comments (skin integrity, edema, etc.): placed pillow in between knees to reduce pressure on skin bil; pt rolled to R upon arrival thus allowed pt to lay flat (but pt rotates hips to R often) end of session      Pertinent Vitals/Pain Pain Assessment Pain Assessment: Faces Faces Pain Scale: Hurts even more Pain Location: generalized with movement and PROM of legs Pain Descriptors / Indicators: Discomfort, Grimacing, Guarding Pain Intervention(s): Monitored during session, Limited activity within patient's tolerance, Repositioned    Home Living                          Prior Function            PT Goals (current goals can now be found in the care plan section) Acute Rehab PT Goals Patient Stated Goal: return home PT Goal Formulation: Patient unable to participate in goal setting Time For Goal Achievement: 11/16/22 Potential to Achieve Goals: Fair Progress towards PT goals: Progressing toward goals    Frequency    Min 3X/week      PT Plan Current plan remains appropriate    Co-evaluation              AM-PAC PT "6 Clicks" Mobility   Outcome Measure  Help needed turning from your back to your side while in a flat bed without using bedrails?: A Lot Help needed moving from lying on your back to sitting on the side of a flat bed without using bedrails?: Total Help  needed moving to and from a bed to a chair (including a wheelchair)?: Total Help needed standing up from a chair using your arms (e.g., wheelchair or bedside chair)?: Total Help needed to walk in hospital room?: Total Help needed climbing 3-5 steps with a railing? : Total 6 Click Score: 7    End of Session   Activity Tolerance: Patient tolerated treatment well Patient left: in bed;with call bell/phone within reach;with bed alarm set;Other (comment) (with pillow between knees and pt returned to supine to change positions from initially being rolled to her R side upon arrival)   PT Visit Diagnosis: Muscle weakness (generalized) (M62.81);Pain;Other (comment) (decreased ROM) Pain - part of body:  (generalized)     Time: 1358-1430 PT Time Calculation (min) (ACUTE ONLY): 32 min  Charges:  $Therapeutic Exercise: 8-22 mins $Therapeutic Activity: 8-22 mins                     Moishe Spice, PT, DPT Acute Rehabilitation Services  Office: [place] 11/03/2022, 5:57 PM

## 2022-11-03 NOTE — Progress Notes (Signed)
Progress Note   Patient: Kaitlin [name] [id number] DOB: 09-26-1945 DOA: 11/01/2022     2 DOS: the patient was seen and examined on 11/03/2022   Brief hospital course: Patient is a 77 year old female with past medical history of previous CVA, hypertension, paroxysmal atrial fibrillation on Xarelto and MS plus recurrent urinary tract infections who in the last 6 months has become bedbound/nonambulatory who was brought into the emergency room on 4/1 for worsening confusion.  Workup revealed large urinary tract infection and patient was brought into the hospitalist service for further evaluation. Patient started on IV Rocephin.  Following admission, urine culture is back for greater than 100,000 colonies of gram-negative rods.  By afternoon of 4/2, patient went into rapid atrial fibrillation.  Assessment and Plan: Acute metabolic encephalopathy secondary to urinary tract infection:  Sepsis ruled out.   Had been on IV Rocephin.   -Mentation has improved -Urine culture confirms pan-sensitive ecoli. Will transition to keflex. Pt has tolerated rocephin without issues   Atrial fibrillation with rapid ventricular rate:  -Initially when patient presented with dehydration, Lopressor was held.   -Patient started becoming more tachycardic and then went into atrial fibrillation.  -Already on Xarelto.   -Improved with cardizem gtt, now back in sinus and Cardizem stopped -Metoprolol increased to 75mg  bid   MS: Patient on Repatha.  No severe exacerbations lately.  However overall functional decline.  Patient now with contractures and she has not walked in 4 months.  PT and OT following, but she is wheelchair-bound.   Pressure ulcers: Present on admission with an unstageable right buttock ulcer, unstageable ulcer of the left anterior knee and a stage II ischial tuberosity ulcer.  Appreciate wound care nurse with recommendations.   Anxiety: Continue patient's SSRI.   Body mass index is 23.27 kg/m.       Pressure Injury 11/01/22 Buttocks Right Unstageable - Full thickness tissue loss in which the base of the injury is covered by slough (yellow, tan, gray, green or brown) and/or eschar (tan, brown or black) in the wound bed. (Active)  11/01/22 2331  Location: Buttocks  Location Orientation: Right  Staging: Unstageable - Full thickness tissue loss in which the base of the injury is covered by slough (yellow, tan, gray, green or brown) and/or eschar (tan, brown or black) in the wound bed.  Wound Description (Comments):   Present on Admission: Yes  Dressing Type Foam - Lift dressing to assess site every shift 11/02/22 1015     Pressure Injury 11/01/22 Knee Anterior;Left Unstageable - Full thickness tissue loss in which the base of the injury is covered by slough (yellow, tan, gray, green or brown) and/or eschar (tan, brown or black) in the wound bed. (Active)  11/01/22 2331  Location: Knee  Location Orientation: Anterior;Left  Staging: Unstageable - Full thickness tissue loss in which the base of the injury is covered by slough (yellow, tan, gray, green or brown) and/or eschar (tan, brown or black) in the wound bed.  Wound Description (Comments):   Present on Admission: Yes  Dressing Type Foam - Lift dressing to assess site every shift 11/02/22 1015     Pressure Injury 11/01/22 Ischial tuberosity Left Stage 2 -  Partial thickness loss of dermis presenting as a shallow open injury with a red, pink wound bed without slough. (Active)  11/01/22 2330  Location: Ischial tuberosity  Location Orientation: Left  Staging: Stage 2 -  Partial thickness loss of dermis presenting as a shallow open injury with a red,  pink wound bed without slough.  Wound Description (Comments):   Present on Admission: Yes  Dressing Type Foam - Lift dressing to assess site every shift 11/02/22 1015       Subjective: Pleasantly confused. Family reports pt's mentation has improved  Physical Exam: Vitals:   11/03/22 0822  11/03/22 1101 11/03/22 1237 11/03/22 1547  BP: (!) 93/57 (!) 86/49 (!) 103/47 134/88  Pulse: 94 (!) 59 68 65  Resp: 18 18  16   Temp: 98.1 F (36.7 C) 98.2 F (36.8 C)  98.3 F (36.8 C)  TempSrc:      SpO2: 99% 98%  99%  Weight:      Height:       General exam: Awake, laying in bed, in nad Respiratory system: Normal respiratory effort, no wheezing Cardiovascular system: regular rate, s1, s2 Gastrointestinal system: Soft, nondistended, positive BS Central nervous system: CN2-12 grossly intact, strength intact Extremities: Perfused, no clubbing Skin: Normal skin turgor, no notable skin lesions seen Psychiatry: Mood normal // no visual hallucinations   Data Reviewed:  Labs reviewed: Na 135, K 3.9, Cr 0.83, WBC 11.2, Hgb 8.6   Family Communication: Pt in room, husband at bedside  Disposition: Status is: Inpatient Remains inpatient appropriate because: Severity of illness  Planned Discharge Destination: Home     Author: Marylu Lund, MD 11/03/2022 6:17 PM  For on call review [email or web address].

## 2022-11-03 NOTE — Plan of Care (Signed)
  Problem: Education: Goal: Knowledge of General Education information will improve Description Including pain rating scale, medication(s)/side effects and non-pharmacologic comfort measures Outcome: Progressing   

## 2022-11-04 ENCOUNTER — Other Ambulatory Visit (HOSPITAL_COMMUNITY): Payer: Self-pay

## 2022-11-04 DIAGNOSIS — R69 Illness, unspecified: Secondary | ICD-10-CM | POA: Diagnosis not present

## 2022-11-04 DIAGNOSIS — R531 Weakness: Secondary | ICD-10-CM | POA: Diagnosis not present

## 2022-11-04 DIAGNOSIS — R1084 Generalized abdominal pain: Secondary | ICD-10-CM | POA: Diagnosis not present

## 2022-11-04 DIAGNOSIS — G8190 Hemiplegia, unspecified affecting unspecified side: Secondary | ICD-10-CM | POA: Diagnosis not present

## 2022-11-04 DIAGNOSIS — G9341 Metabolic encephalopathy: Secondary | ICD-10-CM | POA: Diagnosis not present

## 2022-11-04 DIAGNOSIS — Z743 Need for continuous supervision: Secondary | ICD-10-CM | POA: Diagnosis not present

## 2022-11-04 DIAGNOSIS — Z7401 Bed confinement status: Secondary | ICD-10-CM | POA: Diagnosis not present

## 2022-11-04 DIAGNOSIS — N390 Urinary tract infection, site not specified: Secondary | ICD-10-CM | POA: Diagnosis not present

## 2022-11-04 LAB — COMPREHENSIVE METABOLIC PANEL
ALT: 15 U/L (ref 0–44)
AST: 30 U/L (ref 15–41)
Albumin: 2.7 g/dL — ABNORMAL LOW (ref 3.5–5.0)
Alkaline Phosphatase: 59 U/L (ref 38–126)
Anion gap: 8 (ref 5–15)
BUN: 17 mg/dL (ref 8–23)
CO2: 21 mmol/L — ABNORMAL LOW (ref 22–32)
Calcium: 8.8 mg/dL — ABNORMAL LOW (ref 8.9–10.3)
Chloride: 107 mmol/L (ref 98–111)
Creatinine, Ser: 0.71 mg/dL (ref 0.44–1.00)
GFR, Estimated: >60 mL/min (ref >60)
Glucose, Bld: 101 mg/dL — ABNORMAL HIGH (ref 70–99)
Potassium: 3.8 mmol/L (ref 3.5–5.1)
Sodium: 136 mmol/L (ref 135–145)
Total Bilirubin: 0.7 mg/dL (ref 0.3–1.2)
Total Protein: 5.6 g/dL — ABNORMAL LOW (ref 6.5–8.1)

## 2022-11-04 LAB — CBC
HCT: 26.4 % — ABNORMAL LOW (ref 36.0–46.0)
Hemoglobin: 8.1 g/dL — ABNORMAL LOW (ref 12.0–15.0)
MCH: 29.8 pg (ref 26.0–34.0)
MCHC: 30.7 g/dL (ref 30.0–36.0)
MCV: 97.1 fL (ref 80.0–100.0)
Platelets: 367 10*3/uL (ref 150–400)
RBC: 2.72 MIL/uL — ABNORMAL LOW (ref 3.87–5.11)
RDW: 15 % (ref 11.5–15.5)
WBC: 8.9 10*3/uL (ref 4.0–10.5)
nRBC: 0 % (ref 0.0–0.2)

## 2022-11-04 MED ORDER — ENSURE ENLIVE PO LIQD: 237.0000 mL | Freq: Two times a day (BID) | ORAL | Status: DC

## 2022-11-04 MED ORDER — CEPHALEXIN 250 MG PO CAPS
250.0000 mg | ORAL_CAPSULE | Freq: Two times a day (BID) | ORAL | 0 refills | Status: AC
  Filled 2022-11-04: qty 8, 4d supply, fill #0

## 2022-11-04 MED ORDER — METOPROLOL TARTRATE 25 MG PO TABS
75.0000 mg | ORAL_TABLET | Freq: Two times a day (BID) | ORAL | 0 refills | Status: AC
  Filled 2022-11-04: qty 180, 30d supply, fill #0

## 2022-11-04 NOTE — Discharge Summary (Signed)
Physician Discharge Summary   Patient: Kaitlin [name] MRN: [id number] DOB: [birth year]  Admit date:     11/01/2022  Discharge date: 11/04/22  Discharge Physician: Marylu Lund   PCP: Prince Solian, MD   Recommendations at discharge:   Follow up with PCP in 1-2 weeks Consider referral to [facility] to address recurrent UTI's   Discharge Diagnoses: Principal Problem:   Acute metabolic encephalopathy Active Problems:   Paroxysmal A-fib   HLD (hyperlipidemia)   Multiple sclerosis   Pressure injury of skin   UTI (urinary tract infection)   Anxiety  Resolved Problems:   * No resolved hospital problems. [facility] Course: Patient is a 77 year old female with past medical history of previous CVA, hypertension, paroxysmal atrial fibrillation on Xarelto and MS plus recurrent urinary tract infections who in the last 6 months has become bedbound/nonambulatory who was brought into the emergency room on 4/1 for worsening confusion.  Workup revealed large urinary tract infection and patient was brought into the hospitalist service for further evaluation. Patient started on IV Rocephin.  Following admission, urine culture is back for greater than 100,000 colonies of gram-negative rods.  By afternoon of 4/2, patient went into rapid atrial fibrillation.  Assessment and Plan: Acute metabolic encephalopathy secondary to urinary tract infection:  Sepsis ruled out.   Had been on IV Rocephin.   -Mentation has improved -Urine culture confirms pan-sensitive ecoli. Transitioned to keflex. Pt has tolerated rocephin without issues   Atrial fibrillation with rapid ventricular rate:  -Initially when patient presented with dehydration, Lopressor was held.   -Patient started becoming more tachycardic and then went into atrial fibrillation.  -Already on Xarelto.   -Improved with cardizem gtt, now back in sinus and Cardizem stopped -Metoprolol increased to 75mg  bid -defer further dose  adjustments to PCP and cardiology    MS: Patient on Tower.  No severe exacerbations lately.  However overall functional decline.  Patient now with contractures and she has not walked in 4 months.  PT and OT following, but she is wheelchair-bound.   Pressure ulcers: Present on admission with an unstageable right buttock ulcer, unstageable ulcer of the left anterior knee and a stage II ischial tuberosity ulcer.  Appreciate wound care nurse with recommendations.   Anxiety: Continue patient's SSRI.   Body mass index is 23.27 kg/m.         Pressure Injury 11/01/22 Buttocks Right Unstageable - Full thickness tissue loss in which the base of the injury is covered by slough (yellow, tan, gray, green or brown) and/or eschar (tan, brown or black) in the wound bed. (Active)  11/01/22 2331  Location: Buttocks  Location Orientation: Right  Staging: Unstageable - Full thickness tissue loss in which the base of the injury is covered by slough (yellow, tan, gray, green or brown) and/or eschar (tan, brown or black) in the wound bed.  Wound Description (Comments):   Present on Admission: Yes  Dressing Type Foam - Lift dressing to assess site every shift 11/02/22 1015     Pressure Injury 11/01/22 Knee Anterior;Left Unstageable - Full thickness tissue loss in which the base of the injury is covered by slough (yellow, tan, gray, green or brown) and/or eschar (tan, brown or black) in the wound bed. (Active)  11/01/22 2331  Location: Knee  Location Orientation: Anterior;Left  Staging: Unstageable - Full thickness tissue loss in which the base of the injury is covered by slough (yellow, tan, gray, green or brown) and/or eschar (tan, brown or black) in  the wound bed.  Wound Description (Comments):   Present on Admission: Yes  Dressing Type Foam - Lift dressing to assess site every shift 11/02/22 1015     Pressure Injury 11/01/22 Ischial tuberosity Left Stage 2 -  Partial thickness loss of dermis presenting as  a shallow open injury with a red, pink wound bed without slough. (Active)  11/01/22 2330  Location: Ischial tuberosity  Location Orientation: Left  Staging: Stage 2 -  Partial thickness loss of dermis presenting as a shallow open injury with a red, pink wound bed without slough.  Wound Description (Comments):   Present on Admission: Yes  Dressing Type Foam - Lift dressing to assess site every shift 11/02/22 1015         Consultants:  Procedures performed:   Disposition: Home Diet recommendation:  Regular diet DISCHARGE MEDICATION: Allergies as of 11/04/2022       Reactions   Eliquis [apixaban] Rash   Penicillins Other (See Comments)   From childhood        Medication List     STOP taking these medications    lisinopril 2.5 MG tablet Commonly known as: ZESTRIL       TAKE these medications    acetaminophen 500 MG tablet Commonly known as: TYLENOL Take 500 mg by mouth 2 (two) times daily as needed for mild pain or moderate pain.   aspirin EC 81 MG tablet Take 1 tablet (81 mg total) by mouth daily. Swallow whole. What changed: additional instructions   CALCIUM CITRATE + D PO Take 1 tablet by mouth daily.   cephALEXin 250 MG capsule Commonly known as: KEFLEX Take 1 capsule (250 mg total) by mouth every 12 (twelve) hours for 4 days.   cholecalciferol 1000 units tablet Commonly known as: VITAMIN D Take 3,000 Units by mouth daily.   ferrous sulfate 325 (65 FE) MG tablet Take 325 mg by mouth daily with breakfast.   FLUoxetine 20 MG capsule Commonly known as: PROZAC Take 20 mg by mouth daily.   metoprolol tartrate 25 MG tablet Commonly known as: LOPRESSOR Take 3 tablets (75 mg total) by mouth 2 (two) times daily. What changed:  medication strength how much to take   pantoprazole 40 MG tablet Commonly known as: PROTONIX Take 1 tablet (40 mg total) by mouth daily.   PRESERVISION AREDS PO Take 1 tablet by mouth 2 (two) times daily.   Repatha  SureClick [id number] MG/ML So[place] Generic drug: Evolocumab Inject 140 mg into the skin every 14 (fourteen) days.   Xarelto 15 MG Tabs tablet Generic drug: Rivaroxaban TAKE 1 TABLET (15 MG TOTAL) BY MOUTH DAILY WITH SUPPER. What changed: See the new instructions.        Follow-up Information     [facility] [place] Follow up.   Specialty: Home Health Services Why: The home health agency will contact you for the first home visit. Contact information: [address] [place] [address] [place] [facility] transportation Follow up.   Why: Call few days in advance for wheelchair transport needs Contact information: [phone]        [facility] transportation Follow up.   Why: Call few days in advance for wheelchair transport needs Contact information: [phone]        [facility] calls Follow up.   Why: May be beneficial for MD to see in home and her not to transport--just an option Contact information: [phone]  Discharge Exam: Filed Weights   11/02/22 0302 11/03/22 0456 11/04/22 0741  Weight: 57.7 kg 58.2 kg 58.2 kg   General exam: Awake, laying in bed, in nad Respiratory system: Normal respiratory effort, no wheezing Cardiovascular system: regular rate, s1, s2 Gastrointestinal system: Soft, nondistended, positive BS Central nervous system: CN2-12 grossly intact, strength intact Extremities: Perfused, no clubbing Skin: Normal skin turgor, no notable skin lesions seen Psychiatry: Mood normal // no visual hallucinations   Condition at discharge: fair  The results of significant diagnostics from this hospitalization (including imaging, microbiology, ancillary and laboratory) are listed below for reference.   Imaging Studies: CT Head Wo Contrast  Result Date: 11/01/2022 CLINICAL DATA:  Altered mental status, neurologic deficit EXAM: CT HEAD WITHOUT CONTRAST TECHNIQUE: Contiguous axial images were obtained from  the base of the skull through the vertex without intravenous contrast. RADIATION DOSE REDUCTION: This exam was performed according to the departmental dose-optimization program which includes automated exposure control, adjustment of the mA and/or kV according to patient size and/or use of iterative reconstruction technique. COMPARISON:  Previous studies including the examination of 06/04/2017 FINDINGS: Brain: No acute intracranial findings are seen. There are no signs of bleeding within the cranium. There is interval clearing of small patchy parenchymal hemorrhage in the right frontal cortex seen on the previous study. Left lateral ventricle is larger than right. There is old infarct in left basal ganglia. Cortical sulci are prominent. There is decreased density in periventricular and subcortical white matter. Vascular: Unremarkable. Skull: No acute findings are seen. Sinuses/Orbits: There is mild mucosal thickening in ethmoid and right maxillary sinuses. Other: No significant changes are noted. IMPRESSION: No acute intracranial findings are seen. Old infarct is seen in left basal ganglia. Atrophy. Small-vessel disease. Mild chronic sinusitis. Electronically Signed   By: Elmer Picker M.D.   On: 11/01/2022 18:36    Microbiology: Results for orders placed or performed during the hospital encounter of 11/01/22  Culture, blood (Routine x 2)     Status: None (Preliminary result)   Collection Time: 11/01/22  5:34 PM   Specimen: BLOOD  Result Value Ref Range Status   Specimen Description   Final    BLOOD BLOOD RIGHT FOREARM Performed at [facility], [address] [place], [place] [address]    Special Requests   Final    Blood Culture adequate volume BOTTLES DRAWN AEROBIC AND ANAEROBIC Performed at [facility], [address], [place], [place] [address]    Culture   Final    NO GROWTH 3 DAYS Performed at [facility], [place] [address]  [place], [place] [address]    Report Status PENDING  Incomplete  Urine Culture     Status: Abnormal   Collection Time: 11/01/22  7:31 PM   Specimen: Urine, Catheterized  Result Value Ref Range Status   Specimen Description   Final    URINE, CATHETERIZED Performed at [facility], [address], [place], [place] [address]    Special Requests   Final    NONE Performed at [facility], [address], [place], [place] [address]    Culture >=100,000 COLONIES/mL ESCHERICHIA COLI (A)  Final   Report Status 11/03/2022 FINAL  Final   Organism ID, Bacteria ESCHERICHIA COLI (A)  Final      Susceptibility   Escherichia coli - MIC*    AMPICILLIN 4 SENSITIVE Sensitive     CEFAZOLIN <=4 SENSITIVE Sensitive     CEFEPIME <=0.12 SENSITIVE Sensitive  CEFTRIAXONE <=0.25 SENSITIVE Sensitive     CIPROFLOXACIN <=0.25 SENSITIVE Sensitive     GENTAMICIN <=1 SENSITIVE Sensitive     IMIPENEM <=0.25 SENSITIVE Sensitive     NITROFURANTOIN 32 SENSITIVE Sensitive     TRIMETH/SULFA <=20 SENSITIVE Sensitive     AMPICILLIN/SULBACTAM <=2 SENSITIVE Sensitive     PIP/TAZO <=4 SENSITIVE Sensitive     * >=100,000 COLONIES/mL ESCHERICHIA COLI  Culture, blood (Routine x 2)     Status: None (Preliminary result)   Collection Time: 11/02/22  2:56 AM   Specimen: BLOOD  Result Value Ref Range Status   Specimen Description BLOOD BLOOD LEFT ARM  Final   Special Requests   Final    BOTTLES DRAWN AEROBIC AND ANAEROBIC Blood Culture results may not be optimal due to an excessive volume of blood received in culture bottles   Culture   Final    NO GROWTH 2 DAYS Performed at [facility], [place] [address], [place], [place] [address]    Report Status PENDING  Incomplete    Labs: CBC: Recent Labs  Lab 11/01/22 1549 11/02/22 0616 11/03/22 1015 11/04/22 0905  WBC 10.6* 8.2 11.2* 8.9  NEUTROABS 8.3*  --   --   --   HGB 9.1* 8.9* 8.6* 8.1*  HCT 29.4* 28.6* 27.8* 26.4*  MCV  98.0 96.6 97.2 97.1  PLT 457* 413* 438* [id number]   Basic Metabolic Panel: Recent Labs  Lab 11/01/22 1549 11/02/22 0616 11/03/22 1015 11/04/22 0905  NA 136 136 135 136  K 4.4 4.1 3.9 3.8  CL 104 103 103 107  CO2 23 23 19* 21*  GLUCOSE 127* 103* 143* 101*  BUN 35* 24* 16 17  CREATININE 1.04* 0.82 0.83 0.71  CALCIUM 9.5 9.0 8.9 8.8*  MG  --  1.9 1.8  --   PHOS  --  3.2  --   --    Liver Function Tests: Recent Labs  Lab 11/01/22 1549 11/03/22 1015 11/04/22 0905  AST 16 29 30   ALT 11 15 15   ALKPHOS 69 60 59  BILITOT 0.4 0.6 0.7  PROT 6.9 6.2* 5.6*  ALBUMIN 3.7 2.9* 2.7*   CBG: No results for input(s): "GLUCAP" in the last 168 hours.  Discharge time spent: less than 30 minutes.  Signed: Marylu Lund, MD [facility] 11/04/2022

## 2022-11-04 NOTE — Care Management Important Message (Signed)
Important Message  Patient Details  Name: Kaitlin [name] MRN: [id number] Date of Birth: 17-Dec-1945   Medicare Important Message Given:  Yes     [name] 11/04/2022, 3:54 PM

## 2022-11-04 NOTE — Plan of Care (Signed)
  Problem: Education: Goal: Knowledge of General Education information will improve Description: Including pain rating scale, medication(s)/side effects and non-pharmacologic comfort measures 11/04/2022 1507 by Emmaline Life, RN Outcome: Adequate for Discharge 11/04/2022 0815 by Emmaline Life, RN Outcome: Progressing   Problem: Health Behavior/Discharge Planning: Goal: Ability to manage health-related needs will improve Outcome: Adequate for Discharge   Problem: Clinical Measurements: Goal: Ability to maintain clinical measurements within normal limits will improve Outcome: Adequate for Discharge Goal: Will remain free from infection Outcome: Adequate for Discharge Goal: Diagnostic test results will improve Outcome: Adequate for Discharge Goal: Respiratory complications will improve Outcome: Adequate for Discharge Goal: Cardiovascular complication will be avoided Outcome: Adequate for Discharge   Problem: Activity: Goal: Risk for activity intolerance will decrease Outcome: Adequate for Discharge   Problem: Nutrition: Goal: Adequate nutrition will be maintained Outcome: Adequate for Discharge   Problem: Coping: Goal: Level of anxiety will decrease Outcome: Adequate for Discharge   Problem: Elimination: Goal: Will not experience complications related to bowel motility Outcome: Adequate for Discharge Goal: Will not experience complications related to urinary retention Outcome: Adequate for Discharge   Problem: Pain Managment: Goal: General experience of comfort will improve Outcome: Adequate for Discharge   Problem: Safety: Goal: Ability to remain free from injury will improve Outcome: Adequate for Discharge   Problem: Skin Integrity: Goal: Risk for impaired skin integrity will decrease Outcome: Adequate for Discharge   Problem: Acute Rehab OT Goals (only OT should resolve) Goal: Pt. Will Perform Eating Outcome: Adequate for Discharge Goal: Pt. Will Perform  Grooming Outcome: Adequate for Discharge Goal: OT Additional ADL Goal #1 Outcome: Adequate for Discharge Goal: OT Additional ADL Goal #2 Outcome: Adequate for Discharge   Problem: Acute Rehab PT Goals(only PT should resolve) Goal: Pt Will Go Supine/Side To Sit Outcome: Adequate for Discharge Goal: Pt Will Go Sit To Supine/Side Outcome: Adequate for Discharge Goal: Patient Will Perform Sitting Balance Outcome: Adequate for Discharge Goal: Pt Will Transfer Bed To Chair/Chair To Bed Outcome: Adequate for Discharge Goal: Pt/caregiver will Perform Home Exercise Program Outcome: Adequate for Discharge   Problem: Increased Nutrient Needs (NI-5.1) Goal: Food and/or nutrient delivery Description: Individualized approach for food/nutrient provision. Outcome: Adequate for Discharge

## 2022-11-04 NOTE — Discharge Instructions (Signed)
Wound Care::  1. Apply Medihoney to left knee and right buttock wounds Q day, then cover with foam dresing.  Change foam dressings Q 3 days or PRN soiling. 2. Foam dressing to left ischium, change Q 3 days or PRN soiling

## 2022-11-04 NOTE — Progress Notes (Signed)
Occupational Therapy Treatment Patient Details Name: Kaitlin [name] MRN: [id number] DOB: 1945/08/27 Today's Date: 11/04/2022   History of present illness Pt is a 77 y/o female presenting on 11/01/22 with confusion and inability to ambulate.  Found with acute metabolic encephalopathy from UTI. CT with old infarct but no acute findings.  PMH includes: MS, CAD, CVA, HTN, arthritis.   OT comments  Patient supine in bed, just finished PT session.  Upright in bed, pt completed grooming tasks with setup assist.  Requires encouragement to complete herself, spouse understands importance of continuing to encourage her at home.  Educated and completed self stretch to R hand for wrist and digits 3-4 to avoid further tightening and improve functional use of hand.  Spouse reports she has IADL activities (turning magazine pages with R hand) at home she enjoys, encouraged her to continue this at home.  Spouse reports comfortable with level of assist, given caregivers at home. Will follow acutely, recommendations remain the same.    Recommendations for follow up therapy are one component of a multi-disciplinary discharge planning process, led by the attending physician.  Recommendations may be updated based on patient status, additional functional criteria and insurance authorization.    Assistance Recommended at Discharge Frequent or constant Supervision/Assistance  Patient can return home with the following  Two people to help with walking and/or transfers;Two people to help with bathing/dressing/bathroom;Assistance with cooking/housework;Assistance with feeding;Direct supervision/assist for medications management;Direct supervision/assist for financial management;Assist for transportation;Help with stairs or ramp for entrance   Equipment Recommendations  None recommended by OT    Recommendations for Other Services      Precautions / Restrictions Precautions Precautions: Fall Precaution Comments: B LE  contractures Restrictions Weight Bearing Restrictions: No       Mobility Bed Mobility Overal bed mobility: Needs Assistance             General bed mobility comments: remained in bed, HOB elevated for grooming tasks    Transfers                   General transfer comment: deferred, recommend use of lift     Balance                                           ADL either performed or assessed with clinical judgement   ADL Overall ADL's : Needs assistance/impaired     Grooming: Set up;Wash/dry face;Oral care Grooming Details (indicate cue type and reason): upright in bed, setup for tasks completed                                    Extremity/Trunk Assessment              Vision       Perception     Praxis      Cognition Arousal/Alertness: Awake/alert Behavior During Therapy: WFL for tasks assessed/performed, Anxious Overall Cognitive Status: History of cognitive impairments - at baseline Area of Impairment: Following commands, Memory, Problem solving, Awareness                     Memory: Decreased short-term memory Following Commands: Follows one step commands with increased time, Follows multi-step commands inconsistently, Follows one step commands inconsistently   Awareness: Intellectual Problem Solving: Slow processing, Requires verbal  cues, Decreased initiation, Difficulty sequencing, Requires tactile cues General Comments: minimal verbalizations, but when asked to complete grooming tasks pt reports "can you do it?".        Exercises Exercises: Other exercises Other Exercises Other Exercises: PROM and gentle stretch to R UE, educated pt and spouse on modified prayer stretch to avoid further tightness to digits 3-4 and wrist    Shoulder Instructions       General Comments      Pertinent Vitals/ Pain       Pain Assessment Pain Assessment: Faces Faces Pain Scale: No hurt Pain  Intervention(s): Monitored during session  Home Living                                          Prior Functioning/Environment              Frequency  Min 2X/week        Progress Toward Goals  OT Goals(current goals can now be found in the care plan section)  Progress towards OT goals: Progressing toward goals  Acute Rehab OT Goals Patient Stated Goal: none stated OT Goal Formulation: Patient unable to participate in goal setting Time For Goal Achievement: 11/16/22 Potential to Achieve Goals: Good  Plan Discharge plan remains appropriate;Frequency remains appropriate    Co-evaluation                 AM-PAC OT "6 Clicks" Daily Activity     Outcome Measure   Help from another person eating meals?: A Little Help from another person taking care of personal grooming?: A Little Help from another person toileting, which includes using toliet, bedpan, or urinal?: Total Help from another person bathing (including washing, rinsing, drying)?: A Lot Help from another person to put on and taking off regular upper body clothing?: A Lot Help from another person to put on and taking off regular lower body clothing?: Total 6 Click Score: 12    End of Session    OT Visit Diagnosis: Other abnormalities of gait and mobility (R26.89);Muscle weakness (generalized) (M62.81);Other symptoms and signs involving cognitive function;Hemiplegia and hemiparesis Hemiplegia - Right/Left: Right Hemiplegia - dominant/non-dominant: Non-Dominant   Activity Tolerance Patient tolerated treatment well   Patient Left in bed;with call bell/phone within reach;with bed alarm set;with family/visitor present   Nurse Communication Mobility status        Time: [id number] OT Time Calculation (min): 16 min  Charges: OT General Charges $OT Visit: 1 Visit OT Treatments $Self Care/Home Management : 8-22 mins  Jolaine Artist, [place] Office  [phone]   Delight Stare 11/04/2022, 10:18 AM

## 2022-11-04 NOTE — Progress Notes (Signed)
Physical Therapy Treatment Patient Details Name: Kaitlin [name] MRN: [id number] DOB: 1945/08/11 Today's Date: 11/04/2022   History of Present Illness Pt is a 77 y/o female presenting on 11/01/22 with confusion and inability to ambulate.  Found with acute metabolic encephalopathy from UTI. CT with old infarct but no acute findings.  PMH includes: MS, CAD, CVA, HTN, arthritis.    PT Comments    Pt asleep upon PT arrival and initially resisting mvmt and husband reports she does this in the mornings at home as well. However, after stretching to BLE's in supine, rolling R and L to increase mvmt, and sitting up EOB, pt became more alert and more agreeable to participate. After 5 mins sitting EOB pt able to relax through hip and knee flexors to achieve close to 90/90 in sitting. Gave pt's husband a link to order a better w/c cushion for pt at home due to her wounds. Recliner in room too deep for pt to sit in, her husband may bring w/c up for her to sit in in room. Pt maintained sitting EOB >20 mins with min guard A. Changed rec to HHPT. PT will continue to follow.    Recommendations for follow up therapy are one component of a multi-disciplinary discharge planning process, led by the attending physician.  Recommendations may be updated based on patient status, additional functional criteria and insurance authorization.  Follow Up Recommendations       Assistance Recommended at Discharge Frequent or constant Supervision/Assistance  Patient can return home with the following A lot of help with walking and/or transfers;A lot of help with bathing/dressing/bathroom;Help with stairs or ramp for entrance;Assistance with cooking/housework;Direct supervision/assist for medications management;Direct supervision/assist for financial management;Assist for transportation   Equipment Recommendations  Other (comment) (hoyer lift)    Recommendations for Other Services       Precautions / Restrictions  Precautions Precautions: Fall Precaution Comments: B LE contractures Restrictions Weight Bearing Restrictions: No     Mobility  Bed Mobility Overal bed mobility: Needs Assistance Bed Mobility: Rolling, Sidelying to Sit, Sit to Sidelying Rolling: Max assist Sidelying to sit: HOB elevated, Max assist     Sit to sidelying: HOB elevated, Max assist General bed mobility comments: worked on rolling side to side before coming to sitting, for trunk dissociation    Transfers                   General transfer comment: pt will not be able to tolerate deep reciliner with her contractures. Asked husband to bring in her w/c. Also gave him a link to order a better cushion for her in w/c due to wonds    Ambulation/Gait               General Gait Details: has been unable to ambulate x4 mos   Stairs             Wheelchair Mobility    Modified Rankin (Stroke Patients Only)       Balance Overall balance assessment: Needs assistance Sitting-balance support: Bilateral upper extremity supported, Feet supported, Single extremity supported Sitting balance-Leahy Scale: Poor Sitting balance - Comments: pt initially with knees pulled up and feet on bed rail that was down but as she continued to sit, began to relax hip and knee flexors and was able to achieve close to 90 deg hip flex and 90 deg knee flex. Able to sit with min guard while she ate her breakfast with no UE supported  Standing balance comment: unable                            Cognition Arousal/Alertness: Awake/alert Behavior During Therapy: WFL for tasks assessed/performed, Anxious Overall Cognitive Status: History of cognitive impairments - at baseline Area of Impairment: Following commands, Memory, Problem solving, Awareness                     Memory: Decreased short-term memory Following Commands: Follows one step commands with increased time, Follows multi-step commands  inconsistently, Follows one step commands inconsistently   Awareness: Intellectual Problem Solving: Slow processing, Requires verbal cues, Decreased initiation, Difficulty sequencing, Requires tactile cues General Comments: minimal verbalizations, but followed commands.        Exercises Other Exercises Other Exercises: PROM to knee flexors, hip flexors, and hip adductors. 5 mins each muscle group before sitting up    General Comments General comments (skin integrity, edema, etc.): pillow between knees in all positions. Pt able to feed self but would not pick up food with fork on her own      Pertinent Vitals/Pain Pain Assessment Pain Assessment: Faces Faces Pain Scale: Hurts even more Pain Location: L knee, crepitus felt withh mvmt Pain Descriptors / Indicators: Discomfort, Grimacing, Guarding Pain Intervention(s): Limited activity within patient's tolerance, Monitored during session    Home Living                          Prior Function            PT Goals (current goals can now be found in the care plan section) Acute Rehab PT Goals Patient Stated Goal: return home PT Goal Formulation: Patient unable to participate in goal setting Time For Goal Achievement: 11/16/22 Potential to Achieve Goals: Fair Progress towards PT goals: Progressing toward goals    Frequency    Min 3X/week      PT Plan Discharge plan needs to be updated    Co-evaluation              AM-PAC PT "6 Clicks" Mobility   Outcome Measure  Help needed turning from your back to your side while in a flat bed without using bedrails?: A Lot Help needed moving from lying on your back to sitting on the side of a flat bed without using bedrails?: Total Help needed moving to and from a bed to a chair (including a wheelchair)?: Total Help needed standing up from a chair using your arms (e.g., wheelchair or bedside chair)?: Total Help needed to walk in hospital room?: Total Help needed  climbing 3-5 steps with a railing? : Total 6 Click Score: 7    End of Session   Activity Tolerance: Patient tolerated treatment well Patient left: in bed;with call bell/phone within reach;with bed alarm set;with family/visitor present Nurse Communication: Mobility status PT Visit Diagnosis: Muscle weakness (generalized) (M62.81);Pain;Other (comment) (decreased ROM) Pain - part of body: Knee (generalized)     Time: [id number] PT Time Calculation (min) (ACUTE ONLY): 42 min  Charges:  $Therapeutic Exercise: 8-22 mins $Therapeutic Activity: 23-37 mins                     Francis chat preferred Office [place] 11/04/2022, 12:18 PM

## 2022-11-04 NOTE — Plan of Care (Signed)
  Problem: Education: Goal: Knowledge of General Education information will improve Description Including pain rating scale, medication(s)/side effects and non-pharmacologic comfort measures Outcome: Progressing   

## 2022-11-04 NOTE — Progress Notes (Signed)
Patient d/c home, transported by [facility] services. Called husband and reviewed discharge instruction, RX's and follow up appts verbalized understanding. Husband aware of d/c plan.  Kenny Stern, Tivis Ringer, RN

## 2022-11-04 NOTE — Plan of Care (Signed)
  Problem: Education: Goal: Knowledge of General Education information will improve Description: Including pain rating scale, medication(s)/side effects and non-pharmacologic comfort measures Outcome: Progressing   Problem: Health Behavior/Discharge Planning: Goal: Ability to manage health-related needs will improve Outcome: Progressing   Problem: Clinical Measurements: Goal: Will remain free from infection Outcome: Progressing   

## 2022-11-04 NOTE — TOC Transition Note (Signed)
Transition of Care [facility]) - CM/SW Discharge Note   Patient Details  Name: Kaitlin [name] MRN: [id number] Date of Birth: [birth year]  [facility] CM/SW Contact:  Pollie Friar, RN Phone Number: 11/04/2022, 1:45 PM   Clinical Narrative:    Pt is discharging home today. CM has verified home address with spouse. Spouse requesting a 3:30 pm pick up by ambulance. CM has scheduled a 3:30 pick up with [facility]. Bedside RN updated. D/c packet is at the desk.    Final next level of care: Home w Home Health Services Barriers to Discharge: No Barriers Identified   Patient Goals and CMS Choice CMS Medicare.gov Compare Post Acute Care list provided to:: Patient Represenative (must comment) Choice offered to / list presented to : Patient, Spouse  Discharge Placement                         Discharge Plan and Services Additional resources added to the After Visit Summary for     Discharge Planning Services: CM Consult Post Acute Care Choice: Home Health                    HH Arranged: RN [place]: [place] Date [place]: 11/03/22   Representative spoke with at [place]: Claiborne Billings  Social Determinants of Health ([place]) Interventions SDOH Screenings   Tobacco Use: Medium Risk (11/01/2022)     Readmission Risk Interventions     No data to display

## 2022-11-04 NOTE — Progress Notes (Signed)
Initial Nutrition Assessment  DOCUMENTATION CODES:   Not applicable  INTERVENTION:  Liberalize to regular with ordering assistance Continue MVI Ensure Enlive po BID, each supplement provides 350 kcal and 20 grams of protein.  NUTRITION DIAGNOSIS:   Increased nutrient needs related to wound healing as evidenced by estimated needs.  GOAL:   Patient will meet greater than or equal to 90% of their needs  MONITOR:   PO intake, Supplement acceptance, Weight trends, Skin  REASON FOR ASSESSMENT:   Malnutrition Screening Tool    ASSESSMENT:   Pt with hx of MS, HLD, HTN, CAD, atrial fibrillation, hx CVA, and recurrent UTIs presented to ED with AMS. Found to have severe UTI. Pt has been bound for ~4-5 months and has developed wounds.  Pt resting in bed at the time of assessment. Husband at bedside provides the majority of the history. Reports that pt generally has a good appetite and they eat three meals a day at home.   Went over a typical day of food intake: Breakfast: fruit and a breakfast sandwich (usually an english muffin with egg and or cheese/breakfast meat) Lunch: 1/2 sandwich and chips Dinner: cooked meal, makes a lot of soups (most recetnly had shrimp and corn chowder)  Per husband, pt ranged from 110-120 lb all of her life until after she had her stroke in 2018. Gained up to 150 lbs at that time. Does think that she has lost some weight from that time, but not a significant change that he has noticed.   Pt has been WC or bed bound for the last few months. Did discuss that increasing protein could be protective in helping to heal and prevent wounds. Pt and husband agreeable. Brought ensure plus and pt liked the chocolate flavor. Will add BID while admitted.   Nutritionally Relevant Medications: Scheduled Meds:  cephALEXin  250 mg Oral Q12H   cholecalciferol  3,000 Units Oral Daily   multivitamin  1 tablet Oral Daily   pantoprazole  40 mg Oral Daily   PRN Meds:  polyethylene glycol, prochlorperazine  Labs Reviewed  NUTRITION - FOCUSED PHYSICAL EXAM: Flowsheet Row Most Recent Value  Orbital Region No depletion  Upper Arm Region No depletion  Thoracic and Lumbar Region No depletion  Buccal Region No depletion  Temple Region No depletion  Clavicle Bone Region No depletion  Clavicle and Acromion Bone Region Mild depletion  Scapular Bone Region Mild depletion  Dorsal Hand Mild depletion  Patellar Region Moderate depletion  Anterior Thigh Region Moderate depletion  Posterior Calf Region Moderate depletion  Edema (RD Assessment) None  Hair Reviewed  Eyes Reviewed  Mouth Reviewed  Skin Reviewed  Nails Reviewed   Diet Order:   Diet Order             Diet regular Room service appropriate? Yes with Assist; Fluid consistency: Thin  Diet effective now                   EDUCATION NEEDS:   Education needs have been addressed  Skin:  Skin Assessment: Reviewed RN Assessment Unstageable: Right buttocks, left knee Stage 2: Ischial tuberosity  Last BM:  4/3 - type 5  Height:  Ht Readings from Last 1 Encounters:  11/01/22 5\' 2"  (1.575 m)    Weight:  Wt Readings from Last 1 Encounters:  11/04/22 58.2 kg    Ideal Body Weight:  50 kg  BMI:  Body mass index is 23.47 kg/m.  Estimated Nutritional Needs:  Kcal:  1300-1500  kcal/d Protein:  65-80g/d Fluid:  >/=1.5L/d    Ranell Patrick, RD, LDN Clinical Dietitian RD pager # available in [facility]  After hours/weekend pager # available in [facility]

## 2022-11-06 DIAGNOSIS — L89892 Pressure ulcer of other site, stage 2: Secondary | ICD-10-CM | POA: Diagnosis not present

## 2022-11-06 DIAGNOSIS — Z7901 Long term (current) use of anticoagulants: Secondary | ICD-10-CM | POA: Diagnosis not present

## 2022-11-06 DIAGNOSIS — L89322 Pressure ulcer of left buttock, stage 2: Secondary | ICD-10-CM | POA: Diagnosis not present

## 2022-11-06 DIAGNOSIS — I2510 Atherosclerotic heart disease of native coronary artery without angina pectoris: Secondary | ICD-10-CM | POA: Diagnosis not present

## 2022-11-06 DIAGNOSIS — I129 Hypertensive chronic kidney disease with stage 1 through stage 4 chronic kidney disease, or unspecified chronic kidney disease: Secondary | ICD-10-CM | POA: Diagnosis not present

## 2022-11-06 DIAGNOSIS — M6289 Other specified disorders of muscle: Secondary | ICD-10-CM | POA: Diagnosis not present

## 2022-11-06 DIAGNOSIS — L89210 Pressure ulcer of right hip, unstageable: Secondary | ICD-10-CM | POA: Diagnosis not present

## 2022-11-06 DIAGNOSIS — Z7982 Long term (current) use of aspirin: Secondary | ICD-10-CM | POA: Diagnosis not present

## 2022-11-06 DIAGNOSIS — L89890 Pressure ulcer of other site, unstageable: Secondary | ICD-10-CM | POA: Diagnosis not present

## 2022-11-06 DIAGNOSIS — Z86010 Personal history of colonic polyps: Secondary | ICD-10-CM | POA: Diagnosis not present

## 2022-11-06 DIAGNOSIS — D631 Anemia in chronic kidney disease: Secondary | ICD-10-CM | POA: Diagnosis not present

## 2022-11-06 DIAGNOSIS — Z955 Presence of coronary angioplasty implant and graft: Secondary | ICD-10-CM | POA: Diagnosis not present

## 2022-11-06 DIAGNOSIS — G35 Multiple sclerosis: Secondary | ICD-10-CM | POA: Diagnosis not present

## 2022-11-06 DIAGNOSIS — I69351 Hemiplegia and hemiparesis following cerebral infarction affecting right dominant side: Secondary | ICD-10-CM | POA: Diagnosis not present

## 2022-11-06 DIAGNOSIS — N390 Urinary tract infection, site not specified: Secondary | ICD-10-CM | POA: Diagnosis not present

## 2022-11-06 DIAGNOSIS — I480 Paroxysmal atrial fibrillation: Secondary | ICD-10-CM | POA: Diagnosis not present

## 2022-11-06 DIAGNOSIS — M8580 Other specified disorders of bone density and structure, unspecified site: Secondary | ICD-10-CM | POA: Diagnosis not present

## 2022-11-06 DIAGNOSIS — G9341 Metabolic encephalopathy: Secondary | ICD-10-CM | POA: Diagnosis not present

## 2022-11-06 DIAGNOSIS — I69391 Dysphagia following cerebral infarction: Secondary | ICD-10-CM | POA: Diagnosis not present

## 2022-11-06 DIAGNOSIS — N1831 Chronic kidney disease, stage 3a: Secondary | ICD-10-CM | POA: Diagnosis not present

## 2022-11-06 DIAGNOSIS — Z87891 Personal history of nicotine dependence: Secondary | ICD-10-CM | POA: Diagnosis not present

## 2022-11-06 DIAGNOSIS — F419 Anxiety disorder, unspecified: Secondary | ICD-10-CM | POA: Diagnosis not present

## 2022-11-06 DIAGNOSIS — B9620 Unspecified Escherichia coli [E. coli] as the cause of diseases classified elsewhere: Secondary | ICD-10-CM | POA: Diagnosis not present

## 2022-11-06 DIAGNOSIS — L89312 Pressure ulcer of right buttock, stage 2: Secondary | ICD-10-CM | POA: Diagnosis not present

## 2022-11-06 DIAGNOSIS — E7800 Pure hypercholesterolemia, unspecified: Secondary | ICD-10-CM | POA: Diagnosis not present

## 2022-11-06 DIAGNOSIS — M5030 Other cervical disc degeneration, unspecified cervical region: Secondary | ICD-10-CM | POA: Diagnosis not present

## 2022-11-06 LAB — CULTURE, BLOOD (ROUTINE X 2)
Culture: NO GROWTH
Special Requests: ADEQUATE

## 2022-11-07 LAB — CULTURE, BLOOD (ROUTINE X 2): Culture: NO GROWTH

## 2022-11-10 DIAGNOSIS — I129 Hypertensive chronic kidney disease with stage 1 through stage 4 chronic kidney disease, or unspecified chronic kidney disease: Secondary | ICD-10-CM | POA: Diagnosis not present

## 2022-11-10 DIAGNOSIS — M8580 Other specified disorders of bone density and structure, unspecified site: Secondary | ICD-10-CM | POA: Diagnosis not present

## 2022-11-10 DIAGNOSIS — G9341 Metabolic encephalopathy: Secondary | ICD-10-CM | POA: Diagnosis not present

## 2022-11-10 DIAGNOSIS — I2510 Atherosclerotic heart disease of native coronary artery without angina pectoris: Secondary | ICD-10-CM | POA: Diagnosis not present

## 2022-11-10 DIAGNOSIS — I69351 Hemiplegia and hemiparesis following cerebral infarction affecting right dominant side: Secondary | ICD-10-CM | POA: Diagnosis not present

## 2022-11-10 DIAGNOSIS — L89890 Pressure ulcer of other site, unstageable: Secondary | ICD-10-CM | POA: Diagnosis not present

## 2022-11-10 DIAGNOSIS — I69391 Dysphagia following cerebral infarction: Secondary | ICD-10-CM | POA: Diagnosis not present

## 2022-11-10 DIAGNOSIS — Z86010 Personal history of colonic polyps: Secondary | ICD-10-CM | POA: Diagnosis not present

## 2022-11-10 DIAGNOSIS — F419 Anxiety disorder, unspecified: Secondary | ICD-10-CM | POA: Diagnosis not present

## 2022-11-10 DIAGNOSIS — B9620 Unspecified Escherichia coli [E. coli] as the cause of diseases classified elsewhere: Secondary | ICD-10-CM | POA: Diagnosis not present

## 2022-11-10 DIAGNOSIS — L89312 Pressure ulcer of right buttock, stage 2: Secondary | ICD-10-CM | POA: Diagnosis not present

## 2022-11-10 DIAGNOSIS — Z955 Presence of coronary angioplasty implant and graft: Secondary | ICD-10-CM | POA: Diagnosis not present

## 2022-11-10 DIAGNOSIS — L89210 Pressure ulcer of right hip, unstageable: Secondary | ICD-10-CM | POA: Diagnosis not present

## 2022-11-10 DIAGNOSIS — M5030 Other cervical disc degeneration, unspecified cervical region: Secondary | ICD-10-CM | POA: Diagnosis not present

## 2022-11-10 DIAGNOSIS — N390 Urinary tract infection, site not specified: Secondary | ICD-10-CM | POA: Diagnosis not present

## 2022-11-10 DIAGNOSIS — Z7901 Long term (current) use of anticoagulants: Secondary | ICD-10-CM | POA: Diagnosis not present

## 2022-11-10 DIAGNOSIS — I480 Paroxysmal atrial fibrillation: Secondary | ICD-10-CM | POA: Diagnosis not present

## 2022-11-10 DIAGNOSIS — E7800 Pure hypercholesterolemia, unspecified: Secondary | ICD-10-CM | POA: Diagnosis not present

## 2022-11-10 DIAGNOSIS — Z7982 Long term (current) use of aspirin: Secondary | ICD-10-CM | POA: Diagnosis not present

## 2022-11-10 DIAGNOSIS — N1831 Chronic kidney disease, stage 3a: Secondary | ICD-10-CM | POA: Diagnosis not present

## 2022-11-10 DIAGNOSIS — D631 Anemia in chronic kidney disease: Secondary | ICD-10-CM | POA: Diagnosis not present

## 2022-11-10 DIAGNOSIS — L89322 Pressure ulcer of left buttock, stage 2: Secondary | ICD-10-CM | POA: Diagnosis not present

## 2022-11-10 DIAGNOSIS — Z87891 Personal history of nicotine dependence: Secondary | ICD-10-CM | POA: Diagnosis not present

## 2022-11-10 DIAGNOSIS — G35 Multiple sclerosis: Secondary | ICD-10-CM | POA: Diagnosis not present

## 2022-11-16 DIAGNOSIS — M8580 Other specified disorders of bone density and structure, unspecified site: Secondary | ICD-10-CM | POA: Diagnosis not present

## 2022-11-16 DIAGNOSIS — D631 Anemia in chronic kidney disease: Secondary | ICD-10-CM | POA: Diagnosis not present

## 2022-11-16 DIAGNOSIS — F419 Anxiety disorder, unspecified: Secondary | ICD-10-CM | POA: Diagnosis not present

## 2022-11-16 DIAGNOSIS — B9620 Unspecified Escherichia coli [E. coli] as the cause of diseases classified elsewhere: Secondary | ICD-10-CM | POA: Diagnosis not present

## 2022-11-16 DIAGNOSIS — G9341 Metabolic encephalopathy: Secondary | ICD-10-CM | POA: Diagnosis not present

## 2022-11-16 DIAGNOSIS — N1831 Chronic kidney disease, stage 3a: Secondary | ICD-10-CM | POA: Diagnosis not present

## 2022-11-16 DIAGNOSIS — G35 Multiple sclerosis: Secondary | ICD-10-CM | POA: Diagnosis not present

## 2022-11-16 DIAGNOSIS — L89210 Pressure ulcer of right hip, unstageable: Secondary | ICD-10-CM | POA: Diagnosis not present

## 2022-11-16 DIAGNOSIS — L89322 Pressure ulcer of left buttock, stage 2: Secondary | ICD-10-CM | POA: Diagnosis not present

## 2022-11-16 DIAGNOSIS — I480 Paroxysmal atrial fibrillation: Secondary | ICD-10-CM | POA: Diagnosis not present

## 2022-11-16 DIAGNOSIS — Z7901 Long term (current) use of anticoagulants: Secondary | ICD-10-CM | POA: Diagnosis not present

## 2022-11-16 DIAGNOSIS — L89312 Pressure ulcer of right buttock, stage 2: Secondary | ICD-10-CM | POA: Diagnosis not present

## 2022-11-16 DIAGNOSIS — N390 Urinary tract infection, site not specified: Secondary | ICD-10-CM | POA: Diagnosis not present

## 2022-11-16 DIAGNOSIS — I69351 Hemiplegia and hemiparesis following cerebral infarction affecting right dominant side: Secondary | ICD-10-CM | POA: Diagnosis not present

## 2022-11-16 DIAGNOSIS — E7800 Pure hypercholesterolemia, unspecified: Secondary | ICD-10-CM | POA: Diagnosis not present

## 2022-11-16 DIAGNOSIS — Z955 Presence of coronary angioplasty implant and graft: Secondary | ICD-10-CM | POA: Diagnosis not present

## 2022-11-16 DIAGNOSIS — I129 Hypertensive chronic kidney disease with stage 1 through stage 4 chronic kidney disease, or unspecified chronic kidney disease: Secondary | ICD-10-CM | POA: Diagnosis not present

## 2022-11-16 DIAGNOSIS — I2510 Atherosclerotic heart disease of native coronary artery without angina pectoris: Secondary | ICD-10-CM | POA: Diagnosis not present

## 2022-11-16 DIAGNOSIS — I69391 Dysphagia following cerebral infarction: Secondary | ICD-10-CM | POA: Diagnosis not present

## 2022-11-16 DIAGNOSIS — L89890 Pressure ulcer of other site, unstageable: Secondary | ICD-10-CM | POA: Diagnosis not present

## 2022-11-16 DIAGNOSIS — Z86010 Personal history of colonic polyps: Secondary | ICD-10-CM | POA: Diagnosis not present

## 2022-11-16 DIAGNOSIS — Z87891 Personal history of nicotine dependence: Secondary | ICD-10-CM | POA: Diagnosis not present

## 2022-11-16 DIAGNOSIS — Z7982 Long term (current) use of aspirin: Secondary | ICD-10-CM | POA: Diagnosis not present

## 2022-11-16 DIAGNOSIS — M5030 Other cervical disc degeneration, unspecified cervical region: Secondary | ICD-10-CM | POA: Diagnosis not present

## 2022-11-17 DIAGNOSIS — E785 Hyperlipidemia, unspecified: Secondary | ICD-10-CM | POA: Diagnosis not present

## 2022-11-17 DIAGNOSIS — Z7189 Other specified counseling: Secondary | ICD-10-CM | POA: Diagnosis not present

## 2022-11-17 DIAGNOSIS — F0280 Dementia in other diseases classified elsewhere without behavioral disturbance: Secondary | ICD-10-CM | POA: Diagnosis not present

## 2022-11-17 DIAGNOSIS — I10 Essential (primary) hypertension: Secondary | ICD-10-CM | POA: Diagnosis not present

## 2022-11-17 DIAGNOSIS — G9341 Metabolic encephalopathy: Secondary | ICD-10-CM | POA: Diagnosis not present

## 2022-11-17 DIAGNOSIS — N390 Urinary tract infection, site not specified: Secondary | ICD-10-CM | POA: Diagnosis not present

## 2022-11-17 DIAGNOSIS — I25119 Atherosclerotic heart disease of native coronary artery with unspecified angina pectoris: Secondary | ICD-10-CM | POA: Diagnosis not present

## 2022-11-17 DIAGNOSIS — G35 Multiple sclerosis: Secondary | ICD-10-CM | POA: Diagnosis not present

## 2022-11-17 DIAGNOSIS — I69959 Hemiplegia and hemiparesis following unspecified cerebrovascular disease affecting unspecified side: Secondary | ICD-10-CM | POA: Diagnosis not present

## 2022-11-17 DIAGNOSIS — R531 Weakness: Secondary | ICD-10-CM | POA: Diagnosis not present

## 2022-11-17 DIAGNOSIS — D692 Other nonthrombocytopenic purpura: Secondary | ICD-10-CM | POA: Diagnosis not present

## 2022-11-17 DIAGNOSIS — Z7901 Long term (current) use of anticoagulants: Secondary | ICD-10-CM | POA: Diagnosis not present

## 2022-11-18 DIAGNOSIS — D649 Anemia, unspecified: Secondary | ICD-10-CM | POA: Diagnosis not present

## 2022-11-30 DIAGNOSIS — I69391 Dysphagia following cerebral infarction: Secondary | ICD-10-CM | POA: Diagnosis not present

## 2022-11-30 DIAGNOSIS — N390 Urinary tract infection, site not specified: Secondary | ICD-10-CM | POA: Diagnosis not present

## 2022-11-30 DIAGNOSIS — Z87891 Personal history of nicotine dependence: Secondary | ICD-10-CM | POA: Diagnosis not present

## 2022-11-30 DIAGNOSIS — I69351 Hemiplegia and hemiparesis following cerebral infarction affecting right dominant side: Secondary | ICD-10-CM | POA: Diagnosis not present

## 2022-11-30 DIAGNOSIS — L89890 Pressure ulcer of other site, unstageable: Secondary | ICD-10-CM | POA: Diagnosis not present

## 2022-11-30 DIAGNOSIS — I480 Paroxysmal atrial fibrillation: Secondary | ICD-10-CM | POA: Diagnosis not present

## 2022-11-30 DIAGNOSIS — I129 Hypertensive chronic kidney disease with stage 1 through stage 4 chronic kidney disease, or unspecified chronic kidney disease: Secondary | ICD-10-CM | POA: Diagnosis not present

## 2022-11-30 DIAGNOSIS — F419 Anxiety disorder, unspecified: Secondary | ICD-10-CM | POA: Diagnosis not present

## 2022-11-30 DIAGNOSIS — I2510 Atherosclerotic heart disease of native coronary artery without angina pectoris: Secondary | ICD-10-CM | POA: Diagnosis not present

## 2022-11-30 DIAGNOSIS — G9341 Metabolic encephalopathy: Secondary | ICD-10-CM | POA: Diagnosis not present

## 2022-11-30 DIAGNOSIS — E7800 Pure hypercholesterolemia, unspecified: Secondary | ICD-10-CM | POA: Diagnosis not present

## 2022-11-30 DIAGNOSIS — Z7982 Long term (current) use of aspirin: Secondary | ICD-10-CM | POA: Diagnosis not present

## 2022-11-30 DIAGNOSIS — R269 Unspecified abnormalities of gait and mobility: Secondary | ICD-10-CM | POA: Diagnosis not present

## 2022-11-30 DIAGNOSIS — L89322 Pressure ulcer of left buttock, stage 2: Secondary | ICD-10-CM | POA: Diagnosis not present

## 2022-11-30 DIAGNOSIS — Z955 Presence of coronary angioplasty implant and graft: Secondary | ICD-10-CM | POA: Diagnosis not present

## 2022-11-30 DIAGNOSIS — N1831 Chronic kidney disease, stage 3a: Secondary | ICD-10-CM | POA: Diagnosis not present

## 2022-11-30 DIAGNOSIS — M5030 Other cervical disc degeneration, unspecified cervical region: Secondary | ICD-10-CM | POA: Diagnosis not present

## 2022-11-30 DIAGNOSIS — Z7901 Long term (current) use of anticoagulants: Secondary | ICD-10-CM | POA: Diagnosis not present

## 2022-11-30 DIAGNOSIS — L89312 Pressure ulcer of right buttock, stage 2: Secondary | ICD-10-CM | POA: Diagnosis not present

## 2022-11-30 DIAGNOSIS — M8580 Other specified disorders of bone density and structure, unspecified site: Secondary | ICD-10-CM | POA: Diagnosis not present

## 2022-11-30 DIAGNOSIS — B9620 Unspecified Escherichia coli [E. coli] as the cause of diseases classified elsewhere: Secondary | ICD-10-CM | POA: Diagnosis not present

## 2022-11-30 DIAGNOSIS — Z86010 Personal history of colonic polyps: Secondary | ICD-10-CM | POA: Diagnosis not present

## 2022-11-30 DIAGNOSIS — D631 Anemia in chronic kidney disease: Secondary | ICD-10-CM | POA: Diagnosis not present

## 2022-11-30 DIAGNOSIS — G35 Multiple sclerosis: Secondary | ICD-10-CM | POA: Diagnosis not present

## 2022-11-30 DIAGNOSIS — M6281 Muscle weakness (generalized): Secondary | ICD-10-CM | POA: Diagnosis not present

## 2022-11-30 DIAGNOSIS — L89210 Pressure ulcer of right hip, unstageable: Secondary | ICD-10-CM | POA: Diagnosis not present

## 2022-12-02 DIAGNOSIS — G35 Multiple sclerosis: Secondary | ICD-10-CM | POA: Diagnosis not present

## 2022-12-02 DIAGNOSIS — L8995 Pressure ulcer of unspecified site, unstageable: Secondary | ICD-10-CM | POA: Diagnosis not present

## 2022-12-02 DIAGNOSIS — G9341 Metabolic encephalopathy: Secondary | ICD-10-CM | POA: Diagnosis not present

## 2022-12-02 DIAGNOSIS — Z7401 Bed confinement status: Secondary | ICD-10-CM | POA: Diagnosis not present

## 2022-12-03 DIAGNOSIS — I69351 Hemiplegia and hemiparesis following cerebral infarction affecting right dominant side: Secondary | ICD-10-CM | POA: Diagnosis not present

## 2022-12-03 DIAGNOSIS — E7800 Pure hypercholesterolemia, unspecified: Secondary | ICD-10-CM | POA: Diagnosis not present

## 2022-12-03 DIAGNOSIS — L89210 Pressure ulcer of right hip, unstageable: Secondary | ICD-10-CM | POA: Diagnosis not present

## 2022-12-03 DIAGNOSIS — I480 Paroxysmal atrial fibrillation: Secondary | ICD-10-CM | POA: Diagnosis not present

## 2022-12-03 DIAGNOSIS — N1831 Chronic kidney disease, stage 3a: Secondary | ICD-10-CM | POA: Diagnosis not present

## 2022-12-03 DIAGNOSIS — I69391 Dysphagia following cerebral infarction: Secondary | ICD-10-CM | POA: Diagnosis not present

## 2022-12-03 DIAGNOSIS — I129 Hypertensive chronic kidney disease with stage 1 through stage 4 chronic kidney disease, or unspecified chronic kidney disease: Secondary | ICD-10-CM | POA: Diagnosis not present

## 2022-12-03 DIAGNOSIS — F419 Anxiety disorder, unspecified: Secondary | ICD-10-CM | POA: Diagnosis not present

## 2022-12-03 DIAGNOSIS — Z86010 Personal history of colonic polyps: Secondary | ICD-10-CM | POA: Diagnosis not present

## 2022-12-03 DIAGNOSIS — B9620 Unspecified Escherichia coli [E. coli] as the cause of diseases classified elsewhere: Secondary | ICD-10-CM | POA: Diagnosis not present

## 2022-12-03 DIAGNOSIS — Z955 Presence of coronary angioplasty implant and graft: Secondary | ICD-10-CM | POA: Diagnosis not present

## 2022-12-03 DIAGNOSIS — N390 Urinary tract infection, site not specified: Secondary | ICD-10-CM | POA: Diagnosis not present

## 2022-12-03 DIAGNOSIS — M5030 Other cervical disc degeneration, unspecified cervical region: Secondary | ICD-10-CM | POA: Diagnosis not present

## 2022-12-03 DIAGNOSIS — D631 Anemia in chronic kidney disease: Secondary | ICD-10-CM | POA: Diagnosis not present

## 2022-12-03 DIAGNOSIS — M8580 Other specified disorders of bone density and structure, unspecified site: Secondary | ICD-10-CM | POA: Diagnosis not present

## 2022-12-03 DIAGNOSIS — L89890 Pressure ulcer of other site, unstageable: Secondary | ICD-10-CM | POA: Diagnosis not present

## 2022-12-03 DIAGNOSIS — G9341 Metabolic encephalopathy: Secondary | ICD-10-CM | POA: Diagnosis not present

## 2022-12-03 DIAGNOSIS — Z7982 Long term (current) use of aspirin: Secondary | ICD-10-CM | POA: Diagnosis not present

## 2022-12-03 DIAGNOSIS — I2510 Atherosclerotic heart disease of native coronary artery without angina pectoris: Secondary | ICD-10-CM | POA: Diagnosis not present

## 2022-12-03 DIAGNOSIS — Z7901 Long term (current) use of anticoagulants: Secondary | ICD-10-CM | POA: Diagnosis not present

## 2022-12-03 DIAGNOSIS — Z87891 Personal history of nicotine dependence: Secondary | ICD-10-CM | POA: Diagnosis not present

## 2022-12-03 DIAGNOSIS — G35 Multiple sclerosis: Secondary | ICD-10-CM | POA: Diagnosis not present

## 2022-12-03 DIAGNOSIS — L89312 Pressure ulcer of right buttock, stage 2: Secondary | ICD-10-CM | POA: Diagnosis not present

## 2022-12-03 DIAGNOSIS — L89322 Pressure ulcer of left buttock, stage 2: Secondary | ICD-10-CM | POA: Diagnosis not present

## 2022-12-06 DIAGNOSIS — I69351 Hemiplegia and hemiparesis following cerebral infarction affecting right dominant side: Secondary | ICD-10-CM | POA: Diagnosis not present

## 2022-12-06 DIAGNOSIS — D631 Anemia in chronic kidney disease: Secondary | ICD-10-CM | POA: Diagnosis not present

## 2022-12-06 DIAGNOSIS — I69391 Dysphagia following cerebral infarction: Secondary | ICD-10-CM | POA: Diagnosis not present

## 2022-12-06 DIAGNOSIS — N1831 Chronic kidney disease, stage 3a: Secondary | ICD-10-CM | POA: Diagnosis not present

## 2022-12-06 DIAGNOSIS — I2510 Atherosclerotic heart disease of native coronary artery without angina pectoris: Secondary | ICD-10-CM | POA: Diagnosis not present

## 2022-12-06 DIAGNOSIS — Z955 Presence of coronary angioplasty implant and graft: Secondary | ICD-10-CM | POA: Diagnosis not present

## 2022-12-06 DIAGNOSIS — Z7982 Long term (current) use of aspirin: Secondary | ICD-10-CM | POA: Diagnosis not present

## 2022-12-06 DIAGNOSIS — I129 Hypertensive chronic kidney disease with stage 1 through stage 4 chronic kidney disease, or unspecified chronic kidney disease: Secondary | ICD-10-CM | POA: Diagnosis not present

## 2022-12-06 DIAGNOSIS — G9341 Metabolic encephalopathy: Secondary | ICD-10-CM | POA: Diagnosis not present

## 2022-12-06 DIAGNOSIS — E7800 Pure hypercholesterolemia, unspecified: Secondary | ICD-10-CM | POA: Diagnosis not present

## 2022-12-06 DIAGNOSIS — L89312 Pressure ulcer of right buttock, stage 2: Secondary | ICD-10-CM | POA: Diagnosis not present

## 2022-12-06 DIAGNOSIS — I480 Paroxysmal atrial fibrillation: Secondary | ICD-10-CM | POA: Diagnosis not present

## 2022-12-06 DIAGNOSIS — L89210 Pressure ulcer of right hip, unstageable: Secondary | ICD-10-CM | POA: Diagnosis not present

## 2022-12-06 DIAGNOSIS — L89322 Pressure ulcer of left buttock, stage 2: Secondary | ICD-10-CM | POA: Diagnosis not present

## 2022-12-06 DIAGNOSIS — L89890 Pressure ulcer of other site, unstageable: Secondary | ICD-10-CM | POA: Diagnosis not present

## 2022-12-06 DIAGNOSIS — G35 Multiple sclerosis: Secondary | ICD-10-CM | POA: Diagnosis not present

## 2022-12-06 DIAGNOSIS — F419 Anxiety disorder, unspecified: Secondary | ICD-10-CM | POA: Diagnosis not present

## 2022-12-06 DIAGNOSIS — M5030 Other cervical disc degeneration, unspecified cervical region: Secondary | ICD-10-CM | POA: Diagnosis not present

## 2022-12-06 DIAGNOSIS — Z7901 Long term (current) use of anticoagulants: Secondary | ICD-10-CM | POA: Diagnosis not present

## 2022-12-06 DIAGNOSIS — B9620 Unspecified Escherichia coli [E. coli] as the cause of diseases classified elsewhere: Secondary | ICD-10-CM | POA: Diagnosis not present

## 2022-12-06 DIAGNOSIS — Z87891 Personal history of nicotine dependence: Secondary | ICD-10-CM | POA: Diagnosis not present

## 2022-12-06 DIAGNOSIS — Z86010 Personal history of colonic polyps: Secondary | ICD-10-CM | POA: Diagnosis not present

## 2022-12-06 DIAGNOSIS — M8580 Other specified disorders of bone density and structure, unspecified site: Secondary | ICD-10-CM | POA: Diagnosis not present

## 2022-12-06 DIAGNOSIS — N390 Urinary tract infection, site not specified: Secondary | ICD-10-CM | POA: Diagnosis not present

## 2022-12-08 DIAGNOSIS — L89890 Pressure ulcer of other site, unstageable: Secondary | ICD-10-CM | POA: Diagnosis not present

## 2022-12-08 DIAGNOSIS — L89322 Pressure ulcer of left buttock, stage 2: Secondary | ICD-10-CM | POA: Diagnosis not present

## 2022-12-08 DIAGNOSIS — Z86010 Personal history of colonic polyps: Secondary | ICD-10-CM | POA: Diagnosis not present

## 2022-12-08 DIAGNOSIS — Z7901 Long term (current) use of anticoagulants: Secondary | ICD-10-CM | POA: Diagnosis not present

## 2022-12-08 DIAGNOSIS — N390 Urinary tract infection, site not specified: Secondary | ICD-10-CM | POA: Diagnosis not present

## 2022-12-08 DIAGNOSIS — D631 Anemia in chronic kidney disease: Secondary | ICD-10-CM | POA: Diagnosis not present

## 2022-12-08 DIAGNOSIS — L89312 Pressure ulcer of right buttock, stage 2: Secondary | ICD-10-CM | POA: Diagnosis not present

## 2022-12-08 DIAGNOSIS — M5030 Other cervical disc degeneration, unspecified cervical region: Secondary | ICD-10-CM | POA: Diagnosis not present

## 2022-12-08 DIAGNOSIS — I69391 Dysphagia following cerebral infarction: Secondary | ICD-10-CM | POA: Diagnosis not present

## 2022-12-08 DIAGNOSIS — G9341 Metabolic encephalopathy: Secondary | ICD-10-CM | POA: Diagnosis not present

## 2022-12-08 DIAGNOSIS — Z7982 Long term (current) use of aspirin: Secondary | ICD-10-CM | POA: Diagnosis not present

## 2022-12-08 DIAGNOSIS — I69351 Hemiplegia and hemiparesis following cerebral infarction affecting right dominant side: Secondary | ICD-10-CM | POA: Diagnosis not present

## 2022-12-08 DIAGNOSIS — F419 Anxiety disorder, unspecified: Secondary | ICD-10-CM | POA: Diagnosis not present

## 2022-12-08 DIAGNOSIS — I129 Hypertensive chronic kidney disease with stage 1 through stage 4 chronic kidney disease, or unspecified chronic kidney disease: Secondary | ICD-10-CM | POA: Diagnosis not present

## 2022-12-08 DIAGNOSIS — L89210 Pressure ulcer of right hip, unstageable: Secondary | ICD-10-CM | POA: Diagnosis not present

## 2022-12-08 DIAGNOSIS — E7800 Pure hypercholesterolemia, unspecified: Secondary | ICD-10-CM | POA: Diagnosis not present

## 2022-12-08 DIAGNOSIS — G35 Multiple sclerosis: Secondary | ICD-10-CM | POA: Diagnosis not present

## 2022-12-08 DIAGNOSIS — N1831 Chronic kidney disease, stage 3a: Secondary | ICD-10-CM | POA: Diagnosis not present

## 2022-12-08 DIAGNOSIS — I2510 Atherosclerotic heart disease of native coronary artery without angina pectoris: Secondary | ICD-10-CM | POA: Diagnosis not present

## 2022-12-08 DIAGNOSIS — Z955 Presence of coronary angioplasty implant and graft: Secondary | ICD-10-CM | POA: Diagnosis not present

## 2022-12-08 DIAGNOSIS — I480 Paroxysmal atrial fibrillation: Secondary | ICD-10-CM | POA: Diagnosis not present

## 2022-12-08 DIAGNOSIS — Z87891 Personal history of nicotine dependence: Secondary | ICD-10-CM | POA: Diagnosis not present

## 2022-12-08 DIAGNOSIS — M8580 Other specified disorders of bone density and structure, unspecified site: Secondary | ICD-10-CM | POA: Diagnosis not present

## 2022-12-08 DIAGNOSIS — B9620 Unspecified Escherichia coli [E. coli] as the cause of diseases classified elsewhere: Secondary | ICD-10-CM | POA: Diagnosis not present

## 2022-12-10 DIAGNOSIS — L89210 Pressure ulcer of right hip, unstageable: Secondary | ICD-10-CM | POA: Diagnosis not present

## 2022-12-10 DIAGNOSIS — Z7982 Long term (current) use of aspirin: Secondary | ICD-10-CM | POA: Diagnosis not present

## 2022-12-10 DIAGNOSIS — Z955 Presence of coronary angioplasty implant and graft: Secondary | ICD-10-CM | POA: Diagnosis not present

## 2022-12-10 DIAGNOSIS — N390 Urinary tract infection, site not specified: Secondary | ICD-10-CM | POA: Diagnosis not present

## 2022-12-10 DIAGNOSIS — Z87891 Personal history of nicotine dependence: Secondary | ICD-10-CM | POA: Diagnosis not present

## 2022-12-10 DIAGNOSIS — M8580 Other specified disorders of bone density and structure, unspecified site: Secondary | ICD-10-CM | POA: Diagnosis not present

## 2022-12-10 DIAGNOSIS — N1831 Chronic kidney disease, stage 3a: Secondary | ICD-10-CM | POA: Diagnosis not present

## 2022-12-10 DIAGNOSIS — I480 Paroxysmal atrial fibrillation: Secondary | ICD-10-CM | POA: Diagnosis not present

## 2022-12-10 DIAGNOSIS — G9341 Metabolic encephalopathy: Secondary | ICD-10-CM | POA: Diagnosis not present

## 2022-12-10 DIAGNOSIS — I2510 Atherosclerotic heart disease of native coronary artery without angina pectoris: Secondary | ICD-10-CM | POA: Diagnosis not present

## 2022-12-10 DIAGNOSIS — I69391 Dysphagia following cerebral infarction: Secondary | ICD-10-CM | POA: Diagnosis not present

## 2022-12-10 DIAGNOSIS — M5030 Other cervical disc degeneration, unspecified cervical region: Secondary | ICD-10-CM | POA: Diagnosis not present

## 2022-12-10 DIAGNOSIS — F419 Anxiety disorder, unspecified: Secondary | ICD-10-CM | POA: Diagnosis not present

## 2022-12-10 DIAGNOSIS — L89322 Pressure ulcer of left buttock, stage 2: Secondary | ICD-10-CM | POA: Diagnosis not present

## 2022-12-10 DIAGNOSIS — L89890 Pressure ulcer of other site, unstageable: Secondary | ICD-10-CM | POA: Diagnosis not present

## 2022-12-10 DIAGNOSIS — I129 Hypertensive chronic kidney disease with stage 1 through stage 4 chronic kidney disease, or unspecified chronic kidney disease: Secondary | ICD-10-CM | POA: Diagnosis not present

## 2022-12-10 DIAGNOSIS — E7800 Pure hypercholesterolemia, unspecified: Secondary | ICD-10-CM | POA: Diagnosis not present

## 2022-12-10 DIAGNOSIS — L89312 Pressure ulcer of right buttock, stage 2: Secondary | ICD-10-CM | POA: Diagnosis not present

## 2022-12-10 DIAGNOSIS — Z7901 Long term (current) use of anticoagulants: Secondary | ICD-10-CM | POA: Diagnosis not present

## 2022-12-10 DIAGNOSIS — D631 Anemia in chronic kidney disease: Secondary | ICD-10-CM | POA: Diagnosis not present

## 2022-12-10 DIAGNOSIS — G35 Multiple sclerosis: Secondary | ICD-10-CM | POA: Diagnosis not present

## 2022-12-10 DIAGNOSIS — I69351 Hemiplegia and hemiparesis following cerebral infarction affecting right dominant side: Secondary | ICD-10-CM | POA: Diagnosis not present

## 2022-12-10 DIAGNOSIS — Z86010 Personal history of colonic polyps: Secondary | ICD-10-CM | POA: Diagnosis not present

## 2022-12-10 DIAGNOSIS — B9620 Unspecified Escherichia coli [E. coli] as the cause of diseases classified elsewhere: Secondary | ICD-10-CM | POA: Diagnosis not present

## 2022-12-13 DIAGNOSIS — I69391 Dysphagia following cerebral infarction: Secondary | ICD-10-CM | POA: Diagnosis not present

## 2022-12-13 DIAGNOSIS — B9620 Unspecified Escherichia coli [E. coli] as the cause of diseases classified elsewhere: Secondary | ICD-10-CM | POA: Diagnosis not present

## 2022-12-13 DIAGNOSIS — G9341 Metabolic encephalopathy: Secondary | ICD-10-CM | POA: Diagnosis not present

## 2022-12-13 DIAGNOSIS — Z955 Presence of coronary angioplasty implant and graft: Secondary | ICD-10-CM | POA: Diagnosis not present

## 2022-12-13 DIAGNOSIS — L89890 Pressure ulcer of other site, unstageable: Secondary | ICD-10-CM | POA: Diagnosis not present

## 2022-12-13 DIAGNOSIS — M8580 Other specified disorders of bone density and structure, unspecified site: Secondary | ICD-10-CM | POA: Diagnosis not present

## 2022-12-13 DIAGNOSIS — F419 Anxiety disorder, unspecified: Secondary | ICD-10-CM | POA: Diagnosis not present

## 2022-12-13 DIAGNOSIS — I129 Hypertensive chronic kidney disease with stage 1 through stage 4 chronic kidney disease, or unspecified chronic kidney disease: Secondary | ICD-10-CM | POA: Diagnosis not present

## 2022-12-13 DIAGNOSIS — E7800 Pure hypercholesterolemia, unspecified: Secondary | ICD-10-CM | POA: Diagnosis not present

## 2022-12-13 DIAGNOSIS — Z7982 Long term (current) use of aspirin: Secondary | ICD-10-CM | POA: Diagnosis not present

## 2022-12-13 DIAGNOSIS — Z8673 Personal history of transient ischemic attack (TIA), and cerebral infarction without residual deficits: Secondary | ICD-10-CM | POA: Diagnosis not present

## 2022-12-13 DIAGNOSIS — L89210 Pressure ulcer of right hip, unstageable: Secondary | ICD-10-CM | POA: Diagnosis not present

## 2022-12-13 DIAGNOSIS — Z87891 Personal history of nicotine dependence: Secondary | ICD-10-CM | POA: Diagnosis not present

## 2022-12-13 DIAGNOSIS — L89322 Pressure ulcer of left buttock, stage 2: Secondary | ICD-10-CM | POA: Diagnosis not present

## 2022-12-13 DIAGNOSIS — N1831 Chronic kidney disease, stage 3a: Secondary | ICD-10-CM | POA: Diagnosis not present

## 2022-12-13 DIAGNOSIS — N390 Urinary tract infection, site not specified: Secondary | ICD-10-CM | POA: Diagnosis not present

## 2022-12-13 DIAGNOSIS — Z86010 Personal history of colonic polyps: Secondary | ICD-10-CM | POA: Diagnosis not present

## 2022-12-13 DIAGNOSIS — I2510 Atherosclerotic heart disease of native coronary artery without angina pectoris: Secondary | ICD-10-CM | POA: Diagnosis not present

## 2022-12-13 DIAGNOSIS — I69351 Hemiplegia and hemiparesis following cerebral infarction affecting right dominant side: Secondary | ICD-10-CM | POA: Diagnosis not present

## 2022-12-13 DIAGNOSIS — G35 Multiple sclerosis: Secondary | ICD-10-CM | POA: Diagnosis not present

## 2022-12-13 DIAGNOSIS — I10 Essential (primary) hypertension: Secondary | ICD-10-CM | POA: Diagnosis not present

## 2022-12-13 DIAGNOSIS — D631 Anemia in chronic kidney disease: Secondary | ICD-10-CM | POA: Diagnosis not present

## 2022-12-13 DIAGNOSIS — Z7901 Long term (current) use of anticoagulants: Secondary | ICD-10-CM | POA: Diagnosis not present

## 2022-12-13 DIAGNOSIS — M5030 Other cervical disc degeneration, unspecified cervical region: Secondary | ICD-10-CM | POA: Diagnosis not present

## 2022-12-13 DIAGNOSIS — L89312 Pressure ulcer of right buttock, stage 2: Secondary | ICD-10-CM | POA: Diagnosis not present

## 2022-12-13 DIAGNOSIS — I480 Paroxysmal atrial fibrillation: Secondary | ICD-10-CM | POA: Diagnosis not present

## 2022-12-15 DIAGNOSIS — Z955 Presence of coronary angioplasty implant and graft: Secondary | ICD-10-CM | POA: Diagnosis not present

## 2022-12-15 DIAGNOSIS — Z87891 Personal history of nicotine dependence: Secondary | ICD-10-CM | POA: Diagnosis not present

## 2022-12-15 DIAGNOSIS — I69351 Hemiplegia and hemiparesis following cerebral infarction affecting right dominant side: Secondary | ICD-10-CM | POA: Diagnosis not present

## 2022-12-15 DIAGNOSIS — E7800 Pure hypercholesterolemia, unspecified: Secondary | ICD-10-CM | POA: Diagnosis not present

## 2022-12-15 DIAGNOSIS — Z7901 Long term (current) use of anticoagulants: Secondary | ICD-10-CM | POA: Diagnosis not present

## 2022-12-15 DIAGNOSIS — M8580 Other specified disorders of bone density and structure, unspecified site: Secondary | ICD-10-CM | POA: Diagnosis not present

## 2022-12-15 DIAGNOSIS — L89322 Pressure ulcer of left buttock, stage 2: Secondary | ICD-10-CM | POA: Diagnosis not present

## 2022-12-15 DIAGNOSIS — L89890 Pressure ulcer of other site, unstageable: Secondary | ICD-10-CM | POA: Diagnosis not present

## 2022-12-15 DIAGNOSIS — I2510 Atherosclerotic heart disease of native coronary artery without angina pectoris: Secondary | ICD-10-CM | POA: Diagnosis not present

## 2022-12-15 DIAGNOSIS — B9620 Unspecified Escherichia coli [E. coli] as the cause of diseases classified elsewhere: Secondary | ICD-10-CM | POA: Diagnosis not present

## 2022-12-15 DIAGNOSIS — F419 Anxiety disorder, unspecified: Secondary | ICD-10-CM | POA: Diagnosis not present

## 2022-12-15 DIAGNOSIS — N390 Urinary tract infection, site not specified: Secondary | ICD-10-CM | POA: Diagnosis not present

## 2022-12-15 DIAGNOSIS — L89312 Pressure ulcer of right buttock, stage 2: Secondary | ICD-10-CM | POA: Diagnosis not present

## 2022-12-15 DIAGNOSIS — N1831 Chronic kidney disease, stage 3a: Secondary | ICD-10-CM | POA: Diagnosis not present

## 2022-12-15 DIAGNOSIS — Z86010 Personal history of colonic polyps: Secondary | ICD-10-CM | POA: Diagnosis not present

## 2022-12-15 DIAGNOSIS — L89210 Pressure ulcer of right hip, unstageable: Secondary | ICD-10-CM | POA: Diagnosis not present

## 2022-12-15 DIAGNOSIS — I480 Paroxysmal atrial fibrillation: Secondary | ICD-10-CM | POA: Diagnosis not present

## 2022-12-15 DIAGNOSIS — I69391 Dysphagia following cerebral infarction: Secondary | ICD-10-CM | POA: Diagnosis not present

## 2022-12-15 DIAGNOSIS — Z7982 Long term (current) use of aspirin: Secondary | ICD-10-CM | POA: Diagnosis not present

## 2022-12-15 DIAGNOSIS — I129 Hypertensive chronic kidney disease with stage 1 through stage 4 chronic kidney disease, or unspecified chronic kidney disease: Secondary | ICD-10-CM | POA: Diagnosis not present

## 2022-12-15 DIAGNOSIS — G35 Multiple sclerosis: Secondary | ICD-10-CM | POA: Diagnosis not present

## 2022-12-15 DIAGNOSIS — D631 Anemia in chronic kidney disease: Secondary | ICD-10-CM | POA: Diagnosis not present

## 2022-12-15 DIAGNOSIS — G9341 Metabolic encephalopathy: Secondary | ICD-10-CM | POA: Diagnosis not present

## 2022-12-15 DIAGNOSIS — M5030 Other cervical disc degeneration, unspecified cervical region: Secondary | ICD-10-CM | POA: Diagnosis not present

## 2022-12-17 DIAGNOSIS — M8580 Other specified disorders of bone density and structure, unspecified site: Secondary | ICD-10-CM | POA: Diagnosis not present

## 2022-12-17 DIAGNOSIS — Z7901 Long term (current) use of anticoagulants: Secondary | ICD-10-CM | POA: Diagnosis not present

## 2022-12-17 DIAGNOSIS — I69391 Dysphagia following cerebral infarction: Secondary | ICD-10-CM | POA: Diagnosis not present

## 2022-12-17 DIAGNOSIS — L89322 Pressure ulcer of left buttock, stage 2: Secondary | ICD-10-CM | POA: Diagnosis not present

## 2022-12-17 DIAGNOSIS — N390 Urinary tract infection, site not specified: Secondary | ICD-10-CM | POA: Diagnosis not present

## 2022-12-17 DIAGNOSIS — L89312 Pressure ulcer of right buttock, stage 2: Secondary | ICD-10-CM | POA: Diagnosis not present

## 2022-12-17 DIAGNOSIS — Z86010 Personal history of colonic polyps: Secondary | ICD-10-CM | POA: Diagnosis not present

## 2022-12-17 DIAGNOSIS — B9620 Unspecified Escherichia coli [E. coli] as the cause of diseases classified elsewhere: Secondary | ICD-10-CM | POA: Diagnosis not present

## 2022-12-17 DIAGNOSIS — I69351 Hemiplegia and hemiparesis following cerebral infarction affecting right dominant side: Secondary | ICD-10-CM | POA: Diagnosis not present

## 2022-12-17 DIAGNOSIS — E7800 Pure hypercholesterolemia, unspecified: Secondary | ICD-10-CM | POA: Diagnosis not present

## 2022-12-17 DIAGNOSIS — G35 Multiple sclerosis: Secondary | ICD-10-CM | POA: Diagnosis not present

## 2022-12-17 DIAGNOSIS — N1831 Chronic kidney disease, stage 3a: Secondary | ICD-10-CM | POA: Diagnosis not present

## 2022-12-17 DIAGNOSIS — L89890 Pressure ulcer of other site, unstageable: Secondary | ICD-10-CM | POA: Diagnosis not present

## 2022-12-17 DIAGNOSIS — G9341 Metabolic encephalopathy: Secondary | ICD-10-CM | POA: Diagnosis not present

## 2022-12-17 DIAGNOSIS — I480 Paroxysmal atrial fibrillation: Secondary | ICD-10-CM | POA: Diagnosis not present

## 2022-12-17 DIAGNOSIS — I2510 Atherosclerotic heart disease of native coronary artery without angina pectoris: Secondary | ICD-10-CM | POA: Diagnosis not present

## 2022-12-17 DIAGNOSIS — F419 Anxiety disorder, unspecified: Secondary | ICD-10-CM | POA: Diagnosis not present

## 2022-12-17 DIAGNOSIS — Z955 Presence of coronary angioplasty implant and graft: Secondary | ICD-10-CM | POA: Diagnosis not present

## 2022-12-17 DIAGNOSIS — Z87891 Personal history of nicotine dependence: Secondary | ICD-10-CM | POA: Diagnosis not present

## 2022-12-17 DIAGNOSIS — I129 Hypertensive chronic kidney disease with stage 1 through stage 4 chronic kidney disease, or unspecified chronic kidney disease: Secondary | ICD-10-CM | POA: Diagnosis not present

## 2022-12-17 DIAGNOSIS — L89210 Pressure ulcer of right hip, unstageable: Secondary | ICD-10-CM | POA: Diagnosis not present

## 2022-12-17 DIAGNOSIS — M5030 Other cervical disc degeneration, unspecified cervical region: Secondary | ICD-10-CM | POA: Diagnosis not present

## 2022-12-17 DIAGNOSIS — Z7982 Long term (current) use of aspirin: Secondary | ICD-10-CM | POA: Diagnosis not present

## 2022-12-17 DIAGNOSIS — D631 Anemia in chronic kidney disease: Secondary | ICD-10-CM | POA: Diagnosis not present

## 2022-12-20 DIAGNOSIS — N390 Urinary tract infection, site not specified: Secondary | ICD-10-CM | POA: Diagnosis not present

## 2022-12-20 DIAGNOSIS — D631 Anemia in chronic kidney disease: Secondary | ICD-10-CM | POA: Diagnosis not present

## 2022-12-20 DIAGNOSIS — Z87891 Personal history of nicotine dependence: Secondary | ICD-10-CM | POA: Diagnosis not present

## 2022-12-20 DIAGNOSIS — F419 Anxiety disorder, unspecified: Secondary | ICD-10-CM | POA: Diagnosis not present

## 2022-12-20 DIAGNOSIS — Z7901 Long term (current) use of anticoagulants: Secondary | ICD-10-CM | POA: Diagnosis not present

## 2022-12-20 DIAGNOSIS — I2510 Atherosclerotic heart disease of native coronary artery without angina pectoris: Secondary | ICD-10-CM | POA: Diagnosis not present

## 2022-12-20 DIAGNOSIS — L89210 Pressure ulcer of right hip, unstageable: Secondary | ICD-10-CM | POA: Diagnosis not present

## 2022-12-20 DIAGNOSIS — M5030 Other cervical disc degeneration, unspecified cervical region: Secondary | ICD-10-CM | POA: Diagnosis not present

## 2022-12-20 DIAGNOSIS — L89312 Pressure ulcer of right buttock, stage 2: Secondary | ICD-10-CM | POA: Diagnosis not present

## 2022-12-20 DIAGNOSIS — Z86010 Personal history of colonic polyps: Secondary | ICD-10-CM | POA: Diagnosis not present

## 2022-12-20 DIAGNOSIS — I69391 Dysphagia following cerebral infarction: Secondary | ICD-10-CM | POA: Diagnosis not present

## 2022-12-20 DIAGNOSIS — I480 Paroxysmal atrial fibrillation: Secondary | ICD-10-CM | POA: Diagnosis not present

## 2022-12-20 DIAGNOSIS — Z7982 Long term (current) use of aspirin: Secondary | ICD-10-CM | POA: Diagnosis not present

## 2022-12-20 DIAGNOSIS — G9341 Metabolic encephalopathy: Secondary | ICD-10-CM | POA: Diagnosis not present

## 2022-12-20 DIAGNOSIS — G35 Multiple sclerosis: Secondary | ICD-10-CM | POA: Diagnosis not present

## 2022-12-20 DIAGNOSIS — I69351 Hemiplegia and hemiparesis following cerebral infarction affecting right dominant side: Secondary | ICD-10-CM | POA: Diagnosis not present

## 2022-12-20 DIAGNOSIS — L89322 Pressure ulcer of left buttock, stage 2: Secondary | ICD-10-CM | POA: Diagnosis not present

## 2022-12-20 DIAGNOSIS — I129 Hypertensive chronic kidney disease with stage 1 through stage 4 chronic kidney disease, or unspecified chronic kidney disease: Secondary | ICD-10-CM | POA: Diagnosis not present

## 2022-12-20 DIAGNOSIS — B9620 Unspecified Escherichia coli [E. coli] as the cause of diseases classified elsewhere: Secondary | ICD-10-CM | POA: Diagnosis not present

## 2022-12-20 DIAGNOSIS — L89890 Pressure ulcer of other site, unstageable: Secondary | ICD-10-CM | POA: Diagnosis not present

## 2022-12-20 DIAGNOSIS — N1831 Chronic kidney disease, stage 3a: Secondary | ICD-10-CM | POA: Diagnosis not present

## 2022-12-20 DIAGNOSIS — Z955 Presence of coronary angioplasty implant and graft: Secondary | ICD-10-CM | POA: Diagnosis not present

## 2022-12-20 DIAGNOSIS — E7800 Pure hypercholesterolemia, unspecified: Secondary | ICD-10-CM | POA: Diagnosis not present

## 2022-12-20 DIAGNOSIS — M8580 Other specified disorders of bone density and structure, unspecified site: Secondary | ICD-10-CM | POA: Diagnosis not present

## 2022-12-22 DIAGNOSIS — L89312 Pressure ulcer of right buttock, stage 2: Secondary | ICD-10-CM | POA: Diagnosis not present

## 2022-12-22 DIAGNOSIS — D631 Anemia in chronic kidney disease: Secondary | ICD-10-CM | POA: Diagnosis not present

## 2022-12-22 DIAGNOSIS — Z955 Presence of coronary angioplasty implant and graft: Secondary | ICD-10-CM | POA: Diagnosis not present

## 2022-12-22 DIAGNOSIS — N1831 Chronic kidney disease, stage 3a: Secondary | ICD-10-CM | POA: Diagnosis not present

## 2022-12-22 DIAGNOSIS — Z86010 Personal history of colonic polyps: Secondary | ICD-10-CM | POA: Diagnosis not present

## 2022-12-22 DIAGNOSIS — Z7901 Long term (current) use of anticoagulants: Secondary | ICD-10-CM | POA: Diagnosis not present

## 2022-12-22 DIAGNOSIS — B9620 Unspecified Escherichia coli [E. coli] as the cause of diseases classified elsewhere: Secondary | ICD-10-CM | POA: Diagnosis not present

## 2022-12-22 DIAGNOSIS — F419 Anxiety disorder, unspecified: Secondary | ICD-10-CM | POA: Diagnosis not present

## 2022-12-22 DIAGNOSIS — M8580 Other specified disorders of bone density and structure, unspecified site: Secondary | ICD-10-CM | POA: Diagnosis not present

## 2022-12-22 DIAGNOSIS — G9341 Metabolic encephalopathy: Secondary | ICD-10-CM | POA: Diagnosis not present

## 2022-12-22 DIAGNOSIS — I2510 Atherosclerotic heart disease of native coronary artery without angina pectoris: Secondary | ICD-10-CM | POA: Diagnosis not present

## 2022-12-22 DIAGNOSIS — E7800 Pure hypercholesterolemia, unspecified: Secondary | ICD-10-CM | POA: Diagnosis not present

## 2022-12-22 DIAGNOSIS — G35 Multiple sclerosis: Secondary | ICD-10-CM | POA: Diagnosis not present

## 2022-12-22 DIAGNOSIS — Z7982 Long term (current) use of aspirin: Secondary | ICD-10-CM | POA: Diagnosis not present

## 2022-12-22 DIAGNOSIS — N390 Urinary tract infection, site not specified: Secondary | ICD-10-CM | POA: Diagnosis not present

## 2022-12-22 DIAGNOSIS — I480 Paroxysmal atrial fibrillation: Secondary | ICD-10-CM | POA: Diagnosis not present

## 2022-12-22 DIAGNOSIS — I69391 Dysphagia following cerebral infarction: Secondary | ICD-10-CM | POA: Diagnosis not present

## 2022-12-22 DIAGNOSIS — M5030 Other cervical disc degeneration, unspecified cervical region: Secondary | ICD-10-CM | POA: Diagnosis not present

## 2022-12-22 DIAGNOSIS — I69351 Hemiplegia and hemiparesis following cerebral infarction affecting right dominant side: Secondary | ICD-10-CM | POA: Diagnosis not present

## 2022-12-22 DIAGNOSIS — Z87891 Personal history of nicotine dependence: Secondary | ICD-10-CM | POA: Diagnosis not present

## 2022-12-22 DIAGNOSIS — L89890 Pressure ulcer of other site, unstageable: Secondary | ICD-10-CM | POA: Diagnosis not present

## 2022-12-22 DIAGNOSIS — L89322 Pressure ulcer of left buttock, stage 2: Secondary | ICD-10-CM | POA: Diagnosis not present

## 2022-12-22 DIAGNOSIS — I129 Hypertensive chronic kidney disease with stage 1 through stage 4 chronic kidney disease, or unspecified chronic kidney disease: Secondary | ICD-10-CM | POA: Diagnosis not present

## 2022-12-22 DIAGNOSIS — L89210 Pressure ulcer of right hip, unstageable: Secondary | ICD-10-CM | POA: Diagnosis not present

## 2022-12-24 DIAGNOSIS — M8580 Other specified disorders of bone density and structure, unspecified site: Secondary | ICD-10-CM | POA: Diagnosis not present

## 2022-12-24 DIAGNOSIS — L89312 Pressure ulcer of right buttock, stage 2: Secondary | ICD-10-CM | POA: Diagnosis not present

## 2022-12-24 DIAGNOSIS — M5030 Other cervical disc degeneration, unspecified cervical region: Secondary | ICD-10-CM | POA: Diagnosis not present

## 2022-12-24 DIAGNOSIS — G9341 Metabolic encephalopathy: Secondary | ICD-10-CM | POA: Diagnosis not present

## 2022-12-24 DIAGNOSIS — I2510 Atherosclerotic heart disease of native coronary artery without angina pectoris: Secondary | ICD-10-CM | POA: Diagnosis not present

## 2022-12-24 DIAGNOSIS — D631 Anemia in chronic kidney disease: Secondary | ICD-10-CM | POA: Diagnosis not present

## 2022-12-24 DIAGNOSIS — Z87891 Personal history of nicotine dependence: Secondary | ICD-10-CM | POA: Diagnosis not present

## 2022-12-24 DIAGNOSIS — I69351 Hemiplegia and hemiparesis following cerebral infarction affecting right dominant side: Secondary | ICD-10-CM | POA: Diagnosis not present

## 2022-12-24 DIAGNOSIS — B9620 Unspecified Escherichia coli [E. coli] as the cause of diseases classified elsewhere: Secondary | ICD-10-CM | POA: Diagnosis not present

## 2022-12-24 DIAGNOSIS — I480 Paroxysmal atrial fibrillation: Secondary | ICD-10-CM | POA: Diagnosis not present

## 2022-12-24 DIAGNOSIS — Z86010 Personal history of colonic polyps: Secondary | ICD-10-CM | POA: Diagnosis not present

## 2022-12-24 DIAGNOSIS — Z955 Presence of coronary angioplasty implant and graft: Secondary | ICD-10-CM | POA: Diagnosis not present

## 2022-12-24 DIAGNOSIS — N390 Urinary tract infection, site not specified: Secondary | ICD-10-CM | POA: Diagnosis not present

## 2022-12-24 DIAGNOSIS — G35 Multiple sclerosis: Secondary | ICD-10-CM | POA: Diagnosis not present

## 2022-12-24 DIAGNOSIS — Z7901 Long term (current) use of anticoagulants: Secondary | ICD-10-CM | POA: Diagnosis not present

## 2022-12-24 DIAGNOSIS — L89322 Pressure ulcer of left buttock, stage 2: Secondary | ICD-10-CM | POA: Diagnosis not present

## 2022-12-24 DIAGNOSIS — F419 Anxiety disorder, unspecified: Secondary | ICD-10-CM | POA: Diagnosis not present

## 2022-12-24 DIAGNOSIS — I129 Hypertensive chronic kidney disease with stage 1 through stage 4 chronic kidney disease, or unspecified chronic kidney disease: Secondary | ICD-10-CM | POA: Diagnosis not present

## 2022-12-24 DIAGNOSIS — L89210 Pressure ulcer of right hip, unstageable: Secondary | ICD-10-CM | POA: Diagnosis not present

## 2022-12-24 DIAGNOSIS — I69391 Dysphagia following cerebral infarction: Secondary | ICD-10-CM | POA: Diagnosis not present

## 2022-12-24 DIAGNOSIS — E7800 Pure hypercholesterolemia, unspecified: Secondary | ICD-10-CM | POA: Diagnosis not present

## 2022-12-24 DIAGNOSIS — N1831 Chronic kidney disease, stage 3a: Secondary | ICD-10-CM | POA: Diagnosis not present

## 2022-12-24 DIAGNOSIS — L89890 Pressure ulcer of other site, unstageable: Secondary | ICD-10-CM | POA: Diagnosis not present

## 2022-12-24 DIAGNOSIS — Z7982 Long term (current) use of aspirin: Secondary | ICD-10-CM | POA: Diagnosis not present

## 2022-12-27 DIAGNOSIS — L89890 Pressure ulcer of other site, unstageable: Secondary | ICD-10-CM | POA: Diagnosis not present

## 2022-12-27 DIAGNOSIS — G9341 Metabolic encephalopathy: Secondary | ICD-10-CM | POA: Diagnosis not present

## 2022-12-27 DIAGNOSIS — L89210 Pressure ulcer of right hip, unstageable: Secondary | ICD-10-CM | POA: Diagnosis not present

## 2022-12-27 DIAGNOSIS — D631 Anemia in chronic kidney disease: Secondary | ICD-10-CM | POA: Diagnosis not present

## 2022-12-27 DIAGNOSIS — I2510 Atherosclerotic heart disease of native coronary artery without angina pectoris: Secondary | ICD-10-CM | POA: Diagnosis not present

## 2022-12-27 DIAGNOSIS — Z955 Presence of coronary angioplasty implant and graft: Secondary | ICD-10-CM | POA: Diagnosis not present

## 2022-12-27 DIAGNOSIS — M5030 Other cervical disc degeneration, unspecified cervical region: Secondary | ICD-10-CM | POA: Diagnosis not present

## 2022-12-27 DIAGNOSIS — I69351 Hemiplegia and hemiparesis following cerebral infarction affecting right dominant side: Secondary | ICD-10-CM | POA: Diagnosis not present

## 2022-12-27 DIAGNOSIS — N390 Urinary tract infection, site not specified: Secondary | ICD-10-CM | POA: Diagnosis not present

## 2022-12-27 DIAGNOSIS — Z86010 Personal history of colonic polyps: Secondary | ICD-10-CM | POA: Diagnosis not present

## 2022-12-27 DIAGNOSIS — E7800 Pure hypercholesterolemia, unspecified: Secondary | ICD-10-CM | POA: Diagnosis not present

## 2022-12-27 DIAGNOSIS — Z7982 Long term (current) use of aspirin: Secondary | ICD-10-CM | POA: Diagnosis not present

## 2022-12-27 DIAGNOSIS — N1831 Chronic kidney disease, stage 3a: Secondary | ICD-10-CM | POA: Diagnosis not present

## 2022-12-27 DIAGNOSIS — M8580 Other specified disorders of bone density and structure, unspecified site: Secondary | ICD-10-CM | POA: Diagnosis not present

## 2022-12-27 DIAGNOSIS — G35 Multiple sclerosis: Secondary | ICD-10-CM | POA: Diagnosis not present

## 2022-12-27 DIAGNOSIS — F419 Anxiety disorder, unspecified: Secondary | ICD-10-CM | POA: Diagnosis not present

## 2022-12-27 DIAGNOSIS — L89322 Pressure ulcer of left buttock, stage 2: Secondary | ICD-10-CM | POA: Diagnosis not present

## 2022-12-27 DIAGNOSIS — B9620 Unspecified Escherichia coli [E. coli] as the cause of diseases classified elsewhere: Secondary | ICD-10-CM | POA: Diagnosis not present

## 2022-12-27 DIAGNOSIS — I69391 Dysphagia following cerebral infarction: Secondary | ICD-10-CM | POA: Diagnosis not present

## 2022-12-27 DIAGNOSIS — Z87891 Personal history of nicotine dependence: Secondary | ICD-10-CM | POA: Diagnosis not present

## 2022-12-27 DIAGNOSIS — I480 Paroxysmal atrial fibrillation: Secondary | ICD-10-CM | POA: Diagnosis not present

## 2022-12-27 DIAGNOSIS — I129 Hypertensive chronic kidney disease with stage 1 through stage 4 chronic kidney disease, or unspecified chronic kidney disease: Secondary | ICD-10-CM | POA: Diagnosis not present

## 2022-12-27 DIAGNOSIS — Z7901 Long term (current) use of anticoagulants: Secondary | ICD-10-CM | POA: Diagnosis not present

## 2022-12-27 DIAGNOSIS — L89312 Pressure ulcer of right buttock, stage 2: Secondary | ICD-10-CM | POA: Diagnosis not present

## 2022-12-29 DIAGNOSIS — G9341 Metabolic encephalopathy: Secondary | ICD-10-CM | POA: Diagnosis not present

## 2022-12-29 DIAGNOSIS — L89312 Pressure ulcer of right buttock, stage 2: Secondary | ICD-10-CM | POA: Diagnosis not present

## 2022-12-29 DIAGNOSIS — L89322 Pressure ulcer of left buttock, stage 2: Secondary | ICD-10-CM | POA: Diagnosis not present

## 2022-12-29 DIAGNOSIS — I69351 Hemiplegia and hemiparesis following cerebral infarction affecting right dominant side: Secondary | ICD-10-CM | POA: Diagnosis not present

## 2022-12-29 DIAGNOSIS — Z87891 Personal history of nicotine dependence: Secondary | ICD-10-CM | POA: Diagnosis not present

## 2022-12-29 DIAGNOSIS — I480 Paroxysmal atrial fibrillation: Secondary | ICD-10-CM | POA: Diagnosis not present

## 2022-12-29 DIAGNOSIS — N1831 Chronic kidney disease, stage 3a: Secondary | ICD-10-CM | POA: Diagnosis not present

## 2022-12-29 DIAGNOSIS — I69391 Dysphagia following cerebral infarction: Secondary | ICD-10-CM | POA: Diagnosis not present

## 2022-12-29 DIAGNOSIS — Z955 Presence of coronary angioplasty implant and graft: Secondary | ICD-10-CM | POA: Diagnosis not present

## 2022-12-29 DIAGNOSIS — I129 Hypertensive chronic kidney disease with stage 1 through stage 4 chronic kidney disease, or unspecified chronic kidney disease: Secondary | ICD-10-CM | POA: Diagnosis not present

## 2022-12-29 DIAGNOSIS — Z86010 Personal history of colonic polyps: Secondary | ICD-10-CM | POA: Diagnosis not present

## 2022-12-29 DIAGNOSIS — D631 Anemia in chronic kidney disease: Secondary | ICD-10-CM | POA: Diagnosis not present

## 2022-12-29 DIAGNOSIS — I2510 Atherosclerotic heart disease of native coronary artery without angina pectoris: Secondary | ICD-10-CM | POA: Diagnosis not present

## 2022-12-29 DIAGNOSIS — B9620 Unspecified Escherichia coli [E. coli] as the cause of diseases classified elsewhere: Secondary | ICD-10-CM | POA: Diagnosis not present

## 2022-12-29 DIAGNOSIS — Z7901 Long term (current) use of anticoagulants: Secondary | ICD-10-CM | POA: Diagnosis not present

## 2022-12-29 DIAGNOSIS — L89890 Pressure ulcer of other site, unstageable: Secondary | ICD-10-CM | POA: Diagnosis not present

## 2022-12-29 DIAGNOSIS — L89210 Pressure ulcer of right hip, unstageable: Secondary | ICD-10-CM | POA: Diagnosis not present

## 2022-12-29 DIAGNOSIS — Z7982 Long term (current) use of aspirin: Secondary | ICD-10-CM | POA: Diagnosis not present

## 2022-12-29 DIAGNOSIS — F419 Anxiety disorder, unspecified: Secondary | ICD-10-CM | POA: Diagnosis not present

## 2022-12-29 DIAGNOSIS — E7800 Pure hypercholesterolemia, unspecified: Secondary | ICD-10-CM | POA: Diagnosis not present

## 2022-12-29 DIAGNOSIS — G35 Multiple sclerosis: Secondary | ICD-10-CM | POA: Diagnosis not present

## 2022-12-29 DIAGNOSIS — N390 Urinary tract infection, site not specified: Secondary | ICD-10-CM | POA: Diagnosis not present

## 2022-12-29 DIAGNOSIS — M5030 Other cervical disc degeneration, unspecified cervical region: Secondary | ICD-10-CM | POA: Diagnosis not present

## 2022-12-29 DIAGNOSIS — M8580 Other specified disorders of bone density and structure, unspecified site: Secondary | ICD-10-CM | POA: Diagnosis not present

## 2022-12-30 DIAGNOSIS — G35 Multiple sclerosis: Secondary | ICD-10-CM | POA: Diagnosis not present

## 2022-12-30 DIAGNOSIS — M6281 Muscle weakness (generalized): Secondary | ICD-10-CM | POA: Diagnosis not present

## 2022-12-30 DIAGNOSIS — R269 Unspecified abnormalities of gait and mobility: Secondary | ICD-10-CM | POA: Diagnosis not present

## 2022-12-31 DIAGNOSIS — M5030 Other cervical disc degeneration, unspecified cervical region: Secondary | ICD-10-CM | POA: Diagnosis not present

## 2022-12-31 DIAGNOSIS — L89890 Pressure ulcer of other site, unstageable: Secondary | ICD-10-CM | POA: Diagnosis not present

## 2022-12-31 DIAGNOSIS — Z955 Presence of coronary angioplasty implant and graft: Secondary | ICD-10-CM | POA: Diagnosis not present

## 2022-12-31 DIAGNOSIS — Z86010 Personal history of colonic polyps: Secondary | ICD-10-CM | POA: Diagnosis not present

## 2022-12-31 DIAGNOSIS — Z87891 Personal history of nicotine dependence: Secondary | ICD-10-CM | POA: Diagnosis not present

## 2022-12-31 DIAGNOSIS — Z7982 Long term (current) use of aspirin: Secondary | ICD-10-CM | POA: Diagnosis not present

## 2022-12-31 DIAGNOSIS — D631 Anemia in chronic kidney disease: Secondary | ICD-10-CM | POA: Diagnosis not present

## 2022-12-31 DIAGNOSIS — B9620 Unspecified Escherichia coli [E. coli] as the cause of diseases classified elsewhere: Secondary | ICD-10-CM | POA: Diagnosis not present

## 2022-12-31 DIAGNOSIS — I480 Paroxysmal atrial fibrillation: Secondary | ICD-10-CM | POA: Diagnosis not present

## 2022-12-31 DIAGNOSIS — I129 Hypertensive chronic kidney disease with stage 1 through stage 4 chronic kidney disease, or unspecified chronic kidney disease: Secondary | ICD-10-CM | POA: Diagnosis not present

## 2022-12-31 DIAGNOSIS — G9341 Metabolic encephalopathy: Secondary | ICD-10-CM | POA: Diagnosis not present

## 2022-12-31 DIAGNOSIS — L89322 Pressure ulcer of left buttock, stage 2: Secondary | ICD-10-CM | POA: Diagnosis not present

## 2022-12-31 DIAGNOSIS — I69351 Hemiplegia and hemiparesis following cerebral infarction affecting right dominant side: Secondary | ICD-10-CM | POA: Diagnosis not present

## 2022-12-31 DIAGNOSIS — F419 Anxiety disorder, unspecified: Secondary | ICD-10-CM | POA: Diagnosis not present

## 2022-12-31 DIAGNOSIS — L89312 Pressure ulcer of right buttock, stage 2: Secondary | ICD-10-CM | POA: Diagnosis not present

## 2022-12-31 DIAGNOSIS — Z7901 Long term (current) use of anticoagulants: Secondary | ICD-10-CM | POA: Diagnosis not present

## 2022-12-31 DIAGNOSIS — L89210 Pressure ulcer of right hip, unstageable: Secondary | ICD-10-CM | POA: Diagnosis not present

## 2022-12-31 DIAGNOSIS — N1831 Chronic kidney disease, stage 3a: Secondary | ICD-10-CM | POA: Diagnosis not present

## 2022-12-31 DIAGNOSIS — E7800 Pure hypercholesterolemia, unspecified: Secondary | ICD-10-CM | POA: Diagnosis not present

## 2022-12-31 DIAGNOSIS — I69391 Dysphagia following cerebral infarction: Secondary | ICD-10-CM | POA: Diagnosis not present

## 2022-12-31 DIAGNOSIS — I2510 Atherosclerotic heart disease of native coronary artery without angina pectoris: Secondary | ICD-10-CM | POA: Diagnosis not present

## 2022-12-31 DIAGNOSIS — M8580 Other specified disorders of bone density and structure, unspecified site: Secondary | ICD-10-CM | POA: Diagnosis not present

## 2022-12-31 DIAGNOSIS — G35 Multiple sclerosis: Secondary | ICD-10-CM | POA: Diagnosis not present

## 2022-12-31 DIAGNOSIS — N390 Urinary tract infection, site not specified: Secondary | ICD-10-CM | POA: Diagnosis not present

## 2023-01-02 DIAGNOSIS — Z7401 Bed confinement status: Secondary | ICD-10-CM | POA: Diagnosis not present

## 2023-01-02 DIAGNOSIS — L8995 Pressure ulcer of unspecified site, unstageable: Secondary | ICD-10-CM | POA: Diagnosis not present

## 2023-01-02 DIAGNOSIS — G35 Multiple sclerosis: Secondary | ICD-10-CM | POA: Diagnosis not present

## 2023-01-02 DIAGNOSIS — G9341 Metabolic encephalopathy: Secondary | ICD-10-CM | POA: Diagnosis not present

## 2023-01-03 DIAGNOSIS — N390 Urinary tract infection, site not specified: Secondary | ICD-10-CM | POA: Diagnosis not present

## 2023-01-03 DIAGNOSIS — M5030 Other cervical disc degeneration, unspecified cervical region: Secondary | ICD-10-CM | POA: Diagnosis not present

## 2023-01-03 DIAGNOSIS — E7800 Pure hypercholesterolemia, unspecified: Secondary | ICD-10-CM | POA: Diagnosis not present

## 2023-01-03 DIAGNOSIS — Z7982 Long term (current) use of aspirin: Secondary | ICD-10-CM | POA: Diagnosis not present

## 2023-01-03 DIAGNOSIS — G9341 Metabolic encephalopathy: Secondary | ICD-10-CM | POA: Diagnosis not present

## 2023-01-03 DIAGNOSIS — Z955 Presence of coronary angioplasty implant and graft: Secondary | ICD-10-CM | POA: Diagnosis not present

## 2023-01-03 DIAGNOSIS — I69391 Dysphagia following cerebral infarction: Secondary | ICD-10-CM | POA: Diagnosis not present

## 2023-01-03 DIAGNOSIS — L89322 Pressure ulcer of left buttock, stage 2: Secondary | ICD-10-CM | POA: Diagnosis not present

## 2023-01-03 DIAGNOSIS — D631 Anemia in chronic kidney disease: Secondary | ICD-10-CM | POA: Diagnosis not present

## 2023-01-03 DIAGNOSIS — B9620 Unspecified Escherichia coli [E. coli] as the cause of diseases classified elsewhere: Secondary | ICD-10-CM | POA: Diagnosis not present

## 2023-01-03 DIAGNOSIS — Z87891 Personal history of nicotine dependence: Secondary | ICD-10-CM | POA: Diagnosis not present

## 2023-01-03 DIAGNOSIS — F419 Anxiety disorder, unspecified: Secondary | ICD-10-CM | POA: Diagnosis not present

## 2023-01-03 DIAGNOSIS — N1831 Chronic kidney disease, stage 3a: Secondary | ICD-10-CM | POA: Diagnosis not present

## 2023-01-03 DIAGNOSIS — G35 Multiple sclerosis: Secondary | ICD-10-CM | POA: Diagnosis not present

## 2023-01-03 DIAGNOSIS — I2510 Atherosclerotic heart disease of native coronary artery without angina pectoris: Secondary | ICD-10-CM | POA: Diagnosis not present

## 2023-01-03 DIAGNOSIS — L89210 Pressure ulcer of right hip, unstageable: Secondary | ICD-10-CM | POA: Diagnosis not present

## 2023-01-03 DIAGNOSIS — L89312 Pressure ulcer of right buttock, stage 2: Secondary | ICD-10-CM | POA: Diagnosis not present

## 2023-01-03 DIAGNOSIS — I69351 Hemiplegia and hemiparesis following cerebral infarction affecting right dominant side: Secondary | ICD-10-CM | POA: Diagnosis not present

## 2023-01-03 DIAGNOSIS — Z7901 Long term (current) use of anticoagulants: Secondary | ICD-10-CM | POA: Diagnosis not present

## 2023-01-03 DIAGNOSIS — M8580 Other specified disorders of bone density and structure, unspecified site: Secondary | ICD-10-CM | POA: Diagnosis not present

## 2023-01-03 DIAGNOSIS — I480 Paroxysmal atrial fibrillation: Secondary | ICD-10-CM | POA: Diagnosis not present

## 2023-01-03 DIAGNOSIS — I129 Hypertensive chronic kidney disease with stage 1 through stage 4 chronic kidney disease, or unspecified chronic kidney disease: Secondary | ICD-10-CM | POA: Diagnosis not present

## 2023-01-03 DIAGNOSIS — L89890 Pressure ulcer of other site, unstageable: Secondary | ICD-10-CM | POA: Diagnosis not present

## 2023-01-03 DIAGNOSIS — Z86010 Personal history of colonic polyps: Secondary | ICD-10-CM | POA: Diagnosis not present

## 2023-01-05 DIAGNOSIS — B9620 Unspecified Escherichia coli [E. coli] as the cause of diseases classified elsewhere: Secondary | ICD-10-CM | POA: Diagnosis not present

## 2023-01-05 DIAGNOSIS — D631 Anemia in chronic kidney disease: Secondary | ICD-10-CM | POA: Diagnosis not present

## 2023-01-05 DIAGNOSIS — G9341 Metabolic encephalopathy: Secondary | ICD-10-CM | POA: Diagnosis not present

## 2023-01-05 DIAGNOSIS — L89312 Pressure ulcer of right buttock, stage 2: Secondary | ICD-10-CM | POA: Diagnosis not present

## 2023-01-05 DIAGNOSIS — N390 Urinary tract infection, site not specified: Secondary | ICD-10-CM | POA: Diagnosis not present

## 2023-01-05 DIAGNOSIS — I129 Hypertensive chronic kidney disease with stage 1 through stage 4 chronic kidney disease, or unspecified chronic kidney disease: Secondary | ICD-10-CM | POA: Diagnosis not present

## 2023-01-05 DIAGNOSIS — I2510 Atherosclerotic heart disease of native coronary artery without angina pectoris: Secondary | ICD-10-CM | POA: Diagnosis not present

## 2023-01-05 DIAGNOSIS — L89892 Pressure ulcer of other site, stage 2: Secondary | ICD-10-CM | POA: Diagnosis not present

## 2023-01-05 DIAGNOSIS — I69351 Hemiplegia and hemiparesis following cerebral infarction affecting right dominant side: Secondary | ICD-10-CM | POA: Diagnosis not present

## 2023-01-05 DIAGNOSIS — I480 Paroxysmal atrial fibrillation: Secondary | ICD-10-CM | POA: Diagnosis not present

## 2023-01-05 DIAGNOSIS — N1831 Chronic kidney disease, stage 3a: Secondary | ICD-10-CM | POA: Diagnosis not present

## 2023-01-05 DIAGNOSIS — L89322 Pressure ulcer of left buttock, stage 2: Secondary | ICD-10-CM | POA: Diagnosis not present

## 2023-01-06 DIAGNOSIS — I129 Hypertensive chronic kidney disease with stage 1 through stage 4 chronic kidney disease, or unspecified chronic kidney disease: Secondary | ICD-10-CM | POA: Diagnosis not present

## 2023-01-06 DIAGNOSIS — N390 Urinary tract infection, site not specified: Secondary | ICD-10-CM | POA: Diagnosis not present

## 2023-01-06 DIAGNOSIS — G9341 Metabolic encephalopathy: Secondary | ICD-10-CM | POA: Diagnosis not present

## 2023-01-06 DIAGNOSIS — I480 Paroxysmal atrial fibrillation: Secondary | ICD-10-CM | POA: Diagnosis not present

## 2023-01-06 DIAGNOSIS — N1831 Chronic kidney disease, stage 3a: Secondary | ICD-10-CM | POA: Diagnosis not present

## 2023-01-06 DIAGNOSIS — L89312 Pressure ulcer of right buttock, stage 2: Secondary | ICD-10-CM | POA: Diagnosis not present

## 2023-01-06 DIAGNOSIS — D631 Anemia in chronic kidney disease: Secondary | ICD-10-CM | POA: Diagnosis not present

## 2023-01-06 DIAGNOSIS — L89892 Pressure ulcer of other site, stage 2: Secondary | ICD-10-CM | POA: Diagnosis not present

## 2023-01-06 DIAGNOSIS — L89322 Pressure ulcer of left buttock, stage 2: Secondary | ICD-10-CM | POA: Diagnosis not present

## 2023-01-06 DIAGNOSIS — B9620 Unspecified Escherichia coli [E. coli] as the cause of diseases classified elsewhere: Secondary | ICD-10-CM | POA: Diagnosis not present

## 2023-01-07 DIAGNOSIS — D631 Anemia in chronic kidney disease: Secondary | ICD-10-CM | POA: Diagnosis not present

## 2023-01-07 DIAGNOSIS — I480 Paroxysmal atrial fibrillation: Secondary | ICD-10-CM | POA: Diagnosis not present

## 2023-01-07 DIAGNOSIS — L89322 Pressure ulcer of left buttock, stage 2: Secondary | ICD-10-CM | POA: Diagnosis not present

## 2023-01-07 DIAGNOSIS — N1831 Chronic kidney disease, stage 3a: Secondary | ICD-10-CM | POA: Diagnosis not present

## 2023-01-07 DIAGNOSIS — I129 Hypertensive chronic kidney disease with stage 1 through stage 4 chronic kidney disease, or unspecified chronic kidney disease: Secondary | ICD-10-CM | POA: Diagnosis not present

## 2023-01-07 DIAGNOSIS — L89312 Pressure ulcer of right buttock, stage 2: Secondary | ICD-10-CM | POA: Diagnosis not present

## 2023-01-07 DIAGNOSIS — N390 Urinary tract infection, site not specified: Secondary | ICD-10-CM | POA: Diagnosis not present

## 2023-01-07 DIAGNOSIS — G9341 Metabolic encephalopathy: Secondary | ICD-10-CM | POA: Diagnosis not present

## 2023-01-07 DIAGNOSIS — B9620 Unspecified Escherichia coli [E. coli] as the cause of diseases classified elsewhere: Secondary | ICD-10-CM | POA: Diagnosis not present

## 2023-01-07 DIAGNOSIS — L89892 Pressure ulcer of other site, stage 2: Secondary | ICD-10-CM | POA: Diagnosis not present

## 2023-01-10 DIAGNOSIS — L89312 Pressure ulcer of right buttock, stage 2: Secondary | ICD-10-CM | POA: Diagnosis not present

## 2023-01-10 DIAGNOSIS — L89892 Pressure ulcer of other site, stage 2: Secondary | ICD-10-CM | POA: Diagnosis not present

## 2023-01-10 DIAGNOSIS — I480 Paroxysmal atrial fibrillation: Secondary | ICD-10-CM | POA: Diagnosis not present

## 2023-01-10 DIAGNOSIS — B9620 Unspecified Escherichia coli [E. coli] as the cause of diseases classified elsewhere: Secondary | ICD-10-CM | POA: Diagnosis not present

## 2023-01-10 DIAGNOSIS — N390 Urinary tract infection, site not specified: Secondary | ICD-10-CM | POA: Diagnosis not present

## 2023-01-10 DIAGNOSIS — G9341 Metabolic encephalopathy: Secondary | ICD-10-CM | POA: Diagnosis not present

## 2023-01-10 DIAGNOSIS — L89322 Pressure ulcer of left buttock, stage 2: Secondary | ICD-10-CM | POA: Diagnosis not present

## 2023-01-10 DIAGNOSIS — N1831 Chronic kidney disease, stage 3a: Secondary | ICD-10-CM | POA: Diagnosis not present

## 2023-01-10 DIAGNOSIS — D631 Anemia in chronic kidney disease: Secondary | ICD-10-CM | POA: Diagnosis not present

## 2023-01-10 DIAGNOSIS — I129 Hypertensive chronic kidney disease with stage 1 through stage 4 chronic kidney disease, or unspecified chronic kidney disease: Secondary | ICD-10-CM | POA: Diagnosis not present

## 2023-01-12 DIAGNOSIS — I129 Hypertensive chronic kidney disease with stage 1 through stage 4 chronic kidney disease, or unspecified chronic kidney disease: Secondary | ICD-10-CM | POA: Diagnosis not present

## 2023-01-12 DIAGNOSIS — L89322 Pressure ulcer of left buttock, stage 2: Secondary | ICD-10-CM | POA: Diagnosis not present

## 2023-01-12 DIAGNOSIS — N390 Urinary tract infection, site not specified: Secondary | ICD-10-CM | POA: Diagnosis not present

## 2023-01-12 DIAGNOSIS — L89892 Pressure ulcer of other site, stage 2: Secondary | ICD-10-CM | POA: Diagnosis not present

## 2023-01-12 DIAGNOSIS — G9341 Metabolic encephalopathy: Secondary | ICD-10-CM | POA: Diagnosis not present

## 2023-01-12 DIAGNOSIS — L89312 Pressure ulcer of right buttock, stage 2: Secondary | ICD-10-CM | POA: Diagnosis not present

## 2023-01-12 DIAGNOSIS — D631 Anemia in chronic kidney disease: Secondary | ICD-10-CM | POA: Diagnosis not present

## 2023-01-12 DIAGNOSIS — I480 Paroxysmal atrial fibrillation: Secondary | ICD-10-CM | POA: Diagnosis not present

## 2023-01-12 DIAGNOSIS — N1831 Chronic kidney disease, stage 3a: Secondary | ICD-10-CM | POA: Diagnosis not present

## 2023-01-12 DIAGNOSIS — B9620 Unspecified Escherichia coli [E. coli] as the cause of diseases classified elsewhere: Secondary | ICD-10-CM | POA: Diagnosis not present

## 2023-01-13 ENCOUNTER — Encounter (HOSPITAL_BASED_OUTPATIENT_CLINIC_OR_DEPARTMENT_OTHER): Payer: Medicare HMO | Attending: General Surgery | Admitting: General Surgery

## 2023-01-13 DIAGNOSIS — L89314 Pressure ulcer of right buttock, stage 4: Secondary | ICD-10-CM | POA: Insufficient documentation

## 2023-01-13 DIAGNOSIS — L89319 Pressure ulcer of right buttock, unspecified stage: Secondary | ICD-10-CM | POA: Diagnosis not present

## 2023-01-13 DIAGNOSIS — R159 Full incontinence of feces: Secondary | ICD-10-CM | POA: Insufficient documentation

## 2023-01-13 DIAGNOSIS — R32 Unspecified urinary incontinence: Secondary | ICD-10-CM | POA: Insufficient documentation

## 2023-01-13 DIAGNOSIS — L89892 Pressure ulcer of other site, stage 2: Secondary | ICD-10-CM | POA: Diagnosis not present

## 2023-01-13 DIAGNOSIS — I69351 Hemiplegia and hemiparesis following cerebral infarction affecting right dominant side: Secondary | ICD-10-CM | POA: Insufficient documentation

## 2023-01-13 DIAGNOSIS — L89224 Pressure ulcer of left hip, stage 4: Secondary | ICD-10-CM | POA: Insufficient documentation

## 2023-01-13 DIAGNOSIS — L89229 Pressure ulcer of left hip, unspecified stage: Secondary | ICD-10-CM | POA: Diagnosis not present

## 2023-01-13 DIAGNOSIS — G35 Multiple sclerosis: Secondary | ICD-10-CM | POA: Diagnosis not present

## 2023-01-13 DIAGNOSIS — I69319 Unspecified symptoms and signs involving cognitive functions following cerebral infarction: Secondary | ICD-10-CM | POA: Diagnosis not present

## 2023-01-14 DIAGNOSIS — L89312 Pressure ulcer of right buttock, stage 2: Secondary | ICD-10-CM | POA: Diagnosis not present

## 2023-01-14 DIAGNOSIS — N390 Urinary tract infection, site not specified: Secondary | ICD-10-CM | POA: Diagnosis not present

## 2023-01-14 DIAGNOSIS — G9341 Metabolic encephalopathy: Secondary | ICD-10-CM | POA: Diagnosis not present

## 2023-01-14 DIAGNOSIS — L89322 Pressure ulcer of left buttock, stage 2: Secondary | ICD-10-CM | POA: Diagnosis not present

## 2023-01-14 DIAGNOSIS — I480 Paroxysmal atrial fibrillation: Secondary | ICD-10-CM | POA: Diagnosis not present

## 2023-01-14 DIAGNOSIS — L89892 Pressure ulcer of other site, stage 2: Secondary | ICD-10-CM | POA: Diagnosis not present

## 2023-01-14 DIAGNOSIS — I129 Hypertensive chronic kidney disease with stage 1 through stage 4 chronic kidney disease, or unspecified chronic kidney disease: Secondary | ICD-10-CM | POA: Diagnosis not present

## 2023-01-14 DIAGNOSIS — D631 Anemia in chronic kidney disease: Secondary | ICD-10-CM | POA: Diagnosis not present

## 2023-01-14 DIAGNOSIS — B9620 Unspecified Escherichia coli [E. coli] as the cause of diseases classified elsewhere: Secondary | ICD-10-CM | POA: Diagnosis not present

## 2023-01-14 DIAGNOSIS — N1831 Chronic kidney disease, stage 3a: Secondary | ICD-10-CM | POA: Diagnosis not present

## 2023-01-15 NOTE — Progress Notes (Addendum)
[name], Kaitlin ([id number]) [id number] Page 1 of 12 Visit Report for 01/13/2023 Allergy List Details Patient Name: Date of Service: Kaitlin [name], Kaitlin [name]. 01/13/2023 1:30 PM Medical Record Number: [id number] Patient Account Number: [id number] Date of Birth/Sex: Treating RN: 28-Sep-1945 (77 y.o. Kaitlin [name] Primary Care Arlen Legendre: Hoyle Sauer Other Clinician: Referring Queenie Aufiero: Treating Aviance Cooperwood/Extender: Joellyn Quails, Ravisankar R Weeks in Treatment: 0 Allergies Active Allergies Eliquis penicillin Allergy Notes Electronic Signature(s) Signed: 01/13/2023 3:49:47 PM By: Samuella Bruin Entered By: Samuella Bruin on 01/13/2023 13:46:19 -------------------------------------------------------------------------------- Arrival Information Details Patient Name: Date of Service: Kaitlin [name], Kaitlin Quin S. 01/13/2023 1:30 PM Medical Record Number: [id number] Patient Account Number: [id number] Date of Birth/Sex: Treating RN: 1946/05/14 (77 y.o. Kaitlin [name] Primary Care Marsh Heckler: Hoyle Sauer Other Clinician: Referring Toriann Spadoni: Treating Leniya Breit/Extender: Theodis Shove Weeks in Treatment: 0 Visit Information Patient Arrived: Wheel Chair Arrival Time: 13:42 Accompanied By: husband Transfer Assistance: Manual Patient Identification Verified: Yes Secondary Verification Process Completed: Yes Patient Requires Transmission-Based Precautions: No Patient Has Alerts: Yes Patient Alerts: Patient on Blood Thinner Electronic Signature(s) Signed: 01/13/2023 3:49:47 PM By: Samuella Bruin Entered By: Samuella Bruin on 01/13/2023 13:45:47 Placido Sou ([id number]) [id number] Page 2 of 12 -------------------------------------------------------------------------------- Clinic Level of Care Assessment Details Patient Name: Date of Service: Kaitlin, [name] 01/13/2023 1:30  PM Medical Record Number: [id number] Patient Account Number: [id number] Date of Birth/Sex: Treating RN: 08/14/1945 (77 y.o. Kaitlin [name] Primary Care Maddox Bratcher: Hoyle Sauer Other Clinician: Referring Makaylee Spielberg: Treating Dvante Hands/Extender: Joellyn Quails, Ravisankar R Weeks in Treatment: 0 Clinic Level of Care Assessment Items TOOL 1 Quantity Score X- 1 0 Use when EandM and Procedure is performed on INITIAL visit ASSESSMENTS - Nursing Assessment / Reassessment X- 1 20 General Physical Exam (combine w/ comprehensive assessment (listed just below) when performed on new pt. evals) X- 1 25 Comprehensive Assessment (HX, ROS, Risk Assessments, Wounds Hx, etc.) ASSESSMENTS - Wound and Skin Assessment / Reassessment []  - 0 Dermatologic / Skin Assessment (not related to wound area) ASSESSMENTS - Ostomy and/or Continence Assessment and Care []  - 0 Incontinence Assessment and Management []  - 0 Ostomy Care Assessment and Management (repouching, etc.) PROCESS - Coordination of Care X - Simple Patient / Family Education for ongoing care 1 15 []  - 0 Complex (extensive) Patient / Family Education for ongoing care X- 1 10 Staff obtains Chiropractor, Records, T Results / Process Orders est []  - 0 Staff telephones HHA, Nursing Homes / Clarify orders / etc []  - 0 Routine Transfer to another Facility (non-emergent condition) []  - 0 Routine Hospital Admission (non-emergent condition) X- 1 15 New Admissions / Manufacturing engineer / Ordering NPWT Apligraf, etc. , []  - 0 Emergency Hospital Admission (emergent condition) PROCESS - Special Needs []  - 0 Pediatric / Minor Patient Management []  - 0 Isolation Patient Management []  - 0 Hearing / Language / Visual special needs []  - 0 Assessment of Community assistance (transportation, D/C planning, etc.) []  - 0 Additional assistance / Altered mentation X- 1 15 Support Surface(s) Assessment (bed, cushion, seat,  etc.) INTERVENTIONS - Miscellaneous []  - 0 External ear exam []  - 0 Patient Transfer (multiple staff / Nurse, adult / Similar devices) []  - 0 Simple Staple / Suture removal (25 or less) []  - 0 Complex Staple / Suture removal (26 or more) []  - 0 Hypo/Hyperglycemic Management (do not check if billed separately) []  - 0 Ankle / Brachial Index (ABI) - do not check if billed separately Has  the patient been seen at the hospital within the last three years: Yes Total Score: 100 Level Of Care: New/Established - Level 3 Electronic Signature(s) Signed: 01/13/2023 3:49:47 PM By: Samuella Bruin Entered By: Samuella Bruin on 01/13/2023 15:46:43 Placido Sou ([id number]) [id number] Page 3 of 12 -------------------------------------------------------------------------------- Encounter Discharge Information Details Patient Name: Date of Service: Kaitlin, [name] 01/13/2023 1:30 PM Medical Record Number: [id number] Patient Account Number: [id number] Date of Birth/Sex: Treating RN: [birth year] (78 y.o. Kaitlin [name] Primary Care Saranya Harlin: Hoyle Sauer Other Clinician: Referring Geoffrey Hynes: Treating Tylan Kinn/Extender: Theodis Shove Weeks in Treatment: 0 Encounter Discharge Information Items Post Procedure Vitals Discharge Condition: Stable Temperature (F): 97.5 Ambulatory Status: Wheelchair Pulse (bpm): 63 Discharge Destination: Home Respiratory Rate (breaths/min): 18 Transportation: Private Auto Blood Pressure (mmHg): 93/48 Accompanied By: husband Schedule Follow-up Appointment: Yes Clinical Summary of Care: Patient Declined Electronic Signature(s) Signed: 01/13/2023 3:49:47 PM By: Samuella Bruin Entered By: Samuella Bruin on 01/13/2023 15:47:46 -------------------------------------------------------------------------------- Lower Extremity Assessment Details Patient Name: Date of Service: Kaitlin [name]  01/13/2023 1:30 PM Medical Record Number: [id number] Patient Account Number: [id number] Date of Birth/Sex: Treating RN: 08/28/1945 (77 y.o. Kaitlin [name] Primary Care Skiler Tye: Hoyle Sauer Other Clinician: Referring Keyshon Stein: Treating Myla Mauriello/Extender: Joellyn Quails, Ravisankar R Weeks in Treatment: 0 Electronic Signature(s) Signed: 01/13/2023 3:49:47 PM By: Gelene Mink By: Samuella Bruin on 01/13/2023 13:50:52 -------------------------------------------------------------------------------- Multi Wound Chart Details Patient Name: Date of Service: Kaitlin [name], Kaitlin Quin S. 01/13/2023 1:30 PM Medical Record Number: [id number] Patient Account Number: [id number] Date of Birth/Sex: Treating RN: 07/03/1946 (77 y.o. F) Primary Care Erina Hamme: Hoyle Sauer Other Clinician: Referring Asiyah Pineau: Treating Deveion Denz/Extender: Joellyn Quails, Ravisankar R Weeks in Treatment: 0 Vital Signs Height(in): Pulse(bpm): 63 Weight(lbs): Blood Pressure(mmHg): 93/48 Body Mass Index(BMI): Temperature(F): 97.5 Respiratory Rate(breaths/min): [address] ([id number]) [id number] Page 4 of 12 [1:Photos:] [3:No Photos] Left Trochanter Right Ischium Left, Medial Knee Wound Location: Pressure Injury Pressure Injury Pressure Injury Wounding Event: Pressure Ulcer Pressure Ulcer Pressure Ulcer Primary Etiology: Anemia, Arrhythmia, Coronary Artery Anemia, Arrhythmia, Coronary Artery Anemia, Arrhythmia, Coronary Artery Comorbid History: Disease, Hypertension Disease, Hypertension Disease, Hypertension 11/01/2022 11/01/2022 11/01/2022 Date Acquired: 0 0 0 Weeks of Treatment: Open Open Open Wound Status: No No No Wound Recurrence: 9.4x6x0.5 6.9x3.5x1.3 0.5x0.5x0.1 Measurements L x W x D (cm) 44.296 18.967 0.196 A (cm) : rea 22.148 24.658 0.02 Volume (cm) : 6 Starting Position 1 (o'clock): 10 Ending Position 1  (o'clock): 1.5 Maximum Distance 1 (cm): Yes No No Undermining: Category/Stage IV Category/Stage IV Category/Stage II Classification: Medium Medium Medium Exudate A mount: Serous Serosanguineous Serous Exudate Type: amber red, brown amber Exudate Color: Distinct, outline attached Distinct, outline attached Distinct, outline attached Wound Margin: Small (1-33%) Small (1-33%) Large (67-100%) Granulation A mount: Red Red Red Granulation Quality: Large (67-100%) Large (67-100%) Small (1-33%) Necrotic A mount: Eschar Eschar, Adherent Slough Adherent Slough Necrotic Tissue: Fat Layer (Subcutaneous Tissue): Yes Fat Layer (Subcutaneous Tissue): Yes Fat Layer (Subcutaneous Tissue): Yes Exposed Structures: Muscle: Yes Muscle: Yes Fascia: No Fascia: No Fascia: No Tendon: No Tendon: No Tendon: No Muscle: No Joint: No Joint: No Joint: No Bone: No Bone: No Bone: No None None Small (1-33%) Epithelialization: Debridement - Excisional Debridement - Excisional Debridement - Selective/Open Wound Debridement: Pre-procedure Verification/Time Out 14:22 14:22 14:22 Taken: Lidocaine 4% Topical Solution Lidocaine 4% Topical Solution Lidocaine 4% Topical Solution Pain Control: Muscle, Slough Muscle, Fat Slough Tissue Debrided: Skin/Subcutaneous Tissue/Muscle Skin/Subcutaneous Tissue/Muscle Non-Viable Tissue Level: 44.27 18.96  0.2 Debridement A (sq cm): rea Curette Curette Curette Instrument: Minimum Minimum Minimum Bleeding: Pressure Pressure Pressure Hemostasis A chieved: Procedure was tolerated well Procedure was tolerated well Procedure was tolerated well Debridement Treatment Response: 9.4x6x0.5 6.9x3.5x1.3 0.5x0.5x0.1 Post Debridement Measurements L x W x D (cm) 22.148 24.658 0.02 Post Debridement Volume: (cm) Category/Stage IV Category/Stage IV Category/Stage II Post Debridement Stage: No Abnormalities Noted No Abnormalities Noted No Abnormalities Noted Periwound  Skin Texture: No Abnormalities Noted No Abnormalities Noted No Abnormalities Noted Periwound Skin Moisture: No Abnormalities Noted No Abnormalities Noted No Abnormalities Noted Periwound Skin Color: No Abnormality No Abnormality No Abnormality Temperature: Debridement Debridement Debridement Procedures Performed: Treatment Notes Wound #1 (Trochanter) Wound Laterality: Left Cleanser Soap and Water Discharge Instruction: May shower and wash wound with dial antibacterial soap and water prior to dressing change. Wound Cleanser Discharge Instruction: Cleanse the wound with wound cleanser prior to applying a clean dressing using gauze sponges, not tissue or cotton balls. Peri-Wound Care Topical Primary Dressing Dakin's Solution 0.25%, 16 (oz) Discharge Instruction: Moisten gauze with Dakin's solution LAELYNN, DESCHAINE ([id number]) [phone].pdf Page 5 of 12 Secondary Dressing ABD Pad, 8x10 Discharge Instruction: Apply over primary dressing as directed. Woven Gauze Sponge, Non-Sterile 4x4 in Discharge Instruction: Apply over primary dressing as directed. Secured With 50M Medipore H Soft Cloth Surgical T ape, 4 x 10 (in/yd) Discharge Instruction: Secure with tape as directed. Compression Wrap Compression Stockings Add-Ons Wound #2 (Ischium) Wound Laterality: Right Cleanser Soap and Water Discharge Instruction: May shower and wash wound with dial antibacterial soap and water prior to dressing change. Wound Cleanser Discharge Instruction: Cleanse the wound with wound cleanser prior to applying a clean dressing using gauze sponges, not tissue or cotton balls. Peri-Wound Care Topical Primary Dressing Dakin's Solution 0.25%, 16 (oz) Discharge Instruction: Moisten gauze with Dakin's solution Secondary Dressing ABD Pad, 8x10 Discharge Instruction: Apply over primary dressing as directed. Woven Gauze Sponge, Non-Sterile 4x4 in Discharge Instruction: Apply over  primary dressing as directed. Secured With 50M Medipore H Soft Cloth Surgical T ape, 4 x 10 (in/yd) Discharge Instruction: Secure with tape as directed. Compression Wrap Compression Stockings Add-Ons Wound #3 (Knee) Wound Laterality: Left, Medial Cleanser Soap and Water Discharge Instruction: May shower and wash wound with dial antibacterial soap and water prior to dressing change. Wound Cleanser Discharge Instruction: Cleanse the wound with wound cleanser prior to applying a clean dressing using gauze sponges, not tissue or cotton balls. Peri-Wound Care Topical Primary Dressing Maxorb Extra Ag+ Alginate Dressing, 2x2 (in/in) Discharge Instruction: Apply to wound bed as instructed Secondary Dressing ALLEVYN Gentle Border, 3x3 (in/in) Discharge Instruction: Apply over primary dressing as directed. Secured With Compression Wrap Compression Stockings Add-Ons Kaitlin [name], Kaitlin [name] ([id number]) [id number].pdf Page 6 of 12 Electronic Signature(s) Signed: 01/13/2023 4:09:23 PM By: Duanne Guess MD FACS Entered By: Duanne Guess on 01/13/2023 16:09:23 -------------------------------------------------------------------------------- Multi-Disciplinary Care Plan Details Patient Name: Date of Service: Kaitlin [name], [place] S. 01/13/2023 1:30 PM Medical Record Number: [id number] Patient Account Number: [id number] Date of Birth/Sex: Treating RN: [birth year] (77 y.o. Kaitlin [name] Primary Care Vershawn Westrup: Hoyle Sauer Other Clinician: Referring Jovonni Borquez: Treating Mavis Gravelle/Extender: Theodis Shove Weeks in Treatment: 0 Active Inactive Necrotic Tissue Nursing Diagnoses: Impaired tissue integrity related to necrotic/devitalized tissue Knowledge deficit related to management of necrotic/devitalized tissue Goals: Necrotic/devitalized tissue will be minimized in the wound bed Date Initiated: 01/13/2023 Target Resolution Date: 03/11/2023 Goal  Status: Active Patient/caregiver will verbalize understanding of reason and process for debridement of necrotic tissue Date  Initiated: 01/13/2023 Target Resolution Date: 03/11/2023 Goal Status: Active Interventions: Assess patient pain level pre-, during and post procedure and prior to discharge Provide education on necrotic tissue and debridement process Treatment Activities: Apply topical anesthetic as ordered : 01/13/2023 Notes: Wound/Skin Impairment Nursing Diagnoses: Impaired tissue integrity Knowledge deficit related to ulceration/compromised skin integrity Goals: Patient/caregiver will verbalize understanding of skin care regimen Date Initiated: 01/13/2023 Target Resolution Date: 03/11/2023 Goal Status: Active Interventions: Assess ulceration(s) every visit Treatment Activities: Skin care regimen initiated : 01/13/2023 Topical wound management initiated : 01/13/2023 Notes: Electronic Signature(s) Signed: 01/13/2023 3:49:47 PM By: Samuella Bruin Entered By: Samuella Bruin on 01/13/2023 15:46:07 Placido Sou ([id number]) [id number] Page 7 of 12 -------------------------------------------------------------------------------- Pain Assessment Details Patient Name: Date of Service: Kaitlin [name], Kaitlin [name]. 01/13/2023 1:30 PM Medical Record Number: [id number] Patient Account Number: [id number] Date of Birth/Sex: Treating RN: 13-Aug-1945 (77 y.o. Kaitlin [name] Primary Care Tanae Petrosky: Hoyle Sauer Other Clinician: Referring Geonna Lockyer: Treating Sumaya Riedesel/Extender: Theodis Shove Weeks in Treatment: 0 Active Problems Location of Pain Severity and Description of Pain Patient Has Paino No Site Locations Rate the pain. Current Pain Level: 0 Pain Management and Medication Current Pain Management: Electronic Signature(s) Signed: 01/13/2023 3:49:47 PM By: Samuella Bruin Entered By: Samuella Bruin on 01/13/2023  13:51:46 -------------------------------------------------------------------------------- Patient/Caregiver Education Details Patient Name: Date of Service: Kaitlin [name] 6/13/2024andnbsp1:30 PM Medical Record Number: [id number] Patient Account Number: [id number] Date of Birth/Gender: Treating RN: 14-Nov-1945 (77 y.o. Kaitlin [name] Primary Care Physician: Hoyle Sauer Other Clinician: Referring Physician: Treating Physician/Extender: Wenda Low in Treatment: 0 Education Assessment Education Provided To: Caregiver Education Topics Provided Offloading: Methods: Explain/Verbal Responses: Reinforcements needed, State content correctly MAKAELA, PLUNK ([id number]) [phone].pdf Page 8 of 12 Wound Debridement: Methods: Explain/Verbal Responses: Reinforcements needed, State content correctly Electronic Signature(s) Signed: 01/13/2023 3:49:47 PM By: Samuella Bruin Entered By: Samuella Bruin on 01/13/2023 15:46:21 -------------------------------------------------------------------------------- Wound Assessment Details Patient Name: Date of Service: Kaitlin [name]. 01/13/2023 1:30 PM Medical Record Number: [id number] Patient Account Number: [id number] Date of Birth/Sex: Treating RN: 06/04/1946 (77 y.o. Kaitlin [name] Primary Care Kalijah Westfall: Hoyle Sauer Other Clinician: Referring Juri Dinning: Treating Evelisse Szalkowski/Extender: Joellyn Quails, Ravisankar R Weeks in Treatment: 0 Wound Status Wound Number: 1 Primary Etiology: Pressure Ulcer Wound Location: Left Trochanter Wound Status: Open Wounding Event: Pressure Injury Comorbid Anemia, Arrhythmia, Coronary Artery Disease, History: Hypertension Date Acquired: 11/01/2022 Weeks Of Treatment: 0 Clustered Wound: No Photos Wound Measurements Length: (cm) 9.4 Width: (cm) 6 Depth: (cm) 0.5 Area: (cm) 44.296 Volume: (cm) 22.148 % Reduction in  Area: % Reduction in Volume: Epithelialization: None Undermining: Yes Starting Position (o'clock): 6 Ending Position (o'clock): 10 Maximum Distance: (cm) 1.5 Wound Description Classification: Category/Stage IV Wound Margin: Distinct, outline attached Exudate Amount: Medium Exudate Type: Serous Exudate Color: amber Foul Odor After Cleansing: No Slough/Fibrino Yes Wound Bed Granulation Amount: Small (1-33%) Exposed Structure Granulation Quality: Red Fascia Exposed: No Necrotic Amount: Large (67-100%) Fat Layer (Subcutaneous Tissue) Exposed: Yes Necrotic Quality: Eschar Tendon Exposed: No Muscle Exposed: Yes Necrosis of Muscle: Yes Joint Exposed: No Kaitlin [name], Kaitlin [name] ([id number]) [id number] Page 9 of 12 Bone Exposed: No Periwound Skin Texture Texture Color No Abnormalities Noted: Yes No Abnormalities Noted: Yes Moisture Temperature / Pain No Abnormalities Noted: Yes Temperature: No Abnormality Treatment Notes Wound #1 (Trochanter) Wound Laterality: Left Cleanser Soap and Water Discharge Instruction: May shower and wash wound with dial antibacterial soap and water prior to dressing change. Wound Cleanser  Discharge Instruction: Cleanse the wound with wound cleanser prior to applying a clean dressing using gauze sponges, not tissue or cotton balls. Peri-Wound Care Topical Primary Dressing Dakin's Solution 0.25%, 16 (oz) Discharge Instruction: Moisten gauze with Dakin's solution Secondary Dressing ABD Pad, 8x10 Discharge Instruction: Apply over primary dressing as directed. Woven Gauze Sponge, Non-Sterile 4x4 in Discharge Instruction: Apply over primary dressing as directed. Secured With 7M Medipore H Soft Cloth Surgical T ape, 4 x 10 (in/yd) Discharge Instruction: Secure with tape as directed. Compression Wrap Compression Stockings Add-Ons Electronic Signature(s) Signed: 01/13/2023 3:49:47 PM By: Samuella Bruin Entered By: Samuella Bruin on 01/13/2023 14:32:58 -------------------------------------------------------------------------------- Wound Assessment Details Patient Name: Date of Service: Kaitlin [name]. 01/13/2023 1:30 PM Medical Record Number: [id number] Patient Account Number: [id number] Date of Birth/Sex: Treating RN: 12/22/1945 (77 y.o. Kaitlin [name] Primary Care Kerith Sherley: Hoyle Sauer Other Clinician: Referring Kylee Nardozzi: Treating Kathrin Folden/Extender: Joellyn Quails, Ravisankar R Weeks in Treatment: 0 Wound Status Wound Number: 2 Primary Etiology: Pressure Ulcer Wound Location: Right Ischium Wound Status: Open Wounding Event: Pressure Injury Comorbid Anemia, Arrhythmia, Coronary Artery Disease, History: Hypertension Date Acquired: 11/01/2022 Weeks Of Treatment: 0 Clustered Wound: No Photos AHMIRACLE, UPCHURCH ([id number]) [id number] Page 10 of 12 Wound Measurements Length: (cm) 6.9 Width: (cm) 3.5 Depth: (cm) 1.3 Area: (cm) 18.967 Volume: (cm) 24.658 % Reduction in Area: % Reduction in Volume: Epithelialization: None Tunneling: No Undermining: No Wound Description Classification: Category/Stage IV Wound Margin: Distinct, outline attached Exudate Amount: Medium Exudate Type: Serosanguineous Exudate Color: red, brown Foul Odor After Cleansing: No Slough/Fibrino Yes Wound Bed Granulation Amount: Small (1-33%) Exposed Structure Granulation Quality: Red Fascia Exposed: No Necrotic Amount: Large (67-100%) Fat Layer (Subcutaneous Tissue) Exposed: Yes Necrotic Quality: Eschar, Adherent Slough Tendon Exposed: No Muscle Exposed: Yes Necrosis of Muscle: Yes Joint Exposed: No Bone Exposed: No Periwound Skin Texture Texture Color No Abnormalities Noted: Yes No Abnormalities Noted: Yes Moisture Temperature / Pain No Abnormalities Noted: Yes Temperature: No Abnormality Treatment Notes Wound #2 (Ischium) Wound Laterality: Right Cleanser Soap  and Water Discharge Instruction: May shower and wash wound with dial antibacterial soap and water prior to dressing change. Wound Cleanser Discharge Instruction: Cleanse the wound with wound cleanser prior to applying a clean dressing using gauze sponges, not tissue or cotton balls. Peri-Wound Care Topical Primary Dressing Dakin's Solution 0.25%, 16 (oz) Discharge Instruction: Moisten gauze with Dakin's solution Secondary Dressing ABD Pad, 8x10 Discharge Instruction: Apply over primary dressing as directed. Woven Gauze Sponge, Non-Sterile 4x4 in Discharge Instruction: Apply over primary dressing as directed. Secured With 7M Medipore H Soft Cloth Surgical T ape, 4 x 10 (in/yd) Discharge Instruction: Secure with tape as directed. Compression Kaitlin [name], Kaitlin [name] ([id number]) [phone].pdf Page 11 of 12 Compression Stockings Add-Ons Electronic Signature(s) Signed: 01/13/2023 3:49:47 PM By: Samuella Bruin Entered By: Samuella Bruin on 01/13/2023 14:33:10 -------------------------------------------------------------------------------- Wound Assessment Details Patient Name: Date of Service: Kaitlin [name] 01/13/2023 1:30 PM Medical Record Number: [id number] Patient Account Number: [id number] Date of Birth/Sex: Treating RN: 10-28-1945 (77 y.o. Kaitlin [name] Primary Care Maguadalupe Kaitlin [name]: Hoyle Sauer Other Clinician: Referring Freddie Nghiem: Treating Kysean Sweet/Extender: Joellyn Quails, Ravisankar R Weeks in Treatment: 0 Wound Status Wound Number: 3 Primary Etiology: Pressure Ulcer Wound Location: Left, Medial Knee Wound Status: Open Wounding Event: Pressure Injury Comorbid Anemia, Arrhythmia, Coronary Artery Disease, History: Hypertension Date Acquired: 11/01/2022 Weeks Of Treatment: 0 Clustered Wound: No Wound Measurements Length: (cm) 0.5 Width: (cm) 0.5 Depth: (cm) 0.1 Area: (cm) 0.196 Volume: (cm)  0.02 % Reduction in Area: %  Reduction in Volume: Epithelialization: Small (1-33%) Tunneling: No Undermining: No Wound Description Classification: Category/Stage II Wound Margin: Distinct, outline attached Exudate Amount: Medium Exudate Type: Serous Exudate Color: amber Foul Odor After Cleansing: No Slough/Fibrino Yes Wound Bed Granulation Amount: Large (67-100%) Exposed Structure Granulation Quality: Red Fascia Exposed: No Necrotic Amount: Small (1-33%) Fat Layer (Subcutaneous Tissue) Exposed: Yes Necrotic Quality: Adherent Slough Tendon Exposed: No Muscle Exposed: No Joint Exposed: No Bone Exposed: No Periwound Skin Texture Texture Color No Abnormalities Noted: Yes No Abnormalities Noted: Yes Moisture Temperature / Pain No Abnormalities Noted: Yes Temperature: No Abnormality Treatment Notes Wound #3 (Knee) Wound Laterality: Left, Medial Cleanser Soap and Water Discharge Instruction: May shower and wash wound with dial antibacterial soap and water prior to dressing change. Wound Cleanser Discharge Instruction: Cleanse the wound with wound cleanser prior to applying a clean dressing using gauze sponges, not tissue or cotton balls. Peri-Wound Care ASIJAH, HLAVATY ([id number]) [id number] Page 12 of 12 Topical Primary Dressing Maxorb Extra Ag+ Alginate Dressing, 2x2 (in/in) Discharge Instruction: Apply to wound bed as instructed Secondary Dressing ALLEVYN Gentle Border, 3x3 (in/in) Discharge Instruction: Apply over primary dressing as directed. Secured With Compression Wrap Compression Stockings Facilities manager) Signed: 01/13/2023 3:49:47 PM By: Samuella Bruin Entered By: Samuella Bruin on 01/13/2023 14:38:02 -------------------------------------------------------------------------------- Vitals Details Patient Name: Date of Service: Kaitlin [name], Kaitlin Quin S. 01/13/2023 1:30 PM Medical Record Number: [id number] Patient Account Number: [id number] Date  of Birth/Sex: Treating RN: 1946/04/04 (77 y.o. Kaitlin [name] Primary Care Gavin Telford: Hoyle Sauer Other Clinician: Referring Vandana Haman: Treating Alani Sabbagh/Extender: Joellyn Quails, Ravisankar R Weeks in Treatment: 0 Vital Signs Time Taken: 13:51 Temperature (F): 97.5 Pulse (bpm): 63 Respiratory Rate (breaths/min): 18 Blood Pressure (mmHg): 93/48 Reference Range: 80 - 120 mg / dl Electronic Signature(s) Signed: 01/13/2023 3:49:47 PM By: Samuella Bruin Entered By: Samuella Bruin on 01/13/2023 13:52:03

## 2023-01-15 NOTE — Progress Notes (Signed)
AMADIS, FUHRER ([id number]) [id number] Page 1 of 4 Visit Report for 01/13/2023 Abuse Risk Screen Details Patient Name: Date of Service: Kaitlin [name], Kaitlin [name]. 01/13/2023 1:30 PM Medical Record Number: [id number] Patient Account Number: [id number] Date of Birth/Sex: Treating RN: [birth year] (77 y.o. Fredderick Phenix Primary Care Leathia Farnell: Hoyle Sauer Other Clinician: Referring Consuelo Suthers: Treating Almedia Cordell/Extender: Joellyn Quails, Ravisankar R Weeks in Treatment: 0 Abuse Risk Screen Items Answer ABUSE RISK SCREEN: Has anyone close to you tried to hurt or harm you recentlyo No Do you feel uncomfortable with anyone in your familyo No Has anyone forced you do things that you didnt want to doo No Electronic Signature(s) Signed: 01/13/2023 3:49:47 PM By: Samuella Bruin Entered By: Samuella Bruin on 01/13/2023 13:48:23 -------------------------------------------------------------------------------- Activities of Daily Living Details Patient Name: Date of Service: Kaitlin [name], Kaitlin [name] 01/13/2023 1:30 PM Medical Record Number: [id number] Patient Account Number: [id number] Date of Birth/Sex: Treating RN: 1946-05-20 (77 y.o. Fredderick Phenix Primary Care Jaryan Chicoine: Hoyle Sauer Other Clinician: Referring Kashayla Ungerer: Treating Margery Szostak/Extender: Joellyn Quails, Ravisankar R Weeks in Treatment: 0 Activities of Daily Living Items Answer Activities of Daily Living (Please select one for each item) Drive Automobile Not Able T Medications ake Not Able Use T elephone Not Able Care for Appearance Not Able Use T oilet Not Able Mady Haagensen / Shower Not Able Dress Self Not Able Feed Self Not Able Walk Not Able Get In / Out Bed Not Able Housework Not Able Prepare Meals Not Able Handle Money Not Able Shop for Self Not Able Electronic Signature(s) Signed: 01/13/2023 3:49:47 PM By: Samuella Bruin Entered By: Samuella Bruin on  01/13/2023 13:48:58 Placido Sou ([id number]) [id number] Page 2 of 4 -------------------------------------------------------------------------------- Education Screening Details Patient Name: Date of Service: Kaitlin [name], Kaitlin [name] 01/13/2023 1:30 PM Medical Record Number: [id number] Patient Account Number: [id number] Date of Birth/Sex: Treating RN: Jun 15, 1946 (77 y.o. Fredderick Phenix Primary Care Briana Newman: Hoyle Sauer Other Clinician: Referring Khaliah Barnick: Treating Oda Placke/Extender: Wenda Low in Treatment: 0 Primary Learner Assessed: Patient Learning Preferences/Education Level/Primary Language Learning Preference: Explanation, Demonstration, Video, Printed Material Highest Education Level: College or Above Preferred Language: English Cognitive Barrier Language Barrier: No Translator Needed: No Memory Deficit: No Emotional Barrier: No Cultural/Religious Beliefs Affecting Medical Care: No Physical Barrier Impaired Vision: Yes Glasses Impaired Hearing: No Decreased Hand dexterity: No Knowledge/Comprehension Knowledge Level: Medium Comprehension Level: Medium Ability to understand written instructions: Medium Ability to understand verbal instructions: Medium Motivation Anxiety Level: Calm Cooperation: Cooperative Education Importance: Acknowledges Need Interest in Health Problems: Asks Questions Perception: Coherent Willingness to Engage in Self-Management Medium Activities: Readiness to Engage in Self-Management Medium Activities: Electronic Signature(s) Signed: 01/13/2023 3:49:47 PM By: Samuella Bruin Entered By: Samuella Bruin on 01/13/2023 13:49:24 -------------------------------------------------------------------------------- Fall Risk Assessment Details Patient Name: Date of Service: Kaitlin [name], Kaitlin Quin S. 01/13/2023 1:30 PM Medical Record Number: [id number] Patient Account Number:  [id number] Date of Birth/Sex: Treating RN: [birth year] (77 y.o. Fredderick Phenix Primary Care Claudis Giovanelli: Hoyle Sauer Other Clinician: Referring Saivon Prowse: Treating Natoya Viscomi/Extender: Theodis Shove Weeks in Treatment: 0 Fall Risk Assessment Items Have you had 2 or more falls in the last [address] VINNIE, CARDOSA ([id number]) [id number] Page 3 of 4 Have you had any fall that resulted in injury in the last 12 monthso 0 No FALLS RISK SCREEN History of falling - immediate or within 3 months 0 No Secondary diagnosis (Do you have 2 or more medical diagnoseso)  15 Yes Ambulatory aid None/bed rest/wheelchair/nurse 0 Yes Crutches/cane/walker 0 No Furniture 0 No Intravenous therapy Access/Saline/Heparin Lock 0 No Gait/Transferring Normal/ bed rest/ wheelchair 0 Yes Weak (short steps with or without shuffle, stooped but able to lift head while walking, may seek 0 No support from furniture) Impaired (short steps with shuffle, may have difficulty arising from chair, head down, impaired 0 No balance) Mental Status Oriented to own ability 0 Yes Electronic Signature(s) Signed: 01/13/2023 3:49:47 PM By: Samuella Bruin Entered By: Samuella Bruin on 01/13/2023 13:49:50 -------------------------------------------------------------------------------- Foot Assessment Details Patient Name: Date of Service: Kaitlin [name], Kaitlin Quin S. 01/13/2023 1:30 PM Medical Record Number: [id number] Patient Account Number: [id number] Date of Birth/Sex: Treating RN: October 19, 1945 (77 y.o. Fredderick Phenix Primary Care Aliesha Dolata: Hoyle Sauer Other Clinician: Referring Reino Lybbert: Treating Teofilo Lupinacci/Extender: Joellyn Quails, Ravisankar R Weeks in Treatment: 0 Foot Assessment Items Site Locations + = Sensation present, - = Sensation absent, C = Callus, U = Ulcer R = Redness, W = Warmth, M = Maceration, PU = Pre-ulcerative lesion F =  Fissure, S = Swelling, D = Dryness Assessment Right: Left: Other Deformity: No No Prior Foot Ulcer: No No Prior Amputation: No No Charcot Joint: No No Ambulatory Status: Non-ambulatory Assistance Device: Wheelchair Kaitlin [name], Kaitlin [name] ([id number]) [id number] Page 4 of 4 Gait: Notes no hx of diabetes or neuropathy Electronic Signature(s) Signed: 01/13/2023 3:49:47 PM By: Samuella Bruin Entered By: Samuella Bruin on 01/13/2023 13:50:42 -------------------------------------------------------------------------------- Nutrition Risk Screening Details Patient Name: Date of Service: Kaitlin [name], Kaitlin [name] 01/13/2023 1:30 PM Medical Record Number: [id number] Patient Account Number: [id number] Date of Birth/Sex: Treating RN: 04-04-1946 (77 y.o. Fredderick Phenix Primary Care Kyrstin Campillo: Hoyle Sauer Other Clinician: Referring Tarena Gockley: Treating Ford Peddie/Extender: Joellyn Quails, Ravisankar R Weeks in Treatment: 0 Height (in): Weight (lbs): Body Mass Index (BMI): Nutrition Risk Screening Items Score Screening NUTRITION RISK SCREEN: I have an illness or condition that made me change the kind and/or amount of food I eat 0 No I eat fewer than two meals per day 0 No I eat few fruits and vegetables, or milk products 0 No I have three or more drinks of beer, liquor or wine almost every day 0 No I have tooth or mouth problems that make it hard for me to eat 0 No I don't always have enough money to buy the food I need 0 No I eat alone most of the time 0 No I take three or more different prescribed or over-the-counter drugs a day 1 Yes Without wanting to, I have lost or gained 10 pounds in the last six months 0 No I am not always physically able to shop, cook and/or feed myself 2 Yes Nutrition Protocols Good Risk Protocol Moderate Risk Protocol 0 Provide education on nutrition High Risk Proctocol Risk Level: Moderate Risk Score:  3 Electronic Signature(s) Signed: 01/13/2023 3:49:47 PM By: Samuella Bruin Entered By: Samuella Bruin on 01/13/2023 13:50:19

## 2023-01-15 NOTE — Progress Notes (Signed)
Kaitlin [name], Kaitlin [name] ([id number]) [id number] Page 1 of 12 Visit Report for 01/13/2023 Chief Complaint Document Details Patient Name: Date of Service: Kaitlin [name], Kaitlin [name] 01/13/2023 1:30 PM Medical Record Number: [id number] Patient Account Number: [id number] Date of Birth/Sex: Treating RN: Jul 08, 1946 (77 y.o. F) Primary Care Provider: Hoyle [name] Other Clinician: Referring Provider: Treating Provider/Extender: Kaitlin [name], Kaitlin [name] in Treatment: 0 Information Obtained from: Patient Chief Complaint Patient is at the clinic for treatment of multiple open pressure ulcers Electronic Signature(s) Signed: 01/13/2023 4:11:40 PM By: Duanne Guess MD FACS Previous Signature: 01/13/2023 1:50:39 PM Version By: Duanne Guess MD FACS Entered By: Duanne Guess on 01/13/2023 16:11:39 -------------------------------------------------------------------------------- Debridement Details Patient Name: Date of Service: Kaitlin [name], Kaitlin Quin S. 01/13/2023 1:30 PM Medical Record Number: [id number] Patient Account Number: [id number] Date of Birth/Sex: Treating RN: 29-May-1946 (77 y.o. F) Primary Care Provider: Hoyle [name] Other Clinician: Referring Provider: Treating Provider/Extender: Kaitlin [name] [name] in Treatment: 0 Debridement Performed for Assessment: Wound #1 Left Trochanter Performed By: Physician Duanne Guess, MD Debridement Type: Debridement Level of Consciousness (Pre-procedure): Awake and Alert Pre-procedure Verification/Time Out Yes - 14:22 Taken: Start Time: 14:22 Pain Control: Lidocaine 4% T opical Solution Percent of Wound Bed Debrided: 100% T Area Debrided (cm): otal 44.27 Tissue and other material debrided: Non-Viable, Muscle, Slough, Subcutaneous, Slough Level: Skin/Subcutaneous Tissue/Muscle Debridement Description: Excisional Instrument: Curette Specimen: Tissue Culture Number of Specimens T aken:  1 Bleeding: Minimum Hemostasis Achieved: Pressure Response to Treatment: Procedure was tolerated well Level of Consciousness (Post- Awake and Alert procedure): Post Debridement Measurements of Total Wound Length: (cm) 9.4 Stage: Category/Stage IV Width: (cm) 6 Depth: (cm) 0.5 Volume: (cm) 22.148 Character of Wound/Ulcer Post Debridement: Improved Kaitlin [name], Kaitlin [name] ([id number]) [id number] Page 2 of 12 Post Procedure Diagnosis Same as Pre-procedure Notes Scribed for Dr. Lady Gary by Kaitlin Bruin, RN Electronic Signature(s) Signed: 01/13/2023 4:10:37 PM By: Duanne Guess MD FACS Previous Signature: 01/13/2023 3:49:47 PM Version By: Kaitlin [name] Entered By: Duanne Guess on 01/13/2023 16:10:37 -------------------------------------------------------------------------------- Debridement Details Patient Name: Date of Service: Kaitlin [name], Kaitlin Quin S. 01/13/2023 1:30 PM Medical Record Number: [id number] Patient Account Number: [id number] Date of Birth/Sex: Treating RN: 08-Jun-1946 (77 y.o. F) Primary Care Provider: Hoyle [name] Other Clinician: Referring Provider: Treating Provider/Extender: Kaitlin [name] [name] in Treatment: 0 Debridement Performed for Assessment: Wound #3 Left,Medial Knee Performed By: Physician Duanne Guess, MD Debridement Type: Debridement Level of Consciousness (Pre-procedure): Awake and Alert Pre-procedure Verification/Time Out Yes - 14:22 Taken: Start Time: 14:22 Pain Control: Lidocaine 4% T opical Solution Percent of Wound Bed Debrided: 100% T Area Debrided (cm): otal 0.2 Tissue and other material debrided: Non-Viable, Slough, Slough Level: Non-Viable Tissue Debridement Description: Selective/Open Wound Instrument: Curette Bleeding: Minimum Hemostasis Achieved: Pressure Response to Treatment: Procedure was tolerated well Level of Consciousness (Post- Awake and Alert procedure): Post  Debridement Measurements of Total Wound Length: (cm) 0.5 Stage: Category/Stage II Width: (cm) 0.5 Depth: (cm) 0.1 Volume: (cm) 0.02 Character of Wound/Ulcer Post Debridement: Improved Post Procedure Diagnosis Same as Pre-procedure Notes scribed for Dr. Lady Gary by Kaitlin Bruin, RN Electronic Signature(s) Signed: 01/13/2023 4:11:17 PM By: Duanne Guess MD FACS Previous Signature: 01/13/2023 3:49:47 PM Version By: Kaitlin [name] Entered By: Duanne Guess on 01/13/2023 16:11:17 Kaitlin [name] ([id number]) [id number] Page 3 of 12 -------------------------------------------------------------------------------- Debridement Details Patient Name: Date of Service: Kaitlin [name], [name] 01/13/2023 1:30 PM Medical Record Number: [id number] Patient Account Number: [id number] Date of Birth/Sex: Treating RN: 1945-09-13 (77 y.o. F)  Primary Care Provider: Hoyle [name] Other Clinician: Referring Provider: Treating Provider/Extender: Kaitlin [name] [name] in Treatment: 0 Debridement Performed for Assessment: Wound #2 Right Ischium Performed By: Physician Duanne Guess, MD Debridement Type: Debridement Level of Consciousness (Pre-procedure): Awake and Alert Pre-procedure Verification/Time Out Yes - 14:22 Taken: Start Time: 14:22 Pain Control: Lidocaine 4% Topical Solution Percent of Wound Bed Debrided: 100% T Area Debrided (cm): otal 18.96 Tissue and other material debrided: Non-Viable, Fat, Muscle, Slough, Slough Level: Skin/Subcutaneous Tissue/Muscle Debridement Description: Excisional Instrument: Curette Bleeding: Minimum Hemostasis Achieved: Pressure Response to Treatment: Procedure was tolerated well Level of Consciousness (Post- Awake and Alert procedure): Post Debridement Measurements of Total Wound Length: (cm) 6.9 Stage: Category/Stage IV Width: (cm) 3.5 Depth: (cm) 1.3 Volume: (cm) 24.658 Character of  Wound/Ulcer Post Debridement: Improved Post Procedure Diagnosis Same as Pre-procedure Notes Scribed for Dr. Lady Gary by Kaitlin Bruin, RN Electronic Signature(s) Signed: 01/13/2023 4:11:29 PM By: Duanne Guess MD FACS Previous Signature: 01/13/2023 4:10:52 PM Version By: Duanne Guess MD FACS Previous Signature: 01/13/2023 3:49:47 PM Version By: Kaitlin [name] Entered By: Duanne Guess on 01/13/2023 16:11:29 -------------------------------------------------------------------------------- HPI Details Patient Name: Date of Service: Kaitlin [name], Kaitlin Quin S. 01/13/2023 1:30 PM Medical Record Number: [id number] Patient Account Number: [id number] Date of Birth/Sex: Treating RN: September 17, 1945 (77 y.o. 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 She has developed pressure injuries to her left trochanter, her right ischium, and her left knee. She is accompanied today by her home health aide and husband. They report that they are in the process of moving her to a skilled nursing facility. They have been applying Santyl to the wounds. She is currently on a regular hospital bed but will have a low air loss mattress at her facility. Kaitlin [name], Kaitlin [name] ([id number]) [id number] Page 4 of 12 Electronic Signature(s) Signed: 01/13/2023 4:12:54 PM By: Duanne Guess MD FACS Previous Signature: 01/13/2023 1:53:19 PM Version By: Duanne Guess MD FACS Entered By: Duanne Guess on 01/13/2023  16:12:54 -------------------------------------------------------------------------------- Physical Exam Details Patient Name: Date of Service: Kaitlin Devoid S. 01/13/2023 1:30 PM Medical Record Number: [id number] Patient Account Number: [id number] Date of Birth/Sex: Treating RN: 10/30/1945 (77 y.o. F) Primary Care Provider: Hoyle [name] Other Clinician: Referring Provider: Treating Provider/Extender: Kaitlin [name], Kaitlin [name] in Treatment: 0 Constitutional Hypotensive, asymptomatic; normal for this patient. . Silvestre Gunner chronically ill but in no acute distress. Respiratory Normal work of breathing on room air. Cardiovascular Contractures of all extremities. Notes 01/13/2023: On her left greater trochanter, there is a large ulcer with frankly necrotic muscle. It smells putrid. There is liquefactive necrosis present. There is a similar-looking ulcer on her right ischium but much of the necrotic tissue is fat. Muscle is exposed, however, and is nonviable. On her medial left knee, there is a small stage II ulcer. Electronic Signature(s) Signed: 01/13/2023 4:14:37 PM By: Duanne Guess MD FACS Entered By: Duanne Guess on 01/13/2023 16:14:37 -------------------------------------------------------------------------------- Physician Orders Details Patient Name: Date of Service: Kaitlin [name], Kaitlin Quin S. 01/13/2023 1:30 PM Medical Record Number: [id number] Patient Account Number: [id number] Date of Birth/Sex: Treating RN: Sep 27, 1945 (77 y.o. Fredderick Phenix Primary Care Provider: Hoyle [name] Other  Clinician: Referring Provider: Treating Provider/Extender: Kaitlin [name], Kaitlin [name] in Treatment: 0 Verbal / Phone Orders: No Diagnosis Coding ICD-10 Coding Code Description L89.319 Pressure ulcer of right buttock, unspecified stage L89.329 Pressure ulcer of left buttock, unspecified stage L89.899 Pressure ulcer of other site, unspecified  stage G35 Multiple sclerosis G81.91 Hemiplegia, unspecified affecting right dominant side I69.319 Unspecified symptoms and signs involving cognitive functions following cerebral infarction R32 Unspecified urinary incontinence R15.9 Full incontinence of feces Follow-up Appointments Kaitlin [name], FIELDER ([id number]) [id number] Page 5 of 12 ppointment in 1 week. - Dr. Lady Gary - room 2 - 6/20 at 9:15 AM Return A Anesthetic (In clinic) Topical Lidocaine 4% applied to wound bed Bathing/ Shower/ Hygiene May shower and wash wound with soap and water. - with dressing changes Off-Loading Low air-loss mattress (Group 2) Turn and reposition every 2 hours Home Health New wound care orders this week; continue Home Health for wound care. May utilize formulary equivalent dressing for wound treatment orders unless otherwise specified. Dressing changes to be completed by Home Health on Monday / Wednesday / Friday except when patient has scheduled visit at [facility]. Other Home Health Orders/Instructions: - [facility] Wound Treatment Wound #1 - Trochanter Wound Laterality: Left Cleanser: Soap and Water Other:1x per day and if the dressing gets soiled by urine or stool/30 Days Discharge Instructions: May shower and wash wound with dial antibacterial soap and water prior to dressing change. Cleanser: Wound Cleanser Other:1x per day and if the dressing gets soiled by urine or stool/30 Days Discharge Instructions: Cleanse the wound with wound cleanser prior to applying a clean dressing using gauze sponges, not tissue or cotton balls. Prim Dressing: Dakin's Solution 0.25%, 16 (oz) Other:1x per day and if the dressing gets soiled by urine or stool/30 Days ary Discharge Instructions: Moisten gauze with Dakin's solution Secondary Dressing: ABD Pad, 8x10 Other:1x per day and if the dressing gets soiled by urine or stool/30 Days Discharge Instructions: Apply over primary dressing  as directed. Secondary Dressing: Woven Gauze Sponge, Non-Sterile 4x4 in Other:1x per day and if the dressing gets soiled by urine or stool/30 Days Discharge Instructions: Apply over primary dressing as directed. Secured With: 56M Medipore H Soft Cloth Surgical T ape, 4 x 10 (in/yd) Other:1x per day and if the dressing gets soiled by urine or stool/30 Days Discharge Instructions: Secure with tape as directed. Wound #2 - Ischium Wound Laterality: Right Cleanser: Soap and Water Other:1x per day and if the dressing gets soiled by urine or stool/30 Days Discharge Instructions: May shower and wash wound with dial antibacterial soap and water prior to dressing change. Cleanser: Wound Cleanser Other:1x per day and if the dressing gets soiled by urine or stool/30 Days Discharge Instructions: Cleanse the wound with wound cleanser prior to applying a clean dressing using gauze sponges, not tissue or cotton balls. Prim Dressing: Dakin's Solution 0.25%, 16 (oz) Other:1x per day and if the dressing gets soiled by urine or stool/30 Days ary Discharge Instructions: Moisten gauze with Dakin's solution Secondary Dressing: ABD Pad, 8x10 Other:1x per day and if the dressing gets soiled by urine or stool/30 Days Discharge Instructions: Apply over primary dressing as directed. Secondary Dressing: Woven Gauze Sponge, Non-Sterile 4x4 in Other:1x per day and if the dressing gets soiled by urine or stool/30 Days Discharge Instructions: Apply over primary dressing as directed. Secured With: 56M Medipore H Soft Cloth Surgical T ape, 4 x 10 (in/yd) Other:1x per day and if the dressing gets soiled by urine or  stool/30 Days Discharge Instructions: Secure with tape as directed. Wound #3 - Knee Wound Laterality: Left, Medial Cleanser: Soap and Water 1 x Per Day/30 Days Discharge Instructions: May shower and wash wound with dial antibacterial soap and water prior to dressing change. Cleanser: Wound Cleanser 1 x Per Day/30  Days Discharge Instructions: Cleanse the wound with wound cleanser prior to applying a clean dressing using gauze sponges, not tissue or cotton balls. Prim Dressing: Maxorb Extra Ag+ Alginate Dressing, 2x2 (in/in) 1 x Per Day/30 Days ary Discharge Instructions: Apply to wound bed as instructed Secondary Dressing: ALLEVYN Gentle Border, 3x3 (in/in) 1 x Per Day/30 Days Discharge Instructions: Apply over primary dressing as directed. Patient Medications llergies: Eliquis, penicillin A Notifications Medication Indication [address] JRUE, PATAKI ([id number]) [id number] Page 6 of 12 01/14/2023 lidocaine DOSE topical 4 % cream - cream topical 01/13/2023 Dakin's Solution DOSE miscellaneous 0.25 % solution - Moisten gauze with solution for dressing changes Electronic Signature(s) Signed: 01/13/2023 4:25:23 PM By: Duanne Guess MD FACS Previous Signature: 01/13/2023 4:19:54 PM Version By: Duanne Guess MD FACS Previous Signature: 01/13/2023 3:49:47 PM Version By: Kaitlin [name] Entered By: Duanne Guess on 01/13/2023 16:20:06 -------------------------------------------------------------------------------- Problem List Details Patient Name: Date of Service: Kaitlin [name], Kaitlin Quin S. 01/13/2023 1:30 PM Medical Record Number: [id number] Patient Account Number: [id number] Date of Birth/Sex: Treating RN: [birth year] (77 y.o. F) Primary Care Provider: Hoyle [name] Other Clinician: Referring Provider: Treating Provider/Extender: Kaitlin [name], Kaitlin [name] in Treatment: 0 Active Problems ICD-10 Encounter Code Description Active Date MDM Diagnosis L89.314 Pressure ulcer of right buttock, stage 4 01/13/2023 No Yes L89.224 Pressure ulcer of left hip, stage 4 01/13/2023 No Yes L89.892 Pressure ulcer of other site, stage 2 01/13/2023 No Yes G35 Multiple sclerosis 01/13/2023 No Yes G81.91 Hemiplegia, unspecified affecting right dominant side 01/13/2023  No Yes I69.319 Unspecified symptoms and signs involving cognitive functions following 01/13/2023 No Yes cerebral infarction R32 Unspecified urinary incontinence 01/13/2023 No Yes R15.9 Full incontinence of feces 01/13/2023 No Yes Inactive Problems Resolved Problems Electronic Signature(s) Signed: 01/13/2023 4:09:12 PM By: Duanne Guess MD FACS [place],Signed: 01/13/2023 4:09:12 PM By: Duanne Guess MD FACS Kaitlin [name] ([id number]) [id number] Page 7 of 12 Previous Signature: 01/13/2023 1:50:17 PM Version By: Duanne Guess MD FACS Entered By: Duanne Guess on 01/13/2023 16:09:11 -------------------------------------------------------------------------------- Progress Note Details Patient Name: Date of Service: Kaitlin [name], [place] S. 01/13/2023 1:30 PM Medical Record Number: [id number] Patient Account Number: [id number] Date of Birth/Sex: Treating RN: Oct 08, 1945 (77 y.o. 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 She has developed pressure injuries to her left trochanter, her right ischium, and her left knee. She is accompanied today by her home health aide and husband. They report that they are in the process of moving her to a skilled nursing facility. They have been applying Santyl to the wounds. She is currently on a regular hospital bed but will have a low air loss mattress at her facility. Patient  History Allergies Eliquis, penicillin Social History Former smoker - quit 40 yrs ago, Marital Status - Married, Alcohol Use -  Daily, Drug Use - No History, Caffeine Use - Daily. Medical History Hematologic/Lymphatic Patient has history of Anemia Cardiovascular Patient has history of Arrhythmia - a-fib, Coronary Artery Disease, Hypertension Hospitalization/Surgery History - Ir radiologist eval and mgmt. - Ir intravsc stent. - Radiology with anesthesia. - Coronary angioplasty with stent placement. - Neck surgery. - Wrist surgery (Left). Medical A Surgical History Notes nd Respiratory Acute hypoxemic respiratory failure Cardiovascular hypertensive cardiovascular disease, Atherosclerotic cardiovascular disease Musculoskeletal arthritis, DDD, MS, osteopenia Neurologic stroke, right hemiparesis Review of Systems (ROS) Constitutional Symptoms (General Health) Denies complaints or symptoms of Fatigue, Fever, Chills, Marked Weight Change. Eyes Denies complaints or symptoms of Dry Eyes, Vision Changes, Glasses / Contacts. Ear/Nose/Mouth/Throat Denies complaints or symptoms of Chronic sinus problems or rhinitis, dysphagia Gastrointestinal Denies complaints or symptoms of Frequent diarrhea, Nausea, Vomiting. Endocrine Denies complaints or symptoms of Heat/cold intolerance. Genitourinary Denies complaints or symptoms of Frequent urination. Integumentary (Skin) Complains or has symptoms of Wounds. Psychiatric Denies complaints or symptoms of Claustrophobia. Kaitlin [name], Kaitlin [name] ([id number]) [id number] Page 8 of 12 Objective Constitutional Hypotensive, asymptomatic; normal for this patient. Appears chronically ill but in no acute distress. Vitals Time Taken: 1:51 PM, Temperature: 97.5 F, Pulse: 63 bpm, Respiratory Rate: 18 breaths/min, Blood Pressure: 93/48 mmHg. Respiratory Normal work of breathing on room air. Cardiovascular Contractures of all  extremities. General Notes: 01/13/2023: On her left greater trochanter, there is a large ulcer with frankly necrotic muscle. It smells putrid. There is liquefactive necrosis present. There is a similar-looking ulcer on her right ischium but much of the necrotic tissue is fat. Muscle is exposed, however, and is nonviable. On her medial left knee, there is a small stage II ulcer. Integumentary (Hair, Skin) Wound #1 status is Open. Original cause of wound was Pressure Injury. The date acquired was: 11/01/2022. The wound is located on the Left Trochanter. The wound measures 9.4cm length x 6cm width x 0.5cm depth; 44.296cm^2 area and 22.148cm^3 volume. There is muscle and Fat Layer (Subcutaneous Tissue) exposed. There is undermining starting at 6:00 and ending at 10:00 with a maximum distance of 1.5cm. There is a medium amount of serous drainage noted. The wound margin is distinct with the outline attached to the wound base. There is small (1-33%) red granulation within the wound bed. There is a large (67-100%) amount of necrotic tissue within the wound bed including Eschar and Necrosis of Muscle. The periwound skin appearance had no abnormalities noted for texture. The periwound skin appearance had no abnormalities noted for moisture. The periwound skin appearance had no abnormalities noted for color. Periwound temperature was noted as No Abnormality. Wound #2 status is Open. Original cause of wound was Pressure Injury. The date acquired was: 11/01/2022. The wound is located on the Right Ischium. The wound measures 6.9cm length x 3.5cm width x 1.3cm depth; 18.967cm^2 area and 24.658cm^3 volume. There is muscle and Fat Layer (Subcutaneous Tissue) exposed. There is no tunneling or undermining noted. There is a medium amount of serosanguineous drainage noted. The wound margin is distinct with the outline attached to the wound base. There is small (1-33%) red granulation within the wound bed. There is a large  (67-100%) amount of necrotic tissue within the wound bed including Eschar, Adherent Slough and Necrosis of Muscle. The periwound skin appearance had no abnormalities noted for texture. The periwound skin appearance had no abnormalities noted for moisture. The periwound skin appearance had no abnormalities noted for color. Periwound temperature was noted as No Abnormality. Wound #3 status is Open.  Original cause of wound was Pressure Injury. The date acquired was: 11/01/2022. The wound is located on the Left,Medial Knee. The wound measures 0.5cm length x 0.5cm width x 0.1cm depth; 0.196cm^2 area and 0.02cm^3 volume. There is Fat Layer (Subcutaneous Tissue) exposed. There is no tunneling or undermining noted. There is a medium amount of serous drainage noted. The wound margin is distinct with the outline attached to the wound base. There is large (67-100%) red granulation within the wound bed. There is a small (1-33%) amount of necrotic tissue within the wound bed including Adherent Slough. The periwound skin appearance had no abnormalities noted for texture. The periwound skin appearance had no abnormalities noted for moisture. The periwound skin appearance had no abnormalities noted for color. Periwound temperature was noted as No Abnormality. Assessment Active Problems ICD-10 Pressure ulcer of right buttock, stage 4 Pressure ulcer of left hip, stage 4 Pressure ulcer of other site, stage 2 Multiple sclerosis Hemiplegia, unspecified affecting right dominant side Unspecified symptoms and signs involving cognitive functions following cerebral infarction Unspecified urinary incontinence Full incontinence of feces Procedures Wound #1 Pre-procedure diagnosis of Wound #1 is a Pressure Ulcer located on the Left Trochanter . There was a Excisional Skin/Subcutaneous Tissue/Muscle Debridement with a total area of 44.27 sq cm performed by Duanne Guess, MD. With the following instrument(s): Curette to  remove Non-Viable tissue/material. Material removed includes Muscle, Subcutaneous Tissue, and Slough after achieving pain control using Lidocaine 4% T opical Solution. 1 specimen was taken by a Tissue Culture and sent to the lab per facility protocol. A time out was conducted at 14:22, prior to the start of the procedure. A Minimum amount of bleeding was controlled with Pressure. The procedure was tolerated well. Post Debridement Measurements: 9.4cm length x 6cm width x 0.5cm depth; 22.148cm^3 volume. Post debridement Stage noted as Category/Stage IV. Character of Wound/Ulcer Post Debridement is improved. Post procedure Diagnosis Wound #1: Same as Pre-Procedure General Notes: Scribed for Dr. Lady Gary by Kaitlin Bruin, RN. Wound #2 Pre-procedure diagnosis of Wound #2 is a Pressure Ulcer located on the Right Ischium . There was a Excisional Skin/Subcutaneous Tissue/Muscle Debridement with a total area of 18.96 sq cm performed by Duanne Guess, MD. With the following instrument(s): Curette to remove Non-Viable tissue/material. Material removed includes Fat, Muscle, and Slough after achieving pain control using Lidocaine 4% T opical Solution. No specimens were taken. A time out was conducted at 14:22, prior to the start of the procedure. A Minimum amount of bleeding was controlled with Pressure. The procedure was tolerated well. Post Debridement Measurements: 6.9cm length x 3.5cm width x 1.3cm depth; 24.658cm^3 volume. Post debridement Stage noted as Category/Stage IV. Character of Wound/Ulcer Post Debridement is improved. Post procedure Diagnosis Wound #2: Same as Pre-Procedure Kaitlin [name], Kaitlin [name] ([id number]) [id number] Page 9 of 12 General Notes: Scribed for Dr. Lady Gary by Kaitlin Bruin, RN. Wound #3 Pre-procedure diagnosis of Wound #3 is a Pressure Ulcer located on the Left,Medial Knee . There was a Selective/Open Wound Non-Viable Tissue Debridement with a total  area of 0.2 sq cm performed by Duanne Guess, MD. With the following instrument(s): Curette to remove Non-Viable tissue/material. Material removed includes [facility] after achieving pain control using Lidocaine 4% Topical Solution. No specimens were taken. A time out was conducted at 14:22, prior to the start of the procedure. A Minimum amount of bleeding was controlled with Pressure. The procedure was tolerated well. Post Debridement Measurements: 0.5cm length x 0.5cm width x 0.1cm depth; 0.02cm^3 volume. Post debridement Stage noted as  Category/Stage II. Character of Wound/Ulcer Post Debridement is improved. Post procedure Diagnosis Wound #3: Same as Pre-Procedure General Notes: scribed for Dr. Lady Gary by Kaitlin Bruin, RN. Plan Follow-up Appointments: Return Appointment in 1 week. - Dr. Lady Gary - room 2 - 6/20 at 9:15 AM Anesthetic: (In clinic) Topical Lidocaine 4% applied to wound bed Bathing/ Shower/ Hygiene: May shower and wash wound with soap and water. - with dressing changes Off-Loading: Low air-loss mattress (Group 2) Turn and reposition every 2 hours Home Health: New wound care orders this week; continue Home Health for wound care. May utilize formulary equivalent dressing for wound treatment orders unless otherwise specified. Dressing changes to be completed by Home Health on Monday / Wednesday / Friday except when patient has scheduled visit at [facility]. Other Home Health Orders/Instructions: - [facility] The following medication(s) was prescribed: lidocaine topical 4 % cream cream topical was prescribed at facility Dakin's Solution miscellaneous 0.25 % solution Moisten gauze with solution for dressing changes starting 01/13/2023 WOUND #1: - Trochanter Wound Laterality: Left Cleanser: Soap and Water Other:1x per day and if the dressing gets soiled by urine or stool/30 Days Discharge Instructions: May shower and wash wound with dial antibacterial soap and water prior  to dressing change. Cleanser: Wound Cleanser Other:1x per day and if the dressing gets soiled by urine or stool/30 Days Discharge Instructions: Cleanse the wound with wound cleanser prior to applying a clean dressing using gauze sponges, not tissue or cotton balls. Prim Dressing: Dakin's Solution 0.25%, 16 (oz) Other:1x per day and if the dressing gets soiled by urine or stool/30 Days ary Discharge Instructions: Moisten gauze with Dakin's solution Secondary Dressing: ABD Pad, 8x10 Other:1x per day and if the dressing gets soiled by urine or stool/30 Days Discharge Instructions: Apply over primary dressing as directed. Secondary Dressing: Woven Gauze Sponge, Non-Sterile 4x4 in Other:1x per day and if the dressing gets soiled by urine or stool/30 Days Discharge Instructions: Apply over primary dressing as directed. Secured With: 32M Medipore H Soft Cloth Surgical T ape, 4 x 10 (in/yd) Other:1x per day and if the dressing gets soiled by urine or stool/30 Days Discharge Instructions: Secure with tape as directed. WOUND #2: - Ischium Wound Laterality: Right Cleanser: Soap and Water Other:1x per day and if the dressing gets soiled by urine or stool/30 Days Discharge Instructions: May shower and wash wound with dial antibacterial soap and water prior to dressing change. Cleanser: Wound Cleanser Other:1x per day and if the dressing gets soiled by urine or stool/30 Days Discharge Instructions: Cleanse the wound with wound cleanser prior to applying a clean dressing using gauze sponges, not tissue or cotton balls. Prim Dressing: Dakin's Solution 0.25%, 16 (oz) Other:1x per day and if the dressing gets soiled by urine or stool/30 Days ary Discharge Instructions: Moisten gauze with Dakin's solution Secondary Dressing: ABD Pad, 8x10 Other:1x per day and if the dressing gets soiled by urine or stool/30 Days Discharge Instructions: Apply over primary dressing as directed. Secondary Dressing: Woven Gauze  Sponge, Non-Sterile 4x4 in Other:1x per day and if the dressing gets soiled by urine or stool/30 Days Discharge Instructions: Apply over primary dressing as directed. Secured With: 32M Medipore H Soft Cloth Surgical T ape, 4 x 10 (in/yd) Other:1x per day and if the dressing gets soiled by urine or stool/30 Days Discharge Instructions: Secure with tape as directed. WOUND #3: - Knee Wound Laterality: Left, Medial Cleanser: Soap and Water 1 x Per Day/30 Days Discharge Instructions: May shower and wash  wound with dial antibacterial soap and water prior to dressing change. Cleanser: Wound Cleanser 1 x Per Day/30 Days Discharge Instructions: Cleanse the wound with wound cleanser prior to applying a clean dressing using gauze sponges, not tissue or cotton balls. Prim Dressing: Maxorb Extra Ag+ Alginate Dressing, 2x2 (in/in) 1 x Per Day/30 Days ary Discharge Instructions: Apply to wound bed as instructed Secondary Dressing: ALLEVYN Gentle Border, 3x3 (in/in) 1 x Per Day/30 Days Discharge Instructions: Apply over primary dressing as directed. 01/13/2023: On her left greater trochanter, there is a large ulcer with frankly necrotic muscle. It smells putrid. There is liquefactive necrosis present. There is a similar-looking ulcer on her right ischium but much of the necrotic tissue is fat. Muscle is exposed, however, and is nonviable. On her medial left knee, there is a small stage II ulcer. I used a combination of scalpel, forceps, scissors, and curette to debride necrotic muscle, subcutaneous tissue, fat, and slough from the left trochanter and right ischial ulcers. I took a culture from the left ischial ulcer. Will complete his wounds need aggressive care. He will need further debridement. For now, I want to use Dakin's solution for wet-to-dry dressing changes on a daily basis to try to clean them up some. Once the culture data return, I will make appropriate therapeutic interventions. The more superficial  ulcer on her knee will be treated with silver alginate and a foam border dressing. The importance of offloading and adequate protein intake were discussed with the patient's husband and her caregiver. I think getting her to her skilled nursing facility and the low-air-loss mattress bed will be important. I want to see her on a weekly basis. Electronic Signature(s) Signed: 01/13/2023 4:20:19 PM By: Duanne Guess MD FACS [place],Signed: 01/13/2023 4:20:19 PM By: Duanne Guess MD FACS Kaitlin [name] ([id number]) [id number] Page 10 of 12 Previous Signature: 01/13/2023 4:18:36 PM Version By: Duanne Guess MD FACS Entered By: Duanne Guess on 01/13/2023 16:20:18 -------------------------------------------------------------------------------- Kaitlin Details Patient Name: Date of Service: Kaitlin [name], Kaitlin S. 01/13/2023 1:30 PM Medical Record Number: [id number] Patient Account Number: [id number] Date of Birth/Sex: Treating RN: 1945/12/19 (77 y.o. Fredderick Phenix Primary Care Provider: Hoyle [name] Other Clinician: Referring Provider: Treating Provider/Extender: Kaitlin [name], Kaitlin [name] in Treatment: 0 Constitutional Symptoms (General Health) Complaints and Symptoms: Negative for: Fatigue; Fever; Chills; Marked Weight Change Eyes Complaints and Symptoms: Negative for: Dry Eyes; Vision Changes; Glasses / Contacts Ear/Nose/Mouth/Throat Complaints and Symptoms: Negative for: Chronic sinus problems or rhinitis Review of System Notes: dysphagia Gastrointestinal Complaints and Symptoms: Negative for: Frequent diarrhea; Nausea; Vomiting Endocrine Complaints and Symptoms: Negative for: Heat/cold intolerance Genitourinary Complaints and Symptoms: Negative for: Frequent urination Integumentary (Skin) Complaints and Symptoms: Positive for: Wounds Psychiatric Complaints and Symptoms: Negative for:  Claustrophobia Hematologic/Lymphatic Medical History: Positive for: Anemia Respiratory Medical History: Past Medical History Notes: Acute hypoxemic respiratory failure Cardiovascular Medical History: Positive for: Arrhythmia - a-fib; Coronary Artery Disease; Hypertension Past Medical History Notes: hypertensive cardiovascular disease, Atherosclerotic cardiovascular disease DAMITA, CIACCIA ([id number]) [id number] Page 11 of 12 Immunological Musculoskeletal Medical History: Past Medical History Notes: arthritis, DDD, MS, osteopenia Neurologic Medical History: Past Medical History Notes: stroke, right hemiparesis Oncologic Immunizations Pneumococcal Vaccine: Received Pneumococcal Vaccination: Yes Received Pneumococcal Vaccination On or After 60th Birthday: Yes Implantable Devices No devices added Hospitalization / Surgery History Type of Hospitalization/Surgery Ir radiologist eval and mgmt Ir intravsc stent Radiology with anesthesia Coronary angioplasty with stent placement Neck surgery Wrist surgery (Left) Family and Social History Former smoker -  quit 40 yrs ago; Marital Status - Married; Alcohol Use: Daily; Drug Use: No History; Caffeine Use: Daily; Financial Concerns: No; Food, Clothing or Shelter Needs: No; Support System Lacking: No; Transportation Concerns: No Electronic Signature(s) Signed: 01/13/2023 3:49:47 PM By: Kaitlin [name] Signed: 01/13/2023 4:25:23 PM By: Duanne Guess MD FACS Entered By: Kaitlin [name] on 01/13/2023 13:48:17 -------------------------------------------------------------------------------- SuperBill Details Patient Name: Date of Service: Kaitlin [name], Kaitlin [name]. 01/13/2023 Medical Record Number: [id number] Patient Account Number: [id number] Date of Birth/Sex: Treating RN: 15-Mar-1946 (77 y.o. 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 R32 Unspecified urinary incontinence KIREN, CODAY ([id number]) [id number] Page 12 of 12 R15.9 Full incontinence of feces Facility Procedures : CPT4 Code: 44034742 Description: 99213 - WOUND CARE VISIT-LEV 3 EST PT Modifier: 25 Quantity: 1 : CPT4 Code: [id number] Description: 11043 - DEB MUSC/FASCIA 20 SQ CM/< ICD-10 Diagnosis Description L89.314 Pressure ulcer of right buttock, stage 4 L89.224 Pressure ulcer of left hip, stage 4 Modifier: Quantity: 1 : CPT4 Code: [id number] Description: 11046 - DEB MUSC/FASCIA EA ADDL 20 CM ICD-10 Diagnosis Description L89.314 Pressure ulcer of right buttock, stage 4 L89.224 Pressure ulcer of left hip, stage 4 Modifier: Quantity: 3 : CPT4 Code: [id number] Description: 97597 - DEBRIDE WOUND 1ST 20 SQ CM OR < ICD-10 Diagnosis Description L89.892 Pressure ulcer of other site, stage 2 Modifier: Quantity: 1 Physician Procedures : CPT4 Code Description Modifier [id number] 99214 - WC PHYS LEVEL 4 - EST PT ICD-10 Diagnosis Description L89.314 Pressure ulcer of right buttock, stage 4 L89.224 Pressure ulcer of left hip, stage 4 L89.892 Pressure ulcer of other site, stage 2 G35 Multiple  sclerosis Quantity: 1 : [id number] 11043 - WC PHYS DEBR MUSCLE/FASCIA 20 SQ CM ICD-10 Diagnosis Description L89.314 Pressure ulcer of right buttock, stage 4 L89.224 Pressure ulcer of left hip, stage 4 Quantity: 1 : [id number] 11046 - WC PHYS DEB MUSC/FASC EA ADDL 20 CM ICD-10 Diagnosis Description L89.314 Pressure  ulcer of right buttock, stage 4 L89.224 Pressure ulcer of left hip, stage 4 Quantity: 3 : [id number] 97597 - WC PHYS DEBR WO ANESTH 20 SQ CM ICD-10 Diagnosis Description L89.892 Pressure ulcer of other site, stage 2 Quantity: 1 Electronic Signature(s) Signed: 01/13/2023 4:21:18 PM By: Duanne Guess MD FACS Previous Signature: 01/13/2023 3:49:47 PM Version By: Kaitlin [name] Entered By: Duanne Guess on 01/13/2023 16:21:18

## 2023-01-17 DIAGNOSIS — L8990 Pressure ulcer of unspecified site, unspecified stage: Secondary | ICD-10-CM | POA: Diagnosis not present

## 2023-01-17 DIAGNOSIS — I129 Hypertensive chronic kidney disease with stage 1 through stage 4 chronic kidney disease, or unspecified chronic kidney disease: Secondary | ICD-10-CM | POA: Diagnosis not present

## 2023-01-17 DIAGNOSIS — F0280 Dementia in other diseases classified elsewhere without behavioral disturbance: Secondary | ICD-10-CM | POA: Diagnosis not present

## 2023-01-17 DIAGNOSIS — I10 Essential (primary) hypertension: Secondary | ICD-10-CM | POA: Diagnosis not present

## 2023-01-17 DIAGNOSIS — I69959 Hemiplegia and hemiparesis following unspecified cerebrovascular disease affecting unspecified side: Secondary | ICD-10-CM | POA: Diagnosis not present

## 2023-01-17 DIAGNOSIS — B9620 Unspecified Escherichia coli [E. coli] as the cause of diseases classified elsewhere: Secondary | ICD-10-CM | POA: Diagnosis not present

## 2023-01-17 DIAGNOSIS — I25119 Atherosclerotic heart disease of native coronary artery with unspecified angina pectoris: Secondary | ICD-10-CM | POA: Diagnosis not present

## 2023-01-17 DIAGNOSIS — N390 Urinary tract infection, site not specified: Secondary | ICD-10-CM | POA: Diagnosis not present

## 2023-01-17 DIAGNOSIS — F39 Unspecified mood [affective] disorder: Secondary | ICD-10-CM | POA: Diagnosis not present

## 2023-01-17 DIAGNOSIS — G9341 Metabolic encephalopathy: Secondary | ICD-10-CM | POA: Diagnosis not present

## 2023-01-17 DIAGNOSIS — N1831 Chronic kidney disease, stage 3a: Secondary | ICD-10-CM | POA: Diagnosis not present

## 2023-01-17 DIAGNOSIS — L89892 Pressure ulcer of other site, stage 2: Secondary | ICD-10-CM | POA: Diagnosis not present

## 2023-01-17 DIAGNOSIS — L89312 Pressure ulcer of right buttock, stage 2: Secondary | ICD-10-CM | POA: Diagnosis not present

## 2023-01-17 DIAGNOSIS — I4891 Unspecified atrial fibrillation: Secondary | ICD-10-CM | POA: Diagnosis not present

## 2023-01-17 DIAGNOSIS — I480 Paroxysmal atrial fibrillation: Secondary | ICD-10-CM | POA: Diagnosis not present

## 2023-01-17 DIAGNOSIS — G35 Multiple sclerosis: Secondary | ICD-10-CM | POA: Diagnosis not present

## 2023-01-17 DIAGNOSIS — E039 Hypothyroidism, unspecified: Secondary | ICD-10-CM | POA: Diagnosis not present

## 2023-01-17 DIAGNOSIS — L89322 Pressure ulcer of left buttock, stage 2: Secondary | ICD-10-CM | POA: Diagnosis not present

## 2023-01-17 DIAGNOSIS — D631 Anemia in chronic kidney disease: Secondary | ICD-10-CM | POA: Diagnosis not present

## 2023-01-17 DIAGNOSIS — E785 Hyperlipidemia, unspecified: Secondary | ICD-10-CM | POA: Diagnosis not present

## 2023-01-18 DIAGNOSIS — L89204 Pressure ulcer of unspecified hip, stage 4: Secondary | ICD-10-CM | POA: Diagnosis not present

## 2023-01-18 DIAGNOSIS — I69351 Hemiplegia and hemiparesis following cerebral infarction affecting right dominant side: Secondary | ICD-10-CM | POA: Diagnosis not present

## 2023-01-18 DIAGNOSIS — G35 Multiple sclerosis: Secondary | ICD-10-CM | POA: Diagnosis not present

## 2023-01-18 DIAGNOSIS — R532 Functional quadriplegia: Secondary | ICD-10-CM | POA: Diagnosis not present

## 2023-01-18 DIAGNOSIS — Z515 Encounter for palliative care: Secondary | ICD-10-CM | POA: Diagnosis not present

## 2023-01-18 DIAGNOSIS — N1831 Chronic kidney disease, stage 3a: Secondary | ICD-10-CM | POA: Diagnosis not present

## 2023-01-18 DIAGNOSIS — L89304 Pressure ulcer of unspecified buttock, stage 4: Secondary | ICD-10-CM | POA: Diagnosis not present

## 2023-01-18 DIAGNOSIS — F0390 Unspecified dementia without behavioral disturbance: Secondary | ICD-10-CM | POA: Diagnosis not present

## 2023-01-18 DIAGNOSIS — I129 Hypertensive chronic kidney disease with stage 1 through stage 4 chronic kidney disease, or unspecified chronic kidney disease: Secondary | ICD-10-CM | POA: Diagnosis not present

## 2023-01-18 DIAGNOSIS — L89892 Pressure ulcer of other site, stage 2: Secondary | ICD-10-CM | POA: Diagnosis not present

## 2023-01-18 DIAGNOSIS — D649 Anemia, unspecified: Secondary | ICD-10-CM | POA: Diagnosis not present

## 2023-01-18 DIAGNOSIS — I480 Paroxysmal atrial fibrillation: Secondary | ICD-10-CM | POA: Diagnosis not present

## 2023-01-18 DIAGNOSIS — Z7902 Long term (current) use of antithrombotics/antiplatelets: Secondary | ICD-10-CM | POA: Diagnosis not present

## 2023-01-18 DIAGNOSIS — Z7901 Long term (current) use of anticoagulants: Secondary | ICD-10-CM | POA: Diagnosis not present

## 2023-01-20 ENCOUNTER — Ambulatory Visit (HOSPITAL_BASED_OUTPATIENT_CLINIC_OR_DEPARTMENT_OTHER): Payer: Medicare HMO | Admitting: General Surgery

## 2023-01-30 DIAGNOSIS — G35 Multiple sclerosis: Secondary | ICD-10-CM | POA: Diagnosis not present

## 2023-01-30 DIAGNOSIS — R269 Unspecified abnormalities of gait and mobility: Secondary | ICD-10-CM | POA: Diagnosis not present

## 2023-01-30 DIAGNOSIS — M6281 Muscle weakness (generalized): Secondary | ICD-10-CM | POA: Diagnosis not present

## 2023-02-01 DIAGNOSIS — G35 Multiple sclerosis: Secondary | ICD-10-CM | POA: Diagnosis not present

## 2023-02-01 DIAGNOSIS — G9341 Metabolic encephalopathy: Secondary | ICD-10-CM | POA: Diagnosis not present

## 2023-02-01 DIAGNOSIS — Z7401 Bed confinement status: Secondary | ICD-10-CM | POA: Diagnosis not present

## 2023-02-01 DIAGNOSIS — L8995 Pressure ulcer of unspecified site, unstageable: Secondary | ICD-10-CM | POA: Diagnosis not present

## 2023-03-01 DIAGNOSIS — R269 Unspecified abnormalities of gait and mobility: Secondary | ICD-10-CM | POA: Diagnosis not present

## 2023-03-01 DIAGNOSIS — G35 Multiple sclerosis: Secondary | ICD-10-CM | POA: Diagnosis not present

## 2023-03-01 DIAGNOSIS — M6281 Muscle weakness (generalized): Secondary | ICD-10-CM | POA: Diagnosis not present

## 2023-03-04 DIAGNOSIS — L8995 Pressure ulcer of unspecified site, unstageable: Secondary | ICD-10-CM | POA: Diagnosis not present

## 2023-03-04 DIAGNOSIS — G9341 Metabolic encephalopathy: Secondary | ICD-10-CM | POA: Diagnosis not present

## 2023-03-04 DIAGNOSIS — Z7401 Bed confinement status: Secondary | ICD-10-CM | POA: Diagnosis not present

## 2023-03-04 DIAGNOSIS — G35 Multiple sclerosis: Secondary | ICD-10-CM | POA: Diagnosis not present

## 2023-03-23 DIAGNOSIS — Z515 Encounter for palliative care: Secondary | ICD-10-CM | POA: Diagnosis not present

## 2023-03-23 DIAGNOSIS — L89891 Pressure ulcer of other site, stage 1: Secondary | ICD-10-CM | POA: Diagnosis not present

## 2023-04-04 DIAGNOSIS — L8995 Pressure ulcer of unspecified site, unstageable: Secondary | ICD-10-CM | POA: Diagnosis not present

## 2023-04-04 DIAGNOSIS — G35 Multiple sclerosis: Secondary | ICD-10-CM | POA: Diagnosis not present

## 2023-04-04 DIAGNOSIS — G9341 Metabolic encephalopathy: Secondary | ICD-10-CM | POA: Diagnosis not present

## 2023-04-04 DIAGNOSIS — Z7401 Bed confinement status: Secondary | ICD-10-CM | POA: Diagnosis not present

## 2023-04-05 DIAGNOSIS — F0390 Unspecified dementia without behavioral disturbance: Secondary | ICD-10-CM | POA: Diagnosis not present

## 2023-04-05 DIAGNOSIS — L89891 Pressure ulcer of other site, stage 1: Secondary | ICD-10-CM | POA: Diagnosis not present

## 2023-04-12 DIAGNOSIS — F0280 Dementia in other diseases classified elsewhere without behavioral disturbance: Secondary | ICD-10-CM | POA: Diagnosis not present

## 2023-04-12 DIAGNOSIS — L89204 Pressure ulcer of unspecified hip, stage 4: Secondary | ICD-10-CM | POA: Diagnosis not present

## 2023-04-12 DIAGNOSIS — Z7902 Long term (current) use of antithrombotics/antiplatelets: Secondary | ICD-10-CM | POA: Diagnosis not present

## 2023-04-12 DIAGNOSIS — G35 Multiple sclerosis: Secondary | ICD-10-CM | POA: Diagnosis not present

## 2023-04-12 DIAGNOSIS — M24569 Contracture, unspecified knee: Secondary | ICD-10-CM | POA: Diagnosis not present

## 2023-04-12 DIAGNOSIS — I480 Paroxysmal atrial fibrillation: Secondary | ICD-10-CM | POA: Diagnosis not present

## 2023-04-12 DIAGNOSIS — Z7901 Long term (current) use of anticoagulants: Secondary | ICD-10-CM | POA: Diagnosis not present

## 2023-04-12 DIAGNOSIS — I69351 Hemiplegia and hemiparesis following cerebral infarction affecting right dominant side: Secondary | ICD-10-CM | POA: Diagnosis not present

## 2023-04-12 DIAGNOSIS — D649 Anemia, unspecified: Secondary | ICD-10-CM | POA: Diagnosis not present

## 2023-04-12 DIAGNOSIS — Z87440 Personal history of urinary (tract) infections: Secondary | ICD-10-CM | POA: Diagnosis not present

## 2023-04-12 DIAGNOSIS — Z515 Encounter for palliative care: Secondary | ICD-10-CM | POA: Diagnosis not present

## 2023-04-12 DIAGNOSIS — R532 Functional quadriplegia: Secondary | ICD-10-CM | POA: Diagnosis not present

## 2023-08-04 DIAGNOSIS — G35 Multiple sclerosis: Secondary | ICD-10-CM | POA: Diagnosis not present

## 2023-08-04 DIAGNOSIS — G9341 Metabolic encephalopathy: Secondary | ICD-10-CM | POA: Diagnosis not present

## 2023-08-04 DIAGNOSIS — L8995 Pressure ulcer of unspecified site, unstageable: Secondary | ICD-10-CM | POA: Diagnosis not present

## 2023-08-04 DIAGNOSIS — Z7401 Bed confinement status: Secondary | ICD-10-CM | POA: Diagnosis not present

## 2023-10-02 DIAGNOSIS — G9341 Metabolic encephalopathy: Secondary | ICD-10-CM | POA: Diagnosis not present

## 2023-10-02 DIAGNOSIS — Z7401 Bed confinement status: Secondary | ICD-10-CM | POA: Diagnosis not present

## 2023-10-02 DIAGNOSIS — G35 Multiple sclerosis: Secondary | ICD-10-CM | POA: Diagnosis not present

## 2023-10-02 DIAGNOSIS — L8995 Pressure ulcer of unspecified site, unstageable: Secondary | ICD-10-CM | POA: Diagnosis not present

## 2024-01-28 ENCOUNTER — Encounter (HOSPITAL_COMMUNITY): Payer: Self-pay | Admitting: Interventional Radiology
